# Patient Record
Sex: Female | Born: 1935
Health system: Southern US, Community
[De-identification: ages and names within clinical notes are randomized; demographics above are authoritative.]

## PROBLEM LIST (undated history)

## (undated) DIAGNOSIS — I255 Ischemic cardiomyopathy: Secondary | ICD-10-CM

## (undated) DIAGNOSIS — Z955 Presence of coronary angioplasty implant and graft: Secondary | ICD-10-CM

## (undated) DIAGNOSIS — I2510 Atherosclerotic heart disease of native coronary artery without angina pectoris: Secondary | ICD-10-CM

## (undated) DIAGNOSIS — I10 Essential (primary) hypertension: Secondary | ICD-10-CM

## (undated) DIAGNOSIS — K219 Gastro-esophageal reflux disease without esophagitis: Secondary | ICD-10-CM

## (undated) DIAGNOSIS — E785 Hyperlipidemia, unspecified: Secondary | ICD-10-CM

## (undated) DIAGNOSIS — K819 Cholecystitis, unspecified: Secondary | ICD-10-CM

## (undated) DIAGNOSIS — M1990 Unspecified osteoarthritis, unspecified site: Secondary | ICD-10-CM

## (undated) DIAGNOSIS — I219 Acute myocardial infarction, unspecified: Secondary | ICD-10-CM

## (undated) DIAGNOSIS — M810 Age-related osteoporosis without current pathological fracture: Secondary | ICD-10-CM

## (undated) HISTORY — DX: Hyperlipidemia, unspecified: E78.5

## (undated) HISTORY — PX: TONSILLECTOMY: SUR1361

## (undated) HISTORY — DX: Atherosclerotic heart disease of native coronary artery without angina pectoris: I25.10

## (undated) HISTORY — PX: APPENDECTOMY: SHX54

## (undated) HISTORY — PX: OTHER SURGICAL HISTORY: SHX169

## (undated) HISTORY — DX: Cholecystitis, unspecified: K81.9

## (undated) HISTORY — PX: LUMBAR LAMINECTOMY/ DECOMPRESSION WITH MET-RX: SHX5959

## (undated) HISTORY — DX: Unspecified osteoarthritis, unspecified site: M19.90

## (undated) HISTORY — PX: VAGINAL HYSTERECTOMY: SUR661

## (undated) HISTORY — DX: Age-related osteoporosis without current pathological fracture: M81.0

---

## 1999-06-20 ENCOUNTER — Encounter: Admission: RE | Admit: 1999-06-20 | Discharge: 1999-06-20 | Payer: Self-pay | Admitting: Internal Medicine

## 1999-06-20 ENCOUNTER — Encounter: Payer: Self-pay | Admitting: Internal Medicine

## 2000-07-21 ENCOUNTER — Ambulatory Visit (HOSPITAL_COMMUNITY): Admission: RE | Admit: 2000-07-21 | Discharge: 2000-07-21 | Payer: Self-pay | Admitting: Internal Medicine

## 2000-07-21 ENCOUNTER — Encounter: Payer: Self-pay | Admitting: Internal Medicine

## 2001-07-05 ENCOUNTER — Encounter: Payer: Self-pay | Admitting: Internal Medicine

## 2001-07-05 ENCOUNTER — Ambulatory Visit (HOSPITAL_COMMUNITY): Admission: RE | Admit: 2001-07-05 | Discharge: 2001-07-05 | Payer: Self-pay | Admitting: Internal Medicine

## 2002-08-30 ENCOUNTER — Ambulatory Visit (HOSPITAL_COMMUNITY): Admission: RE | Admit: 2002-08-30 | Discharge: 2002-08-30 | Payer: Self-pay | Admitting: Internal Medicine

## 2002-08-30 ENCOUNTER — Encounter: Payer: Self-pay | Admitting: Internal Medicine

## 2003-09-12 ENCOUNTER — Ambulatory Visit (HOSPITAL_COMMUNITY): Admission: RE | Admit: 2003-09-12 | Discharge: 2003-09-12 | Payer: Self-pay | Admitting: Internal Medicine

## 2004-09-18 ENCOUNTER — Ambulatory Visit (HOSPITAL_COMMUNITY): Admission: RE | Admit: 2004-09-18 | Discharge: 2004-09-18 | Payer: Self-pay | Admitting: Internal Medicine

## 2005-09-22 ENCOUNTER — Ambulatory Visit (HOSPITAL_COMMUNITY): Admission: RE | Admit: 2005-09-22 | Discharge: 2005-09-22 | Payer: Self-pay | Admitting: Internal Medicine

## 2006-10-21 ENCOUNTER — Ambulatory Visit (HOSPITAL_COMMUNITY): Admission: RE | Admit: 2006-10-21 | Discharge: 2006-10-21 | Payer: Self-pay | Admitting: Internal Medicine

## 2007-10-26 ENCOUNTER — Ambulatory Visit (HOSPITAL_COMMUNITY): Admission: RE | Admit: 2007-10-26 | Discharge: 2007-10-26 | Payer: Self-pay | Admitting: Internal Medicine

## 2008-11-21 ENCOUNTER — Ambulatory Visit (HOSPITAL_COMMUNITY): Admission: RE | Admit: 2008-11-21 | Discharge: 2008-11-21 | Payer: Self-pay | Admitting: Internal Medicine

## 2009-11-22 ENCOUNTER — Ambulatory Visit (HOSPITAL_COMMUNITY): Admission: RE | Admit: 2009-11-22 | Discharge: 2009-11-22 | Payer: Self-pay | Admitting: Internal Medicine

## 2010-11-14 ENCOUNTER — Other Ambulatory Visit (HOSPITAL_COMMUNITY): Payer: Self-pay | Admitting: Internal Medicine

## 2010-11-14 DIAGNOSIS — Z1231 Encounter for screening mammogram for malignant neoplasm of breast: Secondary | ICD-10-CM

## 2010-11-27 ENCOUNTER — Ambulatory Visit (HOSPITAL_COMMUNITY)
Admission: RE | Admit: 2010-11-27 | Discharge: 2010-11-27 | Disposition: A | Discharge status: Home / Self Care | Payer: MEDICARE | Source: Ambulatory Visit | Attending: Internal Medicine | Admitting: Internal Medicine

## 2010-11-27 DIAGNOSIS — Z1231 Encounter for screening mammogram for malignant neoplasm of breast: Secondary | ICD-10-CM | POA: Insufficient documentation

## 2011-11-19 ENCOUNTER — Other Ambulatory Visit (HOSPITAL_COMMUNITY): Payer: Self-pay | Admitting: Internal Medicine

## 2011-11-19 DIAGNOSIS — Z1231 Encounter for screening mammogram for malignant neoplasm of breast: Secondary | ICD-10-CM

## 2011-12-12 ENCOUNTER — Ambulatory Visit (HOSPITAL_COMMUNITY)
Admission: RE | Admit: 2011-12-12 | Discharge: 2011-12-12 | Disposition: A | Discharge status: Home / Self Care | Payer: Medicare Other | Source: Ambulatory Visit | Attending: Internal Medicine | Admitting: Internal Medicine

## 2011-12-12 DIAGNOSIS — Z1231 Encounter for screening mammogram for malignant neoplasm of breast: Secondary | ICD-10-CM | POA: Insufficient documentation

## 2012-12-01 ENCOUNTER — Other Ambulatory Visit (HOSPITAL_COMMUNITY): Payer: Self-pay | Admitting: Internal Medicine

## 2012-12-01 DIAGNOSIS — Z1231 Encounter for screening mammogram for malignant neoplasm of breast: Secondary | ICD-10-CM

## 2012-12-15 ENCOUNTER — Ambulatory Visit (HOSPITAL_COMMUNITY)
Admission: RE | Admit: 2012-12-15 | Discharge: 2012-12-15 | Disposition: A | Discharge status: Home / Self Care | Payer: Medicare HMO | Source: Ambulatory Visit | Attending: Internal Medicine | Admitting: Internal Medicine

## 2012-12-15 DIAGNOSIS — Z1231 Encounter for screening mammogram for malignant neoplasm of breast: Secondary | ICD-10-CM | POA: Insufficient documentation

## 2013-06-28 ENCOUNTER — Ambulatory Visit
Admission: RE | Admit: 2013-06-28 | Discharge: 2013-06-28 | Disposition: A | Discharge status: Home / Self Care | Payer: Medicare HMO | Source: Ambulatory Visit | Attending: Internal Medicine | Admitting: Internal Medicine

## 2013-06-28 ENCOUNTER — Other Ambulatory Visit: Payer: Self-pay | Admitting: Internal Medicine

## 2013-06-28 DIAGNOSIS — M545 Low back pain: Secondary | ICD-10-CM

## 2013-11-18 ENCOUNTER — Other Ambulatory Visit (HOSPITAL_COMMUNITY): Payer: Self-pay | Admitting: Internal Medicine

## 2013-11-18 DIAGNOSIS — Z1231 Encounter for screening mammogram for malignant neoplasm of breast: Secondary | ICD-10-CM

## 2013-12-22 ENCOUNTER — Ambulatory Visit (HOSPITAL_COMMUNITY)
Admission: RE | Admit: 2013-12-22 | Discharge: 2013-12-22 | Disposition: A | Discharge status: Home / Self Care | Payer: Medicare HMO | Source: Ambulatory Visit | Attending: Internal Medicine | Admitting: Internal Medicine

## 2013-12-22 DIAGNOSIS — Z1231 Encounter for screening mammogram for malignant neoplasm of breast: Secondary | ICD-10-CM | POA: Insufficient documentation

## 2015-01-12 ENCOUNTER — Other Ambulatory Visit: Payer: Self-pay | Admitting: Internal Medicine

## 2015-01-12 ENCOUNTER — Other Ambulatory Visit (HOSPITAL_COMMUNITY): Payer: Self-pay | Admitting: Internal Medicine

## 2015-01-12 DIAGNOSIS — E041 Nontoxic single thyroid nodule: Secondary | ICD-10-CM

## 2015-01-12 DIAGNOSIS — Z1231 Encounter for screening mammogram for malignant neoplasm of breast: Secondary | ICD-10-CM

## 2015-01-15 ENCOUNTER — Ambulatory Visit
Admission: RE | Admit: 2015-01-15 | Discharge: 2015-01-15 | Disposition: A | Discharge status: Home / Self Care | Payer: Medicare Other | Source: Ambulatory Visit | Attending: Internal Medicine | Admitting: Internal Medicine

## 2015-01-15 DIAGNOSIS — E041 Nontoxic single thyroid nodule: Secondary | ICD-10-CM

## 2015-01-19 ENCOUNTER — Ambulatory Visit (HOSPITAL_COMMUNITY)
Admission: RE | Admit: 2015-01-19 | Discharge: 2015-01-19 | Disposition: A | Discharge status: Home / Self Care | Payer: Medicare Other | Source: Ambulatory Visit | Attending: Internal Medicine | Admitting: Internal Medicine

## 2015-01-19 DIAGNOSIS — Z1231 Encounter for screening mammogram for malignant neoplasm of breast: Secondary | ICD-10-CM | POA: Diagnosis not present

## 2016-01-02 ENCOUNTER — Other Ambulatory Visit: Payer: Self-pay

## 2016-01-02 DIAGNOSIS — Z1231 Encounter for screening mammogram for malignant neoplasm of breast: Secondary | ICD-10-CM

## 2016-01-24 ENCOUNTER — Other Ambulatory Visit: Payer: Self-pay | Admitting: Internal Medicine

## 2016-01-24 DIAGNOSIS — E049 Nontoxic goiter, unspecified: Secondary | ICD-10-CM

## 2016-01-28 ENCOUNTER — Ambulatory Visit
Admission: RE | Admit: 2016-01-28 | Discharge: 2016-01-28 | Disposition: A | Discharge status: Home / Self Care | Payer: Medicare Other | Source: Ambulatory Visit | Attending: Internal Medicine | Admitting: Internal Medicine

## 2016-01-28 DIAGNOSIS — E049 Nontoxic goiter, unspecified: Secondary | ICD-10-CM

## 2016-02-11 ENCOUNTER — Ambulatory Visit: Payer: Medicare Other

## 2016-03-06 ENCOUNTER — Ambulatory Visit
Admission: RE | Admit: 2016-03-06 | Discharge: 2016-03-06 | Disposition: A | Discharge status: Home / Self Care | Payer: Medicare Other | Source: Ambulatory Visit

## 2016-03-06 DIAGNOSIS — Z1231 Encounter for screening mammogram for malignant neoplasm of breast: Secondary | ICD-10-CM

## 2017-02-24 ENCOUNTER — Other Ambulatory Visit: Payer: Self-pay | Admitting: Internal Medicine

## 2017-02-24 DIAGNOSIS — R634 Abnormal weight loss: Secondary | ICD-10-CM

## 2017-02-26 ENCOUNTER — Other Ambulatory Visit: Payer: Self-pay | Admitting: Internal Medicine

## 2017-02-26 DIAGNOSIS — Z1231 Encounter for screening mammogram for malignant neoplasm of breast: Secondary | ICD-10-CM

## 2017-03-02 ENCOUNTER — Ambulatory Visit
Admission: RE | Admit: 2017-03-02 | Discharge: 2017-03-02 | Disposition: A | Discharge status: Home / Self Care | Payer: Medicare Other | Source: Ambulatory Visit | Attending: Internal Medicine | Admitting: Internal Medicine

## 2017-03-02 DIAGNOSIS — R634 Abnormal weight loss: Secondary | ICD-10-CM

## 2017-03-02 MED ORDER — IOPAMIDOL (ISOVUE-300) INJECTION 61%
100.0000 mL | Freq: Once | INTRAVENOUS | Status: AC | PRN
  Administered 2017-03-02: 100 mL via INTRAVENOUS

## 2017-03-04 ENCOUNTER — Other Ambulatory Visit: Payer: Self-pay | Admitting: Internal Medicine

## 2017-03-04 DIAGNOSIS — R935 Abnormal findings on diagnostic imaging of other abdominal regions, including retroperitoneum: Secondary | ICD-10-CM

## 2017-03-10 ENCOUNTER — Ambulatory Visit
Admission: RE | Admit: 2017-03-10 | Discharge: 2017-03-10 | Disposition: A | Discharge status: Home / Self Care | Payer: Medicare Other | Source: Ambulatory Visit | Attending: Internal Medicine | Admitting: Internal Medicine

## 2017-03-10 DIAGNOSIS — R935 Abnormal findings on diagnostic imaging of other abdominal regions, including retroperitoneum: Secondary | ICD-10-CM

## 2017-03-11 DIAGNOSIS — Z955 Presence of coronary angioplasty implant and graft: Secondary | ICD-10-CM

## 2017-03-11 HISTORY — DX: Presence of coronary angioplasty implant and graft: Z95.5

## 2017-03-11 HISTORY — PX: OTHER SURGICAL HISTORY: SHX169

## 2017-03-17 DIAGNOSIS — I219 Acute myocardial infarction, unspecified: Secondary | ICD-10-CM

## 2017-03-17 HISTORY — DX: Acute myocardial infarction, unspecified: I21.9

## 2017-03-24 ENCOUNTER — Ambulatory Visit: Payer: Medicare Other

## 2017-03-31 ENCOUNTER — Ambulatory Visit (INDEPENDENT_AMBULATORY_CARE_PROVIDER_SITE_OTHER): Payer: Medicare Other | Admitting: Physician Assistant

## 2017-03-31 ENCOUNTER — Encounter: Payer: Self-pay | Admitting: Physician Assistant

## 2017-03-31 ENCOUNTER — Encounter (INDEPENDENT_AMBULATORY_CARE_PROVIDER_SITE_OTHER): Payer: Self-pay

## 2017-03-31 DIAGNOSIS — I255 Ischemic cardiomyopathy: Secondary | ICD-10-CM | POA: Insufficient documentation

## 2017-03-31 DIAGNOSIS — M1990 Unspecified osteoarthritis, unspecified site: Secondary | ICD-10-CM | POA: Insufficient documentation

## 2017-03-31 DIAGNOSIS — I2510 Atherosclerotic heart disease of native coronary artery without angina pectoris: Secondary | ICD-10-CM | POA: Diagnosis not present

## 2017-03-31 DIAGNOSIS — E785 Hyperlipidemia, unspecified: Secondary | ICD-10-CM | POA: Insufficient documentation

## 2017-03-31 DIAGNOSIS — M810 Age-related osteoporosis without current pathological fracture: Secondary | ICD-10-CM | POA: Insufficient documentation

## 2017-03-31 MED ORDER — LOSARTAN POTASSIUM 25 MG PO TABS: 25.0000 mg | ORAL_TABLET | Freq: Every day | ORAL | 3 refills | Status: DC

## 2017-03-31 MED ORDER — TICAGRELOR 90 MG PO TABS: 90.0000 mg | ORAL_TABLET | Freq: Two times a day (BID) | ORAL | 3 refills | Status: DC

## 2017-03-31 MED ORDER — METOPROLOL SUCCINATE ER 50 MG PO TB24
50.0000 mg | ORAL_TABLET | Freq: Every day | ORAL | 3 refills | Status: DC
Start: 2017-03-31 — End: 2018-01-23

## 2017-03-31 MED ORDER — ATORVASTATIN CALCIUM 80 MG PO TABS: 80.0000 mg | ORAL_TABLET | Freq: Every day | ORAL | 3 refills | Status: DC

## 2017-03-31 MED ORDER — NITROGLYCERIN 0.4 MG SL SUBL: 0.4000 mg | SUBLINGUAL_TABLET | SUBLINGUAL | 3 refills | Status: DC | PRN

## 2017-03-31 NOTE — Patient Instructions (Signed)
Medication Instructions:  Your physician recommends that you continue on your current medications as directed. Please refer to the Current Medication list given to you today.   Labwork: TODAY: BMET  Fasting labs in 6 weeks: lipids, liver  Testing/Procedures: It is requested that you have an echocardiogram MID SEPTEMBER. Echocardiography is a painless test that uses sound waves to create images of your heart. It provides your doctor with information about the size and shape of your heart and how well your heart's chambers and valves are working. This procedure takes approximately one hour. There are no restrictions for this procedure.   Follow-Up: You have been referred to CARDIAC REHAB.  Elon Jester recommends that you schedule a follow-up appointment in 2 months with Dr. Clifton James.  Any Other Special Instructions Will Be Listed Below (If Applicable).     If you need a refill on your cardiac medications before your next appointment, please call your pharmacy.

## 2017-03-31 NOTE — Progress Notes (Signed)
Cardiology Office Note    Date:  03/31/2017   ID:  Carrie Barnes, DOB 1936/07/05, MRN [id number]  PCP:  Renford Dills, MD  Cardiologist: New  Chief Complaint  Patient presents with  . New Patient (Initial Visit)    History of Present Illness:    Carrie Barnes is a 81 y.o. female who is being seen today for the evaluation of CAD at the request of Dr. Octavio Manns.  Patient had a NSTEMI with PCI/DES of an 80% LAD 03/17/17 [facility]. Review of cath report showed no residual disease in the circumflex or RCA with moderate LV dysfunction EF 35-40%. She was discharged home on aspirin, related to, most certain, metoprolol and statin. Discharge creatinine was 0.81. She has a past medical history of hyperlipidemia, osteoporosis and DJD.  Patient comes in today accompanied by her son who is a psychiatric nurse here in town. Her daughter is also a Engineer, civil (consulting) at [employer]. Patient has done quite well since her MI without any chest pain, dyspnea, dyspnea on exertion, dizziness or presyncope. She is asking much exercise she can do wonder if she should do cardiac rehabilitation. She actually feels like she is breathing easier and didn't realize she was having a problem before. She has had no symptoms of heart failure. She has no trouble with bleeding or melena. She quit smoking 35 years ago and her father had an MI at age 70. She lives independently and cooks for her family every Sunday.   Past Medical History:  Diagnosis Date  . Coronary artery disease   . DJD (degenerative joint disease)   . Hyperlipidemia   . Osteoporosis     History reviewed. No pertinent surgical history.  Current Medications: Current Meds  Medication Sig  . alendronate (FOSAMAX) 5 MG tablet Take 5 mg by mouth once a week. Take in the morning with a full glass of water, on an empty stomach, and do not take anything else by mouth or lie down for the next 30 min.  Marland Kitchen aspirin EC 81 MG tablet Take 1 tablet  (81 mg total) by mouth daily.  Marland Kitchen atorvastatin (LIPITOR) 80 MG tablet Take 80 mg by mouth daily.   Marland Kitchen losartan (COZAAR) 25 MG tablet Take 1 tablet (25 mg total) by mouth daily.  . metoprolol succinate (TOPROL-XL) 50 MG 24 hr tablet Take 50 mg by mouth daily.   . Multiple Vitamin (MULTI-VITAMINS) TABS Take 1 tablet by mouth daily.  . nitroGLYCERIN (NITROSTAT) 0.4 MG SL tablet   . Omega-3 Fatty Acids (FISH OIL) 1200 MG CAPS Take 1,200 mg by mouth daily.  Marland Kitchen OMEPRAZOLE PO Take 20.6 mg by mouth daily.  . ticagrelor (BRILINTA) 90 MG TABS tablet Take 1 tablet (90 mg total) by mouth every 12 (twelve) hours.     Allergies:   Patient has no known allergies.   Social History   Social History  . Marital status: Widowed    Spouse name: N/A  . Number of children: N/A  . Years of education: N/A   Social History Main Topics  . Smoking status: Former Games developer  . Smokeless tobacco: Never Used  . Alcohol use None  . Drug use: Unknown  . Sexual activity: Not Asked   Other Topics Concern  . None   Social History Narrative  . None     Family History:  The patient's   family history includes Breast cancer in her sister; Heart disease in her father; Pancreatic cancer  in her sister.   ROS:   Please see the history of present illness.    Review of Systems  Constitution: Negative.  HENT: Negative.   Eyes: Negative.   Cardiovascular: Negative.   Respiratory: Negative.   Hematologic/Lymphatic: Negative.   Musculoskeletal: Positive for arthritis. Negative for joint pain.  Gastrointestinal: Negative.   Genitourinary: Negative.   Neurological: Negative.    All other systems reviewed and are negative.   PHYSICAL EXAM:   VS:  BP 110/60   Pulse 64   Ht 5' 1.5" (1.562 m)   Wt 147 lb 3.2 oz (66.8 kg)   BMI 27.36 kg/m   Physical Exam  GEN: Well nourished, well developed, in no acute distress  Neck: no JVD, carotid bruits, or masses Cardiac:RRR; 1/6 systolic murmur at the left sternal  border Respiratory:  clear to auscultation bilaterally, normal work of breathing GI: soft, nontender, nondistended, + BS Ext: Right arm at cath site without hematoma or hemorrhage lower extremities without cyanosis, clubbing, or edema, Good distal pulses bilaterally Neuro:  Alert and Oriented x 3 Psych: euthymic mood, full affect  Wt Readings from Last 3 Encounters:  03/31/17 147 lb 3.2 oz (66.8 kg)      Studies/Labs Reviewed:   EKG:  EKG is  ordered today.  The ekg ordered today demonstrates Normal sinus rhythm with T wave inversion inferior and anterior lateral  Recent Labs: No results found for requested labs within last 8760 hours.   Lipid Panel No results found for: CHOL, TRIG, HDL, CHOLHDL, VLDL, LDLCALC, LDLDIRECT  Additional studies/ records that were reviewed today include:   Cardiac catheterization 03/17/17 [facility] SELECTIVE CORONARY ARTERIOGRAPHY: A) The right coronary is dominant, with a PDA and a large trifurcating PLV branch. The RCA is free of significant disease.    B) The left main coronary arises above the left coronary cusp the aortic valve and trifurcates left anterior descending, ramus intermedius, and circumflex. The left main is normal in caliber and free of disease.    C) The left anterior descending coronary is a modest vessel which reaches the cardiac apex. There is a 30% proximal LAD stenosis. There is a single moderate bifurcating proximal diagonal, and distal to this diagonal, there is a hazy 80 to 90% LAD stenosis. The remainder of the LAD has minor plaquing but no obstructive disease.    C) The circumflex consists of a small ramus intermedius, a large bifurcating OM1, and a moderate PLV branch. The circumflex is free of disease.   LEFT VENTRICULOGRAPHY: The left ventricle is normal in size, with a large area of anteroapical hypokinesis. The estimated LVEF is 35-40%. There is no MR noted on hand injection.   PERCUTANEOUS CORONARY  INTERVENTION AND DRUG-ELUTING STENT IMPLANTATION OF THE MID LEFT ANTERIOR DESCENDING: Following diagnostic catheterization, a 6-French sheath was used to replace the 5-French sheath, and this was aspirated and flushed. Heparin was given to maintain an ACT of greater than 300 seconds. A 0.014 Intuition wire was placed into the diagonal, and a 0.014 Whisper MS wire placed into the distal LAD. The vessel was pre-dilated with a 2.5 mm balloon, and then a 2.75 x 16 mm Promus Premier stent was positioned cineangiographically and deployed at 14 atmospheres for 30 seconds. Afterwards, there was no residual stenosis in the LAD with TIMI 3 antegrade blood flow. Final arteriograms were performed, the guiding catheter removed over a J-wire. The patient was transported to the recovery area in stable condition.   CONCLUSIONS:  1. Moderately reduced left ventricular systolic function as noted above. 2. Elevated left ventricular end-diastolic pressure (LVEDP). 3. Significant single-vessel coronary artery disease involving the mid left anterior descending, with no significant disease in the left circumflex or right coronary artery. 4. Successful percutaneous coronary intervention and drug-eluting stent implantation of the mid left anterior descending, with no residual stenosis following treatment and TIMI 3 blood flow pre and postprocedurally.     Dictated By: Octavio Manns, M.D.   ASSESSMENT:    1. CAD in native artery   2. Ischemic cardiomyopathy   3. Hyperlipidemia, unspecified hyperlipidemia type      PLAN:  In order of problems listed above:  CAD status post NSTEMI 03/17/17 [facility] treated with DES to the LAD. Normal RCA and circumflex. EF 35-40%. On Brilinta,aspirin, Beta blocker And statin. Overall doing very well. We'll refer to cardiac rehabilitation. Schedule follow-up with Dr.McAlhany in 2 months to be established long term.  Ischemic cardiomyopathy ejection fraction 35-40%.  Recommend follow-up echo in September. I suspect her EF is better than this. Continue losartan. Check renal function today.   Hyperlipidemia on statin will follow-up lipids and LFTs in 6 weeks.    Medication Adjustments/Labs and Tests Ordered: Current medicines are reviewed at length with the patient today.  Concerns regarding medicines are outlined above.  Medication changes, Labs and Tests ordered today are listed in the Patient Instructions below. There are no Patient Instructions on file for this visit.   Elson Clan, PA-C  03/31/2017 11:42 AM    [facility] [address] [place], [place], [place]  [address] Phone: [phone]; Fax: [phone]

## 2017-04-01 LAB — BASIC METABOLIC PANEL
BUN/Creatinine Ratio: 15 (ref 12–28)
BUN: 11 mg/dL (ref 8–27)
CO2: 27 mmol/L (ref 20–29)
Calcium: 9.9 mg/dL (ref 8.7–10.3)
Chloride: 95 mmol/L — ABNORMAL LOW (ref 96–106)
Creatinine, Ser: 0.73 mg/dL (ref 0.57–1.00)
GFR calc Af Amer: 91 mL/min/{1.73_m2} (ref >59)
GFR calc non Af Amer: 79 mL/min/{1.73_m2} (ref >59)
Glucose: 89 mg/dL (ref 65–99)
Potassium: 5.1 mmol/L (ref 3.5–5.2)
Sodium: 140 mmol/L (ref 134–144)

## 2017-04-08 ENCOUNTER — Telehealth: Payer: Self-pay | Admitting: Physician Assistant

## 2017-04-08 NOTE — Telephone Encounter (Signed)
New message      Pt was seen last week.  She still has not heard from cardiac rehab.  Calling to make sure we sent the referral to them.  Please call

## 2017-04-09 NOTE — Telephone Encounter (Signed)
Returned pts call and assured her that the referral for Cardiac Rehab has been placed.  I did provide pt with the phone # to call if she would like. She thanked me for the return call and said that she would give them call.

## 2017-04-15 ENCOUNTER — Telehealth (HOSPITAL_COMMUNITY): Payer: Self-pay | Admitting: Pharmacy Technician

## 2017-04-15 ENCOUNTER — Telehealth (HOSPITAL_COMMUNITY): Payer: Self-pay

## 2017-04-15 NOTE — Telephone Encounter (Signed)
Cardiac Rehab Medication Review by a Pharmacist  Does the patient  feel that his/her medications are working for him/her?  yes  Has the patient been experiencing any side effects to the medications prescribed?  no  Does the patient measure his/her own blood pressure or blood glucose at home?  no   Does the patient have any problems obtaining medications due to transportation or finances?   no  Understanding of regimen: good Understanding of indications: fair Potential of compliance: good    Pharmacist comments: Patient understands her medication regimen and was able to update many over the counter medications that she currently takes.  She does not currently monitor her BP at home but was favorable to starting.  She has no issues with affording her medications at this time, but did state the cost of Brilinta did impact her financially; She states being able to afford Brilinta for as long as it is indicated.   Daylene Posey, PharmD Pharmacy Resident Pager #: [phone] 04/15/2017 4:58 PM

## 2017-04-15 NOTE — Telephone Encounter (Signed)
Patient insurance is active and benefits verified. Patient insurance is [facility]- $20.00 co-payment, no deductible, out of pocket $6700/$520.59 has been met, no co-insurance and no pre-authorization. Passport/reference [phone].

## 2017-04-20 ENCOUNTER — Ambulatory Visit: Payer: Medicare Other | Admitting: Cardiovascular Disease

## 2017-04-21 ENCOUNTER — Ambulatory Visit
Admission: RE | Admit: 2017-04-21 | Discharge: 2017-04-21 | Disposition: A | Discharge status: Home / Self Care | Payer: Medicare Other | Source: Ambulatory Visit | Attending: Internal Medicine | Admitting: Internal Medicine

## 2017-04-21 DIAGNOSIS — Z1231 Encounter for screening mammogram for malignant neoplasm of breast: Secondary | ICD-10-CM

## 2017-04-23 ENCOUNTER — Encounter (HOSPITAL_COMMUNITY)
Admission: RE | Admit: 2017-04-23 | Discharge: 2017-04-23 | Disposition: A | Discharge status: Home / Self Care | Payer: Medicare Other | Source: Ambulatory Visit | Attending: Cardiovascular Disease | Admitting: Cardiovascular Disease

## 2017-04-23 ENCOUNTER — Encounter (HOSPITAL_COMMUNITY): Payer: Self-pay

## 2017-04-23 VITALS — BP 116/60 | HR 65 | Ht 63.0 in | Wt 139.6 lb

## 2017-04-23 DIAGNOSIS — Z955 Presence of coronary angioplasty implant and graft: Secondary | ICD-10-CM | POA: Diagnosis present

## 2017-04-23 DIAGNOSIS — Z79899 Other long term (current) drug therapy: Secondary | ICD-10-CM | POA: Insufficient documentation

## 2017-04-23 DIAGNOSIS — M810 Age-related osteoporosis without current pathological fracture: Secondary | ICD-10-CM | POA: Insufficient documentation

## 2017-04-23 DIAGNOSIS — I2510 Atherosclerotic heart disease of native coronary artery without angina pectoris: Secondary | ICD-10-CM | POA: Diagnosis not present

## 2017-04-23 DIAGNOSIS — I214 Non-ST elevation (NSTEMI) myocardial infarction: Secondary | ICD-10-CM | POA: Diagnosis present

## 2017-04-23 DIAGNOSIS — E785 Hyperlipidemia, unspecified: Secondary | ICD-10-CM | POA: Diagnosis not present

## 2017-04-23 DIAGNOSIS — Z87891 Personal history of nicotine dependence: Secondary | ICD-10-CM | POA: Diagnosis not present

## 2017-04-23 DIAGNOSIS — Z7982 Long term (current) use of aspirin: Secondary | ICD-10-CM | POA: Diagnosis not present

## 2017-04-23 NOTE — Progress Notes (Signed)
Carrie Barnes 81 y.o. female DOB: [birth year] MRN: [id number]      Nutrition Note  1. NSTEMI (non-ST elevated myocardial infarction) (HCC)   2. Status post coronary artery stent placement    Past Medical History:  Diagnosis Date  . Coronary artery disease   . DJD (degenerative joint disease)   . Hyperlipidemia   . Osteoporosis    Meds reviewed.  HT: Ht Readings from Last 1 Encounters:  03/31/17 5' 1.5" (1.562 m)    WT: Wt Readings from Last 3 Encounters:  03/31/17 147 lb 3.2 oz (66.8 kg)     BMI 27.4   Current tobacco use? No  Labs:  Lipid Panel  03/17/17 Labs Total chol  169 Trig   308 HDL   44 LDL   63 Hemoglobin A1c  5.5  CBG (last 3)  No results for input(s): GLUCAP in the last 72 hours.  Nutrition Note Spoke with pt. Nutrition plan and goals reviewed with pt. Pt is following Step 2 of the Therapeutic Lifestyle Changes diet. Pt wants to lose wt. Pt has been trying to lose wt by using the app LoseIt! app. Pt reports she has achieved her goal wt of 140 lb. Pt expressed understanding of the information reviewed. Pt aware of nutrition education classes offered.  Nutrition Diagnosis ? Food-and nutrition-related knowledge deficit related to lack of exposure to information as related to diagnosis of: ? CVD ? Overweight related to excessive energy intake as evidenced by a BMI of 27.4  Nutrition Intervention ? Pt's individual nutrition plan and goals reviewed with pt. ? Pt given handouts for: ? Nutrition I class ? Nutrition II class   Nutrition Goal(s):  ? Pt to describe the benefit of including fruits, vegetables, whole grains, and low-fat dairy products in a heart healthy meal plan.  Plan:  Will provide client-centered nutrition education as part of interdisciplinary care.   Monitor and evaluate progress toward nutrition goal with team.  Mickle Plumb, M.Ed, RD, LDN, CDE 04/23/2017 10:21 AM

## 2017-04-23 NOTE — Progress Notes (Signed)
Cardiac Individual Treatment Plan  Patient Details  Name: Carrie Barnes MRN: [id number] Date of Birth: 12-Dec-1935 Referring Provider:     CARDIAC REHAB PHASE II ORIENTATION from 04/23/2017 in [facility] CARDIAC REHAB  Referring Provider  Earney Hamburg MD      Initial Encounter Date:    CARDIAC REHAB PHASE II ORIENTATION from 04/23/2017 in [facility] CARDIAC REHAB  Date  04/23/17  Referring Provider  Earney Hamburg MD      Visit Diagnosis: NSTEMI (non-ST elevated myocardial infarction) [facility])  Status post coronary artery stent placement  Patient's Home Medications on Admission:  Current Outpatient Prescriptions:  .  alendronate (FOSAMAX) 70 MG tablet, Take 70 mg by mouth once a week. Take in the morning with a full glass of water, on an empty stomach, and do not take anything else by mouth or lie down for the next 30 min., Disp: , Rfl:  .  aspirin EC 81 MG tablet, Take 1 tablet (81 mg total) by mouth daily., Disp: , Rfl:  .  atorvastatin (LIPITOR) 80 MG tablet, Take 1 tablet (80 mg total) by mouth daily., Disp: 90 tablet, Rfl: 3 .  calcium carbonate (CALCIUM 600) 600 MG TABS tablet, Take 1,200 mg by mouth 2 (two) times daily with a meal., Disp: , Rfl:  .  cetirizine (ZYRTEC) 10 MG tablet, Take 10 mg by mouth daily as needed for allergies. , Disp: , Rfl:  .  Cholecalciferol (VITAMIN D3) 2000 units capsule, Take 2,000 Units by mouth daily., Disp: , Rfl:  .  CRANBERRY EXTRACT PO, Take 1 capsule by mouth daily., Disp: , Rfl:  .  losartan (COZAAR) 25 MG tablet, Take 1 tablet (25 mg total) by mouth daily., Disp: 90 tablet, Rfl: 3 .  metoprolol succinate (TOPROL-XL) 50 MG 24 hr tablet, Take 1 tablet (50 mg total) by mouth daily., Disp: 90 tablet, Rfl: 3 .  Multiple Vitamin (MULTI-VITAMINS) TABS, Take 1 tablet by mouth daily., Disp: , Rfl:  .  nitroGLYCERIN (NITROSTAT) 0.4 MG SL tablet, Place 1 tablet (0.4 mg total) under the tongue every 5 (five)  minutes as needed for chest pain., Disp: 25 tablet, Rfl: 3 .  Omega-3 Fatty Acids (FISH OIL) 1200 MG CAPS, Take 1,200 mg by mouth daily., Disp: , Rfl:  .  omeprazole (PRILOSEC) 20 MG capsule, Take 20 mg by mouth daily., Disp: , Rfl:  .  ticagrelor (BRILINTA) 90 MG TABS tablet, Take 1 tablet (90 mg total) by mouth 2 (two) times daily., Disp: 180 tablet, Rfl: 3 .  vitamin C (ASCORBIC ACID) 500 MG tablet, Take 500 mg by mouth daily., Disp: , Rfl:   Past Medical History: Past Medical History:  Diagnosis Date  . Coronary artery disease   . DJD (degenerative joint disease)   . Hyperlipidemia   . Osteoporosis     Tobacco Use: History  Smoking Status  . Former Smoker  Smokeless Tobacco  . Never Used    Labs: Recent Review Flowsheet Data    There is no flowsheet data to display.      Capillary Blood Glucose: No results found for: GLUCAP   Exercise Target Goals: Date: 04/23/17  Exercise Program Goal: Individual exercise prescription set with THRR, safety & activity barriers. Participant demonstrates ability to understand and report RPE using BORG scale, to self-measure pulse accurately, and to acknowledge the importance of the exercise prescription.  Exercise Prescription Goal: Starting with aerobic activity 30 plus minutes a day, 3 days per  week for initial exercise prescription. Provide home exercise prescription and guidelines that participant acknowledges understanding prior to discharge.  Activity Barriers & Risk Stratification:     Activity Barriers & Cardiac Risk Stratification - 04/23/17 0926      Activity Barriers & Cardiac Risk Stratification   Activity Barriers Deconditioning;Muscular Weakness;Back Problems   Cardiac Risk Stratification High      6 Minute Walk:     6 Minute Walk    Row Name 04/23/17 1217         6 Minute Walk   Phase Initial     Distance 1400 feet     Walk Time 6 minutes     # of Rest Breaks 0     METS 2.6     RPE 10     VO2 Peak  9.16     Symptoms No     Resting HR 65 bpm     Resting BP 116/60     Resting Oxygen Saturation  98 %     Exercise Oxygen Saturation  during 6 min walk 99 %     Max Ex. HR 94 bpm     Max Ex. BP 135/70     2 Minute Post BP 114/70        Oxygen Initial Assessment:   Oxygen Re-Evaluation:   Oxygen Discharge (Final Oxygen Re-Evaluation):   Initial Exercise Prescription:     Initial Exercise Prescription - 04/23/17 1200      Date of Initial Exercise RX and Referring Provider   Date 04/23/17   Referring Provider Earney Hamburg MD     Treadmill   MPH 2   Grade 1   Minutes 10   METs 2.81     Recumbant Bike   Level 1.5   Minutes 10   METs 2.3     NuStep   Level 2   SPM 70   Minutes 10   METs 2     Prescription Details   Frequency (times per week) 3   Duration Progress to 30 minutes of continuous aerobic without signs/symptoms of physical distress     Intensity   THRR 40-80% of Max Heartrate 56-113   Ratings of Perceived Exertion 11-13   Perceived Dyspnea 0-4     Progression   Progression Continue to progress workloads to maintain intensity without signs/symptoms of physical distress.     Resistance Training   Training Prescription Yes   Weight 2lbs   Reps 10-15      Perform Capillary Blood Glucose checks as needed.  Exercise Prescription Changes:   Exercise Comments:   Exercise Goals and Review:     Exercise Goals    Row Name 04/23/17 0933 04/23/17 1239           Exercise Goals   Increase Physical Activity Yes  -      Intervention Provide advice, education, support and counseling about physical activity/exercise needs.;Develop an individualized exercise prescription for aerobic and resistive training based on initial evaluation findings, risk stratification, comorbidities and participant's personal goals.  -      Expected Outcomes Achievement of increased cardiorespiratory fitness and enhanced flexibility, muscular endurance and strength  shown through measurements of functional capacity and personal statement of participant.  -      Increase Strength and Stamina Yes -  increase muscle tone, balance and flexibility      Intervention Provide advice, education, support and counseling about physical activity/exercise needs.;Develop an individualized exercise prescription for aerobic and resistive  training based on initial evaluation findings, risk stratification, comorbidities and participant's personal goals.  -      Expected Outcomes Achievement of increased cardiorespiratory fitness and enhanced flexibility, muscular endurance and strength shown through measurements of functional capacity and personal statement of participant.  -      Able to understand and use rate of perceived exertion (RPE) scale Yes  -      Intervention Provide education and explanation on how to use RPE scale  -      Expected Outcomes Short Term: Able to use RPE daily in rehab to express subjective intensity level;Long Term:  Able to use RPE to guide intensity level when exercising independently  -      Knowledge and understanding of Target Heart Rate Range (THRR) Yes  -      Intervention Provide education and explanation of THRR including how the numbers were predicted and where they are located for reference  -      Expected Outcomes Short Term: Able to state/look up THRR;Long Term: Able to use THRR to govern intensity when exercising independently;Short Term: Able to use daily as guideline for intensity in rehab  -      Able to check pulse independently Yes  -      Intervention Provide education and demonstration on how to check pulse in carotid and radial arteries.;Review the importance of being able to check your own pulse for safety during independent exercise  -      Expected Outcomes Short Term: Able to explain why pulse checking is important during independent exercise;Long Term: Able to check pulse independently and accurately  -      Understanding of  Exercise Prescription Yes  -      Intervention Provide education, explanation, and written materials on patient's individual exercise prescription  -      Expected Outcomes Short Term: Able to explain program exercise prescription;Long Term: Able to explain home exercise prescription to exercise independently  -         Exercise Goals Re-Evaluation :    Discharge Exercise Prescription (Final Exercise Prescription Changes):   Nutrition:  Target Goals: Understanding of nutrition guidelines, daily intake of sodium 1500mg , cholesterol 200mg , calories 30% from fat and 7% or less from saturated fats, daily to have 5 or more servings of fruits and vegetables.  Biometrics:     Pre Biometrics - 04/23/17 1223      Pre Biometrics   Waist Circumference 35.5 inches   Hip Circumference 39 inches   Waist to Hip Ratio 0.91 %   Triceps Skinfold 30 mm   % Body Fat 39 %   Grip Strength 22 kg   Flexibility 7.5 in   Single Leg Stand 8 seconds       Nutrition Therapy Plan and Nutrition Goals:     Nutrition Therapy & Goals - 04/23/17 1025      Nutrition Therapy   Diet Therapeutic Lifestyle Changes     Personal Nutrition Goals   Nutrition Goal Pt to describe the benefit of including fruits, vegetables, whole grains, and low-fat dairy products in a heart healthy meal plan.     Intervention Plan   Intervention Prescribe, educate and counsel regarding individualized specific dietary modifications aiming towards targeted core components such as weight, hypertension, lipid management, diabetes, heart failure and other comorbidities.   Expected Outcomes Short Term Goal: Understand basic principles of dietary content, such as calories, fat, sodium, cholesterol and nutrients.;Long Term Goal: Adherence to prescribed  nutrition plan.      Nutrition Discharge: Nutrition Scores:   Nutrition Goals Re-Evaluation:   Nutrition Goals Re-Evaluation:   Nutrition Goals Discharge (Final Nutrition  Goals Re-Evaluation):   Psychosocial: Target Goals: Acknowledge presence or absence of significant depression and/or stress, maximize coping skills, provide positive support system. Participant is able to verbalize types and ability to use techniques and skills needed for reducing stress and depression.  Initial Review & Psychosocial Screening:   Quality of Life Scores:     Quality of Life - 04/23/17 1225      Quality of Life Scores   Health/Function Pre 27.89 %   Socioeconomic Pre 27.71 %   Psych/Spiritual Pre 28.93 %   Family Pre 30 %   GLOBAL Pre 28.34 %      PHQ-9: Recent Review Flowsheet Data    There is no flowsheet data to display.     Interpretation of Total Score  Total Score Depression Severity:  1-4 = Minimal depression, 5-9 = Mild depression, 10-14 = Moderate depression, 15-19 = Moderately severe depression, 20-27 = Severe depression   Psychosocial Evaluation and Intervention:   Psychosocial Re-Evaluation:   Psychosocial Discharge (Final Psychosocial Re-Evaluation):   Vocational Rehabilitation: Provide vocational rehab assistance to qualifying candidates.   Vocational Rehab Evaluation & Intervention:   Education: Education Goals: Education classes will be provided on a weekly basis, covering required topics. Participant will state understanding/return demonstration of topics presented.  Learning Barriers/Preferences:     Learning Barriers/Preferences - 04/23/17 0919      Learning Barriers/Preferences   Learning Barriers Sight   Learning Preferences Computer/Internet;Written Material      Education Topics: Count Your Pulse:  -Group instruction provided by verbal instruction, demonstration, patient participation and written materials to support subject.  Instructors address importance of being able to find your pulse and how to count your pulse when at home without a heart monitor.  Patients get hands on experience counting their pulse with  staff help and individually.   Heart Attack, Angina, and Risk Factor Modification:  -Group instruction provided by verbal instruction, video, and written materials to support subject.  Instructors address signs and symptoms of angina and heart attacks.    Also discuss risk factors for heart disease and how to make changes to improve heart health risk factors.   Functional Fitness:  -Group instruction provided by verbal instruction, demonstration, patient participation, and written materials to support subject.  Instructors address safety measures for doing things around the house.  Discuss how to get up and down off the floor, how to pick things up properly, how to safely get out of a chair without assistance, and balance training.   Meditation and Mindfulness:  -Group instruction provided by verbal instruction, patient participation, and written materials to support subject.  Instructor addresses importance of mindfulness and meditation practice to help reduce stress and improve awareness.  Instructor also leads participants through a meditation exercise.    Stretching for Flexibility and Mobility:  -Group instruction provided by verbal instruction, patient participation, and written materials to support subject.  Instructors lead participants through series of stretches that are designed to increase flexibility thus improving mobility.  These stretches are additional exercise for major muscle groups that are typically performed during regular warm up and cool down.   Hands Only CPR:  -Group verbal, video, and participation provides a basic overview of AHA guidelines for community CPR. Role-play of emergencies allow participants the opportunity to practice calling for help and chest compression  technique with discussion of AED use.   Hypertension: -Group verbal and written instruction that provides a basic overview of hypertension including the most recent diagnostic guidelines, risk factor  reduction with self-care instructions and medication management.    Nutrition I class: Heart Healthy Eating:  -Group instruction provided by PowerPoint slides, verbal discussion, and written materials to support subject matter. The instructor gives an explanation and review of the Therapeutic Lifestyle Changes diet recommendations, which includes a discussion on lipid goals, dietary fat, sodium, fiber, plant stanol/sterol esters, sugar, and the components of a well-balanced, healthy diet.   CARDIAC REHAB PHASE II ORIENTATION from 04/23/2017 in [facility] CARDIAC REHAB  Date  04/23/17  Educator  RD  Instruction Review Code  Not applicable      Nutrition II class: Lifestyle Skills:  -Group instruction provided by PowerPoint slides, verbal discussion, and written materials to support subject matter. The instructor gives an explanation and review of label reading, grocery shopping for heart health, heart healthy recipe modifications, and ways to make healthier choices when eating out.   CARDIAC REHAB PHASE II ORIENTATION from 04/23/2017 in [facility] CARDIAC REHAB  Date  04/23/17  Educator  RD  Instruction Review Code  Not applicable      Diabetes Question & Answer:  -Group instruction provided by PowerPoint slides, verbal discussion, and written materials to support subject matter. The instructor gives an explanation and review of diabetes co-morbidities, pre- and post-prandial blood glucose goals, pre-exercise blood glucose goals, signs, symptoms, and treatment of hypoglycemia and hyperglycemia, and foot care basics.   Diabetes Blitz:  -Group instruction provided by PowerPoint slides, verbal discussion, and written materials to support subject matter. The instructor gives an explanation and review of the physiology behind type 1 and type 2 diabetes, diabetes medications and rational behind using different medications, pre- and post-prandial blood glucose  recommendations and Hemoglobin A1c goals, diabetes diet, and exercise including blood glucose guidelines for exercising safely.    Portion Distortion:  -Group instruction provided by PowerPoint slides, verbal discussion, written materials, and food models to support subject matter. The instructor gives an explanation of serving size versus portion size, changes in portions sizes over the last 20 years, and what consists of a serving from each food group.   Stress Management:  -Group instruction provided by verbal instruction, video, and written materials to support subject matter.  Instructors review role of stress in heart disease and how to cope with stress positively.     Exercising on Your Own:  -Group instruction provided by verbal instruction, power point, and written materials to support subject.  Instructors discuss benefits of exercise, components of exercise, frequency and intensity of exercise, and end points for exercise.  Also discuss use of nitroglycerin and activating EMS.  Review options of places to exercise outside of rehab.  Review guidelines for sex with heart disease.   Cardiac Drugs I:  -Group instruction provided by verbal instruction and written materials to support subject.  Instructor reviews cardiac drug classes: antiplatelets, anticoagulants, beta blockers, and statins.  Instructor discusses reasons, side effects, and lifestyle considerations for each drug class.   Cardiac Drugs II:  -Group instruction provided by verbal instruction and written materials to support subject.  Instructor reviews cardiac drug classes: angiotensin converting enzyme inhibitors (ACE-I), angiotensin II receptor blockers (ARBs), nitrates, and calcium channel blockers.  Instructor discusses reasons, side effects, and lifestyle considerations for each drug class.   Anatomy and Physiology of the Circulatory System:  Group verbal and written instruction and models provide basic cardiac anatomy  and physiology, with the coronary electrical and arterial systems. Review of: AMI, Angina, Valve disease, Heart Failure, Peripheral Artery Disease, Cardiac Arrhythmia, Pacemakers, and the ICD.   Other Education:  -Group or individual verbal, written, or video instructions that support the educational goals of the cardiac rehab program.   Knowledge Questionnaire Score:     Knowledge Questionnaire Score - 04/23/17 1215      Knowledge Questionnaire Score   Pre Score 22/24      Core Components/Risk Factors/Patient Goals at Admission:     Personal Goals and Risk Factors at Admission - 04/23/17 1225      Core Components/Risk Factors/Patient Goals on Admission   Heart Failure Yes   Intervention Provide a combined exercise and nutrition program that is supplemented with education, support and counseling about heart failure. Directed toward relieving symptoms such as shortness of breath, decreased exercise tolerance, and extremity edema.   Expected Outcomes Improve functional capacity of life;Short term: Attendance in program 2-3 days a week with increased exercise capacity. Reported lower sodium intake. Reported increased fruit and vegetable intake. Reports medication compliance.;Short term: Daily weights obtained and reported for increase. Utilizing diuretic protocols set by physician.;Long term: Adoption of self-care skills and reduction of barriers for early signs and symptoms recognition and intervention leading to self-care maintenance.   Lipids Yes   Intervention Provide education and support for participant on nutrition & aerobic/resistive exercise along with prescribed medications to achieve LDL 70mg , HDL >40mg .   Expected Outcomes Short Term: Participant states understanding of desired cholesterol values and is compliant with medications prescribed. Participant is following exercise prescription and nutrition guidelines.;Long Term: Cholesterol controlled with medications as prescribed,  with individualized exercise RX and with personalized nutrition plan. Value goals: LDL < , HDL > 40 mg.      Core Components/Risk Factors/Patient Goals Review:    Core Components/Risk Factors/Patient Goals at Discharge (Final Review):    ITP Comments:     ITP Comments    Row Name 04/23/17 0917           ITP Comments Medical Director, Dr. Armanda Magic          Comments: Patient attended orientation from 0800 to 1030 to review rules and guidelines for program. Completed 6 minute walk test, Intitial ITP, and exercise prescription.  VSS. Telemetry-Sr with T wave inversion which is present on most recent 12 lead ekg. Pt is asymptomatic with no complaints.  Brief Psychosocial Assessment reveals no immediate barriers to participating in cardiac rehab. Pt is looking forward to participating in cardiac rehab.  Alanson Aly, BSN Cardiac and Emergency planning/management officer

## 2017-04-27 ENCOUNTER — Encounter (HOSPITAL_COMMUNITY)
Admission: RE | Admit: 2017-04-27 | Discharge: 2017-04-27 | Disposition: A | Discharge status: Home / Self Care | Payer: Medicare Other | Source: Ambulatory Visit | Attending: Cardiovascular Disease | Admitting: Cardiovascular Disease

## 2017-04-27 ENCOUNTER — Encounter (HOSPITAL_COMMUNITY): Payer: Self-pay

## 2017-04-27 ENCOUNTER — Encounter (HOSPITAL_COMMUNITY): Payer: Medicare Other

## 2017-04-27 DIAGNOSIS — I214 Non-ST elevation (NSTEMI) myocardial infarction: Secondary | ICD-10-CM

## 2017-04-27 DIAGNOSIS — Z955 Presence of coronary angioplasty implant and graft: Secondary | ICD-10-CM

## 2017-04-27 NOTE — Progress Notes (Signed)
Daily Session Note  Patient Details  Name: Carrie Barnes MRN: [id number] Date of Birth: 06/23/1936 Referring Provider:     CARDIAC REHAB PHASE II ORIENTATION from 04/23/2017 in [place]  Referring Provider  Darlina Guys MD      Encounter Date: 04/27/2017  Check In:     Session Check In - 04/27/17 1620      Check-In   Location [facility]   Staff Present Maurice Small, RN, BSN;Joann Rion, RN, Marga Melnick, RN, BSN   Supervising physician immediately available to respond to emergencies [facility] immediately available   Physician(s) Dr. Carolin Sicks   Medication changes reported     No   Fall or balance concerns reported    No   Tobacco Cessation No Change   Warm-up and Cool-down Performed as group-led instruction   Resistance Training Performed No   VAD Patient? No     Pain Assessment   Currently in Pain? No/denies      Capillary Blood Glucose: No results found for this or any previous visit (from the past 24 hour(s)).    History  Smoking Status  . Former Smoker  Smokeless Tobacco  . Never Used    Goals Met:  Exercise tolerated well  Goals Unmet:  Not Applicable  Comments: [name] started cardiac rehab today.  Pt tolerated light exercise without difficulty. VSS, telemetry-Sinus Rhythm with an inverted t wave, this has been previously documented, asymptomatic.  Medication list reconciled. Pt denies barriers to medicaiton compliance.  PSYCHOSOCIAL ASSESSMENT:  PHQ-0. Pt exhibits positive coping skills, hopeful outlook with supportive family. No psychosocial needs identified at this time, no psychosocial interventions necessary.    Pt enjoys reading.   Pt oriented to exercise equipment and routine.    Understanding verbalized.Barnet Pall, RN,BSN 04/27/2017 4:21 PM  Dr. Fransico Him is Medical Director for Cardiac Rehab at [facility].

## 2017-04-28 ENCOUNTER — Other Ambulatory Visit: Payer: Self-pay

## 2017-04-28 ENCOUNTER — Ambulatory Visit (HOSPITAL_COMMUNITY): Payer: Medicare Other | Attending: Cardiology

## 2017-04-28 DIAGNOSIS — I252 Old myocardial infarction: Secondary | ICD-10-CM | POA: Insufficient documentation

## 2017-04-28 DIAGNOSIS — Z87891 Personal history of nicotine dependence: Secondary | ICD-10-CM | POA: Insufficient documentation

## 2017-04-28 DIAGNOSIS — E785 Hyperlipidemia, unspecified: Secondary | ICD-10-CM | POA: Insufficient documentation

## 2017-04-28 DIAGNOSIS — I255 Ischemic cardiomyopathy: Secondary | ICD-10-CM | POA: Insufficient documentation

## 2017-04-29 ENCOUNTER — Encounter (HOSPITAL_COMMUNITY)
Admission: RE | Admit: 2017-04-29 | Discharge: 2017-04-29 | Disposition: A | Discharge status: Home / Self Care | Payer: Medicare Other | Source: Ambulatory Visit | Attending: Cardiovascular Disease | Admitting: Cardiovascular Disease

## 2017-04-29 DIAGNOSIS — I214 Non-ST elevation (NSTEMI) myocardial infarction: Secondary | ICD-10-CM | POA: Diagnosis not present

## 2017-04-29 DIAGNOSIS — Z955 Presence of coronary angioplasty implant and graft: Secondary | ICD-10-CM

## 2017-05-01 ENCOUNTER — Encounter (HOSPITAL_COMMUNITY)
Admission: RE | Admit: 2017-05-01 | Discharge: 2017-05-01 | Disposition: A | Discharge status: Home / Self Care | Payer: Medicare Other | Source: Ambulatory Visit | Attending: Cardiovascular Disease | Admitting: Cardiovascular Disease

## 2017-05-01 DIAGNOSIS — Z955 Presence of coronary angioplasty implant and graft: Secondary | ICD-10-CM

## 2017-05-01 DIAGNOSIS — I214 Non-ST elevation (NSTEMI) myocardial infarction: Secondary | ICD-10-CM

## 2017-05-01 NOTE — Progress Notes (Signed)
Carrie Barnes 81 y.o. female DOB: [birth year] MRN: [id number]      Nutrition Note  1. Status post coronary artery stent placement   2. NSTEMI (non-ST elevated myocardial infarction) (HCC)   Labs:  Lipid Panel  03/17/17 Labs Total chol  169 Trig   308 HDL   44 LDL   63 Hemoglobin A1c  5.5  Nutrition Note Spoke with pt. Nutrition plan and survey reviewed with pt. Pt is following Step 2 of the Therapeutic Lifestyle Changes diet. Pt expressed understanding of the information reviewed. Pt aware of nutrition education classes offered.  Nutrition Diagnosis ? Food-and nutrition-related knowledge deficit related to lack of exposure to information as related to diagnosis of: ? CVD ? Overweight related to excessive energy intake as evidenced by a BMI of 27.4  Nutrition Intervention ? Pt's individual nutrition plan and goals reviewed with pt. ? Benefits of adopting Therapeutic Lifestyle Changes discussed when Medficts reviewed.    Nutrition Goal(s):  ? Pt to describe the benefit of including fruits, vegetables, whole grains, and low-fat dairy products in a heart healthy meal plan.  Plan:  Will provide client-centered nutrition education as part of interdisciplinary care.   Monitor and evaluate progress toward nutrition goal with team.  Carrie Barnes, M.Ed, RD, LDN, CDE 05/01/2017 10:55 AM

## 2017-05-04 ENCOUNTER — Encounter (HOSPITAL_COMMUNITY)
Admission: RE | Admit: 2017-05-04 | Discharge: 2017-05-04 | Disposition: A | Discharge status: Home / Self Care | Payer: Medicare Other | Source: Ambulatory Visit | Attending: Cardiovascular Disease | Admitting: Cardiovascular Disease

## 2017-05-04 DIAGNOSIS — Z955 Presence of coronary angioplasty implant and graft: Secondary | ICD-10-CM

## 2017-05-04 DIAGNOSIS — I214 Non-ST elevation (NSTEMI) myocardial infarction: Secondary | ICD-10-CM | POA: Diagnosis not present

## 2017-05-04 NOTE — Progress Notes (Signed)
Reviewed home exercise guidelines with patient including endpoints, temperature precautions, target heart rate and rate of perceived exertion. Pt plans to walk as her mode of home exercise. Pt voices understanding of instructions given. Rula [place] M [place], MS, ACSM CEP  

## 2017-05-06 ENCOUNTER — Encounter (HOSPITAL_COMMUNITY)
Admission: RE | Admit: 2017-05-06 | Discharge: 2017-05-06 | Disposition: A | Discharge status: Home / Self Care | Payer: Medicare Other | Source: Ambulatory Visit | Attending: Cardiovascular Disease | Admitting: Cardiovascular Disease

## 2017-05-06 DIAGNOSIS — I214 Non-ST elevation (NSTEMI) myocardial infarction: Secondary | ICD-10-CM

## 2017-05-06 DIAGNOSIS — Z955 Presence of coronary angioplasty implant and graft: Secondary | ICD-10-CM

## 2017-05-08 ENCOUNTER — Encounter (HOSPITAL_COMMUNITY)
Admission: RE | Admit: 2017-05-08 | Discharge: 2017-05-08 | Disposition: A | Discharge status: Home / Self Care | Payer: Medicare Other | Source: Ambulatory Visit | Attending: Cardiovascular Disease | Admitting: Cardiovascular Disease

## 2017-05-08 VITALS — BP 110/72 | HR 77 | Wt 147.9 lb

## 2017-05-08 DIAGNOSIS — I214 Non-ST elevation (NSTEMI) myocardial infarction: Secondary | ICD-10-CM

## 2017-05-08 DIAGNOSIS — Z955 Presence of coronary angioplasty implant and graft: Secondary | ICD-10-CM

## 2017-05-11 ENCOUNTER — Encounter (HOSPITAL_COMMUNITY): Payer: Medicare Other

## 2017-05-11 ENCOUNTER — Telehealth (HOSPITAL_COMMUNITY): Payer: Self-pay | Admitting: Internal Medicine

## 2017-05-12 ENCOUNTER — Other Ambulatory Visit: Payer: Medicare Other

## 2017-05-13 ENCOUNTER — Encounter (HOSPITAL_COMMUNITY): Payer: Medicare Other

## 2017-05-13 ENCOUNTER — Telehealth: Payer: Self-pay | Admitting: Physician Assistant

## 2017-05-13 NOTE — Telephone Encounter (Signed)
New Message  Pt call requesting to speak with RN. Pt states at her last hospital visit she was taken off of all of her medications for 3 days. Pt is concerned and would like to further discuss with the RN. Please call back to discuss

## 2017-05-13 NOTE — Telephone Encounter (Signed)
Pat, Do we need to do anything with this? It sounds like she is back on her cardiac meds. Thayer Ohm

## 2017-05-13 NOTE — Telephone Encounter (Signed)
Patient called wanting to make our office aware that she was discharged yesterday (Tuesday) from [facility] in [place]. She was admitted for an inflamed/infected gall bladder and was taken off all of her cardiac meds for 3 days ( brilinta, atorvastatin, losartan, metoprolol) d/t potential surgical treatment. Patient is now back on all medications and wanted to know if she needed to be seen by our office for this problem. Patient advised that she did need to be taking her medications and that our office don't treat gall bladder problems. Patient advised to keep her already scheduled follow up visit with Dr. Clifton James on 07/09/17 and to follow up with her surgeon about her gall bladder. Patient verbalized understanding.

## 2017-05-14 NOTE — Telephone Encounter (Signed)
I spoke with pt. She is back on her heart medications and states her heart situation is fine.  She has no questions.

## 2017-05-15 ENCOUNTER — Telehealth (HOSPITAL_COMMUNITY): Payer: Self-pay | Admitting: *Deleted

## 2017-05-15 ENCOUNTER — Encounter (HOSPITAL_COMMUNITY): Payer: Medicare Other

## 2017-05-17 NOTE — Progress Notes (Signed)
Cardiology Office Note    Date:  05/18/2017   ID:  Carrie Barnes, DOB 03-Dec-1935, MRN [id number]  PCP:  Renford Dills, MD  Cardiologist: Dr. Clifton James   Chief Complaint  Patient presents with  . Hospitalization Follow-up    History of Present Illness:    Carrie Barnes is a 81 y.o. female with past medical history of CAD (s/p DES placement to LAD in 03/2017), ischemic cardiomyopathy (EF 35-40%), HTN, HLD, and DJD who presents to the office today for hospital follow-up.   She was last examined by Jacolyn Reedy, PA-C as a new patient referral on 03/31/2017 following her recent MI at [facility]. She was doing well at that time, denying any chest pain or dyspnea on exertion. She was continued on ASA, Brilinta, statin, and BB therapy. A repeat echocardiogram was obtained on 04/28/2017 and showed an EF of 55-60% with Grade 1 DD and a trivial pericardial effusion.   In talking with the patient today, she reports being recently hospitalized at [facility] near [facility] for a "gallbladder attack". She reports they gave her IV fluids and antibiotics for several days and restricted her PO intake to ice chips. They did not perform surgical intervention due to her recent MI. She reports that Brilinta and Aspirin were held and that she received injections into her stomach (presumably full-dose Lovenox) until it could be confirmed she would not require surgery. She was discharged and informed to follow-up with her PCP. Labs were checked as an outpatient and showed an improved ALP of 133, AST 41, and ALT 85.  She reports significant improvement in her pain since the recent hospitalization. Remains on Amoxicillin with her last dose being tomorrow morning. Has a mild RUQ soreness but this is not exacerbated with food intake and is intermittent. She denies any recent chest discomfort leading up to her admission or since. No dyspnea on exertion, orthopnea, PND, lower extremity edema,  or palpitations. She resumed all of her cardiac medications at the time of hospital discharge and has been tolerating these well.   Her PCP has referred her to [facility] for further discussion of a cholecystectomy but there are no plans for intervention at this time.    Past Medical History:  Diagnosis Date  . Coronary artery disease    a. 03/2017: 80-90% LAD stenosis (PCI/DES placement with a 2.75x16 mm Promus Premier stent). No significant stenosis along RCA or LCx.   Marland Kitchen DJD (degenerative joint disease)   . Hyperlipidemia   . Osteoporosis     No past surgical history on file.  Current Medications: Outpatient Medications Prior to Visit  Medication Sig Dispense Refill  . alendronate (FOSAMAX) 70 MG tablet Take 70 mg by mouth once a week. Take in the morning with a full glass of water, on an empty stomach, and do not take anything else by mouth or lie down for the next 30 min.    Marland Kitchen aspirin EC 81 MG tablet Take 1 tablet (81 mg total) by mouth daily.    Marland Kitchen atorvastatin (LIPITOR) 80 MG tablet Take 1 tablet (80 mg total) by mouth daily. 90 tablet 3  . calcium carbonate (CALCIUM 600) 600 MG TABS tablet Take 1,200 mg by mouth 2 (two) times daily with a meal.    . cetirizine (ZYRTEC) 10 MG tablet Take 10 mg by mouth daily as needed for allergies.     . Cholecalciferol (VITAMIN D3) 2000 units capsule Take 2,000 Units by  mouth daily.    Marland Kitchen CRANBERRY EXTRACT PO Take 1 capsule by mouth daily.    Marland Kitchen losartan (COZAAR) 25 MG tablet Take 1 tablet (25 mg total) by mouth daily. 90 tablet 3  . metoprolol succinate (TOPROL-XL) 50 MG 24 hr tablet Take 1 tablet (50 mg total) by mouth daily. 90 tablet 3  . Multiple Vitamin (MULTI-VITAMINS) TABS Take 1 tablet by mouth daily.    . nitroGLYCERIN (NITROSTAT) 0.4 MG SL tablet Place 1 tablet (0.4 mg total) under the tongue every 5 (five) minutes as needed for chest pain. 25 tablet 3  . Omega-3 Fatty Acids (FISH OIL) 1200 MG CAPS Take 1,200 mg by mouth  daily.    Marland Kitchen omeprazole (PRILOSEC) 20 MG capsule Take 20 mg by mouth daily.    . ticagrelor (BRILINTA) 90 MG TABS tablet Take 1 tablet (90 mg total) by mouth 2 (two) times daily. 180 tablet 3  . vitamin C (ASCORBIC ACID) 500 MG tablet Take 500 mg by mouth daily.     No facility-administered medications prior to visit.      Allergies:   Patient has no known allergies.   Social History   Social History  . Marital status: Widowed    Spouse name: N/A  . Number of children: N/A  . Years of education: N/A   Social History Main Topics  . Smoking status: Former Games developer  . Smokeless tobacco: Never Used  . Alcohol use None  . Drug use: Unknown  . Sexual activity: Not Asked   Other Topics Concern  . None   Social History Narrative  . None     Family History:  The patient's family history includes Breast cancer in her sister; Heart disease in her father; Pancreatic cancer in her sister.   Review of Systems:   Please see the history of present illness.     General:  No chills, fever, night sweats or weight changes.  Cardiovascular:  No chest pain, dyspnea on exertion, edema, orthopnea, palpitations, paroxysmal nocturnal dyspnea. Dermatological: No rash, lesions/masses Respiratory: No cough, dyspnea Urologic: No hematuria, dysuria Abdominal:   No nausea, vomiting, diarrhea, bright red blood per rectum, melena, or hematemesis. Positive for abdominal pain (now improved).  Neurologic:  No visual changes, wkns, changes in mental status. All other systems reviewed and are otherwise negative except as noted above.   Physical Exam:    VS:  BP 115/72   Pulse 74   Ht 5' 1.5" (1.562 m)   Wt 143 lb 6.4 oz (65 kg)   SpO2 98%   BMI 26.66 kg/m    General: Well developed, well nourished Caucasian female appearing in no acute distress. Head: Normocephalic, atraumatic, sclera non-icteric, no xanthomas, nares are without discharge.  Neck: No carotid bruits. JVD not elevated.  Lungs:  Respirations regular and unlabored, without wheezes or rales.  Heart: Regular rate and rhythm. No S3 or S4.  No murmur, no rubs, or gallops appreciated. Abdomen: Soft, non-tender, non-distended with normoactive bowel sounds. No hepatomegaly. No rebound/guarding. No obvious abdominal masses. Msk:  Strength and tone appear normal for age. No joint deformities or effusions. Extremities: No clubbing or cyanosis. No lower extremity edema.  Distal pedal pulses are 2+ bilaterally. Neuro: Alert and oriented X 3. Moves all extremities spontaneously. No focal deficits noted. Psych:  Responds to questions appropriately with a normal affect. Skin: No rashes or lesions noted  Wt Readings from Last 3 Encounters:  05/18/17 143 lb 6.4 oz (65 kg)  04/23/17 139  lb 8.8 oz (63.3 kg)  03/31/17 147 lb 3.2 oz (66.8 kg)     Studies/Labs Reviewed:   EKG:  EKG is not ordered today.   Recent Labs: 03/31/2017: BUN 11; Creatinine, Ser 0.73; Potassium 5.1; Sodium 140   Lipid Panel No results found for: CHOL, TRIG, HDL, CHOLHDL, VLDL, LDLCALC, LDLDIRECT  Additional studies/ records that were reviewed today include:   Echocardiogram: 04/28/2017 Study Conclusions  - Left ventricle: The cavity size was normal. Wall thickness was   increased in a pattern of mild LVH. Systolic function was normal.   The estimated ejection fraction was in the range of 55% to 60%.   Wall motion was normal; there were no regional wall motion   abnormalities. Doppler parameters are consistent with abnormal   left ventricular relaxation (grade 1 diastolic dysfunction).   Doppler parameters are consistent with high ventricular filling   pressure. - Mitral valve: Calcified annulus. There was mild regurgitation. - Pericardium, extracardiac: A trivial pericardial effusion was   identified.  Impressions:  - Normal LV systolic function; mild diastolic dysfunction; elevated   LV filling pressure; mild LVH; mild MR; mild  TR.  Cardiac Catheterization: 03/2017 HEMODYNAMICS: The aortic pressure is 110/70 with a mean of 87 mmHg. The LV pressure is 110 over an end-diastolic pressure of 28 mmHg. There is no transaortic gradient.  SELECTIVE CORONARY ARTERIOGRAPHY: A) The right coronary is dominant, with a PDA and a large trifurcating PLV branch. The RCA is free of significant disease.   B) The left main coronary arises above the left coronary cusp the aortic valve and trifurcates left anterior descending, ramus intermedius, and circumflex. The left main is normal in caliber and free of disease.   C) The left anterior descending coronary is a modest vessel which reaches the cardiac apex. There is a 30% proximal LAD stenosis. There is a single moderate bifurcating proximal diagonal, and distal to this diagonal, there is a hazy 80 to 90% LAD stenosis. The remainder of the LAD has minor plaquing but no obstructive disease.   C) The circumflex consists of a small ramus intermedius, a large bifurcating OM1, and a moderate PLV branch. The circumflex is free of disease.  LEFT VENTRICULOGRAPHY: The left ventricle is normal in size, with a large area of anteroapical hypokinesis. The estimated LVEF is 35-40%. There is no MR noted on hand injection.  PERCUTANEOUS CORONARY INTERVENTION AND DRUG-ELUTING STENT IMPLANTATION OF THE MID LEFT ANTERIOR DESCENDING: Following diagnostic catheterization, a 6-French sheath was used to replace the 5-French sheath, and this was aspirated and flushed. Heparin was given to maintain an ACT of greater than 300 seconds. A 0.014 Intuition wire was placed into the diagonal, and a 0.014 Whisper MS wire placed into the distal LAD. The vessel was pre-dilated with a 2.5 mm balloon, and then a 2.75 x 16 mm Promus Premier stent was positioned cineangiographically and deployed at 14 atmospheres for 30 seconds. Afterwards, there was no residual stenosis in the LAD with TIMI 3 antegrade blood flow. Final  arteriograms were performed, the guiding catheter removed over a J-wire. The patient was transported to the recovery area in stable condition.  CONCLUSIONS: 1. Moderately reduced left ventricular systolic function as noted above. 2. Elevated left ventricular end-diastolic pressure (LVEDP). 3. Significant single-vessel coronary artery disease involving the mid left anterior descending, with no significant disease in the left circumflex or right coronary artery. 4. Successful percutaneous coronary intervention and drug-eluting stent implantation of the mid left anterior descending, with no  residual stenosis following treatment and TIMI 3 blood flow pre and postprocedurally.    Assessment:    1. Coronary artery disease involving native coronary artery of native heart without angina pectoris   2. Ischemic cardiomyopathy   3. Hyperlipidemia LDL goal <70   4. Calculus of gallbladder with cholecystitis without biliary obstruction, unspecified cholecystitis acuity      Plan:   In order of problems listed above:  1. CAD - she recently underwent DES placement to the LAD in 03/2017 in the setting of an NSTEMI. - she denies any recent chest pain or dyspnea on exertion.  - continue on DAPT with ASA and Brilinta along with BB and statin therapy.   2. Ischemic Cardiomyopathy - EF 35-40% at the time of her recent MI in 03/2017. Repeat echocardiogram showed an improved EF of EF of 55-60% with Grade 1 DD. - she does not appear volume overloaded by physical examination. Continue Toprol-XL  daily and Losartan  daily.   3. HLD - remains on Lipitor  daily. Followed by PCP. LDL goal is < 70 with known CAD.   4. Cholelithiasis - recently admitted for acute cholecystitis which was treated conservatively with IVF and antibiotics due to her recent NSTEMI. She reports mild RUQ pain but significant improvement in her symptoms since hospital discharge. ALP down to 133 on most recent check.  -  she has been referred to general surgery here in [facility] for further evaluation regarding a cholecystectomy. I informed the patient and her daughter that if the procedure is to be elective and is not necessary at this current time, then we would prefer to wait until she is at least 6 months out from recent stent placement to reduce the risk of in-stent thrombosis.    Medication Adjustments/Labs and Tests Ordered: Current medicines are reviewed at length with the patient today.  Concerns regarding medicines are outlined above.  Medication changes, Labs and Tests ordered today are listed in the Patient Instructions below. Patient Instructions  Medication Instructions: Your physician recommends that you continue on your current medications as directed. Please refer to the Current Medication list given to you today.  Follow-Up: Please keep you follow-up appointment with Dr. Clifton James as listed below.  Please have your surgeon send [place] updates on your plan of care. Our fax number is [phone].  If you need a refill on your cardiac medications before your next appointment, please call your pharmacy.   Signed, Ellsworth Lennox, PA-C  05/18/2017 6:46 PM    [facility] [address] [place], [address] [place], [place]  [address] Phone: [phone]; Fax: [phone]  [address], [address] [place], [place] [address] Phone: [phone]

## 2017-05-18 ENCOUNTER — Ambulatory Visit (INDEPENDENT_AMBULATORY_CARE_PROVIDER_SITE_OTHER): Payer: Medicare Other | Admitting: Student

## 2017-05-18 ENCOUNTER — Encounter (HOSPITAL_COMMUNITY): Payer: Medicare Other

## 2017-05-18 ENCOUNTER — Encounter: Payer: Self-pay | Admitting: Student

## 2017-05-18 VITALS — BP 115/72 | HR 74 | Ht 61.5 in | Wt 143.4 lb

## 2017-05-18 DIAGNOSIS — K8010 Calculus of gallbladder with chronic cholecystitis without obstruction: Secondary | ICD-10-CM | POA: Diagnosis not present

## 2017-05-18 DIAGNOSIS — I255 Ischemic cardiomyopathy: Secondary | ICD-10-CM

## 2017-05-18 DIAGNOSIS — E785 Hyperlipidemia, unspecified: Secondary | ICD-10-CM

## 2017-05-18 DIAGNOSIS — I2510 Atherosclerotic heart disease of native coronary artery without angina pectoris: Secondary | ICD-10-CM

## 2017-05-18 DIAGNOSIS — K8020 Calculus of gallbladder without cholecystitis without obstruction: Secondary | ICD-10-CM | POA: Insufficient documentation

## 2017-05-18 NOTE — Patient Instructions (Signed)
Medication Instructions: Your physician recommends that you continue on your current medications as directed. Please refer to the Current Medication list given to you today.   Follow-Up: Please keep you follow-up appointment with Dr. Clifton James as listed below.  Please have your surgeon send [place] updates on your plan of care. Our fax number is [phone].  If you need a refill on your cardiac medications before your next appointment, please call your pharmacy.

## 2017-05-19 ENCOUNTER — Encounter (HOSPITAL_COMMUNITY): Payer: Self-pay | Admitting: *Deleted

## 2017-05-19 DIAGNOSIS — I214 Non-ST elevation (NSTEMI) myocardial infarction: Secondary | ICD-10-CM

## 2017-05-19 DIAGNOSIS — Z955 Presence of coronary angioplasty implant and graft: Secondary | ICD-10-CM

## 2017-05-19 NOTE — Progress Notes (Signed)
Cardiac Individual Treatment Plan  Patient Details  Name: MYRICAL ANDUJO MRN: [id number] Date of Birth: 02-Sep-1935 Referring Provider:     CARDIAC REHAB PHASE II ORIENTATION from 04/23/2017 in [place]  Referring Provider  Darlina Guys MD      Initial Encounter Date:    CARDIAC REHAB PHASE II ORIENTATION from 04/23/2017 in [place]  Date  04/23/17  Referring Provider  Darlina Guys MD      Visit Diagnosis: Status post coronary artery stent placement  NSTEMI (non-ST elevated myocardial infarction) ([place])  Patient's Home Medications on Admission:  Current Outpatient Prescriptions:  .  alendronate (FOSAMAX) 70 MG tablet, Take 70 mg by mouth once a week. Take in the morning with a full glass of water, on an empty stomach, and do not take anything else by mouth or lie down for the next 30 min., Disp: , Rfl:  .  amoxicillin-clavulanate (AUGMENTIN) 875-125 MG tablet, Take 1 tablet by mouth 2 (two) times daily., Disp: , Rfl:  .  aspirin EC 81 MG tablet, Take 1 tablet (81 mg total) by mouth daily., Disp: , Rfl:  .  atorvastatin (LIPITOR) 80 MG tablet, Take 1 tablet (80 mg total) by mouth daily., Disp: 90 tablet, Rfl: 3 .  calcium carbonate (CALCIUM 600) 600 MG TABS tablet, Take 1,200 mg by mouth 2 (two) times daily with a meal., Disp: , Rfl:  .  cetirizine (ZYRTEC) 10 MG tablet, Take 10 mg by mouth daily as needed for allergies. , Disp: , Rfl:  .  Cholecalciferol (VITAMIN D3) 2000 units capsule, Take 2,000 Units by mouth daily., Disp: , Rfl:  .  CRANBERRY EXTRACT PO, Take 1 capsule by mouth daily., Disp: , Rfl:  .  losartan (COZAAR) 25 MG tablet, Take 1 tablet (25 mg total) by mouth daily., Disp: 90 tablet, Rfl: 3 .  metoprolol succinate (TOPROL-XL) 50 MG 24 hr tablet, Take 1 tablet (50 mg total) by mouth daily., Disp: 90 tablet, Rfl: 3 .  Multiple Vitamin (MULTI-VITAMINS) TABS, Take 1 tablet by mouth daily., Disp: , Rfl:   .  nitroGLYCERIN (NITROSTAT) 0.4 MG SL tablet, Place 1 tablet (0.4 mg total) under the tongue every 5 (five) minutes as needed for chest pain., Disp: 25 tablet, Rfl: 3 .  Omega-3 Fatty Acids (FISH OIL) 1200 MG CAPS, Take 1,200 mg by mouth daily., Disp: , Rfl:  .  omeprazole (PRILOSEC) 20 MG capsule, Take 20 mg by mouth daily., Disp: , Rfl:  .  ticagrelor (BRILINTA) 90 MG TABS tablet, Take 1 tablet (90 mg total) by mouth 2 (two) times daily., Disp: 180 tablet, Rfl: 3 .  vitamin C (ASCORBIC ACID) 500 MG tablet, Take 500 mg by mouth daily., Disp: , Rfl:   Past Medical History: Past Medical History:  Diagnosis Date  . Coronary artery disease    a. 03/2017: 80-90% LAD stenosis (PCI/DES placement with a 2.75x16 mm Promus Premier stent). No significant stenosis along RCA or LCx.   Marland Kitchen DJD (degenerative joint disease)   . Hyperlipidemia   . Osteoporosis     Tobacco Use: History  Smoking Status  . Former Smoker  Smokeless Tobacco  . Never Used    Labs: Recent Review Flowsheet Data    There is no flowsheet data to display.      Capillary Blood Glucose: No results found for: GLUCAP   Exercise Target Goals:    Exercise Program Goal: Individual exercise prescription set with THRR,  safety & activity barriers. Participant demonstrates ability to understand and report RPE using BORG scale, to self-measure pulse accurately, and to acknowledge the importance of the exercise prescription.  Exercise Prescription Goal: Starting with aerobic activity 30 plus minutes a day, 3 days per week for initial exercise prescription. Provide home exercise prescription and guidelines that participant acknowledges understanding prior to discharge.  Activity Barriers & Risk Stratification:     Activity Barriers & Cardiac Risk Stratification - 04/23/17 0926      Activity Barriers & Cardiac Risk Stratification   Activity Barriers Deconditioning;Muscular Weakness;Back Problems   Cardiac Risk  Stratification High      6 Minute Walk:     6 Minute Walk    Row Name 04/23/17 1217         6 Minute Walk   Phase Initial     Distance 1400 feet     Walk Time 6 minutes     # of Rest Breaks 0     METS 2.6     RPE 10     VO2 Peak 9.16     Symptoms No     Resting HR 65 bpm     Resting BP 116/60     Resting Oxygen Saturation  98 %     Exercise Oxygen Saturation  during 6 min walk 99 %     Max Ex. HR 94 bpm     Max Ex. BP 135/70     2 Minute Post BP 114/70        Oxygen Initial Assessment:   Oxygen Re-Evaluation:   Oxygen Discharge (Final Oxygen Re-Evaluation):   Initial Exercise Prescription:     Initial Exercise Prescription - 04/23/17 1200      Date of Initial Exercise RX and Referring Provider   Date 04/23/17   Referring Provider Darlina Guys MD     Treadmill   MPH 2   Grade 1   Minutes 10   METs 2.81     Recumbant Bike   Level 1.5   Minutes 10   METs 2.3     NuStep   Level 2   SPM 70   Minutes 10   METs 2     Prescription Details   Frequency (times per week) 3   Duration Progress to 30 minutes of continuous aerobic without signs/symptoms of physical distress     Intensity   THRR 40-80% of Max Heartrate 56-113   Ratings of Perceived Exertion 11-13   Perceived Dyspnea 0-4     Progression   Progression Continue to progress workloads to maintain intensity without signs/symptoms of physical distress.     Resistance Training   Training Prescription Yes   Weight 2lbs   Reps 10-15      Perform Capillary Blood Glucose checks as needed.  Exercise Prescription Changes:     Exercise Prescription Changes    Row Name 05/04/17 1700 05/08/17 0951           Response to Exercise   Blood Pressure (Admit) 122/82 110/72      Blood Pressure (Exercise) 140/64 128/80      Blood Pressure (Exit) 118/66 108/78      Heart Rate (Admit) 72 bpm 77 bpm      Heart Rate (Exercise) 101 bpm 111 bpm      Heart Rate (Exit) 65 bpm 75 bpm      Rating  of Perceived Exertion (Exercise) 11 11      Symptoms none none  Comments Off to a good start with exercise.  -      Duration Progress to 30 minutes of  aerobic without signs/symptoms of physical distress Progress to 30 minutes of  aerobic without signs/symptoms of physical distress      Intensity THRR unchanged THRR unchanged        Progression   Progression Continue to progress workloads to maintain intensity without signs/symptoms of physical distress. Continue to progress workloads to maintain intensity without signs/symptoms of physical distress.      Average METs 2.6 3.1        Resistance Training   Training Prescription Yes Yes      Weight 3lbs 3lbs      Reps 10-15 10-15      Time 10 Minutes 10 Minutes        Interval Training   Interval Training No No        Treadmill   MPH 2 2      Grade 1 1      Minutes 10 10      METs 2.81 2.81        Bike   Level 0.5 1      Minutes 10 10      METs 2.4 3.79        Recumbant Bike   Level -  -      Minutes -  -      METs -  -        NuStep   Level 3 3      SPM 85 85      Minutes 10 10      METs 2.5 2.6        Home Exercise Plan   Plans to continue exercise at Home (comment) Home (comment)      Frequency Add 2 additional days to program exercise sessions. Add 2 additional days to program exercise sessions.      Initial Home Exercises Provided 05/04/17 05/04/17         Exercise Comments:     Exercise Comments    Row Name 05/04/17 1000 05/06/17 1210 05/19/17 1612       Exercise Comments Reviewed home exercise goals with patient. Reviewed METs with patient. Patient has been out since 05/08/17 due to health issues.        Exercise Goals and Review:     Exercise Goals    Row Name 04/23/17 0933 04/23/17 1239           Exercise Goals   Increase Physical Activity Yes  -      Intervention Provide advice, education, support and counseling about physical activity/exercise needs.;Develop an individualized exercise  prescription for aerobic and resistive training based on initial evaluation findings, risk stratification, comorbidities and participant's personal goals.  -      Expected Outcomes Achievement of increased cardiorespiratory fitness and enhanced flexibility, muscular endurance and strength shown through measurements of functional capacity and personal statement of participant.  -      Increase Strength and Stamina Yes -  increase muscle tone, balance and flexibility      Intervention Provide advice, education, support and counseling about physical activity/exercise needs.;Develop an individualized exercise prescription for aerobic and resistive training based on initial evaluation findings, risk stratification, comorbidities and participant's personal goals.  -      Expected Outcomes Achievement of increased cardiorespiratory fitness and enhanced flexibility, muscular endurance and strength shown through measurements of functional capacity and personal statement of participant.  -  Able to understand and use rate of perceived exertion (RPE) scale Yes  -      Intervention Provide education and explanation on how to use RPE scale  -      Expected Outcomes Short Term: Able to use RPE daily in rehab to express subjective intensity level;Long Term:  Able to use RPE to guide intensity level when exercising independently  -      Knowledge and understanding of Target Heart Rate Range (THRR) Yes  -      Intervention Provide education and explanation of THRR including how the numbers were predicted and where they are located for reference  -      Expected Outcomes Short Term: Able to state/look up THRR;Long Term: Able to use THRR to govern intensity when exercising independently;Short Term: Able to use daily as guideline for intensity in rehab  -      Able to check pulse independently Yes  -      Intervention Provide education and demonstration on how to check pulse in carotid and radial arteries.;Review the  importance of being able to check your own pulse for safety during independent exercise  -      Expected Outcomes Short Term: Able to explain why pulse checking is important during independent exercise;Long Term: Able to check pulse independently and accurately  -      Understanding of Exercise Prescription Yes  -      Intervention Provide education, explanation, and written materials on patient's individual exercise prescription  -      Expected Outcomes Short Term: Able to explain program exercise prescription;Long Term: Able to explain home exercise prescription to exercise independently  -         Exercise Goals Re-Evaluation :     Exercise Goals Re-Evaluation    Row Name 05/06/17 1210 05/19/17 1612           Exercise Goal Re-Evaluation   Exercise Goals Review Understanding of Exercise Prescription;Knowledge and understanding of Target Heart Rate Range (THRR);Able to understand and use rate of perceived exertion (RPE) scale Increase Physical Activity      Comments Reviewed home exercise guidelines on 05/04/17, including THRR, RPE scale, MET level, and stop signs for exercise. Patient has been out since 05/08/17 due to health issues, therefore unable to reviewed METs and goals.      Expected Outcomes Patient plans to walk on the days that she doesn't attend cardiac rehab. Continue exercise program once health issues are resolved and patient returns to [facility].          Discharge Exercise Prescription (Final Exercise Prescription Changes):     Exercise Prescription Changes - 05/08/17 0951      Response to Exercise   Blood Pressure (Admit) 110/72   Blood Pressure (Exercise) 128/80   Blood Pressure (Exit) 108/78   Heart Rate (Admit) 77 bpm   Heart Rate (Exercise) 111 bpm   Heart Rate (Exit) 75 bpm   Rating of Perceived Exertion (Exercise) 11   Symptoms none   Duration Progress to 30 minutes of  aerobic without signs/symptoms of physical distress   Intensity THRR unchanged      Progression   Progression Continue to progress workloads to maintain intensity without signs/symptoms of physical distress.   Average METs 3.1     Resistance Training   Training Prescription Yes   Weight 3lbs   Reps 10-15   Time 10 Minutes     Interval Training   Interval Training No  Treadmill   MPH 2   Grade 1   Minutes 10   METs 2.81     Bike   Level 1   Minutes 10   METs 3.79     NuStep   Level 3   SPM 85   Minutes 10   METs 2.6     Home Exercise Plan   Plans to continue exercise at Home (comment)   Frequency Add 2 additional days to program exercise sessions.   Initial Home Exercises Provided 05/04/17      Nutrition:  Target Goals: Understanding of nutrition guidelines, daily intake of sodium <1559m, cholesterol <2033m calories 30% from fat and 7% or less from saturated fats, daily to have 5 or more servings of fruits and vegetables.  Biometrics:     Pre Biometrics - 04/23/17 1223      Pre Biometrics   Waist Circumference 35.5 inches   Hip Circumference 39 inches   Waist to Hip Ratio 0.91 %   Triceps Skinfold 30 mm   % Body Fat 39 %   Grip Strength 22 kg   Flexibility 7.5 in   Single Leg Stand 8 seconds       Nutrition Therapy Plan and Nutrition Goals:     Nutrition Therapy & Goals - 04/23/17 1025      Nutrition Therapy   Diet Therapeutic Lifestyle Changes     Personal Nutrition Goals   Nutrition Goal Pt to describe the benefit of including fruits, vegetables, whole grains, and low-fat dairy products in a heart healthy meal plan.     Intervention Plan   Intervention Prescribe, educate and counsel regarding individualized specific dietary modifications aiming towards targeted core components such as weight, hypertension, lipid management, diabetes, heart failure and other comorbidities.   Expected Outcomes Short Term Goal: Understand basic principles of dietary content, such as calories, fat, sodium, cholesterol and nutrients.;Long Term  Goal: Adherence to prescribed nutrition plan.      Nutrition Discharge: Nutrition Scores:   Nutrition Goals Re-Evaluation:   Nutrition Goals Re-Evaluation:   Nutrition Goals Discharge (Final Nutrition Goals Re-Evaluation):   Psychosocial: Target Goals: Acknowledge presence or absence of significant depression and/or stress, maximize coping skills, provide positive support system. Participant is able to verbalize types and ability to use techniques and skills needed for reducing stress and depression.  Initial Review & Psychosocial Screening:   Quality of Life Scores:     Quality of Life - 04/23/17 1225      Quality of Life Scores   Health/Function Pre 27.89 %   Socioeconomic Pre 27.71 %   Psych/Spiritual Pre 28.93 %   Family Pre 30 %   GLOBAL Pre 28.34 %      PHQ-9: Recent Review Flowsheet Data    [facility] 04/27/2017   Decreased Interest 0   Down, Depressed, Hopeless 0   PHQ - 2 Score 0     Interpretation of Total Score  Total Score Depression Severity:  1-4 = Minimal depression, 5-9 = Mild depression, 10-14 = Moderate depression, 15-19 = Moderately severe depression, 20-27 = Severe depression   Psychosocial Evaluation and Intervention:   Psychosocial Re-Evaluation:   Psychosocial Discharge (Final Psychosocial Re-Evaluation):   Vocational Rehabilitation: Provide vocational rehab assistance to qualifying candidates.   Vocational Rehab Evaluation & Intervention:     Vocational Rehab - 04/23/17 1401      Initial Vocational Rehab Evaluation & Intervention   Assessment shows need for Vocational Rehabilitation No  Education: Education Goals: Education classes will be provided on a weekly basis, covering required topics. Participant will state understanding/return demonstration of topics presented.  Learning Barriers/Preferences:     Learning Barriers/Preferences - 04/23/17 0919      Learning Barriers/Preferences   Learning  Barriers Sight   Learning Preferences Computer/Internet;Written Material      Education Topics: Count Your Pulse:  -Group instruction provided by verbal instruction, demonstration, patient participation and written materials to support subject.  Instructors address importance of being able to find your pulse and how to count your pulse when at home without a heart monitor.  Patients get hands on experience counting their pulse with staff help and individually.   Heart Attack, Angina, and Risk Factor Modification:  -Group instruction provided by verbal instruction, video, and written materials to support subject.  Instructors address signs and symptoms of angina and heart attacks.    Also discuss risk factors for heart disease and how to make changes to improve heart health risk factors.   CARDIAC REHAB PHASE II EXERCISE from 05/08/2017 in [place]  Date  04/29/17  Instruction Review Code  2- meets goals/outcomes      Functional Fitness:  -Group instruction provided by verbal instruction, demonstration, patient participation, and written materials to support subject.  Instructors address safety measures for doing things around the house.  Discuss how to get up and down off the floor, how to pick things up properly, how to safely get out of a chair without assistance, and balance training.   CARDIAC REHAB PHASE II EXERCISE from 05/08/2017 in [place]  Date  05/01/17  Instruction Review Code  2- meets goals/outcomes      Meditation and Mindfulness:  -Group instruction provided by verbal instruction, patient participation, and written materials to support subject.  Instructor addresses importance of mindfulness and meditation practice to help reduce stress and improve awareness.  Instructor also leads participants through a meditation exercise.    Stretching for Flexibility and Mobility:  -Group instruction provided by  verbal instruction, patient participation, and written materials to support subject.  Instructors lead participants through series of stretches that are designed to increase flexibility thus improving mobility.  These stretches are additional exercise for major muscle groups that are typically performed during regular warm up and cool down.   Hands Only CPR:  -Group verbal, video, and participation provides a basic overview of AHA guidelines for community CPR. Role-play of emergencies allow participants the opportunity to practice calling for help and chest compression technique with discussion of AED use.   Hypertension: -Group verbal and written instruction that provides a basic overview of hypertension including the most recent diagnostic guidelines, risk factor reduction with self-care instructions and medication management.   CARDIAC REHAB PHASE II EXERCISE from 05/08/2017 in [place]  Date  05/08/17  Instruction Review Code  2- meets goals/outcomes       Nutrition I class: Heart Healthy Eating:  -Group instruction provided by PowerPoint slides, verbal discussion, and written materials to support subject matter. The instructor gives an explanation and review of the Therapeutic Lifestyle Changes diet recommendations, which includes a discussion on lipid goals, dietary fat, sodium, fiber, plant stanol/sterol esters, sugar, and the components of a well-balanced, healthy diet.   CARDIAC REHAB PHASE II EXERCISE from 05/08/2017 in [place]  Date  04/23/17  Educator  RD  Instruction Review Code  Not applicable  Nutrition II class: Lifestyle Skills:  -Group instruction provided by PowerPoint slides, verbal discussion, and written materials to support subject matter. The instructor gives an explanation and review of label reading, grocery shopping for heart health, heart healthy recipe modifications, and ways to make  healthier choices when eating out.   CARDIAC REHAB PHASE II EXERCISE from 05/08/2017 in [place]  Date  04/23/17  Educator  RD  Instruction Review Code  Not applicable      Diabetes Question & Answer:  -Group instruction provided by PowerPoint slides, verbal discussion, and written materials to support subject matter. The instructor gives an explanation and review of diabetes co-morbidities, pre- and post-prandial blood glucose goals, pre-exercise blood glucose goals, signs, symptoms, and treatment of hypoglycemia and hyperglycemia, and foot care basics.   Diabetes Blitz:  -Group instruction provided by PowerPoint slides, verbal discussion, and written materials to support subject matter. The instructor gives an explanation and review of the physiology behind type 1 and type 2 diabetes, diabetes medications and rational behind using different medications, pre- and post-prandial blood glucose recommendations and Hemoglobin A1c goals, diabetes diet, and exercise including blood glucose guidelines for exercising safely.    Portion Distortion:  -Group instruction provided by PowerPoint slides, verbal discussion, written materials, and food models to support subject matter. The instructor gives an explanation of serving size versus portion size, changes in portions sizes over the last 20 years, and what consists of a serving from each food group.   Stress Management:  -Group instruction provided by verbal instruction, video, and written materials to support subject matter.  Instructors review role of stress in heart disease and how to cope with stress positively.     Exercising on Your Own:  -Group instruction provided by verbal instruction, power point, and written materials to support subject.  Instructors discuss benefits of exercise, components of exercise, frequency and intensity of exercise, and end points for exercise.  Also discuss use of nitroglycerin and  activating EMS.  Review options of places to exercise outside of rehab.  Review guidelines for sex with heart disease.   Cardiac Drugs I:  -Group instruction provided by verbal instruction and written materials to support subject.  Instructor reviews cardiac drug classes: antiplatelets, anticoagulants, beta blockers, and statins.  Instructor discusses reasons, side effects, and lifestyle considerations for each drug class.   Cardiac Drugs II:  -Group instruction provided by verbal instruction and written materials to support subject.  Instructor reviews cardiac drug classes: angiotensin converting enzyme inhibitors (ACE-I), angiotensin II receptor blockers (ARBs), nitrates, and calcium channel blockers.  Instructor discusses reasons, side effects, and lifestyle considerations for each drug class.   Anatomy and Physiology of the Circulatory System:  Group verbal and written instruction and models provide basic cardiac anatomy and physiology, with the coronary electrical and arterial systems. Review of: AMI, Angina, Valve disease, Heart Failure, Peripheral Artery Disease, Cardiac Arrhythmia, Pacemakers, and the ICD.   CARDIAC REHAB PHASE II EXERCISE from 05/08/2017 in [place]  Date  05/06/17  Instruction Review Code  2- meets goals/outcomes      Other Education:  -Group or individual verbal, written, or video instructions that support the educational goals of the cardiac rehab program.   Knowledge Questionnaire Score:     Knowledge Questionnaire Score - 04/23/17 1215      Knowledge Questionnaire Score   Pre Score 22/24      Core Components/Risk Factors/Patient Goals at Admission:  Personal Goals and Risk Factors at Admission - 04/23/17 1225      Core Components/Risk Factors/Patient Goals on Admission   Heart Failure Yes   Intervention Provide a combined exercise and nutrition program that is supplemented with education, support and counseling  about heart failure. Directed toward relieving symptoms such as shortness of breath, decreased exercise tolerance, and extremity edema.   Expected Outcomes Improve functional capacity of life;Short term: Attendance in program 2-3 days a week with increased exercise capacity. Reported lower sodium intake. Reported increased fruit and vegetable intake. Reports medication compliance.;Short term: Daily weights obtained and reported for increase. Utilizing diuretic protocols set by physician.;Long term: Adoption of self-care skills and reduction of barriers for early signs and symptoms recognition and intervention leading to self-care maintenance.   Lipids Yes   Intervention Provide education and support for participant on nutrition & aerobic/resistive exercise along with prescribed medications to achieve LDL <26m, HDL >469m   Expected Outcomes Short Term: Participant states understanding of desired cholesterol values and is compliant with medications prescribed. Participant is following exercise prescription and nutrition guidelines.;Long Term: Cholesterol controlled with medications as prescribed, with individualized exercise RX and with personalized nutrition plan. Value goals: LDL < 7041mHDL > 40 mg.      Core Components/Risk Factors/Patient Goals Review:    Core Components/Risk Factors/Patient Goals at Discharge (Final Review):    ITP Comments:     ITP Comments    Row Name 04/23/17 0917           ITP Comments Medical Director, Dr. TraFransico Him       Comments: BarKeanus attended 7 sessions and done well with exercise. BarKetrina on hold due to medical issues.MarBarnet PallN,BSN 05/19/2017 6:42 PM

## 2017-05-20 ENCOUNTER — Encounter (HOSPITAL_COMMUNITY): Payer: Medicare Other

## 2017-05-22 ENCOUNTER — Encounter (HOSPITAL_COMMUNITY): Payer: Medicare Other

## 2017-05-25 ENCOUNTER — Encounter (HOSPITAL_COMMUNITY): Admission: RE | Admit: 2017-05-25 | Payer: Medicare Other | Source: Ambulatory Visit

## 2017-05-27 ENCOUNTER — Encounter (HOSPITAL_COMMUNITY): Payer: Medicare Other

## 2017-05-29 ENCOUNTER — Encounter (HOSPITAL_COMMUNITY): Payer: Medicare Other

## 2017-06-01 ENCOUNTER — Encounter (HOSPITAL_COMMUNITY): Payer: Medicare Other

## 2017-06-03 ENCOUNTER — Encounter (HOSPITAL_COMMUNITY): Admission: RE | Admit: 2017-06-03 | Payer: Medicare Other | Source: Ambulatory Visit

## 2017-06-04 ENCOUNTER — Telehealth (HOSPITAL_COMMUNITY): Payer: Self-pay | Admitting: *Deleted

## 2017-06-05 ENCOUNTER — Encounter (HOSPITAL_COMMUNITY): Payer: Medicare Other

## 2017-06-08 ENCOUNTER — Encounter (HOSPITAL_COMMUNITY): Payer: Medicare Other

## 2017-06-09 ENCOUNTER — Ambulatory Visit: Payer: Self-pay | Admitting: Surgery

## 2017-06-09 ENCOUNTER — Telehealth: Payer: Self-pay

## 2017-06-09 NOTE — Telephone Encounter (Signed)
   [facility] Pre-operative Risk Assessment    Request for surgical clearance:  1. What type of surgery is being performed? cholecystectomy  2. When is this surgery scheduled? TBD  3. Are there any medications that need to be held prior to surgery and how long? She is currently taking brilinta so we need instructions from you as to how the patient should HOLD medication preoperatively. Please advise.  4. Practice name and name of physician performing surgery? [facility]. Dr. Earnstine Regal MD   5. What is your office phone and fax number? Phone: [phone] Fax: [phone]. ATTNMammie Lorenzo   6. Anesthesia type (None, local, MAC, general) ? general   Stephannie Peters 06/09/2017, 3:40 PM  _________________________________________________________________   (provider comments below)

## 2017-06-09 NOTE — Telephone Encounter (Signed)
Cardiologist/MD to clear anti-platelets

## 2017-06-10 ENCOUNTER — Encounter (HOSPITAL_COMMUNITY): Admission: RE | Admit: 2017-06-10 | Payer: Medicare Other | Source: Ambulatory Visit

## 2017-06-10 NOTE — Telephone Encounter (Signed)
Pt with new mLAD stent DES 03/30/17 in [place], [place].  Now with gallstones and recent "gallbladder attack"  She has seen Dr Gerrit Friends and he would like to do cholecystectomy before further problems.  She has had no cardiac chest pain and no SOB    Will need to check with Dr. Clifton James only 3 months out from new DES.

## 2017-06-11 ENCOUNTER — Encounter (HOSPITAL_COMMUNITY): Payer: Self-pay | Admitting: *Deleted

## 2017-06-11 ENCOUNTER — Telehealth (HOSPITAL_COMMUNITY): Payer: Self-pay | Admitting: *Deleted

## 2017-06-11 DIAGNOSIS — Z955 Presence of coronary angioplasty implant and graft: Secondary | ICD-10-CM

## 2017-06-11 DIAGNOSIS — I214 Non-ST elevation (NSTEMI) myocardial infarction: Secondary | ICD-10-CM

## 2017-06-11 NOTE — Progress Notes (Signed)
Discharge Progress Report  Patient Details  Name: Carrie Barnes MRN: [id number] Date of Birth: 04/27/1936 Referring Provider:     CARDIAC REHAB PHASE II [facility]/2017 [facility]  Referring Provider  Earney HamburgMcAlhany, Chris MD       Number of Visits: 7  Reason for Discharge:  Early Exit:  medical reason  Smoking History:  Social History   Tobacco Use  Smoking Status Former Smoker  Smokeless Tobacco Never Used    Diagnosis:  Status post coronary artery stent placement  NSTEMI (non-ST elevated myocardial infarction) (HCC)  ADL UCSD:   Initial Exercise Prescription: Initial Exercise Prescription - 04/23/17 1200      Date of Initial Exercise RX and Referring Provider   Date  04/23/17    Referring Provider  Earney HamburgMcAlhany, Chris MD      Treadmill   MPH  2    Grade  1    Minutes  10    METs  2.81      Recumbant Bike   Level  1.5    Minutes  10    METs  2.3      NuStep   Level  2    SPM  70    Minutes  10    METs  2      Prescription Details   Frequency (times per week)  3    Duration  Progress to 30 minutes of continuous aerobic without signs/symptoms of physical distress      Intensity   THRR 40-80% of Max Heartrate  56-113    Ratings of Perceived Exertion  11-13    Perceived Dyspnea  0-4      Progression   Progression  Continue to progress workloads to maintain intensity without signs/symptoms of physical distress.      Resistance Training   Training Prescription  Yes    Weight  2lbs    Reps  10-15       Discharge Exercise Prescription (Final Exercise Prescription Changes): Exercise Prescription Changes - 05/08/17 0951      Response to Exercise   Blood Pressure (Admit)  110/72    Blood Pressure (Exercise)  128/80    Blood Pressure (Exit)  108/78    Heart Rate (Admit)  77 bpm    Heart Rate (Exercise)  111 bpm    Heart Rate (Exit)  75 bpm    Rating of Perceived Exertion (Exercise)  11    Symptoms  none     Duration  Progress to 30 minutes of  aerobic without signs/symptoms of physical distress    Intensity  THRR unchanged      Progression   Progression  Continue to progress workloads to maintain intensity without signs/symptoms of physical distress.    Average METs  3.1      Resistance Training   Training Prescription  Yes    Weight  3lbs    Reps  10-15    Time  10 Minutes      Interval Training   Interval Training  No      Treadmill   MPH  2    Grade  1    Minutes  10    METs  2.81      Bike   Level  1    Minutes  10    METs  3.79      NuStep   Level  3    SPM  85    Minutes  10    METs  2.6      Home Exercise Plan   Plans to continue exercise at  Home (comment)    Frequency  Add 2 additional days to program exercise sessions.    Initial Home Exercises Provided  05/04/17       Functional Capacity: 6 Minute Walk    Row Name 04/23/17 1217         6 Minute Walk   Phase  Initial     Distance  1400 feet     Walk Time  6 minutes     # of Rest Breaks  0     METS  2.6     RPE  10     VO2 Peak  9.16     Symptoms  No     Resting HR  65 bpm     Resting BP  116/60     Resting Oxygen Saturation   98 %     Exercise Oxygen Saturation  during 6 min walk  99 %     Max Ex. HR  94 bpm     Max Ex. BP  135/70     2 Minute Post BP  114/70        Psychological, QOL, Others - Outcomes: PHQ 2/9: Depression screen PHQ 2/9 04/27/2017  Decreased Interest 0  Down, Depressed, Hopeless 0  PHQ - 2 Score 0    Quality of Life: Quality of Life - 04/23/17 1225      Quality of Life Scores   Health/Function Pre  27.89 %    Socioeconomic Pre  27.71 %    Psych/Spiritual Pre  28.93 %    Family Pre  30 %    GLOBAL Pre  28.34 %       Personal Goals: Goals established at orientation with interventions provided to work toward goal. Personal Goals and Risk Factors at Admission - 04/23/17 1225      Core Components/Risk Factors/Patient Goals on Admission   Heart Failure   Yes    Intervention  Provide a combined exercise and nutrition program that is supplemented with education, support and counseling about heart failure. Directed toward relieving symptoms such as shortness of breath, decreased exercise tolerance, and extremity edema.    Expected Outcomes  Improve functional capacity of life;Short term: Attendance in program 2-3 days a week with increased exercise capacity. Reported lower sodium intake. Reported increased fruit and vegetable intake. Reports medication compliance.;Short term: Daily weights obtained and reported for increase. Utilizing diuretic protocols set by physician.;Long term: Adoption of self-care skills and reduction of barriers for early signs and symptoms recognition and intervention leading to self-care maintenance.    Lipids  Yes    Intervention  Provide education and support for participant on nutrition & aerobic/resistive exercise along with prescribed medications to achieve LDL 70mg , HDL >40mg .    Expected Outcomes  Short Term: Participant states understanding of desired cholesterol values and is compliant with medications prescribed. Participant is following exercise prescription and nutrition guidelines.;Long Term: Cholesterol controlled with medications as prescribed, with individualized exercise RX and with personalized nutrition plan. Value goals: LDL < 70mg , HDL > 40 mg.        Personal Goals Discharge:   Exercise Goals and Review: Exercise Goals    Row Name 04/23/17 0933 04/23/17 1239           Exercise Goals   Increase Physical Activity  Yes  -      Intervention  Provide advice, education, support and counseling about physical activity/exercise needs.;Develop an individualized exercise prescription for aerobic and resistive training based on initial evaluation findings, risk stratification, comorbidities and participant's personal goals.  -      Expected Outcomes  Achievement of increased cardiorespiratory fitness and  enhanced flexibility, muscular endurance and strength shown through measurements of functional capacity and personal statement of participant.  -      Increase Strength and Stamina  Yes  - increase muscle tone, balance and flexibility      Intervention  Provide advice, education, support and counseling about physical activity/exercise needs.;Develop an individualized exercise prescription for aerobic and resistive training based on initial evaluation findings, risk stratification, comorbidities and participant's personal goals.  -      Expected Outcomes  Achievement of increased cardiorespiratory fitness and enhanced flexibility, muscular endurance and strength shown through measurements of functional capacity and personal statement of participant.  -      Able to understand and use rate of perceived exertion (RPE) scale  Yes  -      Intervention  Provide education and explanation on how to use RPE scale  -      Expected Outcomes  Short Term: Able to use RPE daily in rehab to express subjective intensity level;Long Term:  Able to use RPE to guide intensity level when exercising independently  -      Knowledge and understanding of Target Heart Rate Range (THRR)  Yes  -      Intervention  Provide education and explanation of THRR including how the numbers were predicted and where they are located for reference  -      Expected Outcomes  Short Term: Able to state/look up THRR;Long Term: Able to use THRR to govern intensity when exercising independently;Short Term: Able to use daily as guideline for intensity in rehab  -      Able to check pulse independently  Yes  -      Intervention  Provide education and demonstration on how to check pulse in carotid and radial arteries.;Review the importance of being able to check your own pulse for safety during independent exercise  -      Expected Outcomes  Short Term: Able to explain why pulse checking is important during independent exercise;Long Term: Able to check  pulse independently and accurately  -      Understanding of Exercise Prescription  Yes  -      Intervention  Provide education, explanation, and written materials on patient's individual exercise prescription  -      Expected Outcomes  Short Term: Able to explain program exercise prescription;Long Term: Able to explain home exercise prescription to exercise independently  -         Nutrition & Weight - Outcomes: Pre Biometrics - 04/23/17 1223      Pre Biometrics   Waist Circumference  35.5 inches    Hip Circumference  39 inches    Waist to Hip Ratio  0.91 %    Triceps Skinfold  30 mm    % Body Fat  39 %    Grip Strength  22 kg    Flexibility  7.5 in    Single Leg Stand  8 seconds      Post Biometrics - 05/08/17 0951       Post  Biometrics   Weight  147 lb 14.9 oz (67.1 kg)    BMI (Calculated)  26.21       Nutrition: Nutrition  Therapy & Goals - 04/23/17 1025      Nutrition Therapy   Diet  Therapeutic Lifestyle Changes      Personal Nutrition Goals   Nutrition Goal  Pt to describe the benefit of including fruits, vegetables, whole grains, and low-fat dairy products in a heart healthy meal plan.      Intervention Plan   Intervention  Prescribe, educate and counsel regarding individualized specific dietary modifications aiming towards targeted core components such as weight, hypertension, lipid management, diabetes, heart failure and other comorbidities.    Expected Outcomes  Short Term Goal: Understand basic principles of dietary content, such as calories, fat, sodium, cholesterol and nutrients.;Long Term Goal: Adherence to prescribed nutrition plan.       Nutrition Discharge: Nutrition Assessments - 06/24/17 1015      MEDFICTS Scores   Pre Score  21       Education Questionnaire Score:  Kandyse attended 7 exercise sessions. Cornie has been out due to issues with her gall bladder. We Will discharge Marlaya from cardiac surgery. Amellia would like to return to  exercise after her gallbladder surgery. Cheri plans to walk on her own in the meantime.Gladstone Lighter, RN,BSN 06/25/2017 2:23 PM

## 2017-06-11 NOTE — Telephone Encounter (Signed)
Can we let her know that we would prefer to wait until she is 6 months out from her coronary stent placement before stopping antiplatelet therapy (ASA and Brilinta) for elective surgery? Her stent was late August. I would prefer to wait until the end of February, as long as her gallbladder disease is stable. If she has more acute gallbladder events, would have to consider admission for IV antiplatelet therapy while holding oral antiplatelet therapy for surgery.   Thanks, chris

## 2017-06-11 NOTE — Telephone Encounter (Signed)
Called pt to let her know that Dr.McAlhany would prefer her to wait until the end of February 2019 for her Galbladder surgery, since she just had a stent placement in August.  Pt is agreeable to this and will also let [facility], Dr. Ardine Eng office know.  Pt thanked me for the call.

## 2017-06-12 ENCOUNTER — Encounter (HOSPITAL_COMMUNITY): Payer: Medicare Other

## 2017-06-12 NOTE — Addendum Note (Signed)
Encounter addended by: Artist Pais on: 06/12/2017  2:39 PM<BR>    Actions taken: Visit Navigator Flowsheet section accepted, Vitals modified

## 2017-06-15 ENCOUNTER — Encounter (HOSPITAL_COMMUNITY): Payer: Medicare Other

## 2017-06-17 ENCOUNTER — Encounter (HOSPITAL_COMMUNITY): Payer: Medicare Other

## 2017-06-19 ENCOUNTER — Encounter (HOSPITAL_COMMUNITY): Payer: Medicare Other

## 2017-06-22 ENCOUNTER — Encounter (HOSPITAL_COMMUNITY): Payer: Medicare Other

## 2017-06-24 ENCOUNTER — Encounter (HOSPITAL_COMMUNITY): Payer: Medicare Other

## 2017-06-24 NOTE — Addendum Note (Signed)
Encounter addended by: Jacques Earthly, RD on: 06/24/2017 10:17 AM  Actions taken: Visit Navigator Flowsheet section accepted

## 2017-06-26 ENCOUNTER — Encounter (HOSPITAL_COMMUNITY): Payer: Medicare Other

## 2017-06-29 ENCOUNTER — Encounter (HOSPITAL_COMMUNITY): Payer: Medicare Other

## 2017-07-01 ENCOUNTER — Encounter (HOSPITAL_COMMUNITY): Payer: Medicare Other

## 2017-07-06 ENCOUNTER — Encounter (HOSPITAL_COMMUNITY): Payer: Medicare Other

## 2017-07-07 ENCOUNTER — Other Ambulatory Visit: Payer: Self-pay | Admitting: Internal Medicine

## 2017-07-07 ENCOUNTER — Ambulatory Visit
Admission: RE | Admit: 2017-07-07 | Discharge: 2017-07-07 | Disposition: A | Discharge status: Home / Self Care | Payer: Medicare Other | Source: Ambulatory Visit | Attending: Internal Medicine | Admitting: Internal Medicine

## 2017-07-07 DIAGNOSIS — M545 Low back pain, unspecified: Secondary | ICD-10-CM

## 2017-07-08 ENCOUNTER — Encounter (HOSPITAL_COMMUNITY): Payer: Medicare Other

## 2017-07-08 NOTE — Progress Notes (Signed)
Chief Complaint  Patient presents with  . Coronary Artery Disease    History of Present Illness: 81 yo female with history of CAD, ischemic cardiomyopathy, HTN and HLD here today for cardiac follow up. She was admitted to [facility] in August 2018 with an MI and had a drug eluting stent placed in the LAD. LVEF at the time of her MI was 35-40% but improved to 55-60% on echo here in our office September 2018. She has been seen in our office by Estella Husk, PA and Darlen Round, [place]. I am meeting her for the first time today. She had issues with her gallbladder in October 2018 and her DAPT was held. It sounds like she was placed on Lovenox. At f/u in our office 05/18/17, she reported feeling well and was back on her ASA and Brilinta. She has been seen in [facility] (Dr. Harlow Asa) and planning underway for cholecystectomy in February 2019.   She is here today for follow up. The patient denies any chest pain, dyspnea, palpitations, lower extremity edema, orthopnea, PND, dizziness, near syncope or syncope. She has not been exercising due to her gallbladder issue. She stopped cardiac rehab when she had her gallbladder issues. She is now having back pain.    Primary Care Physician: Seward Carol, MD  Past Medical History:  Diagnosis Date  . Cholecystitis   . Coronary artery disease    a. 03/2017: 80-90% LAD stenosis (PCI/DES placement with a 2.75x16 mm Promus Premier stent). No significant stenosis along RCA or LCx.   Marland Kitchen DJD (degenerative joint disease)   . Hyperlipidemia   . Osteoporosis     Past Surgical History:  Procedure Laterality Date  . ABDOMINAL HYSTERECTOMY    . APPENDECTOMY    . CESAREAN SECTION    . TONSILLECTOMY    . vaginal sling      Current Outpatient Medications  Medication Sig Dispense Refill  . alendronate (FOSAMAX) 70 MG tablet Take 70 mg by mouth once a week. Take in the morning with a full glass of water, on an empty stomach, and do  not take anything else by mouth or lie down for the next 30 min.    Marland Kitchen amoxicillin-clavulanate (AUGMENTIN) 875-125 MG tablet Take 1 tablet by mouth 2 (two) times daily.    Marland Kitchen aspirin EC 81 MG tablet Take 1 tablet (81 mg total) by mouth daily.    Marland Kitchen atorvastatin (LIPITOR) 80 MG tablet Take 1 tablet (80 mg total) by mouth daily. 90 tablet 3  . calcium carbonate (CALCIUM 600) 600 MG TABS tablet Take 1,200 mg by mouth 2 (two) times daily with a meal.    . cetirizine (ZYRTEC) 10 MG tablet Take 10 mg by mouth daily as needed for allergies.     . Cholecalciferol (VITAMIN D3) 2000 units capsule Take 2,000 Units by mouth daily.    Marland Kitchen CRANBERRY EXTRACT PO Take 1 capsule by mouth daily.    Marland Kitchen losartan (COZAAR) 25 MG tablet Take 1 tablet (25 mg total) by mouth daily. 90 tablet 3  . metoprolol succinate (TOPROL-XL) 50 MG 24 hr tablet Take 1 tablet (50 mg total) by mouth daily. 90 tablet 3  . Multiple Vitamin (MULTI-VITAMINS) TABS Take 1 tablet by mouth daily.    . nitroGLYCERIN (NITROSTAT) 0.4 MG SL tablet Place 1 tablet (0.4 mg total) under the tongue every 5 (five) minutes as needed for chest pain. 25 tablet 3  . Omega-3 Fatty Acids (FISH OIL) 1200 MG  CAPS Take 1,200 mg by mouth daily.    Marland Kitchen omeprazole (PRILOSEC) 20 MG capsule Take 20 mg by mouth daily.    . ticagrelor (BRILINTA) 90 MG TABS tablet Take 1 tablet (90 mg total) by mouth 2 (two) times daily. 180 tablet 3  . traMADol (ULTRAM) 50 MG tablet Take 50 mg by mouth every 6 (six) hours as needed for moderate pain.    . vitamin C (ASCORBIC ACID) 500 MG tablet Take 500 mg by mouth daily.     No current facility-administered medications for this visit.     No Known Allergies  Social History   Socioeconomic History  . Marital status: Widowed    Spouse name: Not on file  . Number of children: 4  . Years of education: Not on file  . Highest education level: Not on file  Social Needs  . Financial resource strain: Not on file  . Food insecurity - worry:  Not on file  . Food insecurity - inability: Not on file  . Transportation needs - medical: Not on file  . Transportation needs - non-medical: Not on file  Occupational History  . Occupation: Agricultural engineer  . Occupation: Brewing technologist   Tobacco Use  . Smoking status: Former Smoker    Packs/day: 1.00    Years: 30.00    Pack years: 30.00    Types: Cigarettes  . Smokeless tobacco: Former Systems developer    Quit date: 08/11/1986  Substance and Sexual Activity  . Alcohol use: Yes    Comment: Social  . Drug use: Not on file  . Sexual activity: Not on file  Other Topics Concern  . Not on file  Social History Narrative  . Not on file    Family History  Problem Relation Age of Onset  . Heart disease Father   . Breast cancer Sister   . Pancreatic cancer Sister     Review of Systems:  As stated in the HPI and otherwise negative.   BP 118/60   Pulse 65   Ht 5' 1.5" (1.562 m)   Wt 146 lb 12.8 oz (66.6 kg)   SpO2 97%   BMI 27.29 kg/m   Physical Examination: General: Well developed, well nourished, NAD  HEENT: OP clear, mucus membranes moist  SKIN: warm, dry. No rashes. Neuro: No focal deficits  Musculoskeletal: Muscle strength 5/5 all ext  Psychiatric: Mood and affect normal  Neck: No JVD, no carotid bruits, no thyromegaly, no lymphadenopathy.  Lungs:Clear bilaterally, no wheezes, rhonci, crackles Cardiovascular: Regular rate and rhythm. No murmurs, gallops or rubs. Abdomen:Soft. Bowel sounds present. Non-tender.  Extremities: No lower extremity edema. Pulses are 2 + in the bilateral DP/PT.  EKG:  EKG is not ordered today. The ekg ordered today demonstrates   Recent Labs: 03/31/2017: BUN 11; Creatinine, Ser 0.73; Potassium 5.1; Sodium 140   Lipid Panel No results found for: CHOL, TRIG, HDL, CHOLHDL, VLDL, LDLCALC, LDLDIRECT   Wt Readings from Last 3 Encounters:  07/09/17 146 lb 12.8 oz (66.6 kg)  05/18/17 143 lb 6.4 oz (65 kg)  05/08/17 147 lb 14.9 oz (67.1 kg)      Other studies Reviewed: Additional studies/ records that were reviewed today include: . Review of the above records demonstrates:    Assessment and Plan:   1. CAD without angina: She is having no chest pain. Will continue ASA, Brilinta, beta blocker and statin. Will check BMET this am. Will try to hold off on cholecystectomy until February 2019 given recent  placement of drug eluting coronary stent.   2. Ischemic cardiomyopathy: LV function normal by most recent echo September 2018.   3. Hyperlipidemia: Will check lipids and LFTS today. Continue statin.   Current medicines are reviewed at length with the patient today.  The patient does not have concerns regarding medicines.  The following changes have been made:  no change  Labs/ tests ordered today include:   Orders Placed This Encounter  Procedures  . Comp Met (CMET)  . Lipid Profile     Disposition:   FU with me in 3 months   Signed, Lauree Chandler, MD 07/09/2017 10:16 AM    [facility] [address], [place], [place]  [address] Phone: [phone]; Fax: [phone]

## 2017-07-09 ENCOUNTER — Ambulatory Visit: Payer: Medicare Other | Admitting: Cardiovascular Disease

## 2017-07-09 ENCOUNTER — Encounter: Payer: Self-pay | Admitting: Cardiovascular Disease

## 2017-07-09 VITALS — BP 118/60 | HR 65 | Ht 61.5 in | Wt 146.8 lb

## 2017-07-09 DIAGNOSIS — I2510 Atherosclerotic heart disease of native coronary artery without angina pectoris: Secondary | ICD-10-CM

## 2017-07-09 DIAGNOSIS — I255 Ischemic cardiomyopathy: Secondary | ICD-10-CM | POA: Diagnosis not present

## 2017-07-09 DIAGNOSIS — E785 Hyperlipidemia, unspecified: Secondary | ICD-10-CM | POA: Diagnosis not present

## 2017-07-09 LAB — COMPREHENSIVE METABOLIC PANEL
ALT: 21 IU/L (ref 0–32)
AST: 30 IU/L (ref 0–40)
Albumin/Globulin Ratio: 1.6 (ref 1.2–2.2)
Albumin: 4.4 g/dL (ref 3.5–4.8)
Alkaline Phosphatase: 54 IU/L (ref 39–117)
BUN/Creatinine Ratio: 11 — ABNORMAL LOW (ref 12–28)
BUN: 8 mg/dL (ref 8–27)
Bilirubin Total: 0.6 mg/dL (ref 0.0–1.2)
CO2: 28 mmol/L (ref 20–29)
Calcium: 9.5 mg/dL (ref 8.7–10.3)
Chloride: 99 mmol/L (ref 96–106)
Creatinine, Ser: 0.76 mg/dL (ref 0.57–1.00)
GFR calc Af Amer: 86 mL/min/{1.73_m2} (ref >59)
GFR calc non Af Amer: 75 mL/min/{1.73_m2} (ref >59)
Globulin, Total: 2.7 g/dL (ref 1.5–4.5)
Glucose: 93 mg/dL (ref 65–99)
Potassium: 4.3 mmol/L (ref 3.5–5.2)
Sodium: 144 mmol/L (ref 134–144)
Total Protein: 7.1 g/dL (ref 6.0–8.5)

## 2017-07-09 LAB — LIPID PANEL
Chol/HDL Ratio: 2.9 ratio (ref 0.0–4.4)
Cholesterol, Total: 120 mg/dL (ref 100–199)
HDL: 42 mg/dL (ref >39)
LDL Calculated: 37 mg/dL (ref 0–99)
Triglycerides: 207 mg/dL — ABNORMAL HIGH (ref 0–149)
VLDL Cholesterol Cal: 41 mg/dL — ABNORMAL HIGH (ref 5–40)

## 2017-07-09 NOTE — Patient Instructions (Signed)
Medication Instructions:  Your physician recommends that you continue on your current medications as directed. Please refer to the Current Medication list given to you today.   Labwork: Lab work to be done today--CMET and Lipid profile  Testing/Procedures: none  Follow-Up: You have a follow up appointment with Dr. Clifton James on March 4,2019 at 9:00  Any Other Special Instructions Will Be Listed Below (If Applicable).     If you need a refill on your cardiac medications before your next appointment, please call your pharmacy.

## 2017-07-10 ENCOUNTER — Encounter (HOSPITAL_COMMUNITY): Payer: Medicare Other

## 2017-07-13 ENCOUNTER — Encounter (HOSPITAL_COMMUNITY): Payer: Medicare Other

## 2017-07-15 ENCOUNTER — Encounter (HOSPITAL_COMMUNITY): Payer: Medicare Other

## 2017-07-17 ENCOUNTER — Encounter (HOSPITAL_COMMUNITY): Payer: Medicare Other

## 2017-07-20 ENCOUNTER — Encounter (HOSPITAL_COMMUNITY): Payer: Medicare Other

## 2017-07-22 ENCOUNTER — Encounter (HOSPITAL_COMMUNITY): Payer: Medicare Other

## 2017-07-24 ENCOUNTER — Encounter (HOSPITAL_COMMUNITY): Payer: Medicare Other

## 2017-07-27 ENCOUNTER — Encounter (HOSPITAL_COMMUNITY): Payer: Medicare Other

## 2017-07-29 ENCOUNTER — Encounter (HOSPITAL_COMMUNITY): Payer: Medicare Other

## 2017-10-12 ENCOUNTER — Ambulatory Visit: Payer: Medicare Other | Admitting: Cardiovascular Disease

## 2017-10-12 ENCOUNTER — Encounter: Payer: Self-pay | Admitting: Cardiovascular Disease

## 2017-10-12 VITALS — BP 136/78 | HR 68 | Ht 61.5 in | Wt 151.0 lb

## 2017-10-12 DIAGNOSIS — E785 Hyperlipidemia, unspecified: Secondary | ICD-10-CM

## 2017-10-12 DIAGNOSIS — I255 Ischemic cardiomyopathy: Secondary | ICD-10-CM

## 2017-10-12 DIAGNOSIS — Z01810 Encounter for preprocedural cardiovascular examination: Secondary | ICD-10-CM | POA: Diagnosis not present

## 2017-10-12 DIAGNOSIS — I2510 Atherosclerotic heart disease of native coronary artery without angina pectoris: Secondary | ICD-10-CM

## 2017-10-12 NOTE — Progress Notes (Signed)
Chief Complaint  Patient presents with  . Follow-up    CAD    History of Present Illness: 82 yo female with history of CAD, ischemic cardiomyopathy, HTN and HLD here today for cardiac follow up. She was admitted to [facility] in August 2018 with an MI and had a drug eluting stent placed in the LAD. LVEF at the time of her MI was 35-40% but improved to 55-60% on echo here in our office September 2018. She had issues with her gallbladder in October 2018 and her DAPT was held. It sounds like she was placed on Lovenox. At f/u in our office 05/18/17, she reported feeling well and was back on her ASA and Brilinta. She has been seen in [facility] (Dr. Gerrit Friends) and planning underway for cholecystectomy.   She is here today for follow up. The patient denies any chest pain, palpitations, lower extremity edema, orthopnea, PND, dizziness, near syncope or syncope. She reports dyspnea at times at rest. Mild chest pain at times at rest relieved with belching.   Primary Care Physician: Renford Dills, MD  Past Medical History:  Diagnosis Date  . Cholecystitis   . Coronary artery disease    a. 03/2017: 80-90% LAD stenosis (PCI/DES placement with a 2.75x16 mm Promus Premier stent). No significant stenosis along RCA or LCx.   Marland Kitchen DJD (degenerative joint disease)   . Hyperlipidemia   . Osteoporosis     Past Surgical History:  Procedure Laterality Date  . ABDOMINAL HYSTERECTOMY    . APPENDECTOMY    . CESAREAN SECTION    . TONSILLECTOMY    . vaginal sling      Current Outpatient Medications  Medication Sig Dispense Refill  . alendronate (FOSAMAX) 70 MG tablet Take 70 mg by mouth once a week. Take in the morning with a full glass of water, on an empty stomach, and do not take anything else by mouth or lie down for the next 30 min.    Marland Kitchen aspirin EC 81 MG tablet Take 1 tablet (81 mg total) by mouth daily.    Marland Kitchen atorvastatin (LIPITOR) 80 MG tablet Take 1 tablet (80 mg total) by  mouth daily. 90 tablet 3  . calcium carbonate (CALCIUM 600) 600 MG TABS tablet Take 1,200 mg by mouth 2 (two) times daily with a meal.    . cetirizine (ZYRTEC) 10 MG tablet Take 10 mg by mouth daily as needed for allergies.     . Cholecalciferol (VITAMIN D3) 2000 units capsule Take 2,000 Units by mouth daily.    Marland Kitchen CRANBERRY EXTRACT PO Take 1 capsule by mouth daily.    Marland Kitchen losartan (COZAAR) 25 MG tablet Take 1 tablet (25 mg total) by mouth daily. 90 tablet 3  . metoprolol succinate (TOPROL-XL) 50 MG 24 hr tablet Take 1 tablet (50 mg total) by mouth daily. 90 tablet 3  . Multiple Vitamin (MULTI-VITAMINS) TABS Take 1 tablet by mouth daily.    . nitroGLYCERIN (NITROSTAT) 0.4 MG SL tablet Place 1 tablet (0.4 mg total) under the tongue every 5 (five) minutes as needed for chest pain. 25 tablet 3  . Omega-3 Fatty Acids (FISH OIL) 1200 MG CAPS Take 1,200 mg by mouth daily.    Marland Kitchen omeprazole (PRILOSEC) 20 MG capsule Take 20 mg by mouth daily.    . ticagrelor (BRILINTA) 90 MG TABS tablet Take 1 tablet (90 mg total) by mouth 2 (two) times daily. 180 tablet 3  . traMADol (ULTRAM) 50 MG tablet  Take 50 mg by mouth every 6 (six) hours as needed for moderate pain.    . vitamin C (ASCORBIC ACID) 500 MG tablet Take 500 mg by mouth daily.     No current facility-administered medications for this visit.     No Known Allergies  Social History   Socioeconomic History  . Marital status: Widowed    Spouse name: Not on file  . Number of children: 4  . Years of education: Not on file  . Highest education level: Not on file  Social Needs  . Financial resource strain: Not on file  . Food insecurity - worry: Not on file  . Food insecurity - inability: Not on file  . Transportation needs - medical: Not on file  . Transportation needs - non-medical: Not on file  Occupational History  . Occupation: Futures trader  . Occupation: Engineer, building services   Tobacco Use  . Smoking status: Former Smoker     Packs/day: 1.00    Years: 30.00    Pack years: 30.00    Types: Cigarettes  . Smokeless tobacco: Former Neurosurgeon    Quit date: 08/11/1986  Substance and Sexual Activity  . Alcohol use: Yes    Comment: Social  . Drug use: Not on file  . Sexual activity: Not on file  Other Topics Concern  . Not on file  Social History Narrative  . Not on file    Family History  Problem Relation Age of Onset  . Heart disease Father   . Breast cancer Sister   . Pancreatic cancer Sister     Review of Systems:  As stated in the HPI and otherwise negative.   BP 136/78   Pulse 68   Ht 5' 1.5" (1.562 m)   Wt 151 lb (68.5 kg)   SpO2 98%   BMI 28.07 kg/m   Physical Examination:  General: Well developed, well nourished, NAD  HEENT: OP clear, mucus membranes moist  SKIN: warm, dry. No rashes. Neuro: No focal deficits  Musculoskeletal: Muscle strength 5/5 all ext  Psychiatric: Mood and affect normal  Neck: No JVD, no carotid bruits, no thyromegaly, no lymphadenopathy.  Lungs:Clear bilaterally, no wheezes, rhonci, crackles Cardiovascular: Regular rate and rhythm. No murmurs, gallops or rubs. Abdomen:Soft. Bowel sounds present. Non-tender.  Extremities: No lower extremity edema. Pulses are 2 + in the bilateral DP/PT.  EKG:  EKG is not ordered today. The ekg ordered today demonstrates   Recent Labs: 07/09/2017: ALT 21; BUN 8; Creatinine, Ser 0.76; Potassium 4.3; Sodium 144   Lipid Panel    Component Value Date/Time   CHOL 120 07/09/2017 0850   TRIG 207 (H) 07/09/2017 0850   HDL 42 07/09/2017 0850   CHOLHDL 2.9 07/09/2017 0850   LDLCALC 37 07/09/2017 0850     Wt Readings from Last 3 Encounters:  10/12/17 151 lb (68.5 kg)  07/09/17 146 lb 12.8 oz (66.6 kg)  05/18/17 143 lb 6.4 oz (65 kg)     Other studies Reviewed: Additional studies/ records that were reviewed today include: . Review of the above records demonstrates:    Assessment and Plan:   1. CAD without angina: No chest pain.  Drug eluting stent placed in the LAD in August 2018. Will continue ASA, Brilinta, beta blocker and statin. While it is optimal to continue dual anti-platelet therapy for one year post drug eluting stent, I think we can stop her Brilinta for several weeks to let her undergo cholecystectomy.   2. Ischemic cardiomyopathy: LV  function is normal by echo in September 2018.   3. Hyperlipidemia: Lipids well controlled Continue statin.   4. Pre-operative cardiac evaluation: She is doing well from a cardiac standpoint. I think she can proceed with her gallbladder surgery. As above, it would be optimal for 12 months of dual anti-platelet therapy but the new drug eluting stents should be healed at this point. She can hold Brilinta one week before her surgery. I will route this note to DR. Gerkin.   Current medicines are reviewed at length with the patient today.  The patient does not have concerns regarding medicines.  The following changes have been made:  no change  Labs/ tests ordered today include:   No orders of the defined types were placed in this encounter.    Disposition:   FU with me in 6 months   Signed, Verne Carrow, MD 10/12/2017 9:33 AM    [facility] [address] [place], [place], [place]  [address] Phone: [phone]; Fax: [phone]

## 2017-10-12 NOTE — Patient Instructions (Signed)

## 2017-10-21 ENCOUNTER — Ambulatory Visit: Payer: Self-pay | Admitting: Surgery

## 2017-10-28 ENCOUNTER — Encounter (HOSPITAL_COMMUNITY): Payer: Self-pay

## 2017-10-28 NOTE — Patient Instructions (Signed)
Carrie Barnes  10/28/2017   Your procedure is scheduled on: 11-05-17   Report to [facility] Main  Entrance    Report to admitting at 9:00AM   Call this number if you have problems the morning of surgery [phone]   Remember: Do not eat food or drink liquids :After Midnight.     Take these medicines the morning of surgery with A SIP OF WATER: metoprolol, omeprazole, cetirizine, atorvastatin, tylenol if needed                                You may not have any metal on your body including hair pins and              piercings  Do not wear jewelry, make-up, lotions, powders or perfumes, deodorant             Do not wear nail polish.  Do not shave  48 hours prior to surgery.                Do not bring valuables to the hospital.  IS NOT             RESPONSIBLE   FOR VALUABLES.  Contacts, dentures or bridgework may not be worn into surgery.  Leave suitcase in the car. After surgery it may be brought to your room.                 Please read over the following fact sheets you were given: _____________________________________________________________________             [facility] - Preparing for Surgery Before surgery, you can play an important role.  Because skin is not sterile, your skin needs to be as free of germs as possible.  You can reduce the number of germs on your skin by washing with CHG (chlorahexidine gluconate) soap before surgery.  CHG is an antiseptic cleaner which kills germs and bonds with the skin to continue killing germs even after washing. Please DO NOT use if you have an allergy to CHG or antibacterial soaps.  If your skin becomes reddened/irritated stop using the CHG and inform your nurse when you arrive at Short Stay. Do not shave (including legs and underarms) for at least 48 hours prior to the first CHG shower.  You may shave your face/neck. Please follow these instructions carefully:  1.  Shower with CHG Soap the  night before surgery and the  morning of Surgery.  2.  If you choose to wash your hair, wash your hair first as usual with your  normal  shampoo.  3.  After you shampoo, rinse your hair and body thoroughly to remove the  shampoo.                           4.  Use CHG as you would any other liquid soap.  You can apply chg directly  to the skin and wash                       Gently with a scrungie or clean washcloth.  5.  Apply the CHG Soap to your body ONLY FROM THE NECK DOWN.   Do not use on face/ open  Wound or open sores. Avoid contact with eyes, ears mouth and genitals (private parts).                       Wash face,  Genitals (private parts) with your normal soap.             6.  Wash thoroughly, paying special attention to the area where your surgery  will be performed.  7.  Thoroughly rinse your body with warm water from the neck down.  8.  DO NOT shower/wash with your normal soap after using and rinsing off  the CHG Soap.                9.  Pat yourself dry with a clean towel.            10.  Wear clean pajamas.            11.  Place clean sheets on your bed the night of your first shower and do not  sleep with pets. Day of Surgery : Do not apply any lotions/deodorants the morning of surgery.  Please wear clean clothes to the hospital/surgery center.  FAILURE TO FOLLOW THESE INSTRUCTIONS MAY RESULT IN THE CANCELLATION OF YOUR SURGERY PATIENT SIGNATURE_________________________________  NURSE SIGNATURE__________________________________  ________________________________________________________________________

## 2017-10-28 NOTE — Progress Notes (Addendum)
LOV/cardiac clearance Dr Clifton James 10-12-17 epic   ECHO 04-28-17 epic   EKG 03-31-17 epic   CXR 03-17-17 epic care everywhere

## 2017-10-29 ENCOUNTER — Encounter (HOSPITAL_COMMUNITY)
Admission: RE | Admit: 2017-10-29 | Discharge: 2017-10-29 | Disposition: A | Discharge status: Home / Self Care | Payer: Medicare Other | Source: Ambulatory Visit | Attending: Surgery | Admitting: Surgery

## 2017-10-29 ENCOUNTER — Other Ambulatory Visit: Payer: Self-pay

## 2017-10-29 ENCOUNTER — Encounter (HOSPITAL_COMMUNITY): Payer: Self-pay

## 2017-10-29 DIAGNOSIS — K8010 Calculus of gallbladder with chronic cholecystitis without obstruction: Secondary | ICD-10-CM | POA: Diagnosis not present

## 2017-10-29 DIAGNOSIS — I255 Ischemic cardiomyopathy: Secondary | ICD-10-CM | POA: Diagnosis not present

## 2017-10-29 DIAGNOSIS — Z01812 Encounter for preprocedural laboratory examination: Secondary | ICD-10-CM | POA: Diagnosis present

## 2017-10-29 DIAGNOSIS — Z01810 Encounter for preprocedural cardiovascular examination: Secondary | ICD-10-CM | POA: Diagnosis not present

## 2017-10-29 DIAGNOSIS — I252 Old myocardial infarction: Secondary | ICD-10-CM | POA: Insufficient documentation

## 2017-10-29 DIAGNOSIS — I10 Essential (primary) hypertension: Secondary | ICD-10-CM | POA: Insufficient documentation

## 2017-10-29 HISTORY — DX: Essential (primary) hypertension: I10

## 2017-10-29 HISTORY — DX: Presence of coronary angioplasty implant and graft: Z95.5

## 2017-10-29 HISTORY — DX: Acute myocardial infarction, unspecified: I21.9

## 2017-10-29 HISTORY — DX: Ischemic cardiomyopathy: I25.5

## 2017-10-29 LAB — CBC
HCT: 41.2 % (ref 36.0–46.0)
Hemoglobin: 11.7 g/dL — ABNORMAL LOW (ref 12.0–15.0)
MCH: 29.8 pg (ref 26.0–34.0)
MCHC: 28.4 g/dL — ABNORMAL LOW (ref 30.0–36.0)
MCV: 105.1 fL — ABNORMAL HIGH (ref 78.0–100.0)
Platelets: 191 10*3/uL (ref 150–400)
RBC: 3.92 MIL/uL (ref 3.87–5.11)
RDW: 12.8 % (ref 11.5–15.5)
WBC: 7.6 10*3/uL (ref 4.0–10.5)

## 2017-10-29 LAB — BASIC METABOLIC PANEL
Anion gap: 7 (ref 5–15)
BUN: 12 mg/dL (ref 6–20)
CO2: 32 mmol/L (ref 22–32)
Calcium: 9.7 mg/dL (ref 8.9–10.3)
Chloride: 102 mmol/L (ref 101–111)
Creatinine, Ser: 0.72 mg/dL (ref 0.44–1.00)
GFR calc Af Amer: >60 mL/min (ref >60)
GFR calc non Af Amer: >60 mL/min (ref >60)
Glucose, Bld: 103 mg/dL — ABNORMAL HIGH (ref 65–99)
Potassium: 4.7 mmol/L (ref 3.5–5.1)
Sodium: 141 mmol/L (ref 135–145)

## 2017-11-01 ENCOUNTER — Encounter (HOSPITAL_COMMUNITY): Payer: Self-pay | Admitting: Surgery

## 2017-11-01 DIAGNOSIS — K8010 Calculus of gallbladder with chronic cholecystitis without obstruction: Secondary | ICD-10-CM | POA: Diagnosis present

## 2017-11-01 NOTE — H&P (Signed)
General Surgery [facility]  Carrie Barnes DOB: July 29, 1936 Widowed / Language: [place] / Race: White Female   History of Present Illness  The patient is a 82 year old female who presents for evaluation of gall stones.  CC: symptomatic gallstones, abnormal LFT's, cholecystitis  Patient is referred by Dr. Renford Dills for surgical management of symptomatic cholelithiasis with a history of cholecystitis. Patient has imaging studies from July 2018 which showed multiple small gallstones. Patient had an episode at the end of September which caused her to be hospitalized in [place], [place]. She had acute cholecystitis. Percutaneous drainage was considered due to recent placement of a coronary stent. Patient is on Brilinta. Patient was treated with intravenous antibiotics and fortunately resolved without intervention. Liver function test however from May 15, 2017 continued to show mild elevation of her transaminases and total bilirubin and alkaline phosphatase. Patient has been eating a bland diet. She is scheduled to see her cardiologist in consultation in late November. She presents today to discuss possible cholecystectomy in the near future. Patient has had previous appendectomy, cesarean section, abdominal hysterectomy, and bladder suspension. She denies any previous hepatobiliary or pancreatic disease. Family history is notable for pancreatic cancer in her sister. She is accompanied today by her daughter. She denies any current abdominal pain.   Past Surgical History Appendectomy  Cataract Surgery  Bilateral. Cesarean Section - 1  Colon Polyp Removal - Colonoscopy  Hysterectomy (not due to cancer) - Partial  Tonsillectomy   Diagnostic Studies History Colonoscopy  1-5 years ago Mammogram  within last year Pap Smear  >5 years ago  Allergies  No Known Drug Allergies 06/09/2017 Allergies Reconciled   Medication History  Alendronate  Sodium (70MG  Tablet, Oral) Active. Atorvastatin Calcium (80MG  Tablet, Oral) Active. Brilinta (90MG  Tablet, Oral) Active. Losartan Potassium (25MG  Tablet, Oral) Active. Metoprolol Succinate ER (50MG  Tablet ER 24HR, Oral) Active. Nitroglycerin (0.4MG  Tab Sublingual, Sublingual) Active. Pravastatin Sodium (40MG  Tablet, Oral) Active. Medications Reconciled  Social History  Alcohol use  Occasional alcohol use. Caffeine use  Carbonated beverages, Coffee, Tea. No drug use  Tobacco use  Former smoker.  Family History Arthritis  Father, Mother. Hypertension  Father, Mother. Malignant Neoplasm Of Pancreas  Sister.  Pregnancy / Birth History Age at menarche  14 years. Contraceptive History  Intrauterine device, Oral contraceptives. Gravida  4 Maternal age  82-20 Para  4  Other Problems  Arthritis  Bladder Problems  Gastroesophageal Reflux Disease  Hemorrhoids  High blood pressure  Hypercholesterolemia  Myocardial infarction   Review of Systems General Present- Night Sweats and Weight Loss. Not Present- Appetite Loss, Chills, Fatigue, Fever and Weight Gain. Skin Not Present- Change in Wart/Mole, Dryness, Hives, Jaundice, New Lesions, Non-Healing Wounds, Rash and Ulcer. HEENT Present- Seasonal Allergies and Wears glasses/contact lenses. Not Present- Earache, Hearing Loss, Hoarseness, Nose Bleed, Oral Ulcers, Ringing in the Ears, Sinus Pain, Sore Throat, Visual Disturbances and Yellow Eyes. Respiratory Not Present- Bloody sputum, Chronic Cough, Difficulty Breathing, Snoring and Wheezing. Breast Not Present- Breast Mass, Breast Pain, Nipple Discharge and Skin Changes. Cardiovascular Not Present- Chest Pain, Difficulty Breathing Lying Down, Leg Cramps, Palpitations, Rapid Heart Rate, Shortness of Breath and Swelling of Extremities. Gastrointestinal Present- Bloating and Hemorrhoids. Not Present- Abdominal Pain, Bloody Stool, Change in Bowel Habits, Chronic  diarrhea, Constipation, Difficulty Swallowing, Excessive gas, Gets full quickly at meals, Indigestion, Nausea, Rectal Pain and Vomiting. Female Genitourinary Not Present- Frequency, Nocturia, Painful Urination, Pelvic Pain and Urgency. Musculoskeletal Not Present- Back Pain, Joint  Pain, Joint Stiffness, Muscle Pain, Muscle Weakness and Swelling of Extremities. Neurological Not Present- Decreased Memory, Fainting, Headaches, Numbness, Seizures, Tingling, Tremor, Trouble walking and Weakness. Psychiatric Not Present- Anxiety, Bipolar, Change in Sleep Pattern, Depression, Fearful and Frequent crying. Endocrine Not Present- Cold Intolerance, Excessive Hunger, Hair Changes, Heat Intolerance, Hot flashes and New Diabetes. Hematology Present- Blood Thinners. Not Present- Easy Bruising, Excessive bleeding, Gland problems, HIV and Persistent Infections.  Vitals Weight: 145.6 lb Height: 62in Body Surface Area: 1.67 m Body Mass Index: 26.63 kg/m  Temp.: 97.67F  Pulse: 70 (Regular)  BP: 116/78 (Sitting, Left Arm, Standard)  Physical Exam  See vital signs recorded above  GENERAL APPEARANCE Development: normal Nutritional status: normal Gross deformities: none  SKIN Rash, lesions, ulcers: none Induration, erythema: none Nodules: none palpable  EYES Conjunctiva and lids: normal Pupils: equal and reactive Iris: normal bilaterally  EARS, NOSE, MOUTH, THROAT External ears: no lesion or deformity External nose: no lesion or deformity Hearing: grossly normal Lips: no lesion or deformity Dentition: normal for age Oral mucosa: moist  NECK Symmetric: yes Trachea: midline Thyroid: no palpable nodules in the thyroid bed  CHEST Respiratory effort: normal Retraction or accessory muscle use: no Breath sounds: normal bilaterally Rales, rhonchi, wheeze: none  CARDIOVASCULAR Auscultation: regular rhythm, normal rate Murmurs: none Pulses: carotid and radial pulse 2+  palpable Lower extremity edema: none Lower extremity varicosities: none  ABDOMEN Distension: none Masses: none palpable Tenderness: Mild tenderness to deep palpation right upper quadrant without palpable mass Hepatosplenomegaly: not present Hernia: not present  MUSCULOSKELETAL Station and gait: normal Digits and nails: no clubbing or cyanosis Muscle strength: grossly normal all extremities Range of motion: grossly normal all extremities Deformity: none  LYMPHATIC Cervical: none palpable Supraclavicular: none palpable  PSYCHIATRIC Oriented to person, place, and time: yes Mood and affect: normal for situation Judgment and insight: appropriate for situation   Assessment & Plan  CHOLELITHIASIS WITH CHRONIC CHOLECYSTITIS (K80.10) ABNORMAL LIVER FUNCTION TEST (R94.5)  Follow-up after evaluation by consultant (cardiology)  Patient presents with history of symptomatic cholelithiasis, chronic cholecystitis, and possible choledocholithiasis. She is accompanied by her daughter. They are provided with written literature on gallbladder surgery to review at home.  Patient was hospitalized in [place], [place], in late September 2018 with what sounds like acute cholecystitis. Surgery was not performed due to her being anticoagulated for her coronary stent. Patient luckily resolved with medical management.  Patient does require cholecystectomy at some point. She has cholelithiasis. She has chronic cholecystitis. She has a history of acute cholecystitis. She has abnormal liver function test. We will need to coordinate this with cardiology in order to be able to perform her surgery safely in the near future. We will request cardiac clearance from her cardiologist.  I have recommended laparoscopic cholecystectomy with intraoperative cholangiography. We discussed the risk and benefits of the procedure. We discussed the possible need for conversion to open surgery. We discussed the  possibility of common bile duct stones and need for ERCP during her hospitalization. Patient and her daughter understand and wish to proceed with surgery after clearance by cardiology.  The risks and benefits of the procedure have been discussed at length with the patient. The patient understands the proposed procedure, potential alternative treatments, and the course of recovery to be expected. All of the patient's questions have been answered at this time. The patient wishes to proceed with surgery.  Darnell Level, MD [facility] Office: [phone]

## 2017-11-05 ENCOUNTER — Encounter (HOSPITAL_COMMUNITY)
Admission: RE | Disposition: A | Discharge status: Home / Self Care | Payer: Self-pay | Source: Ambulatory Visit | Attending: Surgery

## 2017-11-05 ENCOUNTER — Other Ambulatory Visit: Payer: Self-pay

## 2017-11-05 ENCOUNTER — Ambulatory Visit (HOSPITAL_COMMUNITY): Payer: Medicare Other | Admitting: Anesthesiology

## 2017-11-05 ENCOUNTER — Ambulatory Visit (HOSPITAL_COMMUNITY): Payer: Medicare Other

## 2017-11-05 ENCOUNTER — Observation Stay (HOSPITAL_COMMUNITY)
Admission: RE | Admit: 2017-11-05 | Discharge: 2017-11-06 | Disposition: A | Discharge status: Home / Self Care | Payer: Medicare Other | Source: Ambulatory Visit | Attending: Surgery | Admitting: Surgery

## 2017-11-05 ENCOUNTER — Encounter (HOSPITAL_COMMUNITY): Payer: Self-pay | Admitting: Anesthesiology

## 2017-11-05 DIAGNOSIS — K8010 Calculus of gallbladder with chronic cholecystitis without obstruction: Secondary | ICD-10-CM | POA: Diagnosis present

## 2017-11-05 DIAGNOSIS — Z955 Presence of coronary angioplasty implant and graft: Secondary | ICD-10-CM | POA: Diagnosis not present

## 2017-11-05 DIAGNOSIS — Z800 Family history of malignant neoplasm of digestive organs: Secondary | ICD-10-CM | POA: Insufficient documentation

## 2017-11-05 DIAGNOSIS — Z7982 Long term (current) use of aspirin: Secondary | ICD-10-CM | POA: Insufficient documentation

## 2017-11-05 DIAGNOSIS — I2510 Atherosclerotic heart disease of native coronary artery without angina pectoris: Secondary | ICD-10-CM | POA: Insufficient documentation

## 2017-11-05 DIAGNOSIS — E7800 Pure hypercholesterolemia, unspecified: Secondary | ICD-10-CM | POA: Diagnosis not present

## 2017-11-05 DIAGNOSIS — K8064 Calculus of gallbladder and bile duct with chronic cholecystitis without obstruction: Secondary | ICD-10-CM | POA: Diagnosis present

## 2017-11-05 DIAGNOSIS — Z87891 Personal history of nicotine dependence: Secondary | ICD-10-CM | POA: Insufficient documentation

## 2017-11-05 DIAGNOSIS — I10 Essential (primary) hypertension: Secondary | ICD-10-CM | POA: Diagnosis not present

## 2017-11-05 DIAGNOSIS — K219 Gastro-esophageal reflux disease without esophagitis: Secondary | ICD-10-CM | POA: Diagnosis not present

## 2017-11-05 DIAGNOSIS — I252 Old myocardial infarction: Secondary | ICD-10-CM | POA: Insufficient documentation

## 2017-11-05 DIAGNOSIS — Z419 Encounter for procedure for purposes other than remedying health state, unspecified: Secondary | ICD-10-CM

## 2017-11-05 DIAGNOSIS — Z79899 Other long term (current) drug therapy: Secondary | ICD-10-CM | POA: Diagnosis not present

## 2017-11-05 HISTORY — PX: CHOLECYSTECTOMY: SHX55

## 2017-11-05 SURGERY — LAPAROSCOPIC CHOLECYSTECTOMY WITH INTRAOPERATIVE CHOLANGIOGRAM
Anesthesia: General | Site: Abdomen

## 2017-11-05 MED ORDER — LIDOCAINE 2% (20 MG/ML) 5 ML SYRINGE
INTRAMUSCULAR | Status: DC | PRN
  Administered 2017-11-05: 80 mg via INTRAVENOUS

## 2017-11-05 MED ORDER — CEFAZOLIN SODIUM-DEXTROSE 2-3 GM-%(50ML) IV SOLR
INTRAVENOUS | Status: DC | PRN
Start: 2017-11-05 — End: 2017-11-05
  Administered 2017-11-05: 2 g via INTRAVENOUS

## 2017-11-05 MED ORDER — ROCURONIUM BROMIDE 10 MG/ML (PF) SYRINGE
PREFILLED_SYRINGE | INTRAVENOUS | Status: DC | PRN
  Administered 2017-11-05: 50 mg via INTRAVENOUS

## 2017-11-05 MED ORDER — HYDROMORPHONE HCL 1 MG/ML IJ SOLN
INTRAMUSCULAR | Status: AC
  Filled 2017-11-05: qty 1

## 2017-11-05 MED ORDER — GLYCOPYRROLATE 0.2 MG/ML IV SOSY
PREFILLED_SYRINGE | INTRAVENOUS | Status: AC
  Filled 2017-11-05: qty 5

## 2017-11-05 MED ORDER — HYDROMORPHONE HCL 1 MG/ML IJ SOLN
1.0000 mg | INTRAMUSCULAR | Status: DC | PRN
  Administered 2017-11-05: 1 mg via INTRAVENOUS
  Filled 2017-11-05: qty 1

## 2017-11-05 MED ORDER — NITROGLYCERIN 0.4 MG SL SUBL: 0.4000 mg | SUBLINGUAL_TABLET | SUBLINGUAL | Status: DC | PRN

## 2017-11-05 MED ORDER — PROPOFOL 10 MG/ML IV BOLUS
INTRAVENOUS | Status: DC | PRN
  Administered 2017-11-05: 150 mg via INTRAVENOUS

## 2017-11-05 MED ORDER — ROCURONIUM BROMIDE 10 MG/ML (PF) SYRINGE
PREFILLED_SYRINGE | INTRAVENOUS | Status: AC
  Filled 2017-11-05: qty 5

## 2017-11-05 MED ORDER — FENTANYL CITRATE (PF) 100 MCG/2ML IJ SOLN
INTRAMUSCULAR | Status: AC
  Filled 2017-11-05: qty 2

## 2017-11-05 MED ORDER — PROPOFOL 10 MG/ML IV BOLUS
INTRAVENOUS | Status: AC
  Filled 2017-11-05: qty 20

## 2017-11-05 MED ORDER — FENTANYL CITRATE (PF) 100 MCG/2ML IJ SOLN
INTRAMUSCULAR | Status: DC | PRN
  Administered 2017-11-05: 25 ug via INTRAVENOUS
  Administered 2017-11-05: 75 ug via INTRAVENOUS

## 2017-11-05 MED ORDER — IOPAMIDOL (ISOVUE-300) INJECTION 61%
INTRAVENOUS | Status: DC | PRN
  Administered 2017-11-05: 10 mL

## 2017-11-05 MED ORDER — ONDANSETRON HCL 4 MG/2ML IJ SOLN
INTRAMUSCULAR | Status: AC
  Filled 2017-11-05: qty 6

## 2017-11-05 MED ORDER — IOPAMIDOL (ISOVUE-300) INJECTION 61%
INTRAVENOUS | Status: AC
  Filled 2017-11-05: qty 50

## 2017-11-05 MED ORDER — KCL IN DEXTROSE-NACL 20-5-0.45 MEQ/L-%-% IV SOLN
INTRAVENOUS | Status: DC
  Administered 2017-11-05: 15:00:00 via INTRAVENOUS
  Filled 2017-11-05: qty 1000

## 2017-11-05 MED ORDER — SUGAMMADEX SODIUM 200 MG/2ML IV SOLN
INTRAVENOUS | Status: DC | PRN
  Administered 2017-11-05: 200 mg via INTRAVENOUS

## 2017-11-05 MED ORDER — LACTATED RINGERS IR SOLN
Status: DC | PRN
  Administered 2017-11-05: 1000 mL

## 2017-11-05 MED ORDER — PROMETHAZINE HCL 25 MG/ML IJ SOLN: 6.2500 mg | INTRAMUSCULAR | Status: DC | PRN

## 2017-11-05 MED ORDER — HYDROCODONE-ACETAMINOPHEN 5-325 MG PO TABS
1.0000 | ORAL_TABLET | ORAL | Status: DC | PRN
  Administered 2017-11-05: 2 via ORAL
  Filled 2017-11-05: qty 2

## 2017-11-05 MED ORDER — BUPIVACAINE-EPINEPHRINE (PF) 0.5% -1:200000 IJ SOLN
INTRAMUSCULAR | Status: AC
  Filled 2017-11-05: qty 30

## 2017-11-05 MED ORDER — ROCURONIUM BROMIDE 10 MG/ML (PF) SYRINGE
PREFILLED_SYRINGE | INTRAVENOUS | Status: AC
  Filled 2017-11-05: qty 10

## 2017-11-05 MED ORDER — BUPIVACAINE-EPINEPHRINE 0.5% -1:200000 IJ SOLN
INTRAMUSCULAR | Status: DC | PRN
  Administered 2017-11-05: 20 mL

## 2017-11-05 MED ORDER — HYDROMORPHONE HCL 1 MG/ML IJ SOLN
0.2500 mg | INTRAMUSCULAR | Status: DC | PRN
  Administered 2017-11-05 (×2): 0.5 mg via INTRAVENOUS

## 2017-11-05 MED ORDER — LIDOCAINE 2% (20 MG/ML) 5 ML SYRINGE
INTRAMUSCULAR | Status: AC
  Filled 2017-11-05: qty 5

## 2017-11-05 MED ORDER — 0.9 % SODIUM CHLORIDE (POUR BTL) OPTIME
TOPICAL | Status: DC | PRN
  Administered 2017-11-05: 1000 mL

## 2017-11-05 MED ORDER — DEXAMETHASONE SODIUM PHOSPHATE 10 MG/ML IJ SOLN
INTRAMUSCULAR | Status: AC
  Filled 2017-11-05: qty 3

## 2017-11-05 MED ORDER — LOSARTAN POTASSIUM 25 MG PO TABS
25.0000 mg | ORAL_TABLET | Freq: Every day | ORAL | Status: DC
  Administered 2017-11-06: 25 mg via ORAL
  Filled 2017-11-05: qty 1

## 2017-11-05 MED ORDER — ACETAMINOPHEN 325 MG PO TABS: 650.0000 mg | ORAL_TABLET | Freq: Four times a day (QID) | ORAL | Status: DC | PRN

## 2017-11-05 MED ORDER — DEXAMETHASONE SODIUM PHOSPHATE 10 MG/ML IJ SOLN
INTRAMUSCULAR | Status: DC | PRN
  Administered 2017-11-05: 10 mg via INTRAVENOUS

## 2017-11-05 MED ORDER — LIDOCAINE 2% (20 MG/ML) 5 ML SYRINGE
INTRAMUSCULAR | Status: AC
  Filled 2017-11-05: qty 10

## 2017-11-05 MED ORDER — ONDANSETRON HCL 4 MG/2ML IJ SOLN: 4.0000 mg | Freq: Four times a day (QID) | INTRAMUSCULAR | Status: DC | PRN

## 2017-11-05 MED ORDER — ACETAMINOPHEN 650 MG RE SUPP: 650.0000 mg | Freq: Four times a day (QID) | RECTAL | Status: DC | PRN

## 2017-11-05 MED ORDER — PHENYLEPHRINE 40 MCG/ML (10ML) SYRINGE FOR IV PUSH (FOR BLOOD PRESSURE SUPPORT)
PREFILLED_SYRINGE | INTRAVENOUS | Status: DC | PRN
  Administered 2017-11-05 (×2): 80 ug via INTRAVENOUS

## 2017-11-05 MED ORDER — CHLORHEXIDINE GLUCONATE CLOTH 2 % EX PADS: 6.0000 | MEDICATED_PAD | Freq: Once | CUTANEOUS | Status: DC

## 2017-11-05 MED ORDER — TRAMADOL HCL 50 MG PO TABS: 50.0000 mg | ORAL_TABLET | Freq: Four times a day (QID) | ORAL | Status: DC | PRN

## 2017-11-05 MED ORDER — METOPROLOL SUCCINATE ER 50 MG PO TB24
50.0000 mg | ORAL_TABLET | Freq: Every day | ORAL | Status: DC
  Administered 2017-11-06: 50 mg via ORAL
  Filled 2017-11-05: qty 1

## 2017-11-05 MED ORDER — ONDANSETRON HCL 4 MG/2ML IJ SOLN
INTRAMUSCULAR | Status: DC | PRN
  Administered 2017-11-05: 4 mg via INTRAVENOUS

## 2017-11-05 MED ORDER — CEFAZOLIN SODIUM-DEXTROSE 2-4 GM/100ML-% IV SOLN
2.0000 g | INTRAVENOUS | Status: DC
  Filled 2017-11-05: qty 100

## 2017-11-05 MED ORDER — GLYCOPYRROLATE 0.2 MG/ML IV SOSY
PREFILLED_SYRINGE | INTRAVENOUS | Status: DC | PRN
  Administered 2017-11-05: .2 mg via INTRAVENOUS

## 2017-11-05 MED ORDER — ONDANSETRON 4 MG PO TBDP: 4.0000 mg | ORAL_TABLET | Freq: Four times a day (QID) | ORAL | Status: DC | PRN

## 2017-11-05 MED ORDER — SUGAMMADEX SODIUM 200 MG/2ML IV SOLN
INTRAVENOUS | Status: AC
  Filled 2017-11-05: qty 2

## 2017-11-05 MED ORDER — EPHEDRINE SULFATE-NACL 50-0.9 MG/10ML-% IV SOSY
PREFILLED_SYRINGE | INTRAVENOUS | Status: DC | PRN
  Administered 2017-11-05: 10 mg via INTRAVENOUS

## 2017-11-05 MED ORDER — LACTATED RINGERS IV SOLN
INTRAVENOUS | Status: DC
  Administered 2017-11-05: 10:00:00 via INTRAVENOUS

## 2017-11-05 SURGICAL SUPPLY — 32 items
APPLIER CLIP ROT 10 11.4 M/L (STAPLE) ×3
APR CLP MED LRG 11.4X10 (STAPLE) ×1
BAG SPEC RTRVL LRG 6X4 10 (ENDOMECHANICALS) ×1
CABLE HIGH FREQUENCY MONO STRZ (ELECTRODE) ×3 IMPLANT
CHLORAPREP W/TINT 26ML (MISCELLANEOUS) ×6 IMPLANT
CLIP APPLIE ROT 10 11.4 M/L (STAPLE) ×1 IMPLANT
CLOSURE WOUND 1/2 X4 (GAUZE/BANDAGES/DRESSINGS) ×1
COVER MAYO STAND STRL (DRAPES) ×3 IMPLANT
COVER SURGICAL LIGHT HANDLE (MISCELLANEOUS) ×3 IMPLANT
DECANTER SPIKE VIAL GLASS SM (MISCELLANEOUS) ×3 IMPLANT
DRAPE C-ARM 42X120 X-RAY (DRAPES) ×3 IMPLANT
ELECT REM PT RETURN 15FT ADLT (MISCELLANEOUS) ×3 IMPLANT
GAUZE SPONGE 2X2 8PLY STRL LF (GAUZE/BANDAGES/DRESSINGS) ×1 IMPLANT
GLOVE SURG ORTHO 8.0 STRL STRW (GLOVE) ×3 IMPLANT
GOWN STRL REUS W/TWL XL LVL3 (GOWN DISPOSABLE) ×6 IMPLANT
HEMOSTAT SURGICEL 4X8 (HEMOSTASIS) IMPLANT
KIT BASIN OR (CUSTOM PROCEDURE TRAY) ×3 IMPLANT
POUCH SPECIMEN RETRIEVAL 10MM (ENDOMECHANICALS) ×3 IMPLANT
SCISSORS LAP 5X35 DISP (ENDOMECHANICALS) ×3 IMPLANT
SET CHOLANGIOGRAPH MIX (MISCELLANEOUS) ×3 IMPLANT
SET IRRIG TUBING LAPAROSCOPIC (IRRIGATION / IRRIGATOR) ×3 IMPLANT
SLEEVE XCEL OPT CAN 5 100 (ENDOMECHANICALS) ×3 IMPLANT
SPONGE GAUZE 2X2 STER 10/PKG (GAUZE/BANDAGES/DRESSINGS) ×2
STRIP CLOSURE SKIN 1/2X4 (GAUZE/BANDAGES/DRESSINGS) ×2 IMPLANT
SUT MNCRL AB 4-0 PS2 18 (SUTURE) ×3 IMPLANT
TOWEL OR 17X26 10 PK STRL BLUE (TOWEL DISPOSABLE) ×3 IMPLANT
TOWEL OR NON WOVEN STRL DISP B (DISPOSABLE) ×3 IMPLANT
TRAY LAPAROSCOPIC (CUSTOM PROCEDURE TRAY) ×3 IMPLANT
TROCAR BLADELESS OPT 5 100 (ENDOMECHANICALS) ×3 IMPLANT
TROCAR XCEL BLUNT TIP 100MML (ENDOMECHANICALS) ×3 IMPLANT
TROCAR XCEL NON-BLD 11X100MML (ENDOMECHANICALS) ×3 IMPLANT
TUBING INSUF HEATED (TUBING) IMPLANT

## 2017-11-05 NOTE — Anesthesia Procedure Notes (Signed)
Procedure Name: Intubation Date/Time: 11/05/2017 11:27 AM Performed by: Lavina Hamman, CRNA Pre-anesthesia Checklist: Patient identified, Emergency Drugs available, Suction available, Patient being monitored and Timeout performed Patient Re-evaluated:Patient Re-evaluated prior to induction Oxygen Delivery Method: Circle system utilized Preoxygenation: Pre-oxygenation with 100% oxygen Induction Type: IV induction Ventilation: Mask ventilation without difficulty Laryngoscope Size: Mac and 4 Grade View: Grade I Tube type: Oral Tube size: 7.5 mm Number of attempts: 1 Airway Equipment and Method: Stylet Placement Confirmation: ETT inserted through vocal cords under direct vision,  positive ETCO2,  CO2 detector and breath sounds checked- equal and bilateral Secured at: 21 cm Tube secured with: Tape Dental Injury: Teeth and Oropharynx as per pre-operative assessment

## 2017-11-05 NOTE — Anesthesia Preprocedure Evaluation (Signed)
Anesthesia Evaluation  Patient identified by MRN, date of birth, ID band Patient awake    Reviewed: Allergy & Precautions, NPO status , Patient's Chart, lab work & pertinent test results  Airway Mallampati: II  TM Distance: >3 FB Neck ROM: Full    Dental no notable dental hx.    Pulmonary neg pulmonary ROS, former smoker,    Pulmonary exam normal breath sounds clear to auscultation       Cardiovascular hypertension, + CAD, + Past MI and + Cardiac Stents   Rhythm:Regular Rate:Normal + Systolic murmurs Left ventricle: The cavity size was normal. Wall thickness was   increased in a pattern of mild LVH. Systolic function was normal.   The estimated ejection fraction was in the range of 55% to 60%.   Wall motion was normal; there were no regional wall motion   abnormalities. Doppler parameters are consistent with abnormal   left ventricular relaxation (grade 1 diastolic dysfunction).   Doppler parameters are consistent with high ventricular filling   pressure. - Mitral valve: Calcified annulus. There was mild regurgitation. - Pericardium, extracardiac: A trivial pericardial effusion was   identified.  Impressions:  - Normal LV systolic function; mild diastolic dysfunction; elevated   LV filling pressure; mild LVH; mild MR; mild TR.    Neuro/Psych negative neurological ROS  negative psych ROS   GI/Hepatic negative GI ROS, Neg liver ROS,   Endo/Other  negative endocrine ROS  Renal/GU negative Renal ROS  negative genitourinary   Musculoskeletal negative musculoskeletal ROS (+)   Abdominal   Peds negative pediatric ROS (+)  Hematology negative hematology ROS (+)   Anesthesia Other Findings   Reproductive/Obstetrics negative OB ROS                             Anesthesia Physical Anesthesia Plan  ASA: III  Anesthesia Plan: General   Post-op Pain Management:    Induction:  Intravenous  PONV Risk Score and Plan: 3 and Ondansetron and Dexamethasone  Airway Management Planned: Oral ETT  Additional Equipment:   Intra-op Plan:   Post-operative Plan: Extubation in OR  Informed Consent: I have reviewed the patients History and Physical, chart, labs and discussed the procedure including the risks, benefits and alternatives for the proposed anesthesia with the patient or authorized representative who has indicated his/her understanding and acceptance.   Dental advisory given  Plan Discussed with: CRNA and Surgeon  Anesthesia Plan Comments:         Anesthesia Quick Evaluation

## 2017-11-05 NOTE — Transfer of Care (Signed)
Immediate Anesthesia Transfer of Care Note  Patient: Carrie Barnes  Procedure(s) Performed: LAPAROSCOPIC CHOLECYSTECTOMY WITH INTRAOPERATIVE CHOLANGIOGRAM (N/A Abdomen)  Patient Location: PACU  Anesthesia Type:General  Level of Consciousness: awake, alert  and oriented  Airway & Oxygen Therapy: Patient Spontanous Breathing and Patient connected to face mask oxygen  Post-op Assessment: Report given to RN  Post vital signs: Reviewed and stable  Last Vitals:  Vitals Value Taken Time  BP 124/61 11/05/2017 12:15 PM  Temp    Pulse 92 11/05/2017 12:19 PM  Resp 8 11/05/2017 12:19 PM  SpO2 100 % 11/05/2017 12:19 PM  Vitals shown include unvalidated device data.  Last Pain:  Vitals:   11/05/17 0933  TempSrc:   PainSc: 2       Patients Stated Pain Goal: 4 (11/05/17 0933)  Complications: No apparent anesthesia complications

## 2017-11-05 NOTE — Interval H&P Note (Signed)
History and Physical Interval Note:  11/05/2017 10:50 AM  Delene Loll  has presented today for surgery, with the diagnosis of Chronic cholecystitis, cholelithiasis  The various methods of treatment have been discussed with the patient and family. After consideration of risks, benefits and other options for treatment, the patient has consented to    Procedure(s): LAPAROSCOPIC CHOLECYSTECTOMY WITH INTRAOPERATIVE CHOLANGIOGRAM (N/A) as a surgical intervention .    The patient's history has been reviewed, patient examined, no change in status, stable for surgery.  I have reviewed the patient's chart and labs.  Questions were answered to the patient's satisfaction.    Darnell Level, MD [facility] Office: [phone]    Madison Direnzo Judie Petit

## 2017-11-05 NOTE — Op Note (Signed)
Procedure Note  Pre-operative Diagnosis:  Chronic cholecystitis, cholelithiasis  Post-operative Diagnosis:  Same, probable distal CBD stones  Surgeon:  Darnell Level, MD  Assistant:  none   Procedure:  Laparoscopic cholecystectomy with intra-operative cholangiography  Anesthesia:  General  Estimated Blood Loss:  minimal  Drains: none         Specimen: Gallbladder to pathology  Indications:  Patient is referred by Dr. Renford Dills for surgical management of symptomatic cholelithiasis with a history of cholecystitis. Patient has imaging studies from July 2018 which showed multiple small gallstones. Patient had an episode at the end of September which caused her to be hospitalized in [place], [place]. She had acute cholecystitis. Percutaneous drainage was considered due to recent placement of a coronary stent. Patient is on Brilinta. Patient was treated with intravenous antibiotics and fortunately resolved without intervention. Liver function test however from May 15, 2017 continued to show mild elevation of her transaminases and total bilirubin and alkaline phosphatase. Patient has been eating a bland diet. She is scheduled to see her cardiologist in consultation in late November. She presents today to discuss possible cholecystectomy in the near future.  Procedure Details:  The patient was seen in the pre-op holding area. The risks, benefits, complications, treatment options, and expected outcomes were previously discussed with the patient. The patient agreed with the proposed plan and has signed the informed consent form.  The patient was brought to the Operating Room, identified as Carrie Barnes and the procedure verified as laparoscopic cholecystectomy with intraoperative cholangiography. A "time out" was completed and the above information confirmed.  The patient was placed in the supine position. Following induction of general anesthesia, the abdomen was prepped and  draped in the usual aseptic fashion.  An incision was made in the skin near the umbilicus. The midline fascia was incised and the peritoneal cavity was entered and a Hasson canula was introduced under direct vision.  The Hasson canula was secured with a 0-Vicryl pursestring suture. Pneumoperitoneum was established with carbon dioxide. Additional trocars were introduced under direct vision along the right costal margin in the midline, mid-clavicular line, and anterior axillary line.   The gallbladder was identified and the fundus grasped and retracted cephalad. Adhesions were taken down bluntly and the electrocautery was utilized as needed, taking care not to injure any adjacent structures. The infundibulum was grasped and retracted laterally, exposing the peritoneum overlying the triangle of Calot. The peritoneum was incised and structures exposed with blunt dissection. The cystic duct was clearly identified, bluntly dissected circumferentially, and clipped at the neck of the gallbladder.  An incision was made in the cystic duct and the cholangiogram catheter introduced. The catheter was secured using an ligaclip.  Real-time cholangiography was performed using C-arm fluoroscopy.  There was rapid filling of a normal caliber common bile duct.  There was reflux of contrast into the left and right hepatic ductal systems.  There was free flow distally into the duodenum.  However, there appeared to be a distal CBD filling defect.  This was reviewed by the radiologist who felt like there may be two stones in the distal common bile duct.  The catheter was removed from the peritoneal cavity.  The cystic duct was then ligated with surgical clips and divided. The cystic artery was identified, dissected circumferentially, ligated with ligaclips, and divided.  The gallbladder was dissected away from the liver bed using the electrocautery for hemostasis. The gallbladder was completely removed from the liver and placed  into an endocatch  bag. The gallbladder was removed in the endocatch bag through the umbilical port site and submitted to pathology for review.  The right upper quadrant was irrigated and the gallbladder bed was inspected. Hemostasis was achieved with the electrocautery.  Pneumoperitoneum was released after viewing removal of the trocars with good hemostasis noted. The umbilical wound was irrigated and the fascia was then closed with the pursestring suture.  Local anesthetic was infiltrated at all port sites. The skin incisions were closed with 4-0 Monocril subcuticular sutures and steri-strips and dressings were applied.  Instrument, sponge, and needle counts were correct at the conclusion of the case.  The patient was awakened from anesthesia and brought to the recovery room in stable condition.  The patient tolerated the procedure well.   Darnell Level, MD [facility] Office: [phone]

## 2017-11-06 ENCOUNTER — Other Ambulatory Visit: Payer: Self-pay | Admitting: Gastroenterology

## 2017-11-06 DIAGNOSIS — K8010 Calculus of gallbladder with chronic cholecystitis without obstruction: Secondary | ICD-10-CM | POA: Diagnosis not present

## 2017-11-06 LAB — COMPREHENSIVE METABOLIC PANEL
ALT: 64 U/L — ABNORMAL HIGH (ref 14–54)
AST: 80 U/L — ABNORMAL HIGH (ref 15–41)
Albumin: 3.6 g/dL (ref 3.5–5.0)
Alkaline Phosphatase: 37 U/L — ABNORMAL LOW (ref 38–126)
Anion gap: 7 (ref 5–15)
BUN: 15 mg/dL (ref 6–20)
CO2: 29 mmol/L (ref 22–32)
Calcium: 8.9 mg/dL (ref 8.9–10.3)
Chloride: 102 mmol/L (ref 101–111)
Creatinine, Ser: 0.77 mg/dL (ref 0.44–1.00)
GFR calc Af Amer: >60 mL/min (ref >60)
GFR calc non Af Amer: >60 mL/min (ref >60)
Glucose, Bld: 119 mg/dL — ABNORMAL HIGH (ref 65–99)
Potassium: 4.5 mmol/L (ref 3.5–5.1)
Sodium: 138 mmol/L (ref 135–145)
Total Bilirubin: 0.7 mg/dL (ref 0.3–1.2)
Total Protein: 6.5 g/dL (ref 6.5–8.1)

## 2017-11-06 LAB — CBC WITH DIFFERENTIAL/PLATELET
Basophils Absolute: 0 10*3/uL (ref 0.0–0.1)
Basophils Relative: 0 %
Eosinophils Absolute: 0 10*3/uL (ref 0.0–0.7)
Eosinophils Relative: 0 %
HCT: 30.2 % — ABNORMAL LOW (ref 36.0–46.0)
Hemoglobin: 10.1 g/dL — ABNORMAL LOW (ref 12.0–15.0)
Lymphocytes Relative: 14 %
Lymphs Abs: 1.4 10*3/uL (ref 0.7–4.0)
MCH: 30.3 pg (ref 26.0–34.0)
MCHC: 33.4 g/dL (ref 30.0–36.0)
MCV: 90.7 fL (ref 78.0–100.0)
Monocytes Absolute: 0.9 10*3/uL (ref 0.1–1.0)
Monocytes Relative: 9 %
Neutro Abs: 7.7 10*3/uL (ref 1.7–7.7)
Neutrophils Relative %: 77 %
Platelets: 155 10*3/uL (ref 150–400)
RBC: 3.33 MIL/uL — ABNORMAL LOW (ref 3.87–5.11)
RDW: 12.5 % (ref 11.5–15.5)
WBC: 9.9 10*3/uL (ref 4.0–10.5)

## 2017-11-06 MED ORDER — TRAMADOL HCL 50 MG PO TABS: 50.0000 mg | ORAL_TABLET | Freq: Four times a day (QID) | ORAL | 0 refills | Status: DC | PRN

## 2017-11-06 NOTE — Progress Notes (Signed)
Explained discharge instructions to Pt. Pt didn't have any questions. Pt discharged.

## 2017-11-06 NOTE — H&P (View-Only) (Signed)
[facility] Consultation Note  Referring Provider: Dr. Darnell Level ([facility]) Primary Care Physician:  Renford Dills, MD  Reason for Consultation:  Abnormal IOC  HPI: Carrie Barnes is a 82 y.o. female with history gallbladder symptoms for about 6 months.  Was in hospital in [facility] for this, cholecystectomy wasn't done due to patient on ticagrelor with recent cardiac stent.  Months passed, and once she was able to get off ticagrelor she had cholecystectomy yesterday.  IOC showed possible distal CBD filling defect(s).  Patient has no appreciable abdominal pain and today's LFTs mild transaminase elevation only.   Past Medical History:  Diagnosis Date  . Cholecystitis   . Coronary artery disease    a. 03/2017: 80-90% LAD stenosis (PCI/DES placement with a 2.75x16 mm Promus Premier stent). No significant stenosis along RCA or LCx.   Marland Kitchen DJD (degenerative joint disease)   . Hyperlipidemia   . Hypertension    dx s/p MI   . Ischemic cardiomyopathy   . Myocardial infarction [facility]) 03/17/2017   03-2017 treated at [facility]   . Osteoporosis   . Status post insertion of drug-eluting stent into left anterior descending (LAD) artery 03/2017    Past Surgical History:  Procedure Laterality Date  . APPENDECTOMY    . CESAREAN SECTION    . stent  Left 03/2017   anterior descending ; drug eluting ; tx of MI at [facility]   . TONSILLECTOMY    . VAGINAL HYSTERECTOMY    . vaginal sling      Prior to Admission medications   Medication Sig Start Date End Date Taking? Authorizing Provider  acetaminophen (TYLENOL) 500 MG tablet Take 500 mg by mouth daily as needed for moderate pain or headache.   Yes [provider]  alendronate (FOSAMAX) 70 MG tablet Take 70 mg by mouth once a week. Take in the morning with a full glass of water, on an empty stomach, and do not take anything else by mouth or lie down for the next 30 min.   Yes [provider]   aspirin EC 81 MG tablet Take 1 tablet (81 mg total) by mouth daily. 03/19/17 03/14/18 Yes [provider]  atorvastatin (LIPITOR) 80 MG tablet Take 1 tablet (80 mg total) by mouth daily. 03/31/17 03/26/18 Yes Dyann Kief, PA-C  calcium carbonate (OSCAL) 1500 (600 Ca) MG TABS tablet Take 600 mg of elemental calcium by mouth 2 (two) times daily with a meal.   Yes [provider]  cetirizine (ZYRTEC) 10 MG tablet Take 10 mg by mouth daily as needed for allergies.    Yes [provider]  Cholecalciferol (VITAMIN D3) 2000 units capsule Take 2,000 Units by mouth daily.   Yes [provider]  CRANBERRY EXTRACT PO Take 1 capsule by mouth daily.   Yes [provider]  losartan (COZAAR) 25 MG tablet Take 1 tablet (25 mg total) by mouth daily. 03/31/17 03/26/18 Yes Dyann Kief, PA-C  metoprolol succinate (TOPROL-XL) 50 MG 24 hr tablet Take 1 tablet (50 mg total) by mouth daily. 03/31/17 03/26/18 Yes Dyann Kief, PA-C  Multiple Vitamin (MULTI-VITAMINS) TABS Take 1 tablet by mouth daily.   Yes [provider]  nitroGLYCERIN (NITROSTAT) 0.4 MG SL tablet Place 1 tablet (0.4 mg total) under the tongue every 5 (five) minutes as needed for chest pain. 03/31/17  Yes Dyann Kief, PA-C  Omega-3 Fatty Acids (FISH OIL) 1200 MG CAPS Take 1,200 mg by  mouth 2 (two) times daily.    Yes [provider]  omeprazole (PRILOSEC) 20 MG capsule Take 20 mg by mouth daily.   Yes [provider]  Polyethyl Glycol-Propyl Glycol (SYSTANE OP) Place 1 drop into both eyes daily as needed (dry eyes).   Yes [provider]  traMADol (ULTRAM) 50 MG tablet Take 50 mg by mouth at bedtime as needed for moderate pain.    Yes [provider]  vitamin C (ASCORBIC ACID) 500 MG tablet Take 500 mg by mouth daily.   Yes [provider]  ticagrelor (BRILINTA) 90 MG TABS tablet Take 1 tablet (90 mg total) by mouth 2 (two) times daily. 03/31/17  03/26/18  Dyann Kief, PA-C    Current Facility-Administered Medications  Medication Dose Route Frequency Provider Last Rate Last Dose  . acetaminophen (TYLENOL) tablet 650 mg  650 mg Oral Q6H PRN Darnell Level, MD       Or  . acetaminophen (TYLENOL) suppository 650 mg  650 mg Rectal Q6H PRN Darnell Level, MD      . dextrose 5 % and 0.45 % NaCl with KCl 20 mEq/L infusion   Intravenous Continuous Darnell Level, MD 50 mL/hr at 11/05/17 1504    . HYDROcodone-acetaminophen (NORCO/VICODIN) 5-325 MG per tablet 1-2 tablet  1-2 tablet Oral Q4H PRN Darnell Level, MD   2 tablet at 11/05/17 2300  . HYDROmorphone (DILAUDID) injection 1 mg  1 mg Intravenous Q2H PRN Darnell Level, MD   1 mg at 11/05/17 1529  . losartan (COZAAR) tablet 25 mg  25 mg Oral Daily Darnell Level, MD      . metoprolol succinate (TOPROL-XL) 24 hr tablet 50 mg  50 mg Oral Daily Gerkin, Tawanna Cooler, MD      . nitroGLYCERIN (NITROSTAT) SL tablet 0.4 mg  0.4 mg Sublingual Q5 min PRN Darnell Level, MD      . ondansetron (ZOFRAN-ODT) disintegrating tablet 4 mg  4 mg Oral Q6H PRN Darnell Level, MD       Or  . ondansetron (ZOFRAN) injection 4 mg  4 mg Intravenous Q6H PRN Darnell Level, MD      . traMADol Janean Sark) tablet 50 mg  50 mg Oral Q6H PRN Darnell Level, MD        Allergies as of 10/19/2017  . (No Known Allergies)    Family History  Problem Relation Age of Onset  . Heart disease Father   . Breast cancer Sister   . Pancreatic cancer Sister     Social History   Socioeconomic History  . Marital status: Widowed    Spouse name: Not on file  . Number of children: 4  . Years of education: Not on file  . Highest education level: Not on file  Occupational History  . Occupation: Futures trader  . Occupation: Administrator  . Financial resource strain: Not on file  . Food insecurity:    Worry: Not on file    Inability: Not on file  . Transportation needs:    Medical: Not on file    Non-medical: Not on  file  Tobacco Use  . Smoking status: Former Smoker    Packs/day: 1.00    Years: 30.00    Pack years: 30.00    Types: Cigarettes  . Smokeless tobacco: Former Neurosurgeon    Quit date: 08/11/1986  . Tobacco comment: quit 30 years   Substance and Sexual Activity  . Alcohol use: Yes    Comment: Social  .  Drug use: Not on file  . Sexual activity: Not on file  Lifestyle  . Physical activity:    Days per week: Not on file    Minutes per session: Not on file  . Stress: Not on file  Relationships  . Social connections:    Talks on phone: Not on file    Gets together: Not on file    Attends religious service: Not on file    Active member of club or organization: Not on file    Attends meetings of clubs or organizations: Not on file    Relationship status: Not on file  . Intimate partner violence:    Fear of current or ex partner: Not on file    Emotionally abused: Not on file    Physically abused: Not on file    Forced sexual activity: Not on file  Other Topics Concern  . Not on file  Social History Narrative  . Not on file    Review of Systems: As per HPI, all others negative  Physical Exam: Vital signs in last 24 hours: Temp:  [97.8 F (36.6 C)-98.8 F (37.1 C)] 98.1 F (36.7 C) (03/29 0405) Pulse Rate:  [61-103] 70 (03/29 0405) Resp:  [10-18] 18 (03/29 0405) BP: (102-144)/(43-73) 110/50 (03/29 0413) SpO2:  [94 %-100 %] 94 % (03/29 0405) Weight:  [151 lb (68.5 kg)] 151 lb (68.5 kg) (03/28 0933) Last BM Date: 11/04/17 General:   Alert, overweight, Well-developed, well-nourished, pleasant and cooperative in NAD Head:  Normocephalic and atraumatic. Eyes:  Sclera clear, no icterus.   Conjunctiva pink. Ears:  Normal auditory acuity. Nose:  No deformity, discharge,  or lesions. Mouth:  No deformity or lesions.  Oropharynx pink & moist. Neck:  Supple; no masses or thyromegaly. Lungs:  Clear throughout to auscultation.   No wheezes, crackles, or rhonchi. No acute distress. Heart:   Regular rate and rhythm; no murmurs, clicks, rubs,  or gallops. Abdomen:  Soft, mild post-operative tenderness, no peritonitis, nondistended. No masses, hepatosplenomegaly or hernias noted. Normal bowel sounds, without guarding, and without rebound.     Msk:  Symmetrical without gross deformities. Normal posture. Pulses:  Normal pulses noted. Extremities:  Without clubbing or edema. Neurologic:  Alert and  oriented x4;  grossly normal neurologically. Skin:  Intact without significant lesions or rashes. Psych:  Alert and cooperative. Normal mood and affect.   Lab Results: Recent Labs    11/06/17 0722  WBC 9.9  HGB 10.1*  HCT 30.2*  PLT 155   BMET Recent Labs    11/06/17 0722  NA 138  K 4.5  CL 102  CO2 29  GLUCOSE 119*  BUN 15  CREATININE 0.77  CALCIUM 8.9   LFT Recent Labs    11/06/17 0722  PROT 6.5  ALBUMIN 3.6  AST 80*  ALT 64*  ALKPHOS 37*  BILITOT 0.7   PT/INR No results for input(s): LABPROT, INR in the last 72 hours.  Studies/Results: Dg Cholangiogram Operative  Result Date: 11/05/2017 CLINICAL DATA:  Cholecystectomy. EXAM: INTRAOPERATIVE CHOLANGIOGRAM TECHNIQUE: Cholangiographic images from the C-arm fluoroscopic device were submitted for interpretation post-operatively. Please see the procedural report for the amount of contrast and the fluoroscopy time utilized. COMPARISON:  03/10/2017 ultrasound. FINDINGS: Single image and a single run from an intraoperative cholangiogram submitted. Cholecystectomy. No contrast extravasation. Mild intrahepatic and borderline to mild extrahepatic biliary duct dilatation. Persistent filling defects within the distal common duct, suspicious for choledocholithiasis. IMPRESSION: Suspect distal choledocholithiasis. Artifact from air bubbles felt  less likely. Case discussed with Dr. Blima Singer at approximately 12:05 p.m. Electronically Signed   By: Jeronimo Greaves M.D.   On: 11/05/2017 12:22    Impression:  1.  Cholecystitis and  cholelithiasis, POD 1 cholecystectomy. 2.  Abnormal IOC, possible distal CBD stone(s). 3.  Mildly elevated LFTs. 4.  Cardiac disease with stent about 6 months ago, recently on ticagrelor.  Plan:  1.  There is no anesthesia availability for EUS +/- ERCP today, which would be my recommendation. 2.  OK for patient to be discharged home today, stay off ticagrelor, with plans for outpatient EUS +/- ERCP with me at [facility] next Wednesday April 3rd. 3.  Patient advised to seek care for escalating pain, fevers, jaundice. 4.  [facility] will sign-off; please call with questions; thank you for the consultation.   LOS: 0 days   Delta Pichon M  11/06/2017, 8:52 AM  Cell [phone] If no answer or after 5 PM call [phone]

## 2017-11-06 NOTE — Consult Note (Signed)
[facility] Consultation Note  Referring Provider: Dr. Darnell Level (CCS) Primary Care Physician:  Renford Dills, MD  Reason for Consultation:  Abnormal IOC  HPI: Carrie Barnes is a 82 y.o. female with history gallbladder symptoms for about 6 months.  Was in hospital in [facility] for this, cholecystectomy wasn't done due to patient on ticagrelor with recent cardiac stent.  Months passed, and once she was able to get off ticagrelor she had cholecystectomy yesterday.  IOC showed possible distal CBD filling defect(s).  Patient has no appreciable abdominal pain and today's LFTs mild transaminase elevation only.   Past Medical History:  Diagnosis Date  . Cholecystitis   . Coronary artery disease    a. 03/2017: 80-90% LAD stenosis (PCI/DES placement with a 2.75x16 mm Promus Premier stent). No significant stenosis along RCA or LCx.   Marland Kitchen DJD (degenerative joint disease)   . Hyperlipidemia   . Hypertension    dx s/p MI   . Ischemic cardiomyopathy   . Myocardial infarction [facility]) 03/17/2017   03-2017 treated at [facility]   . Osteoporosis   . Status post insertion of drug-eluting stent into left anterior descending (LAD) artery 03/2017    Past Surgical History:  Procedure Laterality Date  . APPENDECTOMY    . CESAREAN SECTION    . stent  Left 03/2017   anterior descending ; drug eluting ; tx of MI at [facility]   . TONSILLECTOMY    . VAGINAL HYSTERECTOMY    . vaginal sling      Prior to Admission medications   Medication Sig Start Date End Date Taking? Authorizing Provider  acetaminophen (TYLENOL) 500 MG tablet Take 500 mg by mouth daily as needed for moderate pain or headache.   Yes [provider]  alendronate (FOSAMAX) 70 MG tablet Take 70 mg by mouth once a week. Take in the morning with a full glass of water, on an empty stomach, and do not take anything else by mouth or lie down for the next 30 min.   Yes [provider]   aspirin EC 81 MG tablet Take 1 tablet (81 mg total) by mouth daily. 03/19/17 03/14/18 Yes [provider]  atorvastatin (LIPITOR) 80 MG tablet Take 1 tablet (80 mg total) by mouth daily. 03/31/17 03/26/18 Yes Dyann Kief, PA-C  calcium carbonate (OSCAL) 1500 (600 Ca) MG TABS tablet Take 600 mg of elemental calcium by mouth 2 (two) times daily with a meal.   Yes [provider]  cetirizine (ZYRTEC) 10 MG tablet Take 10 mg by mouth daily as needed for allergies.    Yes [provider]  Cholecalciferol (VITAMIN D3) 2000 units capsule Take 2,000 Units by mouth daily.   Yes [provider]  CRANBERRY EXTRACT PO Take 1 capsule by mouth daily.   Yes [provider]  losartan (COZAAR) 25 MG tablet Take 1 tablet (25 mg total) by mouth daily. 03/31/17 03/26/18 Yes Dyann Kief, PA-C  metoprolol succinate (TOPROL-XL) 50 MG 24 hr tablet Take 1 tablet (50 mg total) by mouth daily. 03/31/17 03/26/18 Yes Dyann Kief, PA-C  Multiple Vitamin (MULTI-VITAMINS) TABS Take 1 tablet by mouth daily.   Yes [provider]  nitroGLYCERIN (NITROSTAT) 0.4 MG SL tablet Place 1 tablet (0.4 mg total) under the tongue every 5 (five) minutes as needed for chest pain. 03/31/17  Yes Dyann Kief, PA-C  Omega-3 Fatty Acids (FISH OIL) 1200 MG CAPS Take 1,200 mg by  mouth 2 (two) times daily.    Yes [provider]  omeprazole (PRILOSEC) 20 MG capsule Take 20 mg by mouth daily.   Yes [provider]  Polyethyl Glycol-Propyl Glycol (SYSTANE OP) Place 1 drop into both eyes daily as needed (dry eyes).   Yes [provider]  traMADol (ULTRAM) 50 MG tablet Take 50 mg by mouth at bedtime as needed for moderate pain.    Yes [provider]  vitamin C (ASCORBIC ACID) 500 MG tablet Take 500 mg by mouth daily.   Yes [provider]  ticagrelor (BRILINTA) 90 MG TABS tablet Take 1 tablet (90 mg total) by mouth 2 (two) times daily. 03/31/17  03/26/18  Dyann Kief, PA-C    Current Facility-Administered Medications  Medication Dose Route Frequency Provider Last Rate Last Dose  . acetaminophen (TYLENOL) tablet 650 mg  650 mg Oral Q6H PRN Darnell Level, MD       Or  . acetaminophen (TYLENOL) suppository 650 mg  650 mg Rectal Q6H PRN Darnell Level, MD      . dextrose 5 % and 0.45 % NaCl with KCl 20 mEq/L infusion   Intravenous Continuous Darnell Level, MD 50 mL/hr at 11/05/17 1504    . HYDROcodone-acetaminophen (NORCO/VICODIN) 5-325 MG per tablet 1-2 tablet  1-2 tablet Oral Q4H PRN Darnell Level, MD   2 tablet at 11/05/17 2300  . HYDROmorphone (DILAUDID) injection 1 mg  1 mg Intravenous Q2H PRN Darnell Level, MD   1 mg at 11/05/17 1529  . losartan (COZAAR) tablet 25 mg  25 mg Oral Daily Darnell Level, MD      . metoprolol succinate (TOPROL-XL) 24 hr tablet 50 mg  50 mg Oral Daily Gerkin, Tawanna Cooler, MD      . nitroGLYCERIN (NITROSTAT) SL tablet 0.4 mg  0.4 mg Sublingual Q5 min PRN Darnell Level, MD      . ondansetron (ZOFRAN-ODT) disintegrating tablet 4 mg  4 mg Oral Q6H PRN Darnell Level, MD       Or  . ondansetron (ZOFRAN) injection 4 mg  4 mg Intravenous Q6H PRN Darnell Level, MD      . traMADol Janean Sark) tablet 50 mg  50 mg Oral Q6H PRN Darnell Level, MD        Allergies as of 10/19/2017  . (No Known Allergies)    Family History  Problem Relation Age of Onset  . Heart disease Father   . Breast cancer Sister   . Pancreatic cancer Sister     Social History   Socioeconomic History  . Marital status: Widowed    Spouse name: Not on file  . Number of children: 4  . Years of education: Not on file  . Highest education level: Not on file  Occupational History  . Occupation: Futures trader  . Occupation: Administrator  . Financial resource strain: Not on file  . Food insecurity:    Worry: Not on file    Inability: Not on file  . Transportation needs:    Medical: Not on file    Non-medical: Not on  file  Tobacco Use  . Smoking status: Former Smoker    Packs/day: 1.00    Years: 30.00    Pack years: 30.00    Types: Cigarettes  . Smokeless tobacco: Former Neurosurgeon    Quit date: 08/11/1986  . Tobacco comment: quit 30 years   Substance and Sexual Activity  . Alcohol use: Yes    Comment: Social  .  Drug use: Not on file  . Sexual activity: Not on file  Lifestyle  . Physical activity:    Days per week: Not on file    Minutes per session: Not on file  . Stress: Not on file  Relationships  . Social connections:    Talks on phone: Not on file    Gets together: Not on file    Attends religious service: Not on file    Active member of club or organization: Not on file    Attends meetings of clubs or organizations: Not on file    Relationship status: Not on file  . Intimate partner violence:    Fear of current or ex partner: Not on file    Emotionally abused: Not on file    Physically abused: Not on file    Forced sexual activity: Not on file  Other Topics Concern  . Not on file  Social History Narrative  . Not on file    Review of Systems: As per HPI, all others negative  Physical Exam: Vital signs in last 24 hours: Temp:  [97.8 F (36.6 C)-98.8 F (37.1 C)] 98.1 F (36.7 C) (03/29 0405) Pulse Rate:  [61-103] 70 (03/29 0405) Resp:  [10-18] 18 (03/29 0405) BP: (102-144)/(43-73) 110/50 (03/29 0413) SpO2:  [94 %-100 %] 94 % (03/29 0405) Weight:  [151 lb (68.5 kg)] 151 lb (68.5 kg) (03/28 0933) Last BM Date: 11/04/17 General:   Alert, overweight, Well-developed, well-nourished, pleasant and cooperative in NAD Head:  Normocephalic and atraumatic. Eyes:  Sclera clear, no icterus.   Conjunctiva pink. Ears:  Normal auditory acuity. Nose:  No deformity, discharge,  or lesions. Mouth:  No deformity or lesions.  Oropharynx pink & moist. Neck:  Supple; no masses or thyromegaly. Lungs:  Clear throughout to auscultation.   No wheezes, crackles, or rhonchi. No acute distress. Heart:   Regular rate and rhythm; no murmurs, clicks, rubs,  or gallops. Abdomen:  Soft, mild post-operative tenderness, no peritonitis, nondistended. No masses, hepatosplenomegaly or hernias noted. Normal bowel sounds, without guarding, and without rebound.     Msk:  Symmetrical without gross deformities. Normal posture. Pulses:  Normal pulses noted. Extremities:  Without clubbing or edema. Neurologic:  Alert and  oriented x4;  grossly normal neurologically. Skin:  Intact without significant lesions or rashes. Psych:  Alert and cooperative. Normal mood and affect.   Lab Results: Recent Labs    11/06/17 0722  WBC 9.9  HGB 10.1*  HCT 30.2*  PLT 155   BMET Recent Labs    11/06/17 0722  NA 138  K 4.5  CL 102  CO2 29  GLUCOSE 119*  BUN 15  CREATININE 0.77  CALCIUM 8.9   LFT Recent Labs    11/06/17 0722  PROT 6.5  ALBUMIN 3.6  AST 80*  ALT 64*  ALKPHOS 37*  BILITOT 0.7   PT/INR No results for input(s): LABPROT, INR in the last 72 hours.  Studies/Results: Dg Cholangiogram Operative  Result Date: 11/05/2017 CLINICAL DATA:  Cholecystectomy. EXAM: INTRAOPERATIVE CHOLANGIOGRAM TECHNIQUE: Cholangiographic images from the C-arm fluoroscopic device were submitted for interpretation post-operatively. Please see the procedural report for the amount of contrast and the fluoroscopy time utilized. COMPARISON:  03/10/2017 ultrasound. FINDINGS: Single image and a single run from an intraoperative cholangiogram submitted. Cholecystectomy. No contrast extravasation. Mild intrahepatic and borderline to mild extrahepatic biliary duct dilatation. Persistent filling defects within the distal common duct, suspicious for choledocholithiasis. IMPRESSION: Suspect distal choledocholithiasis. Artifact from air bubbles felt  less likely. Case discussed with Dr. Blima Singer at approximately 12:05 p.m. Electronically Signed   By: Jeronimo Greaves M.D.   On: 11/05/2017 12:22    Impression:  1.  Cholecystitis and  cholelithiasis, POD 1 cholecystectomy. 2.  Abnormal IOC, possible distal CBD stone(s). 3.  Mildly elevated LFTs. 4.  Cardiac disease with stent about 6 months ago, recently on ticagrelor.  Plan:  1.  There is no anesthesia availability for EUS +/- ERCP today, which would be my recommendation. 2.  OK for patient to be discharged home today, stay off ticagrelor, with plans for outpatient EUS +/- ERCP with me at [facility] next Wednesday April 3rd. 3.  Patient advised to seek care for escalating pain, fevers, jaundice. 4.  [facility] will sign-off; please call with questions; thank you for the consultation.   LOS: 0 days   Barbarann Kelly M  11/06/2017, 8:52 AM  Cell [phone] If no answer or after 5 PM call [phone]

## 2017-11-06 NOTE — Progress Notes (Signed)
Pt BP: 107/49 and pulse 60. Pt had metoprolol and Losartan ordered MD notified of BP and heart rate and said to give medication.  Arminda Resides RN

## 2017-11-06 NOTE — Discharge Summary (Signed)
Physician Discharge Summary [facility]  Patient ID: Carrie Barnes MRN: [id number] DOB/AGE: [birth year] 82 y.o.  Admit date: 11/05/2017 Discharge date: 11/06/2017  Admission Diagnoses:  Chronic cholecystitis, cholelithiasis  Discharge Diagnoses:  Principal Problem:   Cholelithiasis with chronic cholecystitis Active Problems:   Chronic cholecystitis due to cholelithiasis with choledocholithiasis   Discharged Condition: good  Hospital Course: Patient was admitted for observation following gallbladder surgery.  Post op course was uncomplicated.  Pain was well controlled.  Tolerated diet.  Patient was seen by Dr. Dulce Sellar from GI in consultation.  Further studies for possible CBD stones were arranged for next week.  Patient was prepared for discharge home on POD#1.  Consults: GI  Treatments: surgery: lap chole with IOC  Discharge Exam: Blood pressure (!) 107/49, pulse 60, temperature 98.1 F (36.7 C), temperature source Oral, resp. rate 18, height 5\' 2"  (1.575 m), weight 68.5 kg (151 lb), SpO2 97 %. HEENT - clear Neck - soft Chest - clear bilaterally Cor - RRR Abd - soft without distension; dressings dry and intact  Disposition: Home  Discharge Instructions    Diet - low sodium heart healthy   Complete by:  As directed    Discharge instructions   Complete by:  As directed    [facility]  LAPAROSCOPIC SURGERY:  POST-OP INSTRUCTIONS  Always review your discharge instruction sheet given to you by the facility where your surgery was performed.  A prescription for pain medication may be given to you upon discharge.  Take your pain medication as prescribed.  If narcotic pain medicine is not needed, then you may take acetaminophen (Tylenol) or ibuprofen (Advil) as needed.  Take your usually prescribed medications unless otherwise directed.  If you need a refill on your pain medication, please contact your pharmacy.  They will contact our  office to request authorization. Prescriptions will not be filled after 5 P.M. or on weekends.  You should follow a light diet the first few days after arrival home, such as soup and crackers or toast.  Be sure to include plenty of fluids daily.  Most patients will experience some swelling and bruising in the area of the incisions.  Ice packs will help.  Swelling and bruising can take several days to resolve.   It is common to experience some constipation after surgery.  Increasing fluid intake and taking a stool softener (such as Colace) will usually help or prevent this problem from occurring.  A mild laxative (Milk of Magnesia or Miralax) should be taken according to package instructions if there has been no bowel movement after 48 hours.  You will have steri-strips and a gauze dressing over your incisions.  You may remove the gauze bandage on the second day after surgery, and you may shower at that time.  Leave your steri-strips (small skin tapes) in place directly over the incision.  These strips should remain on the skin for 5-7 days and then be removed.  You may get them wet in the shower and pat them dry.  Any sutures or staples will be removed at the office during your follow-up visit.  ACTIVITIES:  You may resume regular (light) daily activities beginning the next day - such as daily self-care, walking, climbing stairs - gradually increasing activities as tolerated.  You may have sexual intercourse when it is comfortable.  Refrain from any heavy lifting or straining until approved by your doctor.  You may drive when you are no longer taking prescription  pain medication, you can comfortably wear a seatbelt, and you can safely maneuver your car and apply brakes.  You should see your doctor in the office for a follow-up appointment approximately 2-3 weeks after your surgery.  Make sure that you call for this appointment within a day or two after you arrive home to insure a convenient  appointment time.  WHEN TO CALL YOUR DOCTOR: Fever over 101.0 Inability to urinate Continued bleeding from incision Increased pain, redness, or drainage from the incision Increasing abdominal pain  The clinic staff is available to answer your questions during regular business hours.  Please don't hesitate to call and ask to speak to one of the nurses for clinical concerns.  If you have a medical emergency, go to the nearest emergency room or call 911.  A surgeon from [facility] is always on call for the hospital.  Velora Heckler, MD, [facility] Office: [phone] Toll Free:  [phone] FAX [phone]  Website: [email or web address]   Increase activity slowly   Complete by:  As directed    Remove dressing in 24 hours   Complete by:  As directed      Allergies as of 11/06/2017   No Known Allergies     Medication List    TAKE these medications   acetaminophen 500 MG tablet Commonly known as:  TYLENOL Take 500 mg by mouth daily as needed for moderate pain or headache.   alendronate 70 MG tablet Commonly known as:  FOSAMAX Take 70 mg by mouth once a week. Take in the morning with a full glass of water, on an empty stomach, and do not take anything else by mouth or lie down for the next 30 min.   aspirin EC 81 MG tablet Take 1 tablet (81 mg total) by mouth daily.   atorvastatin 80 MG tablet Commonly known as:  LIPITOR Take 1 tablet (80 mg total) by mouth daily.   calcium carbonate 1500 (600 Ca) MG Tabs tablet Commonly known as:  OSCAL Take 600 mg of elemental calcium by mouth 2 (two) times daily with a meal.   cetirizine 10 MG tablet Commonly known as:  ZYRTEC Take 10 mg by mouth daily as needed for allergies.   CRANBERRY EXTRACT PO Take 1 capsule by mouth daily.   Fish Oil 1200 MG Caps Take 1,200 mg by mouth 2 (two) times daily.   losartan 25 MG tablet Commonly known as:  COZAAR Take 1 tablet (25 mg  total) by mouth daily.   metoprolol succinate 50 MG 24 hr tablet Commonly known as:  TOPROL-XL Take 1 tablet (50 mg total) by mouth daily.   MULTI-VITAMINS Tabs Take 1 tablet by mouth daily.   nitroGLYCERIN 0.4 MG SL tablet Commonly known as:  NITROSTAT Place 1 tablet (0.4 mg total) under the tongue every 5 (five) minutes as needed for chest pain.   omeprazole 20 MG capsule Commonly known as:  PRILOSEC Take 20 mg by mouth daily.   SYSTANE OP Place 1 drop into both eyes daily as needed (dry eyes).   ticagrelor 90 MG Tabs tablet Commonly known as:  BRILINTA Take 1 tablet (90 mg total) by mouth 2 (two) times daily.   traMADol 50 MG tablet Commonly known as:  ULTRAM Take 50 mg by mouth at bedtime as needed for moderate pain. What changed:  Another medication with the same name was added. Make sure you understand how and when to take each.   traMADol  50 MG tablet Commonly known as:  ULTRAM Take 1-2 tablets (50-100 mg total) by mouth every 6 (six) hours as needed for moderate pain (mild pain, refractory to tylenol). What changed:  You were already taking a medication with the same name, and this prescription was added. Make sure you understand how and when to take each.   vitamin C 500 MG tablet Commonly known as:  ASCORBIC ACID Take 500 mg by mouth daily.   Vitamin D3 2000 units capsule Take 2,000 Units by mouth daily.        Velora Heckler, MD, [facility] Office: [phone]   Signed: Velora Heckler 11/06/2017, 11:30 AM

## 2017-11-06 NOTE — Anesthesia Postprocedure Evaluation (Signed)
Anesthesia Post Note  Patient: Carrie Barnes  Procedure(s) Performed: LAPAROSCOPIC CHOLECYSTECTOMY WITH INTRAOPERATIVE CHOLANGIOGRAM (N/A Abdomen)     Patient location during evaluation: PACU Anesthesia Type: General Level of consciousness: awake and alert Pain management: pain level controlled Vital Signs Assessment: post-procedure vital signs reviewed and stable Respiratory status: spontaneous breathing, nonlabored ventilation, respiratory function stable and patient connected to nasal cannula oxygen Cardiovascular status: blood pressure returned to baseline and stable Postop Assessment: no apparent nausea or vomiting Anesthetic complications: no    Last Vitals:  Vitals:   11/06/17 0413 11/06/17 0905  BP: (!) 110/50 (!) 107/49  Pulse:  60  Resp:    Temp:    SpO2:  97%    Last Pain:  Vitals:   11/06/17 0714  TempSrc:   PainSc: 0-No pain                 [name] S

## 2017-11-07 ENCOUNTER — Encounter (HOSPITAL_COMMUNITY): Payer: Self-pay | Admitting: Surgery

## 2017-11-09 ENCOUNTER — Other Ambulatory Visit: Payer: Self-pay

## 2017-11-09 ENCOUNTER — Encounter (HOSPITAL_COMMUNITY): Payer: Self-pay | Admitting: *Deleted

## 2017-11-11 ENCOUNTER — Ambulatory Visit (HOSPITAL_COMMUNITY): Payer: Medicare Other | Admitting: Certified Registered Nurse Anesthetist

## 2017-11-11 ENCOUNTER — Encounter (HOSPITAL_COMMUNITY): Payer: Self-pay | Admitting: *Deleted

## 2017-11-11 ENCOUNTER — Other Ambulatory Visit: Payer: Self-pay | Admitting: Gastroenterology

## 2017-11-11 ENCOUNTER — Ambulatory Visit (HOSPITAL_COMMUNITY)
Admission: RE | Admit: 2017-11-11 | Discharge: 2017-11-11 | Disposition: A | Discharge status: Home / Self Care | Payer: Medicare Other | Source: Ambulatory Visit | Attending: Gastroenterology | Admitting: Gastroenterology

## 2017-11-11 ENCOUNTER — Ambulatory Visit (HOSPITAL_COMMUNITY): Payer: Medicare Other

## 2017-11-11 ENCOUNTER — Other Ambulatory Visit: Payer: Self-pay

## 2017-11-11 ENCOUNTER — Encounter (HOSPITAL_COMMUNITY)
Admission: RE | Disposition: A | Discharge status: Home / Self Care | Payer: Self-pay | Source: Ambulatory Visit | Attending: Gastroenterology

## 2017-11-11 DIAGNOSIS — I119 Hypertensive heart disease without heart failure: Secondary | ICD-10-CM | POA: Insufficient documentation

## 2017-11-11 DIAGNOSIS — K219 Gastro-esophageal reflux disease without esophagitis: Secondary | ICD-10-CM | POA: Diagnosis not present

## 2017-11-11 DIAGNOSIS — Z955 Presence of coronary angioplasty implant and graft: Secondary | ICD-10-CM | POA: Insufficient documentation

## 2017-11-11 DIAGNOSIS — M810 Age-related osteoporosis without current pathological fracture: Secondary | ICD-10-CM | POA: Insufficient documentation

## 2017-11-11 DIAGNOSIS — I10 Essential (primary) hypertension: Secondary | ICD-10-CM | POA: Diagnosis not present

## 2017-11-11 DIAGNOSIS — I252 Old myocardial infarction: Secondary | ICD-10-CM | POA: Diagnosis not present

## 2017-11-11 DIAGNOSIS — I2510 Atherosclerotic heart disease of native coronary artery without angina pectoris: Secondary | ICD-10-CM | POA: Insufficient documentation

## 2017-11-11 DIAGNOSIS — E785 Hyperlipidemia, unspecified: Secondary | ICD-10-CM | POA: Diagnosis not present

## 2017-11-11 DIAGNOSIS — R748 Abnormal levels of other serum enzymes: Secondary | ICD-10-CM | POA: Insufficient documentation

## 2017-11-11 DIAGNOSIS — M1990 Unspecified osteoarthritis, unspecified site: Secondary | ICD-10-CM | POA: Diagnosis not present

## 2017-11-11 DIAGNOSIS — Z79899 Other long term (current) drug therapy: Secondary | ICD-10-CM | POA: Diagnosis not present

## 2017-11-11 DIAGNOSIS — K8050 Calculus of bile duct without cholangitis or cholecystitis without obstruction: Secondary | ICD-10-CM | POA: Insufficient documentation

## 2017-11-11 DIAGNOSIS — Z7983 Long term (current) use of bisphosphonates: Secondary | ICD-10-CM | POA: Diagnosis not present

## 2017-11-11 DIAGNOSIS — Z87891 Personal history of nicotine dependence: Secondary | ICD-10-CM | POA: Insufficient documentation

## 2017-11-11 HISTORY — PX: ERCP: SHX5425

## 2017-11-11 HISTORY — PX: EUS: SHX5427

## 2017-11-11 SURGERY — UPPER ENDOSCOPIC ULTRASOUND (EUS) RADIAL
Anesthesia: Monitor Anesthesia Care

## 2017-11-11 MED ORDER — INDOMETHACIN 50 MG RE SUPP: 50.0000 mg | Freq: Once | RECTAL | Status: AC

## 2017-11-11 MED ORDER — ASPIRIN EC 81 MG PO TBEC: 81.0000 mg | DELAYED_RELEASE_TABLET | Freq: Every day | ORAL | 11 refills | Status: AC

## 2017-11-11 MED ORDER — EPINEPHRINE PF 1 MG/10ML IJ SOSY
PREFILLED_SYRINGE | INTRAMUSCULAR | Status: AC
  Filled 2017-11-11: qty 10

## 2017-11-11 MED ORDER — FENTANYL CITRATE (PF) 100 MCG/2ML IJ SOLN
INTRAMUSCULAR | Status: DC | PRN
  Administered 2017-11-11: 25 ug via INTRAVENOUS

## 2017-11-11 MED ORDER — GLUCAGON HCL RDNA (DIAGNOSTIC) 1 MG IJ SOLR
INTRAMUSCULAR | Status: AC
  Filled 2017-11-11: qty 1

## 2017-11-11 MED ORDER — INDOMETHACIN 50 MG RE SUPP
RECTAL | Status: DC | PRN
  Administered 2017-11-11: 100 mg via RECTAL

## 2017-11-11 MED ORDER — SUGAMMADEX SODIUM 200 MG/2ML IV SOLN
INTRAVENOUS | Status: DC | PRN
  Administered 2017-11-11: 150 mg via INTRAVENOUS

## 2017-11-11 MED ORDER — ROCURONIUM BROMIDE 100 MG/10ML IV SOLN
INTRAVENOUS | Status: DC | PRN
  Administered 2017-11-11: 20 mg via INTRAVENOUS

## 2017-11-11 MED ORDER — PHENYLEPHRINE HCL 10 MG/ML IJ SOLN
INTRAMUSCULAR | Status: DC | PRN
  Administered 2017-11-11 (×4): 80 ug via INTRAVENOUS

## 2017-11-11 MED ORDER — PROPOFOL 10 MG/ML IV BOLUS
INTRAVENOUS | Status: AC
  Filled 2017-11-11: qty 20

## 2017-11-11 MED ORDER — SODIUM CHLORIDE 0.9 % IV SOLN: INTRAVENOUS | Status: DC

## 2017-11-11 MED ORDER — CIPROFLOXACIN IN D5W 400 MG/200ML IV SOLN
INTRAVENOUS | Status: AC
  Filled 2017-11-11: qty 200

## 2017-11-11 MED ORDER — SODIUM CHLORIDE 0.9 % IV SOLN
INTRAVENOUS | Status: DC | PRN
  Administered 2017-11-11: 40 mL

## 2017-11-11 MED ORDER — PROPOFOL 500 MG/50ML IV EMUL
INTRAVENOUS | Status: DC | PRN
  Administered 2017-11-11: 100 ug/kg/min via INTRAVENOUS

## 2017-11-11 MED ORDER — INDOMETHACIN 50 MG RE SUPP
RECTAL | Status: AC
  Filled 2017-11-11: qty 2

## 2017-11-11 MED ORDER — LACTATED RINGERS IV SOLN
INTRAVENOUS | Status: DC
  Administered 2017-11-11: 1000 mL via INTRAVENOUS

## 2017-11-11 MED ORDER — ONDANSETRON HCL 4 MG/2ML IJ SOLN
INTRAMUSCULAR | Status: DC | PRN
  Administered 2017-11-11: 4 mg via INTRAVENOUS

## 2017-11-11 MED ORDER — ESMOLOL HCL 100 MG/10ML IV SOLN
INTRAVENOUS | Status: DC | PRN
  Administered 2017-11-11: 20 mg via INTRAVENOUS

## 2017-11-11 MED ORDER — TICAGRELOR 90 MG PO TABS: 90.0000 mg | ORAL_TABLET | Freq: Two times a day (BID) | ORAL | 3 refills | Status: DC

## 2017-11-11 MED ORDER — LIDOCAINE 2% (20 MG/ML) 5 ML SYRINGE
INTRAMUSCULAR | Status: DC | PRN
  Administered 2017-11-11: 80 mg via INTRAVENOUS

## 2017-11-11 MED ORDER — CIPROFLOXACIN IN D5W 400 MG/200ML IV SOLN
400.0000 mg | Freq: Once | INTRAVENOUS | Status: AC
  Administered 2017-11-11: 400 mg via INTRAVENOUS

## 2017-11-11 MED ORDER — EPHEDRINE SULFATE 50 MG/ML IJ SOLN
INTRAMUSCULAR | Status: DC | PRN
  Administered 2017-11-11: 5 mg via INTRAVENOUS

## 2017-11-11 MED ORDER — SUCCINYLCHOLINE CHLORIDE 20 MG/ML IJ SOLN
INTRAMUSCULAR | Status: DC | PRN
  Administered 2017-11-11: 120 mg via INTRAVENOUS

## 2017-11-11 MED ORDER — SODIUM CHLORIDE 0.9 % IJ SOLN
PREFILLED_SYRINGE | INTRAMUSCULAR | Status: DC | PRN
  Administered 2017-11-11: 10 mL

## 2017-11-11 MED ORDER — FENTANYL CITRATE (PF) 100 MCG/2ML IJ SOLN
INTRAMUSCULAR | Status: AC
  Filled 2017-11-11: qty 2

## 2017-11-11 MED ORDER — PROPOFOL 10 MG/ML IV BOLUS
INTRAVENOUS | Status: DC | PRN
  Administered 2017-11-11: 100 mg via INTRAVENOUS

## 2017-11-11 NOTE — Anesthesia Procedure Notes (Signed)
Procedure Name: Intubation Date/Time: 11/11/2017 9:51 AM Performed by: West Pugh, CRNA Pre-anesthesia Checklist: Patient identified, Emergency Drugs available, Suction available, Patient being monitored and Timeout performed Patient Re-evaluated:Patient Re-evaluated prior to induction Oxygen Delivery Method: Circle system utilized Preoxygenation: Pre-oxygenation with 100% oxygen Induction Type: IV induction, Rapid sequence and Cricoid Pressure applied Ventilation: Mask ventilation without difficulty Laryngoscope Size: Mac and 3 Grade View: Grade I Tube type: Oral Tube size: 7.5 mm Number of attempts: 1 Airway Equipment and Method: Stylet Placement Confirmation: ETT inserted through vocal cords under direct vision,  positive ETCO2,  CO2 detector and breath sounds checked- equal and bilateral Secured at: 21 cm Tube secured with: Tape Dental Injury: Teeth and Oropharynx as per pre-operative assessment

## 2017-11-11 NOTE — Discharge Instructions (Signed)
YOU HAD AN ENDOSCOPIC PROCEDURE TODAY: Refer to the procedure report and other information in the discharge instructions given to you for any specific questions about what was found during the examination. If this information does not answer your questions, please call [facility] office at [phone] to clarify.   YOU SHOULD EXPECT: Some feelings of bloating in the abdomen. Passage of more gas than usual. Walking can help get rid of the air that was put into your GI tract during the procedure and reduce the bloating. If you had a lower endoscopy (such as a colonoscopy or flexible sigmoidoscopy) you may notice spotting of blood in your stool or on the toilet paper. Some abdominal soreness may be present for a day or two, also.  DIET: Your first meal following the procedure should be a light meal and then it is ok to progress to your normal diet. A half-sandwich or bowl of soup is an example of a good first meal. Heavy or fried foods are harder to digest and may make you feel nauseous or bloated. Drink plenty of fluids but you should avoid alcoholic beverages for 24 hours. If you had a esophageal dilation, please see attached instructions for diet.   ACTIVITY: Your care partner should take you home directly after the procedure. You should plan to take it easy, moving slowly for the rest of the day. You can resume normal activity the day after the procedure however YOU SHOULD NOT DRIVE, use power tools, machinery or perform tasks that involve climbing or major physical exertion for 24 hours (because of the sedation medicines used during the test).   SYMPTOMS TO REPORT IMMEDIATELY: A gastroenterologist can be reached at any hour. Please call [phone]  for any of the following symptoms:   Following upper endoscopy (EGD, EUS, ERCP, esophageal dilation) Vomiting of blood or coffee ground material  New, significant abdominal pain  New, significant chest pain or pain under the shoulder blades  Painful or  persistently difficult swallowing  New shortness of breath  Black, tarry-looking or red, bloody stools  FOLLOW UP:  If any biopsies were taken you will be contacted by phone or by letter within the next 1-3 weeks. Call [phone]  if you have not heard about the biopsies in 3 weeks.  Please also call with any specific questions about appointments or follow up tests.   General Anesthesia, Adult, Care After These instructions provide you with information about caring for yourself after your procedure. Your health care provider may also give you more specific instructions. Your treatment has been planned according to current medical practices, but problems sometimes occur. Call your health care provider if you have any problems or questions after your procedure. What can I expect after the procedure? After the procedure, it is common to have:  Vomiting.  A sore throat.  Mental slowness.  It is common to feel:  Nauseous.  Cold or shivery.  Sleepy.  Tired.  Sore or achy, even in parts of your body where you did not have surgery.  Follow these instructions at home: For at least 24 hours after the procedure:  Do not: ? Participate in activities where you could fall or become injured. ? Drive. ? Use heavy machinery. ? Drink alcohol. ? Take sleeping pills or medicines that cause drowsiness. ? Make important decisions or sign legal documents. ? Take care of children on your own.  Rest. Eating and drinking  If you vomit, drink water, juice, or soup when you can drink  without vomiting.  Drink enough fluid to keep your urine clear or pale yellow.  Make sure you have little or no nausea before eating solid foods.  Follow the diet recommended by your health care provider. General instructions  Have a responsible adult stay with you until you are awake and alert.  Return to your normal activities as told by your health care provider. Ask your health care provider what  activities are safe for you.  Take over-the-counter and prescription medicines only as told by your health care provider.  If you smoke, do not smoke without supervision.  Keep all follow-up visits as told by your health care provider. This is important. Contact a health care provider if:  You continue to have nausea or vomiting at home, and medicines are not helpful.  You cannot drink fluids or start eating again.  You cannot urinate after 8-12 hours.  You develop a skin rash.  You have fever.  You have increasing redness at the site of your procedure. Get help right away if:  You have difficulty breathing.  You have chest pain.  You have unexpected bleeding.  You feel that you are having a life-threatening or urgent problem. This information is not intended to replace advice given to you by your health care provider. Make sure you discuss any questions you have with your health care provider. Document Released: 11/03/2000 Document Revised: 12/31/2015 Document Reviewed: 07/12/2015 Elsevier Interactive Patient Education  [employer].

## 2017-11-11 NOTE — Op Note (Signed)
[facility] Patient Name: Carrie Barnes Procedure Date: 11/11/2017 MRN: [id number] Attending MD: Willis Modena , MD Date of Birth: Mar 26, 1936 CSN: [id number] Age: 82 Admit Type: Outpatient Procedure:                Upper EUS Indications:              Elevated liver enzymes, abnormal cholangiogram                            (possible distal bile duct stones) Providers:                Willis Modena, MD, Dwain Sarna, RN, Arlee Muslim Tech., Technician, Kym Groom, CRNA Referring MD:             Darnell Level, MD (CCS) Medicines:                Monitored Anesthesia Care Complications:            No immediate complications. Estimated Blood Loss:     Estimated blood loss: none. Procedure:                Pre-Anesthesia Assessment:                           - Prior to the procedure, a History and Physical                            was performed, and patient medications and                            allergies were reviewed. The patient's tolerance of                            previous anesthesia was also reviewed. The risks                            and benefits of the procedure and the sedation                            options and risks were discussed with the patient.                            All questions were answered, and informed consent                            was obtained. Prior Anticoagulants: The patient has                            taken Brilinta, last dose was 14 days prior to                            procedure. ASA Grade Assessment: III - A patient  with severe systemic disease. After reviewing the                            risks and benefits, the patient was deemed in                            satisfactory condition to undergo the procedure.                           After obtaining informed consent, the endoscope was                            passed under direct vision. Throughout the                           procedure, the patient's blood pressure, pulse, and                            oxygen saturations were monitored continuously. The                            [id number] ([id number]) scope was introduced through                            the mouth, and advanced to the second part of                            duodenum. The upper EUS was accomplished without                            difficulty. The patient tolerated the procedure                            well. Scope In: Scope Out: Findings:      ENDOSONOGRAPHIC FINDING: :      Two stones were visualized endosonographically in the common bile duct.       The stones were triangular. They were hyperechoic and characterized by       shadowing. Impression:               - Two stones were visualized endosonographically in                            the common bile duct.                           - No specimens collected. Moderate Sedation:      N/A- Per Anesthesia Care Recommendation:           - Perform an ERCP today. Procedure Code(s):        --- Professional ---                           [phone], Esophagogastroduodenoscopy, flexible,  transoral; with endoscopic ultrasound examination                            limited to the esophagus, stomach or duodenum, and                            adjacent structures Diagnosis Code(s):        --- Professional ---                           K80.50, Calculus of bile duct without cholangitis                            or cholecystitis without obstruction                           R74.8, Abnormal levels of other serum enzymes CPT copyright 2017 American Medical Association. All rights reserved. The codes documented in this report are preliminary and upon coder review may  be revised to meet current compliance requirements. Willis Modena, MD 11/11/2017 9:53:28 AM This report has been signed electronically. Number of Addenda: 0

## 2017-11-11 NOTE — Interval H&P Note (Signed)
History and Physical Interval Note:  11/11/2017 8:42 AM  Carrie Barnes  has presented today for surgery, with the diagnosis of common bile duct stone  The various methods of treatment have been discussed with the patient and family. After consideration of risks, benefits and other options for treatment, the patient has consented to  Procedure(s) with comments: UPPER ENDOSCOPIC ULTRASOUND (EUS) RADIAL (N/A) ENDOSCOPIC RETROGRADE CHOLANGIOPANCREATOGRAPHY (ERCP) (N/A) - possible as a surgical intervention .  The patient's history has been reviewed, patient examined, no change in status, stable for surgery.  I have reviewed the patient's chart and labs.  Questions were answered to the patient's satisfaction.     Freddy Jaksch

## 2017-11-11 NOTE — Transfer of Care (Signed)
Immediate Anesthesia Transfer of Care Note  Patient: Carrie Barnes  Procedure(s) Performed: UPPER ENDOSCOPIC ULTRASOUND (EUS) RADIAL (N/A ) ENDOSCOPIC RETROGRADE CHOLANGIOPANCREATOGRAPHY (ERCP) (N/A )  Patient Location: PACU  Anesthesia Type:General  Level of Consciousness: awake, alert , oriented and patient cooperative  Airway & Oxygen Therapy: Patient Spontanous Breathing on face mask  Post-op Assessment: Report given to RN and Post -op Vital signs reviewed and stable  Post vital signs: Reviewed and stable  Last Vitals:  Vitals Value Taken Time  BP 134/62 11/11/2017 11:03 AM  Temp    Pulse 79 11/11/2017 11:07 AM  Resp 13 11/11/2017 11:07 AM  SpO2 99 % 11/11/2017 11:07 AM  Vitals shown include unvalidated device data.  Last Pain:  Vitals:   11/11/17 0829  TempSrc: Oral  PainSc: 0-No pain         Complications: No apparent anesthesia complications

## 2017-11-11 NOTE — Anesthesia Postprocedure Evaluation (Signed)
Anesthesia Post Note  Patient: Carrie Barnes  Procedure(s) Performed: UPPER ENDOSCOPIC ULTRASOUND (EUS) RADIAL (N/A ) ENDOSCOPIC RETROGRADE CHOLANGIOPANCREATOGRAPHY (ERCP) (N/A )     Patient location during evaluation: PACU Anesthesia Type: MAC Level of consciousness: awake and alert Pain management: pain level controlled Vital Signs Assessment: post-procedure vital signs reviewed and stable Respiratory status: spontaneous breathing, nonlabored ventilation and respiratory function stable Cardiovascular status: stable and blood pressure returned to baseline Anesthetic complications: no    Last Vitals:  Vitals:   11/11/17 1130 11/11/17 1140  BP: (!) 144/55 (!) 140/58  Pulse: 74   Resp: 12   Temp:    SpO2: 97%     Last Pain:  Vitals:   11/11/17 1140  TempSrc:   PainSc: 0-No pain                 Audry Pili

## 2017-11-11 NOTE — Anesthesia Preprocedure Evaluation (Addendum)
Anesthesia Evaluation  Patient identified by MRN, date of birth, ID band Patient awake    Reviewed: Allergy & Precautions, NPO status , Patient's Chart, lab work & pertinent test results, reviewed documented beta blocker date and time   Airway Mallampati: I  TM Distance: >3 FB Neck ROM: Full    Dental no notable dental hx. (+) Edentulous Upper, Edentulous Lower   Pulmonary former smoker,    Pulmonary exam normal breath sounds clear to auscultation       Cardiovascular hypertension, Pt. on medications and Pt. on home beta blockers + CAD, + Past MI and + Cardiac Stents  + Valvular Problems/Murmurs MR  Rhythm:Regular Rate:Normal + Systolic murmurs '18 TTE - mild LVH. EF 55% to 60%. Grade 1 diastolic dysfunction. Mild MR. A trivial pericardial effusion was   identified.   Neuro/Psych negative neurological ROS  negative psych ROS   GI/Hepatic Neg liver ROS, GERD  Controlled and Medicated,  Endo/Other  negative endocrine ROS  Renal/GU negative Renal ROS  negative genitourinary   Musculoskeletal  (+) Arthritis ,   Abdominal   Peds negative pediatric ROS (+)  Hematology  (+) anemia ,   Anesthesia Other Findings   Reproductive/Obstetrics negative OB ROS                            Anesthesia Physical  Anesthesia Plan  ASA: III  Anesthesia Plan: MAC   Post-op Pain Management:    Induction: Intravenous  PONV Risk Score and Plan: 3 and Treatment may vary due to age or medical condition and Propofol infusion  Airway Management Planned: Nasal Cannula  Additional Equipment: None  Intra-op Plan:   Post-operative Plan:   Informed Consent: I have reviewed the patients History and Physical, chart, labs and discussed the procedure including the risks, benefits and alternatives for the proposed anesthesia with the patient or authorized representative who has indicated his/her understanding and  acceptance.   Dental advisory given  Plan Discussed with: CRNA, Anesthesiologist and Surgeon  Anesthesia Plan Comments: (Will begin procedure as MAC. If plan to do ERCP, will pause procedure to induce GA and intubate. )      Anesthesia Quick Evaluation

## 2017-11-11 NOTE — Op Note (Signed)
[facility] Patient Name: Carrie Barnes Procedure Date: 11/11/2017 MRN: [id number] Attending MD: Willis Modena , MD Date of Birth: 23-Sep-1935 CSN: [id number] Age: 82 Admit Type: Outpatient Procedure:                ERCP Indications:              Bile duct stone(s) Providers:                Willis Modena, MD, Dwain Sarna, RN, Arlee Muslim Tech., Technician, Kym Groom, CRNA Referring MD:              Medicines:                General Anesthesia, Cipro 400 mg IV, Indomethacin                            100 mg PR Complications:            No immediate complications. Estimated Blood Loss:     Estimated blood loss was minimal. Procedure:                Pre-Anesthesia Assessment:                           - Prior to the procedure, a History and Physical                            was performed, and patient medications and                            allergies were reviewed. The patient's tolerance of                            previous anesthesia was also reviewed. The risks                            and benefits of the procedure and the sedation                            options and risks were discussed with the patient.                            All questions were answered, and informed consent                            was obtained. Prior Anticoagulants: The patient has                            taken Brilinta, last dose was 14 days prior to                            procedure. ASA Grade Assessment: III - A patient  with severe systemic disease. After reviewing the                            risks and benefits, the patient was deemed in                            satisfactory condition to undergo the procedure.                           - Prior to the procedure, a History and Physical                            was performed, and patient medications and                            allergies were reviewed.  The patient's tolerance of                            previous anesthesia was also reviewed. The risks                            and benefits of the procedure and the sedation                            options and risks were discussed with the patient.                            All questions were answered, and informed consent                            was obtained. Prior Anticoagulants: The patient has                            taken Brilinta, last dose was 14 days prior to                            procedure. ASA Grade Assessment: III - A patient                            with severe systemic disease. After reviewing the                            risks and benefits, the patient was deemed in                            satisfactory condition to undergo the procedure.                           After obtaining informed consent, the scope was                            passed under direct vision. Throughout the  procedure, the patient's blood pressure, pulse, and                            oxygen saturations were monitored continuously. The                            [id number] scope was introduced through the                            mouth, and used to inject contrast into and used to                            inject contrast into the bile duct. The ERCP was                            accomplished without difficulty. The patient                            tolerated the procedure well. Scope In: Scope Out: Findings:      A scout film of the abdomen was obtained. Surgical clips, consistent       with a previous cholecystectomy, were seen in the area of the right       upper quadrant of the abdomen. There was bilobed diverticulum seen with       indwelling ampullary "bridge"; there was also some food debris in one of       the lobes of the diverticulum, which was probably initially taken for       one of the stones on initial cholangiogram. The  major papilla was       otherwise normal, with normal bile flow. A wire was passed into the       biliary tree. The bile duct was then deeply cannulated over the       guidewire. Contrast was injected. I personally interpreted the bile duct       images. Ductal flow of contrast was adequate. The lower third of the       main bile duct contained one stone about 6mm. An 8 mm biliary       sphincterotomy was made with a traction (standard) sphincterotome using       blended current. The sphincterotomy oozed blood but resolved with time       and after washing site with topical diluted epinephrine. The biliary       tree was swept with a 12 mm balloon starting at the upper third of the       main bile duct. One stone was removed as well as some debris. Multiple       subsequent sweeps, and post-occlusion cholangiography of the duct       revealed no further stones. Pancreatogram was not obtained,       intentionally. Bile flow per ampulla was brisk post-procedure. Impression:               - The major papilla appeared normal.                           - Choledocholithiasis was found. Complete removal  was accomplished by biliary sphincterotomy and                            balloon extraction.                           - A biliary sphincterotomy was performed.                           - The biliary tree was swept. Moderate Sedation:      None Recommendation:           - Avoid aspirin and nonsteroidal anti-inflammatory                            medicines for 3 days.                           - Resume Brilinta at prior dose in 3 days.                           - Watch for pancreatitis, bleeding, perforation,                            and cholangitis.                           - Continue present medications.                           - Return to GI clinic PRN.                           - Return to referring physician as previously                             scheduled. Procedure Code(s):        --- Professional ---                           [phone], Endoscopic retrograde                            cholangiopancreatography (ERCP); with removal of                            calculi/debris from biliary/pancreatic duct(s)                           [id number], Endoscopic retrograde                            cholangiopancreatography (ERCP); with                            sphincterotomy/papillotomy Diagnosis Code(s):        --- Professional ---  K80.50, Calculus of bile duct without cholangitis                            or cholecystitis without obstruction CPT copyright 2017 American Medical Association. All rights reserved. The codes documented in this report are preliminary and upon coder review may  be revised to meet current compliance requirements. Willis Modena, MD 11/11/2017 11:06:44 AM This report has been signed electronically. Number of Addenda: 0

## 2017-11-12 ENCOUNTER — Encounter (HOSPITAL_COMMUNITY): Payer: Self-pay | Admitting: Gastroenterology

## 2017-12-19 ENCOUNTER — Encounter (HOSPITAL_COMMUNITY): Payer: Self-pay

## 2017-12-19 ENCOUNTER — Emergency Department (HOSPITAL_COMMUNITY)
Admission: EM | Admit: 2017-12-19 | Discharge: 2017-12-19 | Disposition: A | Discharge status: Home / Self Care | Payer: Medicare Other | Attending: Emergency Medicine | Admitting: Emergency Medicine

## 2017-12-19 ENCOUNTER — Other Ambulatory Visit: Payer: Self-pay

## 2017-12-19 DIAGNOSIS — Z87891 Personal history of nicotine dependence: Secondary | ICD-10-CM | POA: Insufficient documentation

## 2017-12-19 DIAGNOSIS — Z7982 Long term (current) use of aspirin: Secondary | ICD-10-CM | POA: Diagnosis not present

## 2017-12-19 DIAGNOSIS — I255 Ischemic cardiomyopathy: Secondary | ICD-10-CM | POA: Insufficient documentation

## 2017-12-19 DIAGNOSIS — M545 Low back pain, unspecified: Secondary | ICD-10-CM

## 2017-12-19 DIAGNOSIS — Z79899 Other long term (current) drug therapy: Secondary | ICD-10-CM | POA: Diagnosis not present

## 2017-12-19 DIAGNOSIS — I2510 Atherosclerotic heart disease of native coronary artery without angina pectoris: Secondary | ICD-10-CM | POA: Diagnosis not present

## 2017-12-19 DIAGNOSIS — I252 Old myocardial infarction: Secondary | ICD-10-CM | POA: Insufficient documentation

## 2017-12-19 DIAGNOSIS — I10 Essential (primary) hypertension: Secondary | ICD-10-CM | POA: Insufficient documentation

## 2017-12-19 DIAGNOSIS — E785 Hyperlipidemia, unspecified: Secondary | ICD-10-CM | POA: Diagnosis not present

## 2017-12-19 MED ORDER — METHOCARBAMOL 500 MG PO TABS: 500.0000 mg | ORAL_TABLET | Freq: Three times a day (TID) | ORAL | 0 refills | Status: DC | PRN

## 2017-12-19 MED ORDER — KETOROLAC TROMETHAMINE 30 MG/ML IJ SOLN
30.0000 mg | Freq: Once | INTRAMUSCULAR | Status: AC
  Administered 2017-12-19: 30 mg via INTRAMUSCULAR
  Filled 2017-12-19: qty 1

## 2017-12-19 MED ORDER — HYDROCODONE-ACETAMINOPHEN 5-325 MG PO TABS
1.0000 | ORAL_TABLET | Freq: Once | ORAL | Status: AC
  Administered 2017-12-19: 1 via ORAL
  Filled 2017-12-19: qty 1

## 2017-12-19 MED ORDER — HYDROCODONE-ACETAMINOPHEN 5-325 MG PO TABS: 1.0000 | ORAL_TABLET | Freq: Four times a day (QID) | ORAL | 0 refills | Status: DC | PRN

## 2017-12-19 NOTE — ED Notes (Signed)
UA sent down for holding to lab

## 2017-12-19 NOTE — ED Provider Notes (Signed)
[facility]-EMERGENCY DEPT Provider Note   CSN: [id number] Arrival date & time: 12/19/17  4128     History   Chief Complaint Chief Complaint  Patient presents with  . Back Pain    HPI Carrie Barnes is a 82 y.o. female.  HPI Patient presents with back pain.  Has had for the last few weeks.  Has been seen by Dr. Shon Baton.  Had MRI last week that reportedly showed only spinal stenosis.  Plan was to make physical therapy for primary treatment.  Has been on Tylenol 3 without relief.  Since physical therapy on Thursday with today being Saturday pain has increased.  No numbness or weakness.  She had been on a prednisone pack without relief also finished up a few days ago.  No fevers or chills.  Has had some dysuria but is being treated for urinary tract infection.  She has had 3 pills for that.  No loss of bladder or bowel control.  Pain is dull and in her lower back.  Worse with certain movements.  Worse with straight leg raise. Past Medical History:  Diagnosis Date  . Cholecystitis   . Coronary artery disease    a. 03/2017: 80-90% LAD stenosis (PCI/DES placement with a 2.75x16 mm Promus Premier stent). No significant stenosis along RCA or LCx.   Marland Kitchen DJD (degenerative joint disease)   . Hyperlipidemia   . Hypertension    dx s/p MI   . Ischemic cardiomyopathy   . Myocardial infarction [facility]) 03/17/2017   03-2017 treated at [facility]   . Osteoporosis   . Status post insertion of drug-eluting stent into left anterior descending (LAD) artery 03/2017    Patient Active Problem List   Diagnosis Date Noted  . Chronic cholecystitis due to cholelithiasis with choledocholithiasis 11/05/2017  . Cholelithiasis with chronic cholecystitis 11/01/2017  . Cholelithiasis 05/18/2017  . CAD in native artery 03/31/2017  . Ischemic cardiomyopathy 03/31/2017  . Hyperlipemia 03/31/2017  . DJD (degenerative joint disease) 03/31/2017  . Osteoporosis 03/31/2017     Past Surgical History:  Procedure Laterality Date  . APPENDECTOMY    . CESAREAN SECTION    . CHOLECYSTECTOMY N/A 11/05/2017   Procedure: LAPAROSCOPIC CHOLECYSTECTOMY WITH INTRAOPERATIVE CHOLANGIOGRAM;  Surgeon: Darnell Level, MD;  Location: [facility];  Service: General;  Laterality: N/A;  . ERCP N/A 11/11/2017   Procedure: ENDOSCOPIC RETROGRADE CHOLANGIOPANCREATOGRAPHY (ERCP);  Surgeon: Willis Modena, MD;  Location: [facility];  Service: Endoscopy;  Laterality: N/A;  possible  . EUS N/A 11/11/2017   Procedure: UPPER ENDOSCOPIC ULTRASOUND (EUS) RADIAL;  Surgeon: Willis Modena, MD;  Location: [facility];  Service: Endoscopy;  Laterality: N/A;  . stent  Left 03/2017   anterior descending ; drug eluting ; tx of MI at [facility]   . TONSILLECTOMY    . VAGINAL HYSTERECTOMY    . vaginal sling       OB History   None      Home Medications    Prior to Admission medications   Medication Sig Start Date End Date Taking? Authorizing Provider  acetaminophen (TYLENOL) 500 MG tablet Take 500 mg by mouth daily as needed for moderate pain or headache.    [provider]  alendronate (FOSAMAX) 70 MG tablet Take 70 mg by mouth once a week. Take in the morning with a full glass of water, on an empty stomach, and do not take anything else by mouth or lie down for the next 30 min.  [provider]  aspirin EC 81 MG tablet Take 1 tablet (81 mg total) by mouth daily. 11/14/17 11/09/18  Willis Modena, MD  atorvastatin (LIPITOR) 80 MG tablet Take 1 tablet (80 mg total) by mouth daily. 03/31/17 03/26/18  Dyann Kief, PA-C  calcium carbonate (OSCAL) 1500 (600 Ca) MG TABS tablet Take 600 mg of elemental calcium by mouth 2 (two) times daily with a meal.    [provider]  cetirizine (ZYRTEC) 10 MG tablet Take 10 mg by mouth daily as needed for allergies.     [provider]  Cholecalciferol (VITAMIN D3) 2000 units capsule Take 2,000 Units by mouth  daily.    [provider]  CRANBERRY EXTRACT PO Take 1 capsule by mouth daily.    [provider]  HYDROcodone-acetaminophen (NORCO/VICODIN) 5-325 MG tablet Take 1-2 tablets by mouth every 6 (six) hours as needed. 12/19/17   Benjiman Core, MD  losartan (COZAAR) 25 MG tablet Take 1 tablet (25 mg total) by mouth daily. 03/31/17 03/26/18  Dyann Kief, PA-C  methocarbamol (ROBAXIN) 500 MG tablet Take 1 tablet (500 mg total) by mouth every 8 (eight) hours as needed for muscle spasms. 12/19/17   Benjiman Core, MD  metoprolol succinate (TOPROL-XL) 50 MG 24 hr tablet Take 1 tablet (50 mg total) by mouth daily. 03/31/17 03/26/18  Dyann Kief, PA-C  Multiple Vitamin (MULTI-VITAMINS) TABS Take 1 tablet by mouth daily.    [provider]  nitroGLYCERIN (NITROSTAT) 0.4 MG SL tablet Place 1 tablet (0.4 mg total) under the tongue every 5 (five) minutes as needed for chest pain. 03/31/17   Dyann Kief, PA-C  Omega-3 Fatty Acids (FISH OIL) 1200 MG CAPS Take 1,200 mg by mouth 2 (two) times daily.     [provider]  omeprazole (PRILOSEC) 20 MG capsule Take 20 mg by mouth daily.    [provider]  Polyethyl Glycol-Propyl Glycol (SYSTANE OP) Place 1 drop into both eyes daily as needed (dry eyes).    [provider]  ticagrelor (BRILINTA) 90 MG TABS tablet Take 1 tablet (90 mg total) by mouth 2 (two) times daily. 11/14/17 11/09/18  Willis Modena, MD  traMADol (ULTRAM) 50 MG tablet Take 50 mg by mouth at bedtime as needed for moderate pain.     [provider]  vitamin C (ASCORBIC ACID) 500 MG tablet Take 500 mg by mouth daily.    [provider]    Family History Family History  Problem Relation Age of Onset  . Heart disease Father   . Breast cancer Sister   . Pancreatic cancer Sister     Social History Social History   Tobacco Use  . Smoking status: Former Smoker    Packs/day: 1.00    Years: 30.00    Pack years: 30.00     Types: Cigarettes  . Smokeless tobacco: Never Used  . Tobacco comment: quit 30 years   Substance Use Topics  . Alcohol use: Yes    Comment: Social  . Drug use: Never     Allergies   Patient has no known allergies.   Review of Systems Review of Systems  Constitutional: Negative for appetite change.  HENT: Negative for congestion.   Respiratory: Negative for shortness of breath.   Cardiovascular: Negative for chest pain.  Gastrointestinal: Negative for abdominal pain.  Genitourinary: Positive for dysuria.  Musculoskeletal: Positive for back pain.  Skin: Negative for rash.  Neurological: Negative for speech difficulty and light-headedness.  Psychiatric/Behavioral: Negative for confusion.     Physical Exam Updated Vital Signs BP 102/70 (BP Location: Left Arm)   Pulse 62   Temp 98.7 F (37.1 C) (Oral)   Resp 18   Ht 5\' 2"  (1.575 m)   Wt 68 kg (150 lb)   SpO2 100%   BMI 27.44 kg/m   Physical Exam  Constitutional: She appears well-developed.  HENT:  Head: Atraumatic.  Neck: Neck supple.  Cardiovascular: Normal rate.  Pulmonary/Chest: Effort normal.  Abdominal: There is no tenderness.  Musculoskeletal: She exhibits tenderness.  Patient with paraspinal tenderness in her lower back.  Pain with straight leg lifts bilaterally.  Good flexion extension at the ankle.  Perineal sensation intact.  Neurological: She is alert.  Skin: Skin is warm. Capillary refill takes less than 2 seconds.     ED Treatments / Results  Labs (all labs ordered are listed, but only abnormal results are displayed) Labs Reviewed - No data to display  EKG None  Radiology No results found.  Procedures Procedures (including critical care time)  Medications Ordered in ED Medications  ketorolac (TORADOL) 30 MG/ML injection 30 mg (30 mg Intramuscular Given 12/19/17 1021)  HYDROcodone-acetaminophen (NORCO/VICODIN) 5-325 MG per tablet 1 tablet (1 tablet Oral Given 12/19/17 1021)      Initial Impression / Assessment and Plan / ED Course  I have reviewed the triage vital signs and the nursing notes.  Pertinent labs & imaging results that were available during my care of the patient were reviewed by me and considered in my medical decision making (see chart for details).     Patient with back pain.  Results of MRI reviewed with Dr. Shelle Iron.  Who pulled up MRI results.  No severe disease.  Patient felt better after Toradol and Vicodin.  Will discharge home with muscle relaxers and few Vicodin.  Final Clinical Impressions(s) / ED Diagnoses   Final diagnoses:  Midline low back pain without sciatica, unspecified chronicity    ED Discharge Orders        Ordered    HYDROcodone-acetaminophen (NORCO/VICODIN) 5-325 MG tablet  Every 6 hours PRN     12/19/17 1235    methocarbamol (ROBAXIN) 500 MG tablet  Every 8 hours PRN     12/19/17 1235       Benjiman Core, MD 12/19/17 1552

## 2017-12-19 NOTE — ED Triage Notes (Addendum)
Pt had MRI had 1 week ago, indicating spinal stenosis.  Pt went to PT on Thursday, and then pain has been increasing since then. Pt states pain started with walking, and now she has pain at rest. Tylenol #3 has not helped much. Pt also states that she has a dx of a UTI

## 2018-01-23 ENCOUNTER — Other Ambulatory Visit: Payer: Self-pay | Admitting: Physician Assistant

## 2018-01-25 NOTE — Telephone Encounter (Signed)
Supervision Information   Supervising Provider Type of Supervision  Kathleene Hazel, MD Incident To  Outpatient Medication Detail    Disp Refills Start End   losartan (COZAAR) 25 MG tablet 90 tablet 3 03/31/2017 03/26/2018   Sig - Route: Take 1 tablet (25 mg total) by mouth daily. - Oral   Sent to pharmacy as: losartan (COZAAR) 25 MG tablet   E-Prescribing Status: Receipt confirmed by pharmacy (03/31/2017 11:45 AM EDT)   Pharmacy   [address] SERVICE - [place], [place] - [address]

## 2018-01-25 NOTE — Telephone Encounter (Signed)
Supervision Information   Supervising Provider Type of Supervision  Kathleene Hazel, MD Incident To  Outpatient Medication Detail    Disp Refills Start End   atorvastatin (LIPITOR) 80 MG tablet 90 tablet 3 03/31/2017 03/26/2018   Sig - Route: Take 1 tablet (80 mg total) by mouth daily. - Oral   Sent to pharmacy as: atorvastatin (LIPITOR) 80 MG tablet   E-Prescribing Status: Receipt confirmed by pharmacy (03/31/2017 11:45 AM EDT)   Pharmacy   [address] SERVICE - [place], [place] - [address]

## 2018-03-24 ENCOUNTER — Other Ambulatory Visit: Payer: Self-pay | Admitting: Internal Medicine

## 2018-03-24 DIAGNOSIS — Z1231 Encounter for screening mammogram for malignant neoplasm of breast: Secondary | ICD-10-CM

## 2018-04-22 ENCOUNTER — Ambulatory Visit
Admission: RE | Admit: 2018-04-22 | Discharge: 2018-04-22 | Disposition: A | Discharge status: Home / Self Care | Payer: Medicare Other | Source: Ambulatory Visit | Attending: Internal Medicine | Admitting: Internal Medicine

## 2018-04-22 DIAGNOSIS — Z1231 Encounter for screening mammogram for malignant neoplasm of breast: Secondary | ICD-10-CM

## 2018-05-06 ENCOUNTER — Encounter: Payer: Self-pay | Admitting: Cardiovascular Disease

## 2018-05-10 ENCOUNTER — Ambulatory Visit: Payer: Medicare Other | Admitting: Cardiovascular Disease

## 2018-05-10 ENCOUNTER — Encounter: Payer: Self-pay | Admitting: Cardiovascular Disease

## 2018-05-10 VITALS — BP 126/70 | HR 72 | Ht 62.0 in | Wt 159.8 lb

## 2018-05-10 DIAGNOSIS — I2510 Atherosclerotic heart disease of native coronary artery without angina pectoris: Secondary | ICD-10-CM | POA: Diagnosis not present

## 2018-05-10 DIAGNOSIS — I255 Ischemic cardiomyopathy: Secondary | ICD-10-CM | POA: Diagnosis not present

## 2018-05-10 DIAGNOSIS — E785 Hyperlipidemia, unspecified: Secondary | ICD-10-CM

## 2018-05-10 NOTE — Patient Instructions (Signed)
Medication Instructions:  Your physician has recommended you make the following change in your medication: Stop Brilinta.   Labwork: none  Testing/Procedures: none  Follow-Up: Your physician recommends that you schedule a follow-up appointment in: 12 months. Please call our office in about 8 months to schedule this appointment   Any Other Special Instructions Will Be Listed Below (If Applicable).     If you need a refill on your cardiac medications before your next appointment, please call your pharmacy.

## 2018-05-10 NOTE — Progress Notes (Signed)
Chief Complaint  Patient presents with  . Follow-up    CAD    History of Present Illness: 82 yo female with history of CAD, ischemic cardiomyopathy, HTN, HLD and spinal stenosis here today for cardiac follow up. She was admitted to [facility] in August 2018 with an MI and had a drug eluting stent placed in the LAD. LVEF at the time of her MI was 35-40% but improved to 55-60% on echo here in our office September 2018. She had issues with her gallbladder in October 2018 and her DAPT was held. She was placed on Lovenox. At f/u in our office 05/18/17, she reported feeling well and was back on her ASA and Brilinta. She has since had a cholecystectomy in March 2019.   She is here today for follow up. The patient denies any chest pain, dyspnea, palpitations, lower extremity edema, orthopnea, PND, dizziness, near syncope or syncope.    Primary Care Physician: Renford Dills, MD  Past Medical History:  Diagnosis Date  . Cholecystitis   . Coronary artery disease    a. 03/2017: 80-90% LAD stenosis (PCI/DES placement with a 2.75x16 mm Promus Premier stent). No significant stenosis along RCA or LCx.   Marland Kitchen DJD (degenerative joint disease)   . Hyperlipidemia   . Hypertension    dx s/p MI   . Ischemic cardiomyopathy   . Myocardial infarction [facility]) 03/17/2017   03-2017 treated at [facility]   . Osteoporosis   . Status post insertion of drug-eluting stent into left anterior descending (LAD) artery 03/2017    Past Surgical History:  Procedure Laterality Date  . APPENDECTOMY    . CESAREAN SECTION    . CHOLECYSTECTOMY N/A 11/05/2017   Procedure: LAPAROSCOPIC CHOLECYSTECTOMY WITH INTRAOPERATIVE CHOLANGIOGRAM;  Surgeon: Darnell Level, MD;  Location: [facility];  Service: General;  Laterality: N/A;  . ERCP N/A 11/11/2017   Procedure: ENDOSCOPIC RETROGRADE CHOLANGIOPANCREATOGRAPHY (ERCP);  Surgeon: Willis Modena, MD;  Location: [facility];  Service: Endoscopy;  Laterality: N/A;   possible  . EUS N/A 11/11/2017   Procedure: UPPER ENDOSCOPIC ULTRASOUND (EUS) RADIAL;  Surgeon: Willis Modena, MD;  Location: [facility];  Service: Endoscopy;  Laterality: N/A;  . stent  Left 03/2017   anterior descending ; drug eluting ; tx of MI at [facility]   . TONSILLECTOMY    . VAGINAL HYSTERECTOMY    . vaginal sling      Current Outpatient Medications  Medication Sig Dispense Refill  . acetaminophen (TYLENOL) 500 MG tablet Take 500 mg by mouth daily as needed for moderate pain or headache.    . alendronate (FOSAMAX) 70 MG tablet Take 70 mg by mouth once a week. Take in the morning with a full glass of water, on an empty stomach, and do not take anything else by mouth or lie down for the next 30 min.    Marland Kitchen aspirin EC 81 MG tablet Take 1 tablet (81 mg total) by mouth daily. 30 tablet 11  . atorvastatin (LIPITOR) 80 MG tablet TAKE 1 TABLET BY MOUTH  DAILY 90 tablet 3  . calcium carbonate (OSCAL) 1500 (600 Ca) MG TABS tablet Take 600 mg of elemental calcium by mouth 2 (two) times daily with a meal.    . cetirizine (ZYRTEC) 10 MG tablet Take 10 mg by mouth daily as needed for allergies.     . Cholecalciferol (VITAMIN D3) 2000 units capsule Take 2,000 Units by mouth daily.    Marland Kitchen CRANBERRY  EXTRACT PO Take 1 capsule by mouth daily.    Marland Kitchen HYDROcodone-acetaminophen (NORCO/VICODIN) 5-325 MG tablet Take 1-2 tablets by mouth every 6 (six) hours as needed. 8 tablet 0  . losartan (COZAAR) 25 MG tablet TAKE 1 TABLET BY MOUTH  DAILY 90 tablet 3  . methocarbamol (ROBAXIN) 500 MG tablet Take 1 tablet (500 mg total) by mouth every 8 (eight) hours as needed for muscle spasms. 8 tablet 0  . metoprolol succinate (TOPROL-XL) 50 MG 24 hr tablet TAKE 1 TABLET BY MOUTH  DAILY 90 tablet 3  . Multiple Vitamin (MULTI-VITAMINS) TABS Take 1 tablet by mouth daily.    . nitroGLYCERIN (NITROSTAT) 0.4 MG SL tablet Place 1 tablet (0.4 mg total) under the tongue every 5 (five) minutes as needed for chest  pain. 25 tablet 3  . Omega-3 Fatty Acids (FISH OIL) 1200 MG CAPS Take 1,200 mg by mouth 2 (two) times daily.     Marland Kitchen omeprazole (PRILOSEC) 20 MG capsule Take 20 mg by mouth daily.    Bertram Gala Glycol-Propyl Glycol (SYSTANE OP) Place 1 drop into both eyes daily as needed (dry eyes).    . traMADol (ULTRAM) 50 MG tablet Take 50 mg by mouth at bedtime as needed for moderate pain.     . vitamin C (ASCORBIC ACID) 500 MG tablet Take 500 mg by mouth daily.     No current facility-administered medications for this visit.    Facility-Administered Medications Ordered in Other Visits  Medication Dose Route Frequency Provider Last Rate Last Dose  . indomethacin (INDOCIN) 50 MG suppository 50 mg  50 mg Rectal Once Willis Modena, MD        No Known Allergies  Social History   Socioeconomic History  . Marital status: Widowed    Spouse name: Not on file  . Number of children: 4  . Years of education: Not on file  . Highest education level: Not on file  Occupational History  . Occupation: Futures trader  . Occupation: Administrator  . Financial resource strain: Not on file  . Food insecurity:    Worry: Not on file    Inability: Not on file  . Transportation needs:    Medical: Not on file    Non-medical: Not on file  Tobacco Use  . Smoking status: Former Smoker    Packs/day: 1.00    Years: 30.00    Pack years: 30.00    Types: Cigarettes  . Smokeless tobacco: Never Used  . Tobacco comment: quit 30 years   Substance and Sexual Activity  . Alcohol use: Yes    Comment: Social  . Drug use: Never  . Sexual activity: Not on file  Lifestyle  . Physical activity:    Days per week: Not on file    Minutes per session: Not on file  . Stress: Not on file  Relationships  . Social connections:    Talks on phone: Not on file    Gets together: Not on file    Attends religious service: Not on file    Active member of club or organization: Not on file    Attends  meetings of clubs or organizations: Not on file    Relationship status: Not on file  . Intimate partner violence:    Fear of current or ex partner: Not on file    Emotionally abused: Not on file    Physically abused: Not on file    Forced sexual activity: Not on  file  Other Topics Concern  . Not on file  Social History Narrative  . Not on file    Family History  Problem Relation Age of Onset  . Heart disease Father   . Breast cancer Sister   . Pancreatic cancer Sister     Review of Systems:  As stated in the HPI and otherwise negative.   BP 126/70   Pulse 72   Ht 5\' 2"  (1.575 m)   Wt 159 lb 12.8 oz (72.5 kg)   SpO2 95%   BMI 29.23 kg/m   Physical Examination:  General: Well developed, well nourished, NAD  HEENT: OP clear, mucus membranes moist  SKIN: warm, dry. No rashes. Neuro: No focal deficits  Musculoskeletal: Muscle strength 5/5 all ext  Psychiatric: Mood and affect normal  Neck: No JVD, no carotid bruits, no thyromegaly, no lymphadenopathy.  Lungs:Clear bilaterally, no wheezes, rhonci, crackles Cardiovascular: Regular rate and rhythm. No murmurs, gallops or rubs. Abdomen:Soft. Bowel sounds present. Non-tender.  Extremities: No lower extremity edema. Pulses are 2 + in the bilateral DP/PT.  EKG:  EKG is not  ordered today. The ekg ordered today demonstrates   Recent Labs: 11/06/2017: ALT 64; BUN 15; Creatinine, Ser 0.77; Hemoglobin 10.1; Platelets 155; Potassium 4.5; Sodium 138   Lipid Panel    Component Value Date/Time   CHOL 120 07/09/2017 0850   TRIG 207 (H) 07/09/2017 0850   HDL 42 07/09/2017 0850   CHOLHDL 2.9 07/09/2017 0850   LDLCALC 37 07/09/2017 0850     Wt Readings from Last 3 Encounters:  05/10/18 159 lb 12.8 oz (72.5 kg)  12/19/17 150 lb (68 kg)  11/11/17 150 lb (68 kg)     Other studies Reviewed: Additional studies/ records that were reviewed today include: . Review of the above records demonstrates:    Assessment and Plan:   1.  CAD without angina: She had a drug eluting stent placed in the LAD in August 2018. She is having no chest pain. Will continue ASA, statin and beta blocker. She can stop her Brilinta today.   2. Ischemic cardiomyopathy: Her LV systolic function normalized on echo in September 2018.    3. Hyperlipidemia: Her lipids have been well controlled. LDL 50 in July 2019. Continue statin.    Current medicines are reviewed at length with the patient today.  The patient does not have concerns regarding medicines.  The following changes have been made:  no change  Labs/ tests ordered today include:   No orders of the defined types were placed in this encounter.    Disposition:   FU with me in 12 months   Signed, Verne Carrow, MD 05/10/2018 9:49 AM    [facility] [address] [place], [place], [place]  [address] Phone: [phone]; Fax: [phone]

## 2018-05-26 ENCOUNTER — Ambulatory Visit: Payer: Medicare Other | Admitting: Cardiovascular Disease

## 2018-06-11 ENCOUNTER — Encounter

## 2018-07-20 ENCOUNTER — Telehealth: Payer: Self-pay | Admitting: Cardiovascular Disease

## 2018-07-20 MED ORDER — LOSARTAN POTASSIUM 50 MG PO TABS: 50.0000 mg | ORAL_TABLET | Freq: Every day | ORAL | 2 refills | Status: DC

## 2018-07-20 NOTE — Telephone Encounter (Signed)
She can increase her Losartan to 50 mg daily and follow BP over the next week. Her BP is probably elevated due to pain if she has been having more pain lately. Thanks, chris

## 2018-07-20 NOTE — Telephone Encounter (Signed)
Spoke with the pt and informed her that per Dr Clifton James, we will increase her losartan to 50 mg po daily, and she should follow her BP over the next week.  Informed her that per Dr Clifton James, her elevated BP could be due to her pain.  Confirmed the pharmacy of choice with the pt.  Pt verbalized understanding and agrees with this plan.

## 2018-07-20 NOTE — Telephone Encounter (Signed)
Pt calling to report to Dr Clifton James and Dennie Bible RN that she has been tracking her pressures regularly, and the last 5 readings have been high.  Pt states she takes her pressures in the morning, 1 hour after administration of her losartan.  Then she logs this information. Pt states she takes her Toprol XL at bedtime every night. Pt is concerned for the increase in her systolic numbers over the last 5 days.  Pt states she is completely ASYMPTOMATIC, has no cardiac or neurological issues.  Pt is concerned mostly for she will be going out of town to [place], starting tomorrow, and will be gone for the remainder of the week.  Pt would like for Dr Clifton James to advise on what she should do, or should her meds be adjusted. Pt did want to add that she is dealing with a great deal of back and sciatic pain, so that may be causing her pressure to be elevated as well. Pt states that other than that, she has not changed her diet, and she is compliant with taking all her meds,  Informed the pt that Dr Clifton James is out of the office at this time, but I will still route this message for him to review and advise on.  Informed the pt that a triage nurse will follow-up with her thereafter, once recommendations received.  Pt verbalized understanding and agrees with this plan.

## 2018-07-20 NOTE — Telephone Encounter (Signed)
  Pt c/o BP issue: STAT if pt c/o blurred vision, one-sided weakness or slurred speech  1. What are your last 5 BP readings? 173/88, 165/86, 174/84, 161/83  2. Are you having any other symptoms (ex. Dizziness, headache, blurred vision, passed out)? no  3. What is your BP issue? Patient is concerned that her BP is running too high and she will be going out of town tomorrow and wanted to make sure it was ok before she left

## 2018-07-21 MED ORDER — LOSARTAN POTASSIUM 25 MG PO TABS: 50.0000 mg | ORAL_TABLET | Freq: Every day | ORAL | 3 refills | Status: DC

## 2018-08-06 ENCOUNTER — Telehealth: Payer: Self-pay | Admitting: Cardiovascular Disease

## 2018-08-06 NOTE — Telephone Encounter (Signed)
New Message   Pt c/o BP issue:  1. What are your last 5 BP readings? 189/105, 189/97, 181/102, 160/82 (after taking an extra metoprolol) 2. Are you having any other symptoms (ex. Dizziness, headache, blurred vision, passed out)? nausa 3. What is your medication issue?    Patient states that last night she spoke with her daughter who is a nurse who advised her take an extra metoprolol. When she did it brought her BP down to 160/82. Please call to discuss.

## 2018-08-06 NOTE — Telephone Encounter (Signed)
Spoke with pt. She states that her BP this morning is 130/73 which is normal for her. Pt thinks that her BP may has been high because she has been eating salty food yesterday and during  christmas day. Pt also states that her ankles are a little swollen. Pt is aware that it could be the increased salt intake Pt will watch her salt intake from now on. Pt is aware to call back if needed. Pt verbalized understanding.

## 2018-08-09 ENCOUNTER — Telehealth: Payer: Self-pay | Admitting: Cardiovascular Disease

## 2018-08-09 NOTE — Telephone Encounter (Signed)
New Message    Patient c/o Palpitations:  High priority if patient c/o lightheadedness, shortness of breath, or chest pain  1) How long have you had palpitations/irregular HR/ Afib? Are you having the symptoms now? Patient had an irregular heartbeat this morning for the first time   2) Are you currently experiencing lightheadedness, SOB or CP? No  3) Do you have a history of afib (atrial fibrillation) or irregular heart rhythm? No   4) Have you checked your BP or HR? (document readings if available): BP-177/103, 184/115 both from last night and this morning BP was 146/69.... HR-69 this morning  5) Are you experiencing any other symptoms? NO

## 2018-08-09 NOTE — Telephone Encounter (Signed)
I spoke with pt. BP was elevated last night as noted below.  This AM BP was 146/69 and heart rate was 69.  Her monitor showed this AM that she had an irregular heart rate.  Pt is not having any palpitations and does not feel irregular rhythm. States BP has been elevated in the evening for the last 3-4 nights.  She has been taking Toprol in the evenings recently and checking BP before taking Toprol.  I asked her to check BP about 2 hours after taking Toprol and call us back in about a week with readings.  I told pt she could call us back before then or call on call provider if she was concerned about readings. I asked her to let us know if machine continues to show irregular heartbeat reading.

## 2018-08-23 NOTE — Telephone Encounter (Signed)
I spoke with pt. She reports BP is doing OK.  She will call us if it becomes elevated again.

## 2018-11-01 ENCOUNTER — Other Ambulatory Visit: Payer: Self-pay | Admitting: Physician Assistant

## 2018-11-01 MED ORDER — LOSARTAN POTASSIUM 25 MG PO TABS: 50.0000 mg | ORAL_TABLET | Freq: Every day | ORAL | 3 refills | Status: DC

## 2018-11-01 NOTE — Telephone Encounter (Signed)
Spoke to patient to clarify Rx need before sending. Sent updated Rx to [facility] for Losartan 25 mg tablets, take 2 tablets daily, 180 tablets sent in for refills. Patient appreciates the help with this.

## 2019-01-14 DIAGNOSIS — R002 Palpitations: Secondary | ICD-10-CM

## 2019-01-14 DIAGNOSIS — I2510 Atherosclerotic heart disease of native coronary artery without angina pectoris: Secondary | ICD-10-CM

## 2019-01-14 DIAGNOSIS — I255 Ischemic cardiomyopathy: Secondary | ICD-10-CM

## 2019-01-18 ENCOUNTER — Telehealth: Payer: Self-pay | Admitting: *Deleted

## 2019-01-18 NOTE — Telephone Encounter (Signed)
[employer] to ship a 14 day cardiac event monitor to her home.  Instructions reviewed briefly as they are included in the monitor kit. 

## 2019-01-24 ENCOUNTER — Ambulatory Visit (INDEPENDENT_AMBULATORY_CARE_PROVIDER_SITE_OTHER): Payer: Medicare Other

## 2019-01-24 DIAGNOSIS — R002 Palpitations: Secondary | ICD-10-CM

## 2019-01-24 DIAGNOSIS — I255 Ischemic cardiomyopathy: Secondary | ICD-10-CM

## 2019-03-01 ENCOUNTER — Other Ambulatory Visit: Payer: Self-pay | Admitting: Cardiovascular Disease

## 2019-03-07 ENCOUNTER — Other Ambulatory Visit: Payer: Self-pay | Admitting: Cardiovascular Disease

## 2019-03-07 DIAGNOSIS — R002 Palpitations: Secondary | ICD-10-CM

## 2019-03-07 DIAGNOSIS — I255 Ischemic cardiomyopathy: Secondary | ICD-10-CM

## 2019-04-05 ENCOUNTER — Other Ambulatory Visit: Payer: Self-pay | Admitting: Internal Medicine

## 2019-04-05 DIAGNOSIS — Z1231 Encounter for screening mammogram for malignant neoplasm of breast: Secondary | ICD-10-CM

## 2019-05-19 ENCOUNTER — Ambulatory Visit
Admission: RE | Admit: 2019-05-19 | Discharge: 2019-05-19 | Disposition: A | Discharge status: Home / Self Care | Payer: Medicare Other | Source: Ambulatory Visit | Attending: Internal Medicine | Admitting: Internal Medicine

## 2019-05-19 ENCOUNTER — Other Ambulatory Visit: Payer: Self-pay

## 2019-05-19 DIAGNOSIS — Z1231 Encounter for screening mammogram for malignant neoplasm of breast: Secondary | ICD-10-CM

## 2019-05-26 ENCOUNTER — Other Ambulatory Visit: Payer: Self-pay | Admitting: Cardiovascular Disease

## 2019-06-19 NOTE — Progress Notes (Signed)
Chief Complaint  Patient presents with  . Follow-up    CAD    History of Present Illness: 83 yo female with history of CAD, ischemic cardiomyopathy, HTN, HLD and spinal stenosis here today for cardiac follow up. She was admitted to [facility] in August 2018 with an MI and had a drug eluting stent placed in the LAD. LVEF at the time of her MI was 35-40% but improved to 55-60% on echo here in our office September 2018. Cardiac monitor July 2020 with sinus and PACs.   She is here today for follow up. The patient denies any chest pain, dyspnea, palpitations, lower extremity edema, orthopnea, PND, dizziness, near syncope or syncope.    Primary Care Physician: Renford Dills, MD  Past Medical History:  Diagnosis Date  . Cholecystitis   . Coronary artery disease    a. 03/2017: 80-90% LAD stenosis (PCI/DES placement with a 2.75x16 mm Promus Premier stent). No significant stenosis along RCA or LCx.   Marland Kitchen DJD (degenerative joint disease)   . Hyperlipidemia   . Hypertension    dx s/p MI   . Ischemic cardiomyopathy   . Myocardial infarction [facility]) 03/17/2017   03-2017 treated at [facility]   . Osteoporosis   . Status post insertion of drug-eluting stent into left anterior descending (LAD) artery 03/2017    Past Surgical History:  Procedure Laterality Date  . APPENDECTOMY    . CESAREAN SECTION    . CHOLECYSTECTOMY N/A 11/05/2017   Procedure: LAPAROSCOPIC CHOLECYSTECTOMY WITH INTRAOPERATIVE CHOLANGIOGRAM;  Surgeon: Darnell Level, MD;  Location: [facility];  Service: General;  Laterality: N/A;  . ERCP N/A 11/11/2017   Procedure: ENDOSCOPIC RETROGRADE CHOLANGIOPANCREATOGRAPHY (ERCP);  Surgeon: Willis Modena, MD;  Location: [facility];  Service: Endoscopy;  Laterality: N/A;  possible  . EUS N/A 11/11/2017   Procedure: UPPER ENDOSCOPIC ULTRASOUND (EUS) RADIAL;  Surgeon: Willis Modena, MD;  Location: [facility];  Service: Endoscopy;  Laterality: N/A;  . stent  Left  03/2017   anterior descending ; drug eluting ; tx of MI at [facility]   . TONSILLECTOMY    . VAGINAL HYSTERECTOMY    . vaginal sling      Current Outpatient Medications  Medication Sig Dispense Refill  . acetaminophen (TYLENOL) 500 MG tablet Take 500 mg by mouth daily as needed for moderate pain or headache.    . alendronate (FOSAMAX) 70 MG tablet Take 70 mg by mouth once a week. Take in the morning with a full glass of water, on an empty stomach, and do not take anything else by mouth or lie down for the next 30 min.    Marland Kitchen aspirin EC 81 MG tablet Take 81 mg by mouth daily.    Marland Kitchen atorvastatin (LIPITOR) 80 MG tablet TAKE 1 TABLET BY MOUTH  DAILY 90 tablet 0  . calcium carbonate (OSCAL) 1500 (600 Ca) MG TABS tablet Take 600 mg of elemental calcium by mouth 2 (two) times daily with a meal.    . cetirizine (ZYRTEC) 10 MG tablet Take 10 mg by mouth daily as needed for allergies.     . Cholecalciferol (VITAMIN D3) 2000 units capsule Take 2,000 Units by mouth daily.    Marland Kitchen CRANBERRY EXTRACT PO Take 1 capsule by mouth daily.    Marland Kitchen losartan (COZAAR) 25 MG tablet Take 2 tablets (50 mg total) by mouth daily. 180 tablet 3  . metoprolol succinate (TOPROL-XL) 50 MG 24 hr tablet TAKE 1 TABLET  BY MOUTH  DAILY 90 tablet 0  . Multiple Vitamin (MULTI-VITAMINS) TABS Take 1 tablet by mouth daily.    . nitroGLYCERIN (NITROSTAT) 0.4 MG SL tablet Place 1 tablet (0.4 mg total) under the tongue every 5 (five) minutes as needed for chest pain. 25 tablet 3  . Omega-3 Fatty Acids (FISH OIL) 1200 MG CAPS Take 1,200 mg by mouth 2 (two) times daily.     Marland Kitchen omeprazole (PRILOSEC) 20 MG capsule Take 20 mg by mouth daily.    Bertram Gala Glycol-Propyl Glycol (SYSTANE OP) Place 1 drop into both eyes daily as needed (dry eyes).    . vitamin C (ASCORBIC ACID) 500 MG tablet Take 500 mg by mouth daily.     No current facility-administered medications for this visit.    Facility-Administered Medications Ordered in Other  Visits  Medication Dose Route Frequency Provider Last Rate Last Dose  . indomethacin (INDOCIN) 50 MG suppository 50 mg  50 mg Rectal Once Willis Modena, MD        No Known Allergies  Social History   Socioeconomic History  . Marital status: Widowed    Spouse name: Not on file  . Number of children: 4  . Years of education: Not on file  . Highest education level: Not on file  Occupational History  . Occupation: Futures trader  . Occupation: Administrator  . Financial resource strain: Not on file  . Food insecurity    Worry: Not on file    Inability: Not on file  . Transportation needs    Medical: Not on file    Non-medical: Not on file  Tobacco Use  . Smoking status: Former Smoker    Packs/day: 1.00    Years: 30.00    Pack years: 30.00    Types: Cigarettes  . Smokeless tobacco: Never Used  . Tobacco comment: quit 30 years   Substance and Sexual Activity  . Alcohol use: Yes    Comment: Social  . Drug use: Never  . Sexual activity: Not on file  Lifestyle  . Physical activity    Days per week: Not on file    Minutes per session: Not on file  . Stress: Not on file  Relationships  . Social Musician on phone: Not on file    Gets together: Not on file    Attends religious service: Not on file    Active member of club or organization: Not on file    Attends meetings of clubs or organizations: Not on file    Relationship status: Not on file  . Intimate partner violence    Fear of current or ex partner: Not on file    Emotionally abused: Not on file    Physically abused: Not on file    Forced sexual activity: Not on file  Other Topics Concern  . Not on file  Social History Narrative  . Not on file    Family History  Problem Relation Age of Onset  . Heart disease Father   . Breast cancer Sister   . Pancreatic cancer Sister     Review of Systems:  As stated in the HPI and otherwise negative.   BP 124/70   Pulse 82    Ht 5\' 2"  (1.575 m)   Wt 168 lb (76.2 kg)   SpO2 96%   BMI 30.73 kg/m   Physical Examination:  General: Well developed, well nourished, NAD  HEENT: OP clear, mucus  membranes moist  SKIN: warm, dry. No rashes. Neuro: No focal deficits  Musculoskeletal: Muscle strength 5/5 all ext  Psychiatric: Mood and affect normal  Neck: No JVD, no carotid bruits, no thyromegaly, no lymphadenopathy.  Lungs:Clear bilaterally, no wheezes, rhonci, crackles Cardiovascular: Regular rate and rhythm. No murmurs, gallops or rubs. Abdomen:Soft. Bowel sounds present. Non-tender.  Extremities: No lower extremity edema. Pulses are 2 + in the bilateral DP/PT.  EKG:  EKG is ordered today. The ekg ordered today demonstrates NSR, rate 82 bpm  Recent Labs: No results found for requested labs within last 8760 hours.   Lipid Panel    Component Value Date/Time   CHOL 120 07/09/2017 0850   TRIG 207 (H) 07/09/2017 0850   HDL 42 07/09/2017 0850   CHOLHDL 2.9 07/09/2017 0850   LDLCALC 37 07/09/2017 0850     Wt Readings from Last 3 Encounters:  06/20/19 168 lb (76.2 kg)  05/10/18 159 lb 12.8 oz (72.5 kg)  12/19/17 150 lb (68 kg)     Other studies Reviewed: Additional studies/ records that were reviewed today include: . Review of the above records demonstrates:    Assessment and Plan:   1. CAD without angina: She had a drug eluting stent placed in the LAD in August 2018. No chest pain. Continue ASA, beta blocker and statin.    2. Ischemic cardiomyopathy: Her LV systolic function normalized on echo in September 2018.    3. Hyperlipidemia: Lipids followed in primary care. LDL 08 April 2019.  Continue statin.     Current medicines are reviewed at length with the patient today.  The patient does not have concerns regarding medicines.  The following changes have been made:  no change  Labs/ tests ordered today include:   Orders Placed This Encounter  Procedures  . EKG 12-Lead     Disposition:    FU with me in 12 months   Signed, Lauree Chandler, MD 06/20/2019 10:43 AM    [facility] [place], [place], [place]  [address] Phone: [phone]; Fax: [phone]

## 2019-06-20 ENCOUNTER — Ambulatory Visit: Payer: Medicare Other | Admitting: Cardiovascular Disease

## 2019-06-20 ENCOUNTER — Encounter: Payer: Self-pay | Admitting: Cardiovascular Disease

## 2019-06-20 ENCOUNTER — Other Ambulatory Visit: Payer: Self-pay

## 2019-06-20 VITALS — BP 124/70 | HR 82 | Ht 62.0 in | Wt 168.0 lb

## 2019-06-20 DIAGNOSIS — E785 Hyperlipidemia, unspecified: Secondary | ICD-10-CM

## 2019-06-20 DIAGNOSIS — I2510 Atherosclerotic heart disease of native coronary artery without angina pectoris: Secondary | ICD-10-CM | POA: Diagnosis not present

## 2019-06-20 DIAGNOSIS — I255 Ischemic cardiomyopathy: Secondary | ICD-10-CM | POA: Diagnosis not present

## 2019-06-20 NOTE — Patient Instructions (Signed)

## 2019-07-11 ENCOUNTER — Other Ambulatory Visit: Payer: Self-pay

## 2019-07-11 ENCOUNTER — Emergency Department (HOSPITAL_COMMUNITY): Payer: Medicare Other

## 2019-07-11 ENCOUNTER — Inpatient Hospital Stay (HOSPITAL_COMMUNITY)
Admission: EM | Admit: 2019-07-11 | Discharge: 2019-07-18 | DRG: 480 | Disposition: A | Discharge status: Home Health | Payer: Medicare Other | Attending: Internal Medicine | Admitting: Internal Medicine

## 2019-07-11 ENCOUNTER — Encounter (HOSPITAL_COMMUNITY): Payer: Self-pay | Admitting: Emergency Medicine

## 2019-07-11 DIAGNOSIS — R300 Dysuria: Secondary | ICD-10-CM | POA: Diagnosis not present

## 2019-07-11 DIAGNOSIS — S72141A Displaced intertrochanteric fracture of right femur, initial encounter for closed fracture: Secondary | ICD-10-CM | POA: Diagnosis present

## 2019-07-11 DIAGNOSIS — I110 Hypertensive heart disease with heart failure: Secondary | ICD-10-CM | POA: Diagnosis present

## 2019-07-11 DIAGNOSIS — M79601 Pain in right arm: Secondary | ICD-10-CM | POA: Diagnosis present

## 2019-07-11 DIAGNOSIS — Z87440 Personal history of urinary (tract) infections: Secondary | ICD-10-CM

## 2019-07-11 DIAGNOSIS — Z20828 Contact with and (suspected) exposure to other viral communicable diseases: Secondary | ICD-10-CM | POA: Diagnosis present

## 2019-07-11 DIAGNOSIS — K219 Gastro-esophageal reflux disease without esophagitis: Secondary | ICD-10-CM | POA: Diagnosis present

## 2019-07-11 DIAGNOSIS — Z419 Encounter for procedure for purposes other than remedying health state, unspecified: Secondary | ICD-10-CM

## 2019-07-11 DIAGNOSIS — S72144A Nondisplaced intertrochanteric fracture of right femur, initial encounter for closed fracture: Secondary | ICD-10-CM | POA: Diagnosis not present

## 2019-07-11 DIAGNOSIS — Z7982 Long term (current) use of aspirin: Secondary | ICD-10-CM

## 2019-07-11 DIAGNOSIS — S2242XA Multiple fractures of ribs, left side, initial encounter for closed fracture: Secondary | ICD-10-CM | POA: Diagnosis present

## 2019-07-11 DIAGNOSIS — W19XXXA Unspecified fall, initial encounter: Secondary | ICD-10-CM

## 2019-07-11 DIAGNOSIS — E785 Hyperlipidemia, unspecified: Secondary | ICD-10-CM | POA: Diagnosis present

## 2019-07-11 DIAGNOSIS — J9601 Acute respiratory failure with hypoxia: Secondary | ICD-10-CM | POA: Diagnosis not present

## 2019-07-11 DIAGNOSIS — I5042 Chronic combined systolic (congestive) and diastolic (congestive) heart failure: Secondary | ICD-10-CM | POA: Diagnosis present

## 2019-07-11 DIAGNOSIS — S0081XA Abrasion of other part of head, initial encounter: Secondary | ICD-10-CM | POA: Diagnosis present

## 2019-07-11 DIAGNOSIS — Z79899 Other long term (current) drug therapy: Secondary | ICD-10-CM | POA: Diagnosis not present

## 2019-07-11 DIAGNOSIS — Y92031 Bathroom in apartment as the place of occurrence of the external cause: Secondary | ICD-10-CM

## 2019-07-11 DIAGNOSIS — Z8249 Family history of ischemic heart disease and other diseases of the circulatory system: Secondary | ICD-10-CM

## 2019-07-11 DIAGNOSIS — I255 Ischemic cardiomyopathy: Secondary | ICD-10-CM | POA: Diagnosis present

## 2019-07-11 DIAGNOSIS — S72001A Fracture of unspecified part of neck of right femur, initial encounter for closed fracture: Secondary | ICD-10-CM | POA: Diagnosis present

## 2019-07-11 DIAGNOSIS — I361 Nonrheumatic tricuspid (valve) insufficiency: Secondary | ICD-10-CM | POA: Diagnosis not present

## 2019-07-11 DIAGNOSIS — W0110XA Fall on same level from slipping, tripping and stumbling with subsequent striking against unspecified object, initial encounter: Secondary | ICD-10-CM | POA: Diagnosis present

## 2019-07-11 DIAGNOSIS — Y9389 Activity, other specified: Secondary | ICD-10-CM | POA: Diagnosis not present

## 2019-07-11 DIAGNOSIS — J9811 Atelectasis: Secondary | ICD-10-CM | POA: Diagnosis not present

## 2019-07-11 DIAGNOSIS — Z955 Presence of coronary angioplasty implant and graft: Secondary | ICD-10-CM

## 2019-07-11 DIAGNOSIS — M810 Age-related osteoporosis without current pathological fracture: Secondary | ICD-10-CM | POA: Diagnosis present

## 2019-07-11 DIAGNOSIS — Z6830 Body mass index (BMI) 30.0-30.9, adult: Secondary | ICD-10-CM

## 2019-07-11 DIAGNOSIS — E669 Obesity, unspecified: Secondary | ICD-10-CM | POA: Diagnosis present

## 2019-07-11 DIAGNOSIS — S72123A Displaced fracture of lesser trochanter of unspecified femur, initial encounter for closed fracture: Secondary | ICD-10-CM | POA: Diagnosis present

## 2019-07-11 DIAGNOSIS — Y839 Surgical procedure, unspecified as the cause of abnormal reaction of the patient, or of later complication, without mention of misadventure at the time of the procedure: Secondary | ICD-10-CM | POA: Diagnosis not present

## 2019-07-11 DIAGNOSIS — Z87891 Personal history of nicotine dependence: Secondary | ICD-10-CM

## 2019-07-11 DIAGNOSIS — Z803 Family history of malignant neoplasm of breast: Secondary | ICD-10-CM

## 2019-07-11 DIAGNOSIS — I2510 Atherosclerotic heart disease of native coronary artery without angina pectoris: Secondary | ICD-10-CM | POA: Diagnosis present

## 2019-07-11 DIAGNOSIS — I252 Old myocardial infarction: Secondary | ICD-10-CM | POA: Diagnosis not present

## 2019-07-11 DIAGNOSIS — D62 Acute posthemorrhagic anemia: Secondary | ICD-10-CM | POA: Diagnosis not present

## 2019-07-11 DIAGNOSIS — Z7983 Long term (current) use of bisphosphonates: Secondary | ICD-10-CM | POA: Diagnosis not present

## 2019-07-11 DIAGNOSIS — Z01810 Encounter for preprocedural cardiovascular examination: Secondary | ICD-10-CM | POA: Diagnosis not present

## 2019-07-11 LAB — CBC WITH DIFFERENTIAL/PLATELET
Abs Immature Granulocytes: 0.05 10*3/uL (ref 0.00–0.07)
Basophils Absolute: 0 10*3/uL (ref 0.0–0.1)
Basophils Relative: 0 %
Eosinophils Absolute: 0 10*3/uL (ref 0.0–0.5)
Eosinophils Relative: 0 %
HCT: 37.7 % (ref 36.0–46.0)
Hemoglobin: 12.2 g/dL (ref 12.0–15.0)
Immature Granulocytes: 0 %
Lymphocytes Relative: 11 %
Lymphs Abs: 1.3 10*3/uL (ref 0.7–4.0)
MCH: 30.7 pg (ref 26.0–34.0)
MCHC: 32.4 g/dL (ref 30.0–36.0)
MCV: 95 fL (ref 80.0–100.0)
Monocytes Absolute: 0.8 10*3/uL (ref 0.1–1.0)
Monocytes Relative: 7 %
Neutro Abs: 10.3 10*3/uL — ABNORMAL HIGH (ref 1.7–7.7)
Neutrophils Relative %: 82 %
Platelets: 102 10*3/uL — ABNORMAL LOW (ref 150–400)
RBC: 3.97 MIL/uL (ref 3.87–5.11)
RDW: 11.7 % (ref 11.5–15.5)
WBC: 12.6 10*3/uL — ABNORMAL HIGH (ref 4.0–10.5)
nRBC: 0 % (ref 0.0–0.2)

## 2019-07-11 LAB — BASIC METABOLIC PANEL
Anion gap: 11 (ref 5–15)
BUN: 15 mg/dL (ref 8–23)
CO2: 24 mmol/L (ref 22–32)
Calcium: 9.2 mg/dL (ref 8.9–10.3)
Chloride: 104 mmol/L (ref 98–111)
Creatinine, Ser: 0.89 mg/dL (ref 0.44–1.00)
GFR calc Af Amer: >60 mL/min (ref >60)
GFR calc non Af Amer: >60 mL/min (ref >60)
Glucose, Bld: 140 mg/dL — ABNORMAL HIGH (ref 70–99)
Potassium: 4.3 mmol/L (ref 3.5–5.1)
Sodium: 139 mmol/L (ref 135–145)

## 2019-07-11 LAB — PROTIME-INR
INR: 1 (ref 0.8–1.2)
Prothrombin Time: 13.2 seconds (ref 11.4–15.2)

## 2019-07-11 LAB — ABO/RH: ABO/RH(D): A POS

## 2019-07-11 LAB — TYPE AND SCREEN
ABO/RH(D): A POS
Antibody Screen: NEGATIVE

## 2019-07-11 MED ORDER — ONDANSETRON HCL 4 MG/2ML IJ SOLN
4.0000 mg | Freq: Once | INTRAMUSCULAR | Status: AC
  Administered 2019-07-11: 4 mg via INTRAVENOUS
  Filled 2019-07-11: qty 2

## 2019-07-11 MED ORDER — METHOCARBAMOL 1000 MG/10ML IJ SOLN
500.0000 mg | Freq: Four times a day (QID) | INTRAVENOUS | Status: DC | PRN
  Filled 2019-07-11: qty 5

## 2019-07-11 MED ORDER — HYDROCODONE-ACETAMINOPHEN 5-325 MG PO TABS
1.0000 | ORAL_TABLET | Freq: Four times a day (QID) | ORAL | Status: DC | PRN
  Administered 2019-07-11 – 2019-07-13 (×5): 2 via ORAL
  Administered 2019-07-14 (×2): 1 via ORAL
  Administered 2019-07-14 – 2019-07-15 (×2): 2 via ORAL
  Administered 2019-07-15 – 2019-07-16 (×4): 1 via ORAL
  Administered 2019-07-17 (×2): 2 via ORAL
  Administered 2019-07-17: 1 via ORAL
  Administered 2019-07-18 (×2): 2 via ORAL
  Filled 2019-07-11 (×3): qty 2
  Filled 2019-07-11: qty 1
  Filled 2019-07-11: qty 2
  Filled 2019-07-11: qty 1
  Filled 2019-07-11 (×2): qty 2
  Filled 2019-07-11: qty 1
  Filled 2019-07-11 (×3): qty 2
  Filled 2019-07-11 (×2): qty 1
  Filled 2019-07-11 (×3): qty 2
  Filled 2019-07-11: qty 1

## 2019-07-11 MED ORDER — LORAZEPAM 2 MG/ML IJ SOLN
1.0000 mg | Freq: Once | INTRAMUSCULAR | Status: AC
  Administered 2019-07-11: 1 mg via INTRAVENOUS
  Filled 2019-07-11: qty 1

## 2019-07-11 MED ORDER — SODIUM CHLORIDE 0.9 % IV SOLN
INTRAVENOUS | Status: DC
  Administered 2019-07-11 – 2019-07-13 (×5): via INTRAVENOUS

## 2019-07-11 MED ORDER — METHOCARBAMOL 500 MG PO TABS
500.0000 mg | ORAL_TABLET | Freq: Four times a day (QID) | ORAL | Status: DC | PRN
  Administered 2019-07-12 – 2019-07-17 (×7): 500 mg via ORAL
  Filled 2019-07-11 (×8): qty 1

## 2019-07-11 MED ORDER — LORATADINE 10 MG PO TABS
10.0000 mg | ORAL_TABLET | Freq: Every day | ORAL | Status: DC
  Administered 2019-07-12 – 2019-07-18 (×6): 10 mg via ORAL
  Filled 2019-07-11 (×6): qty 1

## 2019-07-11 MED ORDER — MORPHINE SULFATE (PF) 2 MG/ML IV SOLN
0.5000 mg | INTRAVENOUS | Status: DC | PRN
  Administered 2019-07-13 – 2019-07-15 (×5): 0.5 mg via INTRAVENOUS
  Filled 2019-07-11 (×5): qty 1

## 2019-07-11 MED ORDER — ATORVASTATIN CALCIUM 40 MG PO TABS
80.0000 mg | ORAL_TABLET | Freq: Every day | ORAL | Status: DC
  Administered 2019-07-12 – 2019-07-18 (×7): 80 mg via ORAL
  Filled 2019-07-11 (×6): qty 2
  Filled 2019-07-11: qty 4

## 2019-07-11 MED ORDER — SENNOSIDES-DOCUSATE SODIUM 8.6-50 MG PO TABS
1.0000 | ORAL_TABLET | Freq: Every evening | ORAL | Status: DC | PRN
  Administered 2019-07-14: 1 via ORAL
  Filled 2019-07-11: qty 1

## 2019-07-11 MED ORDER — METOPROLOL SUCCINATE ER 50 MG PO TB24
50.0000 mg | ORAL_TABLET | Freq: Every day | ORAL | Status: DC
  Administered 2019-07-12 – 2019-07-18 (×7): 50 mg via ORAL
  Filled 2019-07-11 (×7): qty 1

## 2019-07-11 MED ORDER — PANTOPRAZOLE SODIUM 40 MG PO TBEC
40.0000 mg | DELAYED_RELEASE_TABLET | Freq: Every day | ORAL | Status: DC
  Administered 2019-07-12 – 2019-07-18 (×7): 40 mg via ORAL
  Filled 2019-07-11 (×7): qty 1

## 2019-07-11 MED ORDER — FENTANYL CITRATE (PF) 100 MCG/2ML IJ SOLN
50.0000 ug | INTRAMUSCULAR | Status: AC | PRN
  Administered 2019-07-11 (×2): 50 ug via INTRAVENOUS
  Filled 2019-07-11 (×3): qty 2

## 2019-07-11 NOTE — ED Notes (Signed)
Pt requesting additional pain medications , Pt states " I am very uncomfortable and in a lot of pain"

## 2019-07-11 NOTE — ED Triage Notes (Signed)
Patient brought in by [facility] from home. Patient was hanging a shower curtain and hit her head on the left side. Patient is having shortening to right leg per EMS.

## 2019-07-11 NOTE — ED Notes (Signed)
Pt daughter attempted to be notified but call went straight to voicemail. HIPAA compliant voicemail left.

## 2019-07-11 NOTE — Consult Note (Signed)
Reason for Consult:Right intertrochanteric femur fracture Referring Physician: Dr. Achilles Dunk Carrie Barnes is an 83 y.o. female.  HPI: Ms. Croll is an 83 yo female who was hanging curtains this afternoon and slipped while getting on a stool, falling and landing on her right side. She had immediate right hip pain and inability to get up. She did not have a loss of consciousness. She complains of right hip pain and buttock pain. She is not have any lower extremity paresthesia or radiating pain. 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   Past Medical History:  Diagnosis Date  . Cholecystitis   . Coronary artery disease    a. 03/2017: 80-90% LAD stenosis (PCI/DES placement with a 2.75x16 mm Promus Premier stent). No significant stenosis along RCA or LCx.   Marland Kitchen DJD (degenerative joint disease)   . Hyperlipidemia   . Hypertension    dx s/p MI   . Ischemic cardiomyopathy   . Myocardial infarction [facility]) 03/17/2017   03-2017 [facility]   . Osteoporosis   . Status post insertion of drug-eluting stent into left anterior descending (LAD) artery 03/2017    Past Surgical History:  Procedure Laterality Date  . APPENDECTOMY    . CESAREAN SECTION    . CHOLECYSTECTOMY N/A 11/05/2017   Procedure: LAPAROSCOPIC CHOLECYSTECTOMY WITH INTRAOPERATIVE CHOLANGIOGRAM;  Surgeon: Armandina Gemma, MD;  Location: [facility];  Service: General;  Laterality: N/A;  . ERCP N/A 11/11/2017   Procedure: ENDOSCOPIC RETROGRADE CHOLANGIOPANCREATOGRAPHY (ERCP);  Surgeon: Arta Silence, MD;  Location: [facility];  Service: Endoscopy;  Laterality: N/A;  possible  . EUS N/A 11/11/2017   Procedure: UPPER ENDOSCOPIC ULTRASOUND (EUS) RADIAL;  Surgeon: Arta Silence, MD;  Location: [facility];  Service: Endoscopy;  Laterality: N/A;  . stent  Left 03/2017   anterior descending ; drug eluting ; [facility] of MI at [facility]   . TONSILLECTOMY    . VAGINAL HYSTERECTOMY    . vaginal sling       Family History  Problem Relation Age of Onset  . Heart disease Father   . Breast cancer Sister   . Pancreatic cancer Sister     Social History:  reports that she has quit smoking. Her smoking use included cigarettes. She has a 30.00 pack-year smoking history. She has never used smokeless tobacco. She reports current alcohol use. She reports that she does not use drugs.  Allergies: No Known Allergies  Medications: I have reviewed the patient's current medications.  Results for orders placed or performed during the hospital encounter of 07/11/19 (from the past 48 hour(s))  ABO/Rh     Status: None   Collection Time: 07/11/19  4:03 PM  Result Value Ref Range   ABO/RH(D)      A POS Performed at [facility], [place] [address] [place], [place] [address]   Basic metabolic panel     Status: Abnormal   Collection Time: 07/11/19  4:04 PM  Result Value Ref Range   Sodium 139 135 - 145 mmol/L   Potassium 4.3 3.5 - 5.1 mmol/L   Chloride 104 98 - 111 mmol/L   CO2 24 22 - 32 mmol/L   Glucose, Bld 140 (H) 70 - 99 mg/dL   BUN 15 8 - 23 mg/dL   Creatinine, Ser 0.89 0.44 - 1.00 mg/dL   Calcium 9.2 8.9 - 10.3 mg/dL   GFR calc non Af Amer >60 >60 mL/min   GFR calc Af Amer >  60 >60 mL/min   Anion gap 11 5 - 15    Comment: Performed at [facility], [address] [place], [place] [address]  Protime-INR     Status: None   Collection Time: 07/11/19  4:04 PM  Result Value Ref Range   Prothrombin Time 13.2 11.4 - 15.2 seconds   INR 1.0 0.8 - 1.2    Comment: (NOTE) INR goal varies based on device and disease states. Performed at [facility], [address] [place], [place] [address]   Type and screen [facility]     Status: None   Collection Time: 07/11/19  4:04 PM  Result Value Ref Range   ABO/RH(D) A POS    Antibody Screen NEG    Sample Expiration      07/14/2019,2359 Performed at [facility], [address] [place], [place] [address]     Dg Chest 1 View  Result Date: 07/11/2019 CLINICAL DATA:  Preop. Pain status post fall. EXAM: CHEST  1 VIEW COMPARISON:  None. FINDINGS: There is no pneumothorax. No large pleural effusion. The heart size is mildly enlarged. There is scarring versus atelectasis at the lung bases. There appear to be multiple calcified granulomas in the right lower lobe. There is no acute osseous abnormality. Aortic calcifications are noted. IMPRESSION: 1. No pneumothorax or large pleural effusion. 2. Bibasilar scarring versus atelectasis. 3. Multiple calcified granulomas in the right lower lobe. Electronically Signed   By: Katherine Mantle M.D.   On: 07/11/2019 18:32   Ct Head Wo Contrast  Result Date: 07/11/2019 CLINICAL DATA:  Fall.  Head injury EXAM: CT HEAD WITHOUT CONTRAST CT CERVICAL SPINE WITHOUT CONTRAST TECHNIQUE: Multidetector CT imaging of the head and cervical spine was performed following the standard protocol without intravenous contrast. Multiplanar CT image reconstructions of the cervical spine were also generated. COMPARISON:  None. FINDINGS: CT HEAD FINDINGS Brain: Mild atrophy and mild chronic microvascular ischemic changes in the white matter. Negative for acute hemorrhage, infarct, or mass. No midline shift. Vascular: Negative for hyperdense vessel Skull: Negative for fracture.  Right frontal scalp hematoma. Sinuses/Orbits: Paranasal sinuses clear. Bilateral cataract surgery. Other: None CT CERVICAL SPINE FINDINGS Alignment: Mild anterolisthesis C6-7 otherwise normal alignment Skull base and vertebrae: Negative for fracture Soft tissues and spinal canal: Negative for soft tissue mass or swelling. Atherosclerotic calcification in the carotid bifurcation bilaterally. Disc levels: Diffuse facet degeneration right greater than left. Disc degeneration and spurring most prominent at C6-7. Mild spinal stenosis at C6-7 due to spurring. Upper chest:  Negative Other: 8 mm right thyroid nodule. No further imaging is required for this lesion. IMPRESSION: 1. Atrophy and chronic microvascular ischemic change in the white matter. No acute intracranial abnormality. Right frontal scalp hematoma 2. Negative for cervical spine fracture. Electronically Signed   By: Marlan Palau M.D.   On: 07/11/2019 17:00   Ct Cervical Spine Wo Contrast  Result Date: 07/11/2019 CLINICAL DATA:  Fall.  Head injury EXAM: CT HEAD WITHOUT CONTRAST CT CERVICAL SPINE WITHOUT CONTRAST TECHNIQUE: Multidetector CT imaging of the head and cervical spine was performed following the standard protocol without intravenous contrast. Multiplanar CT image reconstructions of the cervical spine were also generated. COMPARISON:  None. FINDINGS: CT HEAD FINDINGS Brain: Mild atrophy and mild chronic microvascular ischemic changes in the white matter. Negative for acute hemorrhage, infarct, or mass. No midline shift. Vascular: Negative for hyperdense vessel Skull: Negative for fracture.  Right frontal scalp hematoma. Sinuses/Orbits: Paranasal  sinuses clear. Bilateral cataract surgery. Other: None CT CERVICAL SPINE FINDINGS Alignment: Mild anterolisthesis C6-7 otherwise normal alignment Skull base and vertebrae: Negative for fracture Soft tissues and spinal canal: Negative for soft tissue mass or swelling. Atherosclerotic calcification in the carotid bifurcation bilaterally. Disc levels: Diffuse facet degeneration right greater than left. Disc degeneration and spurring most prominent at C6-7. Mild spinal stenosis at C6-7 due to spurring. Upper chest: Negative Other: 8 mm right thyroid nodule. No further imaging is required for this lesion. IMPRESSION: 1. Atrophy and chronic microvascular ischemic change in the white matter. No acute intracranial abnormality. Right frontal scalp hematoma 2. Negative for cervical spine fracture. Electronically Signed   By: Marlan Palau M.D.   On: 07/11/2019 17:00   Dg Hip  Unilat With Pelvis 2-3 Views Right  Result Date: 07/11/2019 CLINICAL DATA:  Painful right hip after fall EXAM: DG HIP (WITH OR WITHOUT PELVIS) 2-3V RIGHT COMPARISON:  CT 03/02/2017 FINDINGS: SI joints are non widened. Acute comminuted right intertrochanteric fracture with displaced lesser trochanteric fracture fragment. Pubic symphysis appears intact. The femoral head is normally position. IMPRESSION: Acute comminuted right intertrochanteric fracture Electronically Signed   By: Jasmine Pang M.D.   On: 07/11/2019 17:36    ROS Blood pressure (!) 147/70, pulse 100, temperature 98.3 F (36.8 C), temperature source Oral, resp. rate 16, height 5\' 2"  (1.575 m), weight 76.2 kg, SpO2 95 %. Physical Exam Physical Examination: General appearance - alert, well appearing, and in no distress Mental status - alert, oriented to person, place, and time Neurological - alert, oriented, normal speech, no focal findings or movement disorder noted Right lower extremity is shortened and rotated externally; EHL/FHL/TA and gastroc are intact. Sensation and pulses are intact  X-ray- Displaced right intertrochanteric femur fracture  Assessment/Plan: Right femur fracture- Plan operative fixation with IM nail. Will be getting cardiac clearance first. I have given the patient the option of having this done by one of my partners tomorrow or by me on Wednesday and she would like me to do it on Wednesday. We will plan on surgery then  Wednesday 07/11/2019, 9:01 PM

## 2019-07-11 NOTE — ED Notes (Signed)
Patient transported to X-ray 

## 2019-07-11 NOTE — ED Notes (Signed)
Awaiting room availability to get pt covid swab and urine sample

## 2019-07-11 NOTE — ED Notes (Signed)
Pt asked me to contact family for update. I called daughter Deeann Cree listed as patients emergency contact in her chart and updated on patient status.

## 2019-07-11 NOTE — ED Notes (Signed)
Pt's right leg elevated with a pillow. Ice applied to right hip. Pt states she is now comfortable.

## 2019-07-11 NOTE — H&P (View-Only) (Signed)
Reason for Consult:Right intertrochanteric femur fracture Referring Physician: Dr. Achilles Dunk Carrie Barnes is an 83 y.o. female.  HPI: Carrie Barnes is an 83 yo female who was hanging curtains this afternoon and slipped while getting on a stool, falling and landing on her right side. She had immediate right hip pain and inability to get up. She did not have a loss of consciousness. She complains of right hip pain and buttock pain. She is not have any lower extremity paresthesia or radiating pain. 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   Past Medical History:  Diagnosis Date  . Cholecystitis   . Coronary artery disease    a. 03/2017: 80-90% LAD stenosis (PCI/DES placement with a 2.75x16 mm Promus Premier stent). No significant stenosis along RCA or LCx.   Marland Kitchen DJD (degenerative joint disease)   . Hyperlipidemia   . Hypertension    dx s/p MI   . Ischemic cardiomyopathy   . Myocardial infarction [facility]) 03/17/2017   03-2017 treated at [facility]   . Osteoporosis   . Status post insertion of drug-eluting stent into left anterior descending (LAD) artery 03/2017    Past Surgical History:  Procedure Laterality Date  . APPENDECTOMY    . CESAREAN SECTION    . CHOLECYSTECTOMY N/A 11/05/2017   Procedure: LAPAROSCOPIC CHOLECYSTECTOMY WITH INTRAOPERATIVE CHOLANGIOGRAM;  Surgeon: Armandina Gemma, MD;  Location: [facility];  Service: General;  Laterality: N/A;  . ERCP N/A 11/11/2017   Procedure: ENDOSCOPIC RETROGRADE CHOLANGIOPANCREATOGRAPHY (ERCP);  Surgeon: Arta Silence, MD;  Location: [facility];  Service: Endoscopy;  Laterality: N/A;  possible  . EUS N/A 11/11/2017   Procedure: UPPER ENDOSCOPIC ULTRASOUND (EUS) RADIAL;  Surgeon: Arta Silence, MD;  Location: [facility];  Service: Endoscopy;  Laterality: N/A;  . stent  Left 03/2017   anterior descending ; drug eluting ; tx of MI at [facility]   . TONSILLECTOMY    . VAGINAL HYSTERECTOMY    . vaginal sling       Family History  Problem Relation Age of Onset  . Heart disease Father   . Breast cancer Sister   . Pancreatic cancer Sister     Social History:  reports that she has quit smoking. Her smoking use included cigarettes. She has a 30.00 pack-year smoking history. She has never used smokeless tobacco. She reports current alcohol use. She reports that she does not use drugs.  Allergies: No Known Allergies  Medications: I have reviewed the patient's current medications.  Results for orders placed or performed during the hospital encounter of 07/11/19 (from the past 48 hour(s))  ABO/Rh     Status: None   Collection Time: 07/11/19  4:03 PM  Result Value Ref Range   ABO/RH(D)      A POS Performed at [facility], [place] [address] [place], [place] [address]   Basic metabolic panel     Status: Abnormal   Collection Time: 07/11/19  4:04 PM  Result Value Ref Range   Sodium 139 135 - 145 mmol/L   Potassium 4.3 3.5 - 5.1 mmol/L   Chloride 104 98 - 111 mmol/L   CO2 24 22 - 32 mmol/L   Glucose, Bld 140 (H) 70 - 99 mg/dL   BUN 15 8 - 23 mg/dL   Creatinine, Ser 0.89 0.44 - 1.00 mg/dL   Calcium 9.2 8.9 - 10.3 mg/dL   GFR calc non Af Amer >60 >60 mL/min   GFR calc Af Amer >  60 >60 mL/min   Anion gap 11 5 - 15    Comment: Performed at [facility], [address] [place], [place] [address]  Protime-INR     Status: None   Collection Time: 07/11/19  4:04 PM  Result Value Ref Range   Prothrombin Time 13.2 11.4 - 15.2 seconds   INR 1.0 0.8 - 1.2    Comment: (NOTE) INR goal varies based on device and disease states. Performed at [facility], [address] [place], [place] [address]   Type and screen [facility]     Status: None   Collection Time: 07/11/19  4:04 PM  Result Value Ref Range   ABO/RH(D) A POS    Antibody Screen NEG    Sample Expiration      07/14/2019,2359 Performed at [facility], [address] , [place] [address]     Dg Chest 1 View  Result Date: 07/11/2019 CLINICAL DATA:  Preop. Pain status post fall. EXAM: CHEST  1 VIEW COMPARISON:  None. FINDINGS: There is no pneumothorax. No large pleural effusion. The heart size is mildly enlarged. There is scarring versus atelectasis at the lung bases. There appear to be multiple calcified granulomas in the right lower lobe. There is no acute osseous abnormality. Aortic calcifications are noted. IMPRESSION: 1. No pneumothorax or large pleural effusion. 2. Bibasilar scarring versus atelectasis. 3. Multiple calcified granulomas in the right lower lobe. Electronically Signed   By: Christopher  Green M.D.   On: 07/11/2019 18:32   Ct Head Wo Contrast  Result Date: 07/11/2019 CLINICAL DATA:  Fall.  Head injury EXAM: CT HEAD WITHOUT CONTRAST CT CERVICAL SPINE WITHOUT CONTRAST TECHNIQUE: Multidetector CT imaging of the head and cervical spine was performed following the standard protocol without intravenous contrast. Multiplanar CT image reconstructions of the cervical spine were also generated. COMPARISON:  None. FINDINGS: CT HEAD FINDINGS Brain: Mild atrophy and mild chronic microvascular ischemic changes in the white matter. Negative for acute hemorrhage, infarct, or mass. No midline shift. Vascular: Negative for hyperdense vessel Skull: Negative for fracture.  Right frontal scalp hematoma. Sinuses/Orbits: Paranasal sinuses clear. Bilateral cataract surgery. Other: None CT CERVICAL SPINE FINDINGS Alignment: Mild anterolisthesis C6-7 otherwise normal alignment Skull base and vertebrae: Negative for fracture Soft tissues and spinal canal: Negative for soft tissue mass or swelling. Atherosclerotic calcification in the carotid bifurcation bilaterally. Disc levels: Diffuse facet degeneration right greater than left. Disc degeneration and spurring most prominent at C6-7. Mild spinal stenosis at C6-7 due to spurring. Upper chest:  Negative Other: 8 mm right thyroid nodule. No further imaging is required for this lesion. IMPRESSION: 1. Atrophy and chronic microvascular ischemic change in the white matter. No acute intracranial abnormality. Right frontal scalp hematoma 2. Negative for cervical spine fracture. Electronically Signed   By: Charles  Clark M.D.   On: 07/11/2019 17:00   Ct Cervical Spine Wo Contrast  Result Date: 07/11/2019 CLINICAL DATA:  Fall.  Head injury EXAM: CT HEAD WITHOUT CONTRAST CT CERVICAL SPINE WITHOUT CONTRAST TECHNIQUE: Multidetector CT imaging of the head and cervical spine was performed following the standard protocol without intravenous contrast. Multiplanar CT image reconstructions of the cervical spine were also generated. COMPARISON:  None. FINDINGS: CT HEAD FINDINGS Brain: Mild atrophy and mild chronic microvascular ischemic changes in the white matter. Negative for acute hemorrhage, infarct, or mass. No midline shift. Vascular: Negative for hyperdense vessel Skull: Negative for fracture.  Right frontal scalp hematoma. Sinuses/Orbits: Paranasal   sinuses clear. Bilateral cataract surgery. Other: None CT CERVICAL SPINE FINDINGS Alignment: Mild anterolisthesis C6-7 otherwise normal alignment Skull base and vertebrae: Negative for fracture Soft tissues and spinal canal: Negative for soft tissue mass or swelling. Atherosclerotic calcification in the carotid bifurcation bilaterally. Disc levels: Diffuse facet degeneration right greater than left. Disc degeneration and spurring most prominent at C6-7. Mild spinal stenosis at C6-7 due to spurring. Upper chest: Negative Other: 8 mm right thyroid nodule. No further imaging is required for this lesion. IMPRESSION: 1. Atrophy and chronic microvascular ischemic change in the white matter. No acute intracranial abnormality. Right frontal scalp hematoma 2. Negative for cervical spine fracture. Electronically Signed   By: Marlan Palau M.D.   On: 07/11/2019 17:00   Dg Hip  Unilat With Pelvis 2-3 Views Right  Result Date: 07/11/2019 CLINICAL DATA:  Painful right hip after fall EXAM: DG HIP (WITH OR WITHOUT PELVIS) 2-3V RIGHT COMPARISON:  CT 03/02/2017 FINDINGS: SI joints are non widened. Acute comminuted right intertrochanteric fracture with displaced lesser trochanteric fracture fragment. Pubic symphysis appears intact. The femoral head is normally position. IMPRESSION: Acute comminuted right intertrochanteric fracture Electronically Signed   By: Jasmine Pang M.D.   On: 07/11/2019 17:36    ROS Blood pressure (!) 147/70, pulse 100, temperature 98.3 F (36.8 C), temperature source Oral, resp. rate 16, height 5\' 2"  (1.575 m), weight 76.2 kg, SpO2 95 %. Physical Exam Physical Examination: General appearance - alert, well appearing, and in no distress Mental status - alert, oriented to person, place, and time Neurological - alert, oriented, normal speech, no focal findings or movement disorder noted Right lower extremity is shortened and rotated externally; EHL/FHL/TA and gastroc are intact. Sensation and pulses are intact  X-ray- Displaced right intertrochanteric femur fracture  Assessment/Plan: Right femur fracture- Plan operative fixation with IM nail. Will be getting cardiac clearance first. I have given the patient the option of having this done by one of my partners tomorrow or by me on Wednesday and she would like me to do it on Wednesday. We will plan on surgery then  Wednesday 07/11/2019, 9:01 PM

## 2019-07-11 NOTE — H&P (Signed)
Carrie Barnes NMM:[id number] DOB: [birth year] DOA: 07/11/2019     PCP: Renford Dills, MD   Outpatient Specialists:   CARDS:  Dr. Clifton James    Patient arrived to ER on 07/11/19 at 1520  Patient coming from: home Lives alone,        Chief Complaint:   Chief Complaint  Patient presents with   Fall    HPI: Carrie Barnes is a 83 y.o. female with medical history significant of  CAD, ischemic cardiomyopathy, HTN, HLD and spinal stenosis , osteoporosis on Fosomax  Presented with   fall while hanging a shower curtain fell up to her left side noted to have right leg shortening EMS was called and patient was transferred to emergency department Patient apparently recently moved to her apartment and was trying to hang up her shoulder curtains when she had a mechanical fall. -Hitting her head feels that she landed on her right hip.  No LOC.  She is on aspirin but not on any other blood thinners.  Given 100 mcg of fentanyl by EMS in route.  Infectious risk factors:  Reports none   In  ER RAPID COVID TEST   in house  PCR testing  Pending  No results found for: SARSCOV2NAA   Regarding pertinent Chronic problems:    Hyperlipidemia -  on statins lpitor   HTN on cozaar, toprolol   chronic CHF  Systolic   -    LVEF at the time of her MI was 35-40% but improved to 55-60% on echo September 2018    CAD  - On Aspirin, statin, betablocker,                 -  followed by cardiology               was admitted to [facility] in August 2018 with an MI and had a drug eluting stent placed in the LAD.   obesity-   BMI Readings from Last 1 Encounters:  07/11/19 30.73 kg/m     While in ER:    ER Provider Called: [facility]    Dr. Berton Lan They Recommend admit to medicine   Npo post md and Or time in AM  Will see in AM   The following Work up has been ordered so far:  Orders Placed This Encounter  Procedures   CT HEAD WO CONTRAST   CT  CERVICAL SPINE WO CONTRAST   DG Hip Unilat With Pelvis 2-3 Views Right   DG Chest 1 View   Basic metabolic panel   CBC WITH DIFFERENTIAL   Protime-INR   Urinalysis, Routine w reflex microscopic   CBC with Differential/Platelet   Diet NPO time specified   Check CMS   Bed rest   Initiate Carrier Fluid Protocol   NPO Except for Meds   Saline Lock IV, Maintain IV access   Consult to orthopedic surgery  ALL PATIENTS BEING ADMITTED/HAVING PROCEDURES NEED COVID-19 SCREENING   Consult to hospitalist  ALL PATIENTS BEING ADMITTED/HAVING PROCEDURES NEED COVID-19 SCREENING   ED EKG   Type and screen [facility]   ABO/Rh   Insert peripheral IV    Following Medications were ordered in ER: Medications  0.9 %  sodium chloride infusion (has no administration in time range)  fentaNYL (SUBLIMAZE) injection 50 mcg (50 mcg Intravenous Given 07/11/19 1605)  ondansetron (ZOFRAN) injection 4 mg (4 mg Intravenous Given 07/11/19 1605)  LORazepam (ATIVAN) injection 1 mg (  1 mg Intravenous Given 07/11/19 1740)        Consult Orders  (From admission, onward)         Start     Ordered   07/11/19 1824  Consult to hospitalist  ALL PATIENTS BEING ADMITTED/HAVING PROCEDURES NEED COVID-19 SCREENING  Once    Comments: ALL PATIENTS BEING ADMITTED/HAVING PROCEDURES NEED COVID-19 SCREENING  Provider:  (Not yet assigned)  Question Answer Comment  Place call to: [facility]   Reason for Consult Admit      07/11/19 1823          Significant initial  Findings: Abnormal Labs Reviewed  BASIC METABOLIC PANEL - Abnormal; Notable for the following components:      Result Value   Glucose, Bld 140 (*)    All other components within normal limits    Otherwise labs showing:    Recent Labs  Lab 07/11/19 1604  NA 139  K 4.3  CO2 24  GLUCOSE 140*  BUN 15  CREATININE 0.89  CALCIUM 9.2    Cr   stable,   Lab Results  Component Value Date   CREATININE 0.89  07/11/2019   CREATININE 0.77 11/06/2017   CREATININE 0.72 10/29/2017    No results for input(s): AST, ALT, ALKPHOS, BILITOT, PROT, ALBUMIN in the last 168 hours. Lab Results  Component Value Date   CALCIUM 9.2 07/11/2019     WBC     Component Value Date/Time   WBC 9.9 11/06/2017 0722   ANC    Component Value Date/Time   NEUTROABS 7.7 11/06/2017 0722   ALC No components found for: LYMPHAB    Plt: Lab Results  Component Value Date   PLT 155 11/06/2017     COVID-19 Labs  No results for input(s): DDIMER, FERRITIN, LDH, CRP in the last 72 hours.  No results found for: SARSCOV2NAA    HG/HCT  Down from baseline see below of 11.7 in 2019    Component Value Date/Time   HGB 10.1 (L) 11/06/2017 0722   HCT 30.2 (L) 11/06/2017 0722       ECG: Ordered Personally reviewed by me showing: HR : 87 Rhythm:  NSR,    no evidence of ischemic changes QTC 459      UA not ordered       Ordered  CT HEAD cervical spine   NON acute  Hip film comminuted right intertrochanteric fracture CXR -  NON acute       ED Triage Vitals  Enc Vitals Group     BP 07/11/19 1535 136/66     Pulse Rate 07/11/19 1535 74     Resp 07/11/19 1535 18     Temp 07/11/19 1535 98.3 F (36.8 C)     Temp Source 07/11/19 1535 Oral     SpO2 07/11/19 1535 96 %     Weight 07/11/19 1537 168 lb (76.2 kg)     Height 07/11/19 1537  (1.575 m)     Head Circumference --      Peak Flow --      Pain Score 07/11/19 1537 5     Pain Loc --      Pain Edu? --      Excl. in GC? --   TMAX(24)@       Latest  Blood pressure (!) 136/54, pulse 88, temperature 98.3 F (36.8 C), temperature source Oral, resp. rate 18, height  (1.575 m), weight 76.2 kg, SpO2 92 %.  Hospitalist was called for admission for right hip intertrochanteric fracture   Review of Systems:    Pertinent positives include: fall with right hip pain  Constitutional:  No weight loss, night sweats, Fevers, chills, fatigue,  weight loss  HEENT:  No headaches, Difficulty swallowing,Tooth/dental problems,Sore throat,  No sneezing, itching, ear ache, nasal congestion, post nasal drip,  Cardio-vascular:  No chest pain, Orthopnea, PND, anasarca, dizziness, palpitations.no Bilateral lower extremity swelling  GI:  No heartburn, indigestion, abdominal pain, nausea, vomiting, diarrhea, change in bowel habits, loss of appetite, melena, blood in stool, hematemesis Resp:  no shortness of breath at rest. No dyspnea on exertion, No excess mucus, no productive cough, No non-productive cough, No coughing up of blood.No change in color of mucus.No wheezing. Skin:  no rash or lesions. No jaundice GU:  no dysuria, change in color of urine, no urgency or frequency. No straining to urinate.  No flank pain.  Musculoskeletal:  No joint pain or no joint swelling. No decreased range of motion. No back pain.  Psych:  No change in mood or affect. No depression or anxiety. No memory loss.  Neuro: no localizing neurological complaints, no tingling, no weakness, no double vision, no gait abnormality, no slurred speech, no confusion  All systems reviewed and apart from HOPI all are negative  Past Medical History:   Past Medical History:  Diagnosis Date   Cholecystitis    Coronary artery disease    a. 03/2017: 80-90% LAD stenosis (PCI/DES placement with a 2.75x16 mm Promus Premier stent). No significant stenosis along RCA or LCx.    DJD (degenerative joint disease)    Hyperlipidemia    Hypertension    dx s/p MI    Ischemic cardiomyopathy    Myocardial infarction [facility](HCC) 03/17/2017   03-2017 treated at [facility]    Osteoporosis    Status post insertion of drug-eluting stent into left anterior descending (LAD) artery 03/2017      Past Surgical History:  Procedure Laterality Date   APPENDECTOMY     CESAREAN SECTION     CHOLECYSTECTOMY N/A 11/05/2017   Procedure: LAPAROSCOPIC CHOLECYSTECTOMY WITH  INTRAOPERATIVE CHOLANGIOGRAM;  Surgeon: Darnell LevelGerkin, Todd, MD;  Location: [facility]S;  Service: General;  Laterality: N/A;   ERCP N/A 11/11/2017   Procedure: ENDOSCOPIC RETROGRADE CHOLANGIOPANCREATOGRAPHY (ERCP);  Surgeon: Willis Modenautlaw, William, MD;  [facility];  Service: Endoscopy;  Laterality: N/A;  possible   EUS N/A 11/11/2017   Procedure: UPPER ENDOSCOPIC ULTRASOUND (EUS) RADIAL;  Surgeon: Willis Modenautlaw, William, MD;  Location: [facility];  Service: Endoscopy;  Laterality: N/A;   stent  Left 03/2017   anterior descending ; drug eluting ; tx of MI at [facility]    TONSILLECTOMY     VAGINAL HYSTERECTOMY     vaginal sling      Social History:  Ambulatory   independently       reports that she has quit smoking. Her smoking use included cigarettes. She has a 30.00 pack-year smoking history. She has never used smokeless tobacco. She reports current alcohol use. She reports that she does not use drugs.   Family History: Counseling   Family History  Problem Relation Age of Onset   Heart disease Father    Breast cancer Sister    Pancreatic cancer Sister     Allergies: No Known Allergies   Prior to Admission medications   Medication Sig Start Date End Date Taking? Authorizing Provider  acetaminophen (TYLENOL) 500 MG tablet Take 500 mg  by mouth daily as needed for moderate pain or headache.   Yes [provider]  alendronate (FOSAMAX) 70 MG tablet Take 70 mg by mouth once a week. Mondays   Yes [provider]  aspirin EC 81 MG tablet Take 81 mg by mouth daily.   Yes [provider]  atorvastatin (LIPITOR) 80 MG tablet TAKE 1 TABLET BY MOUTH  DAILY Patient taking differently: Take 80 mg by mouth daily.  05/27/19  Yes Burnell Blanks, MD  calcium carbonate (OSCAL) 1500 (600 Ca) MG TABS tablet Take 600 mg of elemental calcium by mouth 2 (two) times daily with a meal.   Yes [provider]  cetirizine (ZYRTEC) 10 MG tablet Take 10  mg by mouth daily as needed for allergies.    Yes [provider]  Cholecalciferol (VITAMIN D3) 2000 units capsule Take 2,000 Units by mouth daily.   Yes [provider]  CRANBERRY EXTRACT PO Take 1 capsule by mouth daily.   Yes [provider]  losartan (COZAAR) 25 MG tablet Take 2 tablets (50 mg total) by mouth daily. 11/01/18  Yes Burnell Blanks, MD  metoprolol succinate (TOPROL-XL) 50 MG 24 hr tablet TAKE 1 TABLET BY MOUTH  DAILY Patient taking differently: Take 50 mg by mouth daily.  05/27/19  Yes Burnell Blanks, MD  Multiple Vitamin (MULTI-VITAMINS) TABS Take 1 tablet by mouth daily.   Yes [provider]  nitroGLYCERIN (NITROSTAT) 0.4 MG SL tablet Place 1 tablet (0.4 mg total) under the tongue every 5 (five) minutes as needed for chest pain. 03/31/17  Yes Imogene Burn, PA-C  Omega-3 Fatty Acids (FISH OIL) 1200 MG CAPS Take 1,200 mg by mouth 2 (two) times daily.    Yes [provider]  omeprazole (PRILOSEC) 20 MG capsule Take 20 mg by mouth daily.   Yes [provider]  Polyethyl Glycol-Propyl Glycol (SYSTANE OP) Place 1 drop into both eyes daily as needed (dry eyes).   Yes [provider]  vitamin C (ASCORBIC ACID) 500 MG tablet Take 500 mg by mouth daily.   Yes [provider]   Physical Exam: Blood pressure (!) 136/54, pulse 88, temperature 98.3 F (36.8 C), temperature source Oral, resp. rate 18, height 5\' 2"  (1.575 m), weight 76.2 kg, SpO2 92 %. 1. General:  in No  Acute distress   well -appearing 2. Psychological: Alert and Oriented 3. Head/ENT:    Dry Mucous Membranes                          Head over right brow abrasion traumatic, neck supple                            Poor Dentition 4. SKIN:  decreased Skin turgor,  Skin clean Dry and intact no rash 5. Heart: Regular rate and rhythm no  Murmur, no Rub or gallop 6. Lungs:  no wheezes or crackles   7. Abdomen: Soft non-tender, Non  distended  obese  bowel sounds present 8. Lower extremities: no clubbing, cyanosis, no left edema 9. Neurologically Grossly intact, moving all 4 extremities equally   10. MSK: Normal range of motion except for right hip limited by pain   All other LABS:    No results for input(s): WBC, NEUTROABS, HGB, HCT, MCV, PLT in the last 168 hours.   Recent Labs  Lab 07/11/19 1604  NA 139  K 4.3  CL 104  CO2 24  GLUCOSE 140*  BUN 15  CREATININE 0.89  CALCIUM 9.2     No results for input(s): AST, ALT, ALKPHOS, BILITOT, PROT, ALBUMIN in the last 168 hours.     Cultures: No results found for: SDES, SPECREQUEST, CULT, REPTSTATUS   Radiological Exams on Admission: Ct Head Wo Contrast  Result Date: 07/11/2019 CLINICAL DATA:  Fall.  Head injury EXAM: CT HEAD WITHOUT CONTRAST CT CERVICAL SPINE WITHOUT CONTRAST TECHNIQUE: Multidetector CT imaging of the head and cervical spine was performed following the standard protocol without intravenous contrast. Multiplanar CT image reconstructions of the cervical spine were also generated. COMPARISON:  None. FINDINGS: CT HEAD FINDINGS Brain: Mild atrophy and mild chronic microvascular ischemic changes in the white matter. Negative for acute hemorrhage, infarct, or mass. No midline shift. Vascular: Negative for hyperdense vessel Skull: Negative for fracture.  Right frontal scalp hematoma. Sinuses/Orbits: Paranasal sinuses clear. Bilateral cataract surgery. Other: None CT CERVICAL SPINE FINDINGS Alignment: Mild anterolisthesis C6-7 otherwise normal alignment Skull base and vertebrae: Negative for fracture Soft tissues and spinal canal: Negative for soft tissue mass or swelling. Atherosclerotic calcification in the carotid bifurcation bilaterally. Disc levels: Diffuse facet degeneration right greater than left. Disc degeneration and spurring most prominent at C6-7. Mild spinal stenosis at C6-7 due to spurring. Upper chest: Negative Other: 8 mm right thyroid nodule.  No further imaging is required for this lesion. IMPRESSION: 1. Atrophy and chronic microvascular ischemic change in the white matter. No acute intracranial abnormality. Right frontal scalp hematoma 2. Negative for cervical spine fracture. Electronically Signed   By: Marlan Palau M.D.   On: 07/11/2019 17:00   Ct Cervical Spine Wo Contrast  Result Date: 07/11/2019 CLINICAL DATA:  Fall.  Head injury EXAM: CT HEAD WITHOUT CONTRAST CT CERVICAL SPINE WITHOUT CONTRAST TECHNIQUE: Multidetector CT imaging of the head and cervical spine was performed following the standard protocol without intravenous contrast. Multiplanar CT image reconstructions of the cervical spine were also generated. COMPARISON:  None. FINDINGS: CT HEAD FINDINGS Brain: Mild atrophy and mild chronic microvascular ischemic changes in the white matter. Negative for acute hemorrhage, infarct, or mass. No midline shift. Vascular: Negative for hyperdense vessel Skull: Negative for fracture.  Right frontal scalp hematoma. Sinuses/Orbits: Paranasal sinuses clear. Bilateral cataract surgery. Other: None CT CERVICAL SPINE FINDINGS Alignment: Mild anterolisthesis C6-7 otherwise normal alignment Skull base and vertebrae: Negative for fracture Soft tissues and spinal canal: Negative for soft tissue mass or swelling. Atherosclerotic calcification in the carotid bifurcation bilaterally. Disc levels: Diffuse facet degeneration right greater than left. Disc degeneration and spurring most prominent at C6-7. Mild spinal stenosis at C6-7 due to spurring. Upper chest: Negative Other: 8 mm right thyroid nodule. No further imaging is required for this lesion. IMPRESSION: 1. Atrophy and chronic microvascular ischemic change in the white matter. No acute intracranial abnormality. Right frontal scalp hematoma 2. Negative for cervical spine fracture. Electronically Signed   By: Marlan Palau M.D.   On: 07/11/2019 17:00    Chart has been reviewed    Assessment/Plan  83 y.o. female with medical history significant of  CAD, ischemic cardiomyopathy, HTN, HLD and spinal stenosis , osteoporosis on Fosomax  Admitted for right hip intertrochanteric fracture  Present on Admission:  Closed intertrochanteric fracture of right hip (HCC) -  - management as per orthopedics,  plan to operate  On Wednesday 07/13/19   Patient  not on anticoagulation    Hold aspirin Ordered type and screen,  order a vitamin D level  Patient at baseline able to walk a flight of stairs or 100 feet       Patient denies any chest pain or shortness of breath currently and/or with exertion,   ECG showing no evidence of acute ischemia   known history of coronary artery disease,  CHF  Given advanced age patient is at least moderate  risk   which has been discussed with family but at this point no furthther cardiac workup is indicated.   will order echo     Cardiology consult for further pre-op clearance given extensive cardiac disease    CAD in native artery -  - chronic, hld  Aspirin  Continue statin and beta blocker , preop clearance by cardiology    Ischemic cardiomyopathy - repeat ECHO, by exam appears compensated Chronic systolic CHF last echogram was in 2018 showing improvement in EF.  Will repeat prior to OR   Hyperlipemia -chornic stable cont home meds   Osteoporosis - on fosomax already but with new fracture now    Other plan as per orders.  DVT prophylaxis:  SCD     Code Status:  FULL CODE   as per patient   I had personally discussed CODE STATUS with patient    Family Communication:   Family not at  Bedside   Disposition Plan:     likely will need placement for rehabilitation                                           Would benefit from PT/OT eval prior to DC  Ordered                   Swallow eval - SLP ordered                   Social Work  consulted                                      Consults called: Dr. Berton LanAllusio With orthopedics     Email cardiology notify patient has been admitted for cardiac clearance prior to OR  Admission status:  ED Disposition    ED Disposition Condition Comment   Admit  The patient appears reasonably stabilized for admission considering the current resources, flow, and capabilities available in the ED at this time, and I doubt any other [facility] further screening and/or treatment in the ED prior to admission is  present.        inpatient     Expect 2 midnight stay secondary to severity of patient's current illness including    Severe lab/radiological/exam abnormalities including:   Right hip fracture and extensive comorbidities including:  CHF   CAD That are currently affecting medical management.   I expect  patient to be hospitalized for 2 midnights requiring inpatient medical care.  Patient is at high risk for adverse outcome (such as loss of life or disability) if not treated.  Indication for inpatient stay as follows:    severe pain requiring acute inpatient management,  inability to maintain oral hydration due to need to a surgical procedure   Need for operative/procedural  intervention   Need for   IV fluids      Level of care   tele  For 12H     Precautions: admitted as asymptomatic screening protocol  No active isolations    PPE: Used by the provider:   P100  eye Goggles,  Gloves    Javonne Louissaint 07/11/2019, 9:43 PM    [facility]     after 2 AM please page floor coverage PA If 7AM-7PM, please contact the day team taking care of the patient using [email or web address]

## 2019-07-11 NOTE — ED Provider Notes (Signed)
[facility]  HOSPITAL-EMERGENCY DEPT Provider Note   CSN: [id number] Arrival date & time: 07/11/19  1520     History   Chief Complaint Chief Complaint  Patient presents with   Fall    HPI Carrie Barnes is a 83 y.o. female.     Pt presents to the ED today with a fall.  She had just recently moved to a new apartment and was hanging up a shower curtain and slipped.  She did hit her head and landed on her right hip.  Pt denies loc.  She is not on blood thinners.  Pt given 100 mcg fentanyl by EMS.     Past Medical History:  Diagnosis Date   Cholecystitis    Coronary artery disease    a. 03/2017: 80-90% LAD stenosis (PCI/DES placement with a 2.75x16 mm Promus Premier stent). No significant stenosis along RCA or LCx.    DJD (degenerative joint disease)    Hyperlipidemia    Hypertension    dx s/p MI    Ischemic cardiomyopathy    Myocardial infarction (HCC) 03/17/2017   03-2017 treated at [facility]    Osteoporosis    Status post insertion of drug-eluting stent into left anterior descending (LAD) artery 03/2017    Patient Active Problem List   Diagnosis Date Noted   Closed intertrochanteric fracture of right hip (HCC) 07/11/2019   Chronic cholecystitis due to cholelithiasis with choledocholithiasis 11/05/2017   Cholelithiasis with chronic cholecystitis 11/01/2017   Cholelithiasis 05/18/2017   CAD in native artery 03/31/2017   Ischemic cardiomyopathy 03/31/2017   Hyperlipemia 03/31/2017   DJD (degenerative joint disease) 03/31/2017   Osteoporosis 03/31/2017    Past Surgical History:  Procedure Laterality Date   APPENDECTOMY     CESAREAN SECTION     CHOLECYSTECTOMY N/A 11/05/2017   Procedure: LAPAROSCOPIC CHOLECYSTECTOMY WITH INTRAOPERATIVE CHOLANGIOGRAM;  Surgeon: Darnell Level, MD;  Location: [facility] ORS;  Service: General;  Laterality: N/A;   ERCP N/A 11/11/2017   Procedure: ENDOSCOPIC RETROGRADE  CHOLANGIOPANCREATOGRAPHY (ERCP);  Surgeon: Willis Modena, MD;  Location: [facility];  Service: Endoscopy;  Laterality: N/A;  possible   EUS N/A 11/11/2017   Procedure: UPPER ENDOSCOPIC ULTRASOUND (EUS) RADIAL;  Surgeon: Willis Modena, MD;  Location: [facility] ENDOSCOPY;  Service: Endoscopy;  Laterality: N/A;   stent  Left 03/2017   anterior descending ; drug eluting ; tx of MI at [facility]    TONSILLECTOMY     VAGINAL HYSTERECTOMY     vaginal sling       OB History   No obstetric history on file.      Home Medications    Prior to Admission medications   Medication Sig Start Date End Date Taking? Authorizing Provider  acetaminophen (TYLENOL) 500 MG tablet Take 500 mg by mouth daily as needed for moderate pain or headache.   Yes [provider]  alendronate (FOSAMAX) 70 MG tablet Take 70 mg by mouth once a week. Mondays   Yes [provider]  aspirin EC 81 MG tablet Take 81 mg by mouth daily.   Yes [provider]  atorvastatin (LIPITOR) 80 MG tablet TAKE 1 TABLET BY MOUTH  DAILY Patient taking differently: Take 80 mg by mouth daily.  05/27/19  Yes Kathleene Hazel, MD  calcium carbonate (OSCAL) 1500 (600 Ca) MG TABS tablet Take 600 mg of elemental calcium by mouth 2 (two) times daily with a meal.   Yes [provider]  cetirizine (ZYRTEC) 10  MG tablet Take 10 mg by mouth daily as needed for allergies.    Yes [provider]  Cholecalciferol (VITAMIN D3) 2000 units capsule Take 2,000 Units by mouth daily.   Yes [provider]  CRANBERRY EXTRACT PO Take 1 capsule by mouth daily.   Yes [provider]  losartan (COZAAR) 25 MG tablet Take 2 tablets (50 mg total) by mouth daily. 11/01/18  Yes Kathleene Hazel, MD  metoprolol succinate (TOPROL-XL) 50 MG 24 hr tablet TAKE 1 TABLET BY MOUTH  DAILY Patient taking differently: Take 50 mg by mouth daily.  05/27/19  Yes Kathleene Hazel, MD    Multiple Vitamin (MULTI-VITAMINS) TABS Take 1 tablet by mouth daily.   Yes [provider]  nitroGLYCERIN (NITROSTAT) 0.4 MG SL tablet Place 1 tablet (0.4 mg total) under the tongue every 5 (five) minutes as needed for chest pain. 03/31/17  Yes Dyann Kief, PA-C  Omega-3 Fatty Acids (FISH OIL) 1200 MG CAPS Take 1,200 mg by mouth 2 (two) times daily.    Yes [provider]  omeprazole (PRILOSEC) 20 MG capsule Take 20 mg by mouth daily.   Yes [provider]  Polyethyl Glycol-Propyl Glycol (SYSTANE OP) Place 1 drop into both eyes daily as needed (dry eyes).   Yes [provider]  vitamin C (ASCORBIC ACID) 500 MG tablet Take 500 mg by mouth daily.   Yes [provider]    Family History Family History  Problem Relation Age of Onset   Heart disease Father    Breast cancer Sister    Pancreatic cancer Sister     Social History Social History   Tobacco Use   Smoking status: Former Smoker    Packs/day: 1.00    Years: 30.00    Pack years: 30.00    Types: Cigarettes   Smokeless tobacco: Never Used   Tobacco comment: quit 30 years   Substance Use Topics   Alcohol use: Yes    Comment: Social   Drug use: Never     Allergies   Patient has no known allergies.   Review of Systems Review of Systems  Musculoskeletal: Positive for back pain.       Right hip pain  Skin: Positive for wound.  All other systems reviewed and are negative.    Physical Exam Updated Vital Signs BP (!) 136/54    Pulse 88    Temp 98.3 F (36.8 C) (Oral)    Resp 18    Ht 5\' 2"  (1.575 m)    Wt 76.2 kg    SpO2 92%    BMI 30.73 kg/m   Physical Exam Vitals signs and nursing note reviewed.  Constitutional:      Appearance: Normal appearance.  HENT:     Head: Normocephalic.      Right Ear: External ear normal.     Left Ear: External ear normal.     Nose: Nose normal.     Mouth/Throat:     Mouth: Mucous membranes are moist.     Pharynx: Oropharynx  is clear.  Eyes:     Extraocular Movements: Extraocular movements intact.     Conjunctiva/sclera: Conjunctivae normal.     Pupils: Pupils are equal, round, and reactive to light.  Neck:     Comments: In c-collar Cardiovascular:     Rate and Rhythm: Normal rate and regular rhythm.     Pulses: Normal pulses.     Heart sounds: Normal heart sounds.  Pulmonary:  Effort: Pulmonary effort is normal.     Breath sounds: Normal breath sounds.  Abdominal:     General: Abdomen is flat. Bowel sounds are normal.     Palpations: Abdomen is soft.  Musculoskeletal:     Right hip: She exhibits decreased range of motion, bony tenderness and deformity.  Skin:    General: Skin is warm.     Capillary Refill: Capillary refill takes less than 2 seconds.  Neurological:     General: No focal deficit present.     Mental Status: She is alert and oriented to person, place, and time.  Psychiatric:        Mood and Affect: Mood normal.        Behavior: Behavior normal.        Thought Content: Thought content normal.        Judgment: Judgment normal.      ED Treatments / Results  Labs (all labs ordered are listed, but only abnormal results are displayed) Labs Reviewed  BASIC METABOLIC PANEL - Abnormal; Notable for the following components:      Result Value   Glucose, Bld 140 (*)    All other components within normal limits  SARS CORONAVIRUS 2 (TAT 6-24 HRS)  PROTIME-INR  CBC WITH DIFFERENTIAL/PLATELET  URINALYSIS, ROUTINE W REFLEX MICROSCOPIC  CBC WITH DIFFERENTIAL/PLATELET  CBC WITH DIFFERENTIAL/PLATELET  TYPE AND SCREEN  ABO/RH    EKG EKG Interpretation  Date/Time:  Monday July 11 2019 15:54:02 EST Ventricular Rate:  87 PR Interval:  150 QRS Duration: 86 QT Interval:  382 QTC Calculation: 459 R Axis:   41 Text Interpretation: Sinus rhythm with marked sinus arrhythmia Otherwise normal ECG PACs are new Confirmed by Jacalyn LefevreHaviland, Cyndel Griffey [phone]([id number]) on 07/11/2019 4:08:46  PM   Radiology Dg Chest 1 View  Result Date: 07/11/2019 CLINICAL DATA:  Preop. Pain status post fall. EXAM: CHEST  1 VIEW COMPARISON:  None. FINDINGS: There is no pneumothorax. No large pleural effusion. The heart size is mildly enlarged. There is scarring versus atelectasis at the lung bases. There appear to be multiple calcified granulomas in the right lower lobe. There is no acute osseous abnormality. Aortic calcifications are noted. IMPRESSION: 1. No pneumothorax or large pleural effusion. 2. Bibasilar scarring versus atelectasis. 3. Multiple calcified granulomas in the right lower lobe. Electronically Signed   By: Katherine Mantlehristopher  Green M.D.   On: 07/11/2019 18:32   Ct Head Wo Contrast  Result Date: 07/11/2019 CLINICAL DATA:  Fall.  Head injury EXAM: CT HEAD WITHOUT CONTRAST CT CERVICAL SPINE WITHOUT CONTRAST TECHNIQUE: Multidetector CT imaging of the head and cervical spine was performed following the standard protocol without intravenous contrast. Multiplanar CT image reconstructions of the cervical spine were also generated. COMPARISON:  None. FINDINGS: CT HEAD FINDINGS Brain: Mild atrophy and mild chronic microvascular ischemic changes in the white matter. Negative for acute hemorrhage, infarct, or mass. No midline shift. Vascular: Negative for hyperdense vessel Skull: Negative for fracture.  Right frontal scalp hematoma. Sinuses/Orbits: Paranasal sinuses clear. Bilateral cataract surgery. Other: None CT CERVICAL SPINE FINDINGS Alignment: Mild anterolisthesis C6-7 otherwise normal alignment Skull base and vertebrae: Negative for fracture Soft tissues and spinal canal: Negative for soft tissue mass or swelling. Atherosclerotic calcification in the carotid bifurcation bilaterally. Disc levels: Diffuse facet degeneration right greater than left. Disc degeneration and spurring most prominent at C6-7. Mild spinal stenosis at C6-7 due to spurring. Upper chest: Negative Other: 8 mm right thyroid nodule. No  further imaging is required for this lesion.  IMPRESSION: 1. Atrophy and chronic microvascular ischemic change in the white matter. No acute intracranial abnormality. Right frontal scalp hematoma 2. Negative for cervical spine fracture. Electronically Signed   By: Franchot Gallo M.D.   On: 07/11/2019 17:00   Ct Cervical Spine Wo Contrast  Result Date: 07/11/2019 CLINICAL DATA:  Fall.  Head injury EXAM: CT HEAD WITHOUT CONTRAST CT CERVICAL SPINE WITHOUT CONTRAST TECHNIQUE: Multidetector CT imaging of the head and cervical spine was performed following the standard protocol without intravenous contrast. Multiplanar CT image reconstructions of the cervical spine were also generated. COMPARISON:  None. FINDINGS: CT HEAD FINDINGS Brain: Mild atrophy and mild chronic microvascular ischemic changes in the white matter. Negative for acute hemorrhage, infarct, or mass. No midline shift. Vascular: Negative for hyperdense vessel Skull: Negative for fracture.  Right frontal scalp hematoma. Sinuses/Orbits: Paranasal sinuses clear. Bilateral cataract surgery. Other: None CT CERVICAL SPINE FINDINGS Alignment: Mild anterolisthesis C6-7 otherwise normal alignment Skull base and vertebrae: Negative for fracture Soft tissues and spinal canal: Negative for soft tissue mass or swelling. Atherosclerotic calcification in the carotid bifurcation bilaterally. Disc levels: Diffuse facet degeneration right greater than left. Disc degeneration and spurring most prominent at C6-7. Mild spinal stenosis at C6-7 due to spurring. Upper chest: Negative Other: 8 mm right thyroid nodule. No further imaging is required for this lesion. IMPRESSION: 1. Atrophy and chronic microvascular ischemic change in the white matter. No acute intracranial abnormality. Right frontal scalp hematoma 2. Negative for cervical spine fracture. Electronically Signed   By: Franchot Gallo M.D.   On: 07/11/2019 17:00   Dg Hip Unilat With Pelvis 2-3 Views Right  Result  Date: 07/11/2019 CLINICAL DATA:  Painful right hip after fall EXAM: DG HIP (WITH OR WITHOUT PELVIS) 2-3V RIGHT COMPARISON:  CT 03/02/2017 FINDINGS: SI joints are non widened. Acute comminuted right intertrochanteric fracture with displaced lesser trochanteric fracture fragment. Pubic symphysis appears intact. The femoral head is normally position. IMPRESSION: Acute comminuted right intertrochanteric fracture Electronically Signed   By: Donavan Foil M.D.   On: 07/11/2019 17:36    Procedures Procedures (including critical care time)  Medications Ordered in ED Medications  0.9 %  sodium chloride infusion (has no administration in time range)  fentaNYL (SUBLIMAZE) injection 50 mcg (50 mcg Intravenous Given 07/11/19 1605)  ondansetron (ZOFRAN) injection 4 mg (4 mg Intravenous Given 07/11/19 1605)  LORazepam (ATIVAN) injection 1 mg (1 mg Intravenous Given 07/11/19 1740)     Initial Impression / Assessment and Plan / ED Course  I have reviewed the triage vital signs and the nursing notes.  Pertinent labs & imaging results that were available during my care of the patient were reviewed by me and considered in my medical decision making (see chart for details).   Labs and xrays ordered.  Covid test ordered.    Pt d/w Dr. Maureen Ralphs (ortho) who will likely operate tomorrow.  He wants her here at [facility].  Pt d/w Dr. Roel Cluck (triad) who will admit.  Final Clinical Impressions(s) / ED Diagnoses   Final diagnoses:  Closed displaced intertrochanteric fracture of right femur, initial encounter [facility])  Fall, initial encounter    ED Discharge Orders    None       Isla Pence, MD 07/11/19 2030

## 2019-07-12 ENCOUNTER — Inpatient Hospital Stay (HOSPITAL_COMMUNITY): Payer: Medicare Other

## 2019-07-12 DIAGNOSIS — I5042 Chronic combined systolic (congestive) and diastolic (congestive) heart failure: Secondary | ICD-10-CM | POA: Diagnosis not present

## 2019-07-12 DIAGNOSIS — S72144A Nondisplaced intertrochanteric fracture of right femur, initial encounter for closed fracture: Secondary | ICD-10-CM

## 2019-07-12 DIAGNOSIS — Z01810 Encounter for preprocedural cardiovascular examination: Secondary | ICD-10-CM

## 2019-07-12 DIAGNOSIS — I361 Nonrheumatic tricuspid (valve) insufficiency: Secondary | ICD-10-CM | POA: Diagnosis not present

## 2019-07-12 DIAGNOSIS — I2510 Atherosclerotic heart disease of native coronary artery without angina pectoris: Secondary | ICD-10-CM

## 2019-07-12 LAB — URINALYSIS, ROUTINE W REFLEX MICROSCOPIC
Bilirubin Urine: NEGATIVE
Glucose, UA: NEGATIVE mg/dL
Hgb urine dipstick: NEGATIVE
Ketones, ur: NEGATIVE mg/dL
Nitrite: NEGATIVE
Protein, ur: 30 mg/dL — AB
Specific Gravity, Urine: 1.019 (ref 1.005–1.030)
pH: 5 (ref 5.0–8.0)

## 2019-07-12 LAB — CBC
HCT: 30.7 % — ABNORMAL LOW (ref 36.0–46.0)
Hemoglobin: 10.1 g/dL — ABNORMAL LOW (ref 12.0–15.0)
MCH: 30.2 pg (ref 26.0–34.0)
MCHC: 32.9 g/dL (ref 30.0–36.0)
MCV: 91.9 fL (ref 80.0–100.0)
Platelets: 145 10*3/uL — ABNORMAL LOW (ref 150–400)
RBC: 3.34 MIL/uL — ABNORMAL LOW (ref 3.87–5.11)
RDW: 11.9 % (ref 11.5–15.5)
WBC: 8 10*3/uL (ref 4.0–10.5)
nRBC: 0 % (ref 0.0–0.2)

## 2019-07-12 LAB — VITAMIN D 25 HYDROXY (VIT D DEFICIENCY, FRACTURES): Vit D, 25-Hydroxy: 30.73 ng/mL (ref 30–100)

## 2019-07-12 LAB — MRSA PCR SCREENING: MRSA by PCR: NEGATIVE

## 2019-07-12 LAB — BASIC METABOLIC PANEL
Anion gap: 9 (ref 5–15)
BUN: 15 mg/dL (ref 8–23)
CO2: 27 mmol/L (ref 22–32)
Calcium: 8.7 mg/dL — ABNORMAL LOW (ref 8.9–10.3)
Chloride: 101 mmol/L (ref 98–111)
Creatinine, Ser: 0.8 mg/dL (ref 0.44–1.00)
GFR calc Af Amer: >60 mL/min (ref >60)
GFR calc non Af Amer: >60 mL/min (ref >60)
Glucose, Bld: 133 mg/dL — ABNORMAL HIGH (ref 70–99)
Potassium: 4.3 mmol/L (ref 3.5–5.1)
Sodium: 137 mmol/L (ref 135–145)

## 2019-07-12 LAB — ECHOCARDIOGRAM COMPLETE
Height: 62 in
Weight: 2687.99 oz

## 2019-07-12 LAB — SARS CORONAVIRUS 2 (TAT 6-24 HRS): SARS Coronavirus 2: NEGATIVE

## 2019-07-12 MED ORDER — MUSCLE RUB 10-15 % EX CREA
TOPICAL_CREAM | CUTANEOUS | Status: AC | PRN
  Administered 2019-07-12: via TOPICAL
  Filled 2019-07-12 (×2): qty 85

## 2019-07-12 MED ORDER — PANTOPRAZOLE SODIUM 40 MG PO TBEC
40.0000 mg | DELAYED_RELEASE_TABLET | Freq: Once | ORAL | Status: AC
  Administered 2019-07-12: 40 mg via ORAL
  Filled 2019-07-12: qty 1

## 2019-07-12 MED ORDER — CHLORHEXIDINE GLUCONATE 4 % EX LIQD
60.0000 mL | Freq: Once | CUTANEOUS | Status: AC
  Administered 2019-07-12: 4 via TOPICAL
  Filled 2019-07-12: qty 60

## 2019-07-12 NOTE — Consult Note (Signed)
Cardiology Consultation:   Patient ID: Carrie Barnes; [id number]; 04-Jul-1936   Admit date: 07/11/2019 Date of Consult: 07/12/2019  Primary Care Provider: Renford Dills, MD Primary Cardiologist: Verne Carrow, MD Primary Electrophysiologist:  None   Patient Profile:   Carrie Barnes is a 83 y.o. female with a PMH of CAD s/p PCI/DES to LAD in 03/2017, chronic combined CHF 2/2 ischemic cardiomyopathy with improvement in EF to 55-60% 04/2017, HTN, HLD, spinal stenosis, and osteoporosis who presented to [facility] ED after a fall, found to have a right hip fracture, and is being seen today for the evaluation of preop assessment at the request of Dr. Lequita Halt.  History of Present Illness:   Carrie Barnes was in her usual state of health until yesterday evening when she had a mechanical fall onto her right hip while hanging a shower curtain at her apartment. She recently moved to an senior living apartment and was using a step stool to hang a shower curtain when the stool slipped from underneath her and she fell on her right hip. She also hit her head but did not lose consciousness. No complaints of chest pain or palpitations surrounding the fall.  EMS was activated and she was noted to have right leg shortening. She was found to have a right hip fracture on imaging on arrival to [facility] ED.   She was last evaluated by cardiology at an outpatient visit with Dr. Clifton James 06/20/2019, at which time she was doing well from a cardiac standpoint. Her last ischemic evaluation was a LHC 03/2017 after presenting to [facility] with an MI. She had PIC/DES to LAD at that time. EF 03/2017 was 35-40%, which improved to 55-60% on echo 04/2017.   At the time of this evaluation she reports some hip pain but is otherwise doing fine. She was quite active prior to her fall and had been moving boxes and getting her apartment organized. She had no complaints of chest pain or SOB with these activities.  She could go up a flight of stairs, walk any distance on flat ground, perform household chores (cooking, light cleaning, vacuuming, and changing sheets), and ADLs without anginal complaints. No recent orthopnea, PND, LE edema, dizziness, lightheadedness, or syncope.   Hospital course: intermittently mildly hypertensive, otherwise VSS. Labs notable for electrolytes wnl, Cr 0.8, Hgb 12.2>10.1, PLT 102>145. COVID-19 negative. R hip XR with acute comminuted right intertrochanteric fracture. CXR with bibasilar scarring vs atelectasis with multiple calcified granulomas in the RLL. EKG with NSR, rate 82, QTc 446, no STE/D, no TWI. Ortho consulted with plans for surgical repair of hip fracture pending cardiology evaluation for preop assessment.     Past Medical History:  Diagnosis Date  . Cholecystitis   . Coronary artery disease    a. 03/2017: 80-90% LAD stenosis (PCI/DES placement with a 2.75x16 mm Promus Premier stent). No significant stenosis along RCA or LCx.   Marland Kitchen DJD (degenerative joint disease)   . Hyperlipidemia   . Hypertension    dx s/p MI   . Ischemic cardiomyopathy   . Myocardial infarction [facility]) 03/17/2017   03-2017 treated at [facility]   . Osteoporosis   . Status post insertion of drug-eluting stent into left anterior descending (LAD) artery 03/2017    Past Surgical History:  Procedure Laterality Date  . APPENDECTOMY    . CESAREAN SECTION    . CHOLECYSTECTOMY N/A 11/05/2017   Procedure: LAPAROSCOPIC CHOLECYSTECTOMY WITH INTRAOPERATIVE CHOLANGIOGRAM;  Surgeon: Darnell Level, MD;  Location: [facility] ORS;  Service: General;  Laterality: N/A;  . ERCP N/A 11/11/2017   Procedure: ENDOSCOPIC RETROGRADE CHOLANGIOPANCREATOGRAPHY (ERCP);  Surgeon: Willis Modena, MD;  Location: [facility];  Service: Endoscopy;  Laterality: N/A;  possible  . EUS N/A 11/11/2017   Procedure: UPPER ENDOSCOPIC ULTRASOUND (EUS) RADIAL;  Surgeon: Willis Modena, MD;  Location: [facility] ENDOSCOPY;  Service:  Endoscopy;  Laterality: N/A;  . stent  Left 03/2017   anterior descending ; drug eluting ; tx of MI at [facility]   . TONSILLECTOMY    . VAGINAL HYSTERECTOMY    . vaginal sling       Home Medications:  Prior to Admission medications   Medication Sig Start Date End Date Taking? Authorizing Provider  acetaminophen (TYLENOL) 500 MG tablet Take 500 mg by mouth daily as needed for moderate pain or headache.   Yes [provider]  alendronate (FOSAMAX) 70 MG tablet Take 70 mg by mouth once a week. Mondays   Yes [provider]  aspirin EC 81 MG tablet Take 81 mg by mouth daily.   Yes [provider]  atorvastatin (LIPITOR) 80 MG tablet TAKE 1 TABLET BY MOUTH  DAILY Patient taking differently: Take 80 mg by mouth daily.  05/27/19  Yes Kathleene Hazel, MD  calcium carbonate (OSCAL) 1500 (600 Ca) MG TABS tablet Take 600 mg of elemental calcium by mouth 2 (two) times daily with a meal.   Yes [provider]  cetirizine (ZYRTEC) 10 MG tablet Take 10 mg by mouth daily as needed for allergies.    Yes [provider]  Cholecalciferol (VITAMIN D3) 2000 units capsule Take 2,000 Units by mouth daily.   Yes [provider]  CRANBERRY EXTRACT PO Take 1 capsule by mouth daily.   Yes [provider]  losartan (COZAAR) 25 MG tablet Take 2 tablets (50 mg total) by mouth daily. 11/01/18  Yes Kathleene Hazel, MD  metoprolol succinate (TOPROL-XL) 50 MG 24 hr tablet TAKE 1 TABLET BY MOUTH  DAILY Patient taking differently: Take 50 mg by mouth daily.  05/27/19  Yes Kathleene Hazel, MD  Multiple Vitamin (MULTI-VITAMINS) TABS Take 1 tablet by mouth daily.   Yes [provider]  nitroGLYCERIN (NITROSTAT) 0.4 MG SL tablet Place 1 tablet (0.4 mg total) under the tongue every 5 (five) minutes as needed for chest pain. 03/31/17  Yes Dyann Kief, PA-C  Omega-3 Fatty Acids (FISH OIL) 1200 MG CAPS Take 1,200 mg by  mouth 2 (two) times daily.    Yes [provider]  omeprazole (PRILOSEC) 20 MG capsule Take 20 mg by mouth daily.   Yes [provider]  Polyethyl Glycol-Propyl Glycol (SYSTANE OP) Place 1 drop into both eyes daily as needed (dry eyes).   Yes [provider]  vitamin C (ASCORBIC ACID) 500 MG tablet Take 500 mg by mouth daily.   Yes [provider]    Inpatient Medications: Scheduled Meds: . atorvastatin  80 mg Oral Daily  . loratadine  10 mg Oral Daily  . metoprolol succinate  50 mg Oral Daily  . pantoprazole  40 mg Oral Daily   Continuous Infusions: . sodium chloride 125 mL/hr at 07/12/19 0701  . methocarbamol (ROBAXIN) IV     PRN Meds: HYDROcodone-acetaminophen, methocarbamol **OR** methocarbamol (ROBAXIN) IV, morphine injection, senna-docusate  Allergies:   No Known Allergies  Social History:   Social History   Socioeconomic History  . Marital status: Widowed  Spouse name: Not on file  . Number of children: 4  . Years of education: Not on file  . Highest education level: Not on file  Occupational History  . Occupation: Futures traderHomemaker  . 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 Financial resource strain: Not on file  . Food insecurity    Worry: Not on file    Inability: Not on file  . Transportation needs    Medical: Not on file    Non-medical: Not on file  Tobacco Use  . Smoking status: Former Smoker    Packs/day: 1.00    Years: 30.00    Pack years: 30.00    Types: Cigarettes  . Smokeless tobacco: Never Used  . Tobacco comment: quit 30 years   Substance and Sexual Activity  . Alcohol use: Yes    Comment: Social  . Drug use: Never  . Sexual activity: Not on file  Lifestyle  . Physical activity    Days per week: Not on file    Minutes per session: Not on file  . Stress: Not on file  Relationships  . Social Musicianconnections    Talks on phone: Not on file    Gets together: Not on file    Attends religious  service: Not on file    Active member of club or organization: Not on file    Attends meetings of clubs or organizations: Not on file    Relationship status: Not on file  . Intimate partner violence    Fear of current or ex partner: Not on file    Emotionally abused: Not on file    Physically abused: Not on file    Forced sexual activity: Not on file  Other Topics Concern  . Not on file  Social History Narrative  . Not on file    Family History:    Family History  Problem Relation Age of Onset  . Heart disease Father   . Breast cancer Sister   . Pancreatic cancer Sister      ROS:  Please see the history of present illness.  ROS  All other ROS reviewed and negative.     Physical Exam/Data:   Vitals:   07/11/19 2151 07/11/19 2230 07/12/19 0507 07/12/19 0851  BP: (!) 119/96 (!) 133/47 (!) 145/55 117/63  Pulse: 85 100 83 79  Resp: 15 16 16    Temp:  98.9 F (37.2 C) 98 F (36.7 C)   TempSrc:      SpO2: 100% 99% 99%   Weight:      Height:        Intake/Output Summary (Last 24 hours) at 07/12/2019 0922 Last data filed at 07/12/2019 0647 Gross per 24 hour  Intake 1104.21 ml  Output 150 ml  Net 954.21 ml   Filed Weights   07/11/19 1537  Weight: 76.2 kg   Body mass index is 30.73 kg/m.  General:  Well nourished, well developed, laying in bed in no acute distress HEENT: sclera anicteric  Neck: no JVD Vascular: No carotid bruits; distal pulses 2+ bilaterally Cardiac:  normal S1, S2; RRR; no murmurs, rubs, or gallops Lungs:  clear to auscultation bilaterally, no wheezing, rhonchi or rales  Abd: NABS, soft, nontender, no hepatomegaly Ext: no edema Musculoskeletal:  Right leg is externally rotated, BUE strength normal and equal Skin: warm and dry  Neuro:  CNs 2-12 intact, no focal abnormalities noted Psych:  Normal affect   EKG:  The EKG was personally reviewed and  demonstrates:  NSR, rate 82, QTc 446, no STE/D, no TWI Telemetry:  Telemetry was personally  reviewed and demonstrates:  NSR with occasional PVC  Relevant CV Studies: Echocardiogram 04/2017: Study Conclusions  - Left ventricle: The cavity size was normal. Wall thickness was   increased in a pattern of mild LVH. Systolic function was normal.   The estimated ejection fraction was in the range of 55% to 60%.   Wall motion was normal; there were no regional wall motion   abnormalities. Doppler parameters are consistent with abnormal   left ventricular relaxation (grade 1 diastolic dysfunction).   Doppler parameters are consistent with high ventricular filling   pressure. - Mitral valve: Calcified annulus. There was mild regurgitation. - Pericardium, extracardiac: A trivial pericardial effusion was   identified.  Impressions:  - Normal LV systolic function; mild diastolic dysfunction; elevated   LV filling pressure; mild LVH; mild MR; mild TR.  Left heart catheterization 03/2017: SELECTIVE CORONARY ARTERIOGRAPHY: A) The right coronary is dominant, with a PDA and a large trifurcating PLV branch. The RCA is free of significant disease.   B) The left main coronary arises above the left coronary cusp the aortic valve and trifurcates left anterior descending, ramus intermedius, and circumflex. The left main is normal in caliber and free of disease.   C) The left anterior descending coronary is a modest vessel which reaches the cardiac apex. There is a 30% proximal LAD stenosis. There is a single moderate bifurcating proximal diagonal, and distal to this diagonal, there is a hazy 80 to 90% LAD stenosis. The remainder of the LAD has minor plaquing but no obstructive disease.   C) The circumflex consists of a small ramus intermedius, a large bifurcating OM1, and a moderate PLV branch. The circumflex is free of disease.  LEFT VENTRICULOGRAPHY: The left ventricle is normal in size, with a large area of anteroapical hypokinesis. The estimated LVEF is 35-40%. There is no MR noted on  hand injection.  PERCUTANEOUS CORONARY INTERVENTION AND DRUG-ELUTING STENT IMPLANTATION OF THE MID LEFT ANTERIOR DESCENDING: Following diagnostic catheterization, a 6-French sheath was used to replace the 5-French sheath, and this was aspirated and flushed. Heparin was given to maintain an ACT of greater than 300 seconds. A 0.014 Intuition wire was placed into the diagonal, and a 0.014 Whisper MS wire placed into the distal LAD. The vessel was pre-dilated with a 2.5 mm balloon, and then a 2.75 x 16 mm Promus Premier stent was positioned cineangiographically and deployed at 14 atmospheres for 30 seconds. Afterwards, there was no residual stenosis in the LAD with TIMI 3 antegrade blood flow. Final arteriograms were performed, the guiding catheter removed over a J-wire. The patient was transported to the recovery area in stable condition.  CONCLUSIONS: 1. Moderately reduced left ventricular systolic function as noted above. 2. Elevated left ventricular end-diastolic pressure (LVEDP). 3. Significant single-vessel coronary artery disease involving the mid left anterior descending, with no significant disease in the left circumflex or right coronary artery. 4. Successful percutaneous coronary intervention and drug-eluting stent implantation of the mid left anterior descending, with no residual stenosis following treatment and TIMI 3 blood flow pre and postprocedurally.     Laboratory Data:  Chemistry Recent Labs  Lab 07/11/19 1604 07/12/19 0447  NA 139 137  K 4.3 4.3  CL 104 101  CO2 24 27  GLUCOSE 140* 133*  BUN 15 15  CREATININE 0.89 0.80  CALCIUM 9.2 8.7*  GFRNONAA >60 >60  GFRAA >60 >60  ANIONGAP 11 9    No results for input(s): PROT, ALBUMIN, AST, ALT, ALKPHOS, BILITOT in the last 168 hours. Hematology Recent Labs  Lab 07/11/19 2035 07/12/19 0447  WBC 12.6* 8.0  RBC 3.97 3.34*  HGB 12.2 10.1*  HCT 37.7 30.7*  MCV 95.0 91.9  MCH 30.7 30.2  MCHC 32.4 32.9  RDW 11.7 11.9   PLT 102* 145*   Cardiac EnzymesNo results for input(s): TROPONINI in the last 168 hours. No results for input(s): TROPIPOC in the last 168 hours.  BNPNo results for input(s): BNP, PROBNP in the last 168 hours.  DDimer No results for input(s): DDIMER in the last 168 hours.  Radiology/Studies:  Dg Chest 1 View  Result Date: 07/11/2019 CLINICAL DATA:  Preop. Pain status post fall. EXAM: CHEST  1 VIEW COMPARISON:  None. FINDINGS: There is no pneumothorax. No large pleural effusion. The heart size is mildly enlarged. There is scarring versus atelectasis at the lung bases. There appear to be multiple calcified granulomas in the right lower lobe. There is no acute osseous abnormality. Aortic calcifications are noted. IMPRESSION: 1. No pneumothorax or large pleural effusion. 2. Bibasilar scarring versus atelectasis. 3. Multiple calcified granulomas in the right lower lobe. Electronically Signed   By: Katherine Mantle M.D.   On: 07/11/2019 18:32   Ct Head Wo Contrast  Result Date: 07/11/2019 CLINICAL DATA:  Fall.  Head injury EXAM: CT HEAD WITHOUT CONTRAST CT CERVICAL SPINE WITHOUT CONTRAST TECHNIQUE: Multidetector CT imaging of the head and cervical spine was performed following the standard protocol without intravenous contrast. Multiplanar CT image reconstructions of the cervical spine were also generated. COMPARISON:  None. FINDINGS: CT HEAD FINDINGS Brain: Mild atrophy and mild chronic microvascular ischemic changes in the white matter. Negative for acute hemorrhage, infarct, or mass. No midline shift. Vascular: Negative for hyperdense vessel Skull: Negative for fracture.  Right frontal scalp hematoma. Sinuses/Orbits: Paranasal sinuses clear. Bilateral cataract surgery. Other: None CT CERVICAL SPINE FINDINGS Alignment: Mild anterolisthesis C6-7 otherwise normal alignment Skull base and vertebrae: Negative for fracture Soft tissues and spinal canal: Negative for soft tissue mass or swelling.  Atherosclerotic calcification in the carotid bifurcation bilaterally. Disc levels: Diffuse facet degeneration right greater than left. Disc degeneration and spurring most prominent at C6-7. Mild spinal stenosis at C6-7 due to spurring. Upper chest: Negative Other: 8 mm right thyroid nodule. No further imaging is required for this lesion. IMPRESSION: 1. Atrophy and chronic microvascular ischemic change in the white matter. No acute intracranial abnormality. Right frontal scalp hematoma 2. Negative for cervical spine fracture. Electronically Signed   By: Marlan Palau M.D.   On: 07/11/2019 17:00   Ct Cervical Spine Wo Contrast  Result Date: 07/11/2019 CLINICAL DATA:  Fall.  Head injury EXAM: CT HEAD WITHOUT CONTRAST CT CERVICAL SPINE WITHOUT CONTRAST TECHNIQUE: Multidetector CT imaging of the head and cervical spine was performed following the standard protocol without intravenous contrast. Multiplanar CT image reconstructions of the cervical spine were also generated. COMPARISON:  None. FINDINGS: CT HEAD FINDINGS Brain: Mild atrophy and mild chronic microvascular ischemic changes in the white matter. Negative for acute hemorrhage, infarct, or mass. No midline shift. Vascular: Negative for hyperdense vessel Skull: Negative for fracture.  Right frontal scalp hematoma. Sinuses/Orbits: Paranasal sinuses clear. Bilateral cataract surgery. Other: None CT CERVICAL SPINE FINDINGS Alignment: Mild anterolisthesis C6-7 otherwise normal alignment Skull base and vertebrae: Negative for fracture Soft tissues and spinal canal: Negative for soft tissue mass or swelling. Atherosclerotic calcification in the carotid bifurcation  bilaterally. Disc levels: Diffuse facet degeneration right greater than left. Disc degeneration and spurring most prominent at C6-7. Mild spinal stenosis at C6-7 due to spurring. Upper chest: Negative Other: 8 mm right thyroid nodule. No further imaging is required for this lesion. IMPRESSION: 1. Atrophy  and chronic microvascular ischemic change in the white matter. No acute intracranial abnormality. Right frontal scalp hematoma 2. Negative for cervical spine fracture. Electronically Signed   By: Franchot Gallo M.D.   On: 07/11/2019 17:00   Dg Hip Unilat With Pelvis 2-3 Views Right  Result Date: 07/11/2019 CLINICAL DATA:  Painful right hip after fall EXAM: DG HIP (WITH OR WITHOUT PELVIS) 2-3V RIGHT COMPARISON:  CT 03/02/2017 FINDINGS: SI joints are non widened. Acute comminuted right intertrochanteric fracture with displaced lesser trochanteric fracture fragment. Pubic symphysis appears intact. The femoral head is normally position. IMPRESSION: Acute comminuted right intertrochanteric fracture Electronically Signed   By: Donavan Foil M.D.   On: 07/11/2019 17:36    Assessment and Plan:   1. Preoperative assessment: patient has been doing well from a cardiac standpoint. She suffered a mechanical fall 07/11/2019 resulting in a right hip fracture. Prior to this she could easily complete 4 METs without anginal complaints.  - Based on ACC/AHA guidelines, the patient would be at acceptable risk for the planned procedure without further cardiovascular testing.   2. CAD s/p PCI/DES to LAD in 2018: no anginal complaints. Aspirin on hold for upcoming surgery  - Resume aspirin when cleared to do so by ortho - Continue atorvastatin and metoprolol   3. Chronic combined CHF/ischemic cardiomyopathy: EF 35-40% at time of MI 03/2017, with improvement in EF to 55-60% on echo 04/2017. No volume overload complaints and she appears euvolemic on exam.  - Continue metoprolol  3. HTN: BP overall stable - Continue metoprolol  4. HLD: LDL 37 in 2018 - Continue atorvastatin  5. Hip fracture: acute right hip fracture following a mechanical fall. Ortho planning to take to OR tomorrow for surgical repair - Continue management per ortho  - Continue pain control per primary team     For questions or updates, please  contact [place] Please consult [email or web address] for contact info under Cardiology/STEMI.   Signed, Abigail Butts, PA-C  07/12/2019 9:22 AM [phone]

## 2019-07-12 NOTE — Progress Notes (Signed)
PROGRESS NOTE    Carrie Barnes Pam  NOM:[id number] DOB: [birth year] DOA: 07/11/2019 PCP: Renford Dills, MD   Brief Narrative:  Carrie Barnes is a 83 y.o. female with medical history significant of CAD, ischemic cardiomyopathy, HTN, HLD and spinal stenosis , osteoporosis on Fosomax presented after a fall when she landed on her right side while hanging shower curtain.  Started having pain.  No loss of consciousness or trauma to the head.  She was brought into the ED.  Diagnosed with closed intertrochanteric fracture of the right hip.  Admitted to hospitalist service and orthopedics consulted.  She is scheduled to have surgery tomorrow.  Assessment & Plan:   Active Problems:   CAD in native artery   Ischemic cardiomyopathy   Hyperlipemia   Osteoporosis   Closed intertrochanteric fracture of right hip (HCC)   Closed right hip fracture (HCC)  Closed intertrochanteric fracture of the right hip: Pain fairly controlled.  Seen by orthopedics.  Scheduled for surgery tomorrow.  Pain management and DVT prophylaxis per orthopedics.  History of CAD/ischemic cardiomyopathy: No symptoms.  On aspirin, statin and beta-blocker.  Orthopedics has requested cardiology clearance.  Echo pending.  Osteoporosis: On Fosamax at home.  Hyperlipidemia: Resume statin.  Dysuria: UA has been ordered yesterday but not collected yet.  I have ordered another UA stat.  We will follow and treat accordingly.  DVT prophylaxis: SCD/DVT prophylaxis per orthopedics Code Status: Full code Family Communication: Daughter present at bedside. Plan of care discussed with patient and her daughter in length and they verbalized understanding and agreed with it. Disposition Plan: We will potentially require SNF after surgery.  Estimated body mass index is 30.73 kg/m as calculated from the following:   Height as of this encounter: 5\' 2"  (1.575 m).   Weight as of this encounter: 76.2 kg.      Nutritional  status:               Consultants:   Orthopedics  Procedures:   None  Antimicrobials:   None   Subjective: Seen and examined.  Daughter at the bedside.  She complains of right hip pain as well as some suprapubic pain and burning urination.  She tells me that she finished her course of ciprofloxacin for UTI on Sunday and felt well but started having burning urination last night again.  No fever.  Objective: Vitals:   07/11/19 2151 07/11/19 2230 07/12/19 0507 07/12/19 0851  BP: (!) 119/96 (!) 133/47 (!) 145/55 117/63  Pulse: 85 100 83 79  Resp: 15 16 16    Temp:  98.9 F (37.2 C) 98 F (36.7 C)   TempSrc:      SpO2: 100% 99% 99%   Weight:      Height:        Intake/Output Summary (Last 24 hours) at 07/12/2019 1024 Last data filed at 07/12/2019 0647 Gross per 24 hour  Intake 1104.21 ml  Output 150 ml  Net 954.21 ml   Filed Weights   07/11/19 1537  Weight: 76.2 kg    Examination:  General exam: Appears calm and comfortable  Respiratory system: Clear to auscultation. Respiratory effort normal. Cardiovascular system: S1 & S2 heard, RRR. No JVD, murmurs, rubs, gallops or clicks. No pedal edema. Gastrointestinal system: Abdomen is nondistended, soft and suprapubic tenderness. No organomegaly or masses felt. Normal bowel sounds heard. Central nervous system: Alert and oriented. No focal neurological deficits. Extremities: Right lower extremity shortened and externally rotated. Skin: No rashes, lesions or ulcers Psychiatry:  Judgement and insight appear normal. Mood & affect appropriate.    Data Reviewed: I have personally reviewed following labs and imaging studies  CBC: Recent Labs  Lab 07/11/19 2035 07/12/19 0447  WBC 12.6* 8.0  NEUTROABS 10.3*  --   HGB 12.2 10.1*  HCT 37.7 30.7*  MCV 95.0 91.9  PLT 102* 761*   Basic Metabolic Panel: Recent Labs  Lab 07/11/19 1604 07/12/19 0447  NA 139 137  K 4.3 4.3  CL 104 101  CO2 24 27  GLUCOSE 140*  133*  BUN 15 15  CREATININE 0.89 0.80  CALCIUM 9.2 8.7*   GFR: Estimated Creatinine Clearance: 52.7 mL/min (by C-G formula based on SCr of 0.8 mg/dL). Liver Function Tests: No results for input(s): AST, ALT, ALKPHOS, BILITOT, PROT, ALBUMIN in the last 168 hours. No results for input(s): LIPASE, AMYLASE in the last 168 hours. No results for input(s): AMMONIA in the last 168 hours. Coagulation Profile: Recent Labs  Lab 07/11/19 1604  INR 1.0   Cardiac Enzymes: No results for input(s): CKTOTAL, CKMB, CKMBINDEX, TROPONINI in the last 168 hours. BNP (last 3 results) No results for input(s): PROBNP in the last 8760 hours. HbA1C: No results for input(s): HGBA1C in the last 72 hours. CBG: No results for input(s): GLUCAP in the last 168 hours. Lipid Profile: No results for input(s): CHOL, HDL, LDLCALC, TRIG, CHOLHDL, LDLDIRECT in the last 72 hours. Thyroid Function Tests: No results for input(s): TSH, T4TOTAL, FREET4, T3FREE, THYROIDAB in the last 72 hours. Anemia Panel: No results for input(s): VITAMINB12, FOLATE, FERRITIN, TIBC, IRON, RETICCTPCT in the last 72 hours. Sepsis Labs: No results for input(s): PROCALCITON, LATICACIDVEN in the last 168 hours.  Recent Results (from the past 240 hour(s))  SARS CORONAVIRUS 2 (TAT 6-24 HRS) Nasopharyngeal Nasopharyngeal Swab     Status: None   Collection Time: 07/11/19  7:03 PM   Specimen: Nasopharyngeal Swab  Result Value Ref Range Status   SARS Coronavirus 2 NEGATIVE NEGATIVE Final    Comment: (NOTE) SARS-CoV-2 target nucleic acids are NOT DETECTED. The SARS-CoV-2 RNA is generally detectable in upper and lower respiratory specimens during the acute phase of infection. Negative results do not preclude SARS-CoV-2 infection, do not rule out co-infections with other pathogens, and should not be used as the sole basis for treatment or other patient management decisions. Negative results must be combined with clinical observations, patient  history, and epidemiological information. The expected result is Negative. Fact Sheet for Patients: SugarRoll.be Fact Sheet for Healthcare Providers: https://www.woods-mathews.com/ This test is not yet approved or cleared by the [place] FDA and  has been authorized for detection and/or diagnosis of SARS-CoV-2 by FDA under an Emergency Use Authorization (EUA). This EUA will remain  in effect (meaning this test can be used) for the duration of the COVID-19 declaration under Section 56 4(b)(1) of the Act, 21 U.S.C. section 360bbb-3(b)(1), unless the authorization is terminated or revoked sooner. Performed at [facility], [place] [address], [place], [place] [address]       Radiology Studies: Dg Chest 1 View  Result Date: 07/11/2019 CLINICAL DATA:  Preop. Pain status post fall. EXAM: CHEST  1 VIEW COMPARISON:  None. FINDINGS: There is no pneumothorax. No large pleural effusion. The heart size is mildly enlarged. There is scarring versus atelectasis at the lung bases. There appear to be multiple calcified granulomas in the right lower lobe. There is no acute osseous abnormality. Aortic calcifications are noted. IMPRESSION: 1. No pneumothorax or large pleural  effusion. 2. Bibasilar scarring versus atelectasis. 3. Multiple calcified granulomas in the right lower lobe. Electronically Signed   By: Katherine Mantle M.D.   On: 07/11/2019 18:32   Ct Head Wo Contrast  Result Date: 07/11/2019 CLINICAL DATA:  Fall.  Head injury EXAM: CT HEAD WITHOUT CONTRAST CT CERVICAL SPINE WITHOUT CONTRAST TECHNIQUE: Multidetector CT imaging of the head and cervical spine was performed following the standard protocol without intravenous contrast. Multiplanar CT image reconstructions of the cervical spine were also generated. COMPARISON:  None. FINDINGS: CT HEAD FINDINGS Brain: Mild atrophy and mild chronic microvascular ischemic changes in the white matter. Negative  for acute hemorrhage, infarct, or mass. No midline shift. Vascular: Negative for hyperdense vessel Skull: Negative for fracture.  Right frontal scalp hematoma. Sinuses/Orbits: Paranasal sinuses clear. Bilateral cataract surgery. Other: None CT CERVICAL SPINE FINDINGS Alignment: Mild anterolisthesis C6-7 otherwise normal alignment Skull base and vertebrae: Negative for fracture Soft tissues and spinal canal: Negative for soft tissue mass or swelling. Atherosclerotic calcification in the carotid bifurcation bilaterally. Disc levels: Diffuse facet degeneration right greater than left. Disc degeneration and spurring most prominent at C6-7. Mild spinal stenosis at C6-7 due to spurring. Upper chest: Negative Other: 8 mm right thyroid nodule. No further imaging is required for this lesion. IMPRESSION: 1. Atrophy and chronic microvascular ischemic change in the white matter. No acute intracranial abnormality. Right frontal scalp hematoma 2. Negative for cervical spine fracture. Electronically Signed   By: Marlan Palau M.D.   On: 07/11/2019 17:00   Ct Cervical Spine Wo Contrast  Result Date: 07/11/2019 CLINICAL DATA:  Fall.  Head injury EXAM: CT HEAD WITHOUT CONTRAST CT CERVICAL SPINE WITHOUT CONTRAST TECHNIQUE: Multidetector CT imaging of the head and cervical spine was performed following the standard protocol without intravenous contrast. Multiplanar CT image reconstructions of the cervical spine were also generated. COMPARISON:  None. FINDINGS: CT HEAD FINDINGS Brain: Mild atrophy and mild chronic microvascular ischemic changes in the white matter. Negative for acute hemorrhage, infarct, or mass. No midline shift. Vascular: Negative for hyperdense vessel Skull: Negative for fracture.  Right frontal scalp hematoma. Sinuses/Orbits: Paranasal sinuses clear. Bilateral cataract surgery. Other: None CT CERVICAL SPINE FINDINGS Alignment: Mild anterolisthesis C6-7 otherwise normal alignment Skull base and vertebrae:  Negative for fracture Soft tissues and spinal canal: Negative for soft tissue mass or swelling. Atherosclerotic calcification in the carotid bifurcation bilaterally. Disc levels: Diffuse facet degeneration right greater than left. Disc degeneration and spurring most prominent at C6-7. Mild spinal stenosis at C6-7 due to spurring. Upper chest: Negative Other: 8 mm right thyroid nodule. No further imaging is required for this lesion. IMPRESSION: 1. Atrophy and chronic microvascular ischemic change in the white matter. No acute intracranial abnormality. Right frontal scalp hematoma 2. Negative for cervical spine fracture. Electronically Signed   By: Marlan Palau M.D.   On: 07/11/2019 17:00   Dg Hip Unilat With Pelvis 2-3 Views Right  Result Date: 07/11/2019 CLINICAL DATA:  Painful right hip after fall EXAM: DG HIP (WITH OR WITHOUT PELVIS) 2-3V RIGHT COMPARISON:  CT 03/02/2017 FINDINGS: SI joints are non widened. Acute comminuted right intertrochanteric fracture with displaced lesser trochanteric fracture fragment. Pubic symphysis appears intact. The femoral head is normally position. IMPRESSION: Acute comminuted right intertrochanteric fracture Electronically Signed   By: Jasmine Pang M.D.   On: 07/11/2019 17:36    Scheduled Meds:  atorvastatin  80 mg Oral Daily   loratadine  10 mg Oral Daily   metoprolol succinate  50 mg Oral  Daily   pantoprazole  40 mg Oral Daily   Continuous Infusions:  sodium chloride 125 mL/hr at 07/12/19 0701   methocarbamol (ROBAXIN) IV       LOS: 1 day   Time spent: 31 minutes   Hughie Clossavi Maura Braaten, MD [facility]  07/12/2019, 10:24 AM   To contact the attending provider between 7A-7P or the covering provider during after hours 7P-7A, please log into the web site [email or web address] and use password [email or web address].

## 2019-07-12 NOTE — Progress Notes (Signed)
   Subjective:   Patient doing well this AM, resting comfortably in bed. Reports pain as mild. Daughter is present with pt. States she is very happy Dr. Wynelle Link will be doing her surgery. Denies chest pain or SOB.   Objective: Vital signs in last 24 hours: Temp:  [98 F (36.7 C)-98.9 F (37.2 C)] 98 F (36.7 C) (12/01 0507) Pulse Rate:  [74-100] 83 (12/01 0507) Resp:  [15-18] 16 (12/01 0507) BP: (113-147)/(47-96) 145/55 (12/01 0507) SpO2:  [92 %-100 %] 99 % (12/01 0507) Weight:  [76.2 kg] 76.2 kg (11/30 1537)  Intake/Output from previous day:  Intake/Output Summary (Last 24 hours) at 07/12/2019 0824 Last data filed at 07/12/2019 0647 Gross per 24 hour  Intake 1104.21 ml  Output 150 ml  Net 954.21 ml    Labs: Recent Labs    07/11/19 2035 07/12/19 0447  HGB 12.2 10.1*   Recent Labs    07/11/19 2035 07/12/19 0447  WBC 12.6* 8.0  RBC 3.97 3.34*  HCT 37.7 30.7*  PLT 102* 145*   Recent Labs    07/11/19 1604 07/12/19 0447  NA 139 137  K 4.3 4.3  CL 104 101  CO2 24 27  BUN 15 15  CREATININE 0.89 0.80  GLUCOSE 140* 133*  CALCIUM 9.2 8.7*   Recent Labs    07/11/19 1604  INR 1.0    Exam: General - Patient is Alert and Oriented Extremity - Neurovascular intact Sensation intact distally Intact pulses distally Dorsiflexion/Plantar flexion intact  Right lower extremity shortened and externally rotated. Motor Function - intact, moving foot and toes well on exam.   Past Medical History:  Diagnosis Date  . Cholecystitis   . Coronary artery disease    a. 03/2017: 80-90% LAD stenosis (PCI/DES placement with a 2.75x16 mm Promus Premier stent). No significant stenosis along RCA or LCx.   Marland Kitchen DJD (degenerative joint disease)   . Hyperlipidemia   . Hypertension    dx s/p MI   . Ischemic cardiomyopathy   . Myocardial infarction [facility]) 03/17/2017   03-2017 treated at [facility]   . Osteoporosis   . Status post insertion of drug-eluting stent into left  anterior descending (LAD) artery 03/2017    Assessment/Plan:    Active Problems:   CAD in native artery   Ischemic cardiomyopathy   Hyperlipemia   Osteoporosis   Closed intertrochanteric fracture of right hip (HCC)   Closed right hip fracture (HCC)  Estimated body mass index is 30.73 kg/m as calculated from the following:   Height as of this encounter: 5\' 2"  (1.575 m).   Weight as of this encounter: 76.2 kg.  Non-weight bearing to the RLE.  Planning for right femoral IM nail placement tomorrow afternoon, consent placed in chart. NPO at midnight, placed on cardiac diet for today. Awaiting cardiac clearance prior to OR. Discussed surgery and post-operative plan in detail with the patient and daughter, all questions were welcomed and addressed.   Theresa Duty, PA-C Orthopedic Surgery 07/12/2019, 8:24 AM

## 2019-07-12 NOTE — Progress Notes (Signed)
  Echocardiogram 2D Echocardiogram has been performed.  Carrie Barnes 07/12/2019, 1:40 PM

## 2019-07-12 NOTE — TOC Initial Note (Signed)
Transition of Care [facility]) - Initial/Assessment Note    Patient Details  Name: Carrie Barnes MRN: [id number] Date of Birth: [birth year]  [facility] CM/SW Contact:    Lanier Clam, RN Phone Number: 07/12/2019, 3:31 PM  Clinical Narrative:  CAD, IM nail R hip in am. Await post surgery & PT eval to assess for SNF.                 Expected Discharge Plan: Skilled Nursing Facility Barriers to Discharge: Continued Medical Work up   Patient Goals and CMS Choice        Expected Discharge Plan and Services Expected Discharge Plan: Skilled Nursing Facility       Living arrangements for the past 2 months: Single Family Home                                      Prior Living Arrangements/Services Living arrangements for the past 2 months: Single Family Home Lives with:: Adult Children Patient language and need for interpreter reviewed:: Yes Do you feel safe going back to the place where you live?: Yes      Need for Family Participation in Patient Care: No (Comment) Care giver support system in place?: Yes (comment)   Criminal Activity/Legal Involvement Pertinent to Current Situation/Hospitalization: No - Comment as needed  Activities of Daily Living Home Assistive Devices/Equipment: None ADL Screening (condition at time of admission) Patient's cognitive ability adequate to safely complete daily activities?: Yes Is the patient deaf or have difficulty hearing?: No Does the patient have difficulty seeing, even when wearing glasses/contacts?: No Does the patient have difficulty concentrating, remembering, or making decisions?: No Patient able to express need for assistance with ADLs?: Yes Does the patient have difficulty dressing or bathing?: Yes Independently performs ADLs?: No Communication: Independent Dressing (OT): Needs assistance Is this a change from baseline?: Change from baseline, expected to last <3days Grooming: Needs assistance Is  this a change from baseline?: Change from baseline, expected to last <3 days Feeding: Needs assistance Is this a change from baseline?: Change from baseline, expected to last <3 days Bathing: Needs assistance Is this a change from baseline?: Change from baseline, expected to last <3 days Toileting: Needs assistance Is this a change from baseline?: Pre-admission baseline In/Out Bed: Needs assistance Is this a change from baseline?: Pre-admission baseline Walks in Home: Needs assistance Is this a change from baseline?: Pre-admission baseline Does the patient have difficulty walking or climbing stairs?: Yes Weakness of Legs: Right Weakness of Arms/Hands: Left  Permission Sought/Granted Permission sought to share information with : Case Manager Permission granted to share information with : Yes, Verbal Permission Granted  Share Information with NAME: dtr renate tate [phone]           Emotional Assessment Appearance:: Appears stated age Attitude/Demeanor/Rapport: Gracious Affect (typically observed): Accepting Orientation: : Oriented to Self, Oriented to Place, Oriented to  Time Alcohol / Substance Use: Not Applicable Psych Involvement: No (comment)  Admission diagnosis:  Fall [W19.XXXA] Fall, initial encounter [id number]] Closed displaced intertrochanteric fracture of right femur, initial encounter [facility]) [S72.141A] Patient Active Problem List   Diagnosis Date Noted  . Closed intertrochanteric fracture of right hip (HCC) 07/11/2019  . Closed right hip fracture (HCC) 07/11/2019  . Chronic cholecystitis due to cholelithiasis with choledocholithiasis 11/05/2017  . Cholelithiasis with chronic cholecystitis 11/01/2017  . Cholelithiasis 05/18/2017  . CAD in native  artery 03/31/2017  . Ischemic cardiomyopathy 03/31/2017  . Hyperlipemia 03/31/2017  . DJD (degenerative joint disease) 03/31/2017  . Osteoporosis 03/31/2017   PCP:  Seward Carol, MD Pharmacy:   [facility] [place], [place] [address] [place] [address] Phone: [phone] Fax: [phone]  [place], [facility] [address] [place] [address] [place] [address] Phone: [phone] Fax: [phone]     Social Determinants of Health (SDOH) Interventions    Readmission Risk Interventions No flowsheet data found.

## 2019-07-12 NOTE — Plan of Care (Signed)
Lab results improving.

## 2019-07-13 ENCOUNTER — Encounter (HOSPITAL_COMMUNITY)
Admission: EM | Disposition: A | Discharge status: Home Health | Payer: Self-pay | Source: Home / Self Care | Attending: Internal Medicine

## 2019-07-13 ENCOUNTER — Inpatient Hospital Stay (HOSPITAL_COMMUNITY): Payer: Medicare Other | Admitting: Certified Registered Nurse Anesthetist

## 2019-07-13 ENCOUNTER — Inpatient Hospital Stay (HOSPITAL_COMMUNITY): Payer: Medicare Other

## 2019-07-13 HISTORY — PX: FEMUR IM NAIL: SHX1597

## 2019-07-13 SURGERY — INSERTION, INTRAMEDULLARY ROD, FEMUR, RETROGRADE
Anesthesia: General | Laterality: Right

## 2019-07-13 MED ORDER — OXYCODONE HCL 5 MG PO TABS: 5.0000 mg | ORAL_TABLET | Freq: Once | ORAL | Status: DC | PRN

## 2019-07-13 MED ORDER — ONDANSETRON HCL 4 MG/2ML IJ SOLN
INTRAMUSCULAR | Status: DC | PRN
  Administered 2019-07-13: 4 mg via INTRAVENOUS

## 2019-07-13 MED ORDER — FENTANYL CITRATE (PF) 100 MCG/2ML IJ SOLN
INTRAMUSCULAR | Status: DC | PRN
  Administered 2019-07-13: 50 ug via INTRAVENOUS
  Administered 2019-07-13: 25 ug via INTRAVENOUS
  Administered 2019-07-13: 50 ug via INTRAVENOUS
  Administered 2019-07-13: 25 ug via INTRAVENOUS

## 2019-07-13 MED ORDER — PROMETHAZINE HCL 25 MG/ML IJ SOLN: 6.2500 mg | INTRAMUSCULAR | Status: DC | PRN

## 2019-07-13 MED ORDER — POVIDONE-IODINE 10 % EX SWAB: 2.0000 "application " | Freq: Once | CUTANEOUS | Status: DC

## 2019-07-13 MED ORDER — HYDROMORPHONE HCL 1 MG/ML IJ SOLN: 0.2500 mg | INTRAMUSCULAR | Status: DC | PRN

## 2019-07-13 MED ORDER — OXYCODONE HCL 5 MG/5ML PO SOLN: 5.0000 mg | Freq: Once | ORAL | Status: DC | PRN

## 2019-07-13 MED ORDER — DEXAMETHASONE SODIUM PHOSPHATE 10 MG/ML IJ SOLN
8.0000 mg | Freq: Once | INTRAMUSCULAR | Status: AC
  Administered 2019-07-13: 8 mg via INTRAVENOUS

## 2019-07-13 MED ORDER — PHENOL 1.4 % MT LIQD
1.0000 | OROMUCOSAL | Status: DC | PRN
  Filled 2019-07-13: qty 177

## 2019-07-13 MED ORDER — ONDANSETRON HCL 4 MG/2ML IJ SOLN
INTRAMUSCULAR | Status: AC
  Filled 2019-07-13: qty 2

## 2019-07-13 MED ORDER — ALBUTEROL SULFATE HFA 108 (90 BASE) MCG/ACT IN AERS
INHALATION_SPRAY | RESPIRATORY_TRACT | Status: AC
  Filled 2019-07-13: qty 6.7

## 2019-07-13 MED ORDER — PROPOFOL 10 MG/ML IV BOLUS
INTRAVENOUS | Status: DC | PRN
  Administered 2019-07-13: 130 mg via INTRAVENOUS

## 2019-07-13 MED ORDER — FENTANYL CITRATE (PF) 100 MCG/2ML IJ SOLN
INTRAMUSCULAR | Status: AC
  Filled 2019-07-13: qty 2

## 2019-07-13 MED ORDER — METHOCARBAMOL 500 MG IVPB - SIMPLE MED
500.0000 mg | Freq: Four times a day (QID) | INTRAVENOUS | Status: DC | PRN
  Filled 2019-07-13: qty 50

## 2019-07-13 MED ORDER — EPHEDRINE 5 MG/ML INJ
INTRAVENOUS | Status: AC
  Filled 2019-07-13: qty 10

## 2019-07-13 MED ORDER — METOCLOPRAMIDE HCL 5 MG PO TABS: 5.0000 mg | ORAL_TABLET | Freq: Three times a day (TID) | ORAL | Status: DC | PRN

## 2019-07-13 MED ORDER — ROCURONIUM BROMIDE 50 MG/5ML IV SOSY
PREFILLED_SYRINGE | INTRAVENOUS | Status: DC | PRN
  Administered 2019-07-13: 80 mg via INTRAVENOUS

## 2019-07-13 MED ORDER — LIDOCAINE 2% (20 MG/ML) 5 ML SYRINGE
INTRAMUSCULAR | Status: DC | PRN
  Administered 2019-07-13: 40 mg via INTRAVENOUS

## 2019-07-13 MED ORDER — SUGAMMADEX SODIUM 500 MG/5ML IV SOLN
INTRAVENOUS | Status: AC
  Filled 2019-07-13: qty 5

## 2019-07-13 MED ORDER — SUGAMMADEX SODIUM 500 MG/5ML IV SOLN
INTRAVENOUS | Status: DC | PRN
  Administered 2019-07-13: 300 mg via INTRAVENOUS

## 2019-07-13 MED ORDER — DOCUSATE SODIUM 100 MG PO CAPS
100.0000 mg | ORAL_CAPSULE | Freq: Two times a day (BID) | ORAL | Status: DC
  Administered 2019-07-13 – 2019-07-18 (×8): 100 mg via ORAL
  Filled 2019-07-13 (×9): qty 1

## 2019-07-13 MED ORDER — ALBUMIN HUMAN 5 % IV SOLN
INTRAVENOUS | Status: AC
  Filled 2019-07-13: qty 250

## 2019-07-13 MED ORDER — LACTATED RINGERS IV SOLN: INTRAVENOUS | Status: DC

## 2019-07-13 MED ORDER — SODIUM CHLORIDE 0.9 % IR SOLN
Status: DC | PRN
  Administered 2019-07-13: 1000 mL

## 2019-07-13 MED ORDER — LACTATED RINGERS IV SOLN
INTRAVENOUS | Status: DC
  Administered 2019-07-13: 15:00:00 via INTRAVENOUS

## 2019-07-13 MED ORDER — ROCURONIUM BROMIDE 10 MG/ML (PF) SYRINGE
PREFILLED_SYRINGE | INTRAVENOUS | Status: AC
  Filled 2019-07-13: qty 10

## 2019-07-13 MED ORDER — DEXAMETHASONE SODIUM PHOSPHATE 10 MG/ML IJ SOLN
INTRAMUSCULAR | Status: AC
  Filled 2019-07-13: qty 1

## 2019-07-13 MED ORDER — PROPOFOL 10 MG/ML IV BOLUS
INTRAVENOUS | Status: AC
  Filled 2019-07-13: qty 20

## 2019-07-13 MED ORDER — BUPIVACAINE HCL (PF) 0.25 % IJ SOLN
INTRAMUSCULAR | Status: AC
  Filled 2019-07-13: qty 30

## 2019-07-13 MED ORDER — LIDOCAINE 2% (20 MG/ML) 5 ML SYRINGE
INTRAMUSCULAR | Status: AC
  Filled 2019-07-13: qty 5

## 2019-07-13 MED ORDER — ESMOLOL HCL 100 MG/10ML IV SOLN
INTRAVENOUS | Status: AC
  Filled 2019-07-13: qty 10

## 2019-07-13 MED ORDER — CEFAZOLIN SODIUM-DEXTROSE 2-4 GM/100ML-% IV SOLN
2.0000 g | INTRAVENOUS | Status: AC
  Administered 2019-07-13: 2 g via INTRAVENOUS

## 2019-07-13 MED ORDER — CEFAZOLIN SODIUM-DEXTROSE 1-4 GM/50ML-% IV SOLN
1.0000 g | Freq: Four times a day (QID) | INTRAVENOUS | Status: AC
  Administered 2019-07-13 – 2019-07-14 (×2): 1 g via INTRAVENOUS
  Filled 2019-07-13 (×2): qty 50

## 2019-07-13 MED ORDER — ONDANSETRON HCL 4 MG PO TABS: 4.0000 mg | ORAL_TABLET | Freq: Four times a day (QID) | ORAL | Status: DC | PRN

## 2019-07-13 MED ORDER — ACETAMINOPHEN 10 MG/ML IV SOLN
1000.0000 mg | Freq: Four times a day (QID) | INTRAVENOUS | Status: DC
  Administered 2019-07-13: 1000 mg via INTRAVENOUS
  Filled 2019-07-13: qty 100

## 2019-07-13 MED ORDER — CEFAZOLIN SODIUM-DEXTROSE 2-4 GM/100ML-% IV SOLN: 2.0000 g | Freq: Once | INTRAVENOUS | Status: DC

## 2019-07-13 MED ORDER — ENOXAPARIN SODIUM 40 MG/0.4ML ~~LOC~~ SOLN
40.0000 mg | SUBCUTANEOUS | Status: DC
  Administered 2019-07-14 – 2019-07-18 (×5): 40 mg via SUBCUTANEOUS
  Filled 2019-07-13 (×5): qty 0.4

## 2019-07-13 MED ORDER — METHOCARBAMOL 500 MG PO TABS
500.0000 mg | ORAL_TABLET | Freq: Four times a day (QID) | ORAL | Status: DC | PRN
  Administered 2019-07-14 – 2019-07-18 (×4): 500 mg via ORAL
  Filled 2019-07-13 (×3): qty 1

## 2019-07-13 MED ORDER — CEFAZOLIN SODIUM-DEXTROSE 2-4 GM/100ML-% IV SOLN
INTRAVENOUS | Status: AC
  Filled 2019-07-13: qty 100

## 2019-07-13 MED ORDER — PHENYLEPHRINE 40 MCG/ML (10ML) SYRINGE FOR IV PUSH (FOR BLOOD PRESSURE SUPPORT)
PREFILLED_SYRINGE | INTRAVENOUS | Status: DC | PRN
  Administered 2019-07-13: 120 ug via INTRAVENOUS
  Administered 2019-07-13 (×2): 80 ug via INTRAVENOUS
  Administered 2019-07-13: 120 ug via INTRAVENOUS

## 2019-07-13 MED ORDER — ONDANSETRON HCL 4 MG/2ML IJ SOLN
4.0000 mg | Freq: Four times a day (QID) | INTRAMUSCULAR | Status: DC | PRN
  Administered 2019-07-15: 4 mg via INTRAVENOUS
  Filled 2019-07-13: qty 2

## 2019-07-13 MED ORDER — MENTHOL 3 MG MT LOZG: 1.0000 | LOZENGE | OROMUCOSAL | Status: DC | PRN

## 2019-07-13 MED ORDER — ALBUMIN HUMAN 5 % IV SOLN
INTRAVENOUS | Status: DC | PRN
  Administered 2019-07-13: 16:00:00 via INTRAVENOUS

## 2019-07-13 MED ORDER — ESMOLOL HCL 100 MG/10ML IV SOLN
INTRAVENOUS | Status: DC | PRN
  Administered 2019-07-13 (×3): 10 mg via INTRAVENOUS

## 2019-07-13 MED ORDER — METOCLOPRAMIDE HCL 5 MG/ML IJ SOLN: 5.0000 mg | Freq: Three times a day (TID) | INTRAMUSCULAR | Status: DC | PRN

## 2019-07-13 MED ORDER — PHENYLEPHRINE HCL (PRESSORS) 10 MG/ML IV SOLN
INTRAVENOUS | Status: AC
  Filled 2019-07-13: qty 2

## 2019-07-13 MED ORDER — PHENYLEPHRINE HCL-NACL 10-0.9 MG/250ML-% IV SOLN
INTRAVENOUS | Status: DC | PRN
  Administered 2019-07-13: 40 ug/min via INTRAVENOUS

## 2019-07-13 SURGICAL SUPPLY — 60 items
BAG SPEC THK2 15X12 ZIP CLS (MISCELLANEOUS) ×1
BAG ZIPLOCK 12X15 (MISCELLANEOUS) ×3 IMPLANT
BANDAGE ESMARK 6X9 LF (GAUZE/BANDAGES/DRESSINGS) ×1 IMPLANT
BIT DRILL CANN LG 4.3MM (BIT) IMPLANT
BNDG CMPR 9X6 STRL LF SNTH (GAUZE/BANDAGES/DRESSINGS)
BNDG ELASTIC 6X5.8 VLCR STR LF (GAUZE/BANDAGES/DRESSINGS) ×1 IMPLANT
BNDG ESMARK 6X9 LF (GAUZE/BANDAGES/DRESSINGS)
BNDG GAUZE ELAST 4 BULKY (GAUZE/BANDAGES/DRESSINGS) ×2 IMPLANT
CLOSURE WOUND 1/2 X4 (GAUZE/BANDAGES/DRESSINGS) ×1
COVER SURGICAL LIGHT HANDLE (MISCELLANEOUS) ×3 IMPLANT
COVER WAND RF STERILE (DRAPES) IMPLANT
DRAPE C-ARM 42X120 X-RAY (DRAPES) ×3 IMPLANT
DRAPE ORTHO SPLIT 77X108 STRL (DRAPES) ×6
DRAPE POUCH INSTRU U-SHP 10X18 (DRAPES) ×1 IMPLANT
DRAPE SHEET LG 3/4 BI-LAMINATE (DRAPES) IMPLANT
DRAPE SURG ORHT 6 SPLT 77X108 (DRAPES) ×2 IMPLANT
DRAPE U-SHAPE 47X51 STRL (DRAPES) ×5 IMPLANT
DRILL BIT CANN LG 4.3MM (BIT) ×3
DRSG ADAPTIC 3X8 NADH LF (GAUZE/BANDAGES/DRESSINGS) ×3 IMPLANT
DRSG MEPILEX BORDER 4X4 (GAUZE/BANDAGES/DRESSINGS) ×7 IMPLANT
DRSG MEPILEX BORDER 4X8 (GAUZE/BANDAGES/DRESSINGS) ×3 IMPLANT
DRSG PAD ABDOMINAL 8X10 ST (GAUZE/BANDAGES/DRESSINGS) ×3 IMPLANT
DURAPREP 26ML APPLICATOR (WOUND CARE) ×5 IMPLANT
ELECT REM PT RETURN 15FT ADLT (MISCELLANEOUS) ×2 IMPLANT
EVACUATOR 1/8 PVC DRAIN (DRAIN) ×1 IMPLANT
GAUZE SPONGE 4X4 12PLY STRL (GAUZE/BANDAGES/DRESSINGS) ×1 IMPLANT
GLOVE BIO SURGEON STRL SZ7.5 (GLOVE) ×3 IMPLANT
GLOVE BIO SURGEON STRL SZ8 (GLOVE) ×3 IMPLANT
GLOVE BIOGEL PI IND STRL 7.0 (GLOVE) ×1 IMPLANT
GLOVE BIOGEL PI IND STRL 8 (GLOVE) ×3 IMPLANT
GLOVE BIOGEL PI INDICATOR 7.0 (GLOVE) ×2
GLOVE BIOGEL PI INDICATOR 8 (GLOVE) ×6
GLOVE SURG SS PI 7.0 STRL IVOR (GLOVE) ×3 IMPLANT
GOWN STRL REUS W/TWL LRG LVL3 (GOWN DISPOSABLE) ×9 IMPLANT
GUIDEPIN 3.2X17.5 THRD DISP (PIN) ×2 IMPLANT
HIP FRA NAIL LAG SCREW 10.5X90 (Orthopedic Implant) ×3 IMPLANT
IMMOBILIZER KNEE 20 (SOFTGOODS) ×3 IMPLANT
IMMOBILIZER KNEE 20 THIGH 36 (SOFTGOODS) ×1 IMPLANT
KIT BASIN OR (CUSTOM PROCEDURE TRAY) ×3 IMPLANT
KIT TURNOVER KIT A (KITS) IMPLANT
MANIFOLD NEPTUNE II (INSTRUMENTS) ×3 IMPLANT
NAIL HIP FRACT 130D 11X180 (Screw) ×2 IMPLANT
NS IRRIG 1000ML POUR BTL (IV SOLUTION) ×3 IMPLANT
PACK TOTAL JOINT (CUSTOM PROCEDURE TRAY) ×3 IMPLANT
PADDING CAST COTTON 6X4 STRL (CAST SUPPLIES) ×3 IMPLANT
PENCIL SMOKE EVACUATOR (MISCELLANEOUS) IMPLANT
PROTECTOR NERVE ULNAR (MISCELLANEOUS) ×5 IMPLANT
SCREW BONE CORTICAL 5.0X3 (Screw) ×2 IMPLANT
SCREW LAG HIP FRA NAIL 10.5X90 (Orthopedic Implant) IMPLANT
STAPLER VISISTAT 35W (STAPLE) ×3 IMPLANT
STRIP CLOSURE SKIN 1/2X4 (GAUZE/BANDAGES/DRESSINGS) ×2 IMPLANT
SUT MNCRL AB 4-0 PS2 18 (SUTURE) ×3 IMPLANT
SUT VIC AB 0 CT1 27 (SUTURE) ×3
SUT VIC AB 0 CT1 27XBRD ANTBC (SUTURE) ×1 IMPLANT
SUT VIC AB 2-0 CT1 27 (SUTURE) ×9
SUT VIC AB 2-0 CT1 TAPERPNT 27 (SUTURE) ×2 IMPLANT
SUT VLOC 180 0 24IN GS25 (SUTURE) ×1 IMPLANT
TOWEL OR 17X26 10 PK STRL BLUE (TOWEL DISPOSABLE) ×6 IMPLANT
TRAY FOLEY MTR SLVR 16FR STAT (SET/KITS/TRAYS/PACK) IMPLANT
WATER STERILE IRR 1000ML POUR (IV SOLUTION) ×1 IMPLANT

## 2019-07-13 NOTE — Interval H&P Note (Signed)
History and Physical Interval Note:  07/13/2019 3:16 PM  Carrie Barnes  has presented today for surgery, with the diagnosis of Right intertrochanteric femur fracture.  The various methods of treatment have been discussed with the patient and family. After consideration of risks, benefits and other options for treatment, the patient has consented to  Procedure(s) with comments: INTRAMEDULLARY (IM) RETROGRADE FEMORAL NAILING (Right) - 57min as a surgical intervention.  The patient's history has been reviewed, patient examined, no change in status, stable for surgery.  I have reviewed the patient's chart and labs.  Questions were answered to the patient's satisfaction.     Pilar Plate Tonika Eden

## 2019-07-13 NOTE — Progress Notes (Signed)
PROGRESS NOTE    Carrie Barnes  NTZ:[id number] DOB: 12-04-1935 DOA: 07/11/2019 PCP: Renford Dills, MD   Brief Narrative:  Patient is 83 year old female with history of coronary disease, ischemic cardiomyopathy, hypertension, hyperlipidemia, spinal stenosis, osteoporosis on Fosamax who presented after a mechanical fall when she landed on the right side while hanging shower curtain.  No loss of consciousness or trauma to the head.  Imaging done in the emerge department showed closed intertrochanteric fracture of right hip.  Orthopedics consulted and planning for ORIF today.  Assessment & Plan:   Active Problems:   CAD in native artery   Ischemic cardiomyopathy   Hyperlipemia   Osteoporosis   Closed intertrochanteric fracture of right hip (HCC)   Closed right hip fracture (HCC)   Closed intertrochanteric fracture of the right hip: Pain is well controlled at present.  Orthopedics already following, plan for ORIF today.  Continue pain management.  DVT prophylaxis after surgery.  PT/OT will be consulted after surgery.  History of coronary artery disease/ischemic cardiomyopathy: No symptoms at present.  Denies any chest pain.  On aspirin, statin and beta-blockers which we will continue.  Echocardiogram showed normal left ventricle ejection fraction, moderately elevated pulmonary artery pressure. She has history of PCI to the LAD in 2018.  Osteoporosis: on Fosamax at home.  Vitamin D level normal.  Hyperlipidemia: Continue statin  Dysuria: UA did not show any signs of UTI.  Continue to monitor.         DVT prophylaxis:SCD Code Status: Full Family Communication: Daughter at the bedside Disposition Plan: Pending PT/OT evaluation.  Waiting for surgery   Consultants: Orthopedics  Procedures: None  Antimicrobials:  Anti-infectives (From admission, onward)   None      Subjective: Patient seen and examined the bedside this morning.  Medically stable.  Comfortable.   Pain well controlled.  Denies any shortness of breath or chest pain.  Awaiting surgery.  Objective: Vitals:   07/12/19 0851 07/12/19 1416 07/12/19 2215 07/13/19 0623  BP: 117/63 137/65 140/65 136/60  Pulse: 79 85 88 91  Resp:  16 18 16   Temp:  99.3 F (37.4 C) 98.4 F (36.9 C) 97.9 F (36.6 C)  TempSrc:   Oral Oral  SpO2:  99% 95% 96%  Weight:      Height:        Intake/Output Summary (Last 24 hours) at 07/13/2019 1226 Last data filed at 07/13/2019 0701 Gross per 24 hour  Intake 2339.26 ml  Output 1250 ml  Net 1089.26 ml   Filed Weights   07/11/19 1537  Weight: 76.2 kg    Examination:  General exam: Appears calm and comfortable ,Not in distress, obese HEENT:PERRL, Ear/Nose normal on gross exam Respiratory system: Bilateral equal air entry, normal vesicular breath sounds, no wheezes or crackles  Cardiovascular system: S1 & S2 heard, RRR. No JVD, murmurs, rubs, gallops or clicks. No pedal edema. Gastrointestinal system: Abdomen is nondistended, soft and nontender. No organomegaly or masses felt. Normal bowel sounds heard. Central nervous system: Alert and oriented. No focal neurological deficits. Extremities: No edema, no clubbing ,no cyanosis, decreased range of motion on the right hip, tenderness in the right hip skin: No rashes, lesions or ulcers,no icterus ,no pallor    Data Reviewed: I have personally reviewed following labs and imaging studies  CBC: Recent Labs  Lab 07/11/19 2035 07/12/19 0447  WBC 12.6* 8.0  NEUTROABS 10.3*  --   HGB 12.2 10.1*  HCT 37.7 30.7*  MCV 95.0 91.9  PLT  102* 145*   Basic Metabolic Panel: Recent Labs  Lab 07/11/19 1604 07/12/19 0447  NA 139 137  K 4.3 4.3  CL 104 101  CO2 24 27  GLUCOSE 140* 133*  BUN 15 15  CREATININE 0.89 0.80  CALCIUM 9.2 8.7*   GFR: Estimated Creatinine Clearance: 52.7 mL/min (by C-G formula based on SCr of 0.8 mg/dL). Liver Function Tests: No results for input(s): AST, ALT, ALKPHOS, BILITOT,  PROT, ALBUMIN in the last 168 hours. No results for input(s): LIPASE, AMYLASE in the last 168 hours. No results for input(s): AMMONIA in the last 168 hours. Coagulation Profile: Recent Labs  Lab 07/11/19 1604  INR 1.0   Cardiac Enzymes: No results for input(s): CKTOTAL, CKMB, CKMBINDEX, TROPONINI in the last 168 hours. BNP (last 3 results) No results for input(s): PROBNP in the last 8760 hours. HbA1C: No results for input(s): HGBA1C in the last 72 hours. CBG: No results for input(s): GLUCAP in the last 168 hours. Lipid Profile: No results for input(s): CHOL, HDL, LDLCALC, TRIG, CHOLHDL, LDLDIRECT in the last 72 hours. Thyroid Function Tests: No results for input(s): TSH, T4TOTAL, FREET4, T3FREE, THYROIDAB in the last 72 hours. Anemia Panel: No results for input(s): VITAMINB12, FOLATE, FERRITIN, TIBC, IRON, RETICCTPCT in the last 72 hours. Sepsis Labs: No results for input(s): PROCALCITON, LATICACIDVEN in the last 168 hours.  Recent Results (from the past 240 hour(s))  SARS CORONAVIRUS 2 (TAT 6-24 HRS) Nasopharyngeal Nasopharyngeal Swab     Status: None   Collection Time: 07/11/19  7:03 PM   Specimen: Nasopharyngeal Swab  Result Value Ref Range Status   SARS Coronavirus 2 NEGATIVE NEGATIVE Final    Comment: (NOTE) SARS-CoV-2 target nucleic acids are NOT DETECTED. The SARS-CoV-2 RNA is generally detectable in upper and lower respiratory specimens during the acute phase of infection. Negative results do not preclude SARS-CoV-2 infection, do not rule out co-infections with other pathogens, and should not be used as the sole basis for treatment or other patient management decisions. Negative results must be combined with clinical observations, patient history, and epidemiological information. The expected result is Negative. Fact Sheet for Patients: HairSlick.nohttps://www.fda.gov/media/138098/download Fact Sheet for Healthcare Providers: quierodirigir.comhttps://www.fda.gov/media/138095/download This  test is not yet approved or cleared by the [place] FDA and  has been authorized for detection and/or diagnosis of SARS-CoV-2 by FDA under an Emergency Use Authorization (EUA). This EUA will remain  in effect (meaning this test can be used) for the duration of the COVID-19 declaration under Section 56 4(b)(1) of the Act, 21 U.S.C. section 360bbb-3(b)(1), unless the authorization is terminated or revoked sooner. Performed at [facility], [address] [place], [place] [address]   MRSA PCR Screening     Status: None   Collection Time: 07/12/19 10:48 AM   Specimen: Nasal Mucosa; Nasopharyngeal  Result Value Ref Range Status   MRSA by PCR NEGATIVE NEGATIVE Final    Comment:        The GeneXpert MRSA Assay (FDA approved for NASAL specimens only), is one component of a comprehensive MRSA colonization surveillance program. It is not intended to diagnose MRSA infection nor to guide or monitor treatment for MRSA infections. Performed at [facility], [address] [place], [place] [address]          Radiology Studies: Dg Chest 1 View  Result Date: 07/11/2019 CLINICAL DATA:  Preop. Pain status post fall. EXAM: CHEST  1 VIEW COMPARISON:  None. FINDINGS: There is no pneumothorax. No large pleural  effusion. The heart size is mildly enlarged. There is scarring versus atelectasis at the lung bases. There appear to be multiple calcified granulomas in the right lower lobe. There is no acute osseous abnormality. Aortic calcifications are noted. IMPRESSION: 1. No pneumothorax or large pleural effusion. 2. Bibasilar scarring versus atelectasis. 3. Multiple calcified granulomas in the right lower lobe. Electronically Signed   By: Katherine Mantle M.D.   On: 07/11/2019 18:32   Ct Head Wo Contrast  Result Date: 07/11/2019 CLINICAL DATA:  Fall.  Head injury EXAM: CT HEAD WITHOUT CONTRAST CT CERVICAL SPINE WITHOUT CONTRAST TECHNIQUE: Multidetector CT imaging of  the head and cervical spine was performed following the standard protocol without intravenous contrast. Multiplanar CT image reconstructions of the cervical spine were also generated. COMPARISON:  None. FINDINGS: CT HEAD FINDINGS Brain: Mild atrophy and mild chronic microvascular ischemic changes in the white matter. Negative for acute hemorrhage, infarct, or mass. No midline shift. Vascular: Negative for hyperdense vessel Skull: Negative for fracture.  Right frontal scalp hematoma. Sinuses/Orbits: Paranasal sinuses clear. Bilateral cataract surgery. Other: None CT CERVICAL SPINE FINDINGS Alignment: Mild anterolisthesis C6-7 otherwise normal alignment Skull base and vertebrae: Negative for fracture Soft tissues and spinal canal: Negative for soft tissue mass or swelling. Atherosclerotic calcification in the carotid bifurcation bilaterally. Disc levels: Diffuse facet degeneration right greater than left. Disc degeneration and spurring most prominent at C6-7. Mild spinal stenosis at C6-7 due to spurring. Upper chest: Negative Other: 8 mm right thyroid nodule. No further imaging is required for this lesion. IMPRESSION: 1. Atrophy and chronic microvascular ischemic change in the white matter. No acute intracranial abnormality. Right frontal scalp hematoma 2. Negative for cervical spine fracture. Electronically Signed   By: Marlan Palau M.D.   On: 07/11/2019 17:00   Ct Cervical Spine Wo Contrast  Result Date: 07/11/2019 CLINICAL DATA:  Fall.  Head injury EXAM: CT HEAD WITHOUT CONTRAST CT CERVICAL SPINE WITHOUT CONTRAST TECHNIQUE: Multidetector CT imaging of the head and cervical spine was performed following the standard protocol without intravenous contrast. Multiplanar CT image reconstructions of the cervical spine were also generated. COMPARISON:  None. FINDINGS: CT HEAD FINDINGS Brain: Mild atrophy and mild chronic microvascular ischemic changes in the white matter. Negative for acute hemorrhage, infarct, or  mass. No midline shift. Vascular: Negative for hyperdense vessel Skull: Negative for fracture.  Right frontal scalp hematoma. Sinuses/Orbits: Paranasal sinuses clear. Bilateral cataract surgery. Other: None CT CERVICAL SPINE FINDINGS Alignment: Mild anterolisthesis C6-7 otherwise normal alignment Skull base and vertebrae: Negative for fracture Soft tissues and spinal canal: Negative for soft tissue mass or swelling. Atherosclerotic calcification in the carotid bifurcation bilaterally. Disc levels: Diffuse facet degeneration right greater than left. Disc degeneration and spurring most prominent at C6-7. Mild spinal stenosis at C6-7 due to spurring. Upper chest: Negative Other: 8 mm right thyroid nodule. No further imaging is required for this lesion. IMPRESSION: 1. Atrophy and chronic microvascular ischemic change in the white matter. No acute intracranial abnormality. Right frontal scalp hematoma 2. Negative for cervical spine fracture. Electronically Signed   By: Marlan Palau M.D.   On: 07/11/2019 17:00   Dg Hip Unilat With Pelvis 2-3 Views Right  Result Date: 07/11/2019 CLINICAL DATA:  Painful right hip after fall EXAM: DG HIP (WITH OR WITHOUT PELVIS) 2-3V RIGHT COMPARISON:  CT 03/02/2017 FINDINGS: SI joints are non widened. Acute comminuted right intertrochanteric fracture with displaced lesser trochanteric fracture fragment. Pubic symphysis appears intact. The femoral head is normally position. IMPRESSION: Acute comminuted  right intertrochanteric fracture Electronically Signed   By: Donavan Foil M.D.   On: 07/11/2019 17:36        Scheduled Meds: . atorvastatin  80 mg Oral Daily  . loratadine  10 mg Oral Daily  . metoprolol succinate  50 mg Oral Daily  . pantoprazole  40 mg Oral Daily   Continuous Infusions: . sodium chloride 125 mL/hr at 07/13/19 0623  . methocarbamol (ROBAXIN) IV       LOS: 2 days    Time spent: 35 mins.More than 50% of that time was spent in counseling and/or  coordination of care.      Shelly Coss, MD [facility] Pager [phone]  If 7PM-7AM, please contact night-coverage [email or web address] Password [facility] 07/13/2019, 12:26 PM

## 2019-07-13 NOTE — Anesthesia Procedure Notes (Signed)
Procedure Name: Intubation Date/Time: 07/13/2019 4:20 PM Performed by: West Pugh, CRNA Pre-anesthesia Checklist: Patient identified, Emergency Drugs available, Suction available, Patient being monitored and Timeout performed Patient Re-evaluated:Patient Re-evaluated prior to induction Oxygen Delivery Method: Circle system utilized Preoxygenation: Pre-oxygenation with 100% oxygen Induction Type: IV induction Ventilation: Oral airway inserted - appropriate to patient size and Two handed mask ventilation required Laryngoscope Size: Mac and 3 Grade View: Grade I Tube type: Oral Tube size: 7.0 mm Number of attempts: 1 Airway Equipment and Method: Stylet Placement Confirmation: ETT inserted through vocal cords under direct vision,  positive ETCO2,  CO2 detector and breath sounds checked- equal and bilateral Secured at: 20 cm Tube secured with: Tape Dental Injury: Teeth and Oropharynx as per pre-operative assessment

## 2019-07-13 NOTE — Anesthesia Postprocedure Evaluation (Signed)
Anesthesia Post Note  Patient: Carrie Barnes  Procedure(s) Performed: INTRAMEDULLARY (IM) RETROGRADE FEMORAL NAILING (Right )     Patient location during evaluation: PACU Anesthesia Type: General Level of consciousness: awake and alert Pain management: pain level controlled Vital Signs Assessment: post-procedure vital signs reviewed and stable Respiratory status: spontaneous breathing, nonlabored ventilation and respiratory function stable Cardiovascular status: blood pressure returned to baseline and stable Postop Assessment: no apparent nausea or vomiting Anesthetic complications: no    Last Vitals:  Vitals:   07/13/19 1815 07/13/19 1830  BP: (!) 158/87 127/77  Pulse: 99 100  Resp: 15 15  Temp:    SpO2: 97% 96%    Last Pain:  Vitals:   07/13/19 1830  TempSrc:   PainSc: 0-No pain                 Lynda Rainwater

## 2019-07-13 NOTE — Op Note (Signed)
  OPERATIVE REPORT   PREOPERATIVE DIAGNOSIS: Right intertrochanteric femur fracture.   POSTOP DIAGNOSIS: Right intertrochanteric femur fracture.   PROCEDURE: Intramedullary nailing, Right intertrochanteric femur  fracture.   SURGEON: Gaynelle Arabian, M.D.   ASSISTANT: Griffith Citron, PA-C  ANESTHESIA:General  Estimated BLOOD LOSS: 100 ml  DRAINS: None.   COMPLICATIONS:   None  CONDITION: -PACU - hemodynamically stable.    CLINICAL NOTE: Carrie Barnes is an 83 y.o. female, who had a fall 2  days ago sustaining a displaced  Right intertrochanteric femur fracture. They have been cleared medically and present for operative fixation   PROCEDURE IN DETAIL: After successful administration of  General,  the patient was placed on the fracture table with Right lower extremity in a well-padded traction boot,  Left lower extremity in a well-padded leg holder. Under fluoroscopic guidance, the fracture wasreduced. The traction was locked in this position. Thigh was prepped  and draped in the usual sterile fashion. The guide pin for the Biomet  Affixus was then passed percutaneously to the tip of the greater  trochanter, and then entered into the femoral canal. It was passed into the  canal. The small incision was made and the starter reamer passed over  the guide pin. This was then removed. The nail which was an 11  mm  diameter short trochanteric nail with 130 degrees angle was attached to  the external guide and then passed into the femoral canal, impacted to  the appropriate depth in the canal, then we used the external guide to  place the lag screw. Through the external guide, a guide pin was  passed. Small incision made, and the guide pin was in the center/inferior aspect of the  femoral head on the AP and center on the lateral.  Length was 90 mm. Triple reamer was passed over the guide pin. 90 mm  lag screw was placed. It was then locked down with a locking screw.  Through  the external guide, the distal interlock was placed through the  static hole and this was 32 mm in length with excellent bicortical  purchase. The external guide was then removed. Hardware was in good  position and fracture was well reduced. Wound was copiously irrigated with saline  solution, and  closed deep with interrupted 1 Vicryl, subcu  interrupted 2-0 Vicryl, subcuticular running 4-0 Monocryl. Incision was  cleaned and dried and sterile dressings applied. The patient was awakened and  transported to recovery in stable condition.   [name], MD    07/13/2019, 5:23 PM

## 2019-07-13 NOTE — Progress Notes (Signed)
Pt return from pacu, a/o x 4, daughter with patient, dressing to right upper thigh d/i, purewick placed, pt in S/r, denies pain, no numbness tingling to right leg, positive pulse, fluids infusing, scds on bi-lat. Monitor for t/o shift, q 2 turn

## 2019-07-13 NOTE — Transfer of Care (Signed)
Immediate Anesthesia Transfer of Care Note  Patient: Carrie Barnes  Procedure(s) Performed: INTRAMEDULLARY (IM) RETROGRADE FEMORAL NAILING (Right )  Patient Location: PACU  Anesthesia Type:General  Level of Consciousness: awake, alert , oriented and patient cooperative  Airway & Oxygen Therapy: Patient Spontanous Breathing and Patient connected to face mask oxygen  Post-op Assessment: Report given to RN and Post -op Vital signs reviewed and stable  Post vital signs: Reviewed and stable  Last Vitals:  Vitals Value Taken Time  BP 153/73 07/13/19 1747  Temp 36.8 C 07/13/19 1741  Pulse 102 07/13/19 1753  Resp 13 07/13/19 1753  SpO2 100 % 07/13/19 1753  Vitals shown include unvalidated device data.  Last Pain:  Vitals:   07/13/19 1427  TempSrc: Oral  PainSc:       Patients Stated Pain Goal: 5 (78/29/56 2130)  Complications: No apparent anesthesia complications

## 2019-07-13 NOTE — Anesthesia Preprocedure Evaluation (Signed)
Anesthesia Evaluation  Patient identified by MRN, date of birth, ID band Patient awake    Reviewed: Allergy & Precautions, NPO status , Patient's Chart, lab work & pertinent test results, reviewed documented beta blocker date and time   Airway Mallampati: I  TM Distance: >3 FB Neck ROM: Full    Dental no notable dental hx. (+) Edentulous Upper, Edentulous Lower   Pulmonary former smoker,    Pulmonary exam normal breath sounds clear to auscultation       Cardiovascular hypertension, Pt. on medications and Pt. on home beta blockers + CAD, + Past MI and + Cardiac Stents  + Valvular Problems/Murmurs MR  Rhythm:Regular Rate:Normal + Systolic murmurs '18 TTE - mild LVH. EF 55% to 60%. Grade 1 diastolic dysfunction. Mild MR. A trivial pericardial effusion was   identified.   Neuro/Psych negative neurological ROS  negative psych ROS   GI/Hepatic Neg liver ROS, GERD  Controlled and Medicated,  Endo/Other  negative endocrine ROS  Renal/GU negative Renal ROS  negative genitourinary   Musculoskeletal  (+) Arthritis ,   Abdominal   Peds negative pediatric ROS (+)  Hematology  (+) anemia ,   Anesthesia Other Findings   Reproductive/Obstetrics negative OB ROS                             Anesthesia Physical  Anesthesia Plan  ASA: III  Anesthesia Plan: General   Post-op Pain Management:    Induction: Intravenous  PONV Risk Score and Plan: 3 and Treatment may vary due to age or medical condition, Ondansetron, Dexamethasone and Midazolam  Airway Management Planned: Oral ETT  Additional Equipment: None  Intra-op Plan:   Post-operative Plan:   Informed Consent: I have reviewed the patients History and Physical, chart, labs and discussed the procedure including the risks, benefits and alternatives for the proposed anesthesia with the patient or authorized representative who has indicated his/her  understanding and acceptance.     Dental advisory given  Plan Discussed with: CRNA, Anesthesiologist and Surgeon  Anesthesia Plan Comments:         Anesthesia Quick Evaluation

## 2019-07-13 NOTE — Progress Notes (Signed)
Spoke with Dr earlier in shift, Ephraim Hamburger ordered for back pain as well as Protonix for reflux.  Pt had 14 beats of svtach at 0046, pt remained asymptomatic Daughter remains with patient.

## 2019-07-13 NOTE — Progress Notes (Signed)
PT Cancellation Note  Patient Details Name: Carrie Barnes MRN: [id number] DOB: July 10, 1936   Cancelled Treatment:    Reason Eval/Treat Not Completed: Medical issues which prohibited therapy - Pt awaiting ORIF procedure today, will check back post-surgery.  [place] Pager [phone]  Office [phone]    Roxine Caddy D Elonda Husky 07/13/2019, 12:44 PM

## 2019-07-14 ENCOUNTER — Inpatient Hospital Stay (HOSPITAL_COMMUNITY): Payer: Medicare Other

## 2019-07-14 ENCOUNTER — Encounter (HOSPITAL_COMMUNITY): Payer: Self-pay | Admitting: Orthopedic Surgery

## 2019-07-14 LAB — CBC WITH DIFFERENTIAL/PLATELET
Abs Immature Granulocytes: 0.17 10*3/uL — ABNORMAL HIGH (ref 0.00–0.07)
Basophils Absolute: 0 10*3/uL (ref 0.0–0.1)
Basophils Relative: 0 %
Eosinophils Absolute: 0 10*3/uL (ref 0.0–0.5)
Eosinophils Relative: 0 %
HCT: 27.4 % — ABNORMAL LOW (ref 36.0–46.0)
Hemoglobin: 9.3 g/dL — ABNORMAL LOW (ref 12.0–15.0)
Immature Granulocytes: 2 %
Lymphocytes Relative: 13 %
Lymphs Abs: 1.5 10*3/uL (ref 0.7–4.0)
MCH: 29.9 pg (ref 26.0–34.0)
MCHC: 33.9 g/dL (ref 30.0–36.0)
MCV: 88.1 fL (ref 80.0–100.0)
Monocytes Absolute: 0.9 10*3/uL (ref 0.1–1.0)
Monocytes Relative: 8 %
Neutro Abs: 8.5 10*3/uL — ABNORMAL HIGH (ref 1.7–7.7)
Neutrophils Relative %: 77 %
Platelets: 148 10*3/uL — ABNORMAL LOW (ref 150–400)
RBC: 3.11 MIL/uL — ABNORMAL LOW (ref 3.87–5.11)
RDW: 11.8 % (ref 11.5–15.5)
WBC: 11 10*3/uL — ABNORMAL HIGH (ref 4.0–10.5)
nRBC: 0 % (ref 0.0–0.2)

## 2019-07-14 LAB — BASIC METABOLIC PANEL
Anion gap: 10 (ref 5–15)
BUN: 14 mg/dL (ref 8–23)
CO2: 24 mmol/L (ref 22–32)
Calcium: 8.4 mg/dL — ABNORMAL LOW (ref 8.9–10.3)
Chloride: 101 mmol/L (ref 98–111)
Creatinine, Ser: 0.66 mg/dL (ref 0.44–1.00)
GFR calc Af Amer: >60 mL/min (ref >60)
GFR calc non Af Amer: >60 mL/min (ref >60)
Glucose, Bld: 132 mg/dL — ABNORMAL HIGH (ref 70–99)
Potassium: 5.1 mmol/L (ref 3.5–5.1)
Sodium: 135 mmol/L (ref 135–145)

## 2019-07-14 NOTE — TOC Initial Note (Signed)
Transition of Care [facility]) - Initial/Assessment Note    Patient Details  Name: Carrie Barnes MRN: [id number] Date of Birth: 12/14/1935  Transition of Care [facility]) CM/SW Contact:    Dessa Phi, RN Phone Number: 07/14/2019, 12:06 PM  Clinical Narrative: Carrie Barnes to dtr Carrie Barnes-d/c plans are to go home w/HHC-await PT/OT cons-will check on [facility] agency to accept. Family to assist.May need dme. Patient will stay @ new apt-dtr will provide address once available in [place].                   Expected Discharge Plan: [place] Barriers to Discharge: Continued Medical Work up   Patient Goals and CMS Choice Patient states their goals for this hospitalization and ongoing recovery are:: go home CMS Medicare.gov Compare Post Acute Care list provided to:: Patient Represenative (must comment) Choice offered to / list presented to : Patient, Adult Children  Expected Discharge Plan and Services Expected Discharge Plan: [place]   Discharge Planning Services: CM Consult Post Acute Care Choice: [place] arrangements for the past 2 months: Apartment                                      Prior Living Arrangements/Services Living arrangements for the past 2 months: Apartment Lives with:: Self Patient language and need for interpreter reviewed:: Yes Do you feel safe going back to the place where you live?: Yes      Need for Family Participation in Patient Care: No (Comment) Care giver support system in place?: Yes (comment)   Criminal Activity/Legal Involvement Pertinent to Current Situation/Hospitalization: No - Comment as needed  Activities of Daily Living Home Assistive Devices/Equipment: None ADL Screening (condition at time of admission) Patient's cognitive ability adequate to safely complete daily activities?: Yes Is the patient deaf or have difficulty hearing?: No Does the patient have difficulty seeing, even when wearing  glasses/contacts?: No Does the patient have difficulty concentrating, remembering, or making decisions?: No Patient able to express need for assistance with ADLs?: Yes Does the patient have difficulty dressing or bathing?: Yes Independently performs ADLs?: No Communication: Independent Dressing (OT): Needs assistance Is this a change from baseline?: Change from baseline, expected to last <3days Grooming: Needs assistance Is this a change from baseline?: Change from baseline, expected to last <3 days Feeding: Needs assistance Is this a change from baseline?: Change from baseline, expected to last <3 days Bathing: Needs assistance Is this a change from baseline?: Change from baseline, expected to last <3 days Toileting: Needs assistance Is this a change from baseline?: Pre-admission baseline In/Out Bed: Needs assistance Is this a change from baseline?: Pre-admission baseline Walks in Home: Needs assistance Is this a change from baseline?: Pre-admission baseline Does the patient have difficulty walking or climbing stairs?: Yes Weakness of Legs: Right Weakness of Arms/Hands: Left  Permission Sought/Granted Permission sought to share information with : Case Manager Permission granted to share information with : Yes, Verbal Permission Granted  Share Information with NAME: Carrie Barnes dtr [phone]  Permission granted to share info w AGENCY: HHC        Emotional Assessment Appearance:: Appears stated age Attitude/Demeanor/Rapport: Gracious Affect (typically observed): Accepting Orientation: : Oriented to Self, Oriented to Place Alcohol / Substance Use: Not Applicable Psych Involvement: No (comment)  Admission diagnosis:  Fall [W19.XXXA] Fall, initial encounter [id number]] Closed displaced intertrochanteric fracture  of right femur, initial encounter [facility]) [S72.141A] Patient Active Problem List   Diagnosis Date Noted  . Closed intertrochanteric fracture of right hip (HCC)  07/11/2019  . Closed right hip fracture (HCC) 07/11/2019  . Chronic cholecystitis due to cholelithiasis with choledocholithiasis 11/05/2017  . Cholelithiasis with chronic cholecystitis 11/01/2017  . Cholelithiasis 05/18/2017  . CAD in native artery 03/31/2017  . Ischemic cardiomyopathy 03/31/2017  . Hyperlipemia 03/31/2017  . DJD (degenerative joint disease) 03/31/2017  . Osteoporosis 03/31/2017   PCP:  Renford Dills, MD Pharmacy:   [id number] - [place], [place] - [address] [place] [address] Phone: [phone] Fax: [phone]  [facility] SERVICE - [place], [place] - [address] [facility] [address] [place] [address] [place] [address] Phone: [phone] Fax: [phone]     Social Determinants of Health (SDOH) Interventions    Readmission Risk Interventions No flowsheet data found.

## 2019-07-14 NOTE — Plan of Care (Signed)
  Problem: Education: Goal: Required Educational Video(s) Outcome: Progressing   Problem: Clinical Measurements: Goal: Ability to maintain clinical measurements within normal limits will improve Outcome: Progressing Goal: Postoperative complications will be avoided or minimized Outcome: Progressing   Problem: Skin Integrity: Goal: Demonstration of wound healing without infection will improve Outcome: Progressing   Problem: Education: Goal: Knowledge of General Education information will improve Description: Including pain rating scale, medication(s)/side effects and non-pharmacologic comfort measures Outcome: Progressing   Problem: Health Behavior/Discharge Planning: Goal: Ability to manage health-related needs will improve Outcome: Progressing   Problem: Clinical Measurements: Goal: Ability to maintain clinical measurements within normal limits will improve Outcome: Progressing Goal: Will remain free from infection Outcome: Progressing Goal: Diagnostic test results will improve Outcome: Progressing Goal: Respiratory complications will improve Outcome: Progressing Goal: Cardiovascular complication will be avoided Outcome: Progressing   Problem: Activity: Goal: Risk for activity intolerance will decrease Outcome: Progressing   Problem: Nutrition: Goal: Adequate nutrition will be maintained Outcome: Progressing   Problem: Coping: Goal: Level of anxiety will decrease Outcome: Progressing   Problem: Elimination: Goal: Will not experience complications related to bowel motility Outcome: Progressing Goal: Will not experience complications related to urinary retention Outcome: Progressing   Problem: Pain Managment: Goal: General experience of comfort will improve Outcome: Progressing   Problem: Safety: Goal: Ability to remain free from injury will improve Outcome: Progressing   Problem: Skin Integrity: Goal: Risk for impaired skin integrity will  decrease Outcome: Progressing   Problem: Education: Goal: Verbalization of understanding the information provided (i.e., activity precautions, restrictions, etc) will improve Outcome: Progressing Goal: Individualized Educational Video(s) Outcome: Progressing   Problem: Activity: Goal: Ability to ambulate and perform ADLs will improve Outcome: Progressing   Problem: Clinical Measurements: Goal: Postoperative complications will be avoided or minimized Outcome: Progressing   Problem: Self-Concept: Goal: Ability to maintain and perform role responsibilities to the fullest extent possible will improve Outcome: Progressing   Problem: Pain Management: Goal: Pain level will decrease Outcome: Progressing

## 2019-07-14 NOTE — Progress Notes (Signed)
Subjective: 1 Day Post-Op Procedure(s) (LRB): INTRAMEDULLARY (IM) RETROGRADE FEMORAL NAILING (Right) Patient reports pain as mild.  Feels a lot better today  Objective: Vital signs in last 24 hours: Temp:  [97.8 F (36.6 C)-99.4 F (37.4 C)] 98.3 F (36.8 C) (12/03 0516) Pulse Rate:  [77-100] 85 (12/03 0516) Resp:  [12-18] 16 (12/03 0516) BP: (111-158)/(48-116) 135/66 (12/03 0516) SpO2:  [87 %-100 %] 91 % (12/03 0102)  Intake/Output from previous day: 12/02 0701 - 12/03 0700 In: 3208.8 [P.O.:120; I.V.:2638.8; IV Piggyback:450] Out: 900 [Urine:900] Intake/Output this shift: No intake/output data recorded.  Recent Labs    07/11/19 2035 07/12/19 0447 07/14/19 0503  HGB 12.2 10.1* 9.3*   Recent Labs    07/12/19 0447 07/14/19 0503  WBC 8.0 11.0*  RBC 3.34* 3.11*  HCT 30.7* 27.4*  PLT 145* 148*   Recent Labs    07/12/19 0447 07/14/19 0503  NA 137 135  K 4.3 5.1  CL 101 101  CO2 27 24  BUN 15 14  CREATININE 0.80 0.66  GLUCOSE 133* 132*  CALCIUM 8.7* 8.4*   Recent Labs    07/11/19 1604  INR 1.0    Neurologically intact Neurovascular intact No cellulitis present Compartment soft   Assessment/Plan: 1 Day Post-Op Procedure(s) (LRB): INTRAMEDULLARY (IM) RETROGRADE FEMORAL NAILING (Right) Advance diet Up with therapy D/C IV fluids  Plan will be for discharge home with HHPT once safe and stable    Dshawn Mcnay 07/14/2019, 7:26 AM

## 2019-07-14 NOTE — Progress Notes (Addendum)
PROGRESS NOTE    Carrie Barnes  ZOX:[id number] DOB: 06-25-1936 DOA: 07/11/2019 PCP: Renford Dills, MD   Brief Narrative:  Patient is 83 year old female with history of coronary disease, ischemic cardiomyopathy, hypertension, hyperlipidemia, spinal stenosis, osteoporosis on Fosamax who presented after a mechanical fall when she landed on the right side while hanging shower curtain.  No loss of consciousness or trauma to the head.  Imaging done in the emergency department showed closed intertrochanteric fracture of right hip.  Orthopedics consulted and she underwent  ORIF on 07/13/19.  PT/OT recommended home health PT.  Plan for discharge tomorrow.  Assessment & Plan:   Principal Problem:   Closed intertrochanteric fracture of right hip (HCC) Active Problems:   CAD in native artery   Ischemic cardiomyopathy   Hyperlipemia   Osteoporosis   Closed right hip fracture (HCC)   Closed intertrochanteric fracture of the right hip: Pain is well controlled at present.  Orthopedics  Following.S/p ORIF .  Continue pain management.  DVT prophylaxis with lovenox.  PT/OT recommended home health.  Right arm pain: X-ray did not show any fracture or dislocation.  History of coronary artery disease/ischemic cardiomyopathy: No symptoms at present.  Denies any chest pain.  On aspirin, statin and beta-blockers which we will continue.  Echocardiogram showed normal left ventricle ejection fraction, moderately elevated pulmonary artery pressure. She has history of PCI to the LAD in 2018.  Osteoporosis: on Fosamax at home.  Vitamin D level normal.  Hyperlipidemia: Continue statin  Normocytic anemia: Hemoglobin dropped from 12.2 to 9.3 today.  Most likely associated with surgery.  Will check CBC tomorrow.No obvious  hematoma on the surgical site.  Dysuria: UA did not show any signs of UTI.  Continue to monitor.         DVT prophylaxis: Lovenox Code Status: Full Family Communication: Daughter  at the bedside Disposition Plan: Home with home health tomorrow   Consultants: Orthopedics  Procedures: None  Antimicrobials:  Anti-infectives (From admission, onward)   Start     Dose/Rate Route Frequency Ordered Stop   07/13/19 2200  ceFAZolin (ANCEF) IVPB 1 g/50 mL premix     1 g 100 mL/hr over 30 Minutes Intravenous Every 6 hours 07/13/19 2045 07/14/19 0421   07/13/19 1515  ceFAZolin (ANCEF) IVPB 2g/100 mL premix     2 g 200 mL/hr over 30 Minutes Intravenous On call to O.R. 07/13/19 1456 07/13/19 1651   07/13/19 1500  ceFAZolin (ANCEF) IVPB 2g/100 mL premix  Status:  Discontinued     2 g 200 mL/hr over 30 Minutes Intravenous  Once 07/13/19 1455 07/13/19 1504   07/13/19 1423  ceFAZolin (ANCEF) 2-4 GM/100ML-% IVPB    Note to Pharmacy: Vevelyn Royals  : cabinet override      07/13/19 1423 07/13/19 1621      Subjective: Patient seen and examined the bedside this morning.  Hemodynamically stable.  Pain well controlled.  Complains of pain today on her right arm.  X-ray did not show any acute abnormalities.  Eager to go home.  Objective: Vitals:   07/13/19 2254 07/13/19 2350 07/14/19 0102 07/14/19 0516  BP: (!) 125/54 (!) 113/48 (!) 111/51 135/66  Pulse: 92 79 81 85  Resp: 16 16 16 16   Temp: 98.7 F (37.1 C) 97.8 F (36.6 C) 98.6 F (37 C) 98.3 F (36.8 C)  TempSrc: Oral  Oral Oral  SpO2: 93% 94% 91%   Weight:      Height:  Intake/Output Summary (Last 24 hours) at 07/14/2019 1258 Last data filed at 07/14/2019 0600 Gross per 24 hour  Intake 3208.77 ml  Output 550 ml  Net 2658.77 ml   Filed Weights   07/11/19 1537  Weight: 76.2 kg    Examination:  General exam: Appears calm and comfortable ,Not in distress, obese HEENT:PERRL, Ear/Nose normal on gross exam Respiratory system: Bilateral equal air entry, normal vesicular breath sounds, no wheezes or crackles  Cardiovascular system: S1 & S2 heard, RRR. No JVD, murmurs, rubs, gallops or clicks. No pedal  edema. Gastrointestinal system: Abdomen is nondistended, soft and nontender. No organomegaly or masses felt. Normal bowel sounds heard. Central nervous system: Alert and oriented. No focal neurological deficits. Extremities: Trace edema in bilateral hands, no clubbing ,no cyanosis, decreased range of motion on the right hip, tenderness in the right hip, clean surgical wound on the right hip skin: No rashes, lesions or ulcers,no icterus ,no pallor    Data Reviewed: I have personally reviewed following labs and imaging studies  CBC: Recent Labs  Lab 07/11/19 2035 07/12/19 0447 07/14/19 0503  WBC 12.6* 8.0 11.0*  NEUTROABS 10.3*  --  8.5*  HGB 12.2 10.1* 9.3*  HCT 37.7 30.7* 27.4*  MCV 95.0 91.9 88.1  PLT 102* 145* 148*   Basic Metabolic Panel: Recent Labs  Lab 07/11/19 1604 07/12/19 0447 07/14/19 0503  NA 139 137 135  K 4.3 4.3 5.1  CL 104 101 101  CO2 24 27 24   GLUCOSE 140* 133* 132*  BUN 15 15 14   CREATININE 0.89 0.80 0.66  CALCIUM 9.2 8.7* 8.4*   GFR: Estimated Creatinine Clearance: 52.7 mL/min (by C-G formula based on SCr of 0.66 mg/dL). Liver Function Tests: No results for input(s): AST, ALT, ALKPHOS, BILITOT, PROT, ALBUMIN in the last 168 hours. No results for input(s): LIPASE, AMYLASE in the last 168 hours. No results for input(s): AMMONIA in the last 168 hours. Coagulation Profile: Recent Labs  Lab 07/11/19 1604  INR 1.0   Cardiac Enzymes: No results for input(s): CKTOTAL, CKMB, CKMBINDEX, TROPONINI in the last 168 hours. BNP (last 3 results) No results for input(s): PROBNP in the last 8760 hours. HbA1C: No results for input(s): HGBA1C in the last 72 hours. CBG: No results for input(s): GLUCAP in the last 168 hours. Lipid Profile: No results for input(s): CHOL, HDL, LDLCALC, TRIG, CHOLHDL, LDLDIRECT in the last 72 hours. Thyroid Function Tests: No results for input(s): TSH, T4TOTAL, FREET4, T3FREE, THYROIDAB in the last 72 hours. Anemia Panel: No  results for input(s): VITAMINB12, FOLATE, FERRITIN, TIBC, IRON, RETICCTPCT in the last 72 hours. Sepsis Labs: No results for input(s): PROCALCITON, LATICACIDVEN in the last 168 hours.  Recent Results (from the past 240 hour(s))  SARS CORONAVIRUS 2 (TAT 6-24 HRS) Nasopharyngeal Nasopharyngeal Swab     Status: None   Collection Time: 07/11/19  7:03 PM   Specimen: Nasopharyngeal Swab  Result Value Ref Range Status   SARS Coronavirus 2 NEGATIVE NEGATIVE Final    Comment: (NOTE) SARS-CoV-2 target nucleic acids are NOT DETECTED. The SARS-CoV-2 RNA is generally detectable in upper and lower respiratory specimens during the acute phase of infection. Negative results do not preclude SARS-CoV-2 infection, do not rule out co-infections with other pathogens, and should not be used as the sole basis for treatment or other patient management decisions. Negative results must be combined with clinical observations, patient history, and epidemiological information. The expected result is Negative. Fact Sheet for Patients: HairSlick.no Fact Sheet for Healthcare Providers:  https://www.woods-mathews.com/ This test is not yet approved or cleared by the [place] and  has been authorized for detection and/or diagnosis of SARS-CoV-2 by FDA under an Emergency Use Authorization (EUA). This EUA will remain  in effect (meaning this test can be used) for the duration of the COVID-19 declaration under Section 56 4(b)(1) of the Act, 21 U.S.C. section 360bbb-3(b)(1), unless the authorization is terminated or revoked sooner. Performed at [facility], [place] [address], [place], [place] [address]   MRSA PCR Screening     Status: None   Collection Time: 07/12/19 10:48 AM   Specimen: Nasal Mucosa; Nasopharyngeal  Result Value Ref Range Status   MRSA by PCR NEGATIVE NEGATIVE Final    Comment:        The GeneXpert MRSA Assay (FDA approved for NASAL specimens  only), is one component of a comprehensive MRSA colonization surveillance program. It is not intended to diagnose MRSA infection nor to guide or monitor treatment for MRSA infections. Performed at [facility], [place] [address], [place], [place] [address]          Radiology Studies: Dg Shoulder Right  Result Date: 07/14/2019 CLINICAL DATA:  Fall, right arm swelling EXAM: RIGHT SHOULDER - 2+ VIEW COMPARISON:  It has FINDINGS: There is no dislocation. No acute fracture. Joint spaces are preserved. High-riding position of the humeral head likely reflecting rotator cuff disease. IMPRESSION: No acute fracture or dislocation. Electronically Signed   By: Macy Mis M.D.   On: 07/14/2019 10:16   Dg C-arm 1-60 Min-no Report  Result Date: 07/13/2019 Fluoroscopy was utilized by the requesting physician.  No radiographic interpretation.   Dg Hip Operative Unilat W Or W/o Pelvis Right  Result Date: 07/13/2019 CLINICAL DATA:  IM nail right femoral neck fracture EXAM: OPERATIVE right HIP (WITH PELVIS IF PERFORMED) 3 VIEWS TECHNIQUE: Fluoroscopic spot image(s) were submitted for interpretation post-operatively. COMPARISON:  07/11/2019 FINDINGS: Three low resolution intraoperative spot views of the right hip. Total fluoroscopy time was 42 seconds. The images demonstrate internal fixation of comminuted intertrochanteric fracture. Displaced lesser trochanteric fracture fragment is noted. IMPRESSION: Intraoperative fluoroscopic assistance provided during surgical fixation of right femur fracture Electronically Signed   By: Donavan Foil M.D.   On: 07/13/2019 18:41        Scheduled Meds: . atorvastatin  80 mg Oral Daily  . docusate sodium  100 mg Oral BID  . enoxaparin (LOVENOX) injection  40 mg Subcutaneous Q24H  . loratadine  10 mg Oral Daily  . metoprolol succinate  50 mg Oral Daily  . pantoprazole  40 mg Oral Daily   Continuous Infusions: . methocarbamol (ROBAXIN) IV     . methocarbamol (ROBAXIN) IV       LOS: 3 days    Time spent: 35 mins.More than 50% of that time was spent in counseling and/or coordination of care.      Shelly Coss, MD [facility] Pager [phone]  If 7PM-7AM, please contact night-coverage [email or web address] Password [email or web address] 07/14/2019, 12:58 PM

## 2019-07-14 NOTE — TOC Progression Note (Signed)
Transition of Care [facility]) - Progression Note    Patient Details  Name: Carrie Barnes MRN: [id number] Date of Birth: 12/25/1935  [facility]) CM/SW Contact  Sharmarke Cicio, Juliann Pulse, RN Phone Number: 07/14/2019, 12:12 PM  Clinical Narrative: [facility] accepted rep Ronalee Belts following.      Expected Discharge Plan: [place] Barriers to Discharge: Continued Medical Work up  Expected Discharge Plan and Services Expected Discharge Plan: [place]   Discharge Planning Services: CM Consult Post Acute Care Choice: [place] arrangements for the past 2 months: Apartment                           HH Arranged: PT, OT [place] [facility]: [facility] [employer]     Representative spoke with at [place]: [place]) Interventions    Readmission Risk Interventions No flowsheet data found.

## 2019-07-14 NOTE — Evaluation (Signed)
Physical Therapy Evaluation Patient Details Name: Carrie Barnes MRN: [id number] DOB: Nov 29, 1935 Today's Date: 07/14/2019   History of Present Illness  83 yo female admitted to ED on 11/30 for fall while standing on a step ladder hanging a shower curtain. Pt with R intertrochanteric femur fracture, s/p IM nail on 12/2. PMH includes CAD with history of MI and stenting, DJD, HLD, HTN, ischemic cardiomyopathy, osteoporosis.  Clinical Impression   Pt presents with R hip pain, RUE and shoulder pain, bruising along R forehead due to fall, post-surgical weakness of RLE, difficulty performing bed mobility and transfer, inability to ambulate today due to near syncope, and decreased activity tolerance. Pt to benefit from acute PT to address deficits. Pt required mod assist for bed mobility and transfer to standing today, limited by near syncope and R hip pain. BP 115/51 and HR 113 bpm pre-stand, BP 109/53 and HR 103 bpm post-stand, so relatively stable and not considered orthostatic. PT recommending HHPT given PT expects pt to progress well with mobility and will have 24/7 assist from her children that live locally, if pt does not progress mobility-wise may downgrade recommendation to SNF level of care. PT to progress mobility as tolerated, and will continue to follow acutely.      Follow Up Recommendations Home health PT;Supervision/Assistance - 24 hour    Equipment Recommendations  Rolling walker with 5" wheels;3in1 (PT)    Recommendations for Other Services       Precautions / Restrictions Precautions Precautions: Fall Restrictions Weight Bearing Restrictions: No RLE Weight Bearing: Weight bearing as tolerated      Mobility  Bed Mobility Overal bed mobility: Needs Assistance Bed Mobility: Supine to Sit     Supine to sit: Mod assist;HOB elevated     General bed mobility comments: Mod assist for trunk elevation, bilateral LE lifting and translation to EOB R>L, and scooting to  EOB with use of bed pad. Increased time to perform, attempted to use trapeze bar to pull herself up but pt limited by weakness and RUE and shoulder soreness.  Transfers Overall transfer level: Needs assistance Equipment used: Rolling walker (2 wheeled) Transfers: Sit to/from Stand Sit to Stand: Mod assist;From elevated surface         General transfer comment: Mod assist for power up, steadying, increased time to perform hip extension to neutral to come to upright. Pt reported wooziness and blacking out feeling upon standing, pt returned to supine for safety (BP in asessment, WFL).  Ambulation/Gait Ambulation/Gait assistance: (NT)              Stairs            Wheelchair Mobility    Modified Rankin (Stroke Patients Only)       Balance Overall balance assessment: Needs assistance;History of Falls Sitting-balance support: Feet supported;Single extremity supported Sitting balance-Leahy Scale: Fair     Standing balance support: Bilateral upper extremity supported;During functional activity Standing balance-Leahy Scale: Poor Standing balance comment: reliant on external support in standing                             Pertinent Vitals/Pain Pain Assessment: 0-10 Pain Score: 5  Pain Location: R hip Pain Descriptors / Indicators: Sore;Discomfort Pain Intervention(s): Limited activity within patient's tolerance;Monitored during session;Premedicated before session;Repositioned    Home Living Family/patient expects to be discharged to:: Private residence Living Arrangements: Alone Available Help at Discharge: Family;Available 24 hours/day(24/7 from 3 children who live locally,  as long as needed) Type of Home: Apartment Home Access: Level entry     Home Layout: One level Home Equipment: Grab bars - tub/shower      Prior Function Level of Independence: Independent         Comments: Pt reports being completely independent, and does not like to ask  for assist if she can help it. Pt reports this is her first fall in a few years.     Hand Dominance   Dominant Hand: Right    Extremity/Trunk Assessment   Upper Extremity Assessment Upper Extremity Assessment: Defer to OT evaluation    Lower Extremity Assessment Lower Extremity Assessment: Generalized weakness;RLE deficits/detail RLE Deficits / Details: post-surgical weakness; able to perform ankle pumps, quad set, assisted heel slide, assisted SLR RLE Sensation: WNL    Cervical / Trunk Assessment Cervical / Trunk Assessment: Normal  Communication   Communication: No difficulties  Cognition Arousal/Alertness: Awake/alert Behavior During Therapy: WFL for tasks assessed/performed Overall Cognitive Status: Within Functional Limits for tasks assessed                                        General Comments      Exercises General Exercises - Lower Extremity Ankle Circles/Pumps: AROM;Both;10 reps;Supine Quad Sets: AROM;Right;10 reps;Supine   Assessment/Plan    PT Assessment Patient needs continued PT services  PT Problem List Decreased strength;Decreased mobility;Decreased range of motion;Decreased activity tolerance;Decreased balance;Decreased knowledge of use of DME;Pain       PT Treatment Interventions DME instruction;Therapeutic activities;Gait training;Patient/family education;Therapeutic exercise;Balance training;Functional mobility training    PT Goals (Current goals can be found in the Care Plan section)  Acute Rehab PT Goals Patient Stated Goal: get stronger, decrease R sided pain PT Goal Formulation: With patient Time For Goal Achievement: 07/28/19 Potential to Achieve Goals: Good    Frequency Min 5X/week   Barriers to discharge        Co-evaluation               AM-PAC PT "6 Clicks" Mobility  Outcome Measure Help needed turning from your back to your side while in a flat bed without using bedrails?: A Little Help needed moving  from lying on your back to sitting on the side of a flat bed without using bedrails?: A Lot Help needed moving to and from a bed to a chair (including a wheelchair)?: A Lot Help needed standing up from a chair using your arms (e.g., wheelchair or bedside chair)?: A Lot Help needed to walk in hospital room?: Total Help needed climbing 3-5 steps with a railing? : Total 6 Click Score: 11    End of Session Equipment Utilized During Treatment: Gait belt Activity Tolerance: Patient limited by pain;Patient limited by fatigue;Treatment limited secondary to medical complications (Comment)(near syncope) Patient left: in bed;with call bell/phone within reach;with bed alarm set Nurse Communication: Mobility status PT Visit Diagnosis: History of falling (Z91.81);Muscle weakness (generalized) (M62.81);Difficulty in walking, not elsewhere classified (R26.2)    Time: 1240-1314 PT Time Calculation (min) (ACUTE ONLY): 34 min   Charges:   PT Evaluation $PT Eval Low Complexity: 1 Low PT Treatments $Therapeutic Activity: 8-22 mins        Davie Claud E, PT Acute Rehabilitation Services Pager [phone]  Office [phone]  Rainn Bullinger D Donato Studley 07/14/2019, 2:31 PM

## 2019-07-14 NOTE — Care Management Important Message (Signed)
Important Message  Patient Details IM Letter given to Dessa Phi RN to present to the Patient Name: Carrie Barnes MRN: [id number] Date of Birth: Dec 29, 1935   Medicare Important Message Given:  Yes     Kerin Salen 07/14/2019, 12:47 PM

## 2019-07-15 LAB — CBC
HCT: 25.2 % — ABNORMAL LOW (ref 36.0–46.0)
Hemoglobin: 8.7 g/dL — ABNORMAL LOW (ref 12.0–15.0)
MCH: 30.4 pg (ref 26.0–34.0)
MCHC: 34.5 g/dL (ref 30.0–36.0)
MCV: 88.1 fL (ref 80.0–100.0)
Platelets: 162 10*3/uL (ref 150–400)
RBC: 2.86 MIL/uL — ABNORMAL LOW (ref 3.87–5.11)
RDW: 12.1 % (ref 11.5–15.5)
WBC: 11.1 10*3/uL — ABNORMAL HIGH (ref 4.0–10.5)
nRBC: 0 % (ref 0.0–0.2)

## 2019-07-15 MED ORDER — HYDROCODONE-ACETAMINOPHEN 5-325 MG PO TABS: 1.0000 | ORAL_TABLET | Freq: Four times a day (QID) | ORAL | 0 refills | Status: DC | PRN

## 2019-07-15 MED ORDER — ASPIRIN EC 325 MG PO TBEC: 325.0000 mg | DELAYED_RELEASE_TABLET | Freq: Two times a day (BID) | ORAL | 0 refills | Status: DC

## 2019-07-15 NOTE — Progress Notes (Signed)
Subjective: 2 Days Post-Op Procedure(s) (LRB): INTRAMEDULLARY (IM) RETROGRADE FEMORAL NAILING (Right) Patient reports pain as mild.   Unable to do much with PT yesterday  Objective: Vital signs in last 24 hours: Temp:  [97.7 F (36.5 C)-98.9 F (37.2 C)] 98.9 F (37.2 C) (12/04 0555) Pulse Rate:  [92-108] 96 (12/04 0555) Resp:  [18] 18 (12/04 0555) BP: (104-140)/(44-71) 140/71 (12/04 0555) SpO2:  [67 %-97 %] 97 % (12/04 0555)  Intake/Output from previous day: 12/03 0701 - 12/04 0700 In: 360 [P.O.:360] Out: 100 [Urine:100] Intake/Output this shift: No intake/output data recorded.  Recent Labs    07/14/19 0503 07/15/19 0607  HGB 9.3* 8.7*   Recent Labs    07/14/19 0503 07/15/19 0607  WBC 11.0* 11.1*  RBC 3.11* 2.86*  HCT 27.4* 25.2*  PLT 148* 162   Recent Labs    07/14/19 0503  NA 135  K 5.1  CL 101  CO2 24  BUN 14  CREATININE 0.66  GLUCOSE 132*  CALCIUM 8.4*   No results for input(s): LABPT, INR in the last 72 hours.  Neurologically intact Neurovascular intact No cellulitis present Compartment soft   Assessment/Plan: 2 Days Post-Op Procedure(s) (LRB): INTRAMEDULLARY (IM) RETROGRADE FEMORAL NAILING (Right) Up with therapy  Will not be ready for discharge today as she has not progressed enough with PT to be safe at home. Should be ready over weekend  Follow up in office in 2 weeks. Will change DVT prophylaxis to Aspirin upon discharge   Gaynelle Arabian 07/15/2019, 7:21 AM

## 2019-07-15 NOTE — Progress Notes (Signed)
OT Cancellation Note  Patient Details Name: Latish Toutant MRN: [id number] DOB: [birth year]   Cancelled Treatment:    Reason Eval/Treat Not Completed: Other (comment)  Pt just got back to bed. Will check back this pm.  Deddrick Saindon 07/15/2019, 11:39 AM  Lesle Chris, OTR/L Acute Rehabilitation Services [phone] [facility] pager [phone] office 07/15/2019

## 2019-07-15 NOTE — Progress Notes (Signed)
Physical Therapy Treatment Patient Details Name: Carrie Barnes MRN: [id number] DOB: 1935-09-23 Today's Date: 07/15/2019    History of Present Illness 83 yo female admitted to ED on 11/30 for fall while standing on a step ladder hanging a shower curtain. Pt with R intertrochanteric femur fracture, s/p IM nail on 12/2. PMH includes CAD with history of MI and stenting, DJD, HLD, HTN, ischemic cardiomyopathy, osteoporosis.    PT Comments    Pt assisted with ambulating short distance.  Pt fatigues quickly and require min-mod assist for mobilizing.  Pt also performed a few exercises in recliner. Pt to d/c home possibly this weekend.  Requested pt coordinate PT with family member being present for safety and mobility training.    Follow Up Recommendations  Home health PT;Supervision/Assistance - 24 hour     Equipment Recommendations  Rolling walker with 5" wheels;3in1 (PT)    Recommendations for Other Services       Precautions / Restrictions Precautions Precautions: Fall Restrictions RLE Weight Bearing: Weight bearing as tolerated    Mobility  Bed Mobility Overal bed mobility: Needs Assistance Bed Mobility: Supine to Sit     Supine to sit: Mod assist     General bed mobility comments: verbal cues for technique, assist for R LE, increased assist required for trunk upright  Transfers Overall transfer level: Needs assistance Equipment used: Rolling walker (2 wheeled) Transfers: Sit to/from Stand Sit to Stand: Min assist         General transfer comment: verbal cues for UE and LE positioning, assist to rise, steady, and control descent  Ambulation/Gait Ambulation/Gait assistance: Min assist;+2 safety/equipment Gait Distance (Feet): 11 Feet Assistive device: Rolling walker (2 wheeled) Gait Pattern/deviations: Decreased stride length;Step-to pattern;Decreased stance time - right;Antalgic     General Gait Details: verbal cues for sequence, RW positioning, step  length, assist for steadying and occasional buckling of L LE, cues for use of UEs to self assist on RW; recliner following for safety and pt fatigues quickly; 11'x1 and then 5'x1   Stairs             Wheelchair Mobility    Modified Rankin (Stroke Patients Only)       Balance                                            Cognition Arousal/Alertness: Awake/alert Behavior During Therapy: WFL for tasks assessed/performed Overall Cognitive Status: Within Functional Limits for tasks assessed                                        Exercises General Exercises - Lower Extremity Ankle Circles/Pumps: AROM;Both;10 reps;Supine Quad Sets: AROM;Right;10 reps;Supine Heel Slides: AAROM;Right;10 reps Hip ABduction/ADduction: AAROM;Right;10 reps    General Comments        Pertinent Vitals/Pain Pain Assessment: 0-10 Pain Score: 4  Pain Location: R hip Pain Descriptors / Indicators: Sore;Discomfort Pain Intervention(s): Repositioned;Ice applied;Monitored during session    Home Living                      Prior Function            PT Goals (current goals can now be found in the care plan section) Progress towards PT goals: Progressing toward goals  Frequency    Min 5X/week      PT Plan Current plan remains appropriate    Co-evaluation              AM-PAC PT "6 Clicks" Mobility   Outcome Measure  Help needed turning from your back to your side while in a flat bed without using bedrails?: A Little Help needed moving from lying on your back to sitting on the side of a flat bed without using bedrails?: A Lot Help needed moving to and from a bed to a chair (including a wheelchair)?: A Little Help needed standing up from a chair using your arms (e.g., wheelchair or bedside chair)?: A Little Help needed to walk in hospital room?: A Lot Help needed climbing 3-5 steps with a railing? : A Lot 6 Click Score: 15    End of  Session Equipment Utilized During Treatment: Gait belt;Oxygen Activity Tolerance: Patient limited by fatigue Patient left: in chair;with chair alarm set;with call bell/phone within reach   PT Visit Diagnosis: History of falling (Z91.81);Muscle weakness (generalized) (M62.81);Difficulty in walking, not elsewhere classified (R26.2)     Time: 7412-8786 PT Time Calculation (min) (ACUTE ONLY): 16 min  Charges:  $Gait Training: 8-22 mins                    Carmelia Bake, PT, DPT Acute Rehabilitation Services Office: [phone] Pager: [phone]  Trena Platt 07/15/2019, 12:42 PM

## 2019-07-15 NOTE — Evaluation (Signed)
Occupational Therapy Evaluation Patient Details Name: Carrie Barnes MRN: [id number] DOB: Jun 25, 1936 Today's Date: 07/15/2019    History of Present Illness 83 yo female admitted to ED on 11/30 for fall while standing on a step ladder hanging a shower curtain. Pt with R intertrochanteric femur fracture, s/p IM nail on 12/2. PMH includes CAD with history of MI and stenting, DJD, HLD, HTN, ischemic cardiomyopathy, osteoporosis.   Clinical Impression   Pt was admitted for the above. She was limited by fatique but got up and used 3:1 commode.  Pt will have daughter's assistance at home.  While she only needed min A for transfer, she did sit prematurely.  She needs up to total A for LB dressing and hygiene.  Will follow in acute setting with the goals below (mostly min guard assist level)    Follow Up Recommendations  Supervision/Assistance - 24 hour;Home health OT    Equipment Recommendations  3 in 1 bedside commode    Recommendations for Other Services       Precautions / Restrictions Precautions Precautions: Fall Restrictions RLE Weight Bearing: Weight bearing as tolerated      Mobility Bed Mobility Overal bed mobility: Needs Assistance Bed Mobility: Supine to Sit     Supine to sit: Mod assist Sit to supine: Mod assist   General bed mobility comments: HOB raised; assist for RLE and trunk for OOB and legs for back to bed  Transfers Overall transfer level: Needs assistance Equipment used: Rolling walker (2 wheeled) Transfers: Sit to/from Stand Sit to Stand: Min assist         General transfer comment: verbal cues for UE and LE positioning, assist to rise, steady, and control descent    Balance                                           ADL either performed or assessed with clinical judgement   ADL Overall ADL's : Needs assistance/impaired Eating/Feeding: Independent   Grooming: Set up   Upper Body Bathing: Set up   Lower Body  Bathing: Maximal assistance;Sit to/from stand   Upper Body Dressing : Minimal assistance   Lower Body Dressing: Total assistance   Toilet Transfer: Minimal assistance;BSC;RW;Stand-pivot   Toileting- Clothing Manipulation and Hygiene: Total assistance;Sitting/lateral lean         General ADL Comments: performed toileting.  Pt fatiqued after this     Vision         Perception     Praxis      Pertinent Vitals/Pain Pain Assessment: 0-10 Pain Score: 7  Pain Location: R hip Pain Descriptors / Indicators: Sore;Discomfort Pain Intervention(s): Limited activity within patient's tolerance;Monitored during session;Premedicated before session;Repositioned     Hand Dominance Right   Extremity/Trunk Assessment Upper Extremity Assessment Upper Extremity Assessment: Generalized weakness           Communication Communication Communication: No difficulties   Cognition Arousal/Alertness: Awake/alert Behavior During Therapy: WFL for tasks assessed/performed Overall Cognitive Status: Within Functional Limits for tasks assessed                                     General Comments       Exercises General Exercises - Lower Extremity Ankle Circles/Pumps: AROM;Both;10 reps;Supine Quad Sets: AROM;Right;10 reps;Supine Heel Slides: AAROM;Right;10 reps Hip ABduction/ADduction: AAROM;Right;10  reps   Shoulder Instructions      Home Living Family/patient expects to be discharged to:: Private residence Living Arrangements: Alone   Type of Home: Apartment Home Access: Level entry     Home Layout: One level     Bathroom Shower/Tub: Producer, television/film/video: Standard     Home Equipment: (grab bar shower)          Prior Functioning/Environment Level of Independence: Independent        Comments: Pt reports being completely independent, and does not like to ask for assist if she can help it. Pt reports this is her first fall in a few years.         OT Problem List: Decreased strength;Decreased activity tolerance;Impaired balance (sitting and/or standing);Decreased knowledge of use of DME or AE;Decreased knowledge of precautions;Pain      OT Treatment/Interventions: Self-care/ADL training;DME and/or AE instruction;Therapeutic activities;Patient/family education;Balance training;Energy conservation    OT Goals(Current goals can be found in the care plan section) Acute Rehab OT Goals Patient Stated Goal: get stronger, decrease R sided pain OT Goal Formulation: With patient Time For Goal Achievement: 07/29/19 Potential to Achieve Goals: Good ADL Goals Pt Will Transfer to Toilet: with min guard assist;bedside commode;stand pivot transfer Pt Will Perform Toileting - Clothing Manipulation and hygiene: with min guard assist;sit to/from stand Additional ADL Goal #1: pt will get in/out of flat bed wtih min A in preparation for adls Additional ADL Goal #2: Pt will perform LB adls with min A using AE or stand with min guard assist x 3 minutes for assist with this  OT Frequency: Min 2X/week   Barriers to D/C:            Co-evaluation              AM-PAC OT "6 Clicks" Daily Activity     Outcome Measure Help from another person eating meals?: None Help from another person taking care of personal grooming?: A Little Help from another person toileting, which includes using toliet, bedpan, or urinal?: Total Help from another person bathing (including washing, rinsing, drying)?: A Lot Help from another person to put on and taking off regular upper body clothing?: A Little Help from another person to put on and taking off regular lower body clothing?: Total 6 Click Score: 14   End of Session    Activity Tolerance: Patient tolerated treatment well Patient left: in bed;with call bell/phone within reach;with bed alarm set  OT Visit Diagnosis: Unsteadiness on feet (R26.81);Muscle weakness (generalized) (M62.81);History of  falling (Z91.81)                Time: 4128-7867 OT Time Calculation (min): 31 min Charges:  OT General Charges $OT Visit: 1 Visit OT Evaluation $OT Eval Low Complexity: 1 Low OT Treatments $Self Care/Home Management : 8-22 mins  Marica Otter, OTR/L Acute Rehabilitation Services [phone] [facility] pager [phone] office 07/15/2019  [name] 07/15/2019, 2:04 PM

## 2019-07-15 NOTE — Progress Notes (Signed)
PROGRESS NOTE    Carrie Barnes  IWP:[id number] DOB: 07-30-1936 DOA: 07/11/2019 PCP: Renford Dills, MD   Brief Narrative:  Patient is 83 year old female with history of coronary disease, ischemic cardiomyopathy, hypertension, hyperlipidemia, spinal stenosis, osteoporosis on Fosamax who presented after a mechanical fall when she landed on the right side while hanging shower curtain.  No loss of consciousness or trauma to the head.  Imaging done in the emergency department showed closed intertrochanteric fracture of right hip.  Orthopedics consulted and she underwent  ORIF on 07/13/19.  PT/OT recommended home health PT. She needed to participate with physical therapy today. Not progressed enough with PT to be safe at home.  Plan for discharge tomorrow.  Assessment & Plan:   Principal Problem:   Closed intertrochanteric fracture of right hip (HCC) Active Problems:   CAD in native artery   Ischemic cardiomyopathy   Hyperlipemia   Osteoporosis   Closed right hip fracture (HCC)   Closed intertrochanteric fracture of the right hip: Pain is well controlled at present.  Orthopedics  Following.S/p ORIF .  Continue pain management.  DVT prophylaxis with lovenox.  PT/OT recommended home health.Dvt ppx will be changed to aspirin on DC.  Right arm pain: Much better today.X-ray did not show any fracture or dislocation.  History of coronary artery disease/ischemic cardiomyopathy: No symptoms at present.  Denies any chest pain.  On aspirin, statin and beta-blockers which we will continue.  Echocardiogram showed normal left ventricle ejection fraction, moderately elevated pulmonary artery pressure. She has history of PCI to the LAD in 2018.  Osteoporosis: on Fosamax at home.  Vitamin D level normal.  Hyperlipidemia: Continue statin  Normocytic anemia: Hb is 8.7. Presented with Hb of 12.6 Most likely associated with surgery.  Will check CBC tomorrow.No obvious  hematoma on the surgical site.  Dysuria: UA did not show any signs of UTI.  Continue to monitor.         DVT prophylaxis: Lovenox Code Status: Full Family Communication: None  at the bedside Disposition Plan: Home with home health tomorrow   Consultants: Orthopedics  Procedures: None  Antimicrobials:  Anti-infectives (From admission, onward)   Start     Dose/Rate Route Frequency Ordered Stop   07/13/19 2200  ceFAZolin (ANCEF) IVPB 1 g/50 mL premix     1 g 100 mL/hr over 30 Minutes Intravenous Every 6 hours 07/13/19 2045 07/14/19 0421   07/13/19 1515  ceFAZolin (ANCEF) IVPB 2g/100 mL premix     2 g 200 mL/hr over 30 Minutes Intravenous On call to O.R. 07/13/19 1456 07/13/19 1651   07/13/19 1500  ceFAZolin (ANCEF) IVPB 2g/100 mL premix  Status:  Discontinued     2 g 200 mL/hr over 30 Minutes Intravenous  Once 07/13/19 1455 07/13/19 1504   07/13/19 1423  ceFAZolin (ANCEF) 2-4 GM/100ML-% IVPB    Note to Pharmacy: Vevelyn Royals  : cabinet override      07/13/19 1423 07/13/19 1621      Subjective: Patient seen and examined the bedside this morning.  Hemodynamically stable.  Pain is well controlled.  She has not progressed well with PT to be safe at home so we held the discharge.  Plan for discharge tomorrow.  She participated with physical therapy.  Objective: Vitals:   07/14/19 1357 07/14/19 2035 07/15/19 0555 07/15/19 1319  BP:  (!) 118/55 140/71 129/66  Pulse: 92 97 96   Resp:  18 18 18   Temp:  97.7 F (36.5 C) 98.9 F (37.2 C)  98.4 F (36.9 C)  TempSrc:  Oral Oral Oral  SpO2: 96% 94% 97% 92%  Weight:      Height:        Intake/Output Summary (Last 24 hours) at 07/15/2019 1348 Last data filed at 07/15/2019 0930 Gross per 24 hour  Intake 360 ml  Output -  Net 360 ml   Filed Weights   07/11/19 1537  Weight: 76.2 kg    Examination:  General exam: Appears calm and comfortable ,Not in distress, obese HEENT:PERRL, Ear/Nose normal on gross exam Respiratory system: Bilateral equal air  entry, normal vesicular breath sounds, no wheezes or crackles  Cardiovascular system: S1 & S2 heard, RRR. No JVD, murmurs, rubs, gallops or clicks. No pedal edema. Gastrointestinal system: Abdomen is nondistended, soft and nontender. No organomegaly or masses felt. Normal bowel sounds heard. Central nervous system: Alert and oriented. No focal neurological deficits. Extremities: Trace edema in bilateral hands, no clubbing ,no cyanosis, decreased range of motion on the right hip, tenderness in the right hip, clean surgical wound on the right hip skin: No rashes, lesions or ulcers,no icterus ,no pallor    Data Reviewed: I have personally reviewed following labs and imaging studies  CBC: Recent Labs  Lab 07/11/19 2035 07/12/19 0447 07/14/19 0503 07/15/19 0607  WBC 12.6* 8.0 11.0* 11.1*  NEUTROABS 10.3*  --  8.5*  --   HGB 12.2 10.1* 9.3* 8.7*  HCT 37.7 30.7* 27.4* 25.2*  MCV 95.0 91.9 88.1 88.1  PLT 102* 145* 148* 546   Basic Metabolic Panel: Recent Labs  Lab 07/11/19 1604 07/12/19 0447 07/14/19 0503  NA 139 137 135  K 4.3 4.3 5.1  CL 104 101 101  CO2 24 27 24   GLUCOSE 140* 133* 132*  BUN 15 15 14   CREATININE 0.89 0.80 0.66  CALCIUM 9.2 8.7* 8.4*   GFR: Estimated Creatinine Clearance: 52.7 mL/min (by C-G formula based on SCr of 0.66 mg/dL). Liver Function Tests: No results for input(s): AST, ALT, ALKPHOS, BILITOT, PROT, ALBUMIN in the last 168 hours. No results for input(s): LIPASE, AMYLASE in the last 168 hours. No results for input(s): AMMONIA in the last 168 hours. Coagulation Profile: Recent Labs  Lab 07/11/19 1604  INR 1.0   Cardiac Enzymes: No results for input(s): CKTOTAL, CKMB, CKMBINDEX, TROPONINI in the last 168 hours. BNP (last 3 results) No results for input(s): PROBNP in the last 8760 hours. HbA1C: No results for input(s): HGBA1C in the last 72 hours. CBG: No results for input(s): GLUCAP in the last 168 hours. Lipid Profile: No results for  input(s): CHOL, HDL, LDLCALC, TRIG, CHOLHDL, LDLDIRECT in the last 72 hours. Thyroid Function Tests: No results for input(s): TSH, T4TOTAL, FREET4, T3FREE, THYROIDAB in the last 72 hours. Anemia Panel: No results for input(s): VITAMINB12, FOLATE, FERRITIN, TIBC, IRON, RETICCTPCT in the last 72 hours. Sepsis Labs: No results for input(s): PROCALCITON, LATICACIDVEN in the last 168 hours.  Recent Results (from the past 240 hour(s))  SARS CORONAVIRUS 2 (TAT 6-24 HRS) Nasopharyngeal Nasopharyngeal Swab     Status: None   Collection Time: 07/11/19  7:03 PM   Specimen: Nasopharyngeal Swab  Result Value Ref Range Status   SARS Coronavirus 2 NEGATIVE NEGATIVE Final    Comment: (NOTE) SARS-CoV-2 target nucleic acids are NOT DETECTED. The SARS-CoV-2 RNA is generally detectable in upper and lower respiratory specimens during the acute phase of infection. Negative results do not preclude SARS-CoV-2 infection, do not rule out co-infections with other pathogens, and should not be  used as the sole basis for treatment or other patient management decisions. Negative results must be combined with clinical observations, patient history, and epidemiological information. The expected result is Negative. Fact Sheet for Patients: HairSlick.no Fact Sheet for Healthcare Providers: quierodirigir.com This test is not yet approved or cleared by the [place] FDA and  has been authorized for detection and/or diagnosis of SARS-CoV-2 by FDA under an Emergency Use Authorization (EUA). This EUA will remain  in effect (meaning this test can be used) for the duration of the COVID-19 declaration under Section 56 4(b)(1) of the Act, 21 U.S.C. section 360bbb-3(b)(1), unless the authorization is terminated or revoked sooner. Performed at [facility], [address], [place], [place] [address]   MRSA PCR Screening     Status: None   Collection Time:  07/12/19 10:48 AM   Specimen: Nasal Mucosa; Nasopharyngeal  Result Value Ref Range Status   MRSA by PCR NEGATIVE NEGATIVE Final    Comment:        The GeneXpert MRSA Assay (FDA approved for NASAL specimens only), is one component of a comprehensive MRSA colonization surveillance program. It is not intended to diagnose MRSA infection nor to guide or monitor treatment for MRSA infections. Performed at [facility], [address] [place], [place] [address]          Radiology Studies: Dg Shoulder Right  Result Date: 07/14/2019 CLINICAL DATA:  Fall, right arm swelling EXAM: RIGHT SHOULDER - 2+ VIEW COMPARISON:  It has FINDINGS: There is no dislocation. No acute fracture. Joint spaces are preserved. High-riding position of the humeral head likely reflecting rotator cuff disease. IMPRESSION: No acute fracture or dislocation. Electronically Signed   By: Guadlupe Spanish M.D.   On: 07/14/2019 10:16   Dg C-arm 1-60 Min-no Report  Result Date: 07/13/2019 Fluoroscopy was utilized by the requesting physician.  No radiographic interpretation.   Dg Hip Operative Unilat W Or W/o Pelvis Right  Result Date: 07/13/2019 CLINICAL DATA:  IM nail right femoral neck fracture EXAM: OPERATIVE right HIP (WITH PELVIS IF PERFORMED) 3 VIEWS TECHNIQUE: Fluoroscopic spot image(s) were submitted for interpretation post-operatively. COMPARISON:  07/11/2019 FINDINGS: Three low resolution intraoperative spot views of the right hip. Total fluoroscopy time was 42 seconds. The images demonstrate internal fixation of comminuted intertrochanteric fracture. Displaced lesser trochanteric fracture fragment is noted. IMPRESSION: Intraoperative fluoroscopic assistance provided during surgical fixation of right femur fracture Electronically Signed   By: Jasmine Pang M.D.   On: 07/13/2019 18:41        Scheduled Meds: . atorvastatin  80 mg Oral Daily  . docusate sodium  100 mg Oral BID  . enoxaparin  (LOVENOX) injection  40 mg Subcutaneous Q24H  . loratadine  10 mg Oral Daily  . metoprolol succinate  50 mg Oral Daily  . pantoprazole  40 mg Oral Daily   Continuous Infusions: . methocarbamol (ROBAXIN) IV    . methocarbamol (ROBAXIN) IV       LOS: 4 days    Time spent: 35 mins.More than 50% of that time was spent in counseling and/or coordination of care.      Burnadette Pop, MD [facility] Pager [phone]  If 7PM-7AM, please contact night-coverage [email or web address] Password [email or web address] 07/15/2019, 1:48 PM

## 2019-07-16 ENCOUNTER — Inpatient Hospital Stay (HOSPITAL_COMMUNITY): Payer: Medicare Other

## 2019-07-16 ENCOUNTER — Encounter (HOSPITAL_COMMUNITY): Payer: Self-pay | Admitting: Radiology

## 2019-07-16 LAB — CBC
HCT: 23.6 % — ABNORMAL LOW (ref 36.0–46.0)
Hemoglobin: 7.8 g/dL — ABNORMAL LOW (ref 12.0–15.0)
MCH: 30.6 pg (ref 26.0–34.0)
MCHC: 33.1 g/dL (ref 30.0–36.0)
MCV: 92.5 fL (ref 80.0–100.0)
Platelets: 142 10*3/uL — ABNORMAL LOW (ref 150–400)
RBC: 2.55 MIL/uL — ABNORMAL LOW (ref 3.87–5.11)
RDW: 12.1 % (ref 11.5–15.5)
WBC: 7.7 10*3/uL (ref 4.0–10.5)
nRBC: 0 % (ref 0.0–0.2)

## 2019-07-16 LAB — PREPARE RBC (CROSSMATCH)

## 2019-07-16 MED ORDER — IOHEXOL 350 MG/ML SOLN
100.0000 mL | Freq: Once | INTRAVENOUS | Status: AC | PRN
  Administered 2019-07-16: 100 mL via INTRAVENOUS

## 2019-07-16 MED ORDER — SODIUM CHLORIDE (PF) 0.9 % IJ SOLN
INTRAMUSCULAR | Status: AC
  Filled 2019-07-16: qty 50

## 2019-07-16 MED ORDER — SODIUM CHLORIDE 0.9% IV SOLUTION
Freq: Once | INTRAVENOUS | Status: AC
  Administered 2019-07-16: 16:00:00 via INTRAVENOUS

## 2019-07-16 NOTE — Progress Notes (Signed)
PROGRESS NOTE    Carrie Barnes  FIE:[id number] DOB: 25-Feb-1936 DOA: 07/11/2019 PCP: Renford Dills, MD   Brief Narrative:  Patient is 83 year old female with history of coronary disease, ischemic cardiomyopathy, hypertension, hyperlipidemia, spinal stenosis, osteoporosis on Fosamax who presented after a mechanical fall when she landed on the right side while hanging shower curtain.  No loss of consciousness or trauma to the head.  Imaging done in the emergency department showed closed intertrochanteric fracture of right hip.  Orthopedics consulted and she underwent  ORIF on 07/13/19.  PT/OT recommended home health PT. Not progressed enough with PT to be safe at home.  She is still hypoxic due to fractures of the ribs on her left side and possible underlying atelectasis.  Also being transfused today with a unit of PRBC.  Assessment & Plan:   Principal Problem:   Closed intertrochanteric fracture of right hip (HCC) Active Problems:   CAD in native artery   Ischemic cardiomyopathy   Hyperlipemia   Osteoporosis   Closed right hip fracture (HCC)   Closed intertrochanteric fracture of the right hip: Pain is well controlled at present.  Orthopedics  Following.S/p ORIF .  Continue pain management.  DVT prophylaxis with lovenox.  PT/OT recommended home health.Dvt ppx will be changed to aspirin on DC.  Acute hypoxic respiratory failure: Most likely secondary to atelectasis.  She has fractures of 3 ribs on her left side.  CT chest did not show any pulmonary embolism but showed fractures, atelectasis.  Continue incentive spirometer.We will try to wean the oxygen.  Right arm pain: Much better today.X-ray did not show any fracture or dislocation.  History of coronary artery disease/ischemic cardiomyopathy: No symptoms at present.  Denies any chest pain.  On aspirin, statin and beta-blockers which we will continue.  Echocardiogram showed normal left ventricle ejection fraction, moderately  elevated pulmonary artery pressure. She has history of PCI to the LAD in 2018.  Osteoporosis: on Fosamax at home.  Vitamin D level normal.  Hyperlipidemia: Continue statin  Normocytic anemia: Hb is 7.8. Presented with Hb of 12.6 Most likely associated with surgery.No obvious  hematoma on the surgical site.  We will transfuse her with a unit of PRBC  Dysuria: UA did not show any signs of UTI.  Continue to monitor.         DVT prophylaxis: Lovenox Code Status: Full Family Communication: Discussed with daughter on phone Disposition Plan: Home with home health in 1-2 days   Consultants: Orthopedics  Procedures: None  Antimicrobials:  Anti-infectives (From admission, onward)   Start     Dose/Rate Route Frequency Ordered Stop   07/13/19 2200  ceFAZolin (ANCEF) IVPB 1 g/50 mL premix     1 g 100 mL/hr over 30 Minutes Intravenous Every 6 hours 07/13/19 2045 07/14/19 0421   07/13/19 1515  ceFAZolin (ANCEF) IVPB 2g/100 mL premix     2 g 200 mL/hr over 30 Minutes Intravenous On call to O.R. 07/13/19 1456 07/13/19 1651   07/13/19 1500  ceFAZolin (ANCEF) IVPB 2g/100 mL premix  Status:  Discontinued     2 g 200 mL/hr over 30 Minutes Intravenous  Once 07/13/19 1455 07/13/19 1504   07/13/19 1423  ceFAZolin (ANCEF) 2-4 GM/100ML-% IVPB    Note to Pharmacy: Vevelyn Royals  : cabinet override      07/13/19 1423 07/13/19 1621      Subjective: Patient seen and examined the bedside this morning.  Hemodynamically stable.  She still appears very weak, mostly bedbound.  Pain is well controlled.  Desaturates easily on room air.  Objective: Vitals:   07/15/19 1319 07/15/19 2042 07/16/19 0514 07/16/19 1055  BP: 129/66 130/72 (!) 120/51 (!) 116/93  Pulse:  86 72 90  Resp: 18 17 18 20   Temp: 98.4 F (36.9 C) 99.8 F (37.7 C) 98 F (36.7 C) 98.9 F (37.2 C)  TempSrc: Oral Oral Oral Oral  SpO2: 92%  96% 100%  Weight:   86.2 kg   Height:        Intake/Output Summary (Last 24 hours) at  07/16/2019 1241 Last data filed at 07/16/2019 0900 Gross per 24 hour  Intake 480 ml  Output 4 ml  Net 476 ml   Filed Weights   07/11/19 1537 07/16/19 0514  Weight: 76.2 kg 86.2 kg    Examination:  General exam: Not in distress, obese HEENT:PERRL, Ear/Nose normal on gross exam Respiratory system: Bilateral diminished breath sounds on the bases,  no wheezes or crackles  Cardiovascular system: S1 & S2 heard, RRR. No JVD, murmurs, rubs, gallops or clicks. No pedal edema. Gastrointestinal system: Abdomen is nondistended, soft and nontender. No organomegaly or masses felt. Normal bowel sounds heard. Central nervous system: Alert and oriented. No focal neurological deficits. Extremities: Trace edema in bilateral hands, no clubbing ,no cyanosis, decreased range of motion on the right hip, tenderness in the right hip, clean surgical wound on the right hip skin: No rashes, lesions or ulcers,no icterus ,no pallor    Data Reviewed: I have personally reviewed following labs and imaging studies  CBC: Recent Labs  Lab 07/11/19 2035 07/12/19 0447 07/14/19 0503 07/15/19 0607 07/16/19 0433  WBC 12.6* 8.0 11.0* 11.1* 7.7  NEUTROABS 10.3*  --  8.5*  --   --   HGB 12.2 10.1* 9.3* 8.7* 7.8*  HCT 37.7 30.7* 27.4* 25.2* 23.6*  MCV 95.0 91.9 88.1 88.1 92.5  PLT 102* 145* 148* 162 423*   Basic Metabolic Panel: Recent Labs  Lab 07/11/19 1604 07/12/19 0447 07/14/19 0503  NA 139 137 135  K 4.3 4.3 5.1  CL 104 101 101  CO2 24 27 24   GLUCOSE 140* 133* 132*  BUN 15 15 14   CREATININE 0.89 0.80 0.66  CALCIUM 9.2 8.7* 8.4*   GFR: Estimated Creatinine Clearance: 56.2 mL/min (by C-G formula based on SCr of 0.66 mg/dL). Liver Function Tests: No results for input(s): AST, ALT, ALKPHOS, BILITOT, PROT, ALBUMIN in the last 168 hours. No results for input(s): LIPASE, AMYLASE in the last 168 hours. No results for input(s): AMMONIA in the last 168 hours. Coagulation Profile: Recent Labs  Lab  07/11/19 1604  INR 1.0   Cardiac Enzymes: No results for input(s): CKTOTAL, CKMB, CKMBINDEX, TROPONINI in the last 168 hours. BNP (last 3 results) No results for input(s): PROBNP in the last 8760 hours. HbA1C: No results for input(s): HGBA1C in the last 72 hours. CBG: No results for input(s): GLUCAP in the last 168 hours. Lipid Profile: No results for input(s): CHOL, HDL, LDLCALC, TRIG, CHOLHDL, LDLDIRECT in the last 72 hours. Thyroid Function Tests: No results for input(s): TSH, T4TOTAL, FREET4, T3FREE, THYROIDAB in the last 72 hours. Anemia Panel: No results for input(s): VITAMINB12, FOLATE, FERRITIN, TIBC, IRON, RETICCTPCT in the last 72 hours. Sepsis Labs: No results for input(s): PROCALCITON, LATICACIDVEN in the last 168 hours.  Recent Results (from the past 240 hour(s))  SARS CORONAVIRUS 2 (TAT 6-24 HRS) Nasopharyngeal Nasopharyngeal Swab     Status: None   Collection Time: 07/11/19  7:03  PM   Specimen: Nasopharyngeal Swab  Result Value Ref Range Status   SARS Coronavirus 2 NEGATIVE NEGATIVE Final    Comment: (NOTE) SARS-CoV-2 target nucleic acids are NOT DETECTED. The SARS-CoV-2 RNA is generally detectable in upper and lower respiratory specimens during the acute phase of infection. Negative results do not preclude SARS-CoV-2 infection, do not rule out co-infections with other pathogens, and should not be used as the sole basis for treatment or other patient management decisions. Negative results must be combined with clinical observations, patient history, and epidemiological information. The expected result is Negative. Fact Sheet for Patients: HairSlick.nohttps://www.fda.gov/media/138098/download Fact Sheet for Healthcare Providers: quierodirigir.comhttps://www.fda.gov/media/138095/download This test is not yet approved or cleared by the [place] FDA and  has been authorized for detection and/or diagnosis of SARS-CoV-2 by FDA under an Emergency Use Authorization (EUA). This EUA will  remain  in effect (meaning this test can be used) for the duration of the COVID-19 declaration under Section 56 4(b)(1) of the Act, 21 U.S.C. section 360bbb-3(b)(1), unless the authorization is terminated or revoked sooner. Performed at [facility], [address] [place], [place] [address]   MRSA PCR Screening     Status: None   Collection Time: 07/12/19 10:48 AM   Specimen: Nasal Mucosa; Nasopharyngeal  Result Value Ref Range Status   MRSA by PCR NEGATIVE NEGATIVE Final    Comment:        The GeneXpert MRSA Assay (FDA approved for NASAL specimens only), is one component of a comprehensive MRSA colonization surveillance program. It is not intended to diagnose MRSA infection nor to guide or monitor treatment for MRSA infections. Performed at [facility], [address] [place], [place] [address]          Radiology Studies: Ct Angio Chest Pe W Or Wo Contrast  Result Date: 07/16/2019 CLINICAL DATA:  Hypoxemia EXAM: CT ANGIOGRAPHY CHEST WITH CONTRAST TECHNIQUE: Multidetector CT imaging of the chest was performed using the standard protocol during bolus administration of intravenous contrast. Multiplanar CT image reconstructions and MIPs were obtained to evaluate the vascular anatomy. CONTRAST:  100mL OMNIPAQUE IOHEXOL 350 MG/ML SOLN COMPARISON:  Chest radiograph dated 07/11/2019 FINDINGS: Cardiovascular: Satisfactory opacification of the pulmonary arteries to the segmental level. No evidence of pulmonary embolism. Vascular calcifications are seen in the coronary arteries and aortic arch. The heart is enlarged. No pericardial effusion. Mediastinum/Nodes: No enlarged mediastinal, hilar, or axillary lymph nodes. Thyroid gland, trachea, and esophagus demonstrate no significant findings. Lungs/Pleura: There is a small to moderate left and trace right pleural effusion with associated atelectasis. There is also mild right middle lobe atelectasis. There is no  pneumothorax. Upper Abdomen: No acute abnormality. Musculoskeletal: There are acute fractures of the posterolateral ninth, tenth, and eleventh ribs. Review of the MIP images confirms the above findings. IMPRESSION: 1. No acute pulmonary embolism. 2. Acute fractures of the left posterolateral ninth, tenth, and eleventh ribs. 3. Small to moderate left and trace right pleural effusions with associated atelectasis. 4. Cardiomegaly. Aortic Atherosclerosis (ICD10-I70.0). Electronically Signed   By: Romona Curlsyler  Litton M.D.   On: 07/16/2019 11:59        Scheduled Meds:  sodium chloride   Intravenous Once   atorvastatin  80 mg Oral Daily   docusate sodium  100 mg Oral BID   enoxaparin (LOVENOX) injection  40 mg Subcutaneous Q24H   loratadine  10 mg Oral Daily   metoprolol succinate  50 mg Oral Daily   pantoprazole  40 mg Oral Daily  sodium chloride (PF)       Continuous Infusions:  methocarbamol (ROBAXIN) IV     methocarbamol (ROBAXIN) IV       LOS: 5 days    Time spent: 35 mins.More than 50% of that time was spent in counseling and/or coordination of care.      Burnadette Pop, MD [facility] Pager [phone]  If 7PM-7AM, please contact night-coverage [email or web address] Password [email or web address] 07/16/2019, 12:41 PM

## 2019-07-16 NOTE — Progress Notes (Signed)
    Subjective:  Patient reports pain as mild to moderate.  Denies N/V/CP/SOB. Still requiring O2  Objective:   VITALS:   Vitals:   07/15/19 0555 07/15/19 1319 07/15/19 2042 07/16/19 0514  BP: 140/71 129/66 130/72 (!) 120/51  Pulse: 96  86 72  Resp: 18 18 17 18   Temp: 98.9 F (37.2 C) 98.4 F (36.9 C) 99.8 F (37.7 C) 98 F (36.7 C)  TempSrc: Oral Oral Oral Oral  SpO2: 97% 92%  96%  Weight:    86.2 kg  Height:        NAD ABD soft Sensation intact distally Intact pulses distally Dorsiflexion/Plantar flexion intact Incision: dressing C/D/I Compartment soft   Lab Results  Component Value Date   WBC 7.7 07/16/2019   HGB 7.8 (L) 07/16/2019   HCT 23.6 (L) 07/16/2019   MCV 92.5 07/16/2019   PLT 142 (L) 07/16/2019   BMET    Component Value Date/Time   NA 135 07/14/2019 0503   NA 144 07/09/2017 0850   K 5.1 07/14/2019 0503   CL 101 07/14/2019 0503   CO2 24 07/14/2019 0503   GLUCOSE 132 (H) 07/14/2019 0503   BUN 14 07/14/2019 0503   BUN 8 07/09/2017 0850   CREATININE 0.66 07/14/2019 0503   CALCIUM 8.4 (L) 07/14/2019 0503   GFRNONAA >60 07/14/2019 0503   GFRAA >60 07/14/2019 0503     Assessment/Plan: 3 Days Post-Op   Principal Problem:   Closed intertrochanteric fracture of right hip (HCC) Active Problems:   CAD in native artery   Ischemic cardiomyopathy   Hyperlipemia   Osteoporosis   Closed right hip fracture (HCC)   WBAT with walker DVT ppx: Lovenox, SCDs, TEDS PO pain control PT/OT CT chest pending Dispo: Follow up in office in 2 weeks. Will change DVT prophylaxis to Aspirin upon discharge    [place] 07/16/2019, 9:37 AM   Rod Can, MD [phone] [facility] is now [facility]  [facility] [address] [place], [place], [place] [address] Phone: [phone] [email or web address] Facebook  [employer]

## 2019-07-16 NOTE — Progress Notes (Signed)
Attempted to wean patient to room air oxygen level dropped to 78-80% patient denies shortness of breath. Reapplied oxygen @ 2 liters oxygen level up to 88 had to increase oxygen level to 4 liters via nasal cannula now O2 sat 96%. Dr. Tawanna Solo on unit and updated.

## 2019-07-16 NOTE — Progress Notes (Signed)
Physical Therapy Treatment Patient Details Name: Carrie Barnes MRN: [id number] DOB: July 26, 1936 Today's Date: 07/16/2019    History of Present Illness 83 yo female admitted to ED on 11/30 for fall while standing on a step ladder hanging a shower curtain. Pt with R intertrochanteric femur fracture, s/p IM nail on 12/2. PMH includes CAD with history of MI and stenting, DJD, HLD, HTN, ischemic cardiomyopathy, osteoporosis.    PT Comments    POD # 3 Pt was OOB via nursing on 4 lts nasal due to low RA sats.  Chest CT neg for PE but shows 3 LEFT rib Fx (9,10,11) "That's why my side hurts".  Instructed pt on deep breathing exercises, coughing and use of Spirometer to she can wean O2.  Assisted out of recliner to amb to bathroom. General transfer comment: 25% VC's on proper hand placement and safety with turns.  Also assisted with toilet transfer.  General Gait Details: 25% VC's on proper walker to self distance and safety withn turns.  Limited distance to and from bathroom due to Max c/o fatigue.  RA avg 84%.  Hgb 7.8   Positioned in recliner and performed AP, knee presses and Gluteal squeezes followed by ICE.  Session ceased due to fatigue.  Pt awaiting one unit of blood.    Follow Up Recommendations  Home health PT;Supervision/Assistance - 24 hour     Equipment Recommendations  Rolling walker with 5" wheels;3in1 (PT)    Recommendations for Other Services       Precautions / Restrictions Precautions Precautions: Fall Precaution Comments: 3 L Rib Fx Restrictions Weight Bearing Restrictions: No RLE Weight Bearing: Weight bearing as tolerated    Mobility  Bed Mobility               General bed mobility comments: OOB in recliner  Transfers Overall transfer level: Needs assistance Equipment used: Rolling walker (2 wheeled) Transfers: Sit to/from [employer] Sit to Stand: Min guard;Min assist Stand pivot transfers: Min assist       General transfer  comment: 25% VC's on proper hand placement and safety with turns.  Also assisted with toilet transfer.  Ambulation/Gait Ambulation/Gait assistance: Min guard;Min assist Gait Distance (Feet): 16 Feet(8 feet x 2 to and from bathroom) Assistive device: Rolling walker (2 wheeled) Gait Pattern/deviations: Decreased stride length;Step-to pattern;Decreased stance time - right;Antalgic Gait velocity: decreased   General Gait Details: 25% VC's on proper walker to self distance and safety withn turns.  Limited distance to and from bathroom due to Max c/o fatigue.  RA avg 84%.  Hgb 7.8   Stairs             Wheelchair Mobility    Modified Rankin (Stroke Patients Only)       Balance                                            Cognition Arousal/Alertness: Awake/alert Behavior During Therapy: WFL for tasks assessed/performed Overall Cognitive Status: Within Functional Limits for tasks assessed                                 General Comments: very motivated, strong, indep lady      Exercises  10 reps AP, knee presses and Gluteal squeezes.    General Comments  Pertinent Vitals/Pain Pain Assessment: 0-10 Pain Score: 5  Pain Location: R hip and Lside Pain Descriptors / Indicators: Sore;Discomfort;Grimacing Pain Intervention(s): Monitored during session;Premedicated before session;Repositioned;Ice applied    Home Living                      Prior Function            PT Goals (current goals can now be found in the care plan section)      Frequency    Min 5X/week      PT Plan Current plan remains appropriate    Co-evaluation              AM-PAC PT "6 Clicks" Mobility   Outcome Measure  Help needed turning from your back to your side while in a flat bed without using bedrails?: A Little Help needed moving from lying on your back to sitting on the side of a flat bed without using bedrails?: A Little Help  needed moving to and from a bed to a chair (including a wheelchair)?: A Little Help needed standing up from a chair using your arms (e.g., wheelchair or bedside chair)?: A Little Help needed to walk in hospital room?: A Little Help needed climbing 3-5 steps with a railing? : A Lot 6 Click Score: 17    End of Session Equipment Utilized During Treatment: Gait belt;Oxygen Activity Tolerance: Patient limited by fatigue Patient left: in chair;with chair alarm set;with call bell/phone within reach Nurse Communication: Mobility status PT Visit Diagnosis: History of falling (Z91.81);Muscle weakness (generalized) (M62.81);Difficulty in walking, not elsewhere classified (R26.2)     Time: 1340-1420 PT Time Calculation (min) (ACUTE ONLY): 40 min  Charges:  $Gait Training: 23-37 mins $Therapeutic Activity: 8-22 mins                     {Brei Pociask  PTA Acute  Rehabilitation Services Pager      [phone] Office      [phone]

## 2019-07-17 LAB — CBC WITH DIFFERENTIAL/PLATELET
Abs Immature Granulocytes: 0.05 10*3/uL (ref 0.00–0.07)
Basophils Absolute: 0 10*3/uL (ref 0.0–0.1)
Basophils Relative: 1 %
Eosinophils Absolute: 0.1 10*3/uL (ref 0.0–0.5)
Eosinophils Relative: 1 %
HCT: 25.1 % — ABNORMAL LOW (ref 36.0–46.0)
Hemoglobin: 8.2 g/dL — ABNORMAL LOW (ref 12.0–15.0)
Immature Granulocytes: 1 %
Lymphocytes Relative: 24 %
Lymphs Abs: 1.5 10*3/uL (ref 0.7–4.0)
MCH: 30.3 pg (ref 26.0–34.0)
MCHC: 32.7 g/dL (ref 30.0–36.0)
MCV: 92.6 fL (ref 80.0–100.0)
Monocytes Absolute: 0.7 10*3/uL (ref 0.1–1.0)
Monocytes Relative: 10 %
Neutro Abs: 4.1 10*3/uL (ref 1.7–7.7)
Neutrophils Relative %: 63 %
Platelets: 141 10*3/uL — ABNORMAL LOW (ref 150–400)
RBC: 2.71 MIL/uL — ABNORMAL LOW (ref 3.87–5.11)
RDW: 12.6 % (ref 11.5–15.5)
WBC: 6.4 10*3/uL (ref 4.0–10.5)
nRBC: 0.3 % — ABNORMAL HIGH (ref 0.0–0.2)

## 2019-07-17 NOTE — Progress Notes (Signed)
SATURATION QUALIFICATIONS: (This note is used to comply with regulatory documentation for home oxygen)  Patient Saturations on Room Air at Rest = 89%  Patient Saturations on Room Air while Ambulating = 85%  Patient Saturations on 2 Liters of oxygen while Ambulating = 94%  Please briefly explain why patient needs home oxygen: Patient unable to maintain adequate oxygenation on room air and while ambulating

## 2019-07-17 NOTE — Progress Notes (Signed)
PROGRESS NOTE    Carrie Barnes  XTK:[id number] DOB: Jan 02, 1936 DOA: 07/11/2019 PCP: Renford Dills, MD   Brief Narrative:  Patient is 83 year old female with history of coronary disease, ischemic cardiomyopathy, hypertension, hyperlipidemia, spinal stenosis, osteoporosis on Fosamax who presented after a mechanical fall when she landed on the right side while hanging shower curtain.  No loss of consciousness or trauma to the head.  Imaging done in the emergency department showed closed intertrochanteric fracture of right hip.  Orthopedics consulted and she underwent  ORIF on 07/13/19.  PT/OT recommended home health PT. Not progressed enough with PT to be safe at home.  She is still hypoxic due to fractures of the ribs on her left side and possible underlying atelectasis.  She was also  transfused  with a unit of PRBC.  Respiratory status slowly improving.  We will monitor her on room air today.  Plan for discharge to home tomorrow  Assessment & Plan:   Principal Problem:   Closed intertrochanteric fracture of right hip (HCC) Active Problems:   CAD in native artery   Ischemic cardiomyopathy   Hyperlipemia   Osteoporosis   Closed right hip fracture (HCC)   Closed intertrochanteric fracture of the right hip: Pain is well controlled at present.  Orthopedics  Following.S/p ORIF .  Continue pain management.  DVT prophylaxis with lovenox.  PT/OT recommended home health.Dvt ppx will be changed to aspirin on DC.  Acute hypoxic respiratory failure: Most likely secondary to atelectasis.  She has fractures of 3 ribs on her left side.  CT chest did not show any pulmonary embolism but showed fractures, atelectasis.  Continue incentive spirometer.We will try to wean the oxygen.  Right arm pain: Much better today.X-ray did not show any fracture or dislocation.  History of coronary artery disease/ischemic cardiomyopathy: No symptoms at present.  Denies any chest pain.  On aspirin, statin and  beta-blockers which we will continue.  Echocardiogram showed normal left ventricle ejection fraction, moderately elevated pulmonary artery pressure. She has history of PCI to the LAD in 2018.  Osteoporosis: on Fosamax at home.  Vitamin D level normal.  Hyperlipidemia: Continue statin  Normocytic anemia: Hb dropped after  surgery.No obvious  hematoma on the surgical site.  S/P transfusion with a unit of PRBC. Hb today is 8.2.         DVT prophylaxis: Lovenox Code Status: Full Family Communication: Discussed with daughter on phone on 07/16/19 Disposition Plan: Home with home health tomorrow   Consultants: Orthopedics  Procedures: None  Antimicrobials:  Anti-infectives (From admission, onward)   Start     Dose/Rate Route Frequency Ordered Stop   07/13/19 2200  ceFAZolin (ANCEF) IVPB 1 g/50 mL premix     1 g 100 mL/hr over 30 Minutes Intravenous Every 6 hours 07/13/19 2045 07/14/19 0421   07/13/19 1515  ceFAZolin (ANCEF) IVPB 2g/100 mL premix     2 g 200 mL/hr over 30 Minutes Intravenous On call to O.R. 07/13/19 1456 07/13/19 1651   07/13/19 1500  ceFAZolin (ANCEF) IVPB 2g/100 mL premix  Status:  Discontinued     2 g 200 mL/hr over 30 Minutes Intravenous  Once 07/13/19 1455 07/13/19 1504   07/13/19 1423  ceFAZolin (ANCEF) 2-4 GM/100ML-% IVPB    Note to Pharmacy: Vevelyn Royals  : cabinet override      07/13/19 1423 07/13/19 1621      Subjective: Patient seen and examined the bedside this morning.  Appears much better today.  More comfortable.  She desaturated  on ambulation while working with physical therapy but maintains her saturation on room air.  We decided to monitor 1 more night.  We will keep her on room air.  Plan for discharge tomorrow.  Objective: Vitals:   07/16/19 2036 07/17/19 0443 07/17/19 1010 07/17/19 1138  BP: (!) 133/57 125/62 (!) 138/54   Pulse: 84 79 88   Resp: 18 18    Temp: 99.2 F (37.3 C) 98.2 F (36.8 C)    TempSrc: Oral Oral    SpO2: 99%  99% 96% 92%  Weight:      Height:        Intake/Output Summary (Last 24 hours) at 07/17/2019 1302 Last data filed at 07/17/2019 0900 Gross per 24 hour  Intake 736.17 ml  Output 250 ml  Net 486.17 ml   Filed Weights   07/11/19 1537 07/16/19 0514  Weight: 76.2 kg 86.2 kg    Examination:  General exam: Not in distress, obese  HEENT:PERRL, Ear/Nose normal on gross exam Respiratory system: Bilateral diminished breath sounds on the bases,  no wheezes or crackles  Cardiovascular system: S1 & S2 heard, RRR. No JVD, murmurs, rubs, gallops or clicks. No pedal edema. Gastrointestinal system: Abdomen is nondistended, soft and nontender. No organomegaly or masses felt. Normal bowel sounds heard. Central nervous system: Alert and oriented. No focal neurological deficits. Extremities: Trace edema in bilateral hands, no clubbing ,no cyanosis, decreased range of motion on the right hip, tenderness in the right hip, clean surgical wound on the right hip skin: No rashes, lesions or ulcers,no icterus ,no pallor    Data Reviewed: I have personally reviewed following labs and imaging studies  CBC: Recent Labs  Lab 07/11/19 2035 07/12/19 0447 07/14/19 0503 07/15/19 0607 07/16/19 0433 07/17/19 0950  WBC 12.6* 8.0 11.0* 11.1* 7.7 6.4  NEUTROABS 10.3*  --  8.5*  --   --  4.1  HGB 12.2 10.1* 9.3* 8.7* 7.8* 8.2*  HCT 37.7 30.7* 27.4* 25.2* 23.6* 25.1*  MCV 95.0 91.9 88.1 88.1 92.5 92.6  PLT 102* 145* 148* 162 142* 381*   Basic Metabolic Panel: Recent Labs  Lab 07/11/19 1604 07/12/19 0447 07/14/19 0503  NA 139 137 135  K 4.3 4.3 5.1  CL 104 101 101  CO2 24 27 24   GLUCOSE 140* 133* 132*  BUN 15 15 14   CREATININE 0.89 0.80 0.66  CALCIUM 9.2 8.7* 8.4*   GFR: Estimated Creatinine Clearance: 56.2 mL/min (by C-G formula based on SCr of 0.66 mg/dL). Liver Function Tests: No results for input(s): AST, ALT, ALKPHOS, BILITOT, PROT, ALBUMIN in the last 168 hours. No results for input(s):  LIPASE, AMYLASE in the last 168 hours. No results for input(s): AMMONIA in the last 168 hours. Coagulation Profile: Recent Labs  Lab 07/11/19 1604  INR 1.0   Cardiac Enzymes: No results for input(s): CKTOTAL, CKMB, CKMBINDEX, TROPONINI in the last 168 hours. BNP (last 3 results) No results for input(s): PROBNP in the last 8760 hours. HbA1C: No results for input(s): HGBA1C in the last 72 hours. CBG: No results for input(s): GLUCAP in the last 168 hours. Lipid Profile: No results for input(s): CHOL, HDL, LDLCALC, TRIG, CHOLHDL, LDLDIRECT in the last 72 hours. Thyroid Function Tests: No results for input(s): TSH, T4TOTAL, FREET4, T3FREE, THYROIDAB in the last 72 hours. Anemia Panel: No results for input(s): VITAMINB12, FOLATE, FERRITIN, TIBC, IRON, RETICCTPCT in the last 72 hours. Sepsis Labs: No results for input(s): PROCALCITON, LATICACIDVEN in the last 168 hours.  Recent Results (from the past 240 hour(s))  SARS CORONAVIRUS 2 (TAT 6-24 HRS) Nasopharyngeal Nasopharyngeal Swab     Status: None   Collection Time: 07/11/19  7:03 PM   Specimen: Nasopharyngeal Swab  Result Value Ref Range Status   SARS Coronavirus 2 NEGATIVE NEGATIVE Final    Comment: (NOTE) SARS-CoV-2 target nucleic acids are NOT DETECTED. The SARS-CoV-2 RNA is generally detectable in upper and lower respiratory specimens during the acute phase of infection. Negative results do not preclude SARS-CoV-2 infection, do not rule out co-infections with other pathogens, and should not be used as the sole basis for treatment or other patient management decisions. Negative results must be combined with clinical observations, patient history, and epidemiological information. The expected result is Negative. Fact Sheet for Patients: HairSlick.no Fact Sheet for Healthcare Providers: quierodirigir.com This test is not yet approved or cleared by the [place] FDA and    has been authorized for detection and/or diagnosis of SARS-CoV-2 by FDA under an Emergency Use Authorization (EUA). This EUA will remain  in effect (meaning this test can be used) for the duration of the COVID-19 declaration under Section 56 4(b)(1) of the Act, 21 U.S.C. section 360bbb-3(b)(1), unless the authorization is terminated or revoked sooner. Performed at [facility], [address], [place], [place] [address]   MRSA PCR Screening     Status: None   Collection Time: 07/12/19 10:48 AM   Specimen: Nasal Mucosa; Nasopharyngeal  Result Value Ref Range Status   MRSA by PCR NEGATIVE NEGATIVE Final    Comment:        The GeneXpert MRSA Assay (FDA approved for NASAL specimens only), is one component of a comprehensive MRSA colonization surveillance program. It is not intended to diagnose MRSA infection nor to guide or monitor treatment for MRSA infections. Performed at [facility], [address], [place], [place] [address]          Radiology Studies: Ct Angio Chest Pe W Or Wo Contrast  Addendum Date: 07/16/2019   ADDENDUM REPORT: 07/16/2019 12:07 ADDENDUM: These results were called by telephone at the time of interpretation on 07/16/2019 at 12:06 pm to provider Jamesetta Greenhalgh [facility] , who verbally acknowledged these results. Electronically Signed   By: Romona Curls M.D.   On: 07/16/2019 12:07   Result Date: 07/16/2019 CLINICAL DATA:  Hypoxemia EXAM: CT ANGIOGRAPHY CHEST WITH CONTRAST TECHNIQUE: Multidetector CT imaging of the chest was performed using the standard protocol during bolus administration of intravenous contrast. Multiplanar CT image reconstructions and MIPs were obtained to evaluate the vascular anatomy. CONTRAST:  OMNIPAQUE IOHEXOL 350 MG/ML SOLN COMPARISON:  Chest radiograph dated 07/11/2019 FINDINGS: Cardiovascular: Satisfactory opacification of the pulmonary arteries to the segmental level. No evidence of pulmonary embolism.  Vascular calcifications are seen in the coronary arteries and aortic arch. The heart is enlarged. No pericardial effusion. Mediastinum/Nodes: No enlarged mediastinal, hilar, or axillary lymph nodes. Thyroid gland, trachea, and esophagus demonstrate no significant findings. Lungs/Pleura: There is a small to moderate left and trace right pleural effusion with associated atelectasis. There is also mild right middle lobe atelectasis. There is no pneumothorax. Upper Abdomen: No acute abnormality. Musculoskeletal: There are acute fractures of the posterolateral ninth, tenth, and eleventh ribs. Review of the MIP images confirms the above findings. IMPRESSION: 1. No acute pulmonary embolism. 2. Acute fractures of the left posterolateral ninth, tenth, and eleventh ribs. 3. Small to moderate left and trace right pleural effusions with associated atelectasis. 4. Cardiomegaly. Aortic Atherosclerosis (  ICD10-I70.0). Electronically Signed: By: Romona Curlsyler  Litton M.D. On: 07/16/2019 11:59        Scheduled Meds:  atorvastatin  80 mg Oral Daily   docusate sodium  100 mg Oral BID   enoxaparin (LOVENOX) injection  40 mg Subcutaneous Q24H   loratadine  10 mg Oral Daily   metoprolol succinate  50 mg Oral Daily   pantoprazole  40 mg Oral Daily   Continuous Infusions:  methocarbamol (ROBAXIN) IV     methocarbamol (ROBAXIN) IV       LOS: 6 days    Time spent: 35 mins.More than 50% of that time was spent in counseling and/or coordination of care.      Burnadette PopAmrit Kelina Beauchamp, MD [facility] Pager [phone]  If 7PM-7AM, please contact night-coverage [email or web address] Password [email or web address] 07/17/2019, 1:02 PM

## 2019-07-17 NOTE — Progress Notes (Addendum)
Patient resting in chair oxygen level 80% on room air. Encouraged patient to take deep breaths.  Oxygen increased to 85%. Assisted patient back to bed on air room oxygen level remained 86%-87%. Patient complaining of left rib pain and feeling tired. Patient oxygen level back to 90% after approximately 7 minutes.  Oxygen level continues to drop intermittently placed patient back on nasal canula at 2 liters oxygen level 94 %

## 2019-07-17 NOTE — Progress Notes (Signed)
Physical Therapy Treatment Patient Details Name: Carrie Barnes MRN: [id number] DOB: [birth year] Today's Date: 07/17/2019    History of Present Illness 83 yo female admitted to ED on 11/30 for fall while standing on a step ladder hanging a shower curtain. Pt with R intertrochanteric femur fracture, s/p IM nail on 12/2. Pt additionally with R rib fractures. PMH includes CAD with history of MI and stenting, DJD, HLD, HTN, ischemic cardiomyopathy, osteoporosis.    PT Comments    Pt with improved tolerance for ambulation today, ambulating 2x15 ft with RW. Pt with sats 85-96% on RA during session, and when sats dropped <88%, pt able to recover sats >90% with breathing technique and rest. PT updated equipment recommendations to reflect rollator, as pt with difficulty with activity tolerance at this time. PT to continue to follow acutely, will progress mobility as able.     Follow Up Recommendations  Home health PT;Supervision/Assistance - 24 hour     Equipment Recommendations  3in1 (PT);Other (comment)(rollator)    Recommendations for Other Services       Precautions / Restrictions Precautions Precautions: Fall Precaution Comments: 3 L Rib Fx Restrictions Weight Bearing Restrictions: No RLE Weight Bearing: Weight bearing as tolerated    Mobility  Bed Mobility Overal bed mobility: Needs Assistance Bed Mobility: Supine to Sit     Supine to sit: HOB elevated;Min assist     General bed mobility comments: Min assist for RLE lifting and translation to EOB, pt with use of bedrails to pull to sit.  Transfers Overall transfer level: Needs assistance Equipment used: Rolling walker (2 wheeled) Transfers: Sit to/from [employer] Sit to Stand: Min assist;Min guard Stand pivot transfers: Min guard;From elevated surface       General transfer comment: Min guard to min assist for sit to stand for power up, pt with increased time to self steady. Sit to stand x3,  from bed, BSC, and recliner in hallway. Verbal cuing for hand placement when rising. Min guard for stand pivot to [facility], with verbal cuing to guide pt to [facility] and slow lowering/hand placement.  Ambulation/Gait Ambulation/Gait assistance: Min guard;+2 safety/equipment(chair follow) Gait Distance (Feet): 30 Feet(2x15 ft) Assistive device: Rolling walker (2 wheeled) Gait Pattern/deviations: Decreased stride length;Step-to pattern;Decreased stance time - right;Antalgic;Step-through pattern Gait velocity: decr   General Gait Details: Min guard for safety, verbal cuing for upright posture x2 and sequencing with RLE leading. Pt required seated rest break at 15 ft ambulation due to fatigue and DOE 2/4, SpO2 on RA 86% at lowest with recovery >90% with rest and breathing technique.   Stairs             Wheelchair Mobility    Modified Rankin (Stroke Patients Only)       Balance Overall balance assessment: Needs assistance;History of Falls Sitting-balance support: Feet supported;Single extremity supported Sitting balance-Leahy Scale: Fair     Standing balance support: Bilateral upper extremity supported;During functional activity Standing balance-Leahy Scale: Poor Standing balance comment: reliant on external support in standing                            Cognition Arousal/Alertness: Awake/alert Behavior During Therapy: WFL for tasks assessed/performed Overall Cognitive Status: Within Functional Limits for tasks assessed  Exercises General Exercises - Lower Extremity Ankle Circles/Pumps: AROM;Both;5 reps;Seated Quad Sets: AROM;Right;10 reps;Seated Gluteal Sets: AROM;10 reps;Both;Seated    General Comments        Pertinent Vitals/Pain Pain Assessment: 0-10 Pain Score: 5  Pain Location: R hip and R side Pain Descriptors / Indicators: Sore;Discomfort;Grimacing Pain Intervention(s): Limited activity within  patient's tolerance;Monitored during session;Premedicated before session;Repositioned    Home Living                      Prior Function            PT Goals (current goals can now be found in the care plan section) Acute Rehab PT Goals Patient Stated Goal: get stronger, decrease R sided pain Time For Goal Achievement: 07/28/19 Potential to Achieve Goals: Good Progress towards PT goals: Progressing toward goals    Frequency    Min 5X/week      PT Plan Current plan remains appropriate;Equipment recommendations need to be updated    Co-evaluation PT/OT/SLP Co-Evaluation/Treatment: Yes Reason for Co-Treatment: For patient/therapist safety PT goals addressed during session: Mobility/safety with mobility        AM-PAC PT "6 Clicks" Mobility   Outcome Measure  Help needed turning from your back to your side while in a flat bed without using bedrails?: A Little Help needed moving from lying on your back to sitting on the side of a flat bed without using bedrails?: A Little Help needed moving to and from a bed to a chair (including a wheelchair)?: A Little Help needed standing up from a chair using your arms (e.g., wheelchair or bedside chair)?: A Little Help needed to walk in hospital room?: A Little Help needed climbing 3-5 steps with a railing? : A Lot 6 Click Score: 17    End of Session Equipment Utilized During Treatment: Gait belt Activity Tolerance: Patient limited by fatigue;Patient limited by pain Patient left: in chair;with call bell/phone within reach(pt verbalizes she will press call button and wait for assist prior to mobilizing back to bed) Nurse Communication: Mobility status PT Visit Diagnosis: History of falling (Z91.81);Muscle weakness (generalized) (M62.81);Difficulty in walking, not elsewhere classified (R26.2)     Time: 2694-8546 PT Time Calculation (min) (ACUTE ONLY): 42 min  Charges:  $Gait Training: 8-22 mins                     Carrie Barnes  E, PT [place] Pager [phone]  Office [phone]   Carrie Barnes 07/17/2019, 12:50 PM

## 2019-07-17 NOTE — Plan of Care (Signed)
  Problem: Education: Goal: Required Educational Video(s) Outcome: Progressing   Problem: Clinical Measurements: Goal: Ability to maintain clinical measurements within normal limits will improve Outcome: Progressing Goal: Postoperative complications will be avoided or minimized Outcome: Progressing   Problem: Skin Integrity: Goal: Demonstration of wound healing without infection will improve Outcome: Progressing   Problem: Education: Goal: Knowledge of General Education information will improve Description: Including pain rating scale, medication(s)/side effects and non-pharmacologic comfort measures Outcome: Progressing   

## 2019-07-17 NOTE — Progress Notes (Signed)
Occupational Therapy Treatment Patient Details Name: Carrie Barnes MRN: [id number] DOB: 11-09-1935 Today's Date: 07/17/2019    History of present illness 83 yo female admitted to ED on 11/30 for fall while standing on a step ladder hanging a shower curtain. Pt with R intertrochanteric femur fracture, s/p IM nail on 12/2. Pt additionally with R rib fractures. PMH includes CAD with history of MI and stenting, DJD, HLD, HTN, ischemic cardiomyopathy, osteoporosis.   OT comments  Pt progressing towards OT goals this session. Able to complete toilet transfer at min guard assist with RW. She does continue to require max A for peri care, but able to manage underwear (mesh with pad) in standing after they are brought up to her knees. PT fatigues quickly, with O2 dropping to 85% on RA - Pt is able to bring back up to >90 with seated rest breaks and pursed lip breathing. Pt will need additional education for LB ADL (introduce AE) next session. Current POC remains appropriate. Pt states that her family is planning on providing 24 hour care (which is still essential).   Follow Up Recommendations  Supervision/Assistance - 24 hour;Home health OT    Equipment Recommendations  3 in 1 bedside commode    Recommendations for Other Services      Precautions / Restrictions Precautions Precautions: Fall Precaution Comments: 3 L Rib Fx, IM nail Restrictions Weight Bearing Restrictions: Yes RLE Weight Bearing: Weight bearing as tolerated       Mobility Bed Mobility Overal bed mobility: Needs Assistance Bed Mobility: Supine to Sit     Supine to sit: HOB elevated;Min assist     General bed mobility comments: Min assist for RLE lifting and translation to EOB, pt with use of bedrails to pull to sit.  Transfers Overall transfer level: Needs assistance Equipment used: Rolling walker (2 wheeled) Transfers: Sit to/from [facility] Sit to Stand: Min assist;Min guard Stand pivot  transfers: Min guard;From elevated surface       General transfer comment: Min guard to min assist for sit to stand for power up, pt with increased time to self steady. Sit to stand x3, from bed, BSC, and recliner in hallway. Verbal cuing for hand placement when rising. Min guard for stand pivot to [facility], with verbal cuing to guide pt to [facility] and slow lowering/hand placement.    Balance Overall balance assessment: Needs assistance;History of Falls Sitting-balance support: Feet supported;Single extremity supported Sitting balance-Leahy Scale: Fair     Standing balance support: Bilateral upper extremity supported;During functional activity;No upper extremity supported Standing balance-Leahy Scale: Poor Standing balance comment: reliant on external support; able to maintain breif moments of static standing with no UE support                           ADL either performed or assessed with clinical judgement   ADL Overall ADL's : Needs assistance/impaired     Grooming: Set up;Sitting;Wash/dry hands;Wash/dry face;Brushing hair Grooming Details (indicate cue type and reason): EOB/on BSC     Lower Body Bathing: Moderate assistance Lower Body Bathing Details (indicate cue type and reason): able to get to knees - needs assist below the knees     Lower Body Dressing: Maximal assistance Lower Body Dressing Details (indicate cue type and reason): to assist with socks and pulling up mesh undies Toilet Transfer: Minimal assistance;BSC;RW;Stand-pivot   Toileting- Clothing Manipulation and Hygiene: Maximal assistance;Sit to/from stand Toileting - Clothing Manipulation Details (indicate cue type  and reason): attempted, unable to perform       General ADL Comments: fatigues quickly, multiple reminders for PLB to keep O2 up     Vision       Perception     Praxis      Cognition Arousal/Alertness: Awake/alert Behavior During Therapy: WFL for tasks assessed/performed Overall  Cognitive Status: Within Functional Limits for tasks assessed                                 General Comments: very motivated, strong, indep lady        Exercises Exercises: Other exercises General Exercises - Lower Extremity Ankle Circles/Pumps: AROM;Both;5 reps;Seated Quad Sets: AROM;Right;10 reps;Seated Gluteal Sets: AROM;10 reps;Both;Seated Other Exercises Other Exercises: Incentive Spirometer x10   Shoulder Instructions       General Comments Pt with noted decrease in SpO2 with activity and Pt requires prompting for seated rest breaks, on RA with PLB she is able to bring O2 back up to >90    Pertinent Vitals/ Pain       Pain Assessment: 0-10 Pain Score: 5  Pain Location: R hip and R side Pain Descriptors / Indicators: Sore;Discomfort;Grimacing Pain Intervention(s): Monitored during session;Repositioned  Home Living                                          Prior Functioning/Environment              Frequency  Min 2X/week        Progress Toward Goals  OT Goals(current goals can now be found in the care plan section)  Progress towards OT goals: Progressing toward goals  Acute Rehab OT Goals Patient Stated Goal: get stronger, decrease R sided pain OT Goal Formulation: With patient Time For Goal Achievement: 07/29/19 Potential to Achieve Goals: Good  Plan Discharge plan remains appropriate;Frequency remains appropriate    Co-evaluation    PT/OT/SLP Co-Evaluation/Treatment: Yes Reason for Co-Treatment: For patient/therapist safety;To address functional/ADL transfers PT goals addressed during session: Mobility/safety with mobility;Balance;Proper use of DME OT goals addressed during session: Proper use of [employer] equipment and DME;ADL's and self-care      AM-PAC OT "6 Clicks" Daily Activity     Outcome Measure   Help from another person eating meals?: None Help from another person taking care of personal grooming?: A  Little Help from another person toileting, which includes using toliet, bedpan, or urinal?: A Lot Help from another person bathing (including washing, rinsing, drying)?: A Lot Help from another person to put on and taking off regular upper body clothing?: A Little Help from another person to put on and taking off regular lower body clothing?: A Lot 6 Click Score: 16    End of Session Equipment Utilized During Treatment: Rolling walker;Gait belt  OT Visit Diagnosis: Unsteadiness on feet (R26.81);Muscle weakness (generalized) (M62.81);History of falling (Z91.81)   Activity Tolerance Patient tolerated treatment well   Patient Left in chair;with call bell/phone within reach   Nurse Communication Mobility status        Time: 0093-8182 OT Time Calculation (min): 41 min  Charges: OT General Charges $OT Visit: 1 Visit OT Treatments $Self Care/Home Management : 23-37 mins  Sherryl Manges OTR/L Acute Rehabilitation Services Pager: [phone] Office: [phone]   Evern Bio Amairany Schumpert 07/17/2019, 2:23 PM

## 2019-07-17 NOTE — Progress Notes (Signed)
SATURATION QUALIFICATIONS: (This note is used to comply with regulatory documentation for home oxygen)  Patient Saturations on Room Air at Rest =82%  Patient Saturations on Room Air while Ambulating = 84%  Patient Saturations on 3 Liters of oxygen while Ambulating = 91%  Please briefly explain why patient needs home oxygen:  Pt requires supplemental Oxygen to maintain SpO2 at 90 or higher. She continues to require cues for pursed lip breathing and use of incentive spirometer as well. She is very motivated, but continues to require supplemental support to maintain adequate saturations.   Hulda Humphrey OTR/L Acute Rehabilitation Services Pager: [phone] Office: [phone]

## 2019-07-18 LAB — TYPE AND SCREEN
ABO/RH(D): A POS
Antibody Screen: NEGATIVE
Unit division: 0

## 2019-07-18 LAB — BPAM RBC
Blood Product Expiration Date: 202012222359
ISSUE DATE / TIME: 202012051633
Unit Type and Rh: 6200

## 2019-07-18 NOTE — Progress Notes (Signed)
Physical Therapy Treatment Patient Details Name: Carrie Barnes MRN: [id number] DOB: [birth year] Today's Date: 07/18/2019    History of Present Illness 83 yo female admitted to ED on 11/30 for fall while standing on a step ladder hanging a shower curtain. Pt with R intertrochanteric femur fracture, s/p IM nail on 12/2. Pt additionally with R rib fractures. PMH includes CAD with history of MI and stenting, DJD, HLD, HTN, ischemic cardiomyopathy, osteoporosis.    PT Comments    Pt assisted with ambulating in hallway.  Pt continues to fatigue quickly so recommended placing chairs with armrests (if possible) around home for pt to take breaks.  RW provides more stability for pt so recommend RW (not rollator) and pt reports understanding and agreeable. Daughter ambulated with pt today holding gait belt.  Pt requiring supplemental oxygen for physical activities.  Pt plans to f/u with HHPT.    Follow Up Recommendations  Home health PT;Supervision/Assistance - 24 hour     Equipment Recommendations  Rolling walker with 5" wheels;3in1 (PT)    Recommendations for Other Services       Precautions / Restrictions Precautions Precautions: Fall Precaution Comments: Rib Fxs Restrictions Weight Bearing Restrictions: Yes RLE Weight Bearing: Weight bearing as tolerated    Mobility  Bed Mobility Overal bed mobility: Needs Assistance Bed Mobility: Sit to Supine       Sit to supine: Min assist   General bed mobility comments: educated and daughter performed assisting R LE  Transfers Overall transfer level: Needs assistance Equipment used: Rolling walker (2 wheeled) Transfers: Sit to/from Stand Sit to Stand: Min guard         General transfer comment: pt able to recall safe technique, had daughter holding gait belt  Ambulation/Gait Ambulation/Gait assistance: Min guard Gait Distance (Feet): 20 Feet(x2) Assistive device: Rolling walker (2 wheeled) Gait Pattern/deviations:  Decreased stride length;Step-to pattern;Antalgic Gait velocity: decr   General Gait Details: verbal cues for sequence, and posture; daughter stayed with pt holding gait belt; pt required one seated rest break due to fatigue, SPO2 dropped to 83% on room air   Stairs             Wheelchair Mobility    Modified Rankin (Stroke Patients Only)       Balance                                            Cognition Arousal/Alertness: Awake/alert Behavior During Therapy: WFL for tasks assessed/performed Overall Cognitive Status: Within Functional Limits for tasks assessed                                        Exercises      General Comments        Pertinent Vitals/Pain Pain Assessment: 0-10 Pain Score: 4  Pain Location: R hip and R side Pain Descriptors / Indicators: Sore;Discomfort;Grimacing Pain Intervention(s): Monitored during session;Repositioned    Home Living                      Prior Function            PT Goals (current goals can now be found in the care plan section) Progress towards PT goals: Progressing toward goals    Frequency    Min 5X/week  PT Plan Current plan remains appropriate    Co-evaluation              AM-PAC PT "6 Clicks" Mobility   Outcome Measure  Help needed turning from your back to your side while in a flat bed without using bedrails?: A Little Help needed moving from lying on your back to sitting on the side of a flat bed without using bedrails?: A Little Help needed moving to and from a bed to a chair (including a wheelchair)?: A Little Help needed standing up from a chair using your arms (e.g., wheelchair or bedside chair)?: A Little Help needed to walk in hospital room?: A Little Help needed climbing 3-5 steps with a railing? : A Lot 6 Click Score: 17    End of Session Equipment Utilized During Treatment: Gait belt Activity Tolerance: Patient limited by  fatigue;Patient limited by pain Patient left: with call bell/phone within reach;in bed;with family/visitor present Nurse Communication: Mobility status PT Visit Diagnosis: History of falling (Z91.81);Muscle weakness (generalized) (M62.81);Difficulty in walking, not elsewhere classified (R26.2)     Time: 2876-8115 PT Time Calculation (min) (ACUTE ONLY): 23 min  Charges:  $Gait Training: 23-37 mins                     Carmelia Bake, PT, DPT Acute Rehabilitation Services Office: [phone] Pager: [phone]  Trena Platt 07/18/2019, 12:15 PM

## 2019-07-18 NOTE — TOC Transition Note (Signed)
Transition of Care [facility]) - CM/SW Discharge Note   Patient Details  Name: Vernona Peake MRN: [id number] Date of Birth: Dec 26, 1935  [facility] of [facility]) CM/SW Contact:  Dessa Phi, RN Phone Number: 07/18/2019, 12:48 PM   Clinical Narrative:  Elisabeth Most aware of d/c. Ptatient already has rw,3n1. Noted 02 sats-await dx, & home 02 0rder-[employer] rep Thedore Mins will deliver home 02 to rm prior d/c. No further CM needs.     Final next level of care: [place] Barriers to Discharge: No Barriers Identified   Patient Goals and CMS Choice Patient states their goals for this hospitalization and ongoing recovery are:: go home CMS Medicare.gov Compare Post Acute Care list provided to:: Patient Represenative (must comment) Choice offered to / list presented to : Patient, Adult Children  Discharge Placement                       Discharge Plan and Services   Discharge Planning Services: CM Consult Post Acute Care Choice: Home Health          DME Arranged: Oxygen DME Agency: [employer] Date DME Agency Contacted: 07/18/19 Time DME Agency Contacted: 1224 Representative spoke with at DME Agency: [place]: PT [place]: [facility] [employer]     Representative spoke with at [place]: [place] ([place]) Interventions     Readmission Risk Interventions No flowsheet data found.

## 2019-07-18 NOTE — TOC Progression Note (Signed)
Transition of Care [facility]) - Progression Note    Patient Details  Name: Carrie Barnes MRN: [id number] Date of Birth: 10-Feb-1936  [facility]) CM/SW Contact  Wrigley Winborne, Juliann Pulse, RN Phone Number: 07/18/2019, 11:48 AM  Clinical Narrative:  Awaiting 02 sats to determine if qualifies for home 02-Nsg following.     Expected Discharge Plan: [place] Barriers to Discharge: Continued Medical Work up  Expected Discharge Plan and Services Expected Discharge Plan: [place]   Discharge Planning Services: CM Consult Post Acute Care Choice: [place] arrangements for the past 2 months: Apartment                           HH Arranged: PT, OT [place] Agency: [facility] [employer] [facility] [employer]     Representative spoke with at [place]: [place] ([place]) Interventions    Readmission Risk Interventions No flowsheet data found.

## 2019-07-18 NOTE — Progress Notes (Signed)
Patient assisted to [facility], got up from bed with assist and walker, did very well. Then she transferred to the chair to sit up for a while, legs elevated. O2 level never decreased. O2 decreased to 1.5L via [place] from 2L. She also had a BM.

## 2019-07-18 NOTE — Care Management Important Message (Signed)
Important Message  Patient Details IM Letter given to Dessa Phi RN to present to the Patient Name: Carrie Barnes MRN: [id number] Date of Birth: Jun 03, 1936   Medicare Important Message Given:  Yes     Kerin Salen 07/18/2019, 12:30 PM

## 2019-07-18 NOTE — Progress Notes (Signed)
Occupational Therapy Treatment Patient Details Name: Carrie Barnes MRN: [id number] DOB: 1936/07/28 Today's Date: 07/18/2019    History of present illness 83 yo female admitted to ED on 11/30 for fall while standing on a step ladder hanging a shower curtain. Pt with R intertrochanteric femur fracture, s/p IM nail on 12/2. Pt additionally with R rib fractures. PMH includes CAD with history of MI and stenting, DJD, HLD, HTN, ischemic cardiomyopathy, osteoporosis.   OT comments  Educated pt on daughter on need for bed rail to increase I with bed mobility. Daugther will obtain bed rail as well as a reacher   Follow Up Recommendations  Supervision/Assistance - 24 hour;Home health OT    Equipment Recommendations  3 in 1 bedside commode    Recommendations for Other Services      Precautions / Restrictions Precautions Precautions: Fall Precaution Comments: Rib Fxs Restrictions RLE Weight Bearing: Weight bearing as tolerated       Mobility Bed Mobility Overal bed mobility: Needs Assistance Bed Mobility: Supine to Sit     Supine to sit: Min assist Sit to supine: Min assist   General bed mobility comments: educated and daughter performed assisting R LE  Transfers Overall transfer level: Needs assistance Equipment used: Rolling walker (2 wheeled) Transfers: Sit to/from [employer] to Stand: Min guard Stand pivot transfers: Min guard       General transfer comment: pt able to recall safe technique, had daughter holding gait belt    Balance Overall balance assessment: Needs assistance;History of Falls Sitting-balance support: Feet supported;Single extremity supported Sitting balance-Leahy Scale: Fair     Standing balance support: Bilateral upper extremity supported;During functional activity;No upper extremity supported Standing balance-Leahy Scale: Poor Standing balance comment: reliant on external support; able to maintain breif moments of  static standing with no UE support                           ADL either performed or assessed with clinical judgement   ADL Overall ADL's : Needs assistance/impaired       Grooming Details (indicate cue type and reason): EOB/on BSC Upper Body Bathing: Set up;Sitting   Lower Body Bathing: Minimal assistance;Sit to/from stand;Cueing for safety;Cueing for sequencing       Lower Body Dressing: Moderate assistance;Cueing for compensatory techniques;Sitting/lateral leans;Sit to/from stand;With caregiver independent assisting;Cueing for safety;Cueing for sequencing Lower Body Dressing Details (indicate cue type and reason): pt will obtain reacher Toilet Transfer: Minimal assistance;BSC;RW;Stand-pivot   Toileting- Clothing Manipulation and Hygiene: Maximal assistance;Sit to/from stand Toileting - Clothing Manipulation Details (indicate cue type and reason): attempted, unable to perform     Functional mobility during ADLs: Minimal assistance General ADL Comments: reccomend walker bag     Vision Patient Visual Report: No change from baseline            Cognition Arousal/Alertness: Awake/alert Behavior During Therapy: WFL for tasks assessed/performed Overall Cognitive Status: Within Functional Limits for tasks assessed                                                     Pertinent Vitals/ Pain       Pain Assessment: 0-10 Pain Score: 2  Pain Location: R hip and R side Pain Descriptors / Indicators: Sore;Discomfort;Grimacing Pain Intervention(s): Limited activity within patient's tolerance;Monitored  during session      Frequency  Min 2X/week        Progress Toward Goals  OT Goals(current goals can now be found in the care plan section)  Progress towards OT goals: Progressing toward goals  Acute Rehab OT Goals Patient Stated Goal: get stronger, decrease R sided pain OT Goal Formulation: With patient Time For Goal Achievement:  07/29/19 Potential to Achieve Goals: Good  Plan Discharge plan remains appropriate;Frequency remains appropriate       AM-PAC OT "6 Clicks" Daily Activity     Outcome Measure   Help from another person eating meals?: None Help from another person taking care of personal grooming?: A Little Help from another person toileting, which includes using toliet, bedpan, or urinal?: A Little Help from another person bathing (including washing, rinsing, drying)?: A Little Help from another person to put on and taking off regular upper body clothing?: A Little Help from another person to put on and taking off regular lower body clothing?: A Little 6 Click Score: 19    End of Session Equipment Utilized During Treatment: Rolling walker;Gait belt  OT Visit Diagnosis: Unsteadiness on feet (R26.81);Muscle weakness (generalized) (M62.81);History of falling (Z91.81)   Activity Tolerance Patient tolerated treatment well   Patient Left in chair;with call bell/phone within reach   Nurse Communication Mobility status        Time: 0204-0236 OT Time Calculation (min): 32 min  Charges: OT General Charges $OT Visit: 1 Visit OT Treatments $Self Care/Home Management : 23-37 mins  Kari Baars, [place] [phone] Office- [phone]      Ardon Franklin, Edwena Felty D 07/18/2019, 3:58 PM

## 2019-07-18 NOTE — Progress Notes (Signed)
Discharge instructions explained to patient and her daughter. Patient had O2 tanks and bedside commode delivered to her room. She was discharged wearing 2L O2 via nasal canula and was instructed to wear it continuously until she follows up with her PCP and he will instruct her if she needs to continue. Prescriptions were called in to patient's pharmacy.

## 2019-07-18 NOTE — Discharge Summary (Signed)
Physician Discharge Summary  Elizabeth SauerBarbara Friesland Rathbun ZOX:[id number] DOB: 06/22/1936 DOA: 07/11/2019  PCP: Renford DillsPolite, Ronald, MD  Admit date: 07/11/2019 Discharge date: 07/18/2019  Admitted From: Home Disposition:  Home  Discharge Condition:Stable CODE STATUS:FULL Diet recommendation: Heart Healthy   Brief/Interim Summary: Patient is 83 year old female with history of coronary disease, ischemic cardiomyopathy, hypertension, hyperlipidemia, spinal stenosis, osteoporosis on Fosamax who presented after a mechanical fall when she landed on the right side while hanging shower curtain.  No loss of consciousness or trauma to the head.  Imaging done in the emergency department showed closed intertrochanteric fracture of right hip.  Orthopedics consulted and she underwent  ORIF on 07/13/19.  PT/OT recommended home health PT. Not progressed enough with PT to be safe at home.  She is still hypoxic due to fractures of the ribs on her left side and possible underlying atelectasis.  She was also  transfused  with a unit of PRBC.  Respiratory status slowly improving.  She qualified for home oxygen.  Plan for discharge to home today with home health.  Following problems were addressed during her hospitalization:  Closed intertrochanteric fracture of the right hip: Pain is well controlled at present.  Orthopedics  Following.S/p ORIF .  Continue pain management.  DVT prophylaxis with lovenox.  PT/OT recommended home health.Dvt ppx will be changed to aspirin on DC.  Acute hypoxic respiratory failure/chronic respiratory insufficiency:: Secondary to atelectasis.  She has fractures of 3 ribs on her left side.  CT chest did not show any pulmonary embolism but showed fractures, atelectasis.  Continue incentive spirometer.She desaturates on ambulation.  She qualified for home oxygen.  Right arm pain: Much better today.X-ray did not show any fracture or dislocation.  History of coronary artery disease/ischemic  cardiomyopathy: No symptoms at present.  Denies any chest pain.  On aspirin, statin and beta-blockers which we will continue.  Echocardiogram showed normal left ventricle ejection fraction, moderately elevated pulmonary artery pressure. She has history of PCI to the LAD in 2018.  Osteoporosis: on Fosamax at home.  Vitamin D level normal.  Hyperlipidemia: Continue statin  Normocytic anemia: Hb dropped after  surgery.No obvious  hematoma on the surgical site.  S/P transfusion with a unit of PRBC. Last Hb of  8.2.   Discharge Diagnoses:  Principal Problem:   Closed intertrochanteric fracture of right hip (HCC) Active Problems:   CAD in native artery   Ischemic cardiomyopathy   Hyperlipemia   Osteoporosis   Closed right hip fracture [facility](HCC)    Discharge Instructions  Discharge Instructions    Diet - low sodium heart healthy   Complete by: As directed    Discharge instructions   Complete by: As directed    1) Please follow up with your PCP in a week. Do a CBC test during the follow up. 2) Follow with orthopedics as an outpatient.  Name and  number the provider has been attached.  You have an appointment on 06/28/2019 3)Take prescribed medications as instructed.   Increase activity slowly   Complete by: As directed    Weight bearing as tolerated   Complete by: As directed      Allergies as of 07/18/2019   No Known Allergies     Medication List    TAKE these medications   acetaminophen 500 MG tablet Commonly known as: TYLENOL Take 500 mg by mouth daily as needed for moderate pain or headache.   alendronate 70 MG tablet Commonly known as: FOSAMAX Take 70 mg by mouth once a  week. Mondays   aspirin EC 325 MG tablet Take 1 tablet (325 mg total) by mouth 2 (two) times daily. What changed:   medication strength  how much to take  when to take this   atorvastatin 80 MG tablet Commonly known as: LIPITOR TAKE 1 TABLET BY MOUTH  DAILY   calcium carbonate 1500 (600  Ca) MG Tabs tablet Commonly known as: OSCAL Take 600 mg of elemental calcium by mouth 2 (two) times daily with a meal.   cetirizine 10 MG tablet Commonly known as: ZYRTEC Take 10 mg by mouth daily as needed for allergies.   CRANBERRY EXTRACT PO Take 1 capsule by mouth daily.   Fish Oil 1200 MG Caps Take 1,200 mg by mouth 2 (two) times daily.   HYDROcodone-acetaminophen 5-325 MG tablet Commonly known as: NORCO/VICODIN Take 1-2 tablets by mouth every 6 (six) hours as needed for moderate pain.   losartan 25 MG tablet Commonly known as: COZAAR Take 2 tablets (50 mg total) by mouth daily.   metoprolol succinate 50 MG 24 hr tablet Commonly known as: TOPROL-XL TAKE 1 TABLET BY MOUTH  DAILY   Multi-Vitamins Tabs Take 1 tablet by mouth daily.   nitroGLYCERIN 0.4 MG SL tablet Commonly known as: NITROSTAT Place 1 tablet (0.4 mg total) under the tongue every 5 (five) minutes as needed for chest pain.   omeprazole 20 MG capsule Commonly known as: PRILOSEC Take 20 mg by mouth daily.   SYSTANE OP Place 1 drop into both eyes daily as needed (dry eyes).   vitamin C 500 MG tablet Commonly known as: ASCORBIC ACID Take 500 mg by mouth daily.   Vitamin D3 50 MCG (2000 UT) capsule Take 2,000 Units by mouth daily.            Durable Medical Equipment  (From admission, onward)         Start     Ordered   07/18/19 1259  For home use only DME 3 n 1  Once     07/18/19 1259   07/18/19 1255  For home use only DME oxygen  Once    Question Answer Comment  Length of Need 6 Months   Mode or (Route) Nasal cannula   Liters per Minute 2   Frequency Continuous (stationary and portable oxygen unit needed)   Oxygen delivery system Gas      07/18/19 1254           Discharge Care Instructions  (From admission, onward)         Start     Ordered   07/15/19 0000  Weight bearing as tolerated     07/15/19 0730         Follow-up Information    Ollen Gross, MD. Schedule an  appointment as soon as possible for a visit on 07/28/2019.   Specialty: Orthopedic Surgery Contact information: [address] [place] [place] [address] [phone]        [facility] Follow up.   Specialty: Home Health Services Why: [facility] physical therapy/occupational therapy Contact information: [address] [place] [address] [phone]        [facility] Follow up.   Why: home oxygen, bedside commode Contact information: [address] [place] [address] [phone]        Renford Dills, MD. Schedule an appointment as soon as possible for a visit in 1 week(s).   Specialty: Internal Medicine Contact information: [address] [employer]  [address] [place] [address] [phone]          No Known Allergies  Consultations:  Ortho   Procedures/Studies: Dg Chest 1 View  Result Date: 07/11/2019 CLINICAL DATA:  Preop. Pain status post fall. EXAM: CHEST  1 VIEW COMPARISON:  None. FINDINGS: There is no pneumothorax. No large pleural effusion. The heart size is mildly enlarged. There is scarring versus atelectasis at the lung bases. There appear to be multiple calcified granulomas in the right lower lobe. There is no acute osseous abnormality. Aortic calcifications are noted. IMPRESSION: 1. No pneumothorax or large pleural effusion. 2. Bibasilar scarring versus atelectasis. 3. Multiple calcified granulomas in the right lower lobe. Electronically Signed   By: Katherine Mantle M.D.   On: 07/11/2019 18:32   Dg Shoulder Right  Result Date: 07/14/2019 CLINICAL DATA:  Fall, right arm swelling EXAM: RIGHT SHOULDER - 2+ VIEW COMPARISON:  It has FINDINGS: There is no dislocation. No acute fracture. Joint spaces are preserved. High-riding position of the humeral head likely reflecting rotator cuff disease. IMPRESSION: No acute fracture or dislocation. Electronically Signed   By: Guadlupe Spanish M.D.   On: 07/14/2019 10:16   Ct Head  Wo Contrast  Result Date: 07/11/2019 CLINICAL DATA:  Fall.  Head injury EXAM: CT HEAD WITHOUT CONTRAST CT CERVICAL SPINE WITHOUT CONTRAST TECHNIQUE: Multidetector CT imaging of the head and cervical spine was performed following the standard protocol without intravenous contrast. Multiplanar CT image reconstructions of the cervical spine were also generated. COMPARISON:  None. FINDINGS: CT HEAD FINDINGS Brain: Mild atrophy and mild chronic microvascular ischemic changes in the white matter. Negative for acute hemorrhage, infarct, or mass. No midline shift. Vascular: Negative for hyperdense vessel Skull: Negative for fracture.  Right frontal scalp hematoma. Sinuses/Orbits: Paranasal sinuses clear. Bilateral cataract surgery. Other: None CT CERVICAL SPINE FINDINGS Alignment: Mild anterolisthesis C6-7 otherwise normal alignment Skull base and vertebrae: Negative for fracture Soft tissues and spinal canal: Negative for soft tissue mass or swelling. Atherosclerotic calcification in the carotid bifurcation bilaterally. Disc levels: Diffuse facet degeneration right greater than left. Disc degeneration and spurring most prominent at C6-7. Mild spinal stenosis at C6-7 due to spurring. Upper chest: Negative Other: 8 mm right thyroid nodule. No further imaging is required for this lesion. IMPRESSION: 1. Atrophy and chronic microvascular ischemic change in the white matter. No acute intracranial abnormality. Right frontal scalp hematoma 2. Negative for cervical spine fracture. Electronically Signed   By: Marlan Palau M.D.   On: 07/11/2019 17:00   Ct Angio Chest Pe W Or Wo Contrast  Addendum Date: 07/16/2019   ADDENDUM REPORT: 07/16/2019 12:07 ADDENDUM: These results were called by telephone at the time of interpretation on 07/16/2019 at 12:06 pm to provider Keandre Linden [facility] , who verbally acknowledged these results. Electronically Signed   By: Romona Curls M.D.   On: 07/16/2019 12:07   Result Date: 07/16/2019 CLINICAL  DATA:  Hypoxemia EXAM: CT ANGIOGRAPHY CHEST WITH CONTRAST TECHNIQUE: Multidetector CT imaging of the chest was performed using the standard protocol during bolus administration of intravenous contrast. Multiplanar CT image reconstructions and MIPs were obtained to evaluate the vascular anatomy. CONTRAST:  OMNIPAQUE IOHEXOL 350 MG/ML SOLN COMPARISON:  Chest radiograph dated 07/11/2019 FINDINGS: Cardiovascular: Satisfactory opacification of the pulmonary arteries to the segmental level. No evidence of pulmonary embolism. Vascular calcifications are seen in the coronary arteries and aortic arch. The heart is enlarged. No pericardial effusion. Mediastinum/Nodes: No enlarged mediastinal, hilar, or axillary lymph nodes. Thyroid gland, trachea,  and esophagus demonstrate no significant findings. Lungs/Pleura: There is a small to moderate left and trace right pleural effusion with associated atelectasis. There is also mild right middle lobe atelectasis. There is no pneumothorax. Upper Abdomen: No acute abnormality. Musculoskeletal: There are acute fractures of the posterolateral ninth, tenth, and eleventh ribs. Review of the MIP images confirms the above findings. IMPRESSION: 1. No acute pulmonary embolism. 2. Acute fractures of the left posterolateral ninth, tenth, and eleventh ribs. 3. Small to moderate left and trace right pleural effusions with associated atelectasis. 4. Cardiomegaly. Aortic Atherosclerosis (ICD10-I70.0). Electronically Signed: By: Romona Curls M.D. On: 07/16/2019 11:59   Ct Cervical Spine Wo Contrast  Result Date: 07/11/2019 CLINICAL DATA:  Fall.  Head injury EXAM: CT HEAD WITHOUT CONTRAST CT CERVICAL SPINE WITHOUT CONTRAST TECHNIQUE: Multidetector CT imaging of the head and cervical spine was performed following the standard protocol without intravenous contrast. Multiplanar CT image reconstructions of the cervical spine were also generated. COMPARISON:  None. FINDINGS: CT HEAD FINDINGS  Brain: Mild atrophy and mild chronic microvascular ischemic changes in the white matter. Negative for acute hemorrhage, infarct, or mass. No midline shift. Vascular: Negative for hyperdense vessel Skull: Negative for fracture.  Right frontal scalp hematoma. Sinuses/Orbits: Paranasal sinuses clear. Bilateral cataract surgery. Other: None CT CERVICAL SPINE FINDINGS Alignment: Mild anterolisthesis C6-7 otherwise normal alignment Skull base and vertebrae: Negative for fracture Soft tissues and spinal canal: Negative for soft tissue mass or swelling. Atherosclerotic calcification in the carotid bifurcation bilaterally. Disc levels: Diffuse facet degeneration right greater than left. Disc degeneration and spurring most prominent at C6-7. Mild spinal stenosis at C6-7 due to spurring. Upper chest: Negative Other: 8 mm right thyroid nodule. No further imaging is required for this lesion. IMPRESSION: 1. Atrophy and chronic microvascular ischemic change in the white matter. No acute intracranial abnormality. Right frontal scalp hematoma 2. Negative for cervical spine fracture. Electronically Signed   By: Marlan Palau M.D.   On: 07/11/2019 17:00   Dg C-arm 1-60 Min-no Report  Result Date: 07/13/2019 Fluoroscopy was utilized by the requesting physician.  No radiographic interpretation.   Dg Hip Operative Unilat W Or W/o Pelvis Right  Result Date: 07/13/2019 CLINICAL DATA:  IM nail right femoral neck fracture EXAM: OPERATIVE right HIP (WITH PELVIS IF PERFORMED) 3 VIEWS TECHNIQUE: Fluoroscopic spot image(s) were submitted for interpretation post-operatively. COMPARISON:  07/11/2019 FINDINGS: Three low resolution intraoperative spot views of the right hip. Total fluoroscopy time was 42 seconds. The images demonstrate internal fixation of comminuted intertrochanteric fracture. Displaced lesser trochanteric fracture fragment is noted. IMPRESSION: Intraoperative fluoroscopic assistance provided during surgical fixation of  right femur fracture Electronically Signed   By: Jasmine Pang M.D.   On: 07/13/2019 18:41   Dg Hip Unilat With Pelvis 2-3 Views Right  Result Date: 07/11/2019 CLINICAL DATA:  Painful right hip after fall EXAM: DG HIP (WITH OR WITHOUT PELVIS) 2-3V RIGHT COMPARISON:  CT 03/02/2017 FINDINGS: SI joints are non widened. Acute comminuted right intertrochanteric fracture with displaced lesser trochanteric fracture fragment. Pubic symphysis appears intact. The femoral head is normally position. IMPRESSION: Acute comminuted right intertrochanteric fracture Electronically Signed   By: Jasmine Pang M.D.   On: 07/11/2019 17:36       Subjective: Patient seen and examined the bedside this morning.  Hemodynamically stable.  She feels much better today.  She is saturating fine on room air but easily desaturates on ambulation.  Qualified for home oxygen.  Discharge Exam: Vitals:   07/18/19 1200 07/18/19 1301  BP: (!) 122/50 (!) 116/45  Pulse: 82 85  Resp:  15  Temp:  99 F (37.2 C)  SpO2:  (!) 85%   Vitals:   07/18/19 0416 07/18/19 1100 07/18/19 1200 07/18/19 1301  BP: (!) 146/63 (!) 122/50 (!) 122/50 (!) 116/45  Pulse: 98  82 85  Resp: 18   15  Temp: 98.2 F (36.8 C)   99 F (37.2 C)  TempSrc:    Oral  SpO2: 97%   (!) 85%  Weight:      Height:        General: Pt is alert, awake, not in acute distress Cardiovascular: RRR, S1/S2 +, no rubs, no gallops Respiratory: CTA bilaterally, no wheezing, no rhonchi Abdominal: Soft, NT, ND, bowel sounds + Extremities: no edema, no cyanosis    The results of significant diagnostics from this hospitalization (including imaging, microbiology, ancillary and laboratory) are listed below for reference.     Microbiology: Recent Results (from the past 240 hour(s))  SARS CORONAVIRUS 2 (TAT 6-24 HRS) Nasopharyngeal Nasopharyngeal Swab     Status: None   Collection Time: 07/11/19  7:03 PM   Specimen: Nasopharyngeal Swab  Result Value Ref Range Status    SARS Coronavirus 2 NEGATIVE NEGATIVE Final    Comment: (NOTE) SARS-CoV-2 target nucleic acids are NOT DETECTED. The SARS-CoV-2 RNA is generally detectable in upper and lower respiratory specimens during the acute phase of infection. Negative results do not preclude SARS-CoV-2 infection, do not rule out co-infections with other pathogens, and should not be used as the sole basis for treatment or other patient management decisions. Negative results must be combined with clinical observations, patient history, and epidemiological information. The expected result is Negative. Fact Sheet for Patients: HairSlick.no Fact Sheet for Healthcare Providers: quierodirigir.com This test is not yet approved or cleared by the [place] FDA and  has been authorized for detection and/or diagnosis of SARS-CoV-2 by FDA under an Emergency Use Authorization (EUA). This EUA will remain  in effect (meaning this test can be used) for the duration of the COVID-19 declaration under Section 56 4(b)(1) of the Act, 21 U.S.C. section 360bbb-3(b)(1), unless the authorization is terminated or revoked sooner. Performed at [facility], [address], [place], [place] [address]   MRSA PCR Screening     Status: None   Collection Time: 07/12/19 10:48 AM   Specimen: Nasal Mucosa; Nasopharyngeal  Result Value Ref Range Status   MRSA by PCR NEGATIVE NEGATIVE Final    Comment:        The GeneXpert MRSA Assay (FDA approved for NASAL specimens only), is one component of a comprehensive MRSA colonization surveillance program. It is not intended to diagnose MRSA infection nor to guide or monitor treatment for MRSA infections. Performed at [facility], [address] [place], [place] [address]      Labs: BNP (last 3 results) No results for input(s): BNP in the last 8760 hours. Basic Metabolic Panel: Recent Labs  Lab  07/11/19 1604 07/12/19 0447 07/14/19 0503  NA 139 137 135  K 4.3 4.3 5.1  CL 104 101 101  CO2 GLUCOSE 140* 133* 132*  BUN CREATININE 0.89 0.80 0.66  CALCIUM 9.2 8.7* 8.4*   Liver Function Tests: No results for input(s): AST, ALT, ALKPHOS, BILITOT, PROT, ALBUMIN in the last 168 hours. No results for input(s): LIPASE, AMYLASE in the last 168 hours. No results for input(s): AMMONIA in the last  168 hours. CBC: Recent Labs  Lab 07/11/19 2035 07/12/19 0447 07/14/19 0503 07/15/19 0607 07/16/19 0433 07/17/19 0950  WBC 12.6* 8.0 11.0* 11.1* 7.7 6.4  NEUTROABS 10.3*  --  8.5*  --   --  4.1  HGB 12.2 10.1* 9.3* 8.7* 7.8* 8.2*  HCT 37.7 30.7* 27.4* 25.2* 23.6* 25.1*  MCV 95.0 91.9 88.1 88.1 92.5 92.6  PLT 102* 145* 148* 162 142* 141*   Cardiac Enzymes: No results for input(s): CKTOTAL, CKMB, CKMBINDEX, TROPONINI in the last 168 hours. BNP: Invalid input(s): POCBNP CBG: No results for input(s): GLUCAP in the last 168 hours. D-Dimer No results for input(s): DDIMER in the last 72 hours. Hgb A1c No results for input(s): HGBA1C in the last 72 hours. Lipid Profile No results for input(s): CHOL, HDL, LDLCALC, TRIG, CHOLHDL, LDLDIRECT in the last 72 hours. Thyroid function studies No results for input(s): TSH, T4TOTAL, T3FREE, THYROIDAB in the last 72 hours.  Invalid input(s): FREET3 Anemia work up No results for input(s): VITAMINB12, FOLATE, FERRITIN, TIBC, IRON, RETICCTPCT in the last 72 hours. Urinalysis    Component Value Date/Time   COLORURINE YELLOW 07/12/2019 1039   APPEARANCEUR HAZY (A) 07/12/2019 1039   LABSPEC 1.019 07/12/2019 1039   PHURINE 5.0 07/12/2019 1039   GLUCOSEU NEGATIVE 07/12/2019 1039   HGBUR NEGATIVE 07/12/2019 1039   [place] 07/12/2019 1039   KETONESUR NEGATIVE 07/12/2019 1039   PROTEINUR 30 (A) 07/12/2019 1039   NITRITE NEGATIVE 07/12/2019 1039   LEUKOCYTESUR SMALL (A) 07/12/2019 1039   Sepsis Labs Invalid  input(s): PROCALCITONIN,  WBC,  LACTICIDVEN Microbiology Recent Results (from the past 240 hour(s))  SARS CORONAVIRUS 2 (TAT 6-24 HRS) Nasopharyngeal Nasopharyngeal Swab     Status: None   Collection Time: 07/11/19  7:03 PM   Specimen: Nasopharyngeal Swab  Result Value Ref Range Status   SARS Coronavirus 2 NEGATIVE NEGATIVE Final    Comment: (NOTE) SARS-CoV-2 target nucleic acids are NOT DETECTED. The SARS-CoV-2 RNA is generally detectable in upper and lower respiratory specimens during the acute phase of infection. Negative results do not preclude SARS-CoV-2 infection, do not rule out co-infections with other pathogens, and should not be used as the sole basis for treatment or other patient management decisions. Negative results must be combined with clinical observations, patient history, and epidemiological information. The expected result is Negative. Fact Sheet for Patients: SugarRoll.be Fact Sheet for Healthcare Providers: https://www.woods-mathews.com/ This test is not yet approved or cleared by the [place] FDA and  has been authorized for detection and/or diagnosis of SARS-CoV-2 by FDA under an Emergency Use Authorization (EUA). This EUA will remain  in effect (meaning this test can be used) for the duration of the COVID-19 declaration under Section 56 4(b)(1) of the Act, 21 U.S.C. section 360bbb-3(b)(1), unless the authorization is terminated or revoked sooner. Performed at [facility], [place] [address], [place], [place] [address]   MRSA PCR Screening     Status: None   Collection Time: 07/12/19 10:48 AM   Specimen: Nasal Mucosa; Nasopharyngeal  Result Value Ref Range Status   MRSA by PCR NEGATIVE NEGATIVE Final    Comment:        The GeneXpert MRSA Assay (FDA approved for NASAL specimens only), is one component of a comprehensive MRSA colonization surveillance program. It is not intended to diagnose  MRSA infection nor to guide or monitor treatment for MRSA infections. Performed at [facility], [place] [address] [place], [place] [address]  Please note: You were cared for by a hospitalist during your hospital stay. Once you are discharged, your primary care physician will handle any further medical issues. Please note that NO REFILLS for any discharge medications will be authorized once you are discharged, as it is imperative that you return to your primary care physician (or establish a relationship with a primary care physician if you do not have one) for your post hospital discharge needs so that they can reassess your need for medications and monitor your lab values.    Time coordinating discharge: 40 minutes  SIGNED:   Burnadette PopAmrit Malaijah Houchen, MD  [facility] 07/18/2019, 1:02 PM Pager [phone]  If 7PM-7AM, please contact night-coverage [email or web address] Password [email or web address]

## 2019-07-18 NOTE — Progress Notes (Signed)
SATURATION QUALIFICATIONS: (This note is used to comply with regulatory documentation for home oxygen)  Patient Saturations on Room Air at Rest =83%  Patient Saturations on Room Air while Ambulating =79%  Patient Saturations on 1.5 Liters of oxygen while Ambulating =95%  Please briefly explain why patient needs home oxygen:Patient's O2 Sats are dropping when not on O2.

## 2019-07-18 NOTE — TOC Transition Note (Signed)
Transition of Care [facility]) - CM/SW Discharge Note   Patient Details  Name: Carrie Barnes MRN: [id number] Date of Birth: 1936/04/30  [facility] CM/SW Contact:  Carrie Phi, RN Phone Number: 07/18/2019, 12:59 PM   Clinical Narrative: Dtr also wants 3n1-[employer] rep Carrie Barnes aware.      Final next level of care: [place] Barriers to Discharge: No Barriers Identified   Patient Goals and CMS Choice Patient states their goals for this hospitalization and ongoing recovery are:: go home CMS Medicare.gov Compare Post Acute Care list provided to:: Patient Represenative (must comment) Choice offered to / list presented to : Patient, Adult Children  Discharge Placement                       Discharge Plan and Services   Discharge Planning Services: CM Consult Post Acute Care Choice: Home Health          DME Arranged: 3-N-1 DME Agency: [employer] Date DME Agency Contacted: 07/18/19 Time DME Agency Contacted: 1638 Representative spoke with at DME Agency: Carrie Barnes: PT Carrie Barnes: [facility] [employer]     Representative spoke with at [place]: Carrie Barnes ([place]) Interventions     Readmission Risk Interventions No flowsheet data found.

## 2019-08-15 ENCOUNTER — Other Ambulatory Visit: Payer: Self-pay | Admitting: Cardiovascular Disease

## 2019-08-20 ENCOUNTER — Ambulatory Visit: Payer: Medicare Other | Attending: Internal Medicine

## 2019-08-20 DIAGNOSIS — Z23 Encounter for immunization: Secondary | ICD-10-CM | POA: Insufficient documentation

## 2019-08-20 NOTE — Progress Notes (Signed)
   Covid-19 Vaccination Clinic  Name:  Carrie Barnes    MRN: [id number] DOB: 10-19-1935  08/20/2019  Carrie Barnes was observed post Covid-19 immunization for 15 minutes without incidence. She was provided with Vaccine Information Sheet and instruction to access the V-Safe system.   Carrie Barnes was instructed to call 911 with any severe reactions post vaccine: Marland Kitchen Difficulty breathing  . Swelling of your face and throat  . A fast heartbeat  . A bad rash all over your body  . Dizziness and weakness    Immunizations Administered    Name Date Dose VIS Date Route   Pfizer COVID-19 Vaccine 08/20/2019 11:23 AM 0.3 mL 07/22/2019 Intramuscular   Manufacturer: [employer], [employer]   Lot: [id number]   NDC: [address]-9

## 2019-08-31 ENCOUNTER — Other Ambulatory Visit: Payer: Self-pay | Admitting: Internal Medicine

## 2019-08-31 ENCOUNTER — Ambulatory Visit
Admission: RE | Admit: 2019-08-31 | Discharge: 2019-08-31 | Disposition: A | Discharge status: Home / Self Care | Payer: Medicare Other | Source: Ambulatory Visit | Attending: Internal Medicine | Admitting: Internal Medicine

## 2019-08-31 DIAGNOSIS — M545 Low back pain, unspecified: Secondary | ICD-10-CM

## 2019-09-01 ENCOUNTER — Other Ambulatory Visit: Payer: Self-pay | Admitting: Cardiovascular Disease

## 2019-09-10 ENCOUNTER — Ambulatory Visit: Payer: Medicare Other

## 2019-09-10 ENCOUNTER — Ambulatory Visit: Payer: Medicare Other | Attending: Internal Medicine

## 2019-09-10 DIAGNOSIS — Z23 Encounter for immunization: Secondary | ICD-10-CM | POA: Insufficient documentation

## 2019-09-10 NOTE — Progress Notes (Signed)
   Covid-19 Vaccination Clinic  Name:  Carrie Barnes    MRN: [id number] DOB: 1936/03/23  09/10/2019  [name] was observed post Covid-19 immunization for 15 minutes without incidence. She was provided with Vaccine Information Sheet and instruction to access the V-Safe system.   [name] was instructed to call 911 with any severe reactions post vaccine: Marland Kitchen Difficulty breathing  . Swelling of your face and throat  . A fast heartbeat  . A bad rash all over your body  . Dizziness and weakness    Immunizations Administered    Name Date Dose VIS Date Route   Pfizer COVID-19 Vaccine 09/10/2019  1:49 PM 0.3 mL 07/22/2019 Intramuscular   Manufacturer: [employer], [employer]   Lot: [id number]   NDC: [address]-4

## 2020-01-27 ENCOUNTER — Telehealth: Payer: Self-pay | Admitting: Cardiovascular Disease

## 2020-01-27 NOTE — Telephone Encounter (Signed)
Flonnie Hailstone from [employer] is calling requesting the pt's most recent office notes and last Echo results. The fax number is [phone].

## 2020-01-30 ENCOUNTER — Telehealth: Payer: Self-pay | Admitting: Cardiovascular Disease

## 2020-01-30 DIAGNOSIS — I10 Essential (primary) hypertension: Secondary | ICD-10-CM

## 2020-01-30 MED ORDER — LOSARTAN POTASSIUM 50 MG PO TABS: 100.0000 mg | ORAL_TABLET | Freq: Every day | ORAL | 3 refills | Status: DC

## 2020-01-30 NOTE — Telephone Encounter (Signed)
I would have her double the Losartan to 100 mg daily and check a BMET early next week. Agree with appt in HTN clinic next week. Thanks, chris

## 2020-01-30 NOTE — Telephone Encounter (Signed)
Normal BPs are 138/87 ish Much higher over the last week. She checks daily Taking meds as listed.  Reviewed her diet, does not seem to eat overly salty foods. Slight headache but no other symptoms, feels well otherwise.  Adv will forward to Dr. Clifton James for advisement and will call her back with recommendations.  Adv we could have BP clinic see her in a couple weeks if MD wants to adjust medication. Pt appreciative for call back and information provided.

## 2020-01-30 NOTE — Telephone Encounter (Signed)
Pt c/o BP issue: STAT if pt c/o blurred vision, one-sided weakness or slurred speech  1. What are your last 5 BP readings? 177/110, 176/111, 166/104  2. Are you having any other symptoms (ex. Dizziness, headache, blurred vision, passed out)? Little headache  3. What is your BP issue? Blood pressure running home\

## 2020-01-30 NOTE — Telephone Encounter (Signed)
Patient has been informed and will increase losartan to 100 mg starting today.  Will come Tue 6/29 for labs and then HTN clinic on July 1.  Grateful for assistance provided today.

## 2020-02-01 IMAGING — RF DG ERCP WO/W SPHINCTEROTOMY
1 series · 4 of 4 positions shown · non-contrast
Comparison: 11/05/2017

CLINICAL DATA: 81-year-old female with a history of
choledocholithiasis

EXAM:
ERCP
TECHNIQUE: Multiple spot images obtained with the fluoroscopic device and
submitted for interpretation post-procedure.
FLUOROSCOPY TIME:  Fluoroscopy Time:  3 minutes 6 seconds

[Series 1: run · 4 of 69 frames shown]
[frame 11/69]
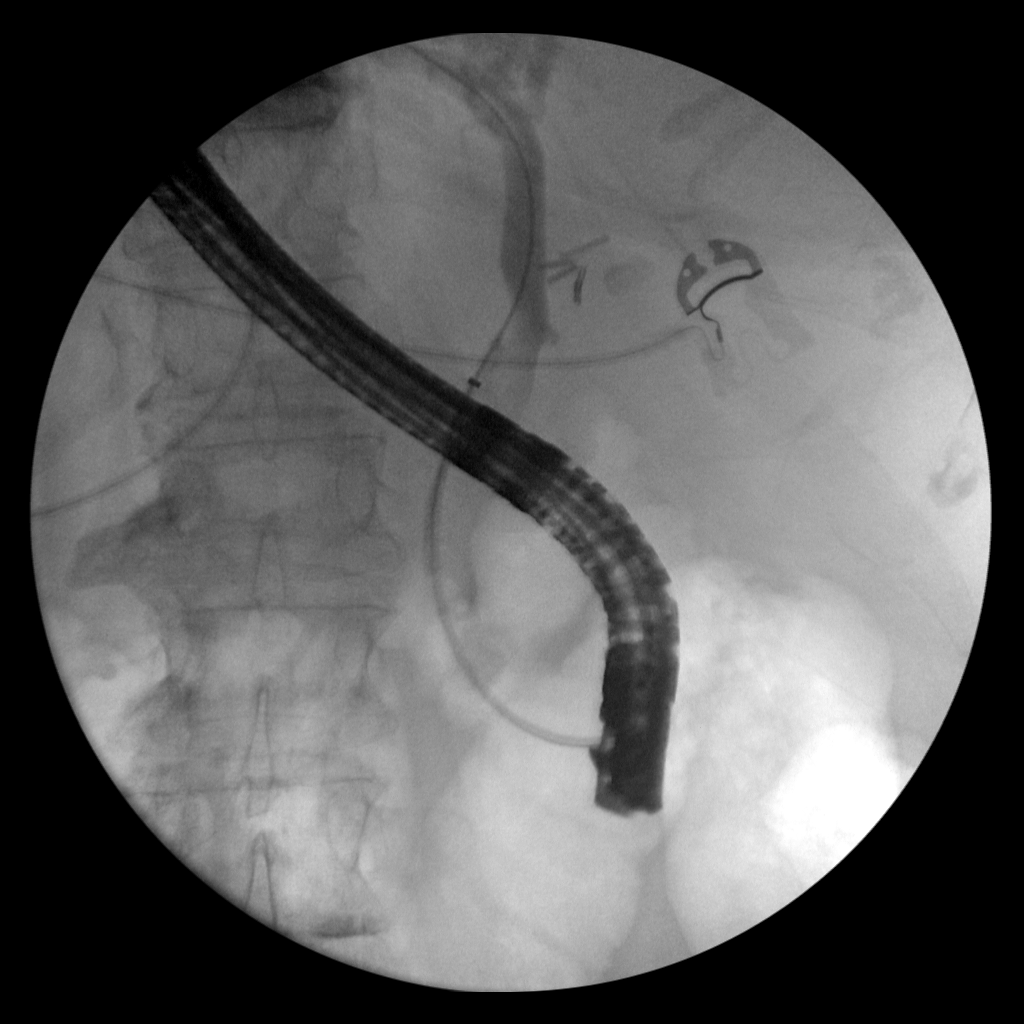
[frame 16/69]
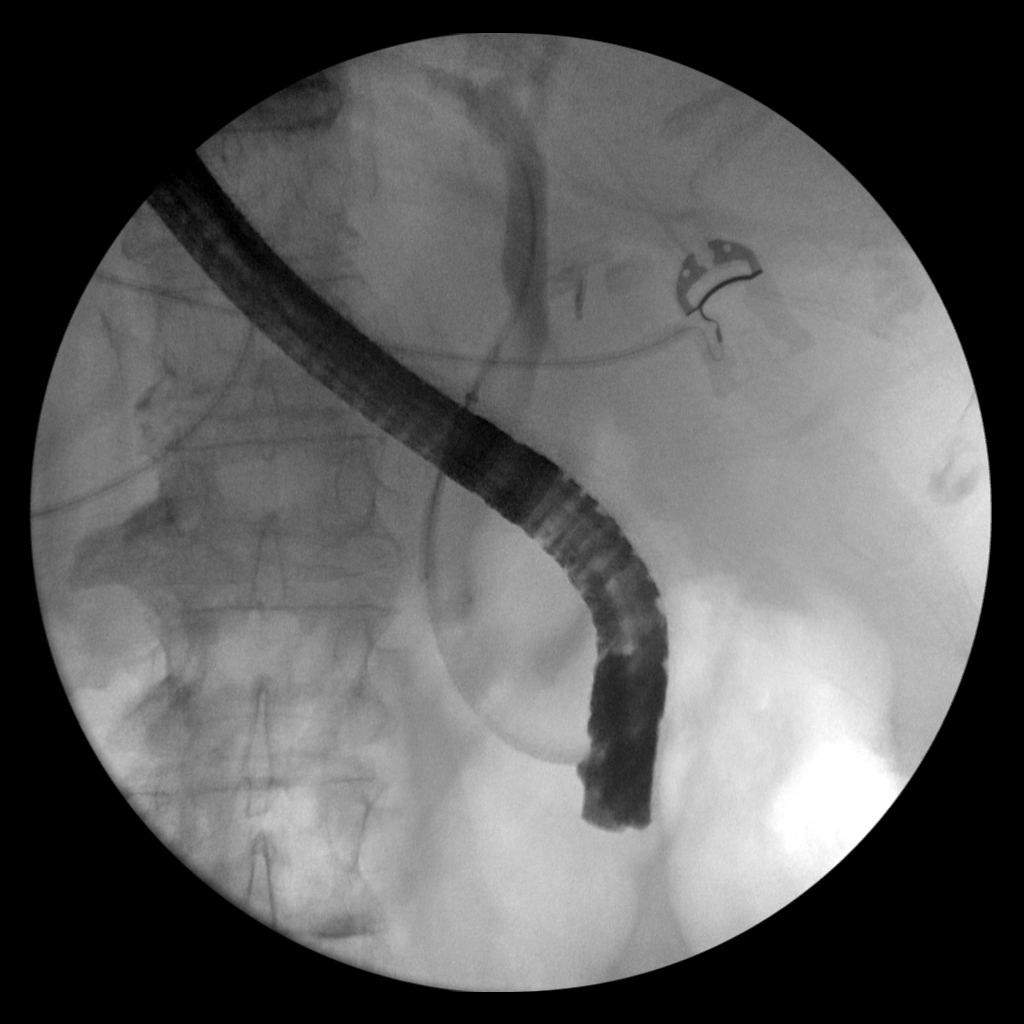
[frame 35/69]
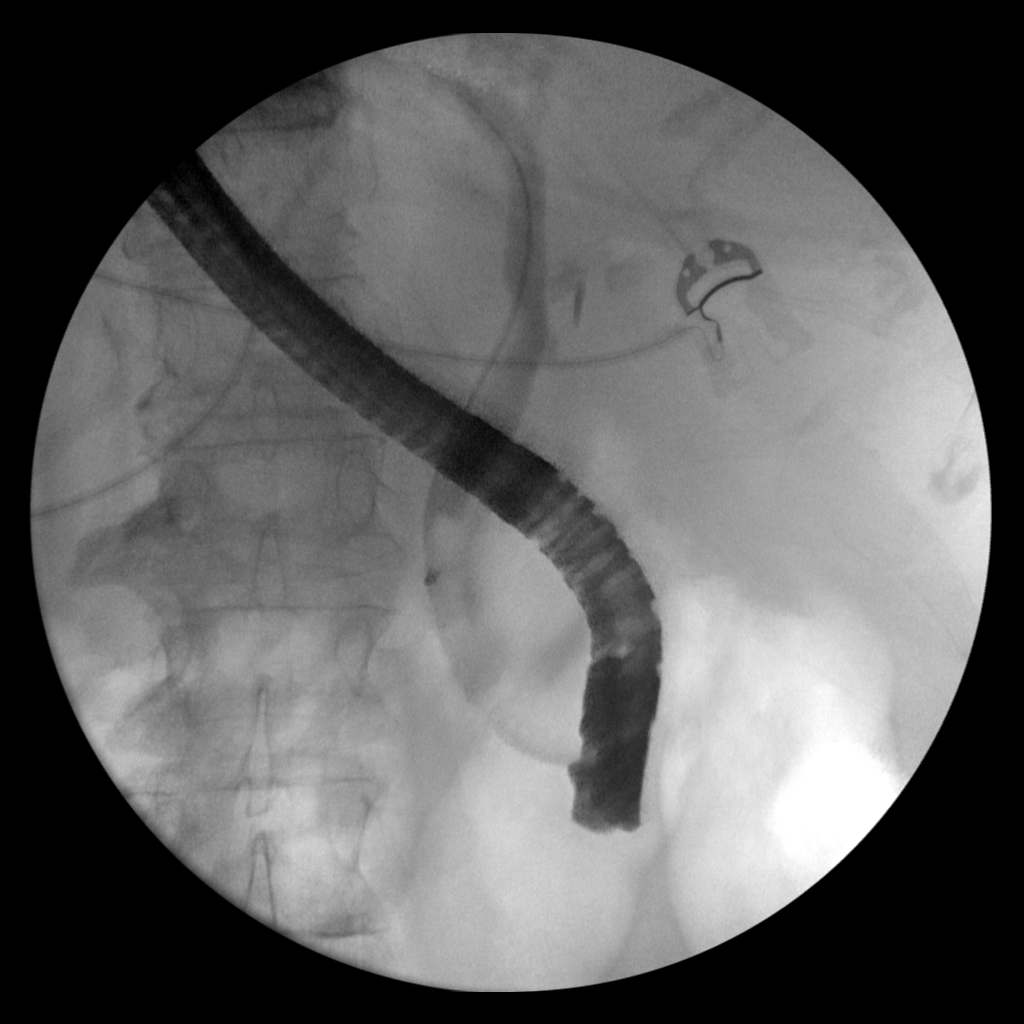
[frame 59/69]
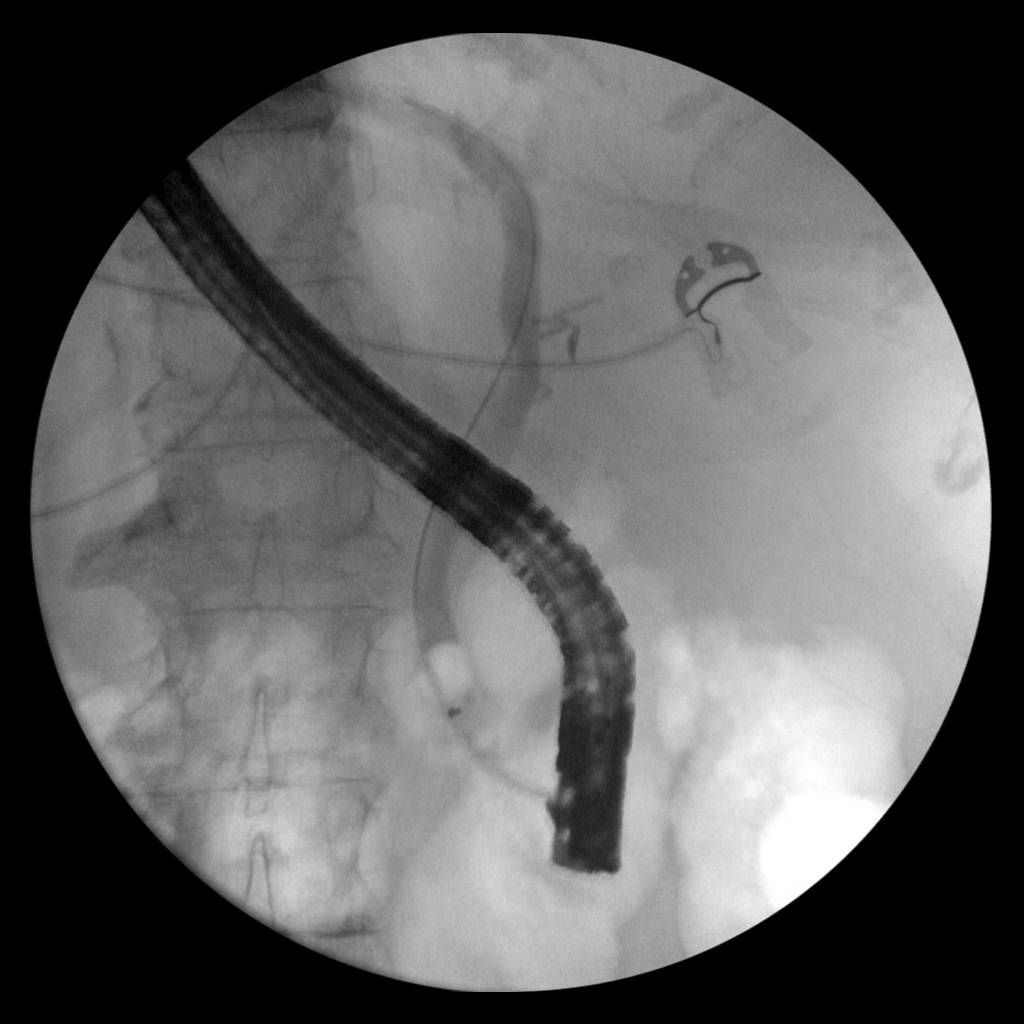

[4 of 4 positions shown; findings below may reference images not displayed]

FINDINGS: Multiple intraoperative fluoroscopic spot images during ERCP.

Initial image demonstrates endoscope projecting over the upper
abdomen with surgical changes of cholecystectomy.

There is then cannulation of the ampulla and retrograde infusion of
contrast.

Deployment of a retrieval balloon. Incomplete opacification of the
ductal system.
IMPRESSION: Limited fluoroscopic images demonstrate ERCP with deployment of a
retrieval balloon in the extrahepatic biliary ducts. Please refer to
the dictated operative report for full details of intraoperative
findings and procedure.

## 2020-02-07 ENCOUNTER — Other Ambulatory Visit: Payer: Medicare Other

## 2020-02-07 ENCOUNTER — Other Ambulatory Visit: Payer: Self-pay

## 2020-02-07 DIAGNOSIS — I10 Essential (primary) hypertension: Secondary | ICD-10-CM

## 2020-02-07 LAB — BASIC METABOLIC PANEL
BUN/Creatinine Ratio: 15 (ref 12–28)
BUN: 13 mg/dL (ref 8–27)
CO2: 26 mmol/L (ref 20–29)
Calcium: 9.8 mg/dL (ref 8.7–10.3)
Chloride: 97 mmol/L (ref 96–106)
Creatinine, Ser: 0.84 mg/dL (ref 0.57–1.00)
GFR calc Af Amer: 75 mL/min/{1.73_m2} (ref >59)
GFR calc non Af Amer: 65 mL/min/{1.73_m2} (ref >59)
Glucose: 100 mg/dL — ABNORMAL HIGH (ref 65–99)
Potassium: 5 mmol/L (ref 3.5–5.2)
Sodium: 137 mmol/L (ref 134–144)

## 2020-02-09 ENCOUNTER — Ambulatory Visit: Payer: Medicare Other

## 2020-02-10 ENCOUNTER — Other Ambulatory Visit: Payer: Self-pay

## 2020-02-10 ENCOUNTER — Ambulatory Visit (INDEPENDENT_AMBULATORY_CARE_PROVIDER_SITE_OTHER): Payer: Medicare Other | Admitting: Pharmacist

## 2020-02-10 ENCOUNTER — Encounter: Payer: Self-pay | Admitting: Pharmacist

## 2020-02-10 VITALS — BP 126/76 | HR 72

## 2020-02-10 DIAGNOSIS — I10 Essential (primary) hypertension: Secondary | ICD-10-CM | POA: Diagnosis not present

## 2020-02-10 NOTE — Progress Notes (Signed)
Patient ID: Carrie Barnes                 DOB: 1936/08/10                      MRN: [id number]     HPI: Carrie Barnes is a 84 y.o. female referred by Dr. Dicie Beam to HTN clinic. PMH is significant for MI (2018), CAD, cardiomyopathy, hip fracture, and cholelithiasis.    Contacted heart care on 6/18 and reported elevated blood pressures.  Dr. Clifton James recommended patient increase losartan to 100 mg daily and check lab work in 1 week.  Patient increased losartan without any adverse effects.  Has been testing BP at home but can not remember any results.  Has been taking losartan 50 mg in morning and again at night.  Takes metoprolol at night.  Sometimes thinks her feet feel swollen.  Presents today in good spirits to HTN clinic for blood pressure management.  Current HTN meds: losartan 100 mg daily , metoprolol succinate 50 mg daily Previously tried: losartan 25 mg, losartan 50 mg BP goal: <130/80  Social History: Former smoker (quit 40 years ago)  Diet: does not add salt to food  Exercise: unable to do strenuous exercise due to back pain   Wt Readings from Last 3 Encounters:  07/16/19 190 lb 0.6 oz (86.2 kg)  06/20/19 168 lb (76.2 kg)  05/10/18 159 lb 12.8 oz (72.5 kg)   BP Readings from Last 3 Encounters:  07/18/19 (!) 116/45  06/20/19 124/70  05/10/18 126/70   Pulse Readings from Last 3 Encounters:  07/18/19 85  06/20/19 82  05/10/18 72    Renal function: CrCl cannot be calculated (Unknown ideal weight.).  Past Medical History:  Diagnosis Date  . Cholecystitis   . Coronary artery disease    a. 03/2017: 80-90% LAD stenosis (PCI/DES placement with a 2.75x16 mm Promus Premier stent). No significant stenosis along RCA or LCx.   Marland Kitchen DJD (degenerative joint disease)   . Hyperlipidemia   . Hypertension    dx s/p MI   . Ischemic cardiomyopathy   . Myocardial infarction [facility]) 03/17/2017   03-2017 treated at [facility]   . Osteoporosis   .  Status post insertion of drug-eluting stent into left anterior descending (LAD) artery 03/2017    Current Outpatient Medications on File Prior to Visit  Medication Sig Dispense Refill  . acetaminophen (TYLENOL) 500 MG tablet Take 500 mg by mouth daily as needed for moderate pain or headache.    . alendronate (FOSAMAX) 70 MG tablet Take 70 mg by mouth once a week. Mondays    . aspirin EC 325 MG tablet Take 1 tablet (325 mg total) by mouth 2 (two) times daily. 40 tablet 0  . atorvastatin (LIPITOR) 80 MG tablet TAKE 1 TABLET BY MOUTH  DAILY 90 tablet 3  . calcium carbonate (OSCAL) 1500 (600 Ca) MG TABS tablet Take 600 mg of elemental calcium by mouth 2 (two) times daily with a meal.    . cetirizine (ZYRTEC) 10 MG tablet Take 10 mg by mouth daily as needed for allergies.     . Cholecalciferol (VITAMIN D3) 2000 units capsule Take 2,000 Units by mouth daily.    Marland Kitchen CRANBERRY EXTRACT PO Take 1 capsule by mouth daily.    Marland Kitchen HYDROcodone-acetaminophen (NORCO/VICODIN) 5-325 MG tablet Take 1-2 tablets by mouth every 6 (six) hours as needed for moderate pain. 40 tablet 0  . losartan (  COZAAR) 50 MG tablet Take 2 tablets (100 mg total) by mouth daily. 180 tablet 3  . metoprolol succinate (TOPROL-XL) 50 MG 24 hr tablet TAKE 1 TABLET BY MOUTH  DAILY 90 tablet 3  . Multiple Vitamin (MULTI-VITAMINS) TABS Take 1 tablet by mouth daily.    . nitroGLYCERIN (NITROSTAT) 0.4 MG SL tablet Place 1 tablet (0.4 mg total) under the tongue every 5 (five) minutes as needed for chest pain. 25 tablet 3  . Omega-3 Fatty Acids (FISH OIL) 1200 MG CAPS Take 1,200 mg by mouth 2 (two) times daily.     Marland Kitchen omeprazole (PRILOSEC) 20 MG capsule Take 20 mg by mouth daily.    Bertram Gala Glycol-Propyl Glycol (SYSTANE OP) Place 1 drop into both eyes daily as needed (dry eyes).    . vitamin C (ASCORBIC ACID) 500 MG tablet Take 500 mg by mouth daily.     Current Facility-Administered Medications on File Prior to Visit  Medication Dose Route  Frequency Provider Last Rate Last Admin  . indomethacin (INDOCIN) 50 MG suppository 50 mg  50 mg Rectal Once Willis Modena, MD        No Known Allergies   Assessment/Plan:  1. Hypertension - Patient blood pressure today 130/80 (126/76 on repeat) which is at and below goal of <130/80 and bloodwork WNL.   Advised patient to continue losartan 100 mg (recommended she take both 50 mg tablets together) and metoprolol 50 mg and begin to monitor BP 2 hours after taking medications.  Continue to monitor salt content.  Encouraged patient to attempt to get more exercise as tolerated.  Feet did not appear swollen today, however, advised patient if she feels like they are edematous to call office.  No medication changes needed at this time.  Recheck as needed.  Laural Golden, PharmD, BCACP, CDCES [facility] [address] [place], [place] [address] Phone: [phone]; Fax: [phone] 02/10/2020 3:02 PM

## 2020-02-10 NOTE — Patient Instructions (Signed)
It was nice meeting you today!  Your blood pressure goal is less than 130/80  Your repeat blood pressure today was 126/76 which is excellent.  Continue your losartan 100 mg and metoprolol 50 mg once daily  Continue to check your blood pressure twice daily and call with any concerns  Laural Golden, PharmD, Patsy [place], CDCES [facility] [address], [place], [place] [address] Phone: [phone]; Fax: [phone] 02/10/2020 2:11 PM

## 2020-03-05 NOTE — Telephone Encounter (Signed)
Called patient.  Just was out having eyes checked.  Feeling pretty good overall but still getting average blood pressure readings 160s/high 80s daily.   Usually taking losartan 100 mg in AM and Toprol XL 50 mg in PM.  Last night took extra dose of Toprol XL.  I adv her to go to ER for neuro changes -headache or vision changes or slurred speech or weakness. I have scheduled her w/ PharmD in HTN clinic tomorrow.  Pt grateful for call/assistance.

## 2020-03-06 ENCOUNTER — Encounter: Payer: Self-pay | Admitting: Pharmacist

## 2020-03-06 ENCOUNTER — Ambulatory Visit (INDEPENDENT_AMBULATORY_CARE_PROVIDER_SITE_OTHER): Payer: Medicare Other | Admitting: Pharmacist

## 2020-03-06 ENCOUNTER — Other Ambulatory Visit: Payer: Self-pay

## 2020-03-06 VITALS — BP 116/80 | HR 73 | Wt 158.2 lb

## 2020-03-06 DIAGNOSIS — I10 Essential (primary) hypertension: Secondary | ICD-10-CM

## 2020-03-06 NOTE — Progress Notes (Signed)
Patient ID: Carrie Barnes                 DOB: 1935-12-18                      MRN: [id number]     HPI: Carrie Barnes is a 84 y.o. female referred by Dr. Malena Peer to HTN clinic. PMH is significant for MI (2018), HLD, CAD, cardiomyopathy, hip fracture, and cholelithiasis.  Presented to HTN clinic on 02/10/20 after starting losartan 100 mg and metoprolol 50 mg daily.  BP was at goal in room (130/80 and 126/76).  Recommended patient increase exercise as tolerated and decrease salt content.    Patient returns today because home BP readings have slowly increased since last visit.  Reached very elevated over the weekend with readings of 200/107, 206/105, and 199/101.  Yesterday, readings were 131/83 and 139/83.  Patient had received news that her son was diagnosed with cancer on Saturday 7/24 .  Otherwise, no other changes.  Continues to try to eat a low sodium diet.  Is not physically active due to hip and back pain.   Current HTN meds: losartan 100 mg daily, metoprolol succinate 50 mg daily Previously tried: losartan 25 mg, losartan 50 mg BP goal: <130/80  Social History: Quit smoking 30 years ago  Wt Readings from Last 3 Encounters:  07/16/19 190 lb 0.6 oz (86.2 kg)  06/20/19 168 lb (76.2 kg)  05/10/18 159 lb 12.8 oz (72.5 kg)   BP Readings from Last 3 Encounters:  02/10/20 126/76  07/18/19 (!) 116/45  06/20/19 124/70   Pulse Readings from Last 3 Encounters:  02/10/20 72  07/18/19 85  06/20/19 82    Renal function: CrCl cannot be calculated (Patient's most recent lab result is older than the maximum 21 days allowed.).  Past Medical History:  Diagnosis Date  . Cholecystitis   . Coronary artery disease    a. 03/2017: 80-90% LAD stenosis (PCI/DES placement with a 2.75x16 mm Promus Premier stent). No significant stenosis along RCA or LCx.   Marland Kitchen DJD (degenerative joint disease)   . Hyperlipidemia   . Hypertension    dx s/p MI   . Ischemic cardiomyopathy   .  Myocardial infarction [facility]) 03/17/2017   03-2017 treated at [facility]   . Osteoporosis   . Status post insertion of drug-eluting stent into left anterior descending (LAD) artery 03/2017    Current Outpatient Medications on File Prior to Visit  Medication Sig Dispense Refill  . acetaminophen (TYLENOL) 500 MG tablet Take 1,000 mg by mouth every 8 (eight) hours as needed for moderate pain or headache.     . alendronate (FOSAMAX) 70 MG tablet Take 70 mg by mouth once a week. Mondays    . aspirin EC 325 MG tablet Take 1 tablet (325 mg total) by mouth 2 (two) times daily. (Patient not taking: Reported on 02/10/2020) 40 tablet 0  . aspirin EC 81 MG tablet Take 81 mg by mouth daily. Swallow whole.    Marland Kitchen atorvastatin (LIPITOR) 80 MG tablet TAKE 1 TABLET BY MOUTH  DAILY 90 tablet 3  . calcium carbonate (OSCAL) 1500 (600 Ca) MG TABS tablet Take 600 mg of elemental calcium by mouth 2 (two) times daily with a meal.    . cetirizine (ZYRTEC) 10 MG tablet Take 10 mg by mouth daily as needed for allergies.     . Cholecalciferol (VITAMIN D3) 2000 units capsule Take 2,000 Units by  mouth daily.    Marland Kitchen CRANBERRY EXTRACT PO Take 1 capsule by mouth daily.    Marland Kitchen HYDROcodone-acetaminophen (NORCO/VICODIN) 5-325 MG tablet Take 1-2 tablets by mouth every 6 (six) hours as needed for moderate pain. (Patient not taking: Reported on 02/10/2020) 40 tablet 0  . losartan (COZAAR) 100 MG tablet Take 100 mg by mouth daily.    . metoprolol succinate (TOPROL-XL) 50 MG 24 hr tablet TAKE 1 TABLET BY MOUTH  DAILY 90 tablet 3  . Multiple Vitamin (MULTI-VITAMINS) TABS Take 1 tablet by mouth daily.    . nitroGLYCERIN (NITROSTAT) 0.4 MG SL tablet Place 1 tablet (0.4 mg total) under the tongue every 5 (five) minutes as needed for chest pain. 25 tablet 3  . Omega-3 Fatty Acids (FISH OIL) 1200 MG CAPS Take 1,200 mg by mouth 2 (two) times daily.     Marland Kitchen omeprazole (PRILOSEC) 20 MG capsule Take 20 mg by mouth daily.    Bertram Gala  Glycol-Propyl Glycol (SYSTANE OP) Place 1 drop into both eyes daily as needed (dry eyes).    . Probiotic Product (TRUNATURE DIGESTIVE PROBIOTIC) CAPS Take 1 capsule by mouth daily.    . vitamin C (ASCORBIC ACID) 500 MG tablet Take 500 mg by mouth daily.     Current Facility-Administered Medications on File Prior to Visit  Medication Dose Route Frequency Provider Last Rate Last Admin  . indomethacin (INDOCIN) 50 MG suppository 50 mg  50 mg Rectal Once Willis Modena, MD        No Known Allergies   Assessment/Plan:  1. Hypertension - Patient BP in room using home cuff 121/60.  Manually checked BP: 116/80.  Both are at goal of <130/80.  Increase in blood pressure likely caused by increased stress and worry over son's diagnosis.  Educated patient on correct cuff placement including placing mark over artery.  Patient had previously not known to do this.  Recommended patient continue to follow low sodium diet and exercise as tolerated.  Instructed patient tocontinue losartan 100 mg, metoprolol succinate 50 mg once daily, and to continue to check BP twice daily at home.  Patient will submit blood pressure readings either over phone or through mychart and we can make changes as needed.  Laural Golden, PharmD, BCACP, CDCES [facility] [address] [place], [place] [address] Phone: [phone]; Fax: [phone] 03/06/2020 9:10 AM

## 2020-03-06 NOTE — Patient Instructions (Signed)
It was good seeing you again today!  Your blood pressure readings in the room today were great (121/68 and 116/80).  Your goal is still less than 130/80  Continue to monitor your blood pressure at home twice daily  Continue your losartan 100 mg daily in the morning and your metoprolol 50 mg at night.  Send over your blood pressure readings either over the phone or through my chart and we can make a decision from there if we need to make any changes.  Laural Golden, PharmD, BCACP, CDCES [facility] [address], [place], [place] [address] Phone: [phone]; Fax: [phone] 03/06/2020 8:53 AM

## 2020-03-14 NOTE — Telephone Encounter (Signed)
Spoke with patient over the phone.  Blood pressure has improved with occasional readings above goal.  Patient will continue to monitor BP at home for 1 week and report results.  Will schedule recheck if needed if readings still above goal

## 2020-04-02 ENCOUNTER — Telehealth: Payer: Self-pay | Admitting: Pharmacist

## 2020-04-02 MED ORDER — IRBESARTAN 300 MG PO TABS: 300.0000 mg | ORAL_TABLET | Freq: Every day | ORAL | 11 refills | Status: DC

## 2020-04-02 NOTE — Telephone Encounter (Signed)
Called pt to discuss BP readings sent in MyChart message. She has been having continued back pain and can't exercise or walk well because of this. She is also stressed due to son's cancer diagnosis.   Will stop losartan 100mg  and change to irbesartan 300mg  daily which is more effective in BP lowering. Pt will continue on other meds including Toprol 50mg  daily.    Encouraged pt to monitor BP at home and she can send updated readings in another week or two via MyChart.

## 2020-04-04 ENCOUNTER — Other Ambulatory Visit: Payer: Self-pay | Admitting: Internal Medicine

## 2020-04-04 DIAGNOSIS — Z1231 Encounter for screening mammogram for malignant neoplasm of breast: Secondary | ICD-10-CM

## 2020-04-13 ENCOUNTER — Telehealth: Payer: Self-pay | Admitting: Pharmacist

## 2020-04-13 NOTE — Telephone Encounter (Signed)
Spoke with patient regarding BP results received via mychart.  Patient is on irbesartan 300mg  once daily in the morning and toprol xl 50 mg once daily in the evening which she takes between 6 and 8pm.  Takes blood pressure between 8 and 10 pm.  Morning BP readings are at goal.  PM readings slightly above goal.  Patient will take metoprolol earlier in the day for the next week to see if PM readings decrease.

## 2020-05-21 ENCOUNTER — Ambulatory Visit: Payer: Medicare Other

## 2020-06-20 ENCOUNTER — Ambulatory Visit
Admission: RE | Admit: 2020-06-20 | Discharge: 2020-06-20 | Disposition: A | Discharge status: Home / Self Care | Payer: Medicare Other | Source: Ambulatory Visit | Attending: Internal Medicine | Admitting: Internal Medicine

## 2020-06-20 ENCOUNTER — Other Ambulatory Visit: Payer: Self-pay

## 2020-06-20 DIAGNOSIS — Z1231 Encounter for screening mammogram for malignant neoplasm of breast: Secondary | ICD-10-CM

## 2020-07-12 ENCOUNTER — Other Ambulatory Visit: Payer: Self-pay | Admitting: Cardiovascular Disease

## 2020-08-13 DIAGNOSIS — M48062 Spinal stenosis, lumbar region with neurogenic claudication: Secondary | ICD-10-CM | POA: Diagnosis not present

## 2020-08-13 DIAGNOSIS — I10 Essential (primary) hypertension: Secondary | ICD-10-CM | POA: Diagnosis not present

## 2020-08-13 DIAGNOSIS — Z6829 Body mass index (BMI) 29.0-29.9, adult: Secondary | ICD-10-CM | POA: Diagnosis not present

## 2020-08-21 DIAGNOSIS — M5416 Radiculopathy, lumbar region: Secondary | ICD-10-CM | POA: Diagnosis not present

## 2020-08-21 DIAGNOSIS — M48062 Spinal stenosis, lumbar region with neurogenic claudication: Secondary | ICD-10-CM | POA: Diagnosis not present

## 2020-08-27 DIAGNOSIS — I2510 Atherosclerotic heart disease of native coronary artery without angina pectoris: Secondary | ICD-10-CM | POA: Diagnosis not present

## 2020-08-27 DIAGNOSIS — E7800 Pure hypercholesterolemia, unspecified: Secondary | ICD-10-CM | POA: Diagnosis not present

## 2020-08-27 DIAGNOSIS — G4700 Insomnia, unspecified: Secondary | ICD-10-CM | POA: Diagnosis not present

## 2020-08-27 DIAGNOSIS — I10 Essential (primary) hypertension: Secondary | ICD-10-CM | POA: Diagnosis not present

## 2020-09-13 NOTE — Progress Notes (Signed)
Chief Complaint  Patient presents with   Follow-up    CAD    History of Present Illness: 85 yo female with history of CAD, ischemic cardiomyopathy, HTN, HLD and spinal stenosis here today for cardiac follow up. She was admitted to [facility] in August 2018 with an MI and had a drug eluting stent placed in the LAD. LVEF at the time of her MI was 35-40% but improved to 55-60% on echo here in our office September 2018. Cardiac monitor July 2020 with sinus and PACs. Echo December 2020 with LVEF=60-65%, normal RV function. No valve disease. She has been seen in our HTN clinic.   She is here today for follow up. The patient denies any chest pain, dyspnea, palpitations, lower extremity edema, orthopnea, PND, dizziness, near syncope or syncope.   Primary Care Physician: Renford Dills, MD  Past Medical History:  Diagnosis Date   Cholecystitis    Coronary artery disease    a. 03/2017: 80-90% LAD stenosis (PCI/DES placement with a 2.75x16 mm Promus Premier stent). No significant stenosis along RCA or LCx.    DJD (degenerative joint disease)    Hyperlipidemia    Hypertension    dx s/p MI    Ischemic cardiomyopathy    Myocardial infarction [facility]) 03/17/2017   03-2017 treated at [facility]    Osteoporosis    Status post insertion of drug-eluting stent into left anterior descending (LAD) artery 03/2017    Past Surgical History:  Procedure Laterality Date   APPENDECTOMY     CESAREAN SECTION     CHOLECYSTECTOMY N/A 11/05/2017   Procedure: LAPAROSCOPIC CHOLECYSTECTOMY WITH INTRAOPERATIVE CHOLANGIOGRAM;  Surgeon: Darnell Level, MD;  Location: [facility];  Service: General;  Laterality: N/A;   ERCP N/A 11/11/2017   Procedure: ENDOSCOPIC RETROGRADE CHOLANGIOPANCREATOGRAPHY (ERCP);  Surgeon: Willis Modena, MD;  Location: [facility];  Service: Endoscopy;  Laterality: N/A;  possible   EUS N/A 11/11/2017   Procedure: UPPER ENDOSCOPIC ULTRASOUND (EUS) RADIAL;   Surgeon: Willis Modena, MD;  Location: [facility];  Service: Endoscopy;  Laterality: N/A;   FEMUR IM NAIL Right 07/13/2019   Procedure: INTRAMEDULLARY (IM) RETROGRADE FEMORAL NAILING;  Surgeon: Ollen Gross, MD;  Location: [facility];  Service: Orthopedics;  Laterality: Right;    stent  Left 03/2017   anterior descending ; drug eluting ; tx of MI at [facility]    TONSILLECTOMY     VAGINAL HYSTERECTOMY     vaginal sling      Current Outpatient Medications  Medication Sig Dispense Refill   acetaminophen (TYLENOL) 500 MG tablet Take 1,000 mg by mouth every 8 (eight) hours as needed for moderate pain or headache.      alendronate (FOSAMAX) 70 MG tablet Take 70 mg by mouth once a week. Mondays     aspirin EC 81 MG tablet Take 81 mg by mouth daily. Swallow whole.     atorvastatin (LIPITOR) 80 MG tablet Take 1 tablet (80 mg total) by mouth daily. Needs follow up appointment for further refills. 90 tablet 0   calcium carbonate (OSCAL) 1500 (600 Ca) MG TABS tablet Take 600 mg of elemental calcium by mouth 2 (two) times daily with a meal.     cetirizine (ZYRTEC) 10 MG tablet Take 10 mg by mouth daily as needed for allergies.      Cholecalciferol (VITAMIN D3) 2000 units capsule Take 2,000 Units by mouth daily.     CRANBERRY EXTRACT PO Take 1 capsule by  mouth daily.     irbesartan (AVAPRO) 300 MG tablet Take 1 tablet (300 mg total) by mouth daily. 30 tablet 11   metoprolol succinate (TOPROL-XL) 50 MG 24 hr tablet Take 1 tablet (50 mg total) by mouth daily. Needs follow up appointment for further refills. 90 tablet 0   Multiple Vitamin (MULTI-VITAMINS) TABS Take 1 tablet by mouth daily.     nitroGLYCERIN (NITROSTAT) 0.4 MG SL tablet Place 1 tablet (0.4 mg total) under the tongue every 5 (five) minutes as needed for chest pain. 25 tablet 3   Omega-3 Fatty Acids (FISH OIL) 1200 MG CAPS Take 1,200 mg by mouth 2 (two) times daily.      omeprazole (PRILOSEC) 20 MG  capsule Take 20 mg by mouth daily.     Polyethyl Glycol-Propyl Glycol (SYSTANE OP) Place 1 drop into both eyes daily as needed (dry eyes).     Probiotic Product (TRUNATURE DIGESTIVE PROBIOTIC) CAPS Take 1 capsule by mouth daily.     vitamin C (ASCORBIC ACID) 500 MG tablet Take 500 mg by mouth daily.     No current facility-administered medications for this visit.   Facility-Administered Medications Ordered in Other Visits  Medication Dose Route Frequency Provider Last Rate Last Admin   indomethacin (INDOCIN) 50 MG suppository 50 mg  50 mg Rectal Once Willis Modena, MD        No Known Allergies  Social History   Socioeconomic History   Marital status: Widowed    Spouse name: Not on file   Number of children: 4   Years of education: Not on file   Highest education level: Not on file  Occupational History   Occupation: Homemaker   Occupation: [employer]   Tobacco Use   Smoking status: Former Smoker    Packs/day: 1.00    Years: 30.00    Pack years: 30.00    Types: Cigarettes   Smokeless tobacco: Never Used   Tobacco comment: quit 30 years   Vaping Use   Vaping Use: Never used  Substance and Sexual Activity   Alcohol use: Yes    Comment: Social   Drug use: Never   Sexual activity: Not on file  Other Topics Concern   Not on file  Social History Narrative   Not on file   Social Determinants of Health   Financial Resource Strain: Not on file  Food Insecurity: Not on file  Transportation Needs: Not on file  Physical Activity: Not on file  Stress: Not on file  Social Connections: Not on file  Intimate Partner Violence: Not on file    Family History  Problem Relation Age of Onset   Heart disease Father    Breast cancer Sister    Pancreatic cancer Sister     Review of Systems:  As stated in the HPI and otherwise negative.   BP 114/80    Pulse 76    Ht 5\' 2"  (1.575 m)    Wt 165 lb 3.2 oz (74.9 kg)    SpO2 98%    BMI  30.22 kg/m   Physical Examination:  General: Well developed, well nourished, NAD  HEENT: OP clear, mucus membranes moist  SKIN: warm, dry. No rashes. Neuro: No focal deficits  Musculoskeletal: Muscle strength 5/5 all ext  Psychiatric: Mood and affect normal  Neck: No JVD, no carotid bruits, no thyromegaly, no lymphadenopathy.  Lungs:Clear bilaterally, no wheezes, rhonci, crackles Cardiovascular: Regular rate and rhythm. No murmurs, gallops or rubs. Abdomen:Soft. Bowel sounds present.  Non-tender.  Extremities: No lower extremity edema. Pulses are 2 + in the bilateral DP/PT.  EKG:  EKG is ordered today. The ekg ordered today demonstrates Sinus  Echo December 2020: 1. Left ventricular ejection fraction, by visual estimation, is 60 to  65%. The left ventricle has normal function. There is no left ventricular  hypertrophy.  2. Global right ventricle has normal systolic function.The right  ventricular size is mildly enlarged. No increase in right ventricular wall  thickness.  3. Left atrial size was normal.  4. Right atrial size was normal.  5. Mild mitral annular calcification.  6. The mitral valve is normal in structure. Trace mitral valve  regurgitation. No evidence of mitral stenosis.  7. The tricuspid valve is normal in structure. Tricuspid valve  regurgitation is mild.  8. The aortic valve is tricuspid. Aortic valve regurgitation is not  visualized. No evidence of aortic valve sclerosis or stenosis.  9. The pulmonic valve was grossly normal. Pulmonic valve regurgitation is  trivial.  10. Moderately elevated pulmonary artery systolic pressure.  11. The inferior vena cava is normal in size with greater than 50%  respiratory variability, suggesting right atrial pressure of 3 mmHg.   Recent Labs: 02/07/2020: BUN 13; Creatinine, Ser 0.84; Potassium 5.0; Sodium 137   Lipid Panel    Component Value Date/Time   CHOL 120 07/09/2017 0850   TRIG 207 (H) 07/09/2017 0850    HDL 42 07/09/2017 0850   CHOLHDL 2.9 07/09/2017 0850   LDLCALC 37 07/09/2017 0850     Wt Readings from Last 3 Encounters:  09/14/20 165 lb 3.2 oz (74.9 kg)  03/06/20 158 lb 3.2 oz (71.8 kg)  07/16/19 190 lb 0.6 oz (86.2 kg)     Other studies Reviewed: Additional studies/ records that were reviewed today include: . Review of the above records demonstrates:    Assessment and Plan:   1. CAD without angina: She had a drug eluting stent placed in the LAD in August 2018. She has no chest pain. Will continue ASA, statin and beta blocker.     2. Ischemic cardiomyopathy: Normal LV systolic function by echo December 2020  3. Hyperlipidemia: Lipids followed in primary care. LDL 69 September 2021. Will continue statin.      4. HTN: BP is controlled. Continue current therapy  Current medicines are reviewed at length with the patient today.  The patient does not have concerns regarding medicines.  The following changes have been made:  no change  Labs/ tests ordered today include:   Orders Placed This Encounter  Procedures   EKG 12-Lead     Disposition:   FU with me in 12 months   Signed, Verne Carrow, MD 09/14/2020 10:32 AM    [facility] [address] [place], [place], [place]  [address] Phone: [phone]; Fax: [phone]

## 2020-09-14 ENCOUNTER — Other Ambulatory Visit: Payer: Self-pay

## 2020-09-14 ENCOUNTER — Encounter: Payer: Self-pay | Admitting: Cardiovascular Disease

## 2020-09-14 ENCOUNTER — Ambulatory Visit: Payer: Medicare Other | Admitting: Cardiovascular Disease

## 2020-09-14 VITALS — BP 114/80 | HR 76 | Ht 62.0 in | Wt 165.2 lb

## 2020-09-14 DIAGNOSIS — I10 Essential (primary) hypertension: Secondary | ICD-10-CM | POA: Diagnosis not present

## 2020-09-14 DIAGNOSIS — I255 Ischemic cardiomyopathy: Secondary | ICD-10-CM | POA: Diagnosis not present

## 2020-09-14 DIAGNOSIS — I2510 Atherosclerotic heart disease of native coronary artery without angina pectoris: Secondary | ICD-10-CM | POA: Diagnosis not present

## 2020-09-14 DIAGNOSIS — E785 Hyperlipidemia, unspecified: Secondary | ICD-10-CM

## 2020-09-14 NOTE — Patient Instructions (Signed)

## 2020-09-18 DIAGNOSIS — M545 Low back pain, unspecified: Secondary | ICD-10-CM | POA: Diagnosis not present

## 2020-09-18 DIAGNOSIS — M48062 Spinal stenosis, lumbar region with neurogenic claudication: Secondary | ICD-10-CM | POA: Diagnosis not present

## 2020-09-20 DIAGNOSIS — M545 Low back pain, unspecified: Secondary | ICD-10-CM | POA: Diagnosis not present

## 2020-09-20 DIAGNOSIS — M48062 Spinal stenosis, lumbar region with neurogenic claudication: Secondary | ICD-10-CM | POA: Diagnosis not present

## 2020-09-25 DIAGNOSIS — M545 Low back pain, unspecified: Secondary | ICD-10-CM | POA: Diagnosis not present

## 2020-09-25 DIAGNOSIS — M48062 Spinal stenosis, lumbar region with neurogenic claudication: Secondary | ICD-10-CM | POA: Diagnosis not present

## 2020-10-02 DIAGNOSIS — M545 Low back pain, unspecified: Secondary | ICD-10-CM | POA: Diagnosis not present

## 2020-10-02 DIAGNOSIS — M48062 Spinal stenosis, lumbar region with neurogenic claudication: Secondary | ICD-10-CM | POA: Diagnosis not present

## 2020-10-05 DIAGNOSIS — R8279 Other abnormal findings on microbiological examination of urine: Secondary | ICD-10-CM | POA: Diagnosis not present

## 2020-10-05 DIAGNOSIS — N3020 Other chronic cystitis without hematuria: Secondary | ICD-10-CM | POA: Diagnosis not present

## 2020-10-09 DIAGNOSIS — M545 Low back pain, unspecified: Secondary | ICD-10-CM | POA: Diagnosis not present

## 2020-10-09 DIAGNOSIS — M48062 Spinal stenosis, lumbar region with neurogenic claudication: Secondary | ICD-10-CM | POA: Diagnosis not present

## 2020-10-11 DIAGNOSIS — N390 Urinary tract infection, site not specified: Secondary | ICD-10-CM | POA: Diagnosis not present

## 2020-10-11 DIAGNOSIS — E7800 Pure hypercholesterolemia, unspecified: Secondary | ICD-10-CM | POA: Diagnosis not present

## 2020-10-11 DIAGNOSIS — I2510 Atherosclerotic heart disease of native coronary artery without angina pectoris: Secondary | ICD-10-CM | POA: Diagnosis not present

## 2020-10-11 DIAGNOSIS — I10 Essential (primary) hypertension: Secondary | ICD-10-CM | POA: Diagnosis not present

## 2020-10-11 DIAGNOSIS — Z1389 Encounter for screening for other disorder: Secondary | ICD-10-CM | POA: Diagnosis not present

## 2020-10-11 DIAGNOSIS — Z0000 Encounter for general adult medical examination without abnormal findings: Secondary | ICD-10-CM | POA: Diagnosis not present

## 2020-10-15 ENCOUNTER — Other Ambulatory Visit: Payer: Self-pay | Admitting: Internal Medicine

## 2020-10-15 DIAGNOSIS — M545 Low back pain, unspecified: Secondary | ICD-10-CM | POA: Diagnosis not present

## 2020-10-15 DIAGNOSIS — M48062 Spinal stenosis, lumbar region with neurogenic claudication: Secondary | ICD-10-CM | POA: Diagnosis not present

## 2020-10-15 DIAGNOSIS — M8580 Other specified disorders of bone density and structure, unspecified site: Secondary | ICD-10-CM

## 2020-10-19 DIAGNOSIS — M48062 Spinal stenosis, lumbar region with neurogenic claudication: Secondary | ICD-10-CM | POA: Diagnosis not present

## 2020-10-19 DIAGNOSIS — M545 Low back pain, unspecified: Secondary | ICD-10-CM | POA: Diagnosis not present

## 2020-10-26 DIAGNOSIS — N3020 Other chronic cystitis without hematuria: Secondary | ICD-10-CM | POA: Diagnosis not present

## 2020-10-26 DIAGNOSIS — M545 Low back pain, unspecified: Secondary | ICD-10-CM | POA: Diagnosis not present

## 2020-10-26 DIAGNOSIS — M48062 Spinal stenosis, lumbar region with neurogenic claudication: Secondary | ICD-10-CM | POA: Diagnosis not present

## 2020-10-31 DIAGNOSIS — M48062 Spinal stenosis, lumbar region with neurogenic claudication: Secondary | ICD-10-CM | POA: Diagnosis not present

## 2020-10-31 DIAGNOSIS — M545 Low back pain, unspecified: Secondary | ICD-10-CM | POA: Diagnosis not present

## 2020-11-07 DIAGNOSIS — E7800 Pure hypercholesterolemia, unspecified: Secondary | ICD-10-CM | POA: Diagnosis not present

## 2020-11-07 DIAGNOSIS — I10 Essential (primary) hypertension: Secondary | ICD-10-CM | POA: Diagnosis not present

## 2020-11-07 DIAGNOSIS — I2510 Atherosclerotic heart disease of native coronary artery without angina pectoris: Secondary | ICD-10-CM | POA: Diagnosis not present

## 2020-11-07 DIAGNOSIS — G4700 Insomnia, unspecified: Secondary | ICD-10-CM | POA: Diagnosis not present

## 2020-11-08 DIAGNOSIS — M545 Low back pain, unspecified: Secondary | ICD-10-CM | POA: Diagnosis not present

## 2020-11-08 DIAGNOSIS — M48062 Spinal stenosis, lumbar region with neurogenic claudication: Secondary | ICD-10-CM | POA: Diagnosis not present

## 2020-11-20 DIAGNOSIS — M545 Low back pain, unspecified: Secondary | ICD-10-CM | POA: Diagnosis not present

## 2020-11-20 DIAGNOSIS — M48062 Spinal stenosis, lumbar region with neurogenic claudication: Secondary | ICD-10-CM | POA: Diagnosis not present

## 2020-11-29 DIAGNOSIS — D225 Melanocytic nevi of trunk: Secondary | ICD-10-CM | POA: Diagnosis not present

## 2020-11-29 DIAGNOSIS — L819 Disorder of pigmentation, unspecified: Secondary | ICD-10-CM | POA: Diagnosis not present

## 2020-11-29 DIAGNOSIS — L723 Sebaceous cyst: Secondary | ICD-10-CM | POA: Diagnosis not present

## 2020-11-29 DIAGNOSIS — D2261 Melanocytic nevi of right upper limb, including shoulder: Secondary | ICD-10-CM | POA: Diagnosis not present

## 2020-12-10 DIAGNOSIS — G4700 Insomnia, unspecified: Secondary | ICD-10-CM | POA: Diagnosis not present

## 2020-12-10 DIAGNOSIS — I10 Essential (primary) hypertension: Secondary | ICD-10-CM | POA: Diagnosis not present

## 2020-12-10 DIAGNOSIS — I2510 Atherosclerotic heart disease of native coronary artery without angina pectoris: Secondary | ICD-10-CM | POA: Diagnosis not present

## 2020-12-10 DIAGNOSIS — E7800 Pure hypercholesterolemia, unspecified: Secondary | ICD-10-CM | POA: Diagnosis not present

## 2020-12-11 DIAGNOSIS — M48062 Spinal stenosis, lumbar region with neurogenic claudication: Secondary | ICD-10-CM | POA: Diagnosis not present

## 2020-12-11 DIAGNOSIS — M545 Low back pain, unspecified: Secondary | ICD-10-CM | POA: Diagnosis not present

## 2020-12-17 MED ORDER — NITROGLYCERIN 0.4 MG SL SUBL: 0.4000 mg | SUBLINGUAL_TABLET | SUBLINGUAL | 7 refills | Status: DC | PRN

## 2021-01-15 DIAGNOSIS — N390 Urinary tract infection, site not specified: Secondary | ICD-10-CM | POA: Diagnosis not present

## 2021-01-15 DIAGNOSIS — M48062 Spinal stenosis, lumbar region with neurogenic claudication: Secondary | ICD-10-CM | POA: Diagnosis not present

## 2021-01-15 DIAGNOSIS — I2510 Atherosclerotic heart disease of native coronary artery without angina pectoris: Secondary | ICD-10-CM | POA: Diagnosis not present

## 2021-01-15 DIAGNOSIS — R5383 Other fatigue: Secondary | ICD-10-CM | POA: Diagnosis not present

## 2021-01-15 DIAGNOSIS — R600 Localized edema: Secondary | ICD-10-CM | POA: Diagnosis not present

## 2021-01-15 DIAGNOSIS — M545 Low back pain, unspecified: Secondary | ICD-10-CM | POA: Diagnosis not present

## 2021-01-21 DIAGNOSIS — N3020 Other chronic cystitis without hematuria: Secondary | ICD-10-CM | POA: Diagnosis not present

## 2021-01-29 DIAGNOSIS — M545 Low back pain, unspecified: Secondary | ICD-10-CM | POA: Diagnosis not present

## 2021-01-29 DIAGNOSIS — M48062 Spinal stenosis, lumbar region with neurogenic claudication: Secondary | ICD-10-CM | POA: Diagnosis not present

## 2021-02-08 ENCOUNTER — Telehealth: Payer: Self-pay | Admitting: Cardiovascular Disease

## 2021-02-08 NOTE — Telephone Encounter (Signed)
Spoke with pt who reports bilateral pedal edema. She states they have been swollen for a couple of days. They do not hurt. She denies SOB, CP, cough, weight gain or abdominal swelling.   She was advised to avoid sodium for the weekend. Stay hydrated. Wear compression stockings and elevate her feet whenever sitting. She will call the on call provider if her swelling worsens or begins to hurt. If she sees no improvement, she will call the office on Tuesday for further recommendation.

## 2021-02-08 NOTE — Telephone Encounter (Signed)
    Pt c/o swelling: STAT is pt has developed SOB within 24 hours  If swelling, where is the swelling located? Both feet and ankles  How much weight have you gained and in what time span? no  Have you gained 3 pounds in a day or 5 pounds in a week? no  Do you have a log of your daily weights (if so, list)? no  Are you currently taking a fluid pill? No  Are you currently SOB? No  Have you traveled recently? No  Pt said her feet and ankles are swelling and she never had this before. Denied weight gain and sob. She wanted to know what she needs to do

## 2021-02-26 DIAGNOSIS — M545 Low back pain, unspecified: Secondary | ICD-10-CM | POA: Diagnosis not present

## 2021-02-26 DIAGNOSIS — M48062 Spinal stenosis, lumbar region with neurogenic claudication: Secondary | ICD-10-CM | POA: Diagnosis not present

## 2021-03-02 DIAGNOSIS — R3989 Other symptoms and signs involving the genitourinary system: Secondary | ICD-10-CM | POA: Diagnosis not present

## 2021-03-02 DIAGNOSIS — N390 Urinary tract infection, site not specified: Secondary | ICD-10-CM | POA: Diagnosis not present

## 2021-03-08 DIAGNOSIS — N3020 Other chronic cystitis without hematuria: Secondary | ICD-10-CM | POA: Diagnosis not present

## 2021-03-08 DIAGNOSIS — N139 Obstructive and reflux uropathy, unspecified: Secondary | ICD-10-CM | POA: Diagnosis not present

## 2021-03-12 DIAGNOSIS — N3000 Acute cystitis without hematuria: Secondary | ICD-10-CM | POA: Diagnosis not present

## 2021-03-18 DIAGNOSIS — H26492 Other secondary cataract, left eye: Secondary | ICD-10-CM | POA: Diagnosis not present

## 2021-03-18 DIAGNOSIS — Z961 Presence of intraocular lens: Secondary | ICD-10-CM | POA: Diagnosis not present

## 2021-03-18 DIAGNOSIS — H40023 Open angle with borderline findings, high risk, bilateral: Secondary | ICD-10-CM | POA: Diagnosis not present

## 2021-03-18 DIAGNOSIS — H04123 Dry eye syndrome of bilateral lacrimal glands: Secondary | ICD-10-CM | POA: Diagnosis not present

## 2021-03-26 DIAGNOSIS — M48062 Spinal stenosis, lumbar region with neurogenic claudication: Secondary | ICD-10-CM | POA: Diagnosis not present

## 2021-03-26 DIAGNOSIS — M545 Low back pain, unspecified: Secondary | ICD-10-CM | POA: Diagnosis not present

## 2021-04-04 ENCOUNTER — Other Ambulatory Visit: Payer: Self-pay

## 2021-04-04 ENCOUNTER — Ambulatory Visit
Admission: RE | Admit: 2021-04-04 | Discharge: 2021-04-04 | Disposition: A | Discharge status: Home / Self Care | Payer: Medicare Other | Source: Ambulatory Visit | Attending: Internal Medicine | Admitting: Internal Medicine

## 2021-04-04 DIAGNOSIS — M8589 Other specified disorders of bone density and structure, multiple sites: Secondary | ICD-10-CM | POA: Diagnosis not present

## 2021-04-04 DIAGNOSIS — M8580 Other specified disorders of bone density and structure, unspecified site: Secondary | ICD-10-CM

## 2021-04-04 DIAGNOSIS — Z780 Asymptomatic menopausal state: Secondary | ICD-10-CM | POA: Diagnosis not present

## 2021-04-16 DIAGNOSIS — N390 Urinary tract infection, site not specified: Secondary | ICD-10-CM | POA: Diagnosis not present

## 2021-04-16 DIAGNOSIS — E7800 Pure hypercholesterolemia, unspecified: Secondary | ICD-10-CM | POA: Diagnosis not present

## 2021-04-16 DIAGNOSIS — G4700 Insomnia, unspecified: Secondary | ICD-10-CM | POA: Diagnosis not present

## 2021-04-16 DIAGNOSIS — I10 Essential (primary) hypertension: Secondary | ICD-10-CM | POA: Diagnosis not present

## 2021-04-16 DIAGNOSIS — M545 Low back pain, unspecified: Secondary | ICD-10-CM | POA: Diagnosis not present

## 2021-04-29 DIAGNOSIS — H401122 Primary open-angle glaucoma, left eye, moderate stage: Secondary | ICD-10-CM | POA: Diagnosis not present

## 2021-05-16 ENCOUNTER — Emergency Department (HOSPITAL_COMMUNITY): Payer: Medicare Other

## 2021-05-16 ENCOUNTER — Emergency Department (HOSPITAL_COMMUNITY)
Admission: EM | Admit: 2021-05-16 | Discharge: 2021-05-16 | Disposition: A | Discharge status: Home / Self Care | Payer: Medicare Other | Attending: Emergency Medicine | Admitting: Emergency Medicine

## 2021-05-16 ENCOUNTER — Encounter (HOSPITAL_COMMUNITY): Payer: Self-pay

## 2021-05-16 DIAGNOSIS — I10 Essential (primary) hypertension: Secondary | ICD-10-CM | POA: Insufficient documentation

## 2021-05-16 DIAGNOSIS — I517 Cardiomegaly: Secondary | ICD-10-CM | POA: Diagnosis not present

## 2021-05-16 DIAGNOSIS — Z87891 Personal history of nicotine dependence: Secondary | ICD-10-CM | POA: Insufficient documentation

## 2021-05-16 DIAGNOSIS — Z79899 Other long term (current) drug therapy: Secondary | ICD-10-CM | POA: Diagnosis not present

## 2021-05-16 DIAGNOSIS — I2510 Atherosclerotic heart disease of native coronary artery without angina pectoris: Secondary | ICD-10-CM | POA: Diagnosis not present

## 2021-05-16 DIAGNOSIS — Z7982 Long term (current) use of aspirin: Secondary | ICD-10-CM | POA: Diagnosis not present

## 2021-05-16 LAB — CBC WITH DIFFERENTIAL/PLATELET
Abs Immature Granulocytes: 0.02 10*3/uL (ref 0.00–0.07)
Basophils Absolute: 0.1 10*3/uL (ref 0.0–0.1)
Basophils Relative: 1 %
Eosinophils Absolute: 0.1 10*3/uL (ref 0.0–0.5)
Eosinophils Relative: 1 %
HCT: 33.5 % — ABNORMAL LOW (ref 36.0–46.0)
Hemoglobin: 11.1 g/dL — ABNORMAL LOW (ref 12.0–15.0)
Immature Granulocytes: 0 %
Lymphocytes Relative: 37 %
Lymphs Abs: 2.4 10*3/uL (ref 0.7–4.0)
MCH: 29.8 pg (ref 26.0–34.0)
MCHC: 33.1 g/dL (ref 30.0–36.0)
MCV: 90.1 fL (ref 80.0–100.0)
Monocytes Absolute: 0.6 10*3/uL (ref 0.1–1.0)
Monocytes Relative: 10 %
Neutro Abs: 3.2 10*3/uL (ref 1.7–7.7)
Neutrophils Relative %: 51 %
Platelets: 158 10*3/uL (ref 150–400)
RBC: 3.72 MIL/uL — ABNORMAL LOW (ref 3.87–5.11)
RDW: 12.2 % (ref 11.5–15.5)
WBC: 6.3 10*3/uL (ref 4.0–10.5)
nRBC: 0 % (ref 0.0–0.2)

## 2021-05-16 LAB — BASIC METABOLIC PANEL
Anion gap: 7 (ref 5–15)
BUN: 9 mg/dL (ref 8–23)
CO2: 28 mmol/L (ref 22–32)
Calcium: 9 mg/dL (ref 8.9–10.3)
Chloride: 105 mmol/L (ref 98–111)
Creatinine, Ser: 0.76 mg/dL (ref 0.44–1.00)
GFR, Estimated: >60 mL/min (ref >60)
Glucose, Bld: 111 mg/dL — ABNORMAL HIGH (ref 70–99)
Potassium: 3.6 mmol/L (ref 3.5–5.1)
Sodium: 140 mmol/L (ref 135–145)

## 2021-05-16 LAB — TROPONIN I (HIGH SENSITIVITY): Troponin I (High Sensitivity): 10 ng/L (ref <18)

## 2021-05-16 MED ORDER — HYDRALAZINE HCL 20 MG/ML IJ SOLN: 5.0000 mg | Freq: Once | INTRAMUSCULAR | Status: DC

## 2021-05-16 MED ORDER — HYDROCHLOROTHIAZIDE 25 MG PO TABS: 25.0000 mg | ORAL_TABLET | Freq: Every day | ORAL | 0 refills | Status: DC

## 2021-05-16 MED ORDER — LISINOPRIL 10 MG PO TABS
10.0000 mg | ORAL_TABLET | Freq: Once | ORAL | Status: AC
  Administered 2021-05-16: 10 mg via ORAL
  Filled 2021-05-16: qty 1

## 2021-05-16 NOTE — ED Provider Notes (Addendum)
[facility]-EMERGENCY DEPT Provider Note   CSN: [id number] Arrival date & time: 05/16/21  0932     History Chief Complaint  Patient presents with   Hypertension    Lorilei Horan is a 85 y.o. female hx of CAD s/p stent, HTN, HL, here presenting with hypertension.  Patient states that for the last week or so her blood pressure has been running in the 170s.  She states that since yesterday she noticed some pain in the right scapula and right side of her chest.  She woke up this morning and she noticed her blood pressure went up to the 190s.  She denies stabbing chest pain to the back.  She states that this morning she did not have any chest pain and the pain happened yesterday.  She denies any headache or vomiting.  Denies any abdominal pain.  She states that this is not similar to her previous heart attack  The history is provided by the patient.      Past Medical History:  Diagnosis Date   Cholecystitis    Coronary artery disease    a. 03/2017: 80-90% LAD stenosis (PCI/DES placement with a 2.75x16 mm Promus Premier stent). No significant stenosis along RCA or LCx.    DJD (degenerative joint disease)    Hyperlipidemia    Hypertension    dx s/p MI    Ischemic cardiomyopathy    Myocardial infarction (HCC) 03/17/2017   03-2017 treated at [facility]    Osteoporosis    Status post insertion of drug-eluting stent into left anterior descending (LAD) artery 03/2017    Patient Active Problem List   Diagnosis Date Noted   Closed intertrochanteric fracture of right hip (HCC) 07/11/2019   Closed right hip fracture (HCC) 07/11/2019   Chronic cholecystitis due to cholelithiasis with choledocholithiasis 11/05/2017   Cholelithiasis with chronic cholecystitis 11/01/2017   Cholelithiasis 05/18/2017   CAD in native artery 03/31/2017   Ischemic cardiomyopathy 03/31/2017   Hyperlipemia 03/31/2017   DJD (degenerative joint disease) 03/31/2017    Osteoporosis 03/31/2017    Past Surgical History:  Procedure Laterality Date   APPENDECTOMY     CESAREAN SECTION     CHOLECYSTECTOMY N/A 11/05/2017   Procedure: LAPAROSCOPIC CHOLECYSTECTOMY WITH INTRAOPERATIVE CHOLANGIOGRAM;  Surgeon: Darnell Level, MD;  Location: [facility];  Service: General;  Laterality: N/A;   ERCP N/A 11/11/2017   Procedure: ENDOSCOPIC RETROGRADE CHOLANGIOPANCREATOGRAPHY (ERCP);  Surgeon: Willis Modena, MD;  Location: [facility];  Service: Endoscopy;  Laterality: N/A;  possible   EUS N/A 11/11/2017   Procedure: UPPER ENDOSCOPIC ULTRASOUND (EUS) RADIAL;  Surgeon: Willis Modena, MD;  Location: [facility];  Service: Endoscopy;  Laterality: N/A;   FEMUR IM NAIL Right 07/13/2019   Procedure: INTRAMEDULLARY (IM) RETROGRADE FEMORAL NAILING;  Surgeon: Ollen Gross, MD;  Location: [facility];  Service: Orthopedics;  Laterality: Right;    stent  Left 03/2017   anterior descending ; drug eluting ; tx of MI at [facility]    TONSILLECTOMY     VAGINAL HYSTERECTOMY     vaginal sling       OB History   No obstetric history on file.     Family History  Problem Relation Age of Onset   Heart disease Father    Breast cancer Sister    Pancreatic cancer Sister     Social History   Tobacco Use   Smoking status: Former    Packs/day: 1.00    Years: 30.00  Pack years: 30.00    Types: Cigarettes   Smokeless tobacco: Never   Tobacco comments:    quit 30 years   Vaping Use   Vaping Use: Never used  Substance Use Topics   Alcohol use: Yes    Comment: Social   Drug use: Never    Home Medications Prior to Admission medications   Medication Sig Start Date End Date Taking? Authorizing Provider  acetaminophen (TYLENOL) 500 MG tablet Take 1,000 mg by mouth every 8 (eight) hours as needed for moderate pain or headache.     [provider]  alendronate (FOSAMAX) 70 MG tablet Take 70 mg by mouth once a week. Mondays    [provider]   aspirin EC 81 MG tablet Take 81 mg by mouth daily. Swallow whole.    [provider]  atorvastatin (LIPITOR) 80 MG tablet Take 1 tablet (80 mg total) by mouth daily. Needs follow up appointment for further refills. 07/13/20   Kathleene Hazel, MD  calcium carbonate (OSCAL) 1500 (600 Ca) MG TABS tablet Take 600 mg of elemental calcium by mouth 2 (two) times daily with a meal.    [provider]  cetirizine (ZYRTEC) 10 MG tablet Take 10 mg by mouth daily as needed for allergies.     [provider]  Cholecalciferol (VITAMIN D3) 2000 units capsule Take 2,000 Units by mouth daily.    [provider]  CRANBERRY EXTRACT PO Take 1 capsule by mouth daily.    [provider]  irbesartan (AVAPRO) 300 MG tablet Take 1 tablet (300 mg total) by mouth daily. 04/02/20   Kathleene Hazel, MD  metoprolol succinate (TOPROL-XL) 50 MG 24 hr tablet Take 1 tablet (50 mg total) by mouth daily. Needs follow up appointment for further refills. 07/13/20   Kathleene Hazel, MD  Multiple Vitamin (MULTI-VITAMINS) TABS Take 1 tablet by mouth daily.    [provider]  nitroGLYCERIN (NITROSTAT) 0.4 MG SL tablet Place 1 tablet (0.4 mg total) under the tongue every 5 (five) minutes as needed for chest pain. 12/17/20   Kathleene Hazel, MD  Omega-3 Fatty Acids (FISH OIL) 1200 MG CAPS Take 1,200 mg by mouth 2 (two) times daily.     [provider]  omeprazole (PRILOSEC) 20 MG capsule Take 20 mg by mouth daily.    [provider]  Polyethyl Glycol-Propyl Glycol (SYSTANE OP) Place 1 drop into both eyes daily as needed (dry eyes).    [provider]  Probiotic Product (TRUNATURE DIGESTIVE PROBIOTIC) CAPS Take 1 capsule by mouth daily.    [provider]  vitamin C (ASCORBIC ACID) 500 MG tablet Take 500 mg by mouth daily.    [provider]    Allergies    Patient has no known allergies.  Review of Systems    Review of Systems  Cardiovascular:  Positive for chest pain.  All other systems reviewed and are negative.  Physical Exam Updated Vital Signs BP (!) 181/83   Pulse 76   Temp 98.8 F (37.1 C) (Oral)   Resp 20   SpO2 95%   Physical Exam Vitals and nursing note reviewed.  Constitutional:      Comments: Comfortable but slightly anxious   HENT:     Mouth/Throat:     Mouth: Mucous membranes are moist.  Eyes:     Extraocular Movements: Extraocular movements intact.     Pupils: Pupils are equal, round, and reactive to light.  Cardiovascular:  Rate and Rhythm: Normal rate and regular rhythm.     Pulses: Normal pulses.     Heart sounds: Normal heart sounds.  Pulmonary:     Effort: Pulmonary effort is normal.     Breath sounds: Normal breath sounds.  Abdominal:     General: Abdomen is flat.     Palpations: Abdomen is soft.  Musculoskeletal:        General: Normal range of motion.     Cervical back: Normal range of motion and neck supple.     Comments: Good peripheral pulses bilaterally  Skin:    General: Skin is warm.     Capillary Refill: Capillary refill takes less than 2 seconds.  Neurological:     General: No focal deficit present.     Mental Status: She is oriented to person, place, and time.     Cranial Nerves: No cranial nerve deficit.     Sensory: No sensory deficit.     Motor: No weakness.  Psychiatric:        Mood and Affect: Mood normal.        Behavior: Behavior normal.    ED Results / Procedures / Treatments   Labs (all labs ordered are listed, but only abnormal results are displayed) Labs Reviewed  CBC WITH DIFFERENTIAL/PLATELET - Abnormal; Notable for the following components:      Result Value   RBC 3.72 (*)    Hemoglobin 11.1 (*)    HCT 33.5 (*)    All other components within normal limits  BASIC METABOLIC PANEL - Abnormal; Notable for the following components:   Glucose, Bld 111 (*)    All other components within normal limits  TROPONIN I  (HIGH SENSITIVITY)  TROPONIN I (HIGH SENSITIVITY)    EKG EKG Interpretation  Date/Time:  Thursday May 16 2021 05:03:50 EDT Ventricular Rate:  76 PR Interval:  156 QRS Duration: 92 QT Interval:  395 QTC Calculation: 445 R Axis:   -11 Text Interpretation: Sinus rhythm No significant change since last tracing Confirmed by Richardean Canal ([id number]) on 05/16/2021 5:09:51 AM  Radiology DG Chest Port 1 View  Result Date: 05/16/2021 CLINICAL DATA:  85 year old female with history of chest pain. EXAM: PORTABLE CHEST 1 VIEW COMPARISON:  Chest x-ray 07/11/2019. FINDINGS: Lung volumes are normal. No consolidative airspace disease. No pleural effusions. No pneumothorax. No pulmonary nodule or mass noted. Pulmonary vasculature is normal. Heart size is mildly enlarged. Upper mediastinal contours are within normal limits. Atherosclerosis in the thoracic aorta. IMPRESSION: 1. No radiographic evidence of acute cardiopulmonary disease. 2. Cardiomegaly. 3. Aortic atherosclerosis. Electronically Signed   By: Trudie Reed M.D.   On: 05/16/2021 06:06    Procedures Procedures   Medications Ordered in ED Medications  lisinopril (ZESTRIL) tablet 10 mg (10 mg Oral Given 05/16/21 0645)    ED Course  I have reviewed the triage vital signs and the nursing notes.  Pertinent labs & imaging results that were available during my care of the patient were reviewed by me and considered in my medical decision making (see chart for details).    MDM Rules/Calculators/A&P                           Taejah Ohalloran is a 85 y.o. female here presenting with hypertension.  Patient's blood pressure has been running in the 170s.  Patient is on metoprolol and avapro.  Patient has some vague right-sided scapular and chest pain going  on since yesterday.  The story is not consistent with dissection so if chest x-ray does not show widened mediastinum, we will not do a dissection study.  I also have low suspicion for  ACS.  I think she likely has symptomatic hypertension.  6:50 AM Chest x-ray did not show a widened mediastinum.  Troponin negative x1 and patient has right-sided chest pain this is different than previous MI.  Blood pressure stable around 170s.  Patient is on Avapro and metoprolol.  We will add HCTZ to help control her hypertension. Recommend keeping good BP log and see PCP in a week    Final Clinical Impression(s) / ED Diagnoses Final diagnoses:  None    Rx / DC Orders ED Discharge Orders     None        Charlynne Pander, MD 05/16/21 [id number]    Charlynne Pander, MD 05/16/21 [phone]

## 2021-05-16 NOTE — Discharge Instructions (Addendum)
Continue your current blood pressure medicines   Please take HCTZ in addition to your current medicines  Keep a good log of your blood pressure and recheck blood pressure with your doctor in a week  Return to ER if you have chest pain, headache, vomiting, abdominal pain

## 2021-05-16 NOTE — ED Triage Notes (Signed)
Patient is having complaints of hypertension. She took her bp at home it was 190/90. EMS got 200/120. A&Ox4 ambulatory.  Hx:HTN  Hr- 80 spo2:96% RA- resp-18

## 2021-05-17 NOTE — Telephone Encounter (Signed)
Called pt. Went to [facility] ER yesterday am for elevated BP. She takes metoprolol 50 mg at bedtime and irbesartan every am.  She will add the hctz in am as well.  She will monitor BP at home and take list w her to Dr. Nehemiah Settle visit next week.  Adv HTN clinic could see her in the future if hctz does not hep bring BP into normal range.  She is appreciative for call.

## 2021-05-21 DIAGNOSIS — R109 Unspecified abdominal pain: Secondary | ICD-10-CM | POA: Diagnosis not present

## 2021-05-21 DIAGNOSIS — N3000 Acute cystitis without hematuria: Secondary | ICD-10-CM | POA: Diagnosis not present

## 2021-05-21 DIAGNOSIS — R110 Nausea: Secondary | ICD-10-CM | POA: Diagnosis not present

## 2021-05-23 DIAGNOSIS — R109 Unspecified abdominal pain: Secondary | ICD-10-CM | POA: Diagnosis not present

## 2021-05-23 DIAGNOSIS — I10 Essential (primary) hypertension: Secondary | ICD-10-CM | POA: Diagnosis not present

## 2021-06-10 DIAGNOSIS — H401122 Primary open-angle glaucoma, left eye, moderate stage: Secondary | ICD-10-CM | POA: Diagnosis not present

## 2021-06-17 DIAGNOSIS — M25511 Pain in right shoulder: Secondary | ICD-10-CM | POA: Diagnosis not present

## 2021-06-17 DIAGNOSIS — M7541 Impingement syndrome of right shoulder: Secondary | ICD-10-CM | POA: Diagnosis not present

## 2021-07-22 ENCOUNTER — Other Ambulatory Visit: Payer: Self-pay | Admitting: Internal Medicine

## 2021-07-22 DIAGNOSIS — Z1231 Encounter for screening mammogram for malignant neoplasm of breast: Secondary | ICD-10-CM

## 2021-07-25 DIAGNOSIS — N952 Postmenopausal atrophic vaginitis: Secondary | ICD-10-CM | POA: Diagnosis not present

## 2021-07-25 DIAGNOSIS — N390 Urinary tract infection, site not specified: Secondary | ICD-10-CM | POA: Diagnosis not present

## 2021-07-25 DIAGNOSIS — N816 Rectocele: Secondary | ICD-10-CM | POA: Diagnosis not present

## 2021-07-25 DIAGNOSIS — N3281 Overactive bladder: Secondary | ICD-10-CM | POA: Diagnosis not present

## 2021-07-26 DIAGNOSIS — M25511 Pain in right shoulder: Secondary | ICD-10-CM | POA: Diagnosis not present

## 2021-08-08 DIAGNOSIS — I10 Essential (primary) hypertension: Secondary | ICD-10-CM | POA: Diagnosis not present

## 2021-08-08 DIAGNOSIS — I2510 Atherosclerotic heart disease of native coronary artery without angina pectoris: Secondary | ICD-10-CM | POA: Diagnosis not present

## 2021-08-08 DIAGNOSIS — G4700 Insomnia, unspecified: Secondary | ICD-10-CM | POA: Diagnosis not present

## 2021-08-08 DIAGNOSIS — E7800 Pure hypercholesterolemia, unspecified: Secondary | ICD-10-CM | POA: Diagnosis not present

## 2021-08-10 DIAGNOSIS — M25511 Pain in right shoulder: Secondary | ICD-10-CM | POA: Diagnosis not present

## 2021-08-15 DIAGNOSIS — M25511 Pain in right shoulder: Secondary | ICD-10-CM | POA: Diagnosis not present

## 2021-08-22 ENCOUNTER — Other Ambulatory Visit: Payer: Self-pay

## 2021-08-22 ENCOUNTER — Ambulatory Visit
Admission: RE | Admit: 2021-08-22 | Discharge: 2021-08-22 | Disposition: A | Discharge status: Home / Self Care | Payer: Medicare Other | Source: Ambulatory Visit | Attending: Internal Medicine | Admitting: Internal Medicine

## 2021-08-22 DIAGNOSIS — Z1231 Encounter for screening mammogram for malignant neoplasm of breast: Secondary | ICD-10-CM | POA: Diagnosis not present

## 2021-09-03 ENCOUNTER — Telehealth: Payer: Self-pay | Admitting: *Deleted

## 2021-09-03 DIAGNOSIS — I700 Atherosclerosis of aorta: Secondary | ICD-10-CM | POA: Diagnosis not present

## 2021-09-03 DIAGNOSIS — M75101 Unspecified rotator cuff tear or rupture of right shoulder, not specified as traumatic: Secondary | ICD-10-CM | POA: Diagnosis not present

## 2021-09-03 DIAGNOSIS — I2510 Atherosclerotic heart disease of native coronary artery without angina pectoris: Secondary | ICD-10-CM | POA: Diagnosis not present

## 2021-09-03 DIAGNOSIS — I10 Essential (primary) hypertension: Secondary | ICD-10-CM | POA: Diagnosis not present

## 2021-09-03 NOTE — Telephone Encounter (Signed)
° °  Pre-operative Risk Assessment    Patient Name: Carrie Barnes  DOB: 1936/08/07 MRN: [id number]      Request for Surgical Clearance    Procedure:   Rotator cuff repair  Date of Surgery:  Clearance TBD                                 Surgeon:  Dr Jene Every Surgeon's Group or Practice Name:  [facility] Phone number:  [phone] Fax number:  [phone] attn Cordelia Pen   Type of Clearance Requested:   - Pharmacy:  Hold Aspirin     Type of Anesthesia:  General    Additional requests/questions:    SignedValrie Hart   09/03/2021, 4:50 PM

## 2021-09-04 NOTE — Telephone Encounter (Signed)
° °  Name: Carrie Barnes  DOB: 28-Sep-1935  MRN: [id number]   Primary Cardiologist: Lauree Chandler, MD  Chart reviewed as part of pre-operative protocol coverage. Pt has a history of ischemic cardiomyopathy with last PCI in 2018. Given her CAD, we would prefer to continue ASA throughout the perioperative period. If doing so would significantly increase morbidity or mortality, may hold for 5 days.   I will route this recommendation to the requesting party via Epic fax function and remove from pre-op pool. Please call with questions.  Ledora Bottcher, PA 09/04/2021, 3:56 PM

## 2021-09-05 DIAGNOSIS — N3281 Overactive bladder: Secondary | ICD-10-CM | POA: Diagnosis not present

## 2021-09-05 DIAGNOSIS — R159 Full incontinence of feces: Secondary | ICD-10-CM | POA: Diagnosis not present

## 2021-09-05 DIAGNOSIS — N390 Urinary tract infection, site not specified: Secondary | ICD-10-CM | POA: Diagnosis not present

## 2021-09-05 DIAGNOSIS — N952 Postmenopausal atrophic vaginitis: Secondary | ICD-10-CM | POA: Diagnosis not present

## 2021-09-09 ENCOUNTER — Ambulatory Visit: Payer: Self-pay | Admitting: Orthopedic Surgery

## 2021-09-09 DIAGNOSIS — S46011D Strain of muscle(s) and tendon(s) of the rotator cuff of right shoulder, subsequent encounter: Secondary | ICD-10-CM

## 2021-09-09 NOTE — Progress Notes (Signed)
Surgery orders requested via Epic inbox. °

## 2021-09-09 NOTE — H&P (View-Only) (Signed)
Carrie Barnes is an 86 y.o. female.   °Chief Complaint: right shoulder pain °HPI: Visit For: Follow up (to discuss MRI) °Hand Dominance: right °Location: right; shoulder °Duration: months °Severity: pain level 4/10 °Alleviating Factors: No medication for her shoulder °Aggravating Factors: ROM; pain at night ° °Past Medical History:  °Diagnosis Date  ° Cholecystitis   ° Coronary artery disease   ° a. 03/2017: 80-90% LAD stenosis (PCI/DES placement with a 2.75x16 mm Promus Premier stent). No significant stenosis along RCA or LCx.   ° DJD (degenerative joint disease)   ° Hyperlipidemia   ° Hypertension   ° dx s/p MI   ° Ischemic cardiomyopathy   ° Myocardial infarction (H[facility]) 03/17/2017  ° 03-2017 treated at [facility]   ° Osteoporosis   ° Status post insertion of drug-eluting stent into left anterior descending (LAD) artery 03/2017  ° ° °Past Surgical History:  °Procedure Laterality Date  ° APPENDECTOMY    ° CESAREAN SECTION    ° CHOLECYSTECTOMY N/A 11/05/2017  ° Procedure: LAPAROSCOPIC CHOLECYSTECTOMY WITH INTRAOPERATIVE CHOLANGIOGRAM;  Surgeon: Gerkin, Todd, MD;  Location: [facility];  Service: General;  Laterality: N/A;  ° ERCP N/A 11/11/2017  ° Procedure: ENDOSCOPIC RETROGRADE CHOLANGIOPANCREATOGRAPHY (ERCP);  Surgeon: Outlaw, William, MD;  Location: [facility];  Service: Endoscopy;  Laterality: N/A;  possible  ° EUS N/A 11/11/2017  ° Procedure: UPPER ENDOSCOPIC ULTRASOUND (EUS) RADIAL;  Surgeon: Outlaw, William, MD;  Location: [facility];  Service: Endoscopy;  Laterality: N/A;  ° FEMUR IM NAIL Right 07/13/2019  ° Procedure: INTRAMEDULLARY (IM) RETROGRADE FEMORAL NAILING;  Surgeon: Aluisio, Frank, MD;  Location: [facility];  Service: Orthopedics;  Laterality: Right;  60min  ° stent  Left 03/2017  ° anterior descending ; drug eluting ; tx of MI at [facility]   ° TONSILLECTOMY    ° VAGINAL HYSTERECTOMY    ° vaginal sling    ° ° °Family History  °Problem Relation Age of Onset  ° Heart disease  Father   ° Breast cancer Sister   ° Pancreatic cancer Sister   ° °Social History:  reports that she has quit smoking. Her smoking use included cigarettes. She has a 30.00 pack-year smoking history. She has never used smokeless tobacco. She reports current alcohol use. She reports that she does not use drugs. ° °Allergies: No Known Allergies ° °Current meds: °acetaminophen 500 mg tablet °Adult Low Dose Aspirin 81 mg tablet °alendronate 70 mg tablet °ascorbic acid (vitamin C) 500 mg tablet °atorvastatin 80 mg tablet °Calcium 600 + D(3) °cetirizine 10 mg capsule °cholecalciferol (vitamin D3) 50 mcg (2,000 unit) capsule °Cranberry Concentrate °estradioL 0.01% (0.1 mg/gram) vaginal cream °irbesartan 300 mg tablet °latanoprost 0.005 % eye drops °LOSARTAN POTASSIUM 25 MG TABS °MECLIZINE HCL 12.5 MG TABS °methenamine hippurate 1 gram tablet °METOPROLOL SUCCINATE ER 50 MG TB24 °Myrbetriq 25 mg tablet,extended release °nitroglycerin 0.4 mg sublingual tablet °Omega 3 Fish OiL °omeprazole 20 mg capsule,delayed release °Probiotic °timoloL maleate 0.5 % eye drops °traZODone 50 mg tablet ° °Review of Systems  °Constitutional: Negative.   °HENT: Negative.    °Eyes: Negative.   °Respiratory: Negative.    °Cardiovascular: Negative.   °Gastrointestinal: Negative.   °Endocrine: Negative.   °Genitourinary: Negative.   °Musculoskeletal:  Positive for arthralgias and myalgias.  °Skin: Negative.   °Neurological: Negative.   ° °There were no vitals taken for this visit. °Physical Exam °Constitutional:   °   Appearance: Normal appearance.  °HENT:  °   Head:   Normocephalic.  °   Right Ear: External ear normal.  °   Left Ear: External ear normal.  °   Nose: Nose normal.  °   Mouth/Throat:  °   Pharynx: Oropharynx is clear.  °Eyes:  °   Conjunctiva/sclera: Conjunctivae normal.  °Cardiovascular:  °   Rate and Rhythm: Normal rate and regular rhythm.  °   Pulses: Normal pulses.  °Pulmonary:  °   Effort: Pulmonary effort is normal.  °Abdominal:  °    General: Bowel sounds are normal.  °Musculoskeletal:  °   Cervical back: Normal range of motion.  °   Comments: Constitutional: General Appearance: healthy-appearing and NAD. ° °Psychiatric: Mood and Affect: normal mood and affect. ° °Cardiovascular System: Arterial Pulses Right: radial normal and brachial normal. Varicosities Right: no varicosities. ° °C-Spine/Neck: Active Range of Motion: flexion normal, extension normal, and no pain elicited on motion. ° °Shoulders: Inspection Right: no misalignment, atrophy, erythema, swelling, or scapular winging. Bony Palpation Right: no tenderness of the sternoclavicular joint, the coracoid process, the acromioclavicular joint, the bicipital groove, or the scapula. Soft Tissue Palpation Right: tenderness of the supraspinatus and the subacromial bursa. Active Range of Motion Right: limited. Special Tests Right: Speed's test negative and Neer's test positive. Stability Right: no laxity, sulcus sign negative, and anterior apprehension test negative. Strength Right: abduction 5/5, adduction 5/5, flexion 5/5, and extension 5/5. ° °Skin: Right Upper Extremity: normal. ° °Neurological System: Biceps Reflex Right: normal (2). Brachioradialis Reflex Right: normal (2). Triceps Reflex Right: normal (2). Sensation on the Right: C5 normal, C6 normal, and C7 normal.  °Skin: °   General: Skin is warm and dry.  °Neurological:  °   Mental Status: She is alert.  °  °MRI of the shoulder demonstrates a rotator cuff tear retracted. Mainly supraspinatus part of the infraspinatus. There is stage I atrophy. Mild AC arthrosis. Located glenohumeral joint. ° °Assessment/Plan °Impression: ° °Patient with persistent right shoulder pain secondary to retracted tear of the rotator cuff supraspinatus. Stage I atrophy. ° °Plan: ° °Discussed options include living with her symptoms physical therapy activity modification and occasional corticosteroid injection. Versus surgical intervention. She indicates at  this point her pain and dysfunction is intolerable. We discussed rotator cuff repair with possible patch graft. And also that if the tendon is not repairable I decompression and lavage. With the possibility of a reverse shoulder arthroplasty in the future. ° °An extensive discussion concerning the pathology relevant anatomy and treatment options. After that discussion we mutually agreed to proceed with repair of the rotator cuff utilizing arthroscopic assistance if possible. The risks and benefits of that procedure were discussed including bleeding, infection, suboptimal range of motion, deep venous thrombosis, pulmonary embolism, anesthetic complications etc. in addition we discussed the postoperative course to include approximately 4 weeks of passive range of motion followed by 4 weeks of active range of motion followed by 4-12 weeks of progressive strengthening exercises. In addition we discussed protective activities to reduce the risk of a reinjury including impingement activities with elbow above the shoulder as well as reaching and repetitive circular motion activities. The hospital stay will either be as a outpatient with a regional block versus overnight depending upon the extent of the procedure and any challenging health issues with a first postoperative visit 2 weeks following the surgery. ° °Plan right shoulder mini-open RCR, SAD, possible patch graft ° °Tyrika Newman M Marda Breidenbach, PA-C for Dr Beane °09/09/2021, 8:32 AM ° ° ° °

## 2021-09-09 NOTE — H&P (Signed)
Carrie Barnes is an 86 y.o. female.   Chief Complaint: right shoulder pain HPI: Visit For: Follow up (to discuss MRI) Hand Dominance: right Location: right; shoulder Duration: months Severity: pain level 4/10 Alleviating Factors: No medication for her shoulder Aggravating Factors: ROM; pain at night  Past Medical History:  Diagnosis Date   Cholecystitis    Coronary artery disease    a. 03/2017: 80-90% LAD stenosis (PCI/DES placement with a 2.75x16 mm Promus Premier stent). No significant stenosis along RCA or LCx.    DJD (degenerative joint disease)    Hyperlipidemia    Hypertension    dx s/p MI    Ischemic cardiomyopathy    Myocardial infarction [facility]) 03/17/2017   03-2017 treated at [facility]    Osteoporosis    Status post insertion of drug-eluting stent into left anterior descending (LAD) artery 03/2017    Past Surgical History:  Procedure Laterality Date   APPENDECTOMY     CESAREAN SECTION     CHOLECYSTECTOMY N/A 11/05/2017   Procedure: LAPAROSCOPIC CHOLECYSTECTOMY WITH INTRAOPERATIVE CHOLANGIOGRAM;  Surgeon: Armandina Gemma, MD;  Location: [facility];  Service: General;  Laterality: N/A;   ERCP N/A 11/11/2017   Procedure: ENDOSCOPIC RETROGRADE CHOLANGIOPANCREATOGRAPHY (ERCP);  Surgeon: Arta Silence, MD;  Location: [facility];  Service: Endoscopy;  Laterality: N/A;  possible   EUS N/A 11/11/2017   Procedure: UPPER ENDOSCOPIC ULTRASOUND (EUS) RADIAL;  Surgeon: Arta Silence, MD;  Location: [facility];  Service: Endoscopy;  Laterality: N/A;   FEMUR IM NAIL Right 07/13/2019   Procedure: INTRAMEDULLARY (IM) RETROGRADE FEMORAL NAILING;  Surgeon: Gaynelle Arabian, MD;  Location: [facility];  Service: Orthopedics;  Laterality: Right;  65min   stent  Left 03/2017   anterior descending ; drug eluting ; tx of MI at [facility]    TONSILLECTOMY     VAGINAL HYSTERECTOMY     vaginal sling      Family History  Problem Relation Age of Onset   Heart disease  Father    Breast cancer Sister    Pancreatic cancer Sister    Social History:  reports that she has quit smoking. Her smoking use included cigarettes. She has a 30.00 pack-year smoking history. She has never used smokeless tobacco. She reports current alcohol use. She reports that she does not use drugs.  Allergies: No Known Allergies  Current meds: acetaminophen 500 mg tablet Adult Low Dose Aspirin 81 mg tablet alendronate 70 mg tablet ascorbic acid (vitamin C) 500 mg tablet atorvastatin 80 mg tablet Calcium 600 + D(3) cetirizine 10 mg capsule cholecalciferol (vitamin D3) 50 mcg (2,000 unit) capsule Cranberry Concentrate estradioL 0.01% (0.1 mg/gram) vaginal cream irbesartan 300 mg tablet latanoprost 0.005 % eye drops LOSARTAN POTASSIUM 25 MG TABS MECLIZINE HCL 12.5 MG TABS methenamine hippurate 1 gram tablet METOPROLOL SUCCINATE ER 50 MG TB24 Myrbetriq 25 mg tablet,extended release nitroglycerin 0.4 mg sublingual tablet Omega 3 Fish OiL omeprazole 20 mg capsule,delayed release Probiotic timoloL maleate 0.5 % eye drops traZODone 50 mg tablet  Review of Systems  Constitutional: Negative.   HENT: Negative.    Eyes: Negative.   Respiratory: Negative.    Cardiovascular: Negative.   Gastrointestinal: Negative.   Endocrine: Negative.   Genitourinary: Negative.   Musculoskeletal:  Positive for arthralgias and myalgias.  Skin: Negative.   Neurological: Negative.    There were no vitals taken for this visit. Physical Exam Constitutional:      Appearance: Normal appearance.  HENT:     Head:  Normocephalic.     Right Ear: External ear normal.     Left Ear: External ear normal.     Nose: Nose normal.     Mouth/Throat:     Pharynx: Oropharynx is clear.  Eyes:     Conjunctiva/sclera: Conjunctivae normal.  Cardiovascular:     Rate and Rhythm: Normal rate and regular rhythm.     Pulses: Normal pulses.  Pulmonary:     Effort: Pulmonary effort is normal.  Abdominal:      General: Bowel sounds are normal.  Musculoskeletal:     Cervical back: Normal range of motion.     Comments: Constitutional: General Appearance: healthy-appearing and NAD.  Psychiatric: Mood and Affect: normal mood and affect.  Cardiovascular System: Arterial Pulses Right: radial normal and brachial normal. Varicosities Right: no varicosities.  C-Spine/Neck: Active Range of Motion: flexion normal, extension normal, and no pain elicited on motion.  Shoulders: Inspection Right: no misalignment, atrophy, erythema, swelling, or scapular winging. Bony Palpation Right: no tenderness of the sternoclavicular joint, the coracoid process, the acromioclavicular joint, the bicipital groove, or the scapula. Soft Tissue Palpation Right: tenderness of the supraspinatus and the subacromial bursa. Active Range of Motion Right: limited. Special Tests Right: Speed's test negative and Neer's test positive. Stability Right: no laxity, sulcus sign negative, and anterior apprehension test negative. Strength Right: abduction 5/5, adduction 5/5, flexion 5/5, and extension 5/5.  Skin: Right Upper Extremity: normal.  Neurological System: Biceps Reflex Right: normal (2). Brachioradialis Reflex Right: normal (2). Triceps Reflex Right: normal (2). Sensation on the Right: C5 normal, C6 normal, and C7 normal.  Skin:    General: Skin is warm and dry.  Neurological:     Mental Status: She is alert.    MRI of the shoulder demonstrates a rotator cuff tear retracted. Mainly supraspinatus part of the infraspinatus. There is stage I atrophy. Mild AC arthrosis. Located glenohumeral joint.  Assessment/Plan Impression:  Patient with persistent right shoulder pain secondary to retracted tear of the rotator cuff supraspinatus. Stage I atrophy.  Plan:  Discussed options include living with her symptoms physical therapy activity modification and occasional corticosteroid injection. Versus surgical intervention. She indicates at  this point her pain and dysfunction is intolerable. We discussed rotator cuff repair with possible patch graft. And also that if the tendon is not repairable I decompression and lavage. With the possibility of a reverse shoulder arthroplasty in the future.  An extensive discussion concerning the pathology relevant anatomy and treatment options. After that discussion we mutually agreed to proceed with repair of the rotator cuff utilizing arthroscopic assistance if possible. The risks and benefits of that procedure were discussed including bleeding, infection, suboptimal range of motion, deep venous thrombosis, pulmonary embolism, anesthetic complications etc. in addition we discussed the postoperative course to include approximately 4 weeks of passive range of motion followed by 4 weeks of active range of motion followed by 4-12 weeks of progressive strengthening exercises. In addition we discussed protective activities to reduce the risk of a reinjury including impingement activities with elbow above the shoulder as well as reaching and repetitive circular motion activities. The hospital stay will either be as a outpatient with a regional block versus overnight depending upon the extent of the procedure and any challenging health issues with a first postoperative visit 2 weeks following the surgery.  Plan right shoulder mini-open RCR, SAD, possible patch graft  Cecilie Kicks, PA-C for Dr Tonita Cong 09/09/2021, 8:32 AM

## 2021-09-12 NOTE — Progress Notes (Addendum)
COVID swab appointment: n/a  COVID Vaccine Completed: yes x5 Date COVID Vaccine completed: 08/20/19, 09/10/19 Has received booster: 04/2720, 11/26/20, 04/27/21 COVID vaccine manufacturer: Pf[employer]      Date of COVID positive in last 90 days: no  PCP - Renford Dills, MD Cardiologist - Verne Carrow, MD  Cardiac clearance 09/04/21 by Micah Flesher in Epic  Chest x-ray - 05/16/21 Epic EKG - 05/16/21 Epic Stress Test - n/a ECHO - 07/12/19 Epic Cardiac Cath - 2018 Pacemaker/ICD device last checked: n/a Spinal Cord Stimulator: n/a  Bowel Prep - no  Sleep Study - n/a CPAP -   Fasting Blood Sugar - n/a Checks Blood Sugar _____ times a day  Blood Thinner Instructions: Aspirin Instructions: ASA 81, hold 5 days Last Dose:  Activity level: Can go up a flight of stairs and perform activities of daily living without stopping and without symptoms of chest pain or shortness of breath.    Anesthesia review: cardiomyopathy, CAD, HTN, MI  Patient denies shortness of breath, fever, cough and chest pain at PAT appointment   Patient verbalized understanding of instructions that were given to them at the PAT appointment. Patient was also instructed that they will need to review over the PAT instructions again at home before surgery.

## 2021-09-12 NOTE — Patient Instructions (Addendum)
DUE TO COVID-19 ONLY ONE VISITOR IS ALLOWED TO COME WITH YOU AND STAY IN THE WAITING ROOM ONLY DURING PRE OP AND PROCEDURE.   **NO VISITORS ARE ALLOWED IN THE SHORT STAY AREA OR RECOVERY ROOM!!**       Your procedure is scheduled on: 09/20/21   Report to [facility] Main Entrance    Report to admitting at 10:15 AM   Call this number if you have problems the morning of surgery [phone]   Do not eat food :After Midnight.   May have liquids until 9:30 AM day of surgery  CLEAR LIQUID DIET  Foods Allowed                                                                     Foods Excluded  Water, Black Coffee and tea, regular and decaf                             liquids that you cannot  Plain Jell-O in any flavor  (No red)                                           see through such as: Fruit ices (not with fruit pulp)                                     milk, soups, orange juice              Iced Popsicles (No red)                                    All solid food                                   Apple juices Sports drinks like Gatorade (No red) Lightly seasoned clear broth or consume(fat free) Sugar     The day of surgery:  Drink ONE (1) Pre-Surgery Clear Ensure at 9:30 AM the morning of surgery. Drink in one sitting. Do not sip.  This drink was given to you during your hospital  pre-op appointment visit. Nothing else to drink after completing the  Pre-Surgery Clear Ensure.          If you have questions, please contact your surgeons office.  FOLLOW BOWEL PREP AND ANY ADDITIONAL PRE OP INSTRUCTIONS YOU RECEIVED FROM YOUR SURGEON'S OFFICE!!!     Oral Hygiene is also important to reduce your risk of infection.                                    Remember - BRUSH YOUR TEETH THE MORNING OF SURGERY WITH YOUR REGULAR TOOTHPASTE   Stop all vitamins, supplements, and Aspirin 5 days before surgery.   Take these medicines the morning of surgery with A SIP  OF WATER: Tylenol,  Zyrtec, Omeprazole                               You may not have any metal on your body including hair pins, jewelry, and body piercing             Do not wear make-up, lotions, powders, perfumes, or deodorant  Do not wear nail polish including gel and S&S, artificial/acrylic nails, or any other type of covering on natural nails including finger and toenails. If you have artificial nails, gel coating, etc. that needs to be removed by a nail salon please have this removed prior to surgery or surgery may need to be canceled/ delayed if the surgeon/ anesthesia feels like they are unable to be safely monitored.   Do not shave  48 hours prior to surgery.    Do not bring valuables to the hospital. [facility] IS NOT             RESPONSIBLE   FOR VALUABLES.   Contacts, dentures or bridgework may not be worn into surgery.    Patients discharged on the day of surgery will not be allowed to drive home.  Someone needs to stay with you for the first 24 hours after anesthesia.   Special Instructions: Bring a copy of your healthcare power of attorney and living will documents         the day of surgery if you haven't scanned them before.              Please read over the following fact sheets you were given: IF YOU HAVE QUESTIONS ABOUT YOUR PRE-OP INSTRUCTIONS PLEASE CALL [phone]- [facility] - Preparing for Surgery Before surgery, you can play an important role.  Because skin is not sterile, your skin needs to be as free of germs as possible.  You can reduce the number of germs on your skin by washing with CHG (chlorahexidine gluconate) soap before surgery.  CHG is an antiseptic cleaner which kills germs and bonds with the skin to continue killing germs even after washing. Please DO NOT use if you have an allergy to CHG or antibacterial soaps.  If your skin becomes reddened/irritated stop using the CHG and inform your nurse when you arrive at Short Stay. Do not shave (including legs and  underarms) for at least 48 hours prior to the first CHG shower.  You may shave your face/neck.  Please follow these instructions carefully:  1.  Shower with CHG Soap the night before surgery and the  morning of surgery.  2.  If you choose to wash your hair, wash your hair first as usual with your normal  shampoo.  3.  After you shampoo, rinse your hair and body thoroughly to remove the shampoo.                             4.  Use CHG as you would any other liquid soap.  You can apply chg directly to the skin and wash.  Gently with a scrungie or clean washcloth.  5.  Apply the CHG Soap to your body ONLY FROM THE NECK DOWN.   Do   not use on face/ open  Wound or open sores. Avoid contact with eyes, ears mouth and   genitals (private parts).                       Wash face,  Genitals (private parts) with your normal soap.             6.  Wash thoroughly, paying special attention to the area where your    surgery  will be performed.  7.  Thoroughly rinse your body with warm water from the neck down.  8.  DO NOT shower/wash with your normal soap after using and rinsing off the CHG Soap.                9.  Pat yourself dry with a clean towel.            10.  Wear clean pajamas.            11.  Place clean sheets on your bed the night of your first shower and do not  sleep with pets. Day of Surgery : Do not apply any lotions/deodorants the morning of surgery.  Please wear clean clothes to the hospital/surgery center.  FAILURE TO FOLLOW THESE INSTRUCTIONS MAY RESULT IN THE CANCELLATION OF YOUR SURGERY  PATIENT SIGNATURE_________________________________  NURSE SIGNATURE__________________________________  ________________________________________________________________________   Adam Phenix  An incentive spirometer is a tool that can help keep your lungs clear and active. This tool measures how well you are filling your lungs with each breath. Taking long deep  breaths may help reverse or decrease the chance of developing breathing (pulmonary) problems (especially infection) following: A long period of time when you are unable to move or be active. BEFORE THE PROCEDURE  If the spirometer includes an indicator to show your best effort, your nurse or respiratory therapist will set it to a desired goal. If possible, sit up straight or lean slightly forward. Try not to slouch. Hold the incentive spirometer in an upright position. INSTRUCTIONS FOR USE  Sit on the edge of your bed if possible, or sit up as far as you can in bed or on a chair. Hold the incentive spirometer in an upright position. Breathe out normally. Place the mouthpiece in your mouth and seal your lips tightly around it. Breathe in slowly and as deeply as possible, raising the piston or the ball toward the top of the column. Hold your breath for 3-5 seconds or for as long as possible. Allow the piston or ball to fall to the bottom of the column. Remove the mouthpiece from your mouth and breathe out normally. Rest for a few seconds and repeat Steps 1 through 7 at least 10 times every 1-2 hours when you are awake. Take your time and take a few normal breaths between deep breaths. The spirometer may include an indicator to show your best effort. Use the indicator as a goal to work toward during each repetition. After each set of 10 deep breaths, practice coughing to be sure your lungs are clear. If you have an incision (the cut made at the time of surgery), support your incision when coughing by placing a pillow or rolled up towels firmly against it. Once you are able to get out of bed, walk around indoors and cough well. You may stop using the incentive spirometer when instructed by your caregiver.  RISKS AND COMPLICATIONS Take your time so you do not get dizzy or light-headed. If you are in pain, you may  need to take or ask for pain medication before doing incentive spirometry. It is harder  to take a deep breath if you are having pain. AFTER USE Rest and breathe slowly and easily. It can be helpful to keep track of a log of your progress. Your caregiver can provide you with a simple table to help with this. If you are using the spirometer at home, follow these instructions: SEEK MEDICAL CARE IF:  You are having difficultly using the spirometer. You have trouble using the spirometer as often as instructed. Your pain medication is not giving enough relief while using the spirometer. You develop fever of 100.5 F (38.1 C) or higher. SEEK IMMEDIATE MEDICAL CARE IF:  You cough up bloody sputum that had not been present before. You develop fever of 102 F (38.9 C) or greater. You develop worsening pain at or near the incision site. MAKE SURE YOU:  Understand these instructions. Will watch your condition. Will get help right away if you are not doing well or get worse. Document Released: 12/08/2006 Document Revised: 10/20/2011 Document Reviewed: 02/08/2007 [facility] Patient Information 2014 [place], [place].   ________________________________________________________________________

## 2021-09-13 ENCOUNTER — Other Ambulatory Visit: Payer: Self-pay

## 2021-09-13 ENCOUNTER — Encounter (HOSPITAL_COMMUNITY)
Admission: RE | Admit: 2021-09-13 | Discharge: 2021-09-13 | Disposition: A | Discharge status: Home / Self Care | Payer: Medicare Other | Source: Ambulatory Visit | Attending: Specialist | Admitting: Specialist

## 2021-09-13 ENCOUNTER — Encounter (HOSPITAL_COMMUNITY): Payer: Self-pay

## 2021-09-13 DIAGNOSIS — X58XXXA Exposure to other specified factors, initial encounter: Secondary | ICD-10-CM | POA: Insufficient documentation

## 2021-09-13 DIAGNOSIS — I2510 Atherosclerotic heart disease of native coronary artery without angina pectoris: Secondary | ICD-10-CM | POA: Insufficient documentation

## 2021-09-13 DIAGNOSIS — I10 Essential (primary) hypertension: Secondary | ICD-10-CM | POA: Diagnosis not present

## 2021-09-13 DIAGNOSIS — Z955 Presence of coronary angioplasty implant and graft: Secondary | ICD-10-CM | POA: Diagnosis not present

## 2021-09-13 DIAGNOSIS — Z01812 Encounter for preprocedural laboratory examination: Secondary | ICD-10-CM | POA: Insufficient documentation

## 2021-09-13 DIAGNOSIS — S46011D Strain of muscle(s) and tendon(s) of the rotator cuff of right shoulder, subsequent encounter: Secondary | ICD-10-CM

## 2021-09-13 DIAGNOSIS — S46011A Strain of muscle(s) and tendon(s) of the rotator cuff of right shoulder, initial encounter: Secondary | ICD-10-CM | POA: Insufficient documentation

## 2021-09-13 DIAGNOSIS — I255 Ischemic cardiomyopathy: Secondary | ICD-10-CM | POA: Diagnosis not present

## 2021-09-13 DIAGNOSIS — Z87891 Personal history of nicotine dependence: Secondary | ICD-10-CM | POA: Diagnosis not present

## 2021-09-13 DIAGNOSIS — K219 Gastro-esophageal reflux disease without esophagitis: Secondary | ICD-10-CM | POA: Insufficient documentation

## 2021-09-13 HISTORY — DX: Gastro-esophageal reflux disease without esophagitis: K21.9

## 2021-09-13 LAB — BASIC METABOLIC PANEL
Anion gap: 8 (ref 5–15)
BUN: 14 mg/dL (ref 8–23)
CO2: 28 mmol/L (ref 22–32)
Calcium: 9.2 mg/dL (ref 8.9–10.3)
Chloride: 102 mmol/L (ref 98–111)
Creatinine, Ser: 0.87 mg/dL (ref 0.44–1.00)
GFR, Estimated: >60 mL/min (ref >60)
Glucose, Bld: 115 mg/dL — ABNORMAL HIGH (ref 70–99)
Potassium: 3.9 mmol/L (ref 3.5–5.1)
Sodium: 138 mmol/L (ref 135–145)

## 2021-09-13 LAB — CBC
HCT: 34.6 % — ABNORMAL LOW (ref 36.0–46.0)
Hemoglobin: 11.6 g/dL — ABNORMAL LOW (ref 12.0–15.0)
MCH: 30.9 pg (ref 26.0–34.0)
MCHC: 33.5 g/dL (ref 30.0–36.0)
MCV: 92 fL (ref 80.0–100.0)
Platelets: 173 10*3/uL (ref 150–400)
RBC: 3.76 MIL/uL — ABNORMAL LOW (ref 3.87–5.11)
RDW: 11.8 % (ref 11.5–15.5)
WBC: 5.8 10*3/uL (ref 4.0–10.5)
nRBC: 0 % (ref 0.0–0.2)

## 2021-09-16 NOTE — Progress Notes (Signed)
Anesthesia Chart Review   Case: [id number] Date/Time: 09/20/21 1215   Procedure: SHOULDER ARTHROSCOPY WITH MINI OPEN ROTATOR CUFF REPAIR AND SUBACROMIAL DECOMPRESSION WITH POSSIBLE PATCH GRAFT (Right) - 90 MINS   Anesthesia type: Choice   Pre-op diagnosis: Right rotator cuff tear   [facility] ROOM 07 / [facility]   Surgeons: Susa Day, MD       DISCUSSION:85 y.o. former smoker with h/o GERD, HTN, CAD (DES 2018), ischemic cardiomyopathy, right rotator cuff tear scheduled for above procedure 09/20/2021 with Dr. Susa Day.   Per cardiology preoperative evaluation 09/04/2021, "Chart reviewed as part of pre-operative protocol coverage. Pt has a history of ischemic cardiomyopathy with last PCI in 2018. Given her CAD, we would prefer to continue ASA throughout the perioperative period. If doing so would significantly increase morbidity or mortality, may hold for 5 days. "  Anticipate pt can proceed with planned procedure barring acute status change.   VS: BP (!) 140/52    Pulse 73    Temp 36.9 C (Oral)    Resp 16    Ht _0  (1.575 m)    Wt 70.1 kg    SpO2 97%    BMI 28.28 kg/m   PROVIDERS: Seward Carol, MD is PCP   Lauree [facility], MD is Cardiologist  LABS: Labs reviewed: Acceptable for surgery. (all labs ordered are listed, but only abnormal results are displayed)  Labs Reviewed  CBC - Abnormal; Notable for the following components:      Result Value   RBC 3.76 (*)    Hemoglobin 11.6 (*)    HCT 34.6 (*)    All other components within normal limits  BASIC METABOLIC PANEL - Abnormal; Notable for the following components:   Glucose, Bld 115 (*)    All other components within normal limits     IMAGES:   EKG:  05/13/2021 Rate 76 bpm  Sinus rhythm  No significant change since last tracing  CV: Echo 07/12/2019 1. Left ventricular ejection fraction, by visual estimation, is 60 to  65%. The left ventricle has normal function. There is no left ventricular   hypertrophy.   2. Global right ventricle has normal systolic function.The right  ventricular size is mildly enlarged. No increase in right ventricular wall  thickness.   3. Left atrial size was normal.   4. Right atrial size was normal.   5. Mild mitral annular calcification.   6. The mitral valve is normal in structure. Trace mitral valve  regurgitation. No evidence of mitral stenosis.   7. The tricuspid valve is normal in structure. Tricuspid valve  regurgitation is mild.   8. The aortic valve is tricuspid. Aortic valve regurgitation is not  visualized. No evidence of aortic valve sclerosis or stenosis.   9. The pulmonic valve was grossly normal. Pulmonic valve regurgitation is  trivial.  10. Moderately elevated pulmonary artery systolic pressure.  11. The inferior vena cava is normal in size with greater than 50%  respiratory variability, suggesting right atrial pressure of 3 mmHg.  Past Medical History:  Diagnosis Date   Cholecystitis    Coronary artery disease    a. 03/2017: 80-90% LAD stenosis (PCI/DES placement with a 2.75x16 mm Promus Premier stent). No significant stenosis along RCA or LCx.    DJD (degenerative joint disease)    GERD (gastroesophageal reflux disease)    Hyperlipidemia    Hypertension    dx s/p MI    Ischemic cardiomyopathy    Myocardial infarction ([place]) 03/17/2017  03-2017 treated at [facility]    Osteoporosis    Status post insertion of drug-eluting stent into left anterior descending (LAD) artery 03/2017    Past Surgical History:  Procedure Laterality Date   APPENDECTOMY     CESAREAN SECTION     CHOLECYSTECTOMY N/A 11/05/2017   Procedure: LAPAROSCOPIC CHOLECYSTECTOMY WITH INTRAOPERATIVE CHOLANGIOGRAM;  Surgeon: Armandina Gemma, MD;  Location: [facility];  Service: General;  Laterality: N/A;   ERCP N/A 11/11/2017   Procedure: ENDOSCOPIC RETROGRADE CHOLANGIOPANCREATOGRAPHY (ERCP);  Surgeon: Arta Silence, MD;  Location: [facility];   Service: Endoscopy;  Laterality: N/A;  possible   EUS N/A 11/11/2017   Procedure: UPPER ENDOSCOPIC ULTRASOUND (EUS) RADIAL;  Surgeon: Arta Silence, MD;  Location: [facility];  Service: Endoscopy;  Laterality: N/A;   FEMUR IM NAIL Right 07/13/2019   Procedure: INTRAMEDULLARY (IM) RETROGRADE FEMORAL NAILING;  Surgeon: Gaynelle Arabian, MD;  Location: [facility];  Service: Orthopedics;  Laterality: Right;  78mn   LUMBAR LAMINECTOMY/ DECOMPRESSION WITH MET-RX     stent  Left 03/2017   anterior descending ; drug eluting ; tx of MI at [facility]    [place]    vaginal sling      MEDICATIONS:  acetaminophen (TYLENOL) 500 MG tablet   alendronate (FOSAMAX) 70 MG tablet   aspirin EC 81 MG tablet   atorvastatin (LIPITOR) 80 MG tablet   Calcium Carb-Cholecalciferol (CALCIUM 600/VITAMIN D PO)   cetirizine (ZYRTEC) 10 MG tablet   Cholecalciferol (VITAMIN D3) 2000 units capsule   CRANBERRY EXTRACT PO   estradiol (ESTRACE) 0.1 MG/GM vaginal cream   hydrochlorothiazide (HYDRODIURIL) 25 MG tablet   irbesartan (AVAPRO) 300 MG tablet   latanoprost (XALATAN) 0.005 % ophthalmic solution   methenamine (HIPREX) 1 g tablet   metoprolol succinate (TOPROL-XL) 50 MG 24 hr tablet   mirabegron ER (MYRBETRIQ) 25 MG TB24 tablet   Multiple Vitamin (MULTI-VITAMINS) TABS   nitroGLYCERIN (NITROSTAT) 0.4 MG SL tablet   Omega-3 Fatty Acids (FISH OIL) 1200 MG CAPS   omeprazole (PRILOSEC) 20 MG capsule   Polyethyl Glycol-Propyl Glycol (SYSTANE OP)   Probiotic Product (TRUNATURE DIGESTIVE PROBIOTIC) CAPS   timolol (TIMOPTIC) 0.5 % ophthalmic solution   traZODone (DESYREL) 50 MG tablet   vitamin C (ASCORBIC ACID) 500 MG tablet   No current facility-administered medications for this encounter.    indomethacin (INDOCIN) 50 MG suppository 50 mg     JThe Vines HospitalWard, PA-C [facility] Testing ([phone]

## 2021-09-16 NOTE — Anesthesia Preprocedure Evaluation (Addendum)
Anesthesia Evaluation  Patient identified by MRN, date of birth, ID band Patient awake    Reviewed: Allergy & Precautions, NPO status , Patient's Chart, lab work & pertinent test results  Airway Mallampati: I  TM Distance: >3 FB Neck ROM: Full    Dental no notable dental hx. (+) Edentulous Upper, Edentulous Lower   Pulmonary neg pulmonary ROS, former smoker,    Pulmonary exam normal breath sounds clear to auscultation       Cardiovascular hypertension, Pt. on medications and Pt. on home beta blockers + CAD, + Past MI and + Cardiac Stents  Normal cardiovascular exam Rhythm:Regular Rate:Normal  EKG: 05/13/2021 Rate 76 bpm  Sinus rhythm  No significant change since last tracing  CV: Echo 07/12/2019 1. Left ventricular ejection fraction, by visual estimation, is 60 to  65%. The left ventricle has normal function. There is no left ventricular  hypertrophy.  2. Global right ventricle has normal systolic function.The right  ventricular size is mildly enlarged. No increase in right ventricular wall  thickness.   5. Mild mitral annular calcification.  6. The mitral valve is normal in structure. Trace mitral valve  regurgitation. No evidence of mitral stenosis.  8. The aortic valve is tricuspid. Aortic valve regurgitation is not  visualized. No evidence of aortic valve sclerosis or stenosis.  10. Moderately elevated pulmonary artery systolic pressure.   03/2017: 80-90% LAD stenosis (PCI/DES placement with a 2.75x16 mm Promus Premier stent). No significant stenosis along RCA or LCx.    Neuro/Psych negative neurological ROS  negative psych ROS   GI/Hepatic Neg liver ROS, GERD  ,  Endo/Other  negative endocrine ROS  Renal/GU negative Renal ROS  negative genitourinary   Musculoskeletal  (+) Arthritis , Osteoarthritis,    Abdominal   Peds negative pediatric ROS (+)  Hematology negative hematology ROS (+)    Anesthesia Other Findings   Reproductive/Obstetrics negative OB ROS                            Anesthesia Physical Anesthesia Plan  ASA: 3  Anesthesia Plan: General   Post-op Pain Management:    Induction:   PONV Risk Score and Plan: 3 and Ondansetron, Dexamethasone, Midazolam and Treatment may vary due to age or medical condition  Airway Management Planned: Oral ETT and LMA  Additional Equipment:   Intra-op Plan:   Post-operative Plan: Extubation in OR  Informed Consent: I have reviewed the patients History and Physical, chart, labs and discussed the procedure including the risks, benefits and alternatives for the proposed anesthesia with the patient or authorized representative who has indicated his/her understanding and acceptance.     Dental advisory given  Plan Discussed with:   Anesthesia Plan Comments: (See PAT note 09/13/2021, Jodell Cipro Ward, PA-C 86 y.o. former smoker with h/o GERD, HTN, CAD (DES 2018), ischemic cardiomyopathy, right rotator cuff tear scheduled for above procedure 09/20/2021 with Dr. Jene Every.  Per cardiology preoperative evaluation 09/04/2021, "Chart reviewed as part of pre-operative protocol coverage.Pt has a history of ischemic cardiomyopathy with last PCI in 2018. Given her CAD, we would prefer to continue ASA throughout the perioperative period. If doing so would significantly increase morbidity or mortality, may hold for 5 days.")       Anesthesia Quick Evaluation

## 2021-09-20 ENCOUNTER — Encounter (HOSPITAL_COMMUNITY): Payer: Self-pay | Admitting: Specialist

## 2021-09-20 ENCOUNTER — Other Ambulatory Visit: Payer: Self-pay

## 2021-09-20 ENCOUNTER — Observation Stay (HOSPITAL_COMMUNITY)
Admission: RE | Admit: 2021-09-20 | Discharge: 2021-09-21 | Disposition: A | Discharge status: Home Health | Payer: Medicare Other | Source: Ambulatory Visit | Attending: Specialist | Admitting: Specialist

## 2021-09-20 ENCOUNTER — Ambulatory Visit (HOSPITAL_COMMUNITY): Payer: Medicare Other | Admitting: Physician Assistant

## 2021-09-20 ENCOUNTER — Ambulatory Visit (HOSPITAL_BASED_OUTPATIENT_CLINIC_OR_DEPARTMENT_OTHER): Payer: Medicare Other | Admitting: Anesthesiology

## 2021-09-20 ENCOUNTER — Encounter (HOSPITAL_COMMUNITY)
Admission: RE | Disposition: A | Discharge status: Home / Self Care | Payer: Self-pay | Source: Ambulatory Visit | Attending: Specialist

## 2021-09-20 DIAGNOSIS — I2510 Atherosclerotic heart disease of native coronary artery without angina pectoris: Secondary | ICD-10-CM | POA: Diagnosis not present

## 2021-09-20 DIAGNOSIS — Z79899 Other long term (current) drug therapy: Secondary | ICD-10-CM | POA: Insufficient documentation

## 2021-09-20 DIAGNOSIS — I252 Old myocardial infarction: Secondary | ICD-10-CM

## 2021-09-20 DIAGNOSIS — Z87891 Personal history of nicotine dependence: Secondary | ICD-10-CM | POA: Diagnosis not present

## 2021-09-20 DIAGNOSIS — Z7982 Long term (current) use of aspirin: Secondary | ICD-10-CM | POA: Diagnosis not present

## 2021-09-20 DIAGNOSIS — I10 Essential (primary) hypertension: Secondary | ICD-10-CM | POA: Insufficient documentation

## 2021-09-20 DIAGNOSIS — M75121 Complete rotator cuff tear or rupture of right shoulder, not specified as traumatic: Secondary | ICD-10-CM | POA: Diagnosis not present

## 2021-09-20 DIAGNOSIS — M75120 Complete rotator cuff tear or rupture of unspecified shoulder, not specified as traumatic: Secondary | ICD-10-CM

## 2021-09-20 DIAGNOSIS — M75101 Unspecified rotator cuff tear or rupture of right shoulder, not specified as traumatic: Secondary | ICD-10-CM

## 2021-09-20 HISTORY — PX: SHOULDER ARTHROSCOPY WITH ROTATOR CUFF REPAIR AND SUBACROMIAL DECOMPRESSION: SHX5686

## 2021-09-20 SURGERY — SHOULDER ARTHROSCOPY WITH ROTATOR CUFF REPAIR AND SUBACROMIAL DECOMPRESSION
Anesthesia: General | Laterality: Right

## 2021-09-20 MED ORDER — ONDANSETRON HCL 4 MG/2ML IJ SOLN
INTRAMUSCULAR | Status: DC | PRN
  Administered 2021-09-20: 4 mg via INTRAVENOUS

## 2021-09-20 MED ORDER — POLYETHYL GLYCOL-PROPYL GLYCOL 0.4-0.3 % OP GEL
Freq: Every day | OPHTHALMIC | Status: DC | PRN
  Filled 2021-09-20: qty 10

## 2021-09-20 MED ORDER — METOCLOPRAMIDE HCL 5 MG/ML IJ SOLN: 5.0000 mg | Freq: Three times a day (TID) | INTRAMUSCULAR | Status: DC | PRN

## 2021-09-20 MED ORDER — HYDROCODONE-ACETAMINOPHEN 5-325 MG PO TABS: 1.0000 | ORAL_TABLET | ORAL | Status: DC | PRN

## 2021-09-20 MED ORDER — PANTOPRAZOLE SODIUM 40 MG PO TBEC
40.0000 mg | DELAYED_RELEASE_TABLET | Freq: Every day | ORAL | Status: DC
  Administered 2021-09-21: 40 mg via ORAL
  Filled 2021-09-20: qty 1

## 2021-09-20 MED ORDER — DOCUSATE SODIUM 100 MG PO CAPS
100.0000 mg | ORAL_CAPSULE | Freq: Two times a day (BID) | ORAL | Status: DC
  Administered 2021-09-20 – 2021-09-21 (×2): 100 mg via ORAL
  Filled 2021-09-20 (×2): qty 1

## 2021-09-20 MED ORDER — PHENYLEPHRINE HCL (PRESSORS) 10 MG/ML IV SOLN
INTRAVENOUS | Status: DC | PRN
  Administered 2021-09-20 (×2): 40 ug via INTRAVENOUS

## 2021-09-20 MED ORDER — VITAMIN D3 25 MCG (1000 UNIT) PO TABS
2000.0000 [IU] | ORAL_TABLET | Freq: Every day | ORAL | Status: DC
  Administered 2021-09-21: 2000 [IU] via ORAL
  Filled 2021-09-20 (×2): qty 2

## 2021-09-20 MED ORDER — OXYCODONE HCL 5 MG/5ML PO SOLN: 5.0000 mg | Freq: Once | ORAL | Status: DC | PRN

## 2021-09-20 MED ORDER — ALUM & MAG HYDROXIDE-SIMETH 200-200-20 MG/5ML PO SUSP
30.0000 mL | ORAL | Status: DC | PRN
Start: 2021-09-20 — End: 2021-09-21

## 2021-09-20 MED ORDER — PROPOFOL 10 MG/ML IV BOLUS
INTRAVENOUS | Status: DC | PRN
  Administered 2021-09-20: 100 mg via INTRAVENOUS

## 2021-09-20 MED ORDER — LIDOCAINE HCL (CARDIAC) PF 100 MG/5ML IV SOSY
PREFILLED_SYRINGE | INTRAVENOUS | Status: DC | PRN
  Administered 2021-09-20: 100 mg via INTRAVENOUS

## 2021-09-20 MED ORDER — METHENAMINE MANDELATE 0.5 G PO TABS
1.0000 g | ORAL_TABLET | Freq: Two times a day (BID) | ORAL | Status: DC
  Filled 2021-09-20: qty 2

## 2021-09-20 MED ORDER — CHLORHEXIDINE GLUCONATE 0.12 % MT SOLN
15.0000 mL | Freq: Once | OROMUCOSAL | Status: AC
  Administered 2021-09-20: 15 mL via OROMUCOSAL

## 2021-09-20 MED ORDER — MIDAZOLAM HCL 2 MG/2ML IJ SOLN
1.0000 mg | INTRAMUSCULAR | Status: DC
  Administered 2021-09-20: 1 mg via INTRAVENOUS
  Filled 2021-09-20: qty 2

## 2021-09-20 MED ORDER — PHENYLEPHRINE HCL-NACL 20-0.9 MG/250ML-% IV SOLN
INTRAVENOUS | Status: AC
  Filled 2021-09-20: qty 250

## 2021-09-20 MED ORDER — PHENOL 1.4 % MT LIQD: 1.0000 | OROMUCOSAL | Status: DC | PRN

## 2021-09-20 MED ORDER — KCL IN DEXTROSE-NACL 20-5-0.45 MEQ/L-%-% IV SOLN
INTRAVENOUS | Status: DC
  Filled 2021-09-20: qty 1000

## 2021-09-20 MED ORDER — ASPIRIN EC 81 MG PO TBEC
81.0000 mg | DELAYED_RELEASE_TABLET | Freq: Every day | ORAL | Status: DC
  Administered 2021-09-21: 81 mg via ORAL
  Filled 2021-09-20: qty 1

## 2021-09-20 MED ORDER — ONDANSETRON HCL 4 MG/2ML IJ SOLN
INTRAMUSCULAR | Status: AC
  Filled 2021-09-20: qty 2

## 2021-09-20 MED ORDER — FENTANYL CITRATE (PF) 100 MCG/2ML IJ SOLN
INTRAMUSCULAR | Status: DC | PRN
Start: 2021-09-20 — End: 2021-09-20
  Administered 2021-09-20: 25 ug via INTRAVENOUS

## 2021-09-20 MED ORDER — DIPHENHYDRAMINE HCL 12.5 MG/5ML PO ELIX: 12.5000 mg | ORAL_SOLUTION | ORAL | Status: DC | PRN

## 2021-09-20 MED ORDER — FENTANYL CITRATE PF 50 MCG/ML IJ SOSY
50.0000 ug | PREFILLED_SYRINGE | INTRAMUSCULAR | Status: DC
  Administered 2021-09-20: 50 ug via INTRAVENOUS
  Filled 2021-09-20: qty 2

## 2021-09-20 MED ORDER — ALENDRONATE SODIUM 70 MG PO TABS: 70.0000 mg | ORAL_TABLET | ORAL | Status: DC

## 2021-09-20 MED ORDER — ACETAMINOPHEN 500 MG PO TABS
500.0000 mg | ORAL_TABLET | Freq: Four times a day (QID) | ORAL | Status: AC
  Administered 2021-09-20 – 2021-09-21 (×4): 500 mg via ORAL
  Filled 2021-09-20 (×4): qty 1

## 2021-09-20 MED ORDER — FENTANYL CITRATE (PF) 100 MCG/2ML IJ SOLN
INTRAMUSCULAR | Status: AC
  Filled 2021-09-20: qty 2

## 2021-09-20 MED ORDER — VASOPRESSIN 20 UNIT/ML IV SOLN
INTRAVENOUS | Status: DC | PRN
Start: 2021-09-20 — End: 2021-09-20
  Administered 2021-09-20 (×2): 1 [IU] via INTRAVENOUS
  Administered 2021-09-20: 2 [IU] via INTRAVENOUS

## 2021-09-20 MED ORDER — ONDANSETRON HCL 4 MG/2ML IJ SOLN: 4.0000 mg | Freq: Four times a day (QID) | INTRAMUSCULAR | Status: DC | PRN

## 2021-09-20 MED ORDER — DEXAMETHASONE SODIUM PHOSPHATE 10 MG/ML IJ SOLN
INTRAMUSCULAR | Status: DC | PRN
  Administered 2021-09-20: 8 mg via INTRAVENOUS

## 2021-09-20 MED ORDER — ORAL CARE MOUTH RINSE: 15.0000 mL | Freq: Once | OROMUCOSAL | Status: AC

## 2021-09-20 MED ORDER — LIDOCAINE HCL (PF) 2 % IJ SOLN
INTRAMUSCULAR | Status: AC
  Filled 2021-09-20: qty 5

## 2021-09-20 MED ORDER — TRAZODONE HCL 50 MG PO TABS: 50.0000 mg | ORAL_TABLET | Freq: Every evening | ORAL | Status: DC | PRN

## 2021-09-20 MED ORDER — METOPROLOL SUCCINATE ER 50 MG PO TB24
50.0000 mg | ORAL_TABLET | Freq: Every evening | ORAL | Status: DC
  Administered 2021-09-20: 50 mg via ORAL
  Filled 2021-09-20: qty 1

## 2021-09-20 MED ORDER — ADULT MULTIVITAMIN W/MINERALS CH
1.0000 | ORAL_TABLET | Freq: Every day | ORAL | Status: DC
  Administered 2021-09-21: 1 via ORAL
  Filled 2021-09-20: qty 1

## 2021-09-20 MED ORDER — HYDROCHLOROTHIAZIDE 25 MG PO TABS: 25.0000 mg | ORAL_TABLET | Freq: Every day | ORAL | Status: DC | PRN

## 2021-09-20 MED ORDER — ONDANSETRON HCL 4 MG PO TABS: 4.0000 mg | ORAL_TABLET | Freq: Four times a day (QID) | ORAL | Status: DC | PRN

## 2021-09-20 MED ORDER — ACETAMINOPHEN 500 MG PO TABS: 500.0000 mg | ORAL_TABLET | Freq: Three times a day (TID) | ORAL | Status: DC | PRN

## 2021-09-20 MED ORDER — LORATADINE 10 MG PO TABS
10.0000 mg | ORAL_TABLET | Freq: Every day | ORAL | Status: DC
  Administered 2021-09-21: 10 mg via ORAL
  Filled 2021-09-20: qty 1

## 2021-09-20 MED ORDER — OXYCODONE HCL 5 MG PO TABS: 5.0000 mg | ORAL_TABLET | Freq: Once | ORAL | Status: DC | PRN

## 2021-09-20 MED ORDER — ROCURONIUM BROMIDE 100 MG/10ML IV SOLN
INTRAVENOUS | Status: DC | PRN
  Administered 2021-09-20: 50 mg via INTRAVENOUS

## 2021-09-20 MED ORDER — POLYETHYLENE GLYCOL 3350 17 G PO PACK
17.0000 g | PACK | Freq: Every day | ORAL | Status: DC | PRN
Start: 2021-09-20 — End: 2021-09-21

## 2021-09-20 MED ORDER — GLYCOPYRROLATE 0.2 MG/ML IJ SOLN
INTRAMUSCULAR | Status: AC
  Filled 2021-09-20: qty 1

## 2021-09-20 MED ORDER — HYDROCODONE-ACETAMINOPHEN 5-325 MG PO TABS: 1.0000 | ORAL_TABLET | ORAL | 0 refills | Status: DC | PRN

## 2021-09-20 MED ORDER — ACETAMINOPHEN 160 MG/5ML PO SOLN: 325.0000 mg | ORAL | Status: DC | PRN

## 2021-09-20 MED ORDER — MIRABEGRON ER 25 MG PO TB24
25.0000 mg | ORAL_TABLET | Freq: Every day | ORAL | Status: DC
  Administered 2021-09-20: 25 mg via ORAL
  Filled 2021-09-20 (×2): qty 1

## 2021-09-20 MED ORDER — ONDANSETRON HCL 4 MG/2ML IJ SOLN: 4.0000 mg | Freq: Once | INTRAMUSCULAR | Status: DC | PRN

## 2021-09-20 MED ORDER — PROPOFOL 10 MG/ML IV BOLUS
INTRAVENOUS | Status: AC
  Filled 2021-09-20: qty 20

## 2021-09-20 MED ORDER — MEPERIDINE HCL 50 MG/ML IJ SOLN: 6.2500 mg | INTRAMUSCULAR | Status: DC | PRN

## 2021-09-20 MED ORDER — IRBESARTAN 150 MG PO TABS
300.0000 mg | ORAL_TABLET | Freq: Every day | ORAL | Status: DC
  Administered 2021-09-21: 300 mg via ORAL
  Filled 2021-09-20 (×2): qty 2

## 2021-09-20 MED ORDER — CEFAZOLIN SODIUM-DEXTROSE 2-4 GM/100ML-% IV SOLN
2.0000 g | INTRAVENOUS | Status: AC
  Administered 2021-09-20: 2 g via INTRAVENOUS
  Filled 2021-09-20: qty 100

## 2021-09-20 MED ORDER — METOCLOPRAMIDE HCL 5 MG PO TABS: 5.0000 mg | ORAL_TABLET | Freq: Three times a day (TID) | ORAL | Status: DC | PRN

## 2021-09-20 MED ORDER — BUPIVACAINE-EPINEPHRINE (PF) 0.25% -1:200000 IJ SOLN
INTRAMUSCULAR | Status: AC
  Filled 2021-09-20: qty 30

## 2021-09-20 MED ORDER — ACETAMINOPHEN 325 MG PO TABS: 325.0000 mg | ORAL_TABLET | ORAL | Status: DC | PRN

## 2021-09-20 MED ORDER — TIMOLOL MALEATE 0.5 % OP SOLN
1.0000 [drp] | Freq: Two times a day (BID) | OPHTHALMIC | Status: DC
  Administered 2021-09-20: 1 [drp] via OPHTHALMIC
  Filled 2021-09-20: qty 5

## 2021-09-20 MED ORDER — BUPIVACAINE LIPOSOME 1.3 % IJ SUSP
INTRAMUSCULAR | Status: DC | PRN
  Administered 2021-09-20: 10 mL via PERINEURAL

## 2021-09-20 MED ORDER — CEFAZOLIN SODIUM-DEXTROSE 1-4 GM/50ML-% IV SOLN
1.0000 g | Freq: Four times a day (QID) | INTRAVENOUS | Status: AC
  Administered 2021-09-20 – 2021-09-21 (×2): 1 g via INTRAVENOUS
  Filled 2021-09-20 (×2): qty 50

## 2021-09-20 MED ORDER — PHENYLEPHRINE HCL-NACL 20-0.9 MG/250ML-% IV SOLN
INTRAVENOUS | Status: DC | PRN
  Administered 2021-09-20: 25 ug/min via INTRAVENOUS

## 2021-09-20 MED ORDER — ACETAMINOPHEN 325 MG PO TABS: 325.0000 mg | ORAL_TABLET | Freq: Four times a day (QID) | ORAL | Status: DC | PRN

## 2021-09-20 MED ORDER — BISACODYL 5 MG PO TBEC: 5.0000 mg | DELAYED_RELEASE_TABLET | Freq: Every day | ORAL | Status: DC | PRN

## 2021-09-20 MED ORDER — RISAQUAD PO CAPS: 1.0000 | ORAL_CAPSULE | Freq: Every day | ORAL | Status: DC

## 2021-09-20 MED ORDER — BUPIVACAINE-EPINEPHRINE 0.5% -1:200000 IJ SOLN
INTRAMUSCULAR | Status: DC | PRN
  Administered 2021-09-20: 15 mL

## 2021-09-20 MED ORDER — EPINEPHRINE PF 1 MG/ML IJ SOLN
INTRAMUSCULAR | Status: AC
  Filled 2021-09-20: qty 2

## 2021-09-20 MED ORDER — MENTHOL 3 MG MT LOZG: 1.0000 | LOZENGE | OROMUCOSAL | Status: DC | PRN

## 2021-09-20 MED ORDER — LACTATED RINGERS IV SOLN: INTRAVENOUS | Status: DC

## 2021-09-20 MED ORDER — ASCORBIC ACID 500 MG PO TABS
500.0000 mg | ORAL_TABLET | Freq: Every day | ORAL | Status: DC
  Administered 2021-09-21: 500 mg via ORAL
  Filled 2021-09-20: qty 1

## 2021-09-20 MED ORDER — DOCUSATE SODIUM 100 MG PO CAPS: 100.0000 mg | ORAL_CAPSULE | Freq: Two times a day (BID) | ORAL | 1 refills | Status: DC | PRN

## 2021-09-20 MED ORDER — POLYETHYLENE GLYCOL 3350 17 G PO PACK: 17.0000 g | PACK | Freq: Every day | ORAL | 0 refills | Status: DC

## 2021-09-20 MED ORDER — HYDROCODONE-ACETAMINOPHEN 7.5-325 MG PO TABS: 1.0000 | ORAL_TABLET | ORAL | Status: DC | PRN

## 2021-09-20 MED ORDER — GLYCOPYRROLATE 0.2 MG/ML IJ SOLN
INTRAMUSCULAR | Status: DC | PRN
  Administered 2021-09-20: .1 mg via INTRAVENOUS

## 2021-09-20 MED ORDER — RISAQUAD PO CAPS
1.0000 | ORAL_CAPSULE | Freq: Every day | ORAL | Status: DC
  Administered 2021-09-21: 1 via ORAL
  Filled 2021-09-20: qty 1

## 2021-09-20 MED ORDER — FENTANYL CITRATE PF 50 MCG/ML IJ SOSY: 25.0000 ug | PREFILLED_SYRINGE | INTRAMUSCULAR | Status: DC | PRN

## 2021-09-20 MED ORDER — LATANOPROST 0.005 % OP SOLN
1.0000 [drp] | Freq: Every day | OPHTHALMIC | Status: DC
  Administered 2021-09-20: 1 [drp] via OPHTHALMIC
  Filled 2021-09-20: qty 2.5

## 2021-09-20 MED ORDER — NITROGLYCERIN 0.4 MG SL SUBL: 0.4000 mg | SUBLINGUAL_TABLET | SUBLINGUAL | Status: DC | PRN

## 2021-09-20 MED ORDER — BUPIVACAINE-EPINEPHRINE (PF) 0.5% -1:200000 IJ SOLN
INTRAMUSCULAR | Status: DC | PRN
Start: 2021-09-20 — End: 2021-09-20
  Administered 2021-09-20: 15 mL via PERINEURAL

## 2021-09-20 SURGICAL SUPPLY — 65 items
AID PSTN UNV HD RSTRNT DISP (MISCELLANEOUS)
ANCH SUT 2 SWLK 19.1 CLS EYLT (Anchor) ×2 IMPLANT
ANCH SUT TGRTAPE 1.3X2.6X1.7 (Anchor) ×1 IMPLANT
ANCHOR NDL 9/16 CIR SZ 8 (NEEDLE) IMPLANT
ANCHOR NEEDLE 9/16 CIR SZ 8 (NEEDLE) IMPLANT
ANCHOR SUT FBRTK 2.6 SP1.7 TAP (Anchor) ×1 IMPLANT
ANCHOR SWIVELOCK BIO 4.75X19.1 (Anchor) ×2 IMPLANT
BAG COUNTER SPONGE SURGICOUNT (BAG) IMPLANT
BAG SPNG CNTER NS LX DISP (BAG)
BLADE OSCILLATING/SAGITTAL (BLADE) ×2
BLADE SURG SZ11 CARB STEEL (BLADE) ×2 IMPLANT
BLADE SW THK.38XMED LNG THN (BLADE) ×1 IMPLANT
CANNULA ACUFO 5X76 (CANNULA) ×2 IMPLANT
CANNULA SHOULDER 7CM (CANNULA) IMPLANT
CLSR STERI-STRIP ANTIMIC 1/2X4 (GAUZE/BANDAGES/DRESSINGS) ×1 IMPLANT
COVER SURGICAL LIGHT HANDLE (MISCELLANEOUS) ×2 IMPLANT
DRAPE STERI 35X30 U-POUCH (DRAPES) ×2 IMPLANT
DRSG AQUACEL AG 3.5X4 (GAUZE/BANDAGES/DRESSINGS) ×1 IMPLANT
DRSG AQUACEL AG ADV 3.5X 4 (GAUZE/BANDAGES/DRESSINGS) ×1 IMPLANT
DRSG AQUACEL AG ADV 3.5X 6 (GAUZE/BANDAGES/DRESSINGS) IMPLANT
DRSG PAD ABDOMINAL 8X10 ST (GAUZE/BANDAGES/DRESSINGS) IMPLANT
DURAPREP 26ML APPLICATOR (WOUND CARE) ×2 IMPLANT
ELECT NDL TIP 2.8 STRL (NEEDLE) IMPLANT
ELECT NEEDLE TIP 2.8 STRL (NEEDLE) IMPLANT
ELECT REM PT RETURN 15FT ADLT (MISCELLANEOUS) ×2 IMPLANT
GLOVE SURG POLYISO LF SZ7.5 (GLOVE) ×4 IMPLANT
GLOVE SURG POLYISO LF SZ8 (GLOVE) ×4 IMPLANT
GLOVE SURG UNDER POLY LF SZ7.5 (GLOVE) ×2 IMPLANT
GOWN STRL REUS W/TWL XL LVL3 (GOWN DISPOSABLE) ×4 IMPLANT
GRAFT TISS 20X25 1 THK DERM (Tissue) IMPLANT
KIT BASIN OR (CUSTOM PROCEDURE TRAY) IMPLANT
KIT TURNOVER KIT A (KITS) IMPLANT
NDL SCORPION MULTI FIRE (NEEDLE) IMPLANT
NDL SPNL 18GX3.5 QUINCKE PK (NEEDLE) ×1 IMPLANT
NEEDLE SCORPION MULTI FIRE (NEEDLE) ×2 IMPLANT
NEEDLE SPNL 18GX3.5 QUINCKE PK (NEEDLE) ×2 IMPLANT
PACK SHOULDER (CUSTOM PROCEDURE TRAY) ×2 IMPLANT
PORT APPOLLO RF 90DEGREE MULTI (SURGICAL WAND) IMPLANT
RESTRAINT HEAD UNIVERSAL NS (MISCELLANEOUS) IMPLANT
SLING ARM FOAM STRAP LRG (SOFTGOODS) IMPLANT
SLING ARM IMMOBILIZER LRG (SOFTGOODS) ×1 IMPLANT
SLING ULTRA II L (ORTHOPEDIC SUPPLIES) IMPLANT
SPONGE SURGIFOAM ABS GEL 12-7 (HEMOSTASIS) ×1 IMPLANT
STRIP CLOSURE SKIN 1/2X4 (GAUZE/BANDAGES/DRESSINGS) ×1 IMPLANT
SUT BONE WAX W31G (SUTURE) IMPLANT
SUT ETHIBOND 2 OS 4 DA (SUTURE) IMPLANT
SUT ETHILON 4 0 PS 2 18 (SUTURE) ×2 IMPLANT
SUT FIBERWIRE #2 38 T-5 BLUE (SUTURE)
SUT PROLENE 3 0 PS 2 (SUTURE) ×1 IMPLANT
SUT TIGER TAPE 7 IN WHITE (SUTURE) IMPLANT
SUT VIC AB 0 CT2 27 (SUTURE) ×2 IMPLANT
SUT VIC AB 1-0 CT2 27 (SUTURE) IMPLANT
SUT VIC AB 2-0 CT2 27 (SUTURE) IMPLANT
SUT VIC AB 2-0 SH 27 (SUTURE) ×4
SUT VIC AB 2-0 SH 27XBRD (SUTURE) IMPLANT
SUT VICRYL 0 TIES 12 18 (SUTURE) IMPLANT
SUT VICRYL 0 UR6 27IN ABS (SUTURE) ×1 IMPLANT
SUTURE FIBERWR #2 38 T-5 BLUE (SUTURE) IMPLANT
TAPE FIBER 2MM 7IN #2 BLUE (SUTURE) IMPLANT
TISSUE ARTHROFLEX THICK 4 (Tissue) ×2 IMPLANT
TOWEL OR 17X26 10 PK STRL BLUE (TOWEL DISPOSABLE) ×2 IMPLANT
TOWEL OR NON WOVEN STRL DISP B (DISPOSABLE) IMPLANT
TUBING ARTHROSCOPY IRRIG 16FT (MISCELLANEOUS) ×2 IMPLANT
WATER STERILE IRR 500ML POUR (IV SOLUTION) ×2 IMPLANT
WIPE CHG CHLORHEXIDINE 2% (PERSONAL CARE ITEMS) ×2 IMPLANT

## 2021-09-20 NOTE — Brief Op Note (Signed)
09/20/2021  3:32 PM  PATIENT:  Carrie Barnes  86 y.o. female  PRE-OPERATIVE DIAGNOSIS:  Right rotator cuff tear  POST-OPERATIVE DIAGNOSIS:  Right rotator cuff tear  PROCEDURE:  Procedure(s) with comments: SHOULDER MINI OPEN ROTATOR CUFF REPAIR AND SUBACROMIAL DECOMPRESSION WITH POSSIBLE PATCH GRAFT (Right) - 90 MINS  SURGEON:  Surgeon(s) and Role:    Jene Every, MD - Primary  PHYSICIAN ASSISTANT:   ASSISTANTS: Bissell   ANESTHESIA:   general  EBL:  min   BLOOD ADMINISTERED:none  DRAINS: none   LOCAL MEDICATIONS USED:  MARCAINE     SPECIMEN:  No Specimen  DISPOSITION OF SPECIMEN:  N/A  COUNTS:  YES  TOURNIQUET:  * No tourniquets in log *  DICTATION: .Other Dictation: Dictation Number   [id number]  PLAN OF CARE: Admit for overnight observation  PATIENT DISPOSITION:  PACU - hemodynamically stable.   Delay start of Pharmacological VTE agent (>24hrs) due to surgical blood loss or risk of bleeding: no

## 2021-09-20 NOTE — Discharge Instructions (Signed)

## 2021-09-20 NOTE — Anesthesia Procedure Notes (Signed)
Procedure Name: Intubation Date/Time: 09/20/2021 1:29 PM Performed by: Garrel Ridgel, CRNA Pre-anesthesia Checklist: Patient identified, Emergency Drugs available, Suction available and Patient being monitored Patient Re-evaluated:Patient Re-evaluated prior to induction Oxygen Delivery Method: Circle system utilized Preoxygenation: Pre-oxygenation with 100% oxygen Induction Type: IV induction Ventilation: Mask ventilation without difficulty Laryngoscope Size: Mac and 3 Grade View: Grade I Tube type: Oral Tube size: 7.0 mm Number of attempts: 1 Airway Equipment and Method: Stylet and Oral airway Placement Confirmation: ETT inserted through vocal cords under direct vision, positive ETCO2 and breath sounds checked- equal and bilateral Secured at: 22 cm Tube secured with: Tape Dental Injury: Teeth and Oropharynx as per pre-operative assessment

## 2021-09-20 NOTE — Plan of Care (Signed)
°  Problem: Education: Goal: Knowledge of General Education information will improve Description: Including pain rating scale, medication(s)/side effects and non-pharmacologic comfort measures Outcome: Progressing   Problem: Clinical Measurements: Goal: Ability to maintain clinical measurements within normal limits will improve Outcome: Progressing   Problem: Pain Managment: Goal: General experience of comfort will improve Outcome: Progressing   Problem: Safety: Goal: Ability to remain free from injury will improve Outcome: Progressing   Problem: Education: Goal: Knowledge of the prescribed therapeutic regimen will improve Outcome: Progressing   Problem: Activity: Goal: Ability to tolerate increased activity will improve Outcome: Progressing

## 2021-09-20 NOTE — Transfer of Care (Signed)
Immediate Anesthesia Transfer of Care Note  Patient: Carrie Barnes  Procedure(s) Performed: SHOULDER MINI OPEN ROTATOR CUFF REPAIR AND SUBACROMIAL DECOMPRESSION WITH POSSIBLE PATCH GRAFT (Right)  Patient Location: PACU  Anesthesia Type:General  Level of Consciousness: sedated  Airway & Oxygen Therapy: Patient Spontanous Breathing and Patient connected to face mask oxygen  Post-op Assessment: Report given to RN and Post -op Vital signs reviewed and stable  Post vital signs: Reviewed and stable  Last Vitals:  Vitals Value Taken Time  BP 124/88 09/20/21 1541  Temp    Pulse 88 09/20/21 1544  Resp 24 09/20/21 1544  SpO2 99 % 09/20/21 1544  Vitals shown include unvalidated device data.  Last Pain:  Vitals:   09/20/21 1145  TempSrc:   PainSc: Asleep         Complications: No notable events documented.

## 2021-09-20 NOTE — Interval H&P Note (Signed)
History and Physical Interval Note:  09/20/2021 1:07 PM  Carrie Barnes  has presented today for surgery, with the diagnosis of Right rotator cuff tear.  The various methods of treatment have been discussed with the patient and family. After consideration of risks, benefits and other options for treatment, the patient has consented to  Procedure(s) with comments: SHOULDER ARTHROSCOPY WITH MINI OPEN ROTATOR CUFF REPAIR AND SUBACROMIAL DECOMPRESSION WITH POSSIBLE PATCH GRAFT (Right) - 90 MINS as a surgical intervention.  The patient's history has been reviewed, patient examined, no change in status, stable for surgery.  I have reviewed the patient's chart and labs.  Questions were answered to the patient's satisfaction.     Johnn Hai

## 2021-09-20 NOTE — Progress Notes (Signed)
Assisted Dr. Oddono with right, ultrasound guided, interscalene  block. Side rails up, monitors on throughout procedure. See vital signs in flow sheet. Tolerated Procedure well. 

## 2021-09-20 NOTE — Anesthesia Procedure Notes (Signed)
Anesthesia Regional Block: Interscalene brachial plexus block   Pre-Anesthetic Checklist: , timeout performed,  Correct Patient, Correct Site, Correct Laterality,  Correct Procedure, Correct Position, site marked,  Risks and benefits discussed,  Surgical consent,  Pre-op evaluation,  At surgeon's request and post-op pain management  Laterality: Right  Prep: chloraprep       Needles:  Injection technique: Single-shot  Needle Type: Echogenic Stimulator Needle     Needle Length: 5cm  Needle Gauge: 22     Additional Needles:   Procedures:, nerve stimulator,,, ultrasound used (permanent image in chart),,     Nerve Stimulator or Paresthesia:  Response: hand, 0.45 mA  Additional Responses:   Narrative:  Start time: 09/20/2021 11:40 AM End time: 09/20/2021 11:45 AM Injection made incrementally with aspirations every 5 mL.  Performed by: Personally  Anesthesiologist: Janeece Riggers, MD  Additional Notes: Functioning IV was confirmed and monitors were applied.  A 45mm 22ga Arrow echogenic stimulator needle was used. Sterile prep and drape,hand hygiene and sterile gloves were used. Ultrasound guidance: relevant anatomy identified, needle position confirmed, local anesthetic spread visualized around nerve(s)., vascular puncture avoided.  Image printed for medical record. Negative aspiration and negative test dose prior to incremental administration of local anesthetic. The patient tolerated the procedure well.

## 2021-09-21 DIAGNOSIS — I2510 Atherosclerotic heart disease of native coronary artery without angina pectoris: Secondary | ICD-10-CM | POA: Diagnosis not present

## 2021-09-21 DIAGNOSIS — I10 Essential (primary) hypertension: Secondary | ICD-10-CM | POA: Diagnosis not present

## 2021-09-21 DIAGNOSIS — M75121 Complete rotator cuff tear or rupture of right shoulder, not specified as traumatic: Secondary | ICD-10-CM | POA: Diagnosis not present

## 2021-09-21 DIAGNOSIS — Z7982 Long term (current) use of aspirin: Secondary | ICD-10-CM | POA: Diagnosis not present

## 2021-09-21 DIAGNOSIS — Z87891 Personal history of nicotine dependence: Secondary | ICD-10-CM | POA: Diagnosis not present

## 2021-09-21 DIAGNOSIS — Z79899 Other long term (current) drug therapy: Secondary | ICD-10-CM | POA: Diagnosis not present

## 2021-09-21 LAB — BASIC METABOLIC PANEL
Anion gap: 7 (ref 5–15)
BUN: 14 mg/dL (ref 8–23)
CO2: 26 mmol/L (ref 22–32)
Calcium: 8.5 mg/dL — ABNORMAL LOW (ref 8.9–10.3)
Chloride: 102 mmol/L (ref 98–111)
Creatinine, Ser: 0.81 mg/dL (ref 0.44–1.00)
GFR, Estimated: >60 mL/min (ref >60)
Glucose, Bld: 158 mg/dL — ABNORMAL HIGH (ref 70–99)
Potassium: 4.2 mmol/L (ref 3.5–5.1)
Sodium: 135 mmol/L (ref 135–145)

## 2021-09-21 NOTE — Op Note (Signed)
NAME: Carrie Barnes, Carrie Barnes MEDICAL RECORD NO: [id number] ACCOUNT NO: [id number] DATE OF BIRTH: [birth year] FACILITY: [facility]ATION: [facility] PHYSICIAN: Johnn Hai, MD  Operative Report   DATE OF PROCEDURE: 09/20/2021  PREOPERATIVE DIAGNOSIS:  Retracted tear of the rotator cuff on the right.  POSTOPERATIVE DIAGNOSIS:  Retracted tear of the rotator cuff on the right.  PROCEDURE PERFORMED: 1.  Mini open rotator cuff repair and subacromial decompression, right. 2.  Application of Arthrex patch graft.  ANESTHESIA:  General with regional block.  ASSISTANT: Lacie Draft, PA  HISTORY:  This is an 86 year old with retracted tear of the rotator cuff, persistent pain torn infraspinatus and supraspinatus.  She was indicated for repair of the rotator cuff.  Risks and benefits discussed including bleeding, infection, damage to  neurovascular structures, no change in symptoms, worsening symptoms, DVT, PE, anesthetic complications, etc.  DESCRIPTION OF PROCEDURE:  The patient in supine beach chair position.  After induction of adequate general anesthesia, 2 grams Kefzol, the right shoulder and upper extremity was prepped and draped in the usual sterile fashion.  Surgical marker utilized  to delineate the acromion, AC joint and coracoid.  A 3 cm incision was made over the anterolateral aspect of the acromion.  After infiltration with 0.25% Marcaine with epinephrine, subcutaneous tissue was dissected, electrocautery was utilized to achieve  hemostasis.  The raphae between the anterior and lateral heads was divided in line with skin incision.  A self-retaining retractor was then placed.  We entered the subacromial space.  Irrigant was utilized to lavage the joint.  There was a retracted  tear of the rotator cuff.  Exposed the greater tuberosity.  Excised the bursa.  I removed a small spur off the anterior lateral aspect of the acromion with a 3 mm Kerrison.  I then began mobilizing the cuff, supraspinatus  and infraspinatus and portion of  the subscap.  There was some attenuation and tearing of the infraspinatus posteriorly and portion of the supraspinatus.  I used a Beyer rongeur to decorticate lateral to the articular surface through the bone, creating a good cancellous bed.  I then  placed a single Arthrex anchor at the lateral aspect of the articular surface.  Good resistance to pullout.  She had soft bone.  I then used the TigerTape ends and passed 4 ligatures through the infraspinatus and supraspinatus utilizing a Scorpion suture  passer.  These were then crossed and advanced the tendons into the cancellous bed.  There was a portion of the mid portion of the tendon that was attenuated as was the lateral aspect of the tendon.  The tendon basically mobilized to the medial aspect of  the greater tuberosity.  A portion of the center portion of the supraspinatus posteriorly was absent.  There was some attenuation.  I thought augmentation of the cuff with a graft would be appropriate, a graft was then utilized.  I anchored the graft  just proximal to the leading edge of the rotator cuff tendon, the supraspinatus and infraspinatus.  This was secured with Arthrex staples.  They secured the graft proximally.  Then, the graft was laterally and anteriorly sutured to the leading edge of  the attached portion of the infraspinatus with 2-0 Vicryl interrupted sutures and anteriorly to the leading edge of the subscap into residual periosteum laterally.  This covered the greater tuberosity cancellous bed entirely, a bridge type graft.  This  was secured medially, anteriorly and posteriorly.  Again, a small portion of the posterior supraspinatus was absent.  Copiously irrigated the wound throughout.  Inspection revealed good coverage.  The sutures that were passed with a Scorpion suture  passer were crossed and placed into a second row fixation with two SwiveLocks near the lateral aspect of the greater tuberosity.  One  had to be repositioned due to osteoporotic bone.  As these were crossed, I did pull down the edge of the rotator cuff  tendon without undue tension.  Excellent resistance, we had resistance to pullout of both SwiveLocks.  Redundant suture was removed.  This was performed prior to application of the graft.  Again, the graft was used to augment the posterior mid portion of  the tendon as well as the leading edge of the mobilized tendon and the lateral aspect of the greater tuberosity.  Tendon was mobilized approximately 1.5 cm.  Injected with 0.25% Marcaine with epinephrine.  After copious irrigation, the raphae was closed  with 0 Vicryl in interrupted figure-of-eight sutures, subcutaneous with 2-0 and skin with subcuticular Prolene.  Sterile dressing applied, placed an abduction pillow, extubated and transported to the recovery room in satisfactory condition.  The patient tolerated the procedure well.  No complications.  Assistant, Lacie Draft, [place], was used throughout the case to apply traction to the arm, suction, closure and patient positioning.  BLOOD LOSS:  20 mL.   SHW D: 09/20/2021 3:41:41 pm T: 09/21/2021 2:30:00 am  JOB: [id number]

## 2021-09-21 NOTE — Evaluation (Signed)
Occupational Therapy Evaluation Patient Details Name: Carrie Barnes MRN: [id number] DOB: [birth year] Today's Date: 09/21/2021   History of Present Illness Patient s/p Mini open rotator cuff repair and subacromial decompression, right.   Clinical Impression   Carrie Barnes is an 86 year old woman s/p shoulder replacement without functional use of right dominant upper extremity secondary to effects of surgery and interscalene block and shoulder precautions. Therapist provided education and instruction to patient and daughter in regards to exercises, precautions, positioning, donning upper extremity clothing and bathing while maintaining shoulder precautions, ice and edema management and donning/doffing sling. Patient and daughter verbalized understanding and demonstrated as needed. Handouts provided to maximize retention of education. Patient to follow up with MD for further therapy needs.        Recommendations for follow up therapy are one component of a multi-disciplinary discharge planning process, led by the attending physician.  Recommendations may be updated based on patient status, additional functional criteria and insurance authorization.   Follow Up Recommendations  Follow physician's recommendations for discharge plan and follow up therapies    Assistance Recommended at Discharge Intermittent Supervision/Assistance  Patient can return home with the following A little help with bathing/dressing/bathroom;Assistance with cooking/housework    Functional Status Assessment  Patient has had a recent decline in their functional status and demonstrates the ability to make significant improvements in function in a reasonable and predictable amount of time.  Equipment Recommendations  None recommended by OT    Recommendations for Other Services       Precautions / Restrictions Precautions Precautions: Shoulder Type of Shoulder Precautions: No AROM, No PROM Shoulder  Interventions: Don joy ultra sling Precaution Booklet Issued:  (handouts) Required Braces or Orthoses: Sling Restrictions Weight Bearing Restrictions: Yes RUE Weight Bearing: Non weight bearing      Mobility Bed Mobility Overal bed mobility: Independent                  Transfers Overall transfer level: Independent                        Balance Overall balance assessment: Mild deficits observed, not formally tested                                         ADL either performed or assessed with clinical judgement   ADL                                         General ADL Comments: Overall min assist for ADLs except max assist for UB dressing and sling management. Daughter educated on how to don and doff sling.     Vision   Vision Assessment?: No apparent visual deficits     Perception     Praxis      Pertinent Vitals/Pain Pain Assessment Pain Assessment: No/denies pain     Hand Dominance     Extremity/Trunk Assessment Upper Extremity Assessment Upper Extremity Assessment: RUE deficits/detail RUE Deficits / Details: impaired AROM secondary to block RUE Sensation: decreased light touch   Lower Extremity Assessment Lower Extremity Assessment: Overall WFL for tasks assessed   Cervical / Trunk Assessment Cervical / Trunk Assessment: Normal   Communication     Cognition Arousal/Alertness: Awake/alert Behavior During Therapy: [facility] for  tasks assessed/performed Overall Cognitive Status: Within Functional Limits for tasks assessed                                       General Comments       Exercises     Shoulder Instructions Shoulder Instructions Donning/doffing shirt without moving shoulder: Caregiver independent with task Method for sponge bathing under operated UE: Independent Donning/doffing sling/immobilizer: Caregiver independent with task Correct positioning of sling/immobilizer:  Independent ROM for elbow, wrist and digits of operated UE: Independent Sling wearing schedule (on at all times/off for ADL's): Independent Proper positioning of operated UE when showering: Independent Dressing change: Independent Positioning of UE while sleeping: Independent    Home Living Family/patient expects to be discharged to:: Private residence Living Arrangements: Children Available Help at Discharge: Available PRN/intermittently                                    Prior Functioning/Environment                          OT Problem List: Decreased strength;Decreased range of motion;Impaired UE functional use;Pain      OT Treatment/Interventions:      OT Goals(Current goals can be found in the care plan section) Acute Rehab OT Goals OT Goal Formulation: All assessment and education complete, DC therapy  OT Frequency:      Co-evaluation              AM-PAC OT "6 Clicks" Daily Activity     Outcome Measure Help from another person eating meals?: A Little Help from another person taking care of personal grooming?: A Little Help from another person toileting, which includes using toliet, bedpan, or urinal?: A Little Help from another person bathing (including washing, rinsing, drying)?: A Little Help from another person to put on and taking off regular upper body clothing?: A Lot Help from another person to put on and taking off regular lower body clothing?: A Little 6 Click Score: 17   End of Session Nurse Communication:  (OT education complete)  Activity Tolerance: Patient tolerated treatment well Patient left: in chair;with family/visitor present  OT Visit Diagnosis: Muscle weakness (generalized) (M62.81)                Time: 2706-2376 OT Time Calculation (min): 15 min Charges:  OT General Charges $OT Visit: 1 Visit OT Evaluation $OT Eval Low Complexity: 1 Low  Makaley Storts, OTR/L Acute Care Rehab Services  Office [phone] Pager:  [phone]   Kelli Churn 09/21/2021, 10:18 AM

## 2021-09-21 NOTE — Progress Notes (Signed)
Subjective: 1 Day Post-Op Procedure(s) (LRB): SHOULDER MINI OPEN ROTATOR CUFF REPAIR AND SUBACROMIAL DECOMPRESSION WITH POSSIBLE PATCH GRAFT (Right) Patient reports pain as 3 on 0-10 scale.   Denies CP or SOB.  Voiding without difficulty. Positive flatus. Objective: Vital signs in last 24 hours: Temp:  [97.7 F (36.5 C)-98.1 F (36.7 C)] 98 F (36.7 C) (02/11 0447) Pulse Rate:  [59-90] 79 (02/11 0447) Resp:  [10-21] 17 (02/11 0447) BP: (69-150)/(45-88) 144/77 (02/11 0447) SpO2:  [95 %-100 %] 96 % (02/11 0747) Weight:  [70.1 kg] 70.1 kg (02/10 1035)  Intake/Output from previous day: 02/10 0701 - 02/11 0700 In: 2176.6 [P.O.:360; I.V.:1716.6; IV Piggyback:100] Out: -  Intake/Output this shift: No intake/output data recorded.  No results for input(s): HGB in the last 72 hours. No results for input(s): WBC, RBC, HCT, PLT in the last 72 hours. Recent Labs    09/21/21 0316  NA 135  K 4.2  CL 102  CO2 26  BUN 14  CREATININE 0.81  GLUCOSE 158*  CALCIUM 8.5*   No results for input(s): LABPT, INR in the last 72 hours.  Intact pulses distally Incision: dressing C/D/I Block partial Good cap refill  Assessment/Plan:  1 Day Post-Op Procedure(s) (LRB): SHOULDER MINI OPEN ROTATOR CUFF REPAIR AND SUBACROMIAL DECOMPRESSION WITH POSSIBLE PATCH GRAFT (Right) Advance diet Up with therapy Discharge home with home health   Principal Problem:   Complete rotator cuff tear      Javier Docker 09/21/2021, @[facility] 

## 2021-09-22 NOTE — Anesthesia Postprocedure Evaluation (Signed)
Anesthesia Post Note  Patient: Carrie Barnes  Procedure(s) Performed: SHOULDER MINI OPEN ROTATOR CUFF REPAIR AND SUBACROMIAL DECOMPRESSION WITH POSSIBLE PATCH GRAFT (Right)     Patient location during evaluation: PACU Anesthesia Type: General and Regional Level of consciousness: awake and alert Pain management: pain level controlled Vital Signs Assessment: post-procedure vital signs reviewed and stable Respiratory status: spontaneous breathing, nonlabored ventilation, respiratory function stable and patient connected to nasal cannula oxygen Cardiovascular status: blood pressure returned to baseline and stable Postop Assessment: no apparent nausea or vomiting Anesthetic complications: no   No notable events documented.  Last Vitals:  Vitals:   09/21/21 0747 09/21/21 0925  BP:  115/75  Pulse:  (!) 59  Resp:    Temp:  36.8 C  SpO2: 96% 96%    Last Pain:  Vitals:   09/21/21 0925  TempSrc: Oral  PainSc:                  [name]

## 2021-09-23 ENCOUNTER — Ambulatory Visit: Payer: Medicare Other | Admitting: Cardiovascular Disease

## 2021-09-23 ENCOUNTER — Encounter (HOSPITAL_COMMUNITY): Payer: Self-pay | Admitting: Specialist

## 2021-09-25 NOTE — Discharge Summary (Signed)
Patient ID: Carrie Barnes MRN: [id number] DOB/AGE: [birth year] 84 y.o.  Admit date: 09/20/2021 Discharge date: 09/25/2021  Admission Diagnoses:  Principal Problem:   Complete rotator cuff tear   Discharge Diagnoses:  Same  Past Medical History:  Diagnosis Date   Cholecystitis    Coronary artery disease    a. 03/2017: 80-90% LAD stenosis (PCI/DES placement with a 2.75x16 mm Promus Premier stent). No significant stenosis along RCA or LCx.    DJD (degenerative joint disease)    GERD (gastroesophageal reflux disease)    Hyperlipidemia    Hypertension    dx s/p MI    Ischemic cardiomyopathy    Myocardial infarction (HCC) 03/17/2017   03-2017 treated at [facility]    Osteoporosis    Status post insertion of drug-eluting stent into left anterior descending (LAD) artery 03/2017    Surgeries: Procedure(s): SHOULDER MINI OPEN ROTATOR CUFF REPAIR AND SUBACROMIAL DECOMPRESSION WITH POSSIBLE PATCH GRAFT on 09/20/2021   Consultants:   Discharged Condition: Improved  Hospital Course: Carrie Barnes is an 86 y.o. female who was admitted 09/20/2021 for operative treatment ofComplete rotator cuff tear. Patient has severe unremitting pain that affects sleep, daily activities, and work/hobbies. After pre-op clearance the patient was taken to the operating room on 09/20/2021 and underwent  Procedure(s): SHOULDER MINI OPEN ROTATOR CUFF REPAIR AND SUBACROMIAL DECOMPRESSION WITH POSSIBLE PATCH GRAFT.    Patient was given perioperative antibiotics:  Anti-infectives (From admission, onward)    Start     Dose/Rate Route Frequency Ordered Stop   09/21/21 1000  methenamine (MANDELAMINE) tablet 1 g  Status:  Discontinued        1 g Oral 2 times daily 09/20/21 1737 09/21/21 1737   09/20/21 2000  ceFAZolin (ANCEF) IVPB 1 g/50 mL premix        1 g 100 mL/hr over 30 Minutes Intravenous Every 6 hours 09/20/21 1737 09/21/21 0248   09/20/21 1045  ceFAZolin (ANCEF) IVPB 2g/100 mL premix         2 g 200 mL/hr over 30 Minutes Intravenous On call to O.R. 09/20/21 1032 09/20/21 1344        Patient was given sequential compression devices, early ambulation, and chemoprophylaxis to prevent DVT.  Patient benefited maximally from hospital stay and there were no complications.    Recent vital signs: No data found.   Recent laboratory studies: No results for input(s): WBC, HGB, HCT, PLT, NA, K, CL, CO2, BUN, CREATININE, GLUCOSE, INR, CALCIUM in the last 72 hours.  Invalid input(s): PT, 2   Discharge Medications:   Allergies as of 09/21/2021   No Known Allergies      Medication List     STOP taking these medications    atorvastatin 80 MG tablet Commonly known as: LIPITOR       TAKE these medications    acetaminophen 500 MG tablet Commonly known as: TYLENOL Take 500-1,000 mg by mouth every 8 (eight) hours as needed for moderate pain or headache.   alendronate 70 MG tablet Commonly known as: FOSAMAX Take 70 mg by mouth every Monday.   aspirin EC 81 MG tablet Take 81 mg by mouth daily. Swallow whole.   CALCIUM 600/VITAMIN D PO Take 1 tablet by mouth in the morning and at bedtime.   cetirizine 10 MG tablet Commonly known as: ZYRTEC Take 10 mg by mouth daily as needed for allergies.   CRANBERRY EXTRACT PO Take 25,000 mg by mouth daily.   docusate sodium 100 MG capsule  Commonly known as: Colace Take 1 capsule (100 mg total) by mouth 2 (two) times daily as needed for mild constipation.   estradiol 0.1 MG/GM vaginal cream Commonly known as: ESTRACE Place vaginally 2 (two) times a week. Peas sized amount   Fish Oil 1200 MG Caps Take 1,200 mg by mouth 2 (two) times daily.   hydrochlorothiazide 25 MG tablet Commonly known as: HYDRODIURIL Take 1 tablet (25 mg total) by mouth daily. What changed:  when to take this reasons to take this   HYDROcodone-acetaminophen 5-325 MG tablet Commonly known as: NORCO/VICODIN Take 1-2 tablets by mouth every 4 (four)  hours as needed for severe pain.   irbesartan 300 MG tablet Commonly known as: Avapro Take 1 tablet (300 mg total) by mouth daily.   latanoprost 0.005 % ophthalmic solution Commonly known as: XALATAN Place 1 drop into both eyes at bedtime.   methenamine 1 g tablet Commonly known as: HIPREX Take 1 g by mouth 2 (two) times daily.   metoprolol succinate 50 MG 24 hr tablet Commonly known as: TOPROL-XL Take 1 tablet (50 mg total) by mouth daily. Needs follow up appointment for further refills. What changed: when to take this   mirabegron ER 25 MG Tb24 tablet Commonly known as: MYRBETRIQ Take 25 mg by mouth daily.   Multi-Vitamins Tabs Take 1 tablet by mouth daily.   nitroGLYCERIN 0.4 MG SL tablet Commonly known as: NITROSTAT Place 1 tablet (0.4 mg total) under the tongue every 5 (five) minutes as needed for chest pain.   omeprazole 20 MG capsule Commonly known as: PRILOSEC Take 20 mg by mouth daily.   polyethylene glycol 17 g packet Commonly known as: MIRALAX / GLYCOLAX Take 17 g by mouth daily.   SYSTANE OP Place 1 drop into both eyes daily as needed (dry eyes).   timolol 0.5 % ophthalmic solution Commonly known as: TIMOPTIC Place 1 drop into the left eye 2 (two) times daily.   traZODone 50 MG tablet Commonly known as: DESYREL Take 50 mg by mouth at bedtime as needed for sleep.   Trunature Digestive Probiotic Caps Take 1 capsule by mouth daily.   vitamin C 500 MG tablet Commonly known as: ASCORBIC ACID Take 500 mg by mouth daily.   Vitamin D3 50 MCG (2000 UT) capsule Take 2,000 Units by mouth daily.        Diagnostic Studies: No results found.  Disposition: Discharge disposition: 01-Home or Self Care       Discharge Instructions     Call MD / Call 911   Complete by: As directed    If you experience chest pain or shortness of breath, CALL 911 and be transported to the hospital emergency room.  If you develope a fever above 101 F, pus (white  drainage) or increased drainage or redness at the wound, or calf pain, call your surgeon's office.   Constipation Prevention   Complete by: As directed    Drink plenty of fluids.  Prune juice may be helpful.  You may use a stool softener, such as Colace (over the counter) 100 mg twice a day.  Use MiraLax (over the counter) for constipation as needed.   Diet - low sodium heart healthy   Complete by: As directed    Increase activity slowly as tolerated   Complete by: As directed    Post-operative opioid taper instructions:   Complete by: As directed    POST-OPERATIVE OPIOID TAPER INSTRUCTIONS: It is important to wean off of your  opioid medication as soon as possible. If you do not need pain medication after your surgery it is ok to stop day one. Opioids include: Codeine, Hydrocodone(Norco, Vicodin), Oxycodone(Percocet, oxycontin) and hydromorphone amongst others.  Long term and even short term use of opiods can cause: Increased pain response Dependence Constipation Depression Respiratory depression And more.  Withdrawal symptoms can include Flu like symptoms Nausea, vomiting And more Techniques to manage these symptoms Hydrate well Eat regular healthy meals Stay active Use relaxation techniques(deep breathing, meditating, yoga) Do Not substitute Alcohol to help with tapering If you have been on opioids for less than two weeks and do not have pain than it is ok to stop all together.  Plan to wean off of opioids This plan should start within one week post op of your joint replacement. Maintain the same interval or time between taking each dose and first decrease the dose.  Cut the total daily intake of opioids by one tablet each day Next start to increase the time between doses. The last dose that should be eliminated is the evening dose.           Follow-up Information     Jene Every, MD Follow up in 2 week(s).   Specialty: Orthopedic Surgery Contact  information: [address] [place] [address] [place] [address] [phone]                  Signed: Dorothy Spark 09/25/2021, 11:08 AM

## 2021-09-27 ENCOUNTER — Other Ambulatory Visit: Payer: Self-pay

## 2021-09-27 ENCOUNTER — Encounter (HOSPITAL_BASED_OUTPATIENT_CLINIC_OR_DEPARTMENT_OTHER): Payer: Self-pay

## 2021-09-27 ENCOUNTER — Emergency Department (HOSPITAL_BASED_OUTPATIENT_CLINIC_OR_DEPARTMENT_OTHER)
Admission: EM | Admit: 2021-09-27 | Discharge: 2021-09-27 | Disposition: A | Discharge status: Home / Self Care | Payer: Medicare Other | Attending: Emergency Medicine | Admitting: Emergency Medicine

## 2021-09-27 DIAGNOSIS — Z79899 Other long term (current) drug therapy: Secondary | ICD-10-CM | POA: Diagnosis not present

## 2021-09-27 DIAGNOSIS — R519 Headache, unspecified: Secondary | ICD-10-CM | POA: Diagnosis not present

## 2021-09-27 DIAGNOSIS — Z7982 Long term (current) use of aspirin: Secondary | ICD-10-CM | POA: Insufficient documentation

## 2021-09-27 DIAGNOSIS — I10 Essential (primary) hypertension: Secondary | ICD-10-CM | POA: Insufficient documentation

## 2021-09-27 NOTE — ED Triage Notes (Signed)
States she's been having problems with her blood pressure being to high.  She last checked at 0115 and it was 190/110.

## 2021-09-27 NOTE — ED Provider Notes (Signed)
[facility] [place] EMERGENCY DEPT Provider Note   CSN: [id number] Arrival date & time: 09/27/21  0147     History  Chief Complaint  Patient presents with   Hypertension    Carrie Barnes is a 86 y.o. female.  The history is provided by the patient.  Hypertension This is a recurrent problem. The problem occurs daily. The problem has been gradually worsening. Associated symptoms include headaches. Pertinent negatives include no chest pain and no shortness of breath. Nothing aggravates the symptoms. Nothing relieves the symptoms.  Patient with history of hypertension presents with elevated blood pressure Patient underwent right shoulder open rotator cuff repair on February 10.  There were no complications She has had her pain well controlled, however she is not sleeping well.  She has not been taking her trazodone for sleep  She reports that she has noted her blood pressures been elevated, BP reading prior to arrival was 190/110.  She has mild headache, but no other acute complaints. She takes Toprol, Avapro and has HCTZ but did not start it until yesterday morning.    Home Medications Prior to Admission medications   Medication Sig Start Date End Date Taking? Authorizing Provider  acetaminophen (TYLENOL) 500 MG tablet Take 500-1,000 mg by mouth every 8 (eight) hours as needed for moderate pain or headache.    [provider]  alendronate (FOSAMAX) 70 MG tablet Take 70 mg by mouth every Monday.    [provider]  aspirin EC 81 MG tablet Take 81 mg by mouth daily. Swallow whole.    [provider]  Calcium Carb-Cholecalciferol (CALCIUM 600/VITAMIN D PO) Take 1 tablet by mouth in the morning and at bedtime.    [provider]  cetirizine (ZYRTEC) 10 MG tablet Take 10 mg by mouth daily as needed for allergies.     [provider]  Cholecalciferol (VITAMIN D3) 2000 units capsule Take 2,000 Units by mouth daily.    [provider]  CRANBERRY EXTRACT PO Take 25,000 mg by mouth daily.    [provider]  docusate sodium (COLACE) 100 MG capsule Take 1 capsule (100 mg total) by mouth 2 (two) times daily as needed for mild constipation. 09/20/21   Susa Day, MD  estradiol (ESTRACE) 0.1 MG/GM vaginal cream Place vaginally 2 (two) times a week. Peas sized amount 07/25/21   [provider]  hydrochlorothiazide (HYDRODIURIL) 25 MG tablet Take 1 tablet (25 mg total) by mouth daily. Patient taking differently: Take 25 mg by mouth daily as needed (high blood pressure). 05/16/21   Drenda Freeze, MD  HYDROcodone-acetaminophen (NORCO/VICODIN) 5-325 MG tablet Take 1-2 tablets by mouth every 4 (four) hours as needed for severe pain. 09/20/21   Susa Day, MD  irbesartan (AVAPRO) 300 MG tablet Take 1 tablet (300 mg total) by mouth daily. 04/02/20   Burnell Blanks, MD  latanoprost (XALATAN) 0.005 % ophthalmic solution Place 1 drop into both eyes at bedtime. 08/12/21   [provider]  methenamine (HIPREX) 1 g tablet Take 1 g by mouth 2 (two) times daily. 08/22/21   [provider]  metoprolol succinate (TOPROL-XL) 50 MG 24 hr tablet Take 1 tablet (50 mg total) by mouth daily. Needs follow up appointment for further refills. Patient taking differently: Take 50 mg by mouth every evening. Needs follow up appointment for further refills. 07/13/20   Burnell Blanks, MD  mirabegron ER (MYRBETRIQ) 25 MG TB24 tablet Take 25 mg by mouth daily.  [provider]  Multiple Vitamin (MULTI-VITAMINS) TABS Take 1 tablet by mouth daily.    [provider]  nitroGLYCERIN (NITROSTAT) 0.4 MG SL tablet Place 1 tablet (0.4 mg total) under the tongue every 5 (five) minutes as needed for chest pain. 12/17/20   Burnell Blanks, MD  Omega-3 Fatty Acids (FISH OIL) 1200 MG CAPS Take 1,200 mg by mouth 2 (two) times daily.     [provider]  omeprazole (PRILOSEC) 20  MG capsule Take 20 mg by mouth daily.    [provider]  Polyethyl Glycol-Propyl Glycol (SYSTANE OP) Place 1 drop into both eyes daily as needed (dry eyes).    [provider]  polyethylene glycol (MIRALAX / GLYCOLAX) 17 g packet Take 17 g by mouth daily. 09/20/21   Susa Day, MD  Probiotic Product (TRUNATURE DIGESTIVE PROBIOTIC) CAPS Take 1 capsule by mouth daily.    [provider]  timolol (TIMOPTIC) 0.5 % ophthalmic solution Place 1 drop into the left eye 2 (two) times daily. 07/25/21   [provider]  traZODone (DESYREL) 50 MG tablet Take 50 mg by mouth at bedtime as needed for sleep.    [provider]  vitamin C (ASCORBIC ACID) 500 MG tablet Take 500 mg by mouth daily.    [provider]      Allergies    Patient has no known allergies.    Review of Systems   Review of Systems  Constitutional:  Negative for fever.  Eyes:  Negative for visual disturbance.  Respiratory:  Negative for shortness of breath.   Cardiovascular:  Negative for chest pain.  Musculoskeletal:  Positive for arthralgias.  Neurological:  Positive for headaches. Negative for speech difficulty and weakness.  Psychiatric/Behavioral:  Positive for sleep disturbance.   All other systems reviewed and are negative.  Physical Exam Updated Vital Signs BP (!) 170/143 (BP Location: Left Arm)    Pulse 69    Temp 97.7 F (36.5 C) (Oral)    Resp 18    Ht 1.575 m (5\' 2" )    Wt 69.9 kg    SpO2 99%    BMI 28.17 kg/m  Physical Exam CONSTITUTIONAL: Elderly but appears younger than stated age HEAD: Normocephalic/atraumatic EYES: EOMI/PERRL, no nystagmus, no ptosis ENMT: Mucous membranes moist NECK: supple no meningeal signs CV: S1/S2 noted, no murmurs/rubs/gallops noted LUNGS: Lungs are clear to auscultation bilaterally, no apparent distress ABDOMEN: soft, nontender, no rebound or guarding GU:no cva tenderness NEURO:Awake/alert, face symmetric, no arm or leg drift  is noted (right arm is in a shoulder sling) Cranial nerves 3/4/5/6/02/16/09/11/12 tested and intact Sensation to light touch intact in all extremities EXTREMITIES: pulses normal, right arm in shoulder sling SKIN: warm, color normal PSYCH: no abnormalities of mood noted  ED Results / Procedures / Treatments   Labs (all labs ordered are listed, but only abnormal results are displayed) Labs Reviewed - No data to display  EKG None  Radiology No results found.  Procedures Procedures    Medications Ordered in ED Medications - No data to display  ED Course/ Medical Decision Making/ A&P                           Medical Decision Making  Patient presents for asymptomatic hypertension.  She is smiling, laughing and in no acute distress No signs of hypertensive emergency at this time.  Other than a mild headache, she has no other acute complaints I  advised patient to continue her medicines and to start taking HCTZ daily.  Her BP is likely elevated due to recent sleep disruptions and recent surgery.  She will also take her trazodone nightly if she is having trouble sleeping.  She was advised not to take this at the same time as pain medication She will check her BP daily and if there is no improvement in a week she will call her PCP for further guidance She is safe for discharge home for uncomplicated illness.  We discussed return precaution I reviewed previous records and  she had recent labs performed No indication for further labs or imaging at this time        Final Clinical Impression(s) / ED Diagnoses Final diagnoses:  Primary hypertension    Rx / DC Orders ED Discharge Orders     None         Ripley Fraise, MD 09/27/21 [phone]

## 2021-10-03 DIAGNOSIS — E7800 Pure hypercholesterolemia, unspecified: Secondary | ICD-10-CM | POA: Diagnosis not present

## 2021-10-03 DIAGNOSIS — Z96611 Presence of right artificial shoulder joint: Secondary | ICD-10-CM | POA: Diagnosis not present

## 2021-10-03 DIAGNOSIS — I10 Essential (primary) hypertension: Secondary | ICD-10-CM | POA: Diagnosis not present

## 2021-10-07 DIAGNOSIS — M25511 Pain in right shoulder: Secondary | ICD-10-CM | POA: Diagnosis not present

## 2021-10-10 DIAGNOSIS — M25511 Pain in right shoulder: Secondary | ICD-10-CM | POA: Diagnosis not present

## 2021-10-14 DIAGNOSIS — I2510 Atherosclerotic heart disease of native coronary artery without angina pectoris: Secondary | ICD-10-CM | POA: Diagnosis not present

## 2021-10-14 DIAGNOSIS — Z1389 Encounter for screening for other disorder: Secondary | ICD-10-CM | POA: Diagnosis not present

## 2021-10-14 DIAGNOSIS — Z96611 Presence of right artificial shoulder joint: Secondary | ICD-10-CM | POA: Diagnosis not present

## 2021-10-14 DIAGNOSIS — I10 Essential (primary) hypertension: Secondary | ICD-10-CM | POA: Diagnosis not present

## 2021-10-14 DIAGNOSIS — I700 Atherosclerosis of aorta: Secondary | ICD-10-CM | POA: Diagnosis not present

## 2021-10-14 DIAGNOSIS — E7800 Pure hypercholesterolemia, unspecified: Secondary | ICD-10-CM | POA: Diagnosis not present

## 2021-10-14 DIAGNOSIS — Z0000 Encounter for general adult medical examination without abnormal findings: Secondary | ICD-10-CM | POA: Diagnosis not present

## 2021-10-15 DIAGNOSIS — H401122 Primary open-angle glaucoma, left eye, moderate stage: Secondary | ICD-10-CM | POA: Diagnosis not present

## 2021-10-15 DIAGNOSIS — M25511 Pain in right shoulder: Secondary | ICD-10-CM | POA: Diagnosis not present

## 2021-10-17 DIAGNOSIS — M25511 Pain in right shoulder: Secondary | ICD-10-CM | POA: Diagnosis not present

## 2021-10-22 DIAGNOSIS — M25511 Pain in right shoulder: Secondary | ICD-10-CM | POA: Diagnosis not present

## 2021-10-29 DIAGNOSIS — M25511 Pain in right shoulder: Secondary | ICD-10-CM | POA: Diagnosis not present

## 2021-10-31 ENCOUNTER — Encounter: Payer: Self-pay | Admitting: Cardiovascular Disease

## 2021-10-31 DIAGNOSIS — M25511 Pain in right shoulder: Secondary | ICD-10-CM | POA: Diagnosis not present

## 2021-11-02 DIAGNOSIS — N390 Urinary tract infection, site not specified: Secondary | ICD-10-CM | POA: Diagnosis not present

## 2021-11-04 DIAGNOSIS — E871 Hypo-osmolality and hyponatremia: Secondary | ICD-10-CM | POA: Diagnosis not present

## 2021-11-04 NOTE — Progress Notes (Signed)
Patient ID: Carrie Barnes                 DOB: February 06, 1936                      MRN: [id number] ? ? ? ? ?HPI: ?Carrie Barnes is a 86 y.o. female referred by Dr. Clifton James to HTN clinic. PMH is significant for MI (2018) s/p PCI to LAD, HLD, HTN, ischemic cardiomyopathy (last EF 60-65% on 07/12/19). Patient was last seen by Dr. Clifton James in February 2022 when BP was at goal on irbesartan and metoprolol. HCTZ was added following an ED visit 05/16/21 for hypertensive urgency. She most recently had an ED visit for HTN on 09/27/21 with BP elevated to 184/79. This was about a week after she had a rotator cuff repair. She reported she had just restarted taking HCTZ the day prior, so she was discharged home with no medication changes. She then messaged on 10/31/21 via MyChart reporting low BP. She reported not taking irbesartan when BP 89/53-109/50 and was scheduled with the HTN clinic. She has been followed previously by the HTN clinic in 2021.  ? ?Today, patient arrives in good spirits. She reports that she passed out last Monday 10/28/21, and she stopped taking irbesartan and HCTZ about a week ago. BP has mostly remained at goal since discontinuing these (see below). No dizziness since then. Denies headaches, blurred vision, swelling. Brings home BP cuff to provide log of readings and for calibration with clinic cuff. Home cuff read 118/72, HR 66. Clinic cuff read 108/62, HR 66. Reports she has lost about 10 lbs in the past couple of months. Of note, noticed that atorvastatin 80 mg is no longer on her medication list (listed as stop taking after hospital d/c). Asked patient if this has been stopped for some reason. She stated it has not be discontinued that she is aware of and she continues to take it. She has hx MI, so she should continue atorvastatin and it has been added back to her medication list.  ? ?Current HTN meds: irbesartan 300 mg daily, hydrochlorothiazide 25 mg daily, metoprolol succinate 50 mg daily ?Previously  tried: losartan (switched to irbesartan for more effective lowering) ?BP goal: <130/80 mmHg ? ?Family History: Heart disease in father ? ?Social History: Former smoker (quit over 30 years ago) ? ?Diet: Avoids salt, 1 cup of coffee in the morning ? ?Exercise: Currently doing PT for shoulder ? ?Home BP readings: Checks daily using upper arm Omron cuff. Reads about 10 mmHg systolic and diastolic compared to clinic cuff.  ?Today 117/63 (off irbesartan and HCTZ); 119/61, 124/73, 139/73, 146/78, 122/70, 109/50, 113/58, 86/51, 89/53, 100/56, 117/67, 116/64, 93/51, 100/55 ? ?Wt Readings from Last 3 Encounters:  ?09/27/21 154 lb (69.9 kg)  ?09/20/21 154 lb 8.7 oz (70.1 kg)  ?09/13/21 154 lb 9.6 oz (70.1 kg)  ? ?BP Readings from Last 3 Encounters:  ?09/27/21 (!) 184/79  ?09/21/21 115/75  ?09/13/21 (!) 140/52  ? ?Pulse Readings from Last 3 Encounters:  ?09/27/21 68  ?09/21/21 (!) 59  ?09/13/21 73  ? ? ?Renal function: ?CrCl cannot be calculated (Patient's most recent lab result is older than the maximum 21 days allowed.). ? ?Past Medical History:  ?Diagnosis Date  ? Cholecystitis   ? Coronary artery disease   ? a. 03/2017: 80-90% LAD stenosis (PCI/DES placement with a 2.75x16 mm Promus Premier stent). No significant stenosis along RCA or LCx.   ? DJD (degenerative joint  disease)   ? GERD (gastroesophageal reflux disease)   ? Hyperlipidemia   ? Hypertension   ? dx s/p MI   ? Ischemic cardiomyopathy   ? Myocardial infarction (HCC) 03/17/2017  ? 03-2017 treated at [facility]   ? Osteoporosis   ? Status post insertion of drug-eluting stent into left anterior descending (LAD) artery 03/2017  ? ? ?Current Outpatient Medications on File Prior to Visit  ?Medication Sig Dispense Refill  ? acetaminophen (TYLENOL) 500 MG tablet Take 500-1,000 mg by mouth every 8 (eight) hours as needed for moderate pain or headache.    ? alendronate (FOSAMAX) 70 MG tablet Take 70 mg by mouth every Monday.    ? aspirin EC 81 MG tablet Take  81 mg by mouth daily. Swallow whole.    ? Calcium Carb-Cholecalciferol (CALCIUM 600/VITAMIN D PO) Take 1 tablet by mouth in the morning and at bedtime.    ? cetirizine (ZYRTEC) 10 MG tablet Take 10 mg by mouth daily as needed for allergies.     ? Cholecalciferol (VITAMIN D3) 2000 units capsule Take 2,000 Units by mouth daily.    ? CRANBERRY EXTRACT PO Take 25,000 mg by mouth daily.    ? docusate sodium (COLACE) 100 MG capsule Take 1 capsule (100 mg total) by mouth 2 (two) times daily as needed for mild constipation. 30 capsule 1  ? estradiol (ESTRACE) 0.1 MG/GM vaginal cream Place vaginally 2 (two) times a week. Peas sized amount    ? hydrochlorothiazide (HYDRODIURIL) 25 MG tablet Take 1 tablet (25 mg total) by mouth daily. (Patient taking differently: Take 25 mg by mouth daily as needed (high blood pressure).) 30 tablet 0  ? HYDROcodone-acetaminophen (NORCO/VICODIN) 5-325 MG tablet Take 1-2 tablets by mouth every 4 (four) hours as needed for severe pain. 40 tablet 0  ? irbesartan (AVAPRO) 300 MG tablet Take 1 tablet (300 mg total) by mouth daily. 30 tablet 11  ? latanoprost (XALATAN) 0.005 % ophthalmic solution Place 1 drop into both eyes at bedtime.    ? methenamine (HIPREX) 1 g tablet Take 1 g by mouth 2 (two) times daily.    ? metoprolol succinate (TOPROL-XL) 50 MG 24 hr tablet Take 1 tablet (50 mg total) by mouth daily. Needs follow up appointment for further refills. (Patient taking differently: Take 50 mg by mouth every evening. Needs follow up appointment for further refills.) 90 tablet 0  ? mirabegron ER (MYRBETRIQ) 25 MG TB24 tablet Take 25 mg by mouth daily.    ? Multiple Vitamin (MULTI-VITAMINS) TABS Take 1 tablet by mouth daily.    ? nitroGLYCERIN (NITROSTAT) 0.4 MG SL tablet Place 1 tablet (0.4 mg total) under the tongue every 5 (five) minutes as needed for chest pain. 25 tablet 7  ? Omega-3 Fatty Acids (FISH OIL) 1200 MG CAPS Take 1,200 mg by mouth 2 (two) times daily.     ? omeprazole (PRILOSEC) 20  MG capsule Take 20 mg by mouth daily.    ? Polyethyl Glycol-Propyl Glycol (SYSTANE OP) Place 1 drop into both eyes daily as needed (dry eyes).    ? polyethylene glycol (MIRALAX / GLYCOLAX) 17 g packet Take 17 g by mouth daily. 14 each 0  ? Probiotic Product (TRUNATURE DIGESTIVE PROBIOTIC) CAPS Take 1 capsule by mouth daily.    ? timolol (TIMOPTIC) 0.5 % ophthalmic solution Place 1 drop into the left eye 2 (two) times daily.    ? traZODone (DESYREL) 50 MG tablet Take 50 mg by mouth  at bedtime as needed for sleep.    ? vitamin C (ASCORBIC ACID) 500 MG tablet Take 500 mg by mouth daily.    ? ?Current Facility-Administered Medications on File Prior to Visit  ?Medication Dose Route Frequency Provider Last Rate Last Admin  ? indomethacin (INDOCIN) 50 MG suppository 50 mg  50 mg Rectal Once Willis Modena, MD      ? ? ?No Known Allergies ? ? ?Assessment/Plan: ? ?1. Hypertension - BP is still at goal <130/80 mmHg after having stopped irbesartan and hydrochlorothiazide about a week ago due to hypotension and syncope. Unclear why BP has dropped so significantly considering she had an ED visit in February for HTN while on these medications, recent weight loss may be contributing. Will discontinue irbesartan and HCTZ and continue only metoprolol succinate 50 mg daily since home BP and clinic BP have been well controlled on this regimen. Will follow up in HTN clinic in 3-4 weeks for BP check to confirm BP remains at goal. She will continue to check BP daily and call if she experiences consistently elevated readings. If BP elevated in the future, would resume irbesartan at a low dose. Next visit with Dr. Clifton James in May.  ? ?Pervis Hocking, PharmD ?PGY2 Ambulatory Care Pharmacy Resident ?11/05/2021 2:24 PM ? ?

## 2021-11-05 ENCOUNTER — Ambulatory Visit (INDEPENDENT_AMBULATORY_CARE_PROVIDER_SITE_OTHER): Payer: Medicare Other | Admitting: Student-PharmD

## 2021-11-05 ENCOUNTER — Other Ambulatory Visit: Payer: Self-pay

## 2021-11-05 DIAGNOSIS — M25511 Pain in right shoulder: Secondary | ICD-10-CM | POA: Diagnosis not present

## 2021-11-05 DIAGNOSIS — I10 Essential (primary) hypertension: Secondary | ICD-10-CM

## 2021-11-05 MED ORDER — ATORVASTATIN CALCIUM 80 MG PO TABS: 80.0000 mg | ORAL_TABLET | Freq: Every day | ORAL | 3 refills | Status: DC

## 2021-11-05 NOTE — Patient Instructions (Signed)
It was nice to see you today! ? ?Your goal blood pressure is less than 130/80 mmHg. In clinic, your blood pressure was 108/62 mmHg. ? ?Medication Changes: ?Do not resume taking irbesartan or hydrochlorothiazide.  ? ?Continue metoprolol succinate 50 mg daily.  ? ?Monitor blood pressure at home daily and keep a log (on your phone or piece of paper) to bring with you to your next visit. Write down date, time, blood pressure and pulse. ? ?Keep up the good work with diet and exercise. Aim for a diet full of vegetables, fruit and lean meats (chicken, [place], fish). Try to limit salt intake by eating fresh or frozen vegetables (instead of canned), rinse canned vegetables prior to cooking and do not add any additional salt to meals.  ? ?Call us if your BP rises before your next appointment: [phone].  ? ?Next visit on Tuesday April 25 at 11am.  ? ? ?

## 2021-11-07 DIAGNOSIS — M25511 Pain in right shoulder: Secondary | ICD-10-CM | POA: Diagnosis not present

## 2021-11-12 DIAGNOSIS — M25511 Pain in right shoulder: Secondary | ICD-10-CM | POA: Diagnosis not present

## 2021-11-14 DIAGNOSIS — M25511 Pain in right shoulder: Secondary | ICD-10-CM | POA: Diagnosis not present

## 2021-11-19 DIAGNOSIS — M25511 Pain in right shoulder: Secondary | ICD-10-CM | POA: Diagnosis not present

## 2021-11-25 DIAGNOSIS — M25511 Pain in right shoulder: Secondary | ICD-10-CM | POA: Diagnosis not present

## 2021-11-28 DIAGNOSIS — M25511 Pain in right shoulder: Secondary | ICD-10-CM | POA: Diagnosis not present

## 2021-11-29 DIAGNOSIS — I2510 Atherosclerotic heart disease of native coronary artery without angina pectoris: Secondary | ICD-10-CM | POA: Diagnosis not present

## 2021-11-29 DIAGNOSIS — I10 Essential (primary) hypertension: Secondary | ICD-10-CM | POA: Diagnosis not present

## 2021-11-29 DIAGNOSIS — E7800 Pure hypercholesterolemia, unspecified: Secondary | ICD-10-CM | POA: Diagnosis not present

## 2021-11-29 DIAGNOSIS — G4700 Insomnia, unspecified: Secondary | ICD-10-CM | POA: Diagnosis not present

## 2021-11-29 NOTE — Progress Notes (Signed)
Patient ID: Carrie Barnes                 DOB: Aug 25, 1935                      MRN: [id number] ? ? ? ? ?HPI: ?Carrie Barnes is a 86 y.o. female referred by Dr. Clifton James to HTN clinic. PMH is significant for MI (2018) s/p PCI to LAD, HLD, HTN, ischemic cardiomyopathy (last EF 60-65% on 07/12/19). Patient was last seen by Dr. Clifton James in February 2022 when BP was at goal on irbesartan and metoprolol. HCTZ was added following an ED visit 05/16/21 for hypertensive urgency. She most recently had an ED visit for HTN on 09/27/21 with BP elevated to 184/79. This was about a week after she had a rotator cuff repair. She reported she had just restarted taking HCTZ the day prior, so she was discharged home with no medication changes. She then messaged on 10/31/21 via MyChart reporting low BP. She reported not taking irbesartan when BP 89/53-109/50 and was scheduled with the HTN clinic. At visit on 11/05/21, BP was 108/62 after stopping irbesartan and HCTZ. These were discontinued from her list and continued only metoprolol.  ? ?Today, patient arrives in good spirits. Denies dizziness, headaches, blurred vision, swelling. Brings home BP monitor for review of recent readings (see below) which have remained stable off of irbesartan and HCTZ.  ? ?Current HTN meds: metoprolol succinate 50 mg daily ?Previously tried: losartan (switched to irbesartan for more effective lowering), irbesartan and HCTZ (stopped due to syncope) ?BP goal: <130/80 mmHg ? ?Family History: Heart disease in father ? ?Social History: Former smoker (quit over 30 years ago) ? ?Diet: Avoids salt, 1 cup of coffee in the morning ? ?Exercise: Currently doing PT for shoulder; doing core exercises for back, trying to walk more ? ?Home BP readings: Checks daily using upper arm Omron cuff. Reads ~10 mmHg higher systolic and diastolic compared to clinic cuff.  ?Recent readings: 124/69, 131/66, 137/71, 109/65, 111/69, 128/69, 124/75, 126/71, 124/66, 131/71, 126/69, 134/79;  HR 70s ? ?Wt Readings from Last 3 Encounters:  ?12/03/21 156 lb 9.6 oz (71 kg)  ?09/27/21 154 lb (69.9 kg)  ?09/20/21 154 lb 8.7 oz (70.1 kg)  ? ?BP Readings from Last 3 Encounters:  ?12/03/21 118/72  ?11/05/21 108/62  ?09/27/21 (!) 184/79  ? ?Pulse Readings from Last 3 Encounters:  ?12/03/21 74  ?11/05/21 66  ?09/27/21 68  ? ?Renal function: ?CrCl cannot be calculated (Patient's most recent lab result is older than the maximum 21 days allowed.). ? ?Past Medical History:  ?Diagnosis Date  ? Cholecystitis   ? Coronary artery disease   ? a. 03/2017: 80-90% LAD stenosis (PCI/DES placement with a 2.75x16 mm Promus Premier stent). No significant stenosis along RCA or LCx.   ? DJD (degenerative joint disease)   ? GERD (gastroesophageal reflux disease)   ? Hyperlipidemia   ? Hypertension   ? dx s/p MI   ? Ischemic cardiomyopathy   ? Myocardial infarction (HCC) 03/17/2017  ? 03-2017 treated at [facility]   ? Osteoporosis   ? Status post insertion of drug-eluting stent into left anterior descending (LAD) artery 03/2017  ? ?Current Outpatient Medications on File Prior to Visit  ?Medication Sig Dispense Refill  ? acetaminophen (TYLENOL) 500 MG tablet Take 500-1,000 mg by mouth every 8 (eight) hours as needed for moderate pain or headache.    ? alendronate (FOSAMAX) 70 MG tablet  Take 70 mg by mouth every Monday.    ? aspirin EC 81 MG tablet Take 81 mg by mouth daily. Swallow whole.    ? atorvastatin (LIPITOR) 80 MG tablet Take 1 tablet (80 mg total) by mouth daily. 90 tablet 3  ? Calcium Carb-Cholecalciferol (CALCIUM 600/VITAMIN D PO) Take 1 tablet by mouth in the morning and at bedtime.    ? cetirizine (ZYRTEC) 10 MG tablet Take 10 mg by mouth daily as needed for allergies.     ? Cholecalciferol (VITAMIN D3) 2000 units capsule Take 2,000 Units by mouth daily.    ? CRANBERRY EXTRACT PO Take 25,000 mg by mouth daily.    ? docusate sodium (COLACE) 100 MG capsule Take 1 capsule (100 mg total) by mouth 2 (two) times  daily as needed for mild constipation. 30 capsule 1  ? estradiol (ESTRACE) 0.1 MG/GM vaginal cream Place vaginally 2 (two) times a week. Peas sized amount    ? HYDROcodone-acetaminophen (NORCO/VICODIN) 5-325 MG tablet Take 1-2 tablets by mouth every 4 (four) hours as needed for severe pain. 40 tablet 0  ? latanoprost (XALATAN) 0.005 % ophthalmic solution Place 1 drop into both eyes at bedtime.    ? methenamine (HIPREX) 1 g tablet Take 1 g by mouth 2 (two) times daily.    ? metoprolol succinate (TOPROL-XL) 50 MG 24 hr tablet Take 1 tablet (50 mg total) by mouth daily. Needs follow up appointment for further refills. (Patient taking differently: Take 50 mg by mouth every evening. Needs follow up appointment for further refills.) 90 tablet 0  ? mirabegron ER (MYRBETRIQ) 25 MG TB24 tablet Take 25 mg by mouth daily.    ? Multiple Vitamin (MULTI-VITAMINS) TABS Take 1 tablet by mouth daily.    ? nitroGLYCERIN (NITROSTAT) 0.4 MG SL tablet Place 1 tablet (0.4 mg total) under the tongue every 5 (five) minutes as needed for chest pain. 25 tablet 7  ? Omega-3 Fatty Acids (FISH OIL) 1200 MG CAPS Take 1,200 mg by mouth 2 (two) times daily.     ? omeprazole (PRILOSEC) 20 MG capsule Take 20 mg by mouth daily.    ? Polyethyl Glycol-Propyl Glycol (SYSTANE OP) Place 1 drop into both eyes daily as needed (dry eyes).    ? polyethylene glycol (MIRALAX / GLYCOLAX) 17 g packet Take 17 g by mouth daily. 14 each 0  ? Probiotic Product (TRUNATURE DIGESTIVE PROBIOTIC) CAPS Take 1 capsule by mouth daily.    ? timolol (TIMOPTIC) 0.5 % ophthalmic solution Place 1 drop into the left eye 2 (two) times daily.    ? traZODone (DESYREL) 50 MG tablet Take 50 mg by mouth at bedtime as needed for sleep.    ? vitamin C (ASCORBIC ACID) 500 MG tablet Take 500 mg by mouth daily.    ? ?Current Facility-Administered Medications on File Prior to Visit  ?Medication Dose Route Frequency Provider Last Rate Last Admin  ? indomethacin (INDOCIN) 50 MG suppository 50  mg  50 mg Rectal Once Willis Modena, MD      ? ?No Known Allergies ? ?Assessment/Plan: ? ?1. Hypertension - BP remains at goal <130/80 mmHg after having stopped irbesartan and hydrochlorothiazide over a month ago due to hypotension and syncope. After accounting for home cuff readings 10 mmHg higher for both systolic and diastolic BP, all home readings are at goal. Suspect reported weight loss has contributed to improved BP. Will continue only metoprolol succinate 50 mg daily. Instructed patient to continue monitoring BP at home  and if she notices consistently elevated readings to call the clinic. If BP elevated in the future, would recommend resuming low dose irbesartan. Follow up with HTN clinic as needed. Next visit with Dr. Clifton James in May.  ? ?Pervis Hocking, PharmD ?PGY2 Ambulatory Care Pharmacy Resident ?12/03/2021 12:07 PM ? ?

## 2021-12-03 ENCOUNTER — Ambulatory Visit (INDEPENDENT_AMBULATORY_CARE_PROVIDER_SITE_OTHER): Payer: Medicare Other | Admitting: Student-PharmD

## 2021-12-03 VITALS — BP 118/72 | HR 74 | Wt 156.6 lb

## 2021-12-03 DIAGNOSIS — I10 Essential (primary) hypertension: Secondary | ICD-10-CM | POA: Diagnosis not present

## 2021-12-03 DIAGNOSIS — M25511 Pain in right shoulder: Secondary | ICD-10-CM | POA: Diagnosis not present

## 2021-12-03 NOTE — Patient Instructions (Signed)
It was nice to see you today! ? ?Your goal blood pressure is less than 130/80 mmHg. In clinic, your blood pressure was 118/72 mmHg. ? ?Medication Changes: ?Continue your current medications! ? ?Monitor blood pressure at home daily and keep a log (on your phone or piece of paper) to bring with you to your next visit. Write down date, time, blood pressure and pulse. ? ?Keep up the good work with diet and exercise. Aim for a diet full of vegetables, fruit and lean meats (chicken, [place], fish). Try to limit salt intake by eating fresh or frozen vegetables (instead of canned), rinse canned vegetables prior to cooking and do not add any additional salt to meals.  ? ? ? ? ? ? ?

## 2021-12-05 DIAGNOSIS — M25511 Pain in right shoulder: Secondary | ICD-10-CM | POA: Diagnosis not present

## 2021-12-10 DIAGNOSIS — M25511 Pain in right shoulder: Secondary | ICD-10-CM | POA: Diagnosis not present

## 2021-12-12 DIAGNOSIS — M25511 Pain in right shoulder: Secondary | ICD-10-CM | POA: Diagnosis not present

## 2021-12-20 DIAGNOSIS — M25511 Pain in right shoulder: Secondary | ICD-10-CM | POA: Diagnosis not present

## 2021-12-25 ENCOUNTER — Ambulatory Visit: Payer: Medicare Other | Admitting: Cardiovascular Disease

## 2021-12-25 ENCOUNTER — Encounter: Payer: Self-pay | Admitting: Cardiovascular Disease

## 2021-12-25 VITALS — BP 138/76 | HR 78 | Ht 62.0 in | Wt 155.6 lb

## 2021-12-25 DIAGNOSIS — I255 Ischemic cardiomyopathy: Secondary | ICD-10-CM | POA: Diagnosis not present

## 2021-12-25 DIAGNOSIS — I10 Essential (primary) hypertension: Secondary | ICD-10-CM

## 2021-12-25 DIAGNOSIS — I2510 Atherosclerotic heart disease of native coronary artery without angina pectoris: Secondary | ICD-10-CM | POA: Diagnosis not present

## 2021-12-25 DIAGNOSIS — E785 Hyperlipidemia, unspecified: Secondary | ICD-10-CM

## 2021-12-25 NOTE — Progress Notes (Signed)
? ?Chief Complaint  ?Patient presents with  ? Follow-up  ?  CAD  ?  ?History of Present Illness: 86 yo female with history of CAD, ischemic cardiomyopathy, HTN, HLD and spinal stenosis here today for cardiac follow up. She was admitted to [facility] in August 2018 with an MI and had a drug eluting stent placed in the LAD. LVEF at the time of her MI was 35-40% but improved to 55-60% on echo here in our office September 2018. Cardiac monitor July 2020 with sinus and PACs. Echo December 2020 with LVEF=60-65%, normal RV function. No valve disease. She has been seen in our HTN clinic. HCTZ added at ED visit on 05/16/21 for hypertensive urgency. ED visit on 09/27/21 with BP 184/79. She then had low blood pressure so she stopped the HCTZ and irbesartan. She was only taking Toprol.  ? ?She is here today for follow up. The patient denies any chest pain, dyspnea, palpitations, lower extremity edema, orthopnea, PND, dizziness, near syncope or syncope. BP has been 244-975 systolic at home.  ? ?Primary Care Physician: Seward Carol, MD ? ?Past Medical History:  ?Diagnosis Date  ? Cholecystitis   ? Coronary artery disease   ? a. 03/2017: 80-90% LAD stenosis (PCI/DES placement with a 2.75x16 mm Promus Premier stent). No significant stenosis along RCA or LCx.   ? DJD (degenerative joint disease)   ? GERD (gastroesophageal reflux disease)   ? Hyperlipidemia   ? Hypertension   ? dx s/p MI   ? Ischemic cardiomyopathy   ? Myocardial infarction ([place]) 03/17/2017  ? 03-2017 treated at [facility]   ? Osteoporosis   ? Status post insertion of drug-eluting stent into left anterior descending (LAD) artery 03/2017  ? ? ?Past Surgical History:  ?Procedure Laterality Date  ? APPENDECTOMY    ? CESAREAN SECTION    ? CHOLECYSTECTOMY N/A 11/05/2017  ? Procedure: LAPAROSCOPIC CHOLECYSTECTOMY WITH INTRAOPERATIVE CHOLANGIOGRAM;  Surgeon: Armandina Gemma, MD;  Location: [facility];  Service: General;  Laterality: N/A;  ? ERCP N/A  11/11/2017  ? Procedure: ENDOSCOPIC RETROGRADE CHOLANGIOPANCREATOGRAPHY (ERCP);  Surgeon: Arta Silence, MD;  Location: [facility];  Service: Endoscopy;  Laterality: N/A;  possible  ? EUS N/A 11/11/2017  ? Procedure: UPPER ENDOSCOPIC ULTRASOUND (EUS) RADIAL;  Surgeon: Arta Silence, MD;  Location: [facility];  Service: Endoscopy;  Laterality: N/A;  ? FEMUR IM NAIL Right 07/13/2019  ? Procedure: INTRAMEDULLARY (IM) RETROGRADE FEMORAL NAILING;  Surgeon: Gaynelle Arabian, MD;  Location: [facility];  Service: Orthopedics;  Laterality: Right;  26mn  ? LUMBAR LAMINECTOMY/ DECOMPRESSION WITH MET-RX    ? SHOULDER ARTHROSCOPY WITH ROTATOR CUFF REPAIR AND SUBACROMIAL DECOMPRESSION Right 09/20/2021  ? Procedure: SHOULDER MINI OPEN ROTATOR CUFF REPAIR AND SUBACROMIAL DECOMPRESSION WITH POSSIBLE PATCH GRAFT;  Surgeon: BSusa Day MD;  Location: [facility];  Service: Orthopedics;  Laterality: Right;  90 MINS  ? stent  Left 03/2017  ? anterior descending ; drug eluting [facility]   ? TONSILLECTOMY    ? VAGINAL HYSTERECTOMY    ? vaginal sling    ? ? ?Current Outpatient Medications  ?Medication Sig Dispense Refill  ? acetaminophen (TYLENOL) 500 MG tablet Take 500-1,000 mg by mouth every 8 (eight) hours as needed for moderate pain or headache.    ? aspirin EC 81 MG tablet Take 81 mg by mouth daily. Swallow whole.    ? atorvastatin (LIPITOR) 80 MG tablet Take 1 tablet (80 mg total)  by mouth daily. 90 tablet 3  ? Calcium Carb-Cholecalciferol (CALCIUM 600/VITAMIN D PO) Take 1 tablet by mouth in the morning and at bedtime.    ? cetirizine (ZYRTEC) 10 MG tablet Take 10 mg by mouth daily as needed for allergies.     ? Cholecalciferol (VITAMIN D3) 2000 units capsule Take 2,000 Units by mouth daily.    ? clotrimazole-betamethasone (LOTRISONE) cream Apply topically 2 (two) times daily. Apply topically 2 (two) times daily.    ? CRANBERRY EXTRACT PO Take 25,000 mg by mouth daily.    ? docusate sodium (COLACE) 100  MG capsule Take 1 capsule (100 mg total) by mouth 2 (two) times daily as needed for mild constipation. 30 capsule 1  ? latanoprost (XALATAN) 0.005 % ophthalmic solution Place 1 drop into both eyes at bedtime.    ? methenamine (HIPREX) 1 g tablet Take 1 g by mouth 2 (two) times daily.    ? metoprolol succinate (TOPROL-XL) 50 MG 24 hr tablet Take 1 tablet (50 mg total) by mouth daily. Needs follow up appointment for further refills. (Patient taking differently: Take 50 mg by mouth every evening. Needs follow up appointment for further refills.) 90 tablet 0  ? mirabegron ER (MYRBETRIQ) 25 MG TB24 tablet Take 25 mg by mouth daily.    ? Multiple Vitamin (MULTI-VITAMINS) TABS Take 1 tablet by mouth daily.    ? nitroGLYCERIN (NITROSTAT) 0.4 MG SL tablet Place 1 tablet (0.4 mg total) under the tongue every 5 (five) minutes as needed for chest pain. 25 tablet 7  ? omeprazole (PRILOSEC) 20 MG capsule Take 20 mg by mouth daily.    ? Polyethyl Glycol-Propyl Glycol (SYSTANE OP) Place 1 drop into both eyes daily as needed (dry eyes).    ? Probiotic Product (TRUNATURE DIGESTIVE PROBIOTIC) CAPS Take 1 capsule by mouth daily.    ? timolol (TIMOPTIC) 0.5 % ophthalmic solution Place 1 drop into the left eye 2 (two) times daily.    ? traZODone (DESYREL) 50 MG tablet Take 50 mg by mouth at bedtime as needed for sleep.    ? ?No current facility-administered medications for this visit.  ? ?Facility-Administered Medications Ordered in Other Visits  ?Medication Dose Route Frequency Provider Last Rate Last Admin  ? indomethacin (INDOCIN) 50 MG suppository 50 mg  50 mg Rectal Once Arta Silence, MD      ? ? ?No Known Allergies ? ?Social History  ? ?Socioeconomic History  ? Marital status: Widowed  ?  Spouse name: Not on file  ? Number of children: 4  ? Years of education: Not on file  ? Highest education level: Not on file  ?Occupational History  ? Occupation: Homemaker  ? Occupation: [employer]   ?Tobacco Use  ?  Smoking status: Former  ?  Packs/day: 1.00  ?  Years: 30.00  ?  Pack years: 30.00  ?  Types: Cigarettes  ? Smokeless tobacco: Never  ? Tobacco comments:  ?  quit 30 years   ?Vaping Use  ? Vaping Use: Never used  ?Substance and Sexual Activity  ? Alcohol use: Yes  ?  Comment: 3 times a year  ? Drug use: Never  ? Sexual activity: Not on file  ?Other Topics Concern  ? Not on file  ?Social History Narrative  ? Not on file  ? ?Social Determinants of Health  ? ?Financial Resource Strain: Not on file  ?Food Insecurity: Not on file  ?Transportation Needs: Not on file  ?Physical Activity:  Not on file  ?Stress: Not on file  ?Social Connections: Not on file  ?Intimate Partner Violence: Not on file  ? ? ?Family History  ?Problem Relation Age of Onset  ? Heart disease Father   ? Breast cancer Sister   ? Pancreatic cancer Sister   ? ? ?Review of Systems:  As stated in the HPI and otherwise negative.  ? ?BP 138/76   Pulse 78   Ht _0  (1.575 m)   Wt 155 lb 9.6 oz (70.6 kg)   SpO2 93%   BMI 28.46 kg/m?  ? ?Physical Examination: ? ?General: Well developed, well nourished, NAD  ?HEENT: OP clear, mucus membranes moist  ?SKIN: warm, dry. No rashes. ?Neuro: No focal deficits  ?Musculoskeletal: Muscle strength 5/5 all ext  ?Psychiatric: Mood and affect normal  ?Neck: No JVD, no carotid bruits, no thyromegaly, no lymphadenopathy.  ?Lungs:Clear bilaterally, no wheezes, rhonci, crackles ?Cardiovascular: Regular rate and rhythm. No murmurs, gallops or rubs. ?Abdomen:Soft. Bowel sounds present. Non-tender.  ?Extremities: No lower extremity edema. Pulses are 2 + in the bilateral DP/PT. ? ?EKG:  EKG is ordered today. ?The ekg ordered today demonstrates  ? ?Echo December 2020: ? 1. Left ventricular ejection fraction, by visual estimation, is 60 to  ?65%. The left ventricle has normal function. There is no left ventricular  ?hypertrophy.  ? 2. Global right ventricle has normal systolic function.The right  ?ventricular size is mildly  enlarged. No increase in right ventricular wall  ?thickness.  ? 3. Left atrial size was normal.  ? 4. Right atrial size was normal.  ? 5. Mild mitral annular calcification.  ? 6. The mitral valve is normal in structu

## 2021-12-25 NOTE — Patient Instructions (Signed)
Medication Instructions:  No changes *If you need a refill on your cardiac medications before your next appointment, please call your pharmacy*   Lab Work: none If you have labs (blood work) drawn today and your tests are completely normal, you will receive your results only by: MyChart Message (if you have MyChart) OR A paper copy in the mail If you have any lab test that is abnormal or we need to change your treatment, we will call you to review the results.   Testing/Procedures: none   Follow-Up: At [facility], you and your health needs are our priority.  As part of our continuing mission to provide you with exceptional heart care, we have created designated Provider Care Teams.  These Care Teams include your primary Cardiologist (physician) and Advanced Practice Providers (APPs -  Physician Assistants and Nurse Practitioners) who all work together to provide you with the care you need, when you need it.  We recommend signing up for the patient portal called "MyChart".  Sign up information is provided on this After Visit Summary.  MyChart is used to connect with patients for Virtual Visits (Telemedicine).  Patients are able to view lab/test results, encounter notes, upcoming appointments, etc.  Non-urgent messages can be sent to your provider as well.   To learn more about what you can do with MyChart, go to [email or web address].    Your next appointment:   12 month(s)  The format for your next appointment:   In Person  Provider:   Christopher McAlhany, MD     Important Information About Sugar       

## 2021-12-26 DIAGNOSIS — M25511 Pain in right shoulder: Secondary | ICD-10-CM | POA: Diagnosis not present

## 2021-12-27 DIAGNOSIS — M25511 Pain in right shoulder: Secondary | ICD-10-CM | POA: Diagnosis not present

## 2022-01-21 DIAGNOSIS — M5136 Other intervertebral disc degeneration, lumbar region: Secondary | ICD-10-CM | POA: Diagnosis not present

## 2022-01-21 DIAGNOSIS — M5451 Vertebrogenic low back pain: Secondary | ICD-10-CM | POA: Diagnosis not present

## 2022-01-23 DIAGNOSIS — M5451 Vertebrogenic low back pain: Secondary | ICD-10-CM | POA: Diagnosis not present

## 2022-01-28 DIAGNOSIS — M5451 Vertebrogenic low back pain: Secondary | ICD-10-CM | POA: Diagnosis not present

## 2022-02-13 DIAGNOSIS — M5451 Vertebrogenic low back pain: Secondary | ICD-10-CM | POA: Diagnosis not present

## 2022-02-18 DIAGNOSIS — M5451 Vertebrogenic low back pain: Secondary | ICD-10-CM | POA: Diagnosis not present

## 2022-02-19 DIAGNOSIS — H43813 Vitreous degeneration, bilateral: Secondary | ICD-10-CM | POA: Diagnosis not present

## 2022-02-19 DIAGNOSIS — H04123 Dry eye syndrome of bilateral lacrimal glands: Secondary | ICD-10-CM | POA: Diagnosis not present

## 2022-02-19 DIAGNOSIS — H401122 Primary open-angle glaucoma, left eye, moderate stage: Secondary | ICD-10-CM | POA: Diagnosis not present

## 2022-02-19 DIAGNOSIS — H524 Presbyopia: Secondary | ICD-10-CM | POA: Diagnosis not present

## 2022-02-25 DIAGNOSIS — M5451 Vertebrogenic low back pain: Secondary | ICD-10-CM | POA: Diagnosis not present

## 2022-03-06 DIAGNOSIS — R159 Full incontinence of feces: Secondary | ICD-10-CM | POA: Diagnosis not present

## 2022-03-06 DIAGNOSIS — N390 Urinary tract infection, site not specified: Secondary | ICD-10-CM | POA: Diagnosis not present

## 2022-03-06 DIAGNOSIS — N3281 Overactive bladder: Secondary | ICD-10-CM | POA: Diagnosis not present

## 2022-03-24 DIAGNOSIS — E7800 Pure hypercholesterolemia, unspecified: Secondary | ICD-10-CM | POA: Diagnosis not present

## 2022-03-24 DIAGNOSIS — G4700 Insomnia, unspecified: Secondary | ICD-10-CM | POA: Diagnosis not present

## 2022-03-24 DIAGNOSIS — I10 Essential (primary) hypertension: Secondary | ICD-10-CM | POA: Diagnosis not present

## 2022-04-16 DIAGNOSIS — I2510 Atherosclerotic heart disease of native coronary artery without angina pectoris: Secondary | ICD-10-CM | POA: Diagnosis not present

## 2022-04-16 DIAGNOSIS — I10 Essential (primary) hypertension: Secondary | ICD-10-CM | POA: Diagnosis not present

## 2022-04-16 DIAGNOSIS — E7800 Pure hypercholesterolemia, unspecified: Secondary | ICD-10-CM | POA: Diagnosis not present

## 2022-04-16 DIAGNOSIS — I700 Atherosclerosis of aorta: Secondary | ICD-10-CM | POA: Diagnosis not present

## 2022-05-12 ENCOUNTER — Inpatient Hospital Stay (HOSPITAL_COMMUNITY): Payer: Medicare Other

## 2022-05-12 ENCOUNTER — Inpatient Hospital Stay (HOSPITAL_COMMUNITY)
Admission: EM | Admit: 2022-05-12 | Discharge: 2022-05-21 | DRG: 483 | Disposition: A | Discharge status: Skilled Nursing Facility | Payer: Medicare Other | Attending: Internal Medicine | Admitting: Internal Medicine

## 2022-05-12 ENCOUNTER — Encounter (HOSPITAL_COMMUNITY): Payer: Self-pay

## 2022-05-12 ENCOUNTER — Emergency Department (HOSPITAL_COMMUNITY): Payer: Medicare Other

## 2022-05-12 ENCOUNTER — Other Ambulatory Visit: Payer: Self-pay

## 2022-05-12 DIAGNOSIS — R531 Weakness: Secondary | ICD-10-CM | POA: Diagnosis not present

## 2022-05-12 DIAGNOSIS — S42201A Unspecified fracture of upper end of right humerus, initial encounter for closed fracture: Secondary | ICD-10-CM | POA: Diagnosis not present

## 2022-05-12 DIAGNOSIS — M1990 Unspecified osteoarthritis, unspecified site: Secondary | ICD-10-CM | POA: Diagnosis present

## 2022-05-12 DIAGNOSIS — S300XXA Contusion of lower back and pelvis, initial encounter: Secondary | ICD-10-CM | POA: Diagnosis not present

## 2022-05-12 DIAGNOSIS — Z9049 Acquired absence of other specified parts of digestive tract: Secondary | ICD-10-CM

## 2022-05-12 DIAGNOSIS — M810 Age-related osteoporosis without current pathological fracture: Secondary | ICD-10-CM | POA: Diagnosis not present

## 2022-05-12 DIAGNOSIS — M85811 Other specified disorders of bone density and structure, right shoulder: Secondary | ICD-10-CM | POA: Diagnosis not present

## 2022-05-12 DIAGNOSIS — W19XXXA Unspecified fall, initial encounter: Secondary | ICD-10-CM | POA: Diagnosis not present

## 2022-05-12 DIAGNOSIS — S32591A Other specified fracture of right pubis, initial encounter for closed fracture: Secondary | ICD-10-CM | POA: Diagnosis not present

## 2022-05-12 DIAGNOSIS — Z9889 Other specified postprocedural states: Secondary | ICD-10-CM | POA: Diagnosis not present

## 2022-05-12 DIAGNOSIS — S329XXA Fracture of unspecified parts of lumbosacral spine and pelvis, initial encounter for closed fracture: Secondary | ICD-10-CM | POA: Diagnosis present

## 2022-05-12 DIAGNOSIS — G9389 Other specified disorders of brain: Secondary | ICD-10-CM | POA: Diagnosis not present

## 2022-05-12 DIAGNOSIS — S32441A Displaced fracture of posterior column [ilioischial] of right acetabulum, initial encounter for closed fracture: Secondary | ICD-10-CM | POA: Diagnosis present

## 2022-05-12 DIAGNOSIS — S32401A Unspecified fracture of right acetabulum, initial encounter for closed fracture: Secondary | ICD-10-CM | POA: Diagnosis not present

## 2022-05-12 DIAGNOSIS — Z23 Encounter for immunization: Secondary | ICD-10-CM

## 2022-05-12 DIAGNOSIS — S42291A Other displaced fracture of upper end of right humerus, initial encounter for closed fracture: Secondary | ICD-10-CM | POA: Diagnosis present

## 2022-05-12 DIAGNOSIS — R2681 Unsteadiness on feet: Secondary | ICD-10-CM | POA: Diagnosis not present

## 2022-05-12 DIAGNOSIS — M4800 Spinal stenosis, site unspecified: Secondary | ICD-10-CM | POA: Diagnosis present

## 2022-05-12 DIAGNOSIS — Z01810 Encounter for preprocedural cardiovascular examination: Secondary | ICD-10-CM | POA: Diagnosis not present

## 2022-05-12 DIAGNOSIS — T148XXA Other injury of unspecified body region, initial encounter: Secondary | ICD-10-CM | POA: Diagnosis present

## 2022-05-12 DIAGNOSIS — R262 Difficulty in walking, not elsewhere classified: Secondary | ICD-10-CM | POA: Diagnosis not present

## 2022-05-12 DIAGNOSIS — Y92481 Parking lot as the place of occurrence of the external cause: Secondary | ICD-10-CM | POA: Diagnosis not present

## 2022-05-12 DIAGNOSIS — E785 Hyperlipidemia, unspecified: Secondary | ICD-10-CM | POA: Diagnosis not present

## 2022-05-12 DIAGNOSIS — E538 Deficiency of other specified B group vitamins: Secondary | ICD-10-CM | POA: Diagnosis present

## 2022-05-12 DIAGNOSIS — Z803 Family history of malignant neoplasm of breast: Secondary | ICD-10-CM

## 2022-05-12 DIAGNOSIS — K219 Gastro-esophageal reflux disease without esophagitis: Secondary | ICD-10-CM | POA: Diagnosis present

## 2022-05-12 DIAGNOSIS — Z98891 History of uterine scar from previous surgery: Secondary | ICD-10-CM

## 2022-05-12 DIAGNOSIS — R778 Other specified abnormalities of plasma proteins: Secondary | ICD-10-CM | POA: Diagnosis not present

## 2022-05-12 DIAGNOSIS — R7989 Other specified abnormal findings of blood chemistry: Secondary | ICD-10-CM | POA: Diagnosis present

## 2022-05-12 DIAGNOSIS — R41841 Cognitive communication deficit: Secondary | ICD-10-CM | POA: Diagnosis not present

## 2022-05-12 DIAGNOSIS — M67813 Other specified disorders of tendon, right shoulder: Secondary | ICD-10-CM | POA: Diagnosis present

## 2022-05-12 DIAGNOSIS — S42211A Unspecified displaced fracture of surgical neck of right humerus, initial encounter for closed fracture: Secondary | ICD-10-CM | POA: Diagnosis not present

## 2022-05-12 DIAGNOSIS — S72001A Fracture of unspecified part of neck of right femur, initial encounter for closed fracture: Secondary | ICD-10-CM | POA: Diagnosis not present

## 2022-05-12 DIAGNOSIS — M6281 Muscle weakness (generalized): Secondary | ICD-10-CM | POA: Diagnosis not present

## 2022-05-12 DIAGNOSIS — Z8781 Personal history of (healed) traumatic fracture: Secondary | ICD-10-CM | POA: Diagnosis not present

## 2022-05-12 DIAGNOSIS — R339 Retention of urine, unspecified: Secondary | ICD-10-CM | POA: Diagnosis not present

## 2022-05-12 DIAGNOSIS — Z471 Aftercare following joint replacement surgery: Secondary | ICD-10-CM | POA: Diagnosis not present

## 2022-05-12 DIAGNOSIS — Z90710 Acquired absence of both cervix and uterus: Secondary | ICD-10-CM

## 2022-05-12 DIAGNOSIS — Z602 Problems related to living alone: Secondary | ICD-10-CM | POA: Diagnosis present

## 2022-05-12 DIAGNOSIS — Z8249 Family history of ischemic heart disease and other diseases of the circulatory system: Secondary | ICD-10-CM | POA: Diagnosis not present

## 2022-05-12 DIAGNOSIS — I3481 Nonrheumatic mitral (valve) annulus calcification: Secondary | ICD-10-CM | POA: Diagnosis present

## 2022-05-12 DIAGNOSIS — Z96611 Presence of right artificial shoulder joint: Secondary | ICD-10-CM | POA: Diagnosis not present

## 2022-05-12 DIAGNOSIS — S42241A 4-part fracture of surgical neck of right humerus, initial encounter for closed fracture: Secondary | ICD-10-CM | POA: Diagnosis present

## 2022-05-12 DIAGNOSIS — Z7401 Bed confinement status: Secondary | ICD-10-CM | POA: Diagnosis not present

## 2022-05-12 DIAGNOSIS — Z87891 Personal history of nicotine dependence: Secondary | ICD-10-CM

## 2022-05-12 DIAGNOSIS — S32511A Fracture of superior rim of right pubis, initial encounter for closed fracture: Secondary | ICD-10-CM | POA: Diagnosis not present

## 2022-05-12 DIAGNOSIS — I10 Essential (primary) hypertension: Secondary | ICD-10-CM | POA: Diagnosis not present

## 2022-05-12 DIAGNOSIS — S199XXA Unspecified injury of neck, initial encounter: Secondary | ICD-10-CM | POA: Diagnosis not present

## 2022-05-12 DIAGNOSIS — S32431A Displaced fracture of anterior column [iliopubic] of right acetabulum, initial encounter for closed fracture: Principal | ICD-10-CM | POA: Diagnosis present

## 2022-05-12 DIAGNOSIS — I2510 Atherosclerotic heart disease of native coronary artery without angina pectoris: Secondary | ICD-10-CM | POA: Diagnosis not present

## 2022-05-12 DIAGNOSIS — Z7982 Long term (current) use of aspirin: Secondary | ICD-10-CM

## 2022-05-12 DIAGNOSIS — R9431 Abnormal electrocardiogram [ECG] [EKG]: Secondary | ICD-10-CM | POA: Diagnosis not present

## 2022-05-12 DIAGNOSIS — I252 Old myocardial infarction: Secondary | ICD-10-CM

## 2022-05-12 DIAGNOSIS — S79911A Unspecified injury of right hip, initial encounter: Secondary | ICD-10-CM | POA: Diagnosis not present

## 2022-05-12 DIAGNOSIS — D62 Acute posthemorrhagic anemia: Secondary | ICD-10-CM | POA: Diagnosis present

## 2022-05-12 DIAGNOSIS — S0990XA Unspecified injury of head, initial encounter: Secondary | ICD-10-CM | POA: Diagnosis not present

## 2022-05-12 DIAGNOSIS — I255 Ischemic cardiomyopathy: Secondary | ICD-10-CM | POA: Diagnosis present

## 2022-05-12 DIAGNOSIS — W010XXA Fall on same level from slipping, tripping and stumbling without subsequent striking against object, initial encounter: Secondary | ICD-10-CM | POA: Diagnosis not present

## 2022-05-12 DIAGNOSIS — M25511 Pain in right shoulder: Secondary | ICD-10-CM | POA: Diagnosis not present

## 2022-05-12 DIAGNOSIS — M7989 Other specified soft tissue disorders: Secondary | ICD-10-CM | POA: Diagnosis not present

## 2022-05-12 DIAGNOSIS — E782 Mixed hyperlipidemia: Secondary | ICD-10-CM | POA: Diagnosis not present

## 2022-05-12 DIAGNOSIS — S32401D Unspecified fracture of right acetabulum, subsequent encounter for fracture with routine healing: Secondary | ICD-10-CM | POA: Diagnosis not present

## 2022-05-12 DIAGNOSIS — Z743 Need for continuous supervision: Secondary | ICD-10-CM | POA: Diagnosis not present

## 2022-05-12 DIAGNOSIS — Z800 Family history of malignant neoplasm of digestive organs: Secondary | ICD-10-CM

## 2022-05-12 DIAGNOSIS — S32301A Unspecified fracture of right ilium, initial encounter for closed fracture: Secondary | ICD-10-CM | POA: Diagnosis not present

## 2022-05-12 DIAGNOSIS — Z955 Presence of coronary angioplasty implant and graft: Secondary | ICD-10-CM

## 2022-05-12 DIAGNOSIS — S32501A Unspecified fracture of right pubis, initial encounter for closed fracture: Secondary | ICD-10-CM | POA: Diagnosis not present

## 2022-05-12 DIAGNOSIS — R338 Other retention of urine: Secondary | ICD-10-CM | POA: Diagnosis not present

## 2022-05-12 DIAGNOSIS — Z79899 Other long term (current) drug therapy: Secondary | ICD-10-CM

## 2022-05-12 LAB — BASIC METABOLIC PANEL
Anion gap: 9 (ref 5–15)
BUN: 12 mg/dL (ref 8–23)
CO2: 27 mmol/L (ref 22–32)
Calcium: 9.1 mg/dL (ref 8.9–10.3)
Chloride: 103 mmol/L (ref 98–111)
Creatinine, Ser: 0.88 mg/dL (ref 0.44–1.00)
GFR, Estimated: >60 mL/min (ref >60)
Glucose, Bld: 145 mg/dL — ABNORMAL HIGH (ref 70–99)
Potassium: 3.6 mmol/L (ref 3.5–5.1)
Sodium: 139 mmol/L (ref 135–145)

## 2022-05-12 LAB — HEPATIC FUNCTION PANEL
ALT: 19 U/L (ref 0–44)
AST: 29 U/L (ref 15–41)
Albumin: 3.8 g/dL (ref 3.5–5.0)
Alkaline Phosphatase: 46 U/L (ref 38–126)
Bilirubin, Direct: 0.2 mg/dL (ref 0.0–0.2)
Indirect Bilirubin: 0.8 mg/dL (ref 0.3–0.9)
Total Bilirubin: 1 mg/dL (ref 0.3–1.2)
Total Protein: 6.3 g/dL — ABNORMAL LOW (ref 6.5–8.1)

## 2022-05-12 LAB — CBC WITH DIFFERENTIAL/PLATELET
Abs Immature Granulocytes: 0.11 10*3/uL — ABNORMAL HIGH (ref 0.00–0.07)
Basophils Absolute: 0.1 10*3/uL (ref 0.0–0.1)
Basophils Relative: 0 %
Eosinophils Absolute: 0 10*3/uL (ref 0.0–0.5)
Eosinophils Relative: 0 %
HCT: 30.1 % — ABNORMAL LOW (ref 36.0–46.0)
Hemoglobin: 10.4 g/dL — ABNORMAL LOW (ref 12.0–15.0)
Immature Granulocytes: 1 %
Lymphocytes Relative: 8 %
Lymphs Abs: 1.3 10*3/uL (ref 0.7–4.0)
MCH: 30.6 pg (ref 26.0–34.0)
MCHC: 34.6 g/dL (ref 30.0–36.0)
MCV: 88.5 fL (ref 80.0–100.0)
Monocytes Absolute: 1 10*3/uL (ref 0.1–1.0)
Monocytes Relative: 6 %
Neutro Abs: 13.7 10*3/uL — ABNORMAL HIGH (ref 1.7–7.7)
Neutrophils Relative %: 85 %
Platelets: 159 10*3/uL (ref 150–400)
RBC: 3.4 MIL/uL — ABNORMAL LOW (ref 3.87–5.11)
RDW: 12 % (ref 11.5–15.5)
WBC: 16.2 10*3/uL — ABNORMAL HIGH (ref 4.0–10.5)
nRBC: 0 % (ref 0.0–0.2)

## 2022-05-12 LAB — TSH: TSH: 2.501 u[IU]/mL (ref 0.350–4.500)

## 2022-05-12 LAB — PHOSPHORUS: Phosphorus: 3.7 mg/dL (ref 2.5–4.6)

## 2022-05-12 LAB — MAGNESIUM: Magnesium: 1.6 mg/dL — ABNORMAL LOW (ref 1.7–2.4)

## 2022-05-12 LAB — TROPONIN I (HIGH SENSITIVITY): Troponin I (High Sensitivity): 243 ng/L (ref <18)

## 2022-05-12 LAB — CK: Total CK: 145 U/L (ref 38–234)

## 2022-05-12 MED ORDER — MORPHINE SULFATE (PF) 4 MG/ML IV SOLN
4.0000 mg | Freq: Once | INTRAVENOUS | Status: AC
  Administered 2022-05-12: 4 mg via INTRAVENOUS
  Filled 2022-05-12: qty 1

## 2022-05-12 MED ORDER — SODIUM CHLORIDE 0.9 % IV SOLN: INTRAVENOUS | Status: DC

## 2022-05-12 MED ORDER — MAGNESIUM SULFATE IN D5W 1-5 GM/100ML-% IV SOLN
1.0000 g | Freq: Once | INTRAVENOUS | Status: AC
  Administered 2022-05-12: 1 g via INTRAVENOUS
  Filled 2022-05-12: qty 100

## 2022-05-12 MED ORDER — INFLUENZA VAC A&B SA ADJ QUAD 0.5 ML IM PRSY
0.5000 mL | PREFILLED_SYRINGE | INTRAMUSCULAR | Status: AC
  Administered 2022-05-13: 0.5 mL via INTRAMUSCULAR
  Filled 2022-05-12: qty 0.5

## 2022-05-12 MED ORDER — MORPHINE SULFATE (PF) 2 MG/ML IV SOLN
2.0000 mg | INTRAVENOUS | Status: DC | PRN
  Administered 2022-05-13 (×2): 4 mg via INTRAVENOUS
  Administered 2022-05-13: 2 mg via INTRAVENOUS
  Filled 2022-05-12 (×3): qty 2

## 2022-05-12 NOTE — ED Notes (Signed)
[facility] called. 

## 2022-05-12 NOTE — Assessment & Plan Note (Signed)
Given somewhat soft blood pressures allow permissive hypertension for tonight restart metoprolol as able

## 2022-05-12 NOTE — Assessment & Plan Note (Signed)
Check vitamin D level 

## 2022-05-12 NOTE — Assessment & Plan Note (Signed)
Continue Lipitor 80 mg p.o. daily 

## 2022-05-12 NOTE — Assessment & Plan Note (Signed)
In the setting of severe pain  no CP, no SOB no change on ECG  continue to cycle and obtain echo  cardiology consulted for preop clearance

## 2022-05-12 NOTE — Assessment & Plan Note (Signed)
May need surgical intervention all orthopedics is aware will see patient in consult. Given patient will likely need to undergo surgical intervention and has extensive history of coronary artery diseasewill notify cardiology  for preop clearance

## 2022-05-12 NOTE — ED Triage Notes (Signed)
BIBA s/p mechanical fall in parking lot.  Denies hitting head. Denies back and neck pain, LOC, blood thinner usage Pain to right shoulder and right hip

## 2022-05-12 NOTE — Assessment & Plan Note (Addendum)
Hold off on aspirin given hematoma Stable continue Lipitor 80mg  po qday

## 2022-05-12 NOTE — H&P (Signed)
Carrie Barnes TUU:[id number] DOB: July 16, 1936 DOA: 05/12/2022     PCP: Seward Carol, MD   Outpatient Specialists:  CARDS:  Dr.McAlhany   Ortho Dr. Maxie Better Patient arrived to ER on 05/12/22 at 1614 Referred by Attending Malvin Johns, MD   Patient coming from:    home Lives alone,        Chief Complaint:   Chief Complaint  Patient presents with   Fall    HPI: Carrie Barnes is a 86 y.o. female with medical history significant of CAD last cardiac stenting in 2018, GERD, HLD, HTN    Presented with    fall and right hip pain  Had a mechanical fall in the parking lot did not hit her head no C not on blood thinners.  Noted to have pain in her right shoulder and right hip. Patient does take baby aspirin Patient has known CAD was admitted to [facility] in August 2018 and had MI with drug-eluting stent placed in LAD initially she did have evidence of CHF but her EF has improved since then and she has been followed by cardiology At baseline walks independently        Regarding pertinent Chronic problems:     Hyperlipidemia -  on statins Lipitor (atorvastatin)  Lipid Panel     Component Value Date/Time   CHOL 120 07/09/2017 0850   TRIG 207 (H) 07/09/2017 0850   HDL 42 07/09/2017 0850   CHOLHDL 2.9 07/09/2017 0850   LDLCALC 37 07/09/2017 0850   LABVLDL 41 (H) 07/09/2017 0850     HTN on toprol   chronic CHF diastolic/systolic/ combined - last echo 2020 showing improvement EF 60-65%    CAD  - On Aspirin, statin, betablocker,                 -  followed by cardiology                - last cardiac cath 2018     Chronic anemia - baseline hg Hemoglobin & Hematocrit  Recent Labs    05/16/21 0454 09/13/21 0836 05/12/22 2055  HGB 11.1* 11.6* 10.4*     While in ER:     Ordered Comminuted fracture is seen in the neck of right humerus. Possible anterior subluxation/dislocation in the right shoulder.    Hip Previous internal fixation of  fracture of proximal right femur. Deformities in the right iliac crest and right inferior pubic ramus suggest possible recent fractures. If clinically warranted, follow-up CT may be considered.    CXR -  NON acute  CT pelvis -  Acute comminuted fracture of the right acetabulum with extension into the iliac crest. 2. Small right pelvic sidewall hematoma. 3. Right inferior pubic ramus fracture with adjacent intramuscular hematomas involving the right obturator externus and adductor magnus muscles.  CT right shoulder 1. Acute fracture of the right humeral head and neck, as described above. 2. Enlargement of the anterior deltoid muscle adjacent to the fracture site, likely representing a component of intramuscular hemorrhage.  Following Medications were ordered in ER: Medications  influenza vaccine adjuvanted (FLUAD) injection 0.5 mL (has no administration in time range)  0.9 %  sodium chloride infusion ( Intravenous New Bag/Given 05/12/22 2138)  morphine (PF) 4 MG/ML injection 4 mg (4 mg Intravenous Given 05/12/22 1737)  morphine (PF) 4 MG/ML injection 4 mg (4 mg Intravenous Given 05/12/22 1838)  morphine (PF) 4 MG/ML injection 4 mg (4 mg Intravenous Given 05/12/22  2131)    _______________________________________________________ ER Provider Called:   orthopedics  Dr. Alvan Dame [facility] They Recommend admit to medicine   Will see in AM      ED Triage Vitals [05/12/22 1627]  Enc Vitals Group     BP (!) 117/106     Pulse Rate 89     Resp 18     Temp 98.7 F (37.1 C)     Temp Source Oral     SpO2 98 %     Weight 154 lb 5.2 oz (70 kg)     Height '5\' 2"'  (1.575 m)     Head Circumference      Peak Flow      Pain Score 8     Pain Loc      Pain Edu?      Excl. in [place]?   ONGE(95)@     _________________________________________ Significant initial  Findings: Abnormal Labs Reviewed  BASIC METABOLIC PANEL - Abnormal; Notable for the following components:      Result Value   Glucose, Bld  145 (*)    All other components within normal limits  CBC WITH DIFFERENTIAL/PLATELET - Abnormal; Notable for the following components:   WBC 16.2 (*)    RBC 3.40 (*)    Hemoglobin 10.4 (*)    HCT 30.1 (*)    Neutro Abs 13.7 (*)    Abs Immature Granulocytes 0.11 (*)    All other components within normal limits    _________________________ Troponin  246  ECG: Ordered Personally reviewed and interpreted by me showing: HR : 95 Rhythm:Sinus rhythm since last tracing no significant change QTC 457    The recent clinical data is shown below. Vitals:   05/12/22 1926 05/12/22 2026 05/12/22 2100 05/12/22 2200  BP: (!) 146/64 137/74 131/65 (!) 140/69  Pulse: 93 97 99 100  Resp: '18 14 18 14  ' Temp: 99.3 F (37.4 C)     TempSrc: Oral     SpO2: 95% 94% 99% 98%  Weight:      Height:        WBC     Component Value Date/Time   WBC 16.2 (H) 05/12/2022 2055   LYMPHSABS 1.3 05/12/2022 2055   MONOABS 1.0 05/12/2022 2055   EOSABS 0.0 05/12/2022 2055   BASOSABS 0.1 05/12/2022 2055      _______________________________________________ Hospitalist was called for admission for   Fall, initial encounter    Closed displaced fracture of right acetabulum, unspecified portion of acetabulum, initial encounter ([place])    Closed fracture of proximal end of right humerus, unspecified fracture morphology, initial encounter    The following Work up has been ordered so far:  Orders Placed This Encounter  Procedures   DG Shoulder Right   DG Hip Unilat W or Wo Pelvis 2-3 Views Right   CT PELVIS WO CONTRAST   CT Shoulder Right Wo Contrast   Basic metabolic panel   CBC with Differential   Urinalysis, Routine w reflex microscopic Urine, Catheterized   Diet NPO time specified   Insert urethral catheter Keep foley catheter in and record volume at insertion and collect UA   Consult to orthopedic surgery   Consult to hospitalist   ED EKG   EKG 12-Lead     OTHER Significant initial   Findings:  labs showing:  Recent Labs  Lab 05/12/22 2055  NA 139  K 3.6  CO2 27  GLUCOSE 145*  BUN 12  CREATININE 0.88  CALCIUM 9.1  Cr    stable,    Lab Results  Component Value Date   CREATININE 0.88 05/12/2022   CREATININE 0.81 09/21/2021   CREATININE 0.87 09/13/2021    No results for input(s): "AST", "ALT", "ALKPHOS", "BILITOT", "PROT", "ALBUMIN" in the last 168 hours. Lab Results  Component Value Date   CALCIUM 9.1 05/12/2022    Plt: Lab Results  Component Value Date   PLT 159 05/12/2022       Recent Labs  Lab 05/12/22 2055  WBC 16.2*  NEUTROABS 13.7*  HGB 10.4*  HCT 30.1*  MCV 88.5  PLT 159    HG/HCT     Down   from baseline see below    Component Value Date/Time   HGB 10.4 (L) 05/12/2022 2055   HCT 30.1 (L) 05/12/2022 2055   MCV 88.5 05/12/2022 2055       Cardiac Panel (last 3 results) No results for input(s): "CKTOTAL", "CKMB", "TROPONINI", "RELINDX" in the last 72 hours.  .car BNP (last 3 results) No results for input(s): "BNP" in the last 8760 hours.     Cultures: No results found for: "SDES", "SPECREQUEST", "CULT", "REPTSTATUS"   Radiological Exams on Admission: CT Shoulder Right Wo Contrast  Result Date: 05/12/2022 CLINICAL DATA:  Shoulder trauma, fracture of humerus or scapula EXAM: CT OF THE UPPER RIGHT EXTREMITY WITHOUT CONTRAST TECHNIQUE: Multidetector CT imaging of the upper right extremity was performed according to the standard protocol. RADIATION DOSE REDUCTION: This exam was performed according to the departmental dose-optimization program which includes automated exposure control, adjustment of the mA and/or kV according to patient size and/or use of iterative reconstruction technique. COMPARISON:  Same-day x-ray FINDINGS: Bones/Joint/Cartilage Acute fracture of the right humeral head and neck with transversely oriented component through the surgical neck. Humeral neck fracture impacted by approximately 2 cm. There is  approximately 1.4 cm of anterior displacement. Nondisplaced fracture component involves the greater and lesser tuberosities. There appears to be disruption of the humeral head articular surface inferiorly. Glenohumeral joint alignment maintained without dislocation. Remaining visualized osseous structures are otherwise intact. Normal alignment at the [facility] joint. Ligaments Suboptimally assessed by CT. Muscles and Tendons Enlargement of the anterior deltoid muscle adjacent to the fracture site, likely representing a component of intramuscular hemorrhage. Atrophy of the infraspinatus muscle. Soft tissues Generalized soft tissue swelling about the shoulder. No right axillary lymphadenopathy. Aortic atherosclerosis. Included right lung field is clear. IMPRESSION: 1. Acute fracture of the right humeral head and neck, as described above. 2. Enlargement of the anterior deltoid muscle adjacent to the fracture site, likely representing a component of intramuscular hemorrhage. Aortic Atherosclerosis (ICD10-I70.0). Electronically Signed   By: Davina Poke D.O.   On: 05/12/2022 19:21   CT PELVIS WO CONTRAST  Result Date: 05/12/2022 CLINICAL DATA:  Hip trauma, fracture suspected, xray done EXAM: CT PELVIS WITHOUT CONTRAST TECHNIQUE: Multidetector CT imaging of the pelvis was performed following the standard protocol without intravenous contrast. RADIATION DOSE REDUCTION: This exam was performed according to the departmental dose-optimization program which includes automated exposure control, adjustment of the mA and/or kV according to patient size and/or use of iterative reconstruction technique. COMPARISON:  03/02/2017 FINDINGS: Urinary Tract:  No abnormality visualized. Bowel: Sigmoid diverticulosis. No active inflammation or evidence of bowel obstruction within the pelvis. Vascular/Lymphatic: Aortoiliac atherosclerosis. Vascular findings are not well assessed on noncontrast imaging. Reproductive:  No mass or other  significant abnormality Other:  Small right pelvic sidewall hematoma. Musculoskeletal: Acute comminuted fracture of the right acetabulum with extension into  the iliac crest. Acetabular fracture involves both the anterior and posterior columns. Femoroacetabular joint alignment is maintained without dislocation. Comminuted, displaced and angulated fracture of the mid right inferior pubic ramus. Fullness within the right obturator externus and adductor magnus muscles adjacent to the right inferior pubic ramus likely reflecting a component of intramuscular hematoma. No pubic diastasis. Sacroiliac joints intact without diastasis. Prior ORIF of the proximal right femur. No hardware complication or femur fracture. IMPRESSION: 1. Acute comminuted fracture of the right acetabulum with extension into the iliac crest. 2. Small right pelvic sidewall hematoma. 3. Right inferior pubic ramus fracture with adjacent intramuscular hematomas involving the right obturator externus and adductor magnus muscles. 4. Aortic atherosclerosis (ICD10-I70.0). Electronically Signed   By: Davina Poke D.O.   On: 05/12/2022 18:32   DG Hip Unilat W or Wo Pelvis 2-3 Views Right  Result Date: 05/12/2022 CLINICAL DATA:  Trauma, fall EXAM: DG HIP (WITH OR WITHOUT PELVIS) 2-3V RIGHT COMPARISON:  07/11/2019 FINDINGS: There is previous internal fixation of intertrochanteric fracture of right femur with intramedullary rod. There is deformity in right inferior pubic ramus suggesting recent or old fracture. There is offset in alignment in the cortical margin in the right iliac crest suggesting possible fracture. IMPRESSION: Previous internal fixation of fracture of proximal right femur. Deformities in the right iliac crest and right inferior pubic ramus suggest possible recent fractures. If clinically warranted, follow-up CT may be considered. Electronically Signed   By: Elmer Picker M.D.   On: 05/12/2022 17:19   DG Shoulder Right  Result  Date: 05/12/2022 CLINICAL DATA:  Trauma, fall EXAM: RIGHT SHOULDER - 2+ VIEW COMPARISON:  07/14/2019 FINDINGS: There is severely comminuted fracture in the neck cough right humerus. There is superior displacement of shaft of humerus in relation to the head. In the scapular Y-view, humeral head is projecting slightly more anterior than usual. IMPRESSION: Comminuted fracture is seen in the neck of right humerus. Possible anterior subluxation/dislocation in the right shoulder. Electronically Signed   By: Elmer Picker M.D.   On: 05/12/2022 17:15   _______________________________________________________________________________________________________ Latest  Blood pressure (!) 140/69, pulse 100, temperature 99.3 F (37.4 C), temperature source Oral, resp. rate 14, height '5\' 2"'  (1.575 m), weight 70 kg, SpO2 98 %.   Vitals  labs and radiology finding personally reviewed  Review of Systems:    Pertinent positives include: fall  Constitutional:  No weight loss, night sweats, Fevers, chills, fatigue, weight loss  HEENT:  No headaches, Difficulty swallowing,Tooth/dental problems,Sore throat,  No sneezing, itching, ear ache, nasal congestion, post nasal drip,  Cardio-vascular:  No chest pain, Orthopnea, PND, anasarca, dizziness, palpitations.no Bilateral lower extremity swelling  GI:  No heartburn, indigestion, abdominal pain, nausea, vomiting, diarrhea, change in bowel habits, loss of appetite, melena, blood in stool, hematemesis Resp:  no shortness of breath at rest. No dyspnea on exertion, No excess mucus, no productive cough, No non-productive cough, No coughing up of blood.No change in color of mucus.No wheezing. Skin:  no rash or lesions. No jaundice GU:  no dysuria, change in color of urine, no urgency or frequency. No straining to urinate.  No flank pain.  Musculoskeletal:  No joint pain or no joint swelling. No decreased range of motion. No back pain.  Psych:  No change in mood or  affect. No depression or anxiety. No memory loss.  Neuro: no localizing neurological complaints, no tingling, no weakness, no double vision, no gait abnormality, no slurred speech, no confusion  All systems reviewed and  apart from [place] all are negative _______________________________________________________________________________________________ Past Medical History:   Past Medical History:  Diagnosis Date   Cholecystitis    Coronary artery disease    a. 03/2017: 80-90% LAD stenosis (PCI/DES placement with a 2.75x16 mm Promus Premier stent). No significant stenosis along RCA or LCx.    DJD (degenerative joint disease)    GERD (gastroesophageal reflux disease)    Hyperlipidemia    Hypertension    dx s/p MI    Ischemic cardiomyopathy    Myocardial infarction ([place]) 03/17/2017   03-2017 treated at [facility]    Osteoporosis    Status post insertion of drug-eluting stent into left anterior descending (LAD) artery 03/2017     Past Surgical History:  Procedure Laterality Date   APPENDECTOMY     CESAREAN SECTION     CHOLECYSTECTOMY N/A 11/05/2017   Procedure: LAPAROSCOPIC CHOLECYSTECTOMY WITH INTRAOPERATIVE CHOLANGIOGRAM;  Surgeon: Armandina Gemma, MD;  Location: [facility];  Service: General;  Laterality: N/A;   ERCP N/A 11/11/2017   Procedure: ENDOSCOPIC RETROGRADE CHOLANGIOPANCREATOGRAPHY (ERCP);  Surgeon: Arta Silence, MD;  Location: [facility];  Service: Endoscopy;  Laterality: N/A;  possible   EUS N/A 11/11/2017   Procedure: UPPER ENDOSCOPIC ULTRASOUND (EUS) RADIAL;  Surgeon: Arta Silence, MD;  Location: [facility];  Service: Endoscopy;  Laterality: N/A;   FEMUR IM NAIL Right 07/13/2019   Procedure: INTRAMEDULLARY (IM) RETROGRADE FEMORAL NAILING;  Surgeon: Gaynelle Arabian, MD;  Location: [facility];  Service: Orthopedics;  Laterality: Right;  48mn   LUMBAR LAMINECTOMY/ DECOMPRESSION WITH MET-RX     SHOULDER ARTHROSCOPY WITH ROTATOR CUFF REPAIR AND SUBACROMIAL  DECOMPRESSION Right 09/20/2021   Procedure: SHOULDER MINI OPEN ROTATOR CUFF REPAIR AND SUBACROMIAL DECOMPRESSION WITH POSSIBLE PATCH GRAFT;  Surgeon: BSusa Day MD;  Location: [facility];  Service: Orthopedics;  Laterality: Right;  982MINS   stent  Left 03/2017   anterior descending ; drug eluting ; [place] [facility] [place]    vaginal sling      Social History:  Ambulatory   independently      reports that she has quit smoking. Her smoking use included cigarettes. She has a 30.00 pack-year smoking history. She has never used smokeless tobacco. She reports current alcohol use. She reports that she does not use drugs.   Family History:  Family History  Problem Relation Age of Onset   Heart disease Father    Breast cancer Sister    Pancreatic cancer Sister    ______________________________________________________________________________________________ Allergies: No Known Allergies   Prior to Admission medications   Medication Sig Start Date End Date Taking? Authorizing Provider  acetaminophen (TYLENOL) 500 MG tablet Take 500-1,000 mg by mouth every 8 (eight) hours as needed for moderate pain or headache.    [provider]  aspirin EC 81 MG tablet Take 81 mg by mouth daily. Swallow whole.    [provider]  atorvastatin (LIPITOR) 80 MG tablet Take 1 tablet (80 mg total) by mouth daily. 11/05/21   MBurnell Blanks MD  Calcium Carb-Cholecalciferol (CALCIUM 600/VITAMIN D PO) Take 1 tablet by mouth in the morning and at bedtime.    [provider]  cetirizine (ZYRTEC) 10 MG tablet Take 10 mg by mouth daily as needed for allergies.     [provider]  Cholecalciferol (VITAMIN D3) 2000 units capsule Take 2,000 Units by mouth daily.    [provider]  clotrimazole-betamethasone (LOTRISONE) cream Apply topically 2 (two) times daily. Apply topically 2 (two) times daily. 10/14/21    [provider]  CRANBERRY EXTRACT PO Take 25,000 mg by mouth daily.    [provider]  docusate sodium (COLACE) 100 MG capsule Take 1 capsule (100 mg total) by mouth 2 (two) times daily as needed for mild constipation. 09/20/21   Susa Day, MD  latanoprost (XALATAN) 0.005 % ophthalmic solution Place 1 drop into both eyes at bedtime. 08/12/21   [provider]  methenamine (HIPREX) 1 g tablet Take 1 g by mouth 2 (two) times daily. 08/22/21   [provider]  metoprolol succinate (TOPROL-XL) 50 MG 24 hr tablet Take 1 tablet (50 mg total) by mouth daily. Needs follow up appointment for further refills. Patient taking differently: Take 50 mg by mouth every evening. Needs follow up appointment for further refills. 07/13/20   Burnell Blanks, MD  mirabegron ER (MYRBETRIQ) 25 MG TB24 tablet Take 25 mg by mouth daily.    [provider]  Multiple Vitamin (MULTI-VITAMINS) TABS Take 1 tablet by mouth daily.    [provider]  nitroGLYCERIN (NITROSTAT) 0.4 MG SL tablet Place 1 tablet (0.4 mg total) under the tongue every 5 (five) minutes as needed for chest pain. 12/17/20   Burnell Blanks, MD  omeprazole (PRILOSEC) 20 MG capsule Take 20 mg by mouth daily.    [provider]  Polyethyl Glycol-Propyl Glycol (SYSTANE OP) Place 1 drop into both eyes daily as needed (dry eyes).    [provider]  Probiotic Product (TRUNATURE DIGESTIVE PROBIOTIC) CAPS Take 1 capsule by mouth daily.    [provider]  timolol (TIMOPTIC) 0.5 % ophthalmic solution Place 1 drop into the left eye 2 (two) times daily. 07/25/21   [provider]  traZODone (DESYREL) 50 MG tablet Take 50 mg by mouth at bedtime as needed for sleep.    [provider]    ___________________________________________________________________________________________________ Physical Exam:    05/12/2022   10:00 PM 05/12/2022    9:00 PM 05/12/2022     8:26 PM  Vitals with BMI  Systolic 357 017 793  Diastolic 69 65 74  Pulse 903 99 97     1. General:  in No  Acute distress  * Chronically ill *well *cachectic *toxic acutely ill -appearing 2. Psychological: Alert and  Oriented 3. Head/ENT: Dry Mucous Membranes                          Head Non traumatic, neck supple                            Poor Dentition 4. SKIN: decreased Skin turgor,  Skin clean Dry and intact no rash 5. Heart: Regular rate and rhythm no  Murmur, no Rub or gallop 6. Lungs no wheezes or crackles   7. Abdomen: Soft,  non-tender, Non distended   obese  bowel sounds present bladder distended 8. Lower extremities: no clubbing, cyanosis, no  edema 9. Neurologically Grossly intact, moving all 4 extremities equally   10. MSK: Normal range of motion  Limited in right arm due to pain   Chart has been reviewed  ______________________________________________________________________________________________    Assessment/Plan 86 y.o. female with medical history significant of CAD last cardiac stenting in 2018, GERD, HLD, HTN   Admitted for   Fall, initial encounter    Closed displaced  fracture of right acetabulum, unspecified portion of acetabulum, initial encounter   Closed fracture of proximal end of right humerus, unspecified fracture morphology, initial encounter, hematoma     Present on Admission:  CAD in native artery  Pelvis fracture ([place])  Ischemic cardiomyopathy  Hyperlipemia  Osteoporosis  Hypertension  Humeral head fracture, right, closed, initial encounter  Elevated troponin  Hematoma     CAD in native artery Hold off on aspirin given hematoma Stable continue Lipitor 6m po qday  Pelvis fracture ([facility] is aware [facility] team can see in the morning.  Hold aspirin given hematoma  Ischemic cardiomyopathy Last echogram showed improvement in EF  Hyperlipemia Continue  Lipitor 80 mg p.o. daily  Osteoporosis Check vitamin D level  Hypertension Given somewhat soft blood pressures allow permissive hypertension for tonight restart metoprolol as able  Humeral head fracture, right, closed, initial encounter May need surgical intervention all orthopedics is aware will see patient in consult. Given patient will likely need to undergo surgical intervention and has extensive history of coronary artery diseasewill notify cardiology  for preop clearance  Elevated troponin In the setting of severe pain  no CP, no SOB no change on ECG  continue to cycle and obtain echo  cardiology consulted for preop clearance  Hematoma Hold off on aspirin Follow CBC     Other plan as per orders.  DVT prophylaxis:  SCD      Code Status:    Code Status: Prior FULL CODE    as per patient   I had personally discussed CODE STATUS with patient and family    Family Communication:   Family  at  Bedside  plan of care was discussed   with  Daughter,   Disposition Plan:     likely will need placement for rehabilitation                            Following barriers for discharge:                                                         Pain controlled with PO medications                                                       Will need consultants to evaluate patient prior to discharge                      Would benefit from PT/OT eval prior to DC  Ordered                   Swallow eval - SLP ordered                     Consults called:  orthopedics are aware  , emailed cardiolgy   Admission status:  ED Disposition     ED Dis[place]  [id number]  Level of Care: Telemetry Medical [[id number]]  May [facility]  [facility] or [facility] if equivalent level of care is available:: No  Covid Evaluation: Asymptomatic - no recent exposure (last 10 days) testing not required  Diagnosis: Pelvis fracture  [facility]) [[id number]]  Admitting Physician: Toy Baker [[id number]]  Attending Physician: Toy Baker [[id number]]  Certification:: I certify this patient will need inpatient services for at least 2 midnights  Estimated Length of Stay: 2           inpatient     I Expect 2 midnight stay secondary to severity of patient's current illness need for inpatient interventions justified by the following:   Severe lab/radiological/exam abnormalities including:   Right humerus fracture and pelvic fractures and extensive comorbidities including:  CAD   That are currently affecting medical management.   I expect  patient to be hospitalized for 2 midnights requiring inpatient medical care.  Patient is at high risk for adverse outcome (such as loss of life or disability) if not treated.  Indication for inpatient stay as follows:    severe pain requiring acute inpatient management,    Need for operative/procedural  intervention     Need for , IV fluids, IV pain medications,     Level of care     tele  For 12H     Carrie Barnes 05/12/2022, 11:08 PM    [facility]     after 2 AM please page floor coverage PA If 7AM-7PM, please contact the day team taking care of the patient using [email or web address]   Patient was evaluated in the context of the global COVID-19 pandemic, which necessitated consideration that the patient might be at risk for infection with the SARS-CoV-2 virus that causes COVID-19. Institutional protocols and algorithms that pertain to the evaluation of patients at risk for COVID-19 are in a state of rapid change based on information released by regulatory bodies including the CDC and federal and state organizations. These policies and algorithms were followed during the patient's care.

## 2022-05-12 NOTE — ED Provider Notes (Signed)
[facility]-EMERGENCY DEPT Provider Note   CSN: [id number] Arrival date & time: 05/12/22  1614     History  Chief Complaint  Patient presents with   Marletta Lor    Carrie Barnes is a 86 y.o. female.  Patient is a 86 year old female who presents with shoulder and hip pain after a fall.  She was in a parking lot and was trying to grab a cart and tripped falling onto her right side.  She states that she did not hit her head.  No loss of consciousness.  She denies any neck or back pain.  She is not on anticoagulants.       Home Medications Prior to Admission medications   Medication Sig Start Date End Date Taking? Authorizing Provider  acetaminophen (TYLENOL) 500 MG tablet Take 500-1,000 mg by mouth every 8 (eight) hours as needed for moderate pain or headache.   Yes [provider]  ascorbic acid (VITAMIN C) 500 MG tablet Take 500 mg by mouth daily.   Yes [provider]  aspirin EC 81 MG tablet Take 81 mg by mouth daily. Swallow whole.   Yes [provider]  atorvastatin (LIPITOR) 80 MG tablet Take 1 tablet (80 mg total) by mouth daily. 11/05/21  Yes Kathleene Hazel, MD  Calcium Carb-Cholecalciferol (CALCIUM 600/VITAMIN D PO) Take 1 tablet by mouth in the morning and at bedtime.   Yes [provider]  cetirizine (ZYRTEC) 10 MG tablet Take 10 mg by mouth daily.   Yes [provider]  Cholecalciferol (VITAMIN D3) 2000 units capsule Take 2,000 Units by mouth daily.   Yes [provider]  CRANBERRY EXTRACT PO Take 25,000 mg by mouth daily.   Yes [provider]  docusate sodium (COLACE) 100 MG capsule Take 1 capsule (100 mg total) by mouth 2 (two) times daily as needed for mild constipation. 09/20/21  Yes Jene Every, MD  latanoprost (XALATAN) 0.005 % ophthalmic solution Place 1 drop into both eyes at bedtime. 08/12/21  Yes [provider]  methenamine (HIPREX) 1 g tablet Take 1 g by mouth 2  (two) times daily. 08/22/21  Yes [provider]  metoprolol succinate (TOPROL-XL) 50 MG 24 hr tablet Take 1 tablet (50 mg total) by mouth daily. Needs follow up appointment for further refills. Patient taking differently: Take 50 mg by mouth every evening. Needs follow up appointment for further refills. 07/13/20  Yes Kathleene Hazel, MD  Multiple Vitamin (MULTI-VITAMINS) TABS Take 1 tablet by mouth daily.   Yes [provider]  nitroGLYCERIN (NITROSTAT) 0.4 MG SL tablet Place 1 tablet (0.4 mg total) under the tongue every 5 (five) minutes as needed for chest pain. 12/17/20  Yes Kathleene Hazel, MD  omeprazole (PRILOSEC) 20 MG capsule Take 20 mg by mouth daily.   Yes [provider]  Polyethyl Glycol-Propyl Glycol (SYSTANE OP) Place 1 drop into both eyes daily as needed (dry eyes).   Yes [provider]  Probiotic Product (TRUNATURE DIGESTIVE PROBIOTIC) CAPS Take 1 capsule by mouth daily.   Yes [provider]  timolol (TIMOPTIC) 0.5 % ophthalmic solution Place 1 drop into the left eye 2 (two) times daily. 07/25/21  Yes [provider]  traZODone (DESYREL) 50 MG tablet Take 50 mg by mouth at bedtime as needed for sleep.   Yes [provider]  Trospium Chloride 60 MG CP24 Take 1 capsule by mouth daily. 04/08/22  Yes [provider]  Allergies    Patient has no known allergies.    Review of Systems   Review of Systems  Constitutional:  Negative for fever.  Gastrointestinal:  Negative for nausea and vomiting.  Musculoskeletal:  Positive for arthralgias and joint swelling. Negative for back pain and neck pain.  Skin:  Negative for wound.  Neurological:  Negative for weakness, numbness and headaches.    Physical Exam Updated Vital Signs BP (!) 140/69   Pulse 100   Temp 99.3 F (37.4 C) (Oral)   Resp 14   Ht 5\' 2"  (1.575 m)   Wt 70 kg   SpO2 98%   BMI 28.23 kg/m  Physical Exam Constitutional:       Appearance: She is well-developed.  HENT:     Head: Normocephalic and atraumatic.  Eyes:     Pupils: Pupils are equal, round, and reactive to light.  Neck:     Comments: No pain along the spine Cardiovascular:     Rate and Rhythm: Normal rate and regular rhythm.     Heart sounds: Normal heart sounds.  Pulmonary:     Effort: Pulmonary effort is normal. No respiratory distress.     Breath sounds: Normal breath sounds. No wheezing or rales.  Chest:     Chest wall: No tenderness.  Abdominal:     General: Bowel sounds are normal.     Palpations: Abdomen is soft.     Tenderness: There is no abdominal tenderness. There is no guarding or rebound.  Musculoskeletal:        General: Normal range of motion.     Cervical back: Normal range of motion and neck supple.     Comments: Positive tenderness and swelling to the right shoulder.  There is no pain to the elbow or wrist.  She is neurovascular intact distally.  She has pain on range of motion of the right hip.  No pain to the knee or ankle.  Pedal pulses are intact.  No other pain on palpation or range of motion of the extremities  Lymphadenopathy:     Cervical: No cervical adenopathy.  Skin:    General: Skin is warm and dry.     Findings: No rash.  Neurological:     Mental Status: She is alert and oriented to person, place, and time.     ED Results / Procedures / Treatments   Labs (all labs ordered are listed, but only abnormal results are displayed) Labs Reviewed  BASIC METABOLIC PANEL - Abnormal; Notable for the following components:      Result Value   Glucose, Bld 145 (*)    All other components within normal limits  CBC WITH DIFFERENTIAL/PLATELET - Abnormal; Notable for the following components:   WBC 16.2 (*)    RBC 3.40 (*)    Hemoglobin 10.4 (*)    HCT 30.1 (*)    Neutro Abs 13.7 (*)    Abs Immature Granulocytes 0.11 (*)    All other components within normal limits  URINALYSIS, ROUTINE W REFLEX MICROSCOPIC  CK   MAGNESIUM  PHOSPHORUS  HEPATIC FUNCTION PANEL  PREALBUMIN  TSH  VITAMIN B12  FOLATE  IRON AND TIBC  FERRITIN  RETICULOCYTES  TYPE AND SCREEN  TROPONIN I (HIGH SENSITIVITY)    EKG EKG Interpretation  Date/Time:  Monday May 12 2022 20:24:43 EDT Ventricular Rate:  95 PR Interval:  142 QRS Duration: 84 QT Interval:  363 QTC Calculation: 457 R Axis:   44 Text Interpretation: Sinus rhythm  since last tracing no significant change Confirmed by Malvin Johns [phone]) on 05/12/2022 8:37:15 PM  Radiology CT Shoulder Right Wo Contrast  Result Date: 05/12/2022 CLINICAL DATA:  Shoulder trauma, fracture of humerus or scapula EXAM: CT OF THE UPPER RIGHT EXTREMITY WITHOUT CONTRAST TECHNIQUE: Multidetector CT imaging of the upper right extremity was performed according to the standard protocol. RADIATION DOSE REDUCTION: This exam was performed according to the departmental dose-optimization program which includes automated exposure control, adjustment of the mA and/or kV according to patient size and/or use of iterative reconstruction technique. COMPARISON:  Same-day x-ray FINDINGS: Bones/Joint/Cartilage Acute fracture of the right humeral head and neck with transversely oriented component through the surgical neck. Humeral neck fracture impacted by approximately 2 cm. There is approximately 1.4 cm of anterior displacement. Nondisplaced fracture component involves the greater and lesser tuberosities. There appears to be disruption of the humeral head articular surface inferiorly. Glenohumeral joint alignment maintained without dislocation. Remaining visualized osseous structures are otherwise intact. Normal alignment at the [facility] joint. Ligaments Suboptimally assessed by CT. Muscles and Tendons Enlargement of the anterior deltoid muscle adjacent to the fracture site, likely representing a component of intramuscular hemorrhage. Atrophy of the infraspinatus muscle. Soft tissues Generalized soft tissue  swelling about the shoulder. No right axillary lymphadenopathy. Aortic atherosclerosis. Included right lung field is clear. IMPRESSION: 1. Acute fracture of the right humeral head and neck, as described above. 2. Enlargement of the anterior deltoid muscle adjacent to the fracture site, likely representing a component of intramuscular hemorrhage. Aortic Atherosclerosis (ICD10-I70.0). Electronically Signed   By: Davina Poke D.O.   On: 05/12/2022 19:21   CT PELVIS WO CONTRAST  Result Date: 05/12/2022 CLINICAL DATA:  Hip trauma, fracture suspected, xray done EXAM: CT PELVIS WITHOUT CONTRAST TECHNIQUE: Multidetector CT imaging of the pelvis was performed following the standard protocol without intravenous contrast. RADIATION DOSE REDUCTION: This exam was performed according to the departmental dose-optimization program which includes automated exposure control, adjustment of the mA and/or kV according to patient size and/or use of iterative reconstruction technique. COMPARISON:  03/02/2017 FINDINGS: Urinary Tract:  No abnormality visualized. Bowel: Sigmoid diverticulosis. No active inflammation or evidence of bowel obstruction within the pelvis. Vascular/Lymphatic: Aortoiliac atherosclerosis. Vascular findings are not well assessed on noncontrast imaging. Reproductive:  No mass or other significant abnormality Other:  Small right pelvic sidewall hematoma. Musculoskeletal: Acute comminuted fracture of the right acetabulum with extension into the iliac crest. Acetabular fracture involves both the anterior and posterior columns. Femoroacetabular joint alignment is maintained without dislocation. Comminuted, displaced and angulated fracture of the mid right inferior pubic ramus. Fullness within the right obturator externus and adductor magnus muscles adjacent to the right inferior pubic ramus likely reflecting a component of intramuscular hematoma. No pubic diastasis. Sacroiliac joints intact without diastasis.  Prior ORIF of the proximal right femur. No hardware complication or femur fracture. IMPRESSION: 1. Acute comminuted fracture of the right acetabulum with extension into the iliac crest. 2. Small right pelvic sidewall hematoma. 3. Right inferior pubic ramus fracture with adjacent intramuscular hematomas involving the right obturator externus and adductor magnus muscles. 4. Aortic atherosclerosis (ICD10-I70.0). Electronically Signed   By: Davina Poke D.O.   On: 05/12/2022 18:32   DG Hip Unilat W or Wo Pelvis 2-3 Views Right  Result Date: 05/12/2022 CLINICAL DATA:  Trauma, fall EXAM: DG HIP (WITH OR WITHOUT PELVIS) 2-3V RIGHT COMPARISON:  07/11/2019 FINDINGS: There is previous internal fixation of intertrochanteric fracture of right femur with intramedullary rod. There is deformity in  right inferior pubic ramus suggesting recent or old fracture. There is offset in alignment in the cortical margin in the right iliac crest suggesting possible fracture. IMPRESSION: Previous internal fixation of fracture of proximal right femur. Deformities in the right iliac crest and right inferior pubic ramus suggest possible recent fractures. If clinically warranted, follow-up CT may be considered. Electronically Signed   By: Ernie Avena M.D.   On: 05/12/2022 17:19   DG Shoulder Right  Result Date: 05/12/2022 CLINICAL DATA:  Trauma, fall EXAM: RIGHT SHOULDER - 2+ VIEW COMPARISON:  07/14/2019 FINDINGS: There is severely comminuted fracture in the neck cough right humerus. There is superior displacement of shaft of humerus in relation to the head. In the scapular Y-view, humeral head is projecting slightly more anterior than usual. IMPRESSION: Comminuted fracture is seen in the neck of right humerus. Possible anterior subluxation/dislocation in the right shoulder. Electronically Signed   By: Ernie Avena M.D.   On: 05/12/2022 17:15    Procedures Procedures    Medications Ordered in ED Medications   influenza vaccine adjuvanted (FLUAD) injection 0.5 mL (has no administration in time range)  0.9 %  sodium chloride infusion ( Intravenous New Bag/Given 05/12/22 2138)  morphine (PF) 4 MG/ML injection 4 mg (4 mg Intravenous Given 05/12/22 1737)  morphine (PF) 4 MG/ML injection 4 mg (4 mg Intravenous Given 05/12/22 1838)  morphine (PF) 4 MG/ML injection 4 mg (4 mg Intravenous Given 05/12/22 2131)    ED Course/ Medical Decision Making/ A&P                           Medical Decision Making Amount and/or Complexity of Data Reviewed External Data Reviewed: notes.    Details: Orthopedic notes Labs: ordered. Decision-making details documented in ED Course. Radiology: ordered and independent interpretation performed. Decision-making details documented in ED Course. ECG/medicine tests: ordered and independent interpretation performed. Decision-making details documented in ED Course.  Risk Prescription drug management. Decision regarding hospitalization.   Patient is a 86 year old who presents with pain in her right shoulder and hip after a fall.  She denies any head injury.  She is not on anticoagulants.  She does not have any rib pain or abdominal pain on exam.  X-rays show a proximal humerus fracture.  It is difficult to ascertain on the x-rays whether she has some subluxation of the humeral head.  This was interpreted by me and confirmed by the radiologist.  CT scan was performed which shows a proximal humerus fracture.  And there is no dislocation noted.  She had x-rays of her right hip which show a possible pubic rami fracture.  CT scan was performed which shows evidence of an acetabular fracture.  Labs are obtained and are nonconcerning.  I discussed these findings with Dr. Charlann Boxer with orthopedics.  Patient is a current patient of the [facility].  He recommends that patient be admitted to [facility] and will have an Ortho trauma consult in the morning.  He recommends patient to be n.p.o. after  midnight.  I spoke with Dr. Adela Glimpse with the hospitalist service he will admit the patient for further treatment.  Final Clinical Impression(s) / ED Diagnoses Final diagnoses:  Fall, initial encounter  Closed displaced fracture of right acetabulum, unspecified portion of acetabulum, initial encounter (HCC)  Closed fracture of proximal end of right humerus, unspecified fracture morphology, initial encounter    Rx / DC Orders ED Discharge Orders     None  Rolan Bucco, MD 05/12/22 2249

## 2022-05-12 NOTE — Assessment & Plan Note (Signed)
Hold off on aspirin Follow CBC

## 2022-05-12 NOTE — Assessment & Plan Note (Signed)
Orthopedics team is aware wants to admit to Carrie Barnes said that orthopedics trauma team can see in the morning.  Hold aspirin given hematoma

## 2022-05-12 NOTE — ED Notes (Signed)
Report called and given to [facility] at [facility].

## 2022-05-12 NOTE — Assessment & Plan Note (Signed)
Last echogram showed improvement in EF

## 2022-05-12 NOTE — Subjective & Objective (Signed)
Had a mechanical fall in the parking lot did not hit her head no C not on blood thinners.  Noted to have pain in her right shoulder and right hip. Patient does take baby aspirin Patient has known CAD was admitted to [facility] in August 2018 and had MI with drug-eluting stent placed in LAD initially she did have evidence of CHF but her EF has improved since then and she has been followed by cardiology

## 2022-05-13 ENCOUNTER — Inpatient Hospital Stay (HOSPITAL_COMMUNITY): Payer: Medicare Other

## 2022-05-13 DIAGNOSIS — R7989 Other specified abnormal findings of blood chemistry: Secondary | ICD-10-CM

## 2022-05-13 DIAGNOSIS — R778 Other specified abnormalities of plasma proteins: Secondary | ICD-10-CM

## 2022-05-13 DIAGNOSIS — I10 Essential (primary) hypertension: Secondary | ICD-10-CM

## 2022-05-13 DIAGNOSIS — I2510 Atherosclerotic heart disease of native coronary artery without angina pectoris: Secondary | ICD-10-CM | POA: Diagnosis not present

## 2022-05-13 DIAGNOSIS — S32401D Unspecified fracture of right acetabulum, subsequent encounter for fracture with routine healing: Secondary | ICD-10-CM

## 2022-05-13 DIAGNOSIS — E782 Mixed hyperlipidemia: Secondary | ICD-10-CM | POA: Diagnosis not present

## 2022-05-13 DIAGNOSIS — Z01810 Encounter for preprocedural cardiovascular examination: Secondary | ICD-10-CM

## 2022-05-13 LAB — RETICULOCYTES
Immature Retic Fract: 17.4 % — ABNORMAL HIGH (ref 2.3–15.9)
RBC.: 3.33 MIL/uL — ABNORMAL LOW (ref 3.87–5.11)
Retic Count, Absolute: 77.9 10*3/uL (ref 19.0–186.0)
Retic Ct Pct: 2.3 % (ref 0.4–3.1)

## 2022-05-13 LAB — PREALBUMIN
Prealbumin: 20 mg/dL (ref 18–38)
Prealbumin: 19 mg/dL (ref 18–38)

## 2022-05-13 LAB — MAGNESIUM
Magnesium: 1.9 mg/dL (ref 1.7–2.4)
Magnesium: 1.7 mg/dL (ref 1.7–2.4)

## 2022-05-13 LAB — FOLATE: Folate: 32.7 ng/mL (ref >5.9)

## 2022-05-13 LAB — FERRITIN: Ferritin: 167 ng/mL (ref 11–307)

## 2022-05-13 LAB — URINALYSIS, ROUTINE W REFLEX MICROSCOPIC
Bilirubin Urine: NEGATIVE
Glucose, UA: NEGATIVE mg/dL
Hgb urine dipstick: NEGATIVE
Ketones, ur: 5 mg/dL — AB
Nitrite: NEGATIVE
Protein, ur: NEGATIVE mg/dL
Specific Gravity, Urine: 1.011 (ref 1.005–1.030)
pH: 6 (ref 5.0–8.0)

## 2022-05-13 LAB — ECHOCARDIOGRAM COMPLETE
Area-P 1/2: 3.42 cm2
Calc EF: 49.2 %
Height: 62 in
S' Lateral: 3.7 cm
Single Plane A2C EF: 49.3 %
Single Plane A4C EF: 48.1 %
Weight: 2469.15 oz

## 2022-05-13 LAB — IRON AND TIBC
Iron: 28 ug/dL (ref 28–170)
Saturation Ratios: 10 % — ABNORMAL LOW (ref 10.4–31.8)
TIBC: 277 ug/dL (ref 250–450)
UIBC: 249 ug/dL

## 2022-05-13 LAB — VITAMIN D 25 HYDROXY (VIT D DEFICIENCY, FRACTURES): Vit D, 25-Hydroxy: 42.71 ng/mL (ref 30–100)

## 2022-05-13 LAB — VITAMIN B12: Vitamin B-12: 320 pg/mL (ref 180–914)

## 2022-05-13 LAB — TROPONIN I (HIGH SENSITIVITY): Troponin I (High Sensitivity): 26 ng/L — ABNORMAL HIGH (ref <18)

## 2022-05-13 MED ORDER — MORPHINE SULFATE (PF) 2 MG/ML IV SOLN: 0.5000 mg | INTRAVENOUS | Status: DC | PRN

## 2022-05-13 MED ORDER — METOPROLOL SUCCINATE ER 25 MG PO TB24
25.0000 mg | ORAL_TABLET | Freq: Every day | ORAL | Status: DC
  Administered 2022-05-14 – 2022-05-16 (×3): 25 mg via ORAL
  Filled 2022-05-13 (×3): qty 1

## 2022-05-13 MED ORDER — POVIDONE-IODINE 10 % EX SWAB
2.0000 | Freq: Once | CUTANEOUS | Status: AC
  Administered 2022-05-14: 2 via TOPICAL

## 2022-05-13 MED ORDER — LATANOPROST 0.005 % OP SOLN
1.0000 [drp] | Freq: Every day | OPHTHALMIC | Status: DC
  Administered 2022-05-13 – 2022-05-20 (×8): 1 [drp] via OPHTHALMIC
  Filled 2022-05-13: qty 2.5

## 2022-05-13 MED ORDER — DOCUSATE SODIUM 100 MG PO CAPS
100.0000 mg | ORAL_CAPSULE | Freq: Two times a day (BID) | ORAL | Status: DC
  Administered 2022-05-13 – 2022-05-14 (×4): 100 mg via ORAL
  Filled 2022-05-13 (×4): qty 1

## 2022-05-13 MED ORDER — METHENAMINE HIPPURATE 1 G PO TABS: 1.0000 g | ORAL_TABLET | Freq: Two times a day (BID) | ORAL | Status: DC

## 2022-05-13 MED ORDER — CEFAZOLIN SODIUM-DEXTROSE 2-4 GM/100ML-% IV SOLN
2.0000 g | INTRAVENOUS | Status: AC
  Administered 2022-05-14: 2 g via INTRAVENOUS
  Filled 2022-05-13: qty 100

## 2022-05-13 MED ORDER — METHOCARBAMOL 1000 MG/10ML IJ SOLN: 500.0000 mg | Freq: Four times a day (QID) | INTRAVENOUS | Status: DC | PRN

## 2022-05-13 MED ORDER — TIMOLOL MALEATE 0.5 % OP SOLN
1.0000 [drp] | Freq: Two times a day (BID) | OPHTHALMIC | Status: DC
  Administered 2022-05-13 – 2022-05-21 (×15): 1 [drp] via OPHTHALMIC
  Filled 2022-05-13 (×4): qty 5

## 2022-05-13 MED ORDER — METOPROLOL SUCCINATE ER 25 MG PO TB24: 50.0000 mg | ORAL_TABLET | Freq: Every day | ORAL | Status: DC

## 2022-05-13 MED ORDER — ENSURE ENLIVE PO LIQD
237.0000 mL | Freq: Two times a day (BID) | ORAL | Status: DC
  Administered 2022-05-13 – 2022-05-21 (×13): 237 mL via ORAL

## 2022-05-13 MED ORDER — CHLORHEXIDINE GLUCONATE 4 % EX LIQD
60.0000 mL | Freq: Once | CUTANEOUS | Status: AC
  Administered 2022-05-14: 4 via TOPICAL
  Filled 2022-05-13: qty 60

## 2022-05-13 MED ORDER — PANTOPRAZOLE SODIUM 40 MG PO TBEC
40.0000 mg | DELAYED_RELEASE_TABLET | Freq: Every day | ORAL | Status: DC
  Administered 2022-05-13 – 2022-05-21 (×9): 40 mg via ORAL
  Filled 2022-05-13 (×9): qty 1

## 2022-05-13 MED ORDER — URELLE 81 MG PO TABS
1.0000 | ORAL_TABLET | Freq: Four times a day (QID) | ORAL | Status: DC
  Administered 2022-05-13 – 2022-05-21 (×29): 81 mg via ORAL
  Filled 2022-05-13 (×36): qty 1

## 2022-05-13 MED ORDER — SODIUM CHLORIDE 0.9 % IV SOLN: INTRAVENOUS | Status: DC

## 2022-05-13 MED ORDER — SENNA 8.6 MG PO TABS
1.0000 | ORAL_TABLET | Freq: Two times a day (BID) | ORAL | Status: DC
  Administered 2022-05-13 – 2022-05-19 (×13): 8.6 mg via ORAL
  Filled 2022-05-13 (×12): qty 1

## 2022-05-13 MED ORDER — TRANEXAMIC ACID-NACL 1000-0.7 MG/100ML-% IV SOLN
1000.0000 mg | INTRAVENOUS | Status: AC
  Administered 2022-05-14: 1000 mg via INTRAVENOUS
  Filled 2022-05-13 (×2): qty 100

## 2022-05-13 MED ORDER — METHOCARBAMOL 500 MG PO TABS
500.0000 mg | ORAL_TABLET | Freq: Four times a day (QID) | ORAL | Status: DC | PRN
  Administered 2022-05-13 – 2022-05-21 (×8): 500 mg via ORAL
  Filled 2022-05-13 (×9): qty 1

## 2022-05-13 MED ORDER — PERFLUTREN LIPID MICROSPHERE
1.0000 mL | INTRAVENOUS | Status: DC | PRN
  Administered 2022-05-13: 2 mL via INTRAVENOUS

## 2022-05-13 MED ORDER — ADULT MULTIVITAMIN W/MINERALS CH
1.0000 | ORAL_TABLET | Freq: Every day | ORAL | Status: DC
  Administered 2022-05-13 – 2022-05-21 (×8): 1 via ORAL
  Filled 2022-05-13 (×8): qty 1

## 2022-05-13 MED ORDER — CHLORHEXIDINE GLUCONATE CLOTH 2 % EX PADS
6.0000 | MEDICATED_PAD | Freq: Every day | CUTANEOUS | Status: DC
  Administered 2022-05-13 – 2022-05-20 (×8): 6 via TOPICAL

## 2022-05-13 MED ORDER — TRAZODONE HCL 50 MG PO TABS
50.0000 mg | ORAL_TABLET | Freq: Every evening | ORAL | Status: DC | PRN
  Administered 2022-05-17 – 2022-05-19 (×3): 50 mg via ORAL
  Filled 2022-05-13 (×3): qty 1

## 2022-05-13 MED ORDER — HYDROCODONE-ACETAMINOPHEN 5-325 MG PO TABS
1.0000 | ORAL_TABLET | Freq: Four times a day (QID) | ORAL | Status: DC | PRN
  Administered 2022-05-13 (×3): 2 via ORAL
  Filled 2022-05-13 (×3): qty 2

## 2022-05-13 MED ORDER — ATORVASTATIN CALCIUM 80 MG PO TABS
80.0000 mg | ORAL_TABLET | Freq: Every day | ORAL | Status: DC
  Administered 2022-05-13 – 2022-05-21 (×8): 80 mg via ORAL
  Filled 2022-05-13 (×8): qty 1

## 2022-05-13 NOTE — Progress Notes (Signed)
  Echocardiogram 2D Echocardiogram has been performed.  Carrie Barnes 05/13/2022, 9:19 AM

## 2022-05-13 NOTE — Progress Notes (Signed)
SLP Cancellation Note  Patient Details Name: CORBYN STEEDMAN MRN: [id number] DOB: Aug 02, 1936   Cancelled treatment:       Reason Eval/Treat Not Completed: Medical issues which prohibited therapy. Per chart and as confirmed with RN, pt NPO at this time pending possible surgical intervention. SLP will hold swallow eval until able to resume POs.     Osie Bond., M.A. [place] Office [phone]  Secure chat preferred  05/13/2022, 8:42 AM

## 2022-05-13 NOTE — Progress Notes (Addendum)
Carrie Barnes  NLZ:[id number] DOB: Jan 26, 1936 DOA: 05/12/2022 PCP: Renford Dills, MD    Brief Narrative:  86 year old with a history of CAD status post stenting in 2018, HLD, and HTN who suffered a mechanical fall in a parking lot after which she complained of severe pain in her hip.  In the ER x-rays revealed a comminuted fracture of the right humeral neck as well as an acute comminuted fracture of the right acetabulum with extension into the iliac crest.  Consultants:  Orthopedics  Cardiology   Goals of Care:  Code Status: Full Code   DVT prophylaxis: SCDs until post-op  Interim Hx: Afebrile.  Vital signs stable. In good spirits. Reports pain is well controlled. No chest pain or sob. No abdom pain.   Assessment & Plan:  Acute fractures of the right acetabulum and right humerus status post mechanical fall Care per orthopedic surgery  Coronary artery disease status post DES 2018 Clinically stable - no hx suggestive of USAP or CHF - was seen by Cards at request of Ortho, and Cards confirmed there is no need for further pre-operative evaluation   Hypomagnesemia Corrected with supplementation  Suboptimal B12 B12 320 - supplement with goal of 400+  HTN Blood pressure well controlled at present  HLD Continue usual dose of Lipitor  Chronic normocytic anemia Follow Hgb trend -iron studies suggestive of chronic disease with low iron state   Family Communication: no family present at time of exam  Disposition:  will depend upon performance w/ PT/OT postoperatively    Objective: Blood pressure (!) 109/58, pulse 97, temperature 99 F (37.2 C), temperature source Oral, resp. rate 17, height 5\' 2"  (1.575 m), weight 70 kg, SpO2 97 %.  Intake/Output Summary (Last 24 hours) at 05/13/2022 1029 Last data filed at 05/13/2022 0900 Gross per 24 hour  Intake 390.98 ml  Output --  Net 390.98 ml   Filed Weights   05/12/22 1627  Weight: 70 kg    Examination: General: No acute  respiratory distress Lungs: Clear to auscultation bilaterally without wheezes or crackles Cardiovascular: Regular rate and rhythm without murmur gallop or rub normal S1 and S2 Abdomen: Nontender, nondistended, soft, bowel sounds positive, no rebound, no ascites, no appreciable mass Extremities: No significant cyanosis, clubbing, or edema bilateral lower extremities  CBC: Recent Labs  Lab 05/12/22 2055  WBC 16.2*  NEUTROABS 13.7*  HGB 10.4*  HCT 30.1*  MCV 88.5  PLT 159   Basic Metabolic Panel: Recent Labs  Lab 05/12/22 2055 05/13/22 0145  NA 139  --   K 3.6  --   CL 103  --   CO2 27  --   GLUCOSE 145*  --   BUN 12  --   CREATININE 0.88  --   CALCIUM 9.1  --   MG 1.6* 1.9  PHOS 3.7  --    GFR: Estimated Creatinine Clearance: 43.6 mL/min (by C-G formula based on SCr of 0.88 mg/dL).   Scheduled Meds:  atorvastatin  80 mg Oral Daily   Chlorhexidine Gluconate Cloth  6 each Topical Daily   docusate sodium  100 mg Oral BID   latanoprost  1 drop Both Eyes QHS   pantoprazole  40 mg Oral Daily   senna  1 tablet Oral BID   Continuous Infusions:  sodium chloride 75 mL/hr at 05/13/22 0115   methocarbamol (ROBAXIN) IV       LOS: 1 day   07/13/22, MD [facility] Office  [phone] Pager -  Text Page per Shea Evans  If 7PM-7AM, please contact night-coverage per Amion 05/13/2022, 10:29 AM

## 2022-05-13 NOTE — Progress Notes (Signed)
0240: Received a call from [place]. that pt had 12 beats run of V-Tach and her HR increased to 154 at 0230. Pt was asleep at that time. VS taken, pt HR was 101. On call provider notified and aware.

## 2022-05-13 NOTE — Progress Notes (Signed)
Initial Nutrition Assessment  DOCUMENTATION CODES:  Not applicable  INTERVENTION:  Continue regular diet Ensure Enlive po BID, each supplement provides 350 kcal and 20 grams of protein. MVI with minerals daily  NUTRITION DIAGNOSIS:   Increased nutrient needs related to hip fracture as evidenced by estimated needs.  GOAL:   Patient will meet greater than or equal to 90% of their needs  MONITOR:   PO intake, Supplement acceptance, Diet advancement  REASON FOR ASSESSMENT:   Consult Hip fracture protocol  ASSESSMENT:  Pt with hx of CAD, HTN, and CHF presented to ED with shoulder and hip pain after a fall. Imaging showed fractures to the right humerus and the right hip.  Pt resting in bed eating breakfast. States she is having heartburn currently and doesn't think she will be able to eat much more. Noted that protonix was given this AM. Pt reports she has acid reflux at home as well and takes prilosec daily.  Pt reports eating 3x/d at home but that portions have gotten smaller overtime. Denies major changes to energy or functional status. Lives alone at baseline.  Pt to undergo surgery tomorrow. Discussed increased needs with wound healing and recovery from surgery. Pt agreeable to ensure after surgery.      Average Meal Intake: 10/3: 10% intake x 1 recorded meals  Nutritionally Relevant Medications: Scheduled Meds:  atorvastatin  80 mg Oral Daily   docusate sodium  100 mg Oral BID   pantoprazole  40 mg Oral Daily   senna  1 tablet Oral BID   Continuous Infusions:  sodium chloride 75 mL/hr at 05/13/22 0115   Labs Reviewed  NUTRITION - FOCUSED PHYSICAL EXAM: Flowsheet Row Most Recent Value  Orbital Region No depletion  Upper Arm Region No depletion  Thoracic and Lumbar Region No depletion  Buccal Region No depletion  Temple Region No depletion  Clavicle Bone Region No depletion  Clavicle and Acromion Bone Region No depletion  Scapular Bone Region No depletion   Dorsal Hand No depletion  Patellar Region No depletion  Anterior Thigh Region No depletion  Posterior Calf Region No depletion  Edema (RD Assessment) Mild  Hair Reviewed  Eyes Reviewed  Mouth Reviewed  Skin Reviewed  Nails Reviewed    Diet Order:   Diet Order             Diet NPO time specified Except for: Sips with Meds  Diet effective midnight           Diet regular Room service appropriate? Yes; Fluid consistency: Thin  Diet effective now                   EDUCATION NEEDS:   No education needs have been identified at this time  Skin:  Skin Assessment: Reviewed RN Assessment  Last BM:  9/30  Height:   Ht Readings from Last 1 Encounters:  05/12/22 5\' 2"  (1.575 m)    Weight:   Wt Readings from Last 1 Encounters:  05/12/22 70 kg    Ideal Body Weight:  50 kg  BMI:  Body mass index is 28.23 kg/m.  Estimated Nutritional Needs:  Kcal:  1500-1700 kcal/d Protein:  75-90g/d Fluid:  >/=1.5L/d    Ranell Patrick, RD, LDN Clinical Dietitian RD pager # available in [facility]  After hours/weekend pager # available in [facility]

## 2022-05-13 NOTE — Assessment & Plan Note (Signed)
Will replace and recheck in AM 

## 2022-05-13 NOTE — Consult Note (Addendum)
Cardiology Consultation   Patient ID: Carrie Barnes MRN: [id number]; DOB: [birth year]  Admit date: 05/12/2022 Date of Consult: 05/13/2022  PCP:  Seward Carol, [place] [facility] Cardiologist:  Lauree Chandler, MD     Patient Profile:   Carrie Barnes is a 86 y.o. female with a hx of CAD, ischemic cardiomyopathy, HTN, HLD, spinal stenosis who is being seen 05/13/2022 for preoperative evaluation at the request of Dr. Thereasa Solo.  History of Present Illness:   Carrie Barnes is an 86 year old female with above medical history who is followed by Dr. Angelena Form.  Per chart review, patient was admitted to [facility] in 03/2017 with an MI.  Had a DES placed to the LAD.  LVEF at the time of her MI was 35-40%, however EF had improved to 55-60% on echo in our office in 04/2017.  Patient later wore cardiac monitor in 02/2019 that showed sinus rhythm with PACs.  Echocardiogram in December 2020 showed EF 60-65%, normal RV systolic function, no valvular disease.  She has a history of difficult to control hypertension and has been followed by our hypertension clinic.  Patient was last seen by cardiology on 12/25/2021.  At that time, patient denied any chest pain, dyspnea, palpitations, lower extremity edema, orthopnea, PND, dizziness, near-syncope or syncope.  BP had been in the 242P-536R systolic at home.  Patient presented to the ER on 10/2 after having a mechanical fall in a parking lot.  Patient had fallen onto her right side.  Had immediate right shoulder and hip pain and could not get up.  EMS was called, patient was brought to the ED where x-rays showed right proximal humerus and right acetabulum fractures.  Therapeutic surgery was consulted, planning ORIF of right acetabulum fracture tomorrow morning.  Patient was admitted to the [facility] service. They checked hsTn, 243>>26. Echocardiogram on 05/13/22 showed EF 65-70%, no regional wall motion abnormalities, grade 1  diastolic dysfunction, normal RV systolic function.   On interview, patient reports that she has not had any issues with her heart since having her stent placed in 2018. She denies having any chest pain, exertional chest pain, sob, palpitations. She is able to walk up and down stairs, go shopping, and go for walks outside without shortness of breath or chest pain.  Yesterday, she had a mechanical fall. She denies having any chest pain before or after the fall.   Past Medical History:  Diagnosis Date   Cholecystitis    Coronary artery disease    a. 03/2017: 80-90% LAD stenosis (PCI/DES placement with a 2.75x16 mm Promus Premier stent). No significant stenosis along RCA or LCx.    DJD (degenerative joint disease)    GERD (gastroesophageal reflux disease)    Hyperlipidemia    Hypertension    dx s/p MI    Ischemic cardiomyopathy    Myocardial infarction ([place]) 03/17/2017   03-2017 treated at new [place] [facility]    Osteoporosis    Status post insertion of drug-eluting stent into left anterior descending (LAD) artery 03/2017    Past Surgical History:  Procedure Laterality Date   APPENDECTOMY     CESAREAN SECTION     CHOLECYSTECTOMY N/A 11/05/2017   Procedure: LAPAROSCOPIC CHOLECYSTECTOMY WITH INTRAOPERATIVE CHOLANGIOGRAM;  Surgeon: Armandina Gemma, MD;  Location: [facility];  Service: General;  Laterality: N/A;   ERCP N/A 11/11/2017   Procedure: ENDOSCOPIC RETROGRADE CHOLANGIOPANCREATOGRAPHY (ERCP);  Surgeon: Arta Silence, MD;  Location: [facility];  Service: Endoscopy;  Laterality:  N/A;  possible   EUS N/A 11/11/2017   Procedure: UPPER ENDOSCOPIC ULTRASOUND (EUS) RADIAL;  Surgeon: Arta Silence, MD;  Location: [facility];  Service: Endoscopy;  Laterality: N/A;   FEMUR IM NAIL Right 07/13/2019   Procedure: INTRAMEDULLARY (IM) RETROGRADE FEMORAL NAILING;  Surgeon: Gaynelle Arabian, MD;  Location: [facility];  Service: Orthopedics;  Laterality: Right;  76mn   LUMBAR LAMINECTOMY/  DECOMPRESSION WITH MET-RX     SHOULDER ARTHROSCOPY WITH ROTATOR CUFF REPAIR AND SUBACROMIAL DECOMPRESSION Right 09/20/2021   Procedure: SHOULDER MINI OPEN ROTATOR CUFF REPAIR AND SUBACROMIAL DECOMPRESSION WITH POSSIBLE PATCH GRAFT;  Surgeon: BSusa Day MD;  Location: [facility];  Service: Orthopedics;  Laterality: Right;  969MINS   stent  Left 03/2017   anterior descending ; drug eluting ; [place] [facility] [place]    vaginal sling       Home Medications:  Prior to Admission medications   Medication Sig Start Date End Date Taking? Authorizing Provider  acetaminophen (TYLENOL) 500 MG tablet Take 500-1,000 mg by mouth every 8 (eight) hours as needed for moderate pain or headache.   Yes [provider]  ascorbic acid (VITAMIN C) 500 MG tablet Take 500 mg by mouth daily.   Yes [provider]  aspirin EC 81 MG tablet Take 81 mg by mouth daily. Swallow whole.   Yes [provider]  atorvastatin (LIPITOR) 80 MG tablet Take 1 tablet (80 mg total) by mouth daily. 11/05/21  Yes MBurnell Blanks MD  Calcium Carb-Cholecalciferol (CALCIUM 600/VITAMIN D PO) Take 1 tablet by mouth in the morning and at bedtime.   Yes [provider]  cetirizine (ZYRTEC) 10 MG tablet Take 10 mg by mouth daily.   Yes [provider]  Cholecalciferol (VITAMIN D3) 2000 units capsule Take 2,000 Units by mouth daily.   Yes [provider]  CRANBERRY EXTRACT PO Take 25,000 mg by mouth daily.   Yes [provider]  docusate sodium (COLACE) 100 MG capsule Take 1 capsule (100 mg total) by mouth 2 (two) times daily as needed for mild constipation. 09/20/21  Yes BSusa Day MD  latanoprost (XALATAN) 0.005 % ophthalmic solution Place 1 drop into both eyes at bedtime. 08/12/21  Yes [provider]  methenamine (HIPREX) 1 g tablet Take 1 g by mouth 2 (two) times daily. 08/22/21  Yes [provider]  metoprolol succinate (TOPROL-XL) 50 MG 24 hr tablet Take 1 tablet (50 mg total) by mouth daily. Needs follow up appointment for further refills. Patient taking differently: Take 50 mg by mouth every evening. Needs follow up appointment for further refills. 07/13/20  Yes MBurnell Blanks MD  Multiple Vitamin (MULTI-VITAMINS) TABS Take 1 tablet by mouth daily.   Yes [provider]  nitroGLYCERIN (NITROSTAT) 0.4 MG SL tablet Place 1 tablet (0.4 mg total) under the tongue every 5 (five) minutes as needed for chest pain. 12/17/20  Yes MBurnell Blanks MD  omeprazole (PRILOSEC) 20 MG capsule Take 20 mg by mouth daily.   Yes [provider]  Polyethyl Glycol-Propyl Glycol (SYSTANE OP) Place 1 drop into both eyes daily as needed (dry eyes).   Yes [provider]  Probiotic Product (TRUNATURE DIGESTIVE PROBIOTIC) CAPS Take 1 capsule by mouth daily.   Yes [provider]  timolol (TIMOPTIC) 0.5 % ophthalmic solution Place 1 drop into the left eye 2 (two) times daily. 07/25/21  Yes [provider]  traZODone (DESYREL) 50 MG tablet Take 50 mg by mouth at bedtime as needed for sleep.   Yes [provider]  Trospium Chloride 60 MG CP24 Take 1 capsule by mouth daily. 04/08/22  Yes [provider]    Inpatient Medications: Scheduled Meds:  atorvastatin  80 mg Oral Daily   Chlorhexidine Gluconate Cloth  6 each Topical Daily   docusate sodium  100 mg Oral BID   latanoprost  1 drop Both Eyes QHS   methenamine  1 g Oral BID   metoprolol succinate  25 mg Oral Daily   pantoprazole  40 mg Oral Daily   senna  1 tablet Oral BID   timolol  1 drop Left Eye BID   Continuous Infusions:  sodium chloride 75 mL/hr at 05/13/22 0115   methocarbamol (ROBAXIN) IV     PRN Meds: HYDROcodone-acetaminophen, methocarbamol **OR** methocarbamol (ROBAXIN) IV, morphine injection, morphine injection, perflutren lipid microspheres (DEFINITY) IV  suspension, traZODone  Allergies:   No Known Allergies  Social History:   Social History   Socioeconomic History   Marital status: Widowed    Spouse name: Not on file   Number of children: 4   Years of education: Not on file   Highest education level: Not on file  Occupational History   Occupation: Homemaker   Occupation: [employer]   Tobacco Use   Smoking status: Former    Packs/day: 1.00    Years: 30.00    Total pack years: 30.00    Types: Cigarettes   Smokeless tobacco: Never   Tobacco comments:    quit 30 years   Vaping Use   Vaping Use: Never used  Substance and Sexual Activity   Alcohol use: Yes    Comment: 3 times a year   Drug use: Never   Sexual activity: Not on file  Other Topics Concern   Not on file  Social History Narrative   Not on file   Social Determinants of Health   Financial Resource Strain: Not on file  Food Insecurity: No Food Insecurity (05/12/2022)   Hunger Vital Sign    Worried About Running Out of Food in the Last Year: Never true    Ran Out of Food in the Last Year: Never true  Transportation Needs: No Transportation Needs (05/12/2022)   PRAPARE - Hydrologist (Medical): No    Lack of Transportation (Non-Medical): No  Physical Activity: Not on file  Stress: Not on file  Social Connections: Not on file  Intimate Partner Violence: Not At Risk (05/12/2022)   Humiliation, Afraid, Rape, and Kick questionnaire    Fear of Current or Ex-Partner: No    Emotionally Abused: No    Physically Abused: No    Sexually Abused: No    Family History:    Family History  Problem Relation Age of Onset   Heart disease Father    Breast cancer Sister    Pancreatic cancer Sister      ROS:  Please see the history of present illness.   All other ROS reviewed and negative.     Physical Exam/Data:   Vitals:   05/12/22 2351 05/13/22 0054 05/13/22 0259 05/13/22 0945  BP: 127/63 129/64 135/68 (!) 109/58   Pulse: (!) 106 (!) 105 (!) 101 97  Resp: _0 Temp: 99 F (37.2 C) 99.3 F (37.4 C) 98.9 F (37.2 C) 99 F (37.2 C)  TempSrc: Oral  Oral Oral Oral  SpO2: 97% 90% 98% 97%  Weight:      Height:        Intake/Output Summary (Last 24 hours) at 05/13/2022 1058 Last data filed at 05/13/2022 0900 Gross per 24 hour  Intake 390.98 ml  Output --  Net 390.98 ml      05/12/2022    4:27 PM 12/25/2021    4:02 PM 12/03/2021   11:14 AM  Last 3 Weights  Weight (lbs) 154 lb 5.2 oz 155 lb 9.6 oz 156 lb 9.6 oz  Weight (kg) 70 kg 70.58 kg 71.033 kg     Body mass index is 28.23 kg/m.  General:  Well nourished, well developed, in no acute distress. Laying comfortably in the bed with head elevated  HEENT: normal Neck: no JVD Vascular: Radial and dorsalis pedis pulses 2+ bilaterally Cardiac:  normal S1, S2; RRR; grade 2/6 systolic murmur at LUSB  Lungs:  clear to auscultation bilaterally, no wheezing, rhonchi or rales. Normal WOB on room air  Abd: soft, nontender, no hepatomegaly  Ext: no edema in bilateral lower extremities  Musculoskeletal:  right shoulder with bruising and welling. Right hip  Skin: warm and dry  Neuro:  CNs 2-12 intact, no focal abnormalities noted Psych:  Normal affect   EKG:  The EKG was personally reviewed and demonstrates:  Normal Sinus rhythm, HR 100 BPM Telemetry:  Telemetry was personally reviewed and demonstrates: Normal sinus rhythm   Relevant CV Studies:  Echocardiogram 05/13/22  1. Left ventricular ejection fraction, by estimation, is 65 to 70%. The  left ventricle has normal function. The left ventricle has no regional  wall motion abnormalities. Left ventricular diastolic parameters are  consistent with Grade I diastolic  dysfunction (impaired relaxation).   2. Right ventricular systolic function is normal. The right ventricular  size is normal.   3. The mitral valve is normal in structure. No evidence of mitral valve  regurgitation.   4. The  aortic valve is tricuspid. There is mild calcification of the  aortic valve. There is mild thickening of the aortic valve. Aortic valve  regurgitation is not visualized. Aortic valve sclerosis is present, with  no evidence of aortic valve stenosis.   Comparison(s): No significant change from prior study. Prior images  reviewed side by side.   Laboratory Data:  High Sensitivity Troponin:   Recent Labs  Lab 05/12/22 2055 05/13/22 0017  TROPONINIHS 243* 26*     Chemistry Recent Labs  Lab 05/12/22 2055 05/13/22 0145  NA 139  --   K 3.6  --   CL 103  --   CO2 27  --   GLUCOSE 145*  --   BUN 12  --   CREATININE 0.88  --   CALCIUM 9.1  --   MG 1.6* 1.9  GFRNONAA >60  --   ANIONGAP 9  --     Recent Labs  Lab 05/12/22 2055  PROT 6.3*  ALBUMIN 3.8  AST 29  ALT 19  ALKPHOS 46  BILITOT 1.0   Lipids No results for input(s): "CHOL", "TRIG", "HDL", "LABVLDL", "LDLCALC", "CHOLHDL" in the last 168 hours.  Hematology Recent Labs  Lab 05/12/22 2055 05/13/22 0017  WBC 16.2*  --   RBC 3.40* 3.33*  HGB 10.4*  --   HCT 30.1*  --   MCV 88.5  --   MCH 30.6  --   MCHC 34.6  --   RDW 12.0  --   PLT 159  --  Thyroid  Recent Labs  Lab 05/12/22 2055  TSH 2.501    BNPNo results for input(s): "BNP", "PROBNP" in the last 168 hours.  DDimer No results for input(s): "DDIMER" in the last 168 hours.   Radiology/Studies:  ECHOCARDIOGRAM COMPLETE  Result Date: 05/13/2022    ECHOCARDIOGRAM REPORT   Patient Name:   Carrie Barnes [facility] Date of Exam: 05/13/2022 Medical Rec #:  [id number]                Height:       62.0 in Accession #:    [id number]               Weight:       154.3 lb Date of Birth:  05-Sep-1935                 BSA:          1.712 m Patient Age:    52 years                 BP:           135/68 mmHg Patient Gender: F                        HR:           96 bpm. Exam Location:  Inpatient Procedure: 2D Echo, Cardiac Doppler, Color Doppler and Intracardiac             Opacification Agent Indications:    Elevated troponin  History:        Patient has prior history of Echocardiogram examinations, most                 recent 07/12/2019. CHF, CAD and Previous Myocardial Infarction;                 Risk Factors:Hypertension and Dyslipidemia.  Sonographer:    Eartha Inch Referring Phys: [id number] ANASTASSIA DOUTOVA  Sonographer Comments: Technically difficult study due to poor echo windows. Image acquisition challenging due to patient body habitus and Image acquisition challenging due to respiratory motion. IMPRESSIONS  1. Left ventricular ejection fraction, by estimation, is 65 to 70%. The left ventricle has normal function. The left ventricle has no regional wall motion abnormalities. Left ventricular diastolic parameters are consistent with Grade I diastolic dysfunction (impaired relaxation).  2. Right ventricular systolic function is normal. The right ventricular size is normal.  3. The mitral valve is normal in structure. No evidence of mitral valve regurgitation.  4. The aortic valve is tricuspid. There is mild calcification of the aortic valve. There is mild thickening of the aortic valve. Aortic valve regurgitation is not visualized. Aortic valve sclerosis is present, with no evidence of aortic valve stenosis. Comparison(s): No significant change from prior study. Prior images reviewed side by side. FINDINGS  Left Ventricle: Left ventricular ejection fraction, by estimation, is 65 to 70%. The left ventricle has normal function. The left ventricle has no regional wall motion abnormalities. Definity contrast agent was given IV to delineate the left ventricular  endocardial borders. The left ventricular internal cavity size was normal in size. There is borderline concentric left ventricular hypertrophy. Left ventricular diastolic parameters are consistent with Grade I diastolic dysfunction (impaired relaxation). Indeterminate filling pressures. Right Ventricle: The right ventricular  size is normal. No increase in right ventricular wall thickness. Right ventricular systolic function is normal. Left Atrium: Left atrial size was normal in size. Right Atrium: Right atrial size  was normal in size. Pericardium: There is no evidence of pericardial effusion. Mitral Valve: The mitral valve is normal in structure. Mild mitral annular calcification. No evidence of mitral valve regurgitation. Tricuspid Valve: The tricuspid valve is normal in structure. Tricuspid valve regurgitation is not demonstrated. Aortic Valve: The aortic valve is tricuspid. There is mild calcification of the aortic valve. There is mild thickening of the aortic valve. Aortic valve regurgitation is not visualized. Aortic valve sclerosis is present, with no evidence of aortic valve stenosis. Pulmonic Valve: The pulmonic valve was normal in structure. Pulmonic valve regurgitation is not visualized. Aorta: The aortic root and ascending aorta are structurally normal, with no evidence of dilitation. IAS/Shunts: No atrial level shunt detected by color flow Doppler.  LEFT VENTRICLE PLAX 2D LVIDd:         4.40 cm     Diastology LVIDs:         3.70 cm     LV e' medial:    5.55 cm/s LV PW:         1.20 cm     LV E/e' medial:  12.8 LV IVS:        1.10 cm     LV e' lateral:   5.33 cm/s LVOT diam:     1.90 cm     LV E/e' lateral: 13.4 LV SV:         62 LV SV Index:   36 LVOT Area:     2.84 cm  LV Volumes (MOD) LV vol d, MOD A2C: 53.8 ml LV vol d, MOD A4C: 60.1 ml LV vol s, MOD A2C: 27.3 ml LV vol s, MOD A4C: 31.2 ml LV SV MOD A2C:     26.5 ml LV SV MOD A4C:     60.1 ml LV SV MOD BP:      28.3 ml RIGHT VENTRICLE RV S prime:     13.10 cm/s LEFT ATRIUM             Index        RIGHT ATRIUM           Index LA diam:        3.20 cm 1.87 cm/m   RA Area:     13.10 cm LA Vol (A2C):   45.6 ml 26.63 ml/m  RA Volume:   28.20 ml  16.47 ml/m LA Vol (A4C):   28.8 ml 16.82 ml/m LA Biplane Vol: 37.7 ml 22.02 ml/m  AORTIC VALVE LVOT Vmax:   126.00 cm/s LVOT  Vmean:  86.800 cm/s LVOT VTI:    0.217 m  AORTA Ao Root diam: 3.20 cm Ao Asc diam:  3.30 cm MITRAL VALVE MV Area (PHT): 3.42 cm    SHUNTS MV Decel Time: 222 msec    Systemic VTI:  0.22 m MV E velocity: 71.30 cm/s  Systemic Diam: 1.90 cm MV A velocity: 93.80 cm/s MV E/A ratio:  0.76 Mihai Croitoru MD Electronically signed by Sanda Klein MD Signature Date/Time: 05/13/2022/10:00:57 AM    Final    CT Head Wo Contrast  Result Date: 05/12/2022 CLINICAL DATA:  Head trauma, minor (Age >= 65y); Neck trauma (Age >= 65y). Fall, shoulder and pelvic fractures , prev ct head and c-spine 07/11/19 EXAM: CT HEAD WITHOUT CONTRAST CT CERVICAL SPINE WITHOUT CONTRAST TECHNIQUE: Multidetector CT imaging of the head and cervical spine was performed following the standard protocol without intravenous contrast. Multiplanar CT image reconstructions of the cervical spine were also generated. RADIATION DOSE REDUCTION: This exam  was performed according to the departmental dose-optimization program which includes automated exposure control, adjustment of the mA and/or kV according to patient size and/or use of iterative reconstruction technique. COMPARISON:  CT head and C-spine 07/11/2019 FINDINGS: CT HEAD FINDINGS BRAIN: BRAIN Cerebral ventricle sizes are concordant with the degree of cerebral volume loss. No evidence of large-territorial acute infarction. No parenchymal hemorrhage. No mass lesion. No extra-axial collection. No mass effect or midline shift. No hydrocephalus. Basilar cisterns are patent. Vascular: No hyperdense vessel. Skull: No acute fracture or focal lesion. Sinuses/Orbits: Paranasal sinuses and mastoid air cells are clear. Bilateral lens replacement. Otherwise the orbits are unremarkable. Other: None. CT CERVICAL SPINE FINDINGS Alignment: Normal. Skull base and vertebrae: Multilevel moderate severe degenerative changes with osteophyte formation, facet arthropathy, uncovertebral arthropathy. No associated severe osseous  neural foraminal or central canal stenosis. No acute fracture. No aggressive appearing focal osseous lesion or focal pathologic process. Soft tissues and spinal canal: No prevertebral fluid or swelling. No visible canal hematoma. Upper chest: Unremarkable. Other: 1 cm right thyroid gland hypodensity. Not clinically significant; no follow-up imaging recommended (ref: J Am Coll Radiol. 2015 Feb;12(2): 143-50). IMPRESSION: 1. No acute intracranial abnormality. 2. No acute displaced fracture or traumatic listhesis of the cervical spine. Electronically Signed   By: Iven Finn M.D.   On: 05/12/2022 23:09   CT Cervical Spine Wo Contrast  Result Date: 05/12/2022 CLINICAL DATA:  Head trauma, minor (Age >= 65y); Neck trauma (Age >= 65y). Fall, shoulder and pelvic fractures , prev ct head and c-spine 07/11/19 EXAM: CT HEAD WITHOUT CONTRAST CT CERVICAL SPINE WITHOUT CONTRAST TECHNIQUE: Multidetector CT imaging of the head and cervical spine was performed following the standard protocol without intravenous contrast. Multiplanar CT image reconstructions of the cervical spine were also generated. RADIATION DOSE REDUCTION: This exam was performed according to the departmental dose-optimization program which includes automated exposure control, adjustment of the mA and/or kV according to patient size and/or use of iterative reconstruction technique. COMPARISON:  CT head and C-spine 07/11/2019 FINDINGS: CT HEAD FINDINGS BRAIN: BRAIN Cerebral ventricle sizes are concordant with the degree of cerebral volume loss. No evidence of large-territorial acute infarction. No parenchymal hemorrhage. No mass lesion. No extra-axial collection. No mass effect or midline shift. No hydrocephalus. Basilar cisterns are patent. Vascular: No hyperdense vessel. Skull: No acute fracture or focal lesion. Sinuses/Orbits: Paranasal sinuses and mastoid air cells are clear. Bilateral lens replacement. Otherwise the orbits are unremarkable. Other:  None. CT CERVICAL SPINE FINDINGS Alignment: Normal. Skull base and vertebrae: Multilevel moderate severe degenerative changes with osteophyte formation, facet arthropathy, uncovertebral arthropathy. No associated severe osseous neural foraminal or central canal stenosis. No acute fracture. No aggressive appearing focal osseous lesion or focal pathologic process. Soft tissues and spinal canal: No prevertebral fluid or swelling. No visible canal hematoma. Upper chest: Unremarkable. Other: 1 cm right thyroid gland hypodensity. Not clinically significant; no follow-up imaging recommended (ref: J Am Coll Radiol. 2015 Feb;12(2): 143-50). IMPRESSION: 1. No acute intracranial abnormality. 2. No acute displaced fracture or traumatic listhesis of the cervical spine. Electronically Signed   By: Iven Finn M.D.   On: 05/12/2022 23:09   DG Chest Port 1 View  Result Date: 05/12/2022 CLINICAL DATA:  Proper evaluation, hip and shoulder fracture EXAM: PORTABLE CHEST 1 VIEW COMPARISON:  05/16/2021 FINDINGS: Single frontal view of the chest demonstrates a stable cardiac silhouette. No airspace disease, effusion, or pneumothorax. Comminuted right humeral neck fracture. IMPRESSION: 1. Comminuted right humeral neck fracture. 2. No acute intrathoracic  process. Electronically Signed   By: Randa Ngo M.D.   On: 05/12/2022 22:48   CT Shoulder Right Wo Contrast  Result Date: 05/12/2022 CLINICAL DATA:  Shoulder trauma, fracture of humerus or scapula EXAM: CT OF THE UPPER RIGHT EXTREMITY WITHOUT CONTRAST TECHNIQUE: Multidetector CT imaging of the upper right extremity was performed according to the standard protocol. RADIATION DOSE REDUCTION: This exam was performed according to the departmental dose-optimization program which includes automated exposure control, adjustment of the mA and/or kV according to patient size and/or use of iterative reconstruction technique. COMPARISON:  Same-day x-ray FINDINGS: Bones/Joint/Cartilage  Acute fracture of the right humeral head and neck with transversely oriented component through the surgical neck. Humeral neck fracture impacted by approximately 2 cm. There is approximately 1.4 cm of anterior displacement. Nondisplaced fracture component involves the greater and lesser tuberosities. There appears to be disruption of the humeral head articular surface inferiorly. Glenohumeral joint alignment maintained without dislocation. Remaining visualized osseous structures are otherwise intact. Normal alignment at the [facility] joint. Ligaments Suboptimally assessed by CT. Muscles and Tendons Enlargement of the anterior deltoid muscle adjacent to the fracture site, likely representing a component of intramuscular hemorrhage. Atrophy of the infraspinatus muscle. Soft tissues Generalized soft tissue swelling about the shoulder. No right axillary lymphadenopathy. Aortic atherosclerosis. Included right lung field is clear. IMPRESSION: 1. Acute fracture of the right humeral head and neck, as described above. 2. Enlargement of the anterior deltoid muscle adjacent to the fracture site, likely representing a component of intramuscular hemorrhage. Aortic Atherosclerosis (ICD10-I70.0). Electronically Signed   By: Davina Poke D.O.   On: 05/12/2022 19:21   CT PELVIS WO CONTRAST  Result Date: 05/12/2022 CLINICAL DATA:  Hip trauma, fracture suspected, xray done EXAM: CT PELVIS WITHOUT CONTRAST TECHNIQUE: Multidetector CT imaging of the pelvis was performed following the standard protocol without intravenous contrast. RADIATION DOSE REDUCTION: This exam was performed according to the departmental dose-optimization program which includes automated exposure control, adjustment of the mA and/or kV according to patient size and/or use of iterative reconstruction technique. COMPARISON:  03/02/2017 FINDINGS: Urinary Tract:  No abnormality visualized. Bowel: Sigmoid diverticulosis. No active inflammation or evidence of bowel  obstruction within the pelvis. Vascular/Lymphatic: Aortoiliac atherosclerosis. Vascular findings are not well assessed on noncontrast imaging. Reproductive:  No mass or other significant abnormality Other:  Small right pelvic sidewall hematoma. Musculoskeletal: Acute comminuted fracture of the right acetabulum with extension into the iliac crest. Acetabular fracture involves both the anterior and posterior columns. Femoroacetabular joint alignment is maintained without dislocation. Comminuted, displaced and angulated fracture of the mid right inferior pubic ramus. Fullness within the right obturator externus and adductor magnus muscles adjacent to the right inferior pubic ramus likely reflecting a component of intramuscular hematoma. No pubic diastasis. Sacroiliac joints intact without diastasis. Prior ORIF of the proximal right femur. No hardware complication or femur fracture. IMPRESSION: 1. Acute comminuted fracture of the right acetabulum with extension into the iliac crest. 2. Small right pelvic sidewall hematoma. 3. Right inferior pubic ramus fracture with adjacent intramuscular hematomas involving the right obturator externus and adductor magnus muscles. 4. Aortic atherosclerosis (ICD10-I70.0). Electronically Signed   By: Davina Poke D.O.   On: 05/12/2022 18:32   DG Hip Unilat W or Wo Pelvis 2-3 Views Right  Result Date: 05/12/2022 CLINICAL DATA:  Trauma, fall EXAM: DG HIP (WITH OR WITHOUT PELVIS) 2-3V RIGHT COMPARISON:  07/11/2019 FINDINGS: There is previous internal fixation of intertrochanteric fracture of right femur with intramedullary rod. There is deformity in  right inferior pubic ramus suggesting recent or old fracture. There is offset in alignment in the cortical margin in the right iliac crest suggesting possible fracture. IMPRESSION: Previous internal fixation of fracture of proximal right femur. Deformities in the right iliac crest and right inferior pubic ramus suggest possible recent  fractures. If clinically warranted, follow-up CT may be considered. Electronically Signed   By: Elmer Picker M.D.   On: 05/12/2022 17:19   DG Shoulder Right  Result Date: 05/12/2022 CLINICAL DATA:  Trauma, fall EXAM: RIGHT SHOULDER - 2+ VIEW COMPARISON:  07/14/2019 FINDINGS: There is severely comminuted fracture in the neck cough right humerus. There is superior displacement of shaft of humerus in relation to the head. In the scapular Y-view, humeral head is projecting slightly more anterior than usual. IMPRESSION: Comminuted fracture is seen in the neck of right humerus. Possible anterior subluxation/dislocation in the right shoulder. Electronically Signed   By: Elmer Picker M.D.   On: 05/12/2022 17:15     Assessment and Plan:   Preoperative Evaluation  CAD s/p PCI to LAD 2018  History of ischemic cardiomyopathy, improved EF  - According to the Revised Cardiac Risk Index, patient has a 6.0% 30-day risk of death, MI, or cardiac arrest.  - Patient does have a history of CAD-- had an MI in 03/2017 that was treated with DES to LAD. Immediately following MI, EF was reduced to 35-40%. Echocardiogram 1 month later showed EF had improved to 55-60% - Echo this admission showed EF 65-70%, no regional wall motion abnormalities, grade I diastolic dysfunction - No need for further cardiac evaluation prior to surgery  - OK to hold asa as patient has hematoma  Elevated Troponin  - hsTn 243>>26 in the setting of severe pain  - Patient denies having any chest pain, sob, palpitations.  - Echocardiogram with normal EF, no RWMAs - EKG nonischemic   - No further cardiac workup at this time   HLD  - Continue lipitor 80 mg PO daily   HTN  - BP mildly elevated in the setting of severe pain  - Continue home metoprolol   Risk Assessment/Risk Scores:            For questions or updates, please contact [place] Please consult [email or web address] for contact info under     Signed, Margie Billet, PA-C  05/13/2022 10:58 AM  I have examined the patient and reviewed assessment and plan and discussed with patient.  Agree with above as stated.    Prior LAD stent.  No angina.  Was very active prior to this unfortunate fall.  No further cardiac testing needed before surgery. Continue aggressive secondary prevention.   Elevated troponin.  Normal LV wall motion. I suspect that she had some demand ischemia.  Troponin trending down. OK to proceed with surgery.   Larae Grooms

## 2022-05-13 NOTE — Consult Note (Signed)
Reason for Consult:Polytrauma Referring Physician: Malen Gauze Time called: 1610 Time at bedside: 0930   Carrie Barnes is an 86 y.o. female.  HPI: Shaketha was putting some groceries in the car when it started to roll. She took a few quick steps to attempt to stop it but then got her feet tangled up and fell onto her right side. She had immediate right shoulder and hip pain and could not get up. She was brought to the ED where x-rays showed right proximal humerus and right acetabulum fxs and orthopedic surgery was consulted. She is retired, lives at home, is RHD, and does not use any assistive devices to ambulate.  Past Medical History:  Diagnosis Date   Cholecystitis    Coronary artery disease    a. 03/2017: 80-90% LAD stenosis (PCI/DES placement with a 2.75x16 mm Promus Premier stent). No significant stenosis along RCA or LCx.    DJD (degenerative joint disease)    GERD (gastroesophageal reflux disease)    Hyperlipidemia    Hypertension    dx s/p MI    Ischemic cardiomyopathy    Myocardial infarction ([place]) 03/17/2017   03-2017 treated at [facility]    Osteoporosis    Status post insertion of drug-eluting stent into left anterior descending (LAD) artery 03/2017    Past Surgical History:  Procedure Laterality Date   APPENDECTOMY     CESAREAN SECTION     CHOLECYSTECTOMY N/A 11/05/2017   Procedure: LAPAROSCOPIC CHOLECYSTECTOMY WITH INTRAOPERATIVE CHOLANGIOGRAM;  Surgeon: Armandina Gemma, MD;  Location: [facility];  Service: General;  Laterality: N/A;   ERCP N/A 11/11/2017   Procedure: ENDOSCOPIC RETROGRADE CHOLANGIOPANCREATOGRAPHY (ERCP);  Surgeon: Arta Silence, MD;  Location: [facility];  Service: Endoscopy;  Laterality: N/A;  possible   EUS N/A 11/11/2017   Procedure: UPPER ENDOSCOPIC ULTRASOUND (EUS) RADIAL;  Surgeon: Arta Silence, MD;  Location: [facility];  Service: Endoscopy;  Laterality: N/A;   FEMUR IM NAIL Right 07/13/2019   Procedure: INTRAMEDULLARY  (IM) RETROGRADE FEMORAL NAILING;  Surgeon: Gaynelle Arabian, MD;  Location: [facility];  Service: Orthopedics;  Laterality: Right;  90mn   LUMBAR LAMINECTOMY/ DECOMPRESSION WITH MET-RX     SHOULDER ARTHROSCOPY WITH ROTATOR CUFF REPAIR AND SUBACROMIAL DECOMPRESSION Right 09/20/2021   Procedure: SHOULDER MINI OPEN ROTATOR CUFF REPAIR AND SUBACROMIAL DECOMPRESSION WITH POSSIBLE PATCH GRAFT;  Surgeon: BSusa Day MD;  Location: [facility];  Service: Orthopedics;  Laterality: Right;  976MINS   stent  Left 03/2017   anterior descending ; drug eluting ; [facility] MI at [facility]    TONSILLECTOMY     VAGINAL HYSTERECTOMY     vaginal sling      Family History  Problem Relation Age of Onset   Heart disease Father    Breast cancer Sister    Pancreatic cancer Sister     Social History:  reports that she has quit smoking. Her smoking use included cigarettes. She has a 30.00 pack-year smoking history. She has never used smokeless tobacco. She reports current alcohol use. She reports that she does not use drugs.  Allergies: No Known Allergies  Medications: I have reviewed the patient's current medications.  Results for orders placed or performed during the hospital encounter of 05/12/22 (from the past 48 hour(s))  Basic metabolic panel     Status: Abnormal   Collection Time: 05/12/22  8:55 PM  Result Value Ref Range   Sodium 139 135 - 145 mmol/L   Potassium 3.6 3.5 - 5.1  mmol/L   Chloride 103 98 - 111 mmol/L   CO2 27 22 - 32 mmol/L   Glucose, Bld 145 (H) 70 - 99 mg/dL    Comment: Glucose reference range applies only to samples taken after fasting for at least 8 hours.   BUN 12 8 - 23 mg/dL   Creatinine, Ser 0.88 0.44 - 1.00 mg/dL   Calcium 9.1 8.9 - 10.3 mg/dL   GFR, Estimated >60 >60 mL/min    Comment: (NOTE) Calculated using the CKD-EPI Creatinine Equation (2021)    Anion gap 9 5 - 15    Comment: Performed at [facility], [place] [address], [place], [place]  [address]  CBC with Differential     Status: Abnormal   Collection Time: 05/12/22  8:55 PM  Result Value Ref Range   WBC 16.2 (H) 4.0 - 10.5 K/uL   RBC 3.40 (L) 3.87 - 5.11 MIL/uL   Hemoglobin 10.4 (L) 12.0 - 15.0 g/dL   HCT 30.1 (L) 36.0 - 46.0 %   MCV 88.5 80.0 - 100.0 fL   MCH 30.6 26.0 - 34.0 pg   MCHC 34.6 30.0 - 36.0 g/dL   RDW 12.0 11.5 - 15.5 %   Platelets 159 150 - 400 K/uL   nRBC 0.0 0.0 - 0.2 %   Neutrophils Relative % 85 %   Neutro Abs 13.7 (H) 1.7 - 7.7 K/uL   Lymphocytes Relative 8 %   Lymphs Abs 1.3 0.7 - 4.0 K/uL   Monocytes Relative 6 %   Monocytes Absolute 1.0 0.1 - 1.0 K/uL   Eosinophils Relative 0 %   Eosinophils Absolute 0.0 0.0 - 0.5 K/uL   Basophils Relative 0 %   Basophils Absolute 0.1 0.0 - 0.1 K/uL   Immature Granulocytes 1 %   Abs Immature Granulocytes 0.11 (H) 0.00 - 0.07 K/uL    Comment: Performed at [facility], [place] [address] [place], [place] [address]  CK     Status: None   Collection Time: 05/12/22  8:55 PM  Result Value Ref Range   Total CK 145 38 - 234 U/L    Comment: Performed at [facility], [place] [address], [place], [place] [address]  Magnesium     Status: Abnormal   Collection Time: 05/12/22  8:55 PM  Result Value Ref Range   Magnesium 1.6 (L) 1.7 - 2.4 mg/dL    Comment: Performed at [facility], [place] [address], [place], [place] [address]  Phosphorus     Status: None   Collection Time: 05/12/22  8:55 PM  Result Value Ref Range   Phosphorus 3.7 2.5 - 4.6 mg/dL    Comment: Performed at [facility], [place] [address], [place], [place] [address]  Hepatic function panel     Status: Abnormal   Collection Time: 05/12/22  8:55 PM  Result Value Ref Range   Total Protein 6.3 (L) 6.5 - 8.1 g/dL   Albumin 3.8 3.5 - 5.0 g/dL   AST 29 15 - 41 U/L   ALT 19 0 - 44 U/L   Alkaline Phosphatase 46 38 - 126 U/L   Total Bilirubin 1.0 0.3 - 1.2 mg/dL   Bilirubin, Direct 0.2 0.0  - 0.2 mg/dL   Indirect Bilirubin 0.8 0.3 - 0.9 mg/dL    Comment: Performed at [facility], [place] [address] [place], [place] [address]  TSH     Status: None   Collection Time: 05/12/22  8:55 PM  Result Value Ref Range   TSH 2.501 0.350 - 4.500 uIU/mL    Comment: Performed by a 3rd Generation assay with a functional sensitivity of <=0.01 uIU/mL. Performed at [facility], [place] [address] [place], [place] [address]   Troponin I (High Sensitivity)     Status: Abnormal   Collection Time: 05/12/22  8:55 PM  Result Value Ref Range   Troponin I (High Sensitivity) 243 (HH) <18 ng/L    Comment: CRITICAL RESULT CALLED TO, READ BACK BY AND VERIFIED WITH Earl Lites, RN [phone] 05/12/22 J. COLE (NOTE) Elevated high sensitivity troponin I (hsTnI) values and significant  changes across serial measurements may suggest ACS but many other  chronic and acute conditions are known to elevate hsTnI results.  Refer to the "Links" section for chest pain algorithms and additional  guidance. Performed at [facility], [place] [address], [place], [place] [address]   Vitamin B12     Status: None   Collection Time: 05/13/22 12:17 AM  Result Value Ref Range   Vitamin B-12 320 180 - 914 pg/mL    Comment: (NOTE) This assay is not validated for testing neonatal or myeloproliferative syndrome specimens for Vitamin B12 levels. Performed at [facility], [place] [address] [place], [place] [address]   Folate     Status: None   Collection Time: 05/13/22 12:17 AM  Result Value Ref Range   Folate 32.7 >5.9 ng/mL    Comment: RESULT CONFIRMED BY MANUAL DILUTION Performed at [place] [address] [place], [place] [address]   Iron and TIBC     Status: Abnormal   Collection Time: 05/13/22 12:17 AM  Result Value Ref Range   Iron 28 28 - 170 ug/dL   TIBC 277 250 - 450 ug/dL   Saturation Ratios 10 (L) 10.4 - 31.8 %   UIBC 249  ug/dL    Comment: Performed at [facility], [place] [address], [place], [place] [address]  Ferritin     Status: None   Collection Time: 05/13/22 12:17 AM  Result Value Ref Range   Ferritin 167 11 - 307 ng/mL    Comment: Performed at [facility], [place] [address], [place], [place] [address]  Reticulocytes     Status: Abnormal   Collection Time: 05/13/22 12:17 AM  Result Value Ref Range   Retic Ct Pct 2.3 0.4 - 3.1 %   RBC. 3.33 (L) 3.87 - 5.11 MIL/uL   Retic Count, Absolute 77.9 19.0 - 186.0 K/uL   Immature Retic Fract 17.4 (H) 2.3 - 15.9 %    Comment: Performed at [facility], [place] [address] [place], [place] [address]  Troponin I (High Sensitivity)     Status: Abnormal   Collection Time: 05/13/22 12:17 AM  Result Value Ref Range   Troponin I (High Sensitivity) 26 (H) <18 ng/L    Comment: DELTA CHECK NOTED (NOTE) Elevated high sensitivity troponin I (hsTnI) values and significant  changes across serial measurements may suggest ACS but many other  chronic and acute conditions are known to elevate hsTnI results.  Refer to the "Links" section for chest pain algorithms and additional  guidance. Performed at [facility], [place] [address] [place], [place] [address]   Urinalysis, Routine w reflex microscopic Urine, Catheterized     Status: Abnormal   Collection Time: 05/13/22 12:56 AM  Result Value Ref Range   Color, Urine YELLOW YELLOW  APPearance CLEAR CLEAR   Specific Gravity, Urine 1.011 1.005 - 1.030   pH 6.0 5.0 - 8.0   Glucose, UA NEGATIVE NEGATIVE mg/dL   Hgb urine dipstick NEGATIVE NEGATIVE   Bilirubin Urine NEGATIVE NEGATIVE   Ketones, ur 5 (A) NEGATIVE mg/dL   Protein, ur NEGATIVE NEGATIVE mg/dL   Nitrite NEGATIVE NEGATIVE   Leukocytes,Ua MODERATE (A) NEGATIVE   RBC / HPF 0-5 0 - 5 RBC/hpf   WBC, UA 21-50 0 - 5 WBC/hpf   Bacteria, UA RARE (A) NONE SEEN    Comment: Performed at [facility], [place] [address] [place], [place] [address]  Type and screen [place]     Status: None   Collection Time: 05/13/22  1:44 AM  Result Value Ref Range   ABO/RH(D) A POS    Antibody Screen NEG    Sample Expiration      05/16/2022,2359 Performed at [facility], [place] [address] [place], [place], [place] [address]   Prealbumin     Status: None   Collection Time: 05/13/22  1:45 AM  Result Value Ref Range   Prealbumin 20 18 - 38 mg/dL    Comment: Performed at [place] [address], [place], [place] [address]  Magnesium     Status: None   Collection Time: 05/13/22  1:45 AM  Result Value Ref Range   Magnesium 1.9 1.7 - 2.4 mg/dL    Comment: Performed at [place] [address] [place], [place] [address]  VITAMIN D 25 Hydroxy (Vit-D Deficiency, Fractures)     Status: None   Collection Time: 05/13/22  1:45 AM  Result Value Ref Range   Vit D, 25-Hydroxy 42.71 30 - 100 ng/mL    Comment: (NOTE) Vitamin D deficiency has been defined by the [place] practice guideline as a level of serum 25-OH  vitamin D less than 20 ng/mL (1,2). The Endocrine Society went on to  further define vitamin D insufficiency as a level between 21 and 29  ng/mL (2).  1. IOM (Institute of Medicine). 2010. Dietary reference intakes for  calcium and D. [place]: The [employer]. 2. Holick MF, Binkley Bishop, Bischoff-Ferrari HA, et al. Evaluation,  treatment, and prevention of vitamin D deficiency: an Endocrine  Society clinical practice guideline, JCEM. 2011 Jul; 96(7): 1911-30.  Performed at [facility], [place] [address], [place], [place] [address]     CT Head Wo Contrast  Result Date: 05/12/2022 CLINICAL DATA:  Head trauma, minor (Age >= 65y); Neck trauma (Age >= 65y). Fall, shoulder and pelvic fractures , prev ct head and c-spine 07/11/19 EXAM: CT HEAD WITHOUT CONTRAST CT CERVICAL SPINE  WITHOUT CONTRAST TECHNIQUE: Multidetector CT imaging of the head and cervical spine was performed following the standard protocol without intravenous contrast. Multiplanar CT image reconstructions of the cervical spine were also generated. RADIATION DOSE REDUCTION: This exam was performed according to the departmental dose-optimization program which includes automated exposure control, adjustment of the mA and/or kV according to patient size and/or use of iterative reconstruction technique. COMPARISON:  CT head and C-spine 07/11/2019 FINDINGS: CT HEAD FINDINGS BRAIN: BRAIN Cerebral ventricle sizes are concordant with the degree of cerebral volume loss. No evidence of large-territorial acute infarction. No parenchymal hemorrhage. No mass lesion. No extra-axial collection. No mass effect or midline shift. No hydrocephalus. Basilar cisterns are patent. Vascular: No hyperdense vessel. Skull: No acute fracture or  focal lesion. Sinuses/Orbits: Paranasal sinuses and mastoid air cells are clear. Bilateral lens replacement. Otherwise the orbits are unremarkable. Other: None. CT CERVICAL SPINE FINDINGS Alignment: Normal. Skull base and vertebrae: Multilevel moderate severe degenerative changes with osteophyte formation, facet arthropathy, uncovertebral arthropathy. No associated severe osseous neural foraminal or central canal stenosis. No acute fracture. No aggressive appearing focal osseous lesion or focal pathologic process. Soft tissues and spinal canal: No prevertebral fluid or swelling. No visible canal hematoma. Upper chest: Unremarkable. Other: 1 cm right thyroid gland hypodensity. Not clinically significant; no follow-up imaging recommended (ref: J Am Coll Radiol. 2015 Feb;12(2): 143-50). IMPRESSION: 1. No acute intracranial abnormality. 2. No acute displaced fracture or traumatic listhesis of the cervical spine. Electronically Signed   By: Iven Finn M.D.   On: 05/12/2022 23:09   CT Cervical Spine Wo  Contrast  Result Date: 05/12/2022 CLINICAL DATA:  Head trauma, minor (Age >= 65y); Neck trauma (Age >= 65y). Fall, shoulder and pelvic fractures , prev ct head and c-spine 07/11/19 EXAM: CT HEAD WITHOUT CONTRAST CT CERVICAL SPINE WITHOUT CONTRAST TECHNIQUE: Multidetector CT imaging of the head and cervical spine was performed following the standard protocol without intravenous contrast. Multiplanar CT image reconstructions of the cervical spine were also generated. RADIATION DOSE REDUCTION: This exam was performed according to the departmental dose-optimization program which includes automated exposure control, adjustment of the mA and/or kV according to patient size and/or use of iterative reconstruction technique. COMPARISON:  CT head and C-spine 07/11/2019 FINDINGS: CT HEAD FINDINGS BRAIN: BRAIN Cerebral ventricle sizes are concordant with the degree of cerebral volume loss. No evidence of large-territorial acute infarction. No parenchymal hemorrhage. No mass lesion. No extra-axial collection. No mass effect or midline shift. No hydrocephalus. Basilar cisterns are patent. Vascular: No hyperdense vessel. Skull: No acute fracture or focal lesion. Sinuses/Orbits: Paranasal sinuses and mastoid air cells are clear. Bilateral lens replacement. Otherwise the orbits are unremarkable. Other: None. CT CERVICAL SPINE FINDINGS Alignment: Normal. Skull base and vertebrae: Multilevel moderate severe degenerative changes with osteophyte formation, facet arthropathy, uncovertebral arthropathy. No associated severe osseous neural foraminal or central canal stenosis. No acute fracture. No aggressive appearing focal osseous lesion or focal pathologic process. Soft tissues and spinal canal: No prevertebral fluid or swelling. No visible canal hematoma. Upper chest: Unremarkable. Other: 1 cm right thyroid gland hypodensity. Not clinically significant; no follow-up imaging recommended (ref: J Am Coll Radiol. 2015 Feb;12(2): 143-50).  IMPRESSION: 1. No acute intracranial abnormality. 2. No acute displaced fracture or traumatic listhesis of the cervical spine. Electronically Signed   By: Iven Finn M.D.   On: 05/12/2022 23:09   DG Chest Port 1 View  Result Date: 05/12/2022 CLINICAL DATA:  Proper evaluation, hip and shoulder fracture EXAM: PORTABLE CHEST 1 VIEW COMPARISON:  05/16/2021 FINDINGS: Single frontal view of the chest demonstrates a stable cardiac silhouette. No airspace disease, effusion, or pneumothorax. Comminuted right humeral neck fracture. IMPRESSION: 1. Comminuted right humeral neck fracture. 2. No acute intrathoracic process. Electronically Signed   By: Randa Ngo M.D.   On: 05/12/2022 22:48   CT Shoulder Right Wo Contrast  Result Date: 05/12/2022 CLINICAL DATA:  Shoulder trauma, fracture of humerus or scapula EXAM: CT OF THE UPPER RIGHT EXTREMITY WITHOUT CONTRAST TECHNIQUE: Multidetector CT imaging of the upper right extremity was performed according to the standard protocol. RADIATION DOSE REDUCTION: This exam was performed according to the departmental dose-optimization program which includes automated exposure control, adjustment of the mA and/or kV according to patient size and/or  use of iterative reconstruction technique. COMPARISON:  Same-day x-ray FINDINGS: Bones/Joint/Cartilage Acute fracture of the right humeral head and neck with transversely oriented component through the surgical neck. Humeral neck fracture impacted by approximately 2 cm. There is approximately 1.4 cm of anterior displacement. Nondisplaced fracture component involves the greater and lesser tuberosities. There appears to be disruption of the humeral head articular surface inferiorly. Glenohumeral joint alignment maintained without dislocation. Remaining visualized osseous structures are otherwise intact. Normal alignment at the [facility] joint. Ligaments Suboptimally assessed by CT. Muscles and Tendons Enlargement of the anterior deltoid  muscle adjacent to the fracture site, likely representing a component of intramuscular hemorrhage. Atrophy of the infraspinatus muscle. Soft tissues Generalized soft tissue swelling about the shoulder. No right axillary lymphadenopathy. Aortic atherosclerosis. Included right lung field is clear. IMPRESSION: 1. Acute fracture of the right humeral head and neck, as described above. 2. Enlargement of the anterior deltoid muscle adjacent to the fracture site, likely representing a component of intramuscular hemorrhage. Aortic Atherosclerosis (ICD10-I70.0). Electronically Signed   By: Davina Poke D.O.   On: 05/12/2022 19:21   CT PELVIS WO CONTRAST  Result Date: 05/12/2022 CLINICAL DATA:  Hip trauma, fracture suspected, xray done EXAM: CT PELVIS WITHOUT CONTRAST TECHNIQUE: Multidetector CT imaging of the pelvis was performed following the standard protocol without intravenous contrast. RADIATION DOSE REDUCTION: This exam was performed according to the departmental dose-optimization program which includes automated exposure control, adjustment of the mA and/or kV according to patient size and/or use of iterative reconstruction technique. COMPARISON:  03/02/2017 FINDINGS: Urinary Tract:  No abnormality visualized. Bowel: Sigmoid diverticulosis. No active inflammation or evidence of bowel obstruction within the pelvis. Vascular/Lymphatic: Aortoiliac atherosclerosis. Vascular findings are not well assessed on noncontrast imaging. Reproductive:  No mass or other significant abnormality Other:  Small right pelvic sidewall hematoma. Musculoskeletal: Acute comminuted fracture of the right acetabulum with extension into the iliac crest. Acetabular fracture involves both the anterior and posterior columns. Femoroacetabular joint alignment is maintained without dislocation. Comminuted, displaced and angulated fracture of the mid right inferior pubic ramus. Fullness within the right obturator externus and adductor magnus  muscles adjacent to the right inferior pubic ramus likely reflecting a component of intramuscular hematoma. No pubic diastasis. Sacroiliac joints intact without diastasis. Prior ORIF of the proximal right femur. No hardware complication or femur fracture. IMPRESSION: 1. Acute comminuted fracture of the right acetabulum with extension into the iliac crest. 2. Small right pelvic sidewall hematoma. 3. Right inferior pubic ramus fracture with adjacent intramuscular hematomas involving the right obturator externus and adductor magnus muscles. 4. Aortic atherosclerosis (ICD10-I70.0). Electronically Signed   By: Davina Poke D.O.   On: 05/12/2022 18:32   DG Hip Unilat W or Wo Pelvis 2-3 Views Right  Result Date: 05/12/2022 CLINICAL DATA:  Trauma, fall EXAM: DG HIP (WITH OR WITHOUT PELVIS) 2-3V RIGHT COMPARISON:  07/11/2019 FINDINGS: There is previous internal fixation of intertrochanteric fracture of right femur with intramedullary rod. There is deformity in right inferior pubic ramus suggesting recent or old fracture. There is offset in alignment in the cortical margin in the right iliac crest suggesting possible fracture. IMPRESSION: Previous internal fixation of fracture of proximal right femur. Deformities in the right iliac crest and right inferior pubic ramus suggest possible recent fractures. If clinically warranted, follow-up CT may be considered. Electronically Signed   By: Elmer Picker M.D.   On: 05/12/2022 17:19   DG Shoulder Right  Result Date: 05/12/2022 CLINICAL DATA:  Trauma, fall EXAM: RIGHT SHOULDER -  2+ VIEW COMPARISON:  07/14/2019 FINDINGS: There is severely comminuted fracture in the neck cough right humerus. There is superior displacement of shaft of humerus in relation to the head. In the scapular Y-view, humeral head is projecting slightly more anterior than usual. IMPRESSION: Comminuted fracture is seen in the neck of right humerus. Possible anterior subluxation/dislocation in the  right shoulder. Electronically Signed   By: Elmer Picker M.D.   On: 05/12/2022 17:15    Review of Systems  HENT:  Negative for ear discharge, ear pain, hearing loss and tinnitus.   Eyes:  Negative for photophobia and pain.  Respiratory:  Negative for cough and shortness of breath.   Cardiovascular:  Negative for chest pain.  Gastrointestinal:  Negative for abdominal pain, nausea and vomiting.  Genitourinary:  Negative for dysuria, flank pain, frequency and urgency.  Musculoskeletal:  Positive for arthralgias (Right shoulder and hip). Negative for back pain, myalgias and neck pain.  Neurological:  Negative for dizziness and headaches.  Hematological:  Does not bruise/bleed easily.  Psychiatric/Behavioral:  The patient is not nervous/anxious.    Blood pressure 135/68, pulse (!) 101, temperature 98.9 F (37.2 C), temperature source Oral, resp. rate 20, height '5\' 2"'  (1.575 m), weight 70 kg, SpO2 98 %. Physical Exam Constitutional:      General: She is not in acute distress.    Appearance: She is well-developed. She is not diaphoretic.  HENT:     Head: Normocephalic and atraumatic.  Eyes:     General: No scleral icterus.       Right eye: No discharge.        Left eye: No discharge.     Conjunctiva/sclera: Conjunctivae normal.  Cardiovascular:     Rate and Rhythm: Normal rate and regular rhythm.  Pulmonary:     Effort: Pulmonary effort is normal. No respiratory distress.  Musculoskeletal:     Cervical back: Normal range of motion.     Comments: Right shoulder, elbow, wrist, digits- no skin wounds, mod TTP shoulder, no instability, no blocks to motion  Sens  Ax/R/M/U intact  Mot   Ax/ R/ PIN/ M/ AIN/ U intact  Rad 2+  RLE No traumatic wounds, ecchymosis, or rash  Mild TTP hip  No knee or ankle effusion  Knee stable to varus/ valgus and anterior/posterior stress  Sens DPN, SPN, TN intact  Motor EHL, ext, flex, evers 5/5  DP 1+, PT 1+, No significant edema  Skin:     General: Skin is warm and dry.  Neurological:     Mental Status: She is alert.  Psychiatric:        Mood and Affect: Mood normal.        Behavior: Behavior normal.     Assessment/Plan: Right acetabulum fx -- Plan ORIF tomorrow with Dr. Doreatha Martin. Please keep NPO after MN. Right proximal humerus fx -- Likely non-op vs arthroplasty. Will have shoulder specialists review case. Would love to get it done same time tomorrow but will likely need second trip to [place] 2/2 scheduling. Multiple medical problems including CAD with last cardiac stenting in 2018, GERD, HLD, and HTN -- per primary service    Lisette Abu, PA-C Orthopedic Surgery [phone] 05/13/2022, 9:39 AM

## 2022-05-14 ENCOUNTER — Encounter (HOSPITAL_COMMUNITY)
Admission: EM | Disposition: A | Discharge status: Skilled Nursing Facility | Payer: Self-pay | Source: Home / Self Care | Attending: Internal Medicine

## 2022-05-14 ENCOUNTER — Inpatient Hospital Stay (HOSPITAL_COMMUNITY): Payer: Medicare Other | Admitting: Anesthesiology

## 2022-05-14 ENCOUNTER — Inpatient Hospital Stay (HOSPITAL_COMMUNITY): Payer: Medicare Other

## 2022-05-14 ENCOUNTER — Encounter (HOSPITAL_COMMUNITY): Payer: Self-pay | Admitting: Internal Medicine

## 2022-05-14 ENCOUNTER — Other Ambulatory Visit: Payer: Self-pay

## 2022-05-14 DIAGNOSIS — Z87891 Personal history of nicotine dependence: Secondary | ICD-10-CM

## 2022-05-14 DIAGNOSIS — S32431A Displaced fracture of anterior column [iliopubic] of right acetabulum, initial encounter for closed fracture: Secondary | ICD-10-CM | POA: Diagnosis not present

## 2022-05-14 DIAGNOSIS — I2510 Atherosclerotic heart disease of native coronary artery without angina pectoris: Secondary | ICD-10-CM

## 2022-05-14 DIAGNOSIS — S42201A Unspecified fracture of upper end of right humerus, initial encounter for closed fracture: Secondary | ICD-10-CM | POA: Diagnosis not present

## 2022-05-14 DIAGNOSIS — S32511A Fracture of superior rim of right pubis, initial encounter for closed fracture: Secondary | ICD-10-CM | POA: Diagnosis not present

## 2022-05-14 DIAGNOSIS — S32441A Displaced fracture of posterior column [ilioischial] of right acetabulum, initial encounter for closed fracture: Secondary | ICD-10-CM

## 2022-05-14 DIAGNOSIS — S32401A Unspecified fracture of right acetabulum, initial encounter for closed fracture: Secondary | ICD-10-CM | POA: Diagnosis not present

## 2022-05-14 DIAGNOSIS — I252 Old myocardial infarction: Secondary | ICD-10-CM

## 2022-05-14 DIAGNOSIS — I10 Essential (primary) hypertension: Secondary | ICD-10-CM

## 2022-05-14 DIAGNOSIS — W19XXXA Unspecified fall, initial encounter: Secondary | ICD-10-CM | POA: Diagnosis not present

## 2022-05-14 HISTORY — PX: OPEN REDUCTION INTERNAL FIXATION ACETABULAR FRACTURE STOPPA: SHX6831

## 2022-05-14 LAB — CBC
HCT: 26.5 % — ABNORMAL LOW (ref 36.0–46.0)
Hemoglobin: 9.2 g/dL — ABNORMAL LOW (ref 12.0–15.0)
MCH: 31.2 pg (ref 26.0–34.0)
MCHC: 34.7 g/dL (ref 30.0–36.0)
MCV: 89.8 fL (ref 80.0–100.0)
Platelets: 102 10*3/uL — ABNORMAL LOW (ref 150–400)
RBC: 2.95 MIL/uL — ABNORMAL LOW (ref 3.87–5.11)
RDW: 12.5 % (ref 11.5–15.5)
WBC: 9 10*3/uL (ref 4.0–10.5)
nRBC: 0 % (ref 0.0–0.2)

## 2022-05-14 LAB — SURGICAL PCR SCREEN
MRSA, PCR: NEGATIVE
Staphylococcus aureus: NEGATIVE

## 2022-05-14 LAB — BASIC METABOLIC PANEL
Anion gap: 8 (ref 5–15)
BUN: 9 mg/dL (ref 8–23)
CO2: 24 mmol/L (ref 22–32)
Calcium: 8.4 mg/dL — ABNORMAL LOW (ref 8.9–10.3)
Chloride: 101 mmol/L (ref 98–111)
Creatinine, Ser: 0.75 mg/dL (ref 0.44–1.00)
GFR, Estimated: >60 mL/min (ref >60)
Glucose, Bld: 131 mg/dL — ABNORMAL HIGH (ref 70–99)
Potassium: 4 mmol/L (ref 3.5–5.1)
Sodium: 133 mmol/L — ABNORMAL LOW (ref 135–145)

## 2022-05-14 LAB — PREPARE RBC (CROSSMATCH)

## 2022-05-14 LAB — MAGNESIUM: Magnesium: 1.8 mg/dL (ref 1.7–2.4)

## 2022-05-14 SURGERY — OPEN REDUCTION INTERNAL FIXATION ACETABULAR FRACTURE STOPPA
Anesthesia: General | Site: Hip | Laterality: Right

## 2022-05-14 MED ORDER — LIDOCAINE 2% (20 MG/ML) 5 ML SYRINGE
INTRAMUSCULAR | Status: AC
  Filled 2022-05-14: qty 5

## 2022-05-14 MED ORDER — ONDANSETRON HCL 4 MG/2ML IJ SOLN: 4.0000 mg | Freq: Once | INTRAMUSCULAR | Status: DC | PRN

## 2022-05-14 MED ORDER — METOCLOPRAMIDE HCL 5 MG PO TABS: 5.0000 mg | ORAL_TABLET | Freq: Three times a day (TID) | ORAL | Status: DC | PRN

## 2022-05-14 MED ORDER — ONDANSETRON HCL 4 MG/2ML IJ SOLN
INTRAMUSCULAR | Status: AC
  Filled 2022-05-14: qty 2

## 2022-05-14 MED ORDER — DEXAMETHASONE SODIUM PHOSPHATE 10 MG/ML IJ SOLN
INTRAMUSCULAR | Status: DC | PRN
  Administered 2022-05-14: 4 mg via INTRAVENOUS

## 2022-05-14 MED ORDER — LACTATED RINGERS IV SOLN: INTRAVENOUS | Status: DC

## 2022-05-14 MED ORDER — METOCLOPRAMIDE HCL 5 MG/ML IJ SOLN: 5.0000 mg | Freq: Three times a day (TID) | INTRAMUSCULAR | Status: DC | PRN

## 2022-05-14 MED ORDER — ACETAMINOPHEN 500 MG PO TABS
1000.0000 mg | ORAL_TABLET | Freq: Four times a day (QID) | ORAL | Status: DC
  Administered 2022-05-14 – 2022-05-15 (×2): 1000 mg via ORAL
  Filled 2022-05-14 (×3): qty 2

## 2022-05-14 MED ORDER — FENTANYL CITRATE (PF) 250 MCG/5ML IJ SOLN
INTRAMUSCULAR | Status: AC
  Filled 2022-05-14: qty 5

## 2022-05-14 MED ORDER — MORPHINE SULFATE (PF) 2 MG/ML IV SOLN
1.0000 mg | INTRAVENOUS | Status: DC | PRN
  Administered 2022-05-14: 1 mg via INTRAVENOUS
  Administered 2022-05-15 – 2022-05-16 (×4): 2 mg via INTRAVENOUS
  Filled 2022-05-14 (×5): qty 1

## 2022-05-14 MED ORDER — VANCOMYCIN HCL 1 G IV SOLR
INTRAVENOUS | Status: DC | PRN
  Administered 2022-05-14: 1000 mg

## 2022-05-14 MED ORDER — OXYCODONE HCL 5 MG PO TABS
5.0000 mg | ORAL_TABLET | ORAL | Status: DC | PRN
  Administered 2022-05-16 – 2022-05-17 (×2): 10 mg via ORAL
  Administered 2022-05-17 – 2022-05-19 (×3): 5 mg via ORAL
  Administered 2022-05-20 (×2): 10 mg via ORAL
  Administered 2022-05-20: 5 mg via ORAL
  Administered 2022-05-21: 10 mg via ORAL
  Filled 2022-05-14 (×2): qty 1
  Filled 2022-05-14 (×4): qty 2
  Filled 2022-05-14 (×2): qty 1
  Filled 2022-05-14: qty 2

## 2022-05-14 MED ORDER — ONDANSETRON HCL 4 MG/2ML IJ SOLN
INTRAMUSCULAR | Status: DC | PRN
  Administered 2022-05-14: 4 mg via INTRAVENOUS

## 2022-05-14 MED ORDER — LIDOCAINE 2% (20 MG/ML) 5 ML SYRINGE
INTRAMUSCULAR | Status: DC | PRN
  Administered 2022-05-14: 80 mg via INTRAVENOUS

## 2022-05-14 MED ORDER — DEXAMETHASONE SODIUM PHOSPHATE 10 MG/ML IJ SOLN
INTRAMUSCULAR | Status: AC
  Filled 2022-05-14: qty 1

## 2022-05-14 MED ORDER — FENTANYL CITRATE (PF) 250 MCG/5ML IJ SOLN
INTRAMUSCULAR | Status: DC | PRN
  Administered 2022-05-14 (×3): 50 ug via INTRAVENOUS

## 2022-05-14 MED ORDER — ORAL CARE MOUTH RINSE: 15.0000 mL | Freq: Once | OROMUCOSAL | Status: AC

## 2022-05-14 MED ORDER — SODIUM CHLORIDE 0.9 % IV SOLN: 10.0000 mL/h | Freq: Once | INTRAVENOUS | Status: DC

## 2022-05-14 MED ORDER — PROPOFOL 10 MG/ML IV BOLUS
INTRAVENOUS | Status: DC | PRN
  Administered 2022-05-14: 100 mg via INTRAVENOUS

## 2022-05-14 MED ORDER — ACETAMINOPHEN 10 MG/ML IV SOLN
INTRAVENOUS | Status: AC
  Filled 2022-05-14: qty 100

## 2022-05-14 MED ORDER — CHLORHEXIDINE GLUCONATE 0.12 % MT SOLN
15.0000 mL | Freq: Once | OROMUCOSAL | Status: AC
  Filled 2022-05-14: qty 15

## 2022-05-14 MED ORDER — FENTANYL CITRATE (PF) 100 MCG/2ML IJ SOLN: 25.0000 ug | INTRAMUSCULAR | Status: DC | PRN

## 2022-05-14 MED ORDER — PHENYLEPHRINE 80 MCG/ML (10ML) SYRINGE FOR IV PUSH (FOR BLOOD PRESSURE SUPPORT)
PREFILLED_SYRINGE | INTRAVENOUS | Status: DC | PRN
  Administered 2022-05-14: 160 ug via INTRAVENOUS
  Administered 2022-05-14 (×3): 80 ug via INTRAVENOUS
  Administered 2022-05-14: 160 ug via INTRAVENOUS
  Administered 2022-05-14: 80 ug via INTRAVENOUS
  Administered 2022-05-14: 160 ug via INTRAVENOUS

## 2022-05-14 MED ORDER — TRANEXAMIC ACID-NACL 1000-0.7 MG/100ML-% IV SOLN
1000.0000 mg | Freq: Once | INTRAVENOUS | Status: AC
  Administered 2022-05-14: 1000 mg via INTRAVENOUS
  Filled 2022-05-14: qty 100

## 2022-05-14 MED ORDER — ROCURONIUM BROMIDE 10 MG/ML (PF) SYRINGE
PREFILLED_SYRINGE | INTRAVENOUS | Status: AC
  Filled 2022-05-14: qty 10

## 2022-05-14 MED ORDER — SODIUM CHLORIDE 0.9 % IV SOLN: INTRAVENOUS | Status: DC | PRN

## 2022-05-14 MED ORDER — PHENYLEPHRINE 80 MCG/ML (10ML) SYRINGE FOR IV PUSH (FOR BLOOD PRESSURE SUPPORT)
PREFILLED_SYRINGE | INTRAVENOUS | Status: AC
  Filled 2022-05-14: qty 10

## 2022-05-14 MED ORDER — DOCUSATE SODIUM 100 MG PO CAPS
100.0000 mg | ORAL_CAPSULE | Freq: Two times a day (BID) | ORAL | Status: DC
  Administered 2022-05-14 – 2022-05-21 (×12): 100 mg via ORAL
  Filled 2022-05-14 (×12): qty 1

## 2022-05-14 MED ORDER — ACETAMINOPHEN 10 MG/ML IV SOLN
INTRAVENOUS | Status: DC | PRN
  Administered 2022-05-14: 1000 mg via INTRAVENOUS

## 2022-05-14 MED ORDER — CHLORHEXIDINE GLUCONATE 0.12 % MT SOLN
OROMUCOSAL | Status: AC
  Administered 2022-05-14: 15 mL via OROMUCOSAL
  Filled 2022-05-14: qty 15

## 2022-05-14 MED ORDER — ONDANSETRON HCL 4 MG/2ML IJ SOLN: 4.0000 mg | Freq: Four times a day (QID) | INTRAMUSCULAR | Status: DC | PRN

## 2022-05-14 MED ORDER — PHENYLEPHRINE HCL-NACL 20-0.9 MG/250ML-% IV SOLN
INTRAVENOUS | Status: DC | PRN
  Administered 2022-05-14: 100 ug/min via INTRAVENOUS

## 2022-05-14 MED ORDER — VANCOMYCIN HCL 1000 MG IV SOLR
INTRAVENOUS | Status: AC
  Filled 2022-05-14: qty 20

## 2022-05-14 MED ORDER — CEFAZOLIN SODIUM-DEXTROSE 2-4 GM/100ML-% IV SOLN
2.0000 g | Freq: Three times a day (TID) | INTRAVENOUS | Status: AC
  Administered 2022-05-14 – 2022-05-15 (×3): 2 g via INTRAVENOUS
  Filled 2022-05-14 (×3): qty 100

## 2022-05-14 MED ORDER — SUGAMMADEX SODIUM 200 MG/2ML IV SOLN
INTRAVENOUS | Status: DC | PRN
  Administered 2022-05-14: 200 mg via INTRAVENOUS

## 2022-05-14 MED ORDER — 0.9 % SODIUM CHLORIDE (POUR BTL) OPTIME
TOPICAL | Status: DC | PRN
  Administered 2022-05-14: 1000 mL

## 2022-05-14 MED ORDER — PROPOFOL 10 MG/ML IV BOLUS
INTRAVENOUS | Status: AC
  Filled 2022-05-14: qty 20

## 2022-05-14 MED ORDER — POLYETHYLENE GLYCOL 3350 17 G PO PACK
17.0000 g | PACK | Freq: Every day | ORAL | Status: DC | PRN
  Administered 2022-05-16 – 2022-05-17 (×2): 17 g via ORAL
  Filled 2022-05-14 (×2): qty 1

## 2022-05-14 MED ORDER — ROCURONIUM BROMIDE 10 MG/ML (PF) SYRINGE
PREFILLED_SYRINGE | INTRAVENOUS | Status: DC | PRN
  Administered 2022-05-14: 50 mg via INTRAVENOUS

## 2022-05-14 MED ORDER — VASOPRESSIN 20 UNIT/ML IV SOLN
INTRAVENOUS | Status: AC
  Filled 2022-05-14: qty 1

## 2022-05-14 MED ORDER — ONDANSETRON HCL 4 MG PO TABS: 4.0000 mg | ORAL_TABLET | Freq: Four times a day (QID) | ORAL | Status: DC | PRN

## 2022-05-14 SURGICAL SUPPLY — 72 items
ADH SKN CLS APL DERMABOND .7 (GAUZE/BANDAGES/DRESSINGS) ×1
APL PRP STRL LF DISP 70% ISPRP (MISCELLANEOUS) ×1
APPLIER CLIP 11 MED OPEN (CLIP) ×1
APR CLP MED 11 20 MLT OPN (CLIP) ×1
BAG COUNTER SPONGE SURGICOUNT (BAG) ×1 IMPLANT
BAG SPNG CNTER NS LX DISP (BAG) ×1
BIT DRILL AO MATTA 2.5MX230M (BIT) IMPLANT
BLADE CLIPPER SURG (BLADE) IMPLANT
BRUSH SCRUB EZ PLAIN DRY (MISCELLANEOUS) ×2 IMPLANT
CHLORAPREP W/TINT 26 (MISCELLANEOUS) ×1 IMPLANT
CLIP APPLIE 11 MED OPEN (CLIP) IMPLANT
COVER SURGICAL LIGHT HANDLE (MISCELLANEOUS) ×1 IMPLANT
DERMABOND ADVANCED .7 DNX12 (GAUZE/BANDAGES/DRESSINGS) IMPLANT
DRAPE C-ARM 42X72 X-RAY (DRAPES) ×1 IMPLANT
DRAPE C-ARMOR (DRAPES) ×1 IMPLANT
DRAPE INCISE IOBAN 66X45 STRL (DRAPES) ×1 IMPLANT
DRAPE ORTHO SPLIT 77X108 STRL (DRAPES) ×1
DRAPE SURG ORHT 6 SPLT 77X108 (DRAPES) ×1 IMPLANT
DRAPE U-SHAPE 47X51 STRL (DRAPES) ×1 IMPLANT
DRILL BIT AO MATTA 2.5MX230M (BIT) ×1
DRSG MEPILEX BORDER 4X12 (GAUZE/BANDAGES/DRESSINGS) IMPLANT
DRSG MEPILEX BORDER 4X8 (GAUZE/BANDAGES/DRESSINGS) IMPLANT
DRSG MEPILEX POST OP 4X8 (GAUZE/BANDAGES/DRESSINGS) IMPLANT
ELECT BLADE 4.0 EZ CLEAN MEGAD (MISCELLANEOUS) ×1
ELECT BLADE 6.5 EXT (BLADE) ×1 IMPLANT
ELECT REM PT RETURN 9FT ADLT (ELECTROSURGICAL) ×1
ELECTRODE BLDE 4.0 EZ CLN MEGD (MISCELLANEOUS) IMPLANT
ELECTRODE REM PT RTRN 9FT ADLT (ELECTROSURGICAL) ×1 IMPLANT
GLOVE BIO SURGEON STRL SZ 6.5 (GLOVE) ×3 IMPLANT
GLOVE BIO SURGEON STRL SZ7.5 (GLOVE) ×4 IMPLANT
GLOVE BIOGEL PI IND STRL 6.5 (GLOVE) ×1 IMPLANT
GLOVE BIOGEL PI IND STRL 7.5 (GLOVE) ×1 IMPLANT
GOWN STRL REUS W/ TWL LRG LVL3 (GOWN DISPOSABLE) ×3 IMPLANT
GOWN STRL REUS W/TWL LRG LVL3 (GOWN DISPOSABLE) ×3
HANDPIECE INTERPULSE COAX TIP (DISPOSABLE) ×1
K-WIRE TROCAR NS 2.5X285 (MISCELLANEOUS) ×2
KIT BASIN OR (CUSTOM PROCEDURE TRAY) ×1 IMPLANT
KIT TURNOVER KIT B (KITS) ×1 IMPLANT
KWIRE TROCAR NS 2.5X285 (MISCELLANEOUS) IMPLANT
MANIFOLD NEPTUNE II (INSTRUMENTS) ×1 IMPLANT
NS IRRIG 1000ML POUR BTL (IV SOLUTION) ×1 IMPLANT
PACK TOTAL JOINT (CUSTOM PROCEDURE TRAY) ×1 IMPLANT
PACK UNIVERSAL I (CUSTOM PROCEDURE TRAY) ×1 IMPLANT
PAD ARMBOARD 7.5X6 YLW CONV (MISCELLANEOUS) ×2 IMPLANT
PLATE SUPRAPECTINEAL PELVIS (Plate) IMPLANT
RETRIEVER SUT HEWSON (MISCELLANEOUS) IMPLANT
SCREW CORTEX ST MATTA 3.5X26MM (Screw) IMPLANT
SCREW CORTEX ST MATTA 3.5X34MM (Screw) IMPLANT
SCREW CORTEX ST MATTA 3.5X36MM (Screw) IMPLANT
SCREW CORTEX ST MATTA 3.5X38M (Screw) IMPLANT
SCREW CORTEX ST MATTA 3.5X40MM (Screw) IMPLANT
SCREW CORTEX ST MATTA 3.5X45MM (Screw) IMPLANT
SCREW CORTEX ST MATTA 3.5X50MM (Screw) IMPLANT
SET HNDPC FAN SPRY TIP SCT (DISPOSABLE) ×1 IMPLANT
SLING ARM IMMOBILIZER LRG (SOFTGOODS) IMPLANT
SPONGE T-LAP 18X18 ~~LOC~~+RFID (SPONGE) IMPLANT
STAPLER VISISTAT 35W (STAPLE) ×1 IMPLANT
SUCTION FRAZIER HANDLE 10FR (MISCELLANEOUS)
SUCTION TUBE FRAZIER 10FR DISP (MISCELLANEOUS) IMPLANT
SUT FIBERWIRE #2 38 T-5 BLUE (SUTURE)
SUT MNCRL AB 3-0 PS2 18 (SUTURE) ×1 IMPLANT
SUT MON AB 2-0 CT1 36 (SUTURE) ×1 IMPLANT
SUT VIC AB 0 CT1 27 (SUTURE) ×1
SUT VIC AB 0 CT1 27XBRD ANBCTR (SUTURE) ×2 IMPLANT
SUT VIC AB 1 CT1 27 (SUTURE)
SUT VIC AB 1 CT1 27XBRD ANBCTR (SUTURE) ×1 IMPLANT
SUT VIC AB 2-0 CT1 27 (SUTURE) ×2
SUT VIC AB 2-0 CT1 TAPERPNT 27 (SUTURE) ×1 IMPLANT
SUTURE FIBERWR #2 38 T-5 BLUE (SUTURE) IMPLANT
TOWEL GREEN STERILE (TOWEL DISPOSABLE) ×2 IMPLANT
TRAY FOLEY MTR SLVR 16FR STAT (SET/KITS/TRAYS/PACK) IMPLANT
WATER STERILE IRR 1000ML POUR (IV SOLUTION) ×1 IMPLANT

## 2022-05-14 NOTE — Anesthesia Preprocedure Evaluation (Addendum)
Anesthesia Evaluation  Patient identified by MRN, date of birth, ID band Patient awake    Reviewed: Allergy & Precautions, NPO status , Patient's Chart, lab work & pertinent test results, reviewed documented beta blocker date and time   Airway Mallampati: I  TM Distance: >3 FB Neck ROM: Full    Dental  (+) Edentulous Upper, Edentulous Lower, Dental Advisory Given   Pulmonary former smoker   Pulmonary exam normal        Cardiovascular hypertension, Pt. on home beta blockers + CAD, + Past MI and + Cardiac Stents  Normal cardiovascular exam  EKG: 05/13/2021 Rate 76 bpm  Sinus rhythm  No significant change since last tracing  CV: Echo 07/12/2019 1. Left ventricular ejection fraction, by visual estimation, is 60 to  65%. The left ventricle has normal function. There is no left ventricular  hypertrophy.  2. Global right ventricle has normal systolic function.The right  ventricular size is mildly enlarged. No increase in right ventricular wall  thickness.   5. Mild mitral annular calcification.  6. The mitral valve is normal in structure. Trace mitral valve  regurgitation. No evidence of mitral stenosis.  8. The aortic valve is tricuspid. Aortic valve regurgitation is not  visualized. No evidence of aortic valve sclerosis or stenosis.  10. Moderately elevated pulmonary artery systolic pressure.   03/2017: 80-90% LAD stenosis (PCI/DES placement with a 2.75x16 mm Promus Premier stent). No significant stenosis along RCA or LCx.    Neuro/Psych  PSYCHIATRIC DISORDERS  Depression    negative neurological ROS  negative psych ROS   GI/Hepatic Neg liver ROS,GERD  Medicated and Controlled,,  Endo/Other  negative endocrine ROS    Renal/GU negative Renal ROS  negative genitourinary   Musculoskeletal  (+) Arthritis , Osteoarthritis,    Abdominal   Peds negative pediatric ROS (+)  Hematology negative hematology ROS (+)  Blood dyscrasia, anemia   Anesthesia Other Findings   Reproductive/Obstetrics negative OB ROS                             Anesthesia Physical Anesthesia Plan  ASA: 3  Anesthesia Plan: General   Post-op Pain Management:    Induction: Intravenous  PONV Risk Score and Plan: 3 and Ondansetron and Treatment may vary due to age or medical condition  Airway Management Planned: Oral ETT  Additional Equipment: None  Intra-op Plan:   Post-operative Plan: Extubation in OR  Informed Consent: I have reviewed the patients History and Physical, chart, labs and discussed the procedure including the risks, benefits and alternatives for the proposed anesthesia with the patient or authorized representative who has indicated his/her understanding and acceptance.     Dental advisory given  Plan Discussed with:   Anesthesia Plan Comments: (See PAT note 09/13/2021, Konrad Felix Ward, PA-C 86 y.o. former smoker with h/o GERD, HTN, CAD (DES 2018), ischemic cardiomyopathy, right rotator cuff tear scheduled for above procedure 09/20/2021 with Dr. Susa Day.  Per cardiology preoperative evaluation 09/04/2021, "Chart reviewed as part of pre-operative protocol coverage.Pt has a history of ischemic cardiomyopathy with last PCI in 2018. Given her CAD, we would prefer to continue ASA throughout the perioperative period. If doing so would significantly increase morbidity or mortality, may hold for 5 days.")        Anesthesia Quick Evaluation

## 2022-05-14 NOTE — Anesthesia Postprocedure Evaluation (Signed)
Anesthesia Post Note  Patient: Carrie Barnes  Procedure(s) Performed: OPEN REDUCTION INTERNAL FIXATION RIGHT ACETABULUM (Right: Hip)     Patient location during evaluation: PACU Anesthesia Type: General Level of consciousness: awake Pain management: pain level controlled Vital Signs Assessment: post-procedure vital signs reviewed and stable Respiratory status: spontaneous breathing Cardiovascular status: stable Postop Assessment: no apparent nausea or vomiting Anesthetic complications: no   No notable events documented.  Last Vitals:  Vitals:   05/14/22 1645 05/14/22 1709  BP: (!) 113/59 (!) 104/52  Pulse: 98 89  Resp: 14 16  Temp: (!) 36.3 C 37.1 C  SpO2: 97% 94%    Last Pain:  Vitals:   05/14/22 1709  TempSrc: Oral  PainSc:                  Huston Foley

## 2022-05-14 NOTE — Transfer of Care (Signed)
Immediate Anesthesia Transfer of Care Note  Patient: Carrie Barnes  Procedure(s) Performed: OPEN REDUCTION INTERNAL FIXATION RIGHT ACETABULUM (Right: Hip)  Patient Location: PACU  Anesthesia Type:General  Level of Consciousness: awake, oriented and patient cooperative  Airway & Oxygen Therapy: Patient Spontanous Breathing and Patient connected to nasal cannula oxygen  Post-op Assessment: Report given to RN and Post -op Vital signs reviewed and stable  Post vital signs: Reviewed  Last Vitals:  Vitals Value Taken Time  BP 141/56 05/14/22 1615  Temp    Pulse 104 05/14/22 1622  Resp 11 05/14/22 1622  SpO2 96 % 05/14/22 1622  Vitals shown include unvalidated device data.  Last Pain:  Vitals:   05/14/22 1243  TempSrc:   PainSc: 8       Patients Stated Pain Goal: 0 (26/71/24 5809)  Complications: No notable events documented.

## 2022-05-14 NOTE — Anesthesia Procedure Notes (Addendum)
Procedure Name: Intubation Date/Time: 05/14/2022 1:57 PM  Performed by: Santa Lighter, MDPre-anesthesia Checklist: Patient identified, Emergency Drugs available, Suction available and Patient being monitored Patient Re-evaluated:Patient Re-evaluated prior to induction Oxygen Delivery Method: Circle System Utilized Preoxygenation: Pre-oxygenation with 100% oxygen Induction Type: IV induction Ventilation: Mask ventilation without difficulty Laryngoscope Size: Mac and 3 Grade View: Grade I Tube type: Oral Tube size: 7.0 mm Number of attempts: 1 Airway Equipment and Method: Stylet and Oral airway Placement Confirmation: ETT inserted through vocal cords under direct vision, positive ETCO2 and breath sounds checked- equal and bilateral Secured at: 23 cm Tube secured with: Tape Dental Injury: Teeth and Oropharynx as per pre-operative assessment

## 2022-05-14 NOTE — Progress Notes (Signed)
SLP Cancellation Note  Patient Details Name: Carrie Barnes MRN: [id number] DOB: 05-Nov-1935   Cancelled treatment:       Reason Eval/Treat Not Completed: Per chart and RN, pt NPO at this time for planned surgery. Will f/u when medically appropriate for completion of swallow eval.     Ellwood Dense, [place], [place] Office Number: [phone]  Acie Fredrickson 05/14/2022, 11:21 AM

## 2022-05-14 NOTE — Op Note (Signed)
Orthopaedic Surgery Operative Note (CSN: [id number] ) Date of Surgery: 05/14/2022  Admit Date: 05/12/2022   Diagnoses: Pre-Op Diagnoses: Right both column acetabular fracture Right superior pubic ramus fracture  Post-Op Diagnosis: Same  Procedures: CPT 27228-Open reduction internal fixation of right acetabular fracture CPT 27217-Open reduction internal fixation of right superior pubic ramus fracture  Surgeons : Primary: Shona Needles, MD  Assistant: Patrecia Pace, PA-C  Location: OR 3   Anesthesia:General   Antibiotics: Ancef 2g preop with 1 gm vancomycin powder placed topically in wound   Tourniquet time:None    Estimated Blood GEXB:284 mL  Complications:None  Specimens:None   Implants: Implant Name Type Inv. Item Serial No. Manufacturer Lot No. LRB No. Used Action  PLATE SUPRAPECTINEAL PELVIS - [id number] Plate PLATE SUPRAPECTINEAL PELVIS  [employer] [id number] Right 1 Implanted  SCREW CORTEX ST MATTA 3.5X45MM - [id number] Screw SCREW CORTEX ST MATTA 3.5X45MM  [employer]  Right 3 Implanted  SCREW CORTEX ST MATTA 3.5X40MM - [id number] Screw SCREW CORTEX ST MATTA 3.5X40MM  [employer]  Right 1 Implanted  SCREW CORTEX ST MATTA 3.5X36MM - [id number] Screw SCREW CORTEX ST MATTA 3.5X36MM  [employer]  Right 1 Implanted  SCREW CORTEX ST MATTA 3.5X38M - [id number] Screw SCREW CORTEX ST MATTA 3.5X38M  [employer]  Right 1 Implanted  SCREW CORTEX ST MATTA 3.5X34MM - [id number] Screw SCREW CORTEX ST MATTA 3.5X34MM  [employer]  Right 1 Implanted  SCREW CORTEX ST MATTA 3.5X26MM - [id number] Screw SCREW CORTEX ST MATTA 3.5X26MM  [employer]  Right 1 Implanted  SCREW CORTEX ST MATTA 3.5X50MM - [id number] Screw SCREW CORTEX ST MATTA 3.5X50MM  [employer]  Right 1 Implanted     Indications for Surgery: 86 year old female who sustained a ground-level fall and a right both column acetabular fracture.  Due to the unstable  nature of her injury I felt that she was indicated for open reduction internal fixation.  Risks and benefits were discussed with the patient.  Risks included but not limited to bleeding, infection, malunion, nonunion, hardware failure, hardware irritation, nerve and blood vessel injury, posttraumatic arthritis, DVT, even the possibility anesthetic complications.  She agreed to proceed with surgery and consent was obtained.  Operative Findings: 1.  Open reduction internal fixation right both column acetabular fracture using Stryker suprapectoral nail plate 2.  Open reduction internal fixation of right superior pubic ramus fracture at the junction of the symphysis and pubic ramus fracture treated with fixation using the above suprapectoral nail plate  Procedure: The patient was identified in the preoperative holding area. Consent was confirmed with the patient and their family and all questions were answered. The operative extremity was marked after confirmation with the patient. she was then brought back to the operating room by our anesthesia colleagues.  She was placed under general anesthetic and carefully transferred over to radiolucent flat top table.  A bump was placed under her knee to flex her hip to be able to relax the iliopsoas to access the acetabulum.  The pelvis was then prepped and draped in usual sterile fashion.  A timeout was performed to verify the patient, the procedure, and the extremity.  Preoperative antibiotics were dosed.  Fluoroscopic imaging showed the unstable nature of her injury.  A Pfannenstiel approach was made through her previous C-section scar.  I carried it down through skin and subcutaneous tissue.  I exposed the overlying rectus fascia.  At the midline of her fascia I incised vertically to enter the retropubic space.  I  stayed extraperitoneal and continued my dissection down to the pubic symphysis.  I released the anterior portion of the rectus abdominis and mobilized  this to access the superior pubic ramus.  I carefully dissected along the brim of the pelvis until I reached the corona mortise.  I used vessel clips to clip the venous structures and was able to incise through this.  I continued this through the iliopectineal fascia to enter the false pelvis.  I disrupted some of the clot that was present in the posterior column fracture that started to bleed from the fracture site.  There is no damage to any of the vessels.  I was able to pack this and continue through the case.  I then proceeded to use a ball spike pusher to reduce the posterior column laterally.  Using fluoroscopic imaging this showed that the joint was relatively well reduced with this maneuver and the ilioischial and iliopectineal lines were decently aligned.  I felt that with plate fixation using the supra pectineal plate that the remainder of the anterior column and posterior column would be reduced.  The plate was then placed into the incision.  Placed it lateral to the obturator nerve which I was able to visualize and protect throughout the case.  I then used a ball spike pusher to lateralize the plate to provide a buttress to the posterior column and anterior column.  I then provided fixation to the superior pubic ramus and then pivoted my plate to buttress and fix the anterior and posterior column and provide that lateralization force.  I then placed a nonlocking screw proximal to the acetabulum to hold this lateralization force.  I obtained a fluoroscopic imaging which showed adequate reduction of the articular surface.  I then proceeded to place the remainder of the screws along the sciatic buttress and across into the posterior column and intact ilium.  I then placed a fixation in the superior pubic ramus anteriorly bridging across the small nondisplaced fracture of the superior pubic ramus.  Final fluoroscopic imaging was obtained.  The incisions were copiously irrigated.  A gram of vancomycin  powder was placed into the incision.  A layered closure of 0 Vicryl for the fascia and then 2-0 Vicryl and 3-0 Monocryl for the skin.  Dermabond was used to seal the skin.  Sterile dressings were applied.  The patient was then awoken from anesthesia and taken to the PACU in stable condition.  Post Op Plan/Instructions: The patient will need touchdown weightbearing to the right lower extremity.  She will undergo reverse total shoulder arthroplasty with Dr. Aundria Rud tomorrow.  We will have her mobilize with physical and Occupational Therapy.  Will recommend Lovenox while inpatient and discharged on oral DOAC.  We will have her mobilize with physical and Occupational Therapy.  I was present and performed the entire surgery.  Ulyses Southward, PA-C did assist me throughout the case. An assistant was necessary given the difficulty in approach, maintenance of reduction and ability to instrument the fracture.   Truitt Merle, MD [facility]

## 2022-05-14 NOTE — OR Nursing (Signed)
Large arm/shoulder sling applied to patients right arm after intubation and before start of procedure. Applied by Rushie Nyhan, PA.

## 2022-05-14 NOTE — Progress Notes (Signed)
Carrie Barnes  IWL:[id number] DOB: 12-Apr-1936 DOA: 05/12/2022 PCP: Seward Carol, MD    Brief Narrative:  86 year old with a history of CAD status post stenting in 2018, HLD, and HTN who suffered a mechanical fall in a parking lot after which she complained of severe pain in her hip.  In the ER x-rays revealed a comminuted fracture of the right humeral neck as well as an acute comminuted fracture of the right acetabulum with extension into the iliac crest.  Plan for the OR on 10/4  Consultants:  Orthopedics  Cardiology   Goals of Care:  Code Status: Full Code   DVT prophylaxis: SCDs until post-op  Interim Hx: C/o shoulder pain  Assessment & Plan:  Acute fractures of the right acetabulum and right humerus status post mechanical fall Care per orthopedic surgery -plan for OR on 10/4  Coronary artery disease status post DES 2018 Clinically stable - no hx suggestive of USAP or CHF - was seen by Cards at request of Ortho, and Cards confirmed there is no need for further pre-operative evaluation   Hypomagnesemia Corrected with supplementation  Suboptimal B12 B12 320 - supplement with goal of 400+  HTN Blood pressure well controlled at present  HLD Continue usual dose of Lipitor  Chronic normocytic anemia Follow Hgb trend -iron studies suggestive of chronic disease with low iron state   Family Communication: no family present at time of exam  Disposition:  will depend upon performance w/ PT/OT postoperatively    Objective: Blood pressure (!) 117/53, pulse 99, temperature 99.1 F (37.3 C), temperature source Oral, resp. rate 17, height 5\' 2"  (1.575 m), weight 70 kg, SpO2 97 %.  Intake/Output Summary (Last 24 hours) at 05/14/2022 0936 Last data filed at 05/14/2022 1740 Gross per 24 hour  Intake 240 ml  Output 1225 ml  Net -985 ml   Filed Weights   05/12/22 1627  Weight: 70 kg    Examination:  General: Appearance:     Overweight female who appears  uncomfortable     Lungs:     Clear to auscultation bilaterally, respirations unlabored  Heart:    Normal heart rate.   MS:   All extremities are intact.   Neurologic:   Awake, alert, oriented x 3. No apparent focal neurological           defect.      CBC: Recent Labs  Lab 05/12/22 2055 05/14/22 0301  WBC 16.2* 9.0  NEUTROABS 13.7*  --   HGB 10.4* 9.2*  HCT 30.1* 26.5*  MCV 88.5 89.8  PLT 159 814*   Basic Metabolic Panel: Recent Labs  Lab 05/12/22 2055 05/13/22 0145 05/13/22 0814 05/14/22 0301  NA 139  --   --  133*  K 3.6  --   --  4.0  CL 103  --   --  101  CO2 27  --   --  24  GLUCOSE 145*  --   --  131*  BUN 12  --   --  9  CREATININE 0.88  --   --  0.75  CALCIUM 9.1  --   --  8.4*  MG 1.6* 1.9 1.7 1.8  PHOS 3.7  --   --   --    GFR: Estimated Creatinine Clearance: 48 mL/min (by C-G formula based on SCr of 0.75 mg/dL).   Scheduled Meds:  atorvastatin  80 mg Oral Daily   Chlorhexidine Gluconate Cloth  6 each Topical Daily   docusate  sodium  100 mg Oral BID   feeding supplement  237 mL Oral BID BM   latanoprost  1 drop Both Eyes QHS   metoprolol succinate  25 mg Oral Daily   multivitamin with minerals  1 tablet Oral Daily   pantoprazole  40 mg Oral Daily   povidone-iodine  2 Application Topical Once   senna  1 tablet Oral BID   timolol  1 drop Left Eye BID   Urelle  1 tablet Oral QID   Continuous Infusions:  sodium chloride 75 mL/hr at 05/13/22 2130    ceFAZolin (ANCEF) IV     methocarbamol (ROBAXIN) IV     tranexamic acid       LOS: 2 days   Marlin Canary DO  If 7PM-7AM, please contact night-coverage per Amion 05/14/2022, 9:36 AM

## 2022-05-14 NOTE — TOC CAGE-AID Note (Signed)
Transition of Care [facility]) - CAGE-AID Screening   Patient Details  Name: Carrie Barnes MRN: [id number] Date of Birth: November 15, 1935  [facility]) CM/SW Contact:    Coralee Pesa, [place] Phone Number: 05/14/2022, 11:37 AM   Clinical Narrative: CSW met with pt at bedside to complete CAGE- AID assessment. Pt answered no to all questions and denies any history of SU. Resources not needed at this time.   CAGE-AID Screening:    Have You Ever Felt You Ought to Cut Down on Your Drinking or Drug Use?: No Have People Annoyed You By Critizing Your Drinking Or Drug Use?: No Have You Felt Bad Or Guilty About Your Drinking Or Drug Use?: No Have You Ever Had a Drink or Used Drugs First Thing In The Morning to Steady Your Nerves or to Get Rid of a Hangover?: No CAGE-AID Score: 0  Substance Abuse Education Offered: No

## 2022-05-15 ENCOUNTER — Telehealth (HOSPITAL_COMMUNITY): Payer: Self-pay | Admitting: Pharmacy Technician

## 2022-05-15 ENCOUNTER — Inpatient Hospital Stay (HOSPITAL_COMMUNITY): Payer: Medicare Other

## 2022-05-15 ENCOUNTER — Inpatient Hospital Stay (HOSPITAL_COMMUNITY): Payer: Medicare Other | Admitting: Certified Registered"

## 2022-05-15 ENCOUNTER — Other Ambulatory Visit: Payer: Self-pay

## 2022-05-15 ENCOUNTER — Encounter (HOSPITAL_COMMUNITY)
Admission: EM | Disposition: A | Discharge status: Skilled Nursing Facility | Payer: Self-pay | Source: Home / Self Care | Attending: Internal Medicine

## 2022-05-15 ENCOUNTER — Encounter (HOSPITAL_COMMUNITY): Payer: Self-pay | Admitting: Internal Medicine

## 2022-05-15 ENCOUNTER — Other Ambulatory Visit (HOSPITAL_COMMUNITY): Payer: Self-pay

## 2022-05-15 DIAGNOSIS — I2510 Atherosclerotic heart disease of native coronary artery without angina pectoris: Secondary | ICD-10-CM | POA: Diagnosis not present

## 2022-05-15 DIAGNOSIS — I10 Essential (primary) hypertension: Secondary | ICD-10-CM

## 2022-05-15 DIAGNOSIS — S42201A Unspecified fracture of upper end of right humerus, initial encounter for closed fracture: Secondary | ICD-10-CM

## 2022-05-15 DIAGNOSIS — Z87891 Personal history of nicotine dependence: Secondary | ICD-10-CM

## 2022-05-15 DIAGNOSIS — S32401A Unspecified fracture of right acetabulum, initial encounter for closed fracture: Secondary | ICD-10-CM | POA: Diagnosis not present

## 2022-05-15 HISTORY — PX: REVERSE SHOULDER ARTHROPLASTY: SHX5054

## 2022-05-15 LAB — POCT I-STAT EG7
Acid-Base Excess: 6 mmol/L — ABNORMAL HIGH (ref 0.0–2.0)
Bicarbonate: 30.1 mmol/L — ABNORMAL HIGH (ref 20.0–28.0)
Calcium, Ion: 1.15 mmol/L (ref 1.15–1.40)
HCT: 19 % — ABNORMAL LOW (ref 36.0–46.0)
Hemoglobin: 6.5 g/dL — CL (ref 12.0–15.0)
O2 Saturation: 59 %
Patient temperature: 37.5
Potassium: 4.9 mmol/L (ref 3.5–5.1)
Sodium: 135 mmol/L (ref 135–145)
TCO2: 31 mmol/L (ref 22–32)
pCO2, Ven: 42.7 mmHg — ABNORMAL LOW (ref 44–60)
pH, Ven: 7.458 — ABNORMAL HIGH (ref 7.25–7.43)
pO2, Ven: 30 mmHg — CL (ref 32–45)

## 2022-05-15 LAB — BASIC METABOLIC PANEL
Anion gap: 6 (ref 5–15)
BUN: 10 mg/dL (ref 8–23)
CO2: 26 mmol/L (ref 22–32)
Calcium: 8 mg/dL — ABNORMAL LOW (ref 8.9–10.3)
Chloride: 102 mmol/L (ref 98–111)
Creatinine, Ser: 0.8 mg/dL (ref 0.44–1.00)
GFR, Estimated: >60 mL/min (ref >60)
Glucose, Bld: 152 mg/dL — ABNORMAL HIGH (ref 70–99)
Potassium: 4.4 mmol/L (ref 3.5–5.1)
Sodium: 134 mmol/L — ABNORMAL LOW (ref 135–145)

## 2022-05-15 LAB — BPAM RBC
Blood Product Expiration Date: 202310262359
Blood Product Expiration Date: 202310262359
ISSUE DATE / TIME: 202310041500
ISSUE DATE / TIME: 202310041500
Unit Type and Rh: 6200
Unit Type and Rh: 6200

## 2022-05-15 LAB — TYPE AND SCREEN
ABO/RH(D): A POS
Antibody Screen: NEGATIVE
Unit division: 0
Unit division: 0

## 2022-05-15 LAB — CBC
HCT: 29.4 % — ABNORMAL LOW (ref 36.0–46.0)
Hemoglobin: 10.4 g/dL — ABNORMAL LOW (ref 12.0–15.0)
MCH: 31.5 pg (ref 26.0–34.0)
MCHC: 35.4 g/dL (ref 30.0–36.0)
MCV: 89.1 fL (ref 80.0–100.0)
Platelets: 108 10*3/uL — ABNORMAL LOW (ref 150–400)
RBC: 3.3 MIL/uL — ABNORMAL LOW (ref 3.87–5.11)
RDW: 13.2 % (ref 11.5–15.5)
WBC: 11 10*3/uL — ABNORMAL HIGH (ref 4.0–10.5)
nRBC: 0 % (ref 0.0–0.2)

## 2022-05-15 SURGERY — ARTHROPLASTY, SHOULDER, TOTAL, REVERSE
Anesthesia: General | Site: Shoulder | Laterality: Right

## 2022-05-15 MED ORDER — VANCOMYCIN HCL 1000 MG IV SOLR
INTRAVENOUS | Status: AC
  Filled 2022-05-15: qty 20

## 2022-05-15 MED ORDER — PHENYLEPHRINE 80 MCG/ML (10ML) SYRINGE FOR IV PUSH (FOR BLOOD PRESSURE SUPPORT)
PREFILLED_SYRINGE | INTRAVENOUS | Status: DC | PRN
  Administered 2022-05-15: 160 ug via INTRAVENOUS

## 2022-05-15 MED ORDER — MENTHOL 3 MG MT LOZG: 1.0000 | LOZENGE | OROMUCOSAL | Status: DC | PRN

## 2022-05-15 MED ORDER — ONDANSETRON HCL 4 MG/2ML IJ SOLN: 4.0000 mg | Freq: Four times a day (QID) | INTRAMUSCULAR | Status: DC | PRN

## 2022-05-15 MED ORDER — VANCOMYCIN HCL 1 G IV SOLR
INTRAVENOUS | Status: DC | PRN
  Administered 2022-05-15: 1000 mg

## 2022-05-15 MED ORDER — ORAL CARE MOUTH RINSE: 15.0000 mL | Freq: Once | OROMUCOSAL | Status: AC

## 2022-05-15 MED ORDER — DOCUSATE SODIUM 100 MG PO CAPS: 100.0000 mg | ORAL_CAPSULE | Freq: Two times a day (BID) | ORAL | Status: DC

## 2022-05-15 MED ORDER — FENTANYL CITRATE (PF) 100 MCG/2ML IJ SOLN
INTRAMUSCULAR | Status: DC | PRN
  Administered 2022-05-15: 50 ug via INTRAVENOUS

## 2022-05-15 MED ORDER — BUPIVACAINE LIPOSOME 1.3 % IJ SUSP
INTRAMUSCULAR | Status: DC | PRN
  Administered 2022-05-15: 10 mL via PERINEURAL

## 2022-05-15 MED ORDER — MIDAZOLAM HCL 2 MG/2ML IJ SOLN
INTRAMUSCULAR | Status: AC
  Filled 2022-05-15: qty 2

## 2022-05-15 MED ORDER — DEXAMETHASONE SODIUM PHOSPHATE 10 MG/ML IJ SOLN
INTRAMUSCULAR | Status: DC | PRN
  Administered 2022-05-15: 5 mg via INTRAVENOUS

## 2022-05-15 MED ORDER — ACETAMINOPHEN 10 MG/ML IV SOLN
INTRAVENOUS | Status: DC | PRN
  Administered 2022-05-15: 1000 mg via INTRAVENOUS

## 2022-05-15 MED ORDER — CEFAZOLIN SODIUM-DEXTROSE 2-4 GM/100ML-% IV SOLN
2.0000 g | Freq: Once | INTRAVENOUS | Status: AC
  Administered 2022-05-15: 2 g via INTRAVENOUS
  Filled 2022-05-15: qty 100

## 2022-05-15 MED ORDER — ROCURONIUM BROMIDE 10 MG/ML (PF) SYRINGE
PREFILLED_SYRINGE | INTRAVENOUS | Status: DC | PRN
  Administered 2022-05-15: 60 mg via INTRAVENOUS

## 2022-05-15 MED ORDER — TRANEXAMIC ACID-NACL 1000-0.7 MG/100ML-% IV SOLN
1000.0000 mg | Freq: Once | INTRAVENOUS | Status: AC
  Administered 2022-05-15: 1000 mg via INTRAVENOUS
  Filled 2022-05-15 (×2): qty 100

## 2022-05-15 MED ORDER — PHENYLEPHRINE 80 MCG/ML (10ML) SYRINGE FOR IV PUSH (FOR BLOOD PRESSURE SUPPORT)
PREFILLED_SYRINGE | INTRAVENOUS | Status: AC
  Filled 2022-05-15: qty 10

## 2022-05-15 MED ORDER — CEFAZOLIN SODIUM-DEXTROSE 1-4 GM/50ML-% IV SOLN
1.0000 g | Freq: Four times a day (QID) | INTRAVENOUS | Status: AC
  Administered 2022-05-15 – 2022-05-16 (×3): 1 g via INTRAVENOUS
  Filled 2022-05-15 (×3): qty 50

## 2022-05-15 MED ORDER — PROPOFOL 10 MG/ML IV BOLUS
INTRAVENOUS | Status: DC | PRN
  Administered 2022-05-15: 100 mg via INTRAVENOUS

## 2022-05-15 MED ORDER — ONDANSETRON HCL 4 MG PO TABS: 4.0000 mg | ORAL_TABLET | Freq: Four times a day (QID) | ORAL | Status: DC | PRN

## 2022-05-15 MED ORDER — PHENOL 1.4 % MT LIQD: 1.0000 | OROMUCOSAL | Status: DC | PRN

## 2022-05-15 MED ORDER — ACETAMINOPHEN 10 MG/ML IV SOLN
INTRAVENOUS | Status: AC
  Filled 2022-05-15: qty 100

## 2022-05-15 MED ORDER — BUPIVACAINE-EPINEPHRINE (PF) 0.5% -1:200000 IJ SOLN
INTRAMUSCULAR | Status: DC | PRN
  Administered 2022-05-15: 15 mL via PERINEURAL

## 2022-05-15 MED ORDER — FENTANYL CITRATE (PF) 100 MCG/2ML IJ SOLN: 100.0000 ug | Freq: Once | INTRAMUSCULAR | Status: AC

## 2022-05-15 MED ORDER — LIDOCAINE 2% (20 MG/ML) 5 ML SYRINGE
INTRAMUSCULAR | Status: DC | PRN
  Administered 2022-05-15: 60 mg via INTRAVENOUS

## 2022-05-15 MED ORDER — SUGAMMADEX SODIUM 200 MG/2ML IV SOLN
INTRAVENOUS | Status: DC | PRN
  Administered 2022-05-15: 200 mg via INTRAVENOUS

## 2022-05-15 MED ORDER — FENTANYL CITRATE (PF) 100 MCG/2ML IJ SOLN: 25.0000 ug | INTRAMUSCULAR | Status: DC | PRN

## 2022-05-15 MED ORDER — FENTANYL CITRATE (PF) 250 MCG/5ML IJ SOLN
INTRAMUSCULAR | Status: AC
  Filled 2022-05-15: qty 5

## 2022-05-15 MED ORDER — METOCLOPRAMIDE HCL 5 MG/ML IJ SOLN: 5.0000 mg | Freq: Three times a day (TID) | INTRAMUSCULAR | Status: DC | PRN

## 2022-05-15 MED ORDER — ONDANSETRON HCL 4 MG/2ML IJ SOLN
INTRAMUSCULAR | Status: AC
  Filled 2022-05-15: qty 2

## 2022-05-15 MED ORDER — LACTATED RINGERS IV SOLN: INTRAVENOUS | Status: DC

## 2022-05-15 MED ORDER — FENTANYL CITRATE (PF) 100 MCG/2ML IJ SOLN
INTRAMUSCULAR | Status: AC
  Administered 2022-05-15: 100 ug via INTRAVENOUS
  Filled 2022-05-15: qty 2

## 2022-05-15 MED ORDER — CHLORHEXIDINE GLUCONATE 0.12 % MT SOLN
OROMUCOSAL | Status: AC
  Administered 2022-05-15: 15 mL via OROMUCOSAL
  Filled 2022-05-15: qty 15

## 2022-05-15 MED ORDER — METOCLOPRAMIDE HCL 5 MG PO TABS: 5.0000 mg | ORAL_TABLET | Freq: Three times a day (TID) | ORAL | Status: DC | PRN

## 2022-05-15 MED ORDER — PHENYLEPHRINE HCL-NACL 20-0.9 MG/250ML-% IV SOLN
INTRAVENOUS | Status: DC | PRN
  Administered 2022-05-15: 25 ug/min via INTRAVENOUS

## 2022-05-15 MED ORDER — ALBUMIN HUMAN 5 % IV SOLN: INTRAVENOUS | Status: DC | PRN

## 2022-05-15 MED ORDER — ONDANSETRON HCL 4 MG/2ML IJ SOLN
INTRAMUSCULAR | Status: DC | PRN
  Administered 2022-05-15: 4 mg via INTRAVENOUS

## 2022-05-15 MED ORDER — ACETAMINOPHEN 500 MG PO TABS
1000.0000 mg | ORAL_TABLET | Freq: Four times a day (QID) | ORAL | Status: DC
  Administered 2022-05-16 – 2022-05-21 (×20): 1000 mg via ORAL
  Filled 2022-05-15 (×23): qty 2

## 2022-05-15 MED ORDER — 0.9 % SODIUM CHLORIDE (POUR BTL) OPTIME
TOPICAL | Status: DC | PRN
  Administered 2022-05-15 (×2): 1000 mL

## 2022-05-15 MED ORDER — CHLORHEXIDINE GLUCONATE 0.12 % MT SOLN: 15.0000 mL | Freq: Once | OROMUCOSAL | Status: AC

## 2022-05-15 SURGICAL SUPPLY — 75 items
AID PSTN UNV HD RSTRNT DISP (MISCELLANEOUS) ×1
BAG COUNTER SPONGE SURGICOUNT (BAG) ×1 IMPLANT
BAG SPNG CNTER NS LX DISP (BAG) ×1
BASEPLATE MOD 24 (Plate) IMPLANT
BIT DRILL 5/64X5 DISP (BIT) ×1 IMPLANT
BIT DRILL FLUTED 3.0 STRL (BIT) IMPLANT
BLADE SAG 18X100X1.27 (BLADE) ×1 IMPLANT
BSPLAT GLND 24 MDLR STRL LF (Plate) ×1 IMPLANT
CLSR STERI-STRIP ANTIMIC 1/2X4 (GAUZE/BANDAGES/DRESSINGS) IMPLANT
COVER SURGICAL LIGHT HANDLE (MISCELLANEOUS) ×1 IMPLANT
CUP SUT UNIV REV NEUTRAL 33 (Cup) IMPLANT
CUP SUT UNIV REVERS 36 NEUTRAL (Cup) IMPLANT
DRAPE IMP U-DRAPE 54X76 (DRAPES) ×2 IMPLANT
DRAPE INCISE IOBAN 66X45 STRL (DRAPES) ×1 IMPLANT
DRAPE ORTHO SPLIT 77X108 STRL (DRAPES) ×2
DRAPE SURG 17X23 STRL (DRAPES) ×1 IMPLANT
DRAPE SURG ORHT 6 SPLT 77X108 (DRAPES) ×2 IMPLANT
DRAPE U-SHAPE 47X51 STRL (DRAPES) ×1 IMPLANT
DRESSING AQUACEL AG SP 3.5X6 (GAUZE/BANDAGES/DRESSINGS) ×1 IMPLANT
DRSG AQUACEL AG ADV 3.5X10 (GAUZE/BANDAGES/DRESSINGS) ×1 IMPLANT
DRSG AQUACEL AG SP 3.5X6 (GAUZE/BANDAGES/DRESSINGS)
DURAPREP 26ML APPLICATOR (WOUND CARE) ×1 IMPLANT
ELECT BLADE 4.0 EZ CLEAN MEGAD (MISCELLANEOUS) ×1
ELECT REM PT RETURN 9FT ADLT (ELECTROSURGICAL) ×1
ELECTRODE BLDE 4.0 EZ CLN MEGD (MISCELLANEOUS) ×1 IMPLANT
ELECTRODE REM PT RTRN 9FT ADLT (ELECTROSURGICAL) ×1 IMPLANT
FACESHIELD WRAPAROUND (MASK) ×1 IMPLANT
FACESHIELD WRAPAROUND OR TEAM (MASK) ×1 IMPLANT
GLENOSPHERE 36 +4 LAT/24 (Joint) IMPLANT
GLOVE BIO SURGEON STRL SZ7.5 (GLOVE) ×2 IMPLANT
GLOVE BIOGEL PI IND STRL 8 (GLOVE) ×2 IMPLANT
GOWN STRL REUS W/ TWL LRG LVL3 (GOWN DISPOSABLE) ×1 IMPLANT
GOWN STRL REUS W/ TWL XL LVL3 (GOWN DISPOSABLE) ×2 IMPLANT
GOWN STRL REUS W/TWL LRG LVL3 (GOWN DISPOSABLE) ×1
GOWN STRL REUS W/TWL XL LVL3 (GOWN DISPOSABLE) ×2
INSERT HUMERAL UNI REVERS 36 6 (Insert) IMPLANT
KIT BASIN OR (CUSTOM PROCEDURE TRAY) ×1 IMPLANT
KIT TURNOVER KIT B (KITS) ×1 IMPLANT
MANIFOLD NEPTUNE II (INSTRUMENTS) ×1 IMPLANT
NDL 1/2 CIR MAYO (NEEDLE) ×1 IMPLANT
NDL HYPO 25GX1X1/2 BEV (NEEDLE) ×1 IMPLANT
NEEDLE 1/2 CIR MAYO (NEEDLE) ×1 IMPLANT
NEEDLE HYPO 25GX1X1/2 BEV (NEEDLE) ×1 IMPLANT
NS IRRIG 1000ML POUR BTL (IV SOLUTION) ×1 IMPLANT
PACK SHOULDER (CUSTOM PROCEDURE TRAY) ×1 IMPLANT
PAD ARMBOARD 7.5X6 YLW CONV (MISCELLANEOUS) ×2 IMPLANT
PIN NITINOL TARGETER 2.8 (PIN) IMPLANT
RESTRAINT HEAD UNIVERSAL NS (MISCELLANEOUS) ×1 IMPLANT
SCREW CENTRAL MODULAR 25 (Screw) IMPLANT
SCREW PERI LOCK 5.5X16 (Screw) IMPLANT
SCREW PERI LOCK 5.5X32 (Screw) IMPLANT
SCREW PERIPHERAL 5.5X28 LOCK (Screw) IMPLANT
SLING ARM IMMOBILIZER LRG (SOFTGOODS) IMPLANT
SLING ARM IMMOBILIZER MED (SOFTGOODS) IMPLANT
SPONGE T-LAP 18X18 ~~LOC~~+RFID (SPONGE) IMPLANT
SPONGE T-LAP 4X18 ~~LOC~~+RFID (SPONGE) ×1 IMPLANT
STEM HUMERAL UNIVER REV SIZE 7 (Stem) IMPLANT
STRIP CLOSURE SKIN 1/2X4 (GAUZE/BANDAGES/DRESSINGS) ×1 IMPLANT
SUCTION FRAZIER HANDLE 10FR (MISCELLANEOUS) ×1
SUCTION TUBE FRAZIER 10FR DISP (MISCELLANEOUS) ×1 IMPLANT
SUT FIBERWIRE #2 38 T-5 BLUE (SUTURE) ×6
SUT MNCRL AB 3-0 PS2 18 (SUTURE) ×1 IMPLANT
SUT VIC AB 0 CT1 27 (SUTURE) ×1
SUT VIC AB 0 CT1 27XBRD ANBCTR (SUTURE) IMPLANT
SUT VIC AB 1 CT1 27 (SUTURE) ×1
SUT VIC AB 1 CT1 27XBRD ANBCTR (SUTURE) IMPLANT
SUT VIC AB 2-0 CT1 27 (SUTURE) ×1
SUT VIC AB 2-0 CT1 TAPERPNT 27 (SUTURE) ×1 IMPLANT
SUTURE FIBERWR #2 38 T-5 BLUE (SUTURE) ×1 IMPLANT
SYR CONTROL 10ML LL (SYRINGE) ×1 IMPLANT
TOWEL GREEN STERILE (TOWEL DISPOSABLE) ×1 IMPLANT
TOWEL GREEN STERILE FF (TOWEL DISPOSABLE) ×1 IMPLANT
TOWER CARTRIDGE SMART MIX (DISPOSABLE) IMPLANT
WATER STERILE IRR 1000ML POUR (IV SOLUTION) ×1 IMPLANT
YANKAUER SUCT BULB TIP NO VENT (SUCTIONS) ×1 IMPLANT

## 2022-05-15 NOTE — Brief Op Note (Signed)
05/12/2022 - 05/15/2022  7:46 PM  PATIENT:  [name]  86 y.o. female  PRE-OPERATIVE DIAGNOSIS:     POST-OPERATIVE DIAGNOSIS:  right closed displaced fracture acetabulum  PROCEDURE:  Procedure(s): RIGHT REVERSE SHOULDER ARTHROPLASTY (Right)  SURGEON:  Surgeon(s) and Role:    * Nicholes Stairs, MD - Primary  PHYSICIAN ASSISTANT: Jonelle Sidle, PA-C   ANESTHESIA:   regional and general  EBL:  300 mL   BLOOD ADMINISTERED:none  DRAINS: none   LOCAL MEDICATIONS USED:  NONE  SPECIMEN:  No Specimen  DISPOSITION OF SPECIMEN:  N/A  COUNTS:  YES  TOURNIQUET:  * No tourniquets in log *  DICTATION: .Note written in EPIC  PLAN OF CARE: Admit to inpatient   PATIENT DISPOSITION:  PACU - hemodynamically stable.   Delay start of Pharmacological VTE agent (>24hrs) due to surgical blood loss or risk of bleeding: not applicable

## 2022-05-15 NOTE — Progress Notes (Signed)
Orthopaedic Trauma Progress Note  SUBJECTIVE: Doing fairly well this morning.  Notes pain in her pelvis is well controlled.  Having some soreness in her shoulder but this is manageable.  No chest pain. No SOB. No nausea/vomiting. No other complaints.  She is unsure what time her shoulder surgery is scheduled for today.  OBJECTIVE:  Vitals:   05/15/22 0440 05/15/22 0807  BP: 123/62 123/60  Pulse: 77 75  Resp: 17 17  Temp: 98.4 F (36.9 C) 99.7 F (37.6 C)  SpO2: 96% 93%    General: Sitting up in bed, no acute distress.  Pleasant and cooperative Respiratory: No increased work of breathing.  Pelvis/right lower extremity: Dressing clean, dry, intact.  No significant tenderness with palpation over bilateral hips.  Tolerates gentle motion of the knee.  Ankle DF/PF intact.  Compartment soft compressible.  Neurovascularly intact Right upper extremity: Sling in place.  Mild bruising of the shoulder and to the upper arm.  No shoulder motion attempted due to known fracture.  Able to wiggle fingers.  Endorses sensation that extremity.  Neurovascularly intact.  IMAGING: Stable post op imaging right acetabulum  LABS:  Results for orders placed or performed during the hospital encounter of 05/12/22 (from the past 24 hour(s))  Prepare RBC (crossmatch)     Status: None   Collection Time: 05/14/22 12:05 PM  Result Value Ref Range   Order Confirmation      ORDER PROCESSED BY BLOOD BANK Performed at [facility], [address], [place], [place] [address]   CBC     Status: Abnormal   Collection Time: 05/15/22  2:47 AM  Result Value Ref Range   WBC 11.0 (H) 4.0 - 10.5 K/uL   RBC 3.30 (L) 3.87 - 5.11 MIL/uL   Hemoglobin 10.4 (L) 12.0 - 15.0 g/dL   HCT 29.4 (L) 36.0 - 46.0 %   MCV 89.1 80.0 - 100.0 fL   MCH 31.5 26.0 - 34.0 pg   MCHC 35.4 30.0 - 36.0 g/dL   RDW 13.2 11.5 - 15.5 %   Platelets 108 (L) 150 - 400 K/uL   nRBC 0.0 0.0 - 0.2 %  Basic metabolic panel     Status: Abnormal    Collection Time: 05/15/22  2:47 AM  Result Value Ref Range   Sodium 134 (L) 135 - 145 mmol/L   Potassium 4.4 3.5 - 5.1 mmol/L   Chloride 102 98 - 111 mmol/L   CO2 26 22 - 32 mmol/L   Glucose, Bld 152 (H) 70 - 99 mg/dL   BUN 10 8 - 23 mg/dL   Creatinine, Ser 0.80 0.44 - 1.00 mg/dL   Calcium 8.0 (L) 8.9 - 10.3 mg/dL   GFR, Estimated >60 >60 mL/min   Anion gap 6 5 - 15    ASSESSMENT: Carrie Barnes is a 86 y.o. female, 1 Day Post-Op s/p OPEN REDUCTION INTERNAL FIXATION RIGHT ACETABULUM  CV/Blood loss: Acute blood loss anemia, Hgb 10.4 this morning.  Received 2 units PRBCs intraoperatively 05/14/2022. Hemodynamically stable  PLAN: Weightbearing:  - RLE: TDWB  - RUE: NWB ROM: Okay for unrestricted hip and knee motion as tolerated Incisional and dressing care: Reinforce dressings as needed  Showering: Okay to begin getting pelvic incision wet 05/17/2022 Orthopedic device(s):  - RLE: None  - RUE: Sling Pain management:  1. Tylenol 1000 mg q 6 hours scheduled 2. Robaxin 500 mg q 6 hours PRN 3. Oxycodone 5-10 mg q 4 hours PRN 4. Morphine 1-2 mg  q 2 hours PRN VTE prophylaxis:  Hold Lovenox today due to OR , SCDs ID:  Ancef 2gm post op Foley/Lines: Foley in place. KVO IVFs Impediments to Fracture Healing: Vit D level 42, no supplementation indicated Dispo: OR with Dr. Aundria Rud today for R shoulder. Plan for CT scan R hip post op to evaluate bony fixation and hardware.  Plan to start Lovenox 05/16/22  D/C recommendations: - Oxycodone and Robaxin for pain control - Switch to Eliquis 2.5 mg BID x 30 days for DVT prophylaxis - No need for Vit D supplementation  Follow - up plan: 2 weeks   Contact information:  Truitt Merle MD, Thyra Breed PA-C. After hours and holidays please check [email or web address] for group call information for [facility]   Thompson Caul, PA-C [phone] (office) [email or web address]

## 2022-05-15 NOTE — Care Management Important Message (Signed)
Important Message  Patient Details  Name: Carrie Barnes MRN: [id number] Date of Birth: [birth year]   Medicare Important Message Given:  Yes     Hannah Beat 05/15/2022, 2:36 PM

## 2022-05-15 NOTE — Op Note (Signed)
Date: 05/15/2022   PRE-OPERATIVE DIAGNOSIS:  Right proximal humerus fracture   POST-OPERATIVE DIAGNOSIS:  Same   PROCEDURE:  1. REVERSE SHOULDER ARTHROPLASTY   SURGEON:  Nicholes Stairs, MD   ASSISTANT: Jonelle Sidle, PA-C  Assistant attestation:  PA Mcclung present for the entire procedure.  He participated in all portions.   ANESTHESIA:   General with a block   ESTIMATED BLOOD LOSS: See anesthesia record   PREOPERATIVE INDICATIONS: Carrie Barnes is a right-hand-dominant 86 year old female who sustained a four-part, displaced proximal humerus fracture following a fall.  Due to the comminution and displaced nature of the fracture and head splitting components we discussed operative management.  We discussed moving forward with reverse shoulder arthroplasty for his injury to allow earlier weight bearing on a walker and/or cane as needed and lower risk of AVN, non union or progression of shoulder arthritis following the fracture.  She did also sustain a concomitant both column acetabulum fracture and will need to be able to bear some weight through the upper extremity.  Thus we elected to proceed with reverse shoulder arthroplasty for the plan.  The risks benefits and alternatives were discussed with the patient preoperatively including but not limited to the risks of infection, bleeding, nerve injury, cardiopulmonary complications, the need for revision surgery, dislocation, brachial plexus palsy, incomplete relief of pain, among others, and the patient was willing to proceed. The patient did provided informed consent.   OPERATIVE IMPLANTS:  Arthrex universe reverse size 7 stem 24 mm neutral baseplate with a 25 mm central screw and 4 peripheral locking screws 36 mm +4 mm lateralized glenosphere Standard humeral tray with a +6 constrained liner   OPERATIVE FINDINGS:   Comminuted proximal humerus four-part fracture with intact subscapularis as well as superior rotator cuff and  inferior posterior rotator cuff.  However, some thinning of the supraspinatus and evidence of previous repair with suture anchors in the tuberosity.  Also, bone quality was quite poor.  There was some full-thickness cartilage loss on the inferior aspect of the glenoid as well.   OPERATIVE PROCEDURE: The patient was brought to the operating room and placed in the supine position. General anesthesia was administered. IV antibiotics were given. Time out was performed. The upper extremity was prepped and draped in usual sterile fashion. The patient was in a beachchair position. Deltopectoral approach was carried out. After dissection through skin and subcutaneous fat, the cephalic vein was identified with the deltopectoral interval.  This was mobilized and taken medial.   The fracture was identified and working through the fracture in the biceps groove the lesser and greater tuberosities were freed up and tagged with #2 Fiber wire sutures.  The humeral head was fragmented and removed from the wound.     Long head of biceps tendon was tenotomized.   I then performed circumferential releases of the humerus.  I then moved to sizing the humerus.  There was minimal calcar bone loss and this was used to reference the height of the stem, as was the upper border of the pectoralis major tendon.  The canal was reamed and found to fit best with a 7 mm fracture stem.   We next turned to the glenoid.  Deep retractors were placed, and I resected the labrum as well as the residual long head of biceps, and then placed a guidepin into the center position on the glenoid, with slight inferior declination. I then reamed over the guidepin, and this created a small metaphyseal cancellus blush inferiorly,  removing just the cartilage to the subchondral bone superiorly. The base plate was selected and impacted place, and then I secured it centrally with a nonlocking screw.  Of note the central screw had fair bite but certainly this  was very osteoporotic bone.  And I had excellent purchase both inferiorly and superiorly. I placed a short locking screws on anterior aspect,  And posterior aspect.   I then turned my attention to the glenosphere, and impacted this into place utilizing the [facility] taper   The glenoid sphere was completely seated, and had engagement of the [facility] taper.  We then placed a setscrew . I then turned my attention back to the humerus.    The 7 mm fracture stem was seated to the appropriate height to allow approximate 5.6 cm from the top of the Pectoralis major tendon to the top of the glenosphere.  The stem was placed in 30 degrees of retroversion.   Once the stem was impacted into place we trialed poly liners.  Using the standard humeral metaglen,  The shoulder had excellent motion, and was stable.  The final poly was impacted and again showed good motion and stability.  Next, I irrigated the wounds copiously.    The greater tuberosity was brought back to the humeral stem suture holes and secured with bone graft from the humeral head as augment.  The lesser tuberosity was likewise brought back into a repaired position against the humeral stem.  We utilized sutures through the humeral stem and tray suture cup holes to allow for excellent fixation based on the stem itself.  We were satisfied with the tuberosity repair.  The axillary nerve was palpated at the end of implanting, and found to be in continuity and not under undo tension.   I then irrigated the shoulder copiously once more, we placed 1 g of vancomycin powder into the wound.  Repaired the deltopectoral interval with Vicryl followed by subcutaneous monocryl and then subcuticular monocryl with Steri-Strips and sterile gauze for the skin. The patient was awakened and returned back in stable and satisfactory condition. There no complications and he tolerated the procedure well.  All counts were correct.  The patient awakened from general anesthesia with no  complications and transferred to PACU in stable condition.   Postoperative Plan: Carrie Barnes will remain in sling when not using her walker for at least 1 month.  Weightbearing only allowed when using her walker.  I would like for her to sleep in the sling as well.  Otherwise she can begin pendulums and scapular retractions immediately.  Full range of motion of the elbow and hand and wrist allowed as tolerated starting on day 1.  We will progress range of motion after we see her in the office in 2 weeks with x-rays.  She will return to the floor postop, and may resume chemical DVT prophylaxis starting on postoperative day #1.

## 2022-05-15 NOTE — Telephone Encounter (Signed)
Pharmacy Patient Advocate Encounter  Insurance verification completed.    The patient is insured through [employer] Part D   The patient is currently admitted and ran test claims for the following: Eliquis, Xarelto.  Copays and coinsurance results were relayed to Inpatient clinical team.  

## 2022-05-15 NOTE — Anesthesia Preprocedure Evaluation (Addendum)
Anesthesia Evaluation  Patient identified by MRN, date of birth, ID band Patient awake    Reviewed: Allergy & Precautions, H&P , NPO status , Patient's Chart, lab work & pertinent test results, reviewed documented beta blocker date and time   Airway Mallampati: II  TM Distance: >3 FB Neck ROM: Full    Dental no notable dental hx. (+) Edentulous Upper, Edentulous Lower, Dental Advisory Given   Pulmonary neg pulmonary ROS, former smoker,    Pulmonary exam normal breath sounds clear to auscultation       Cardiovascular hypertension, Pt. on medications and Pt. on home beta blockers + CAD, + Past MI and + Cardiac Stents   Rhythm:Regular Rate:Normal     Neuro/Psych negative neurological ROS  negative psych ROS   GI/Hepatic Neg liver ROS, GERD  Medicated,  Endo/Other  negative endocrine ROS  Renal/GU negative Renal ROS  negative genitourinary   Musculoskeletal  (+) Arthritis , Osteoarthritis,    Abdominal   Peds  Hematology negative hematology ROS (+)   Anesthesia Other Findings   Reproductive/Obstetrics negative OB ROS                            Anesthesia Physical Anesthesia Plan  ASA: 3  Anesthesia Plan: General   Post-op Pain Management: Regional block* and Ofirmev IV (intra-op)*   Induction: Intravenous  PONV Risk Score and Plan: 3 and Ondansetron, Dexamethasone and Treatment may vary due to age or medical condition  Airway Management Planned: Oral ETT  Additional Equipment:   Intra-op Plan:   Post-operative Plan: Extubation in OR  Informed Consent: I have reviewed the patients History and Physical, chart, labs and discussed the procedure including the risks, benefits and alternatives for the proposed anesthesia with the patient or authorized representative who has indicated his/her understanding and acceptance.     Dental advisory given  Plan Discussed with:  CRNA  Anesthesia Plan Comments:         Anesthesia Quick Evaluation

## 2022-05-15 NOTE — Anesthesia Procedure Notes (Signed)
Procedure Name: Intubation Date/Time: 05/15/2022 6:07 PM  Performed by: Barrington Ellison, CRNAPre-anesthesia Checklist: Patient identified, Emergency Drugs available, Suction available and Patient being monitored Patient Re-evaluated:Patient Re-evaluated prior to induction Oxygen Delivery Method: Circle System Utilized Preoxygenation: Pre-oxygenation with 100% oxygen Induction Type: IV induction Ventilation: Mask ventilation without difficulty Laryngoscope Size: Mac and 3 Grade View: Grade I Tube type: Oral Tube size: 7.0 mm Number of attempts: 1 Airway Equipment and Method: Stylet and Oral airway Placement Confirmation: ETT inserted through vocal cords under direct vision, positive ETCO2 and breath sounds checked- equal and bilateral Secured at: 21 cm Tube secured with: Tape Dental Injury: Teeth and Oropharynx as per pre-operative assessment

## 2022-05-15 NOTE — Consult Note (Signed)
ORTHOPAEDIC CONSULTATION  REQUESTING PHYSICIAN: Geradine Girt, DO  PCP:  Seward Carol, MD  Chief Complaint: Right proximal humerus fracture  HPI: Carrie Barnes is a 86 y.o. female who complains of right shoulder pain following a ground-level fall a few days ago back on October 2.  She was evaluated in the ER by my colleague Hilbert Odor, PA-C.  She had a combination of injuries including a both column acetabular fracture as well as the proximal humerus fracture.  Our orthopedic trauma colleague Dr. Lennette Bihari Haddix is fixed the acetabulum and we are planning to take her to the operative theater today for reverse arthroplasty as definitive treatment for the right shoulder.  She is right-hand-dominant and lives alone.  She has help with her children, but plans to go to a rehab facility postoperatively.  She does have a recent history with Dr. Erasmo Score on the right shoulder.  He has 1 my partners here in town.  Currently denies smoking but is a former smoker.  Denies diabetes.  No numbness or tingling at this time.  Past Medical History:  Diagnosis Date   Cholecystitis    Coronary artery disease    a. 03/2017: 80-90% LAD stenosis (PCI/DES placement with a 2.75x16 mm Promus Premier stent). No significant stenosis along RCA or LCx.    DJD (degenerative joint disease)    GERD (gastroesophageal reflux disease)    Hyperlipidemia    Hypertension    dx s/p MI    Ischemic cardiomyopathy    Myocardial infarction ([place]) 03/17/2017   03-2017 treated at [facility] [place] [facility]    Osteoporosis    Status post insertion of drug-eluting stent into left anterior descending (LAD) artery 03/2017   Past Surgical History:  Procedure Laterality Date   APPENDECTOMY     CESAREAN SECTION     CHOLECYSTECTOMY N/A 11/05/2017   Procedure: LAPAROSCOPIC CHOLECYSTECTOMY WITH INTRAOPERATIVE CHOLANGIOGRAM;  Surgeon: Armandina Gemma, MD;  Location: [facility];  Service: General;  Laterality: N/A;   ERCP N/A  11/11/2017   Procedure: ENDOSCOPIC RETROGRADE CHOLANGIOPANCREATOGRAPHY (ERCP);  Surgeon: Arta Silence, MD;  Location: [facility];  Service: Endoscopy;  Laterality: N/A;  possible   EUS N/A 11/11/2017   Procedure: UPPER ENDOSCOPIC ULTRASOUND (EUS) RADIAL;  Surgeon: Arta Silence, MD;  Location: [facility];  Service: Endoscopy;  Laterality: N/A;   FEMUR IM NAIL Right 07/13/2019   Procedure: INTRAMEDULLARY (IM) RETROGRADE FEMORAL NAILING;  Surgeon: Gaynelle Arabian, MD;  Location: [facility];  Service: Orthopedics;  Laterality: Right;  66mn   LUMBAR LAMINECTOMY/ DECOMPRESSION WITH MET-RX     SHOULDER ARTHROSCOPY WITH ROTATOR CUFF REPAIR AND SUBACROMIAL DECOMPRESSION Right 09/20/2021   Procedure: SHOULDER MINI OPEN ROTATOR CUFF REPAIR AND SUBACROMIAL DECOMPRESSION WITH POSSIBLE PATCH GRAFT;  Surgeon: BSusa Day MD;  Location: [facility];  Service: Orthopedics;  Laterality: Right;  95MINS   stent  Left 03/2017   anterior descending ; drug eluting ; [facility] [place] [facility]    [place]    vaginal [place]     Social History   Socioeconomic History   Marital status: Widowed    Spouse name: Not on file   Number of children: 4   Years of education: Not on file   Highest education level: Not on file  Occupational History   Occupation: Homemaker   Occupation: [employer]  Tobacco Use   Smoking status: Former    Packs/day: 1.00  Years: 30.00    Total pack years: 30.00    Types: Cigarettes   Smokeless tobacco: Never   Tobacco comments:    quit 30 years   Vaping Use   Vaping Use: Never used  Substance and Sexual Activity   Alcohol use: Yes    Comment: 3 times a year   Drug use: Never   Sexual activity: Not on file  Other Topics Concern   Not on file  Social History Narrative   Not on file   Social Determinants of Health   Financial Resource Strain: Not on file  Food Insecurity: No Food Insecurity (05/12/2022)    Hunger Vital Sign    Worried About Running Out of Food in the Last Year: Never true    Ran Out of Food in the Last Year: Never true  Transportation Needs: No Transportation Needs (05/12/2022)   PRAPARE - Hydrologist (Medical): No    Lack of Transportation (Non-Medical): No  Physical Activity: Not on file  Stress: Not on file  Social Connections: Not on file   Family History  Problem Relation Age of Onset   Heart disease Father    Breast cancer Sister    Pancreatic cancer Sister    No Active Allergies Prior to Admission medications   Medication Sig Start Date End Date Taking? Authorizing Provider  acetaminophen (TYLENOL) 500 MG tablet Take 500-1,000 mg by mouth every 8 (eight) hours as needed for moderate pain or headache.   Yes [provider]  ascorbic acid (VITAMIN C) 500 MG tablet Take 500 mg by mouth daily.   Yes [provider]  aspirin EC 81 MG tablet Take 81 mg by mouth daily. Swallow whole.   Yes [provider]  atorvastatin (LIPITOR) 80 MG tablet Take 1 tablet (80 mg total) by mouth daily. 11/05/21  Yes Burnell Blanks, MD  Calcium Carb-Cholecalciferol (CALCIUM 600/VITAMIN D PO) Take 1 tablet by mouth in the morning and at bedtime.   Yes [provider]  cetirizine (ZYRTEC) 10 MG tablet Take 10 mg by mouth daily.   Yes [provider]  Cholecalciferol (VITAMIN D3) 2000 units capsule Take 2,000 Units by mouth daily.   Yes [provider]  CRANBERRY EXTRACT PO Take 25,000 mg by mouth daily.   Yes [provider]  docusate sodium (COLACE) 100 MG capsule Take 1 capsule (100 mg total) by mouth 2 (two) times daily as needed for mild constipation. 09/20/21  Yes Susa Day, MD  latanoprost (XALATAN) 0.005 % ophthalmic solution Place 1 drop into both eyes at bedtime. 08/12/21  Yes [provider]  methenamine (HIPREX) 1 g tablet Take 1 g by mouth 2 (two) times daily. 08/22/21  Yes  [provider]  metoprolol succinate (TOPROL-XL) 50 MG 24 hr tablet Take 1 tablet (50 mg total) by mouth daily. Needs follow up appointment for further refills. Patient taking differently: Take 50 mg by mouth every evening. Needs follow up appointment for further refills. 07/13/20  Yes Burnell Blanks, MD  Multiple Vitamin (MULTI-VITAMINS) TABS Take 1 tablet by mouth daily.   Yes [provider]  nitroGLYCERIN (NITROSTAT) 0.4 MG SL tablet Place 1 tablet (0.4 mg total) under the tongue every 5 (five) minutes as needed for chest pain. 12/17/20  Yes Burnell Blanks, MD  omeprazole (PRILOSEC) 20 MG capsule Take 20 mg by mouth daily.   Yes [provider]  Polyethyl Glycol-Propyl Glycol (SYSTANE OP) Place 1 drop  into both eyes daily as needed (dry eyes).   Yes [provider]  Probiotic Product (TRUNATURE DIGESTIVE PROBIOTIC) CAPS Take 1 capsule by mouth daily.   Yes [provider]  timolol (TIMOPTIC) 0.5 % ophthalmic solution Place 1 drop into the left eye 2 (two) times daily. 07/25/21  Yes [provider]  traZODone (DESYREL) 50 MG tablet Take 50 mg by mouth at bedtime as needed for sleep.   Yes [provider]  Trospium Chloride 60 MG CP24 Take 1 capsule by mouth daily. 04/08/22  Yes [provider]   DG Pelvis Comp Min 3V  Result Date: 05/14/2022 CLINICAL DATA:  Status post open reduction internal fixation of right acetabular fracture. EXAM: [facility]ET PELVIS - 3+ VIEW COMPARISON:  Fluoroscopic images of same day.  May 12, 2022. FINDINGS: Status post surgical internal fixation of right acetabular fracture. Mildly displaced and comminuted fracture of right inferior pubic ramus is noted. Remote surgical internal fixation of proximal right femoral fracture is noted. IMPRESSION: Status post surgical internal fixation of right acetabular fracture. Mildly displaced and comminuted fracture of right inferior pubic ramus.  Electronically Signed   By: Marijo Conception M.D.   On: 05/14/2022 18:14   DG Pelvis Comp Min 3V  Result Date: 05/14/2022 CLINICAL DATA:  Fracture right acetabulum EXAM: JUDET PELVIS - 3+ VIEW COMPARISON:  05/12/2022 FINDINGS: There is interval internal fixation of fracture of right acetabulum and right iliac bone. There is previous internal fixation in the right femur. IMPRESSION: Status post internal fixation of fracture of right acetabulum and right iliac bone. Electronically Signed   By: Elmer Picker M.D.   On: 05/14/2022 15:41   DG C-Arm 1-60 Min-No Report  Result Date: 05/14/2022 Fluoroscopy was utilized by the requesting physician.  No radiographic interpretation.   DG C-Arm 1-60 Min-No Report  Result Date: 05/14/2022 Fluoroscopy was utilized by the requesting physician.  No radiographic interpretation.    Positive ROS: All other systems have been reviewed and were otherwise negative with the exception of those mentioned in the HPI and as above.  Physical Exam: General: Alert, no acute distress Cardiovascular: No pedal edema Respiratory: No cyanosis, no use of accessory musculature GI: No organomegaly, abdomen is soft and non-tender Skin: No lesions in the area of chief complaint Neurologic: Sensation intact distally Psychiatric: Patient is competent for consent with normal mood and affect Lymphatic: No axillary or cervical lymphadenopathy  MUSCULOSKELETAL: Right upper extremity has arthroscopic portal sites that are nicely healed.  Otherwise neurovascular intact with moderate swelling.  Distally neurovascular intact.  Assessment: Right four-part proximal humerus fracture.  Plan: Again we discussed proceeding with reverse arthroplasty for definitive treatment for this complex fracture of the proximal humerus.  I do not see any real reason to attempt ORIF given high likelihood of AVN and nonhealing tuberosities.  Furthermore, she had a recent rotator cuff surgery that  would put her at risk for failure with fixation.  Therefore reverse arthroplasty is a good option.  We discussed the risk of bleeding, infection, damage to surrounding nerves and vessels, stiffness, failure of the tuberosity healing, dislocation, fracture and the risk of anesthesia.  She has provided informed consent.  -We will plan for sling for about 4 weeks postoperative.  However, she may remove the sling immediately to use the arm for weightbearing with her walker.  Otherwise I will see her in the office 2 weeks postop for x-rays of the right shoulder.    Nicholes Stairs, MD Cell (  [phone]    05/15/2022 5:47 PM

## 2022-05-15 NOTE — Anesthesia Procedure Notes (Signed)
Anesthesia Regional Block: Interscalene brachial plexus block   Pre-Anesthetic Checklist: , timeout performed,  Correct Patient, Correct Site, Correct Laterality,  Correct Procedure, Correct Position, site marked,  Risks and benefits discussed,  Pre-op evaluation,  At surgeon's request and post-op pain management  Laterality: Right  Prep: Maximum Sterile Barrier Precautions used, chloraprep       Needles:  Injection technique: Single-shot  Needle Type: Echogenic Stimulator Needle     Needle Length: 5cm  Needle Gauge: 22     Additional Needles:   Procedures:, nerve stimulator,,, ultrasound used (permanent image in chart),,     Nerve Stimulator or Paresthesia:  Response: Biceps response  Additional Responses:   Narrative:  Start time: 05/15/2022 5:35 PM End time: 05/15/2022 5:45 PM Injection made incrementally with aspirations every 5 mL.  Performed by: Personally  Anesthesiologist: Roderic Palau, MD

## 2022-05-15 NOTE — Evaluation (Signed)
Occupational Therapy Evaluation Patient Details Name: Carrie Barnes MRN: [id number] DOB: 10/19/1935 Today's Date: 05/15/2022   History of Present Illness 86 yr old female who presented 05/12/22 due to a fall in a parking lot. Pt found to have had an Acute comminuted fracture of the right acetabulum with extension into the iliac crest and acute fx of the R humeral head and neck.Pt s/p on 05/14/22 ORIF of R acetabular fx and ORIF of R superior pubic ramus fx.  PMH: CAD last cardiac stenting in 2018, GERD, HLD, HTN   Clinical Impression   Pt reported at PLOF living alone but would be able to have family support with the return to home. Pt at this timed was able to complete bed mobility with max assist for supine to sitting and sitting to supine. Pt was able to laterally scoot  at the EOB with max assist but was limited due to IV line on LUE placement and bleeding. Pt planning to go back into sx sometime today. Pt currently with functional limitations due to the deficits listed below (see OT Problem List).  Pt will benefit from skilled OT to increase their safety and independence with ADL and functional mobility for ADL to facilitate discharge to venue listed below.        Recommendations for follow up therapy are one component of a multi-disciplinary discharge planning process, led by the attending physician.  Recommendations may be updated based on patient status, additional functional criteria and insurance authorization.   Follow Up Recommendations  Skilled nursing-short term rehab (<3 hours/day)    Assistance Recommended at Discharge Frequent or constant Supervision/Assistance  Patient can return home with the following Two people to help with walking and/or transfers;A lot of help with bathing/dressing/bathroom;Assistance with cooking/housework;Assist for transportation    Functional Status Assessment  Patient has had a recent decline in their functional status and demonstrates the ability  to make significant improvements in function in a reasonable and predictable amount of time.  Equipment Recommendations  None recommended by OT (TBD)    Recommendations for Other Services       Precautions / Restrictions Precautions Precautions: Fall Restrictions Weight Bearing Restrictions: Yes RUE Weight Bearing: Non weight bearing (sling use) RLE Weight Bearing: Touchdown weight bearing      Mobility Bed Mobility Overal bed mobility: Needs Assistance Bed Mobility: Supine to Sit, Sit to Supine     Supine to sit: Max assist, HOB elevated Sit to supine: Max assist, HOB elevated        Transfers Overall transfer level: Needs assistance   Transfers: Sit to/from Stand Sit to Stand: Max assist, From elevated surface           General transfer comment: only able to clear about 2 in from surface to laterally scoot at EOB but was limited due to IV line on LUE at this time      Balance Overall balance assessment: Needs assistance Sitting-balance support: Feet supported, Single extremity supported Sitting balance-Leahy Scale: Fair     Standing balance support:  (unable to complete at this time)                               ADL either performed or assessed with clinical judgement   ADL Overall ADL's : Needs assistance/impaired Eating/Feeding: Set up;Sitting   Grooming: Moderate assistance;Sitting   Upper Body Bathing: Maximal assistance;Sitting   Lower Body Bathing: Maximal assistance;Cueing for safety;Cueing for sequencing;Sitting/lateral  leans   Upper Body Dressing : Maximal assistance;Sitting   Lower Body Dressing: Maximal assistance;Sitting/lateral leans                 General ADL Comments: Pt was able to complete lateral scoot at EOB with max assist but was limited as IV line was bleeding and decrease in ability to WB through LUE     Vision Baseline Vision/History: 1 Wears glasses       Perception     Praxis      Pertinent  Vitals/Pain Pain Assessment Pain Assessment: 0-10 Pain Score: 6  Pain Location: RUE/RLE Pain Descriptors / Indicators: Aching, Discomfort, Grimacing, Guarding, Operative site guarding Pain Intervention(s): Limited activity within patient's tolerance, Monitored during session, Repositioned     Hand Dominance Right   Extremity/Trunk Assessment Upper Extremity Assessment Upper Extremity Assessment: RUE deficits/detail RUE Deficits / Details: planning to go back to sx to have reverse total sx but at this time in sling and NWB RUE: Unable to fully assess due to immobilization RUE Sensation: WNL RUE Coordination: decreased fine motor;decreased gross motor   Lower Extremity Assessment Lower Extremity Assessment: Defer to PT evaluation       Communication Communication Communication: No difficulties   Cognition Arousal/Alertness: Awake/alert Behavior During Therapy: WFL for tasks assessed/performed Overall Cognitive Status: Within Functional Limits for tasks assessed                                       General Comments       Exercises     Shoulder Instructions      Home Living Family/patient expects to be discharged to:: Private residence Living Arrangements: Alone Available Help at Discharge: Family Type of Home: House Home Access: Level entry     Home Layout: One level     Bathroom Shower/Tub: Occupational psychologist: Standard     Home Equipment: Conservation officer, nature (2 wheels);Shower seat - built in          Prior Functioning/Environment Prior Level of Function : Independent/Modified Independent                        OT Problem List: Decreased strength;Decreased range of motion;Decreased activity tolerance;Impaired balance (sitting and/or standing);Decreased safety awareness;Decreased knowledge of use of DME or AE;Pain      OT Treatment/Interventions: Self-care/ADL training;Therapeutic exercise;DME and/or AE  instruction;Therapeutic activities;Patient/family education;Balance training    OT Goals(Current goals can be found in the care plan section) Acute Rehab OT Goals Patient Stated Goal: to keep moving OT Goal Formulation: With patient Time For Goal Achievement: 05/29/22 Potential to Achieve Goals: Good  OT Frequency: Min 2X/week    Co-evaluation              AM-PAC OT "6 Clicks" Daily Activity     Outcome Measure Help from another person eating meals?: A Little Help from another person taking care of personal grooming?: A Little Help from another person toileting, which includes using toliet, bedpan, or urinal?: A Lot Help from another person bathing (including washing, rinsing, drying)?: A Lot Help from another person to put on and taking off regular upper body clothing?: A Little Help from another person to put on and taking off regular lower body clothing?: A Lot 6 Click Score: 15   End of Session Equipment Utilized During Treatment: Rolling walker (2 wheels);Gait belt Nurse  Communication: Mobility status  Activity Tolerance: Patient tolerated treatment well Patient left: in bed;with call bell/phone within reach;with bed alarm set  OT Visit Diagnosis: Unsteadiness on feet (R26.81);Other abnormalities of gait and mobility (R26.89);Repeated falls (R29.6);Muscle weakness (generalized) (M62.81);Pain Pain - Right/Left: Right Pain - part of body: Shoulder;Leg                Time: 2505-3976 OT Time Calculation (min): 46 min Charges:  OT General Charges $OT Visit: 1 Visit OT Evaluation $OT Eval Low Complexity: 1 Low OT Treatments $Self Care/Home Management : 23-37 mins  Alphia Moh OTR/L  Acute Rehab Services  [phone] office number [phone] pager number   Alphia Moh 05/15/2022, 12:53 PM

## 2022-05-15 NOTE — Progress Notes (Signed)
Pt transferred back to room, post procedure. Denies pain . Vital signs stable. Family at bedside. Alert and oriented, answering questions appropriately.

## 2022-05-15 NOTE — Anesthesia Postprocedure Evaluation (Signed)
Anesthesia Post Note  Patient: Carrie Barnes  Procedure(s) Performed: RIGHT REVERSE SHOULDER ARTHROPLASTY (Right: Shoulder)     Patient location during evaluation: PACU Anesthesia Type: General Level of consciousness: awake and alert Pain management: pain level controlled Vital Signs Assessment: post-procedure vital signs reviewed and stable Respiratory status: spontaneous breathing, nonlabored ventilation, respiratory function stable and patient connected to nasal cannula oxygen Cardiovascular status: blood pressure returned to baseline and stable Postop Assessment: no apparent nausea or vomiting Anesthetic complications: no   No notable events documented.  Last Vitals:  Vitals:   05/15/22 2026 05/15/22 2041  BP: (!) 146/55 (!) 141/56  Pulse: 100 97  Resp: 14 15  Temp:  37.1 C  SpO2: 93% 93%    Last Pain:  Vitals:   05/15/22 2011  TempSrc:   PainSc: 0-No pain                 Santa Lighter

## 2022-05-15 NOTE — Evaluation (Signed)
Physical Therapy Evaluation Patient Details Name: Carrie Barnes MRN: [id number] DOB: 09-25-1935 Today's Date: 05/15/2022  History of Present Illness  86 yr old female who presented 05/12/22 due to a fall in a parking lot. Pt found to have had an Acute comminuted fracture of the right acetabulum with extension into the iliac crest and acute fx of the R humeral head and neck.Pt s/p on 05/14/22 ORIF of R acetabular fx and ORIF of R superior pubic ramus fx. Awaiting R reverse TSA. PMH: CAD last cardiac stenting in 2018, GERD, HLD, HTN  Clinical Impression  Patient admitted with the above. PTA, patient lives alone and was independent. Educated patient on TWB restriction on RLE and NWB on R UE and its impact on mobility, patient verbalized understanding. Patient required maxA for bed mobility and attempt at lateral scoot transfer but unable to complete due to lack of +2 assistance. Patient will benefit from skilled PT services during acute stay to address listed deficits. Recommend SNF at discharge to maximize functional mobility and safety. Will follow up post surgery for R reverse TSA.        Recommendations for follow up therapy are one component of a multi-disciplinary discharge planning process, led by the attending physician.  Recommendations may be updated based on patient status, additional functional criteria and insurance authorization.  Follow Up Recommendations Skilled nursing-short term rehab (<3 hours/day) Can patient physically be transported by private vehicle: No    Assistance Recommended at Discharge Frequent or constant Supervision/Assistance  Patient can return home with the following  Two people to help with walking and/or transfers;A lot of help with bathing/dressing/bathroom;Assistance with cooking/housework;Assist for transportation;Help with stairs or ramp for entrance    Equipment Recommendations Other (comment) (TBD)  Recommendations for Other Services       Functional  Status Assessment Patient has had a recent decline in their functional status and demonstrates the ability to make significant improvements in function in a reasonable and predictable amount of time.     Precautions / Restrictions Precautions Precautions: Fall Restrictions Weight Bearing Restrictions: Yes RUE Weight Bearing: Non weight bearing (sling use) RLE Weight Bearing: Touchdown weight bearing      Mobility  Bed Mobility Overal bed mobility: Needs Assistance Bed Mobility: Supine to Sit, Sit to Supine     Supine to sit: Max assist, HOB elevated Sit to supine: Max assist, HOB elevated        Transfers Overall transfer level: Needs assistance   Transfers: Bed to chair/wheelchair/BSC            Lateral/Scoot Transfers: Max assist General transfer comment: able to clear buttocks briefly but difficulty scooting towards HOB. Assist required for scooting    Ambulation/Gait                  Stairs            Wheelchair Mobility    Modified Rankin (Stroke Patients Only)       Balance Overall balance assessment: Needs assistance Sitting-balance support: Feet supported, Single extremity supported Sitting balance-Leahy Scale: Fair                                       Pertinent Vitals/Pain Pain Assessment Pain Assessment: 0-10 Pain Score: 8  Pain Location: RUE/RLE Pain Descriptors / Indicators: Aching, Discomfort, Grimacing, Guarding, Operative site guarding Pain Intervention(s): Limited activity within patient's tolerance, Monitored during session,  Repositioned    Home Living Family/patient expects to be discharged to:: Private residence Living Arrangements: Alone Available Help at Discharge: Family Type of Home: House Home Access: Level entry       Home Layout: One level Home Equipment: Agricultural consultant (2 wheels);Shower seat - built in      Prior Function Prior Level of Function : Independent/Modified Independent                      Hand Dominance   Dominant Hand: Right    Extremity/Trunk Assessment   Upper Extremity Assessment Upper Extremity Assessment: Defer to OT evaluation RUE Deficits / Details: planning to go back to sx to have reverse total sx but at this time in sling and NWB RUE: Unable to fully assess due to immobilization RUE Sensation: WNL RUE Coordination: decreased fine motor;decreased gross motor    Lower Extremity Assessment Lower Extremity Assessment: RLE deficits/detail RLE Deficits / Details: deficits consistent with post op pain and weakness; 1/5 strength       Communication   Communication: No difficulties  Cognition Arousal/Alertness: Awake/alert Behavior During Therapy: WFL for tasks assessed/performed Overall Cognitive Status: Within Functional Limits for tasks assessed                                          General Comments      Exercises     Assessment/Plan    PT Assessment Patient needs continued PT services  PT Problem List Decreased range of motion;Decreased strength;Decreased activity tolerance;Decreased balance;Decreased mobility;Decreased knowledge of use of DME;Decreased safety awareness;Decreased knowledge of precautions       PT Treatment Interventions DME instruction;Gait training;Functional mobility training;Therapeutic activities;Therapeutic exercise;Balance training;Patient/family education    PT Goals (Current goals can be found in the Care Plan section)  Acute Rehab PT Goals Patient Stated Goal: to get stronger PT Goal Formulation: With patient Time For Goal Achievement: 05/29/22 Potential to Achieve Goals: Good    Frequency Min 3X/week     Co-evaluation               AM-PAC PT "6 Clicks" Mobility  Outcome Measure Help needed turning from your back to your side while in a flat bed without using bedrails?: A Lot Help needed moving from lying on your back to sitting on the side of a flat bed  without using bedrails?: A Lot Help needed moving to and from a bed to a chair (including a wheelchair)?: Total Help needed standing up from a chair using your arms (e.g., wheelchair or bedside chair)?: Total Help needed to walk in hospital room?: Total Help needed climbing 3-5 steps with a railing? : Total 6 Click Score: 8    End of Session Equipment Utilized During Treatment: Oxygen Activity Tolerance: Patient limited by pain Patient left: in bed;with call bell/phone within reach;with family/visitor present Nurse Communication: Mobility status PT Visit Diagnosis: Muscle weakness (generalized) (M62.81);Unsteadiness on feet (R26.81);Other abnormalities of gait and mobility (R26.89)    Time: 1129-1150 PT Time Calculation (min) (ACUTE ONLY): 21 min   Charges:   PT Evaluation $PT Eval Moderate Complexity: 1 Mod          Almira Phetteplace A. Dan Humphreys PT, DPT Acute Rehabilitation Services Office [phone]   Viviann Spare 05/15/2022, 3:04 PM

## 2022-05-15 NOTE — TOC Benefit Eligibility Note (Signed)
Patient Teacher, English as a foreign language completed.    The patient is currently admitted and upon discharge could be taking Eliquis 2.5 mg.  The current 30 day co-pay is $141.81 due to being in Coverage Gap.   The patient is currently admitted and upon discharge could be taking Xarelto 10 mg.  The current 30 day co-pay is $137.19 due to being in Coverage Gap.   The patient is insured through [facility] Medicare Part d     Lyndel Safe, [place] Patient Advocate Specialist [place] Patient Advocate Team Direct Number: [phone]  Fax: [phone]

## 2022-05-15 NOTE — Transfer of Care (Signed)
Immediate Anesthesia Transfer of Care Note  Patient: Carrie Barnes  Procedure(s) Performed: RIGHT REVERSE SHOULDER ARTHROPLASTY (Right: Shoulder)  Patient Location: PACU  Anesthesia Type:GA combined with regional for post-op pain  Level of Consciousness: awake, alert , and oriented  Airway & Oxygen Therapy: Patient Spontanous Breathing and Patient connected to nasal cannula oxygen  Post-op Assessment: Report given to RN, Post -op Vital signs reviewed and stable, and Patient moving all extremities  Post vital signs: Reviewed and stable  Last Vitals:  Vitals Value Taken Time  BP 146/55 05/15/22 2015  Temp 37.2 C 05/15/22 2011  Pulse 99 05/15/22 2016  Resp 14 05/15/22 2016  SpO2 97 % 05/15/22 2016  Vitals shown include unvalidated device data.  Last Pain:  Vitals:   05/15/22 2011  TempSrc:   PainSc: 0-No pain      Patients Stated Pain Goal: 0 (62/26/33 3545)  Complications: No notable events documented.

## 2022-05-15 NOTE — H&P (Signed)
H&P update  The surgical history has been reviewed and remains accurate without interval change.  The patient was re-examined and patient's physiologic condition has not changed significantly in the last 30 days. The condition still exists that makes this procedure necessary. The treatment plan remains the same, without new options for care.  No new pharmacological allergies or types of therapy has been initiated that would change the plan or the appropriateness of the plan.  The patient and/or family understand the potential benefits and risks.  Yedidya Duddy P. Stann Mainland, MD 05/15/2022 5:50 PM

## 2022-05-15 NOTE — Progress Notes (Signed)
Carrie Barnes  IZT:[id number] DOB: [birth year] DOA: 05/12/2022 PCP: Seward Carol, MD    Brief Narrative:  86 year old with a history of CAD status post stenting in 2018, HLD, and HTN who suffered a mechanical fall in a parking lot after which she complained of severe pain in her hip.  In the ER x-rays revealed a comminuted fracture of the right humeral neck as well as an acute comminuted fracture of the right acetabulum with extension into the iliac crest.  Plan for the OR on 10/4 and again on 10/5.  Consultants:  Orthopedics  Cardiology   Goals of Care:  Code Status: Full Code   DVT prophylaxis: SCDs until post-op  Interim Hx: Swelling on left arm where IV is bleeding  Assessment & Plan:  Acute fractures of the right acetabulum and right humerus status post mechanical fall Care per orthopedic surgery -plan for OR on 10/4 and again 10/5  Coronary artery disease status post DES 2018 Clinically stable - no hx suggestive of USAP or CHF - was seen by Cards at request of Ortho, and Cards confirmed there is no need for further pre-operative evaluation   Hypomagnesemia Corrected with supplementation  Suboptimal B12 B12 320 - supplement with goal of 400+  HTN Blood pressure well controlled at present  HLD Continue usual dose of Lipitor  Chronic normocytic anemia Follow Hgb trend -iron studies suggestive of chronic disease with low iron state   Family Communication: no family present at time of exam  Disposition:  will depend upon performance w/ PT/OT postoperatively    Objective: Blood pressure 123/60, pulse 75, temperature 99.7 F (37.6 C), temperature source Oral, resp. rate 17, height 5\' 2"  (1.575 m), weight 70 kg, SpO2 93 %.  Intake/Output Summary (Last 24 hours) at 05/15/2022 1111 Last data filed at 05/15/2022 0447 Gross per 24 hour  Intake 2440 ml  Output 2350 ml  Net 90 ml   Filed Weights   05/12/22 1627  Weight: 70 kg    Examination:   General:  Appearance:     Overweight female in no acute distress     Lungs:     On [place], respirations unlabored  Heart:    Normal heart rate.  MS:   All extremities are intact.   Neurologic:   Awake, alert       CBC: Recent Labs  Lab 05/12/22 2055 05/14/22 0301 05/15/22 0247  WBC 16.2* 9.0 11.0*  NEUTROABS 13.7*  --   --   HGB 10.4* 9.2* 10.4*  HCT 30.1* 26.5* 29.4*  MCV 88.5 89.8 89.1  PLT 159 102* 382*   Basic Metabolic Panel: Recent Labs  Lab 05/12/22 2055 05/13/22 0145 05/13/22 0814 05/14/22 0301 05/15/22 0247  NA 139  --   --  133* 134*  K 3.6  --   --  4.0 4.4  CL 103  --   --  101 102  CO2 27  --   --  24 26  GLUCOSE 145*  --   --  131* 152*  BUN 12  --   --  9 10  CREATININE 0.88  --   --  0.75 0.80  CALCIUM 9.1  --   --  8.4* 8.0*  MG 1.6* 1.9 1.7 1.8  --   PHOS 3.7  --   --   --   --    GFR: Estimated Creatinine Clearance: 48 mL/min (by C-G formula based on SCr of 0.8 mg/dL).   Scheduled Meds:  acetaminophen  1,000 mg Oral Q6H   atorvastatin  80 mg Oral Daily   Chlorhexidine Gluconate Cloth  6 each Topical Daily   docusate sodium  100 mg Oral BID   feeding supplement  237 mL Oral BID BM   latanoprost  1 drop Both Eyes QHS   metoprolol succinate  25 mg Oral Daily   multivitamin with minerals  1 tablet Oral Daily   pantoprazole  40 mg Oral Daily   senna  1 tablet Oral BID   timolol  1 drop Left Eye BID   Urelle  1 tablet Oral QID   Continuous Infusions:  sodium chloride 75 mL/hr at 05/15/22 0620    ceFAZolin (ANCEF) IV 2 g (05/15/22 0515)   methocarbamol (ROBAXIN) IV       LOS: 3 days   Marlin Canary DO  If 7PM-7AM, please contact night-coverage per Amion 05/15/2022, 11:11 AM

## 2022-05-16 ENCOUNTER — Encounter (HOSPITAL_COMMUNITY): Payer: Self-pay | Admitting: Student

## 2022-05-16 ENCOUNTER — Inpatient Hospital Stay (HOSPITAL_COMMUNITY): Payer: Medicare Other

## 2022-05-16 DIAGNOSIS — S42201A Unspecified fracture of upper end of right humerus, initial encounter for closed fracture: Secondary | ICD-10-CM

## 2022-05-16 DIAGNOSIS — S32401A Unspecified fracture of right acetabulum, initial encounter for closed fracture: Secondary | ICD-10-CM | POA: Diagnosis not present

## 2022-05-16 DIAGNOSIS — W19XXXA Unspecified fall, initial encounter: Secondary | ICD-10-CM | POA: Diagnosis not present

## 2022-05-16 LAB — CBC
HCT: 21.4 % — ABNORMAL LOW (ref 36.0–46.0)
Hemoglobin: 7.4 g/dL — ABNORMAL LOW (ref 12.0–15.0)
MCH: 31.5 pg (ref 26.0–34.0)
MCHC: 34.6 g/dL (ref 30.0–36.0)
MCV: 91.1 fL (ref 80.0–100.0)
Platelets: 107 10*3/uL — ABNORMAL LOW (ref 150–400)
RBC: 2.35 MIL/uL — ABNORMAL LOW (ref 3.87–5.11)
RDW: 13.5 % (ref 11.5–15.5)
WBC: 8.7 10*3/uL (ref 4.0–10.5)
nRBC: 0 % (ref 0.0–0.2)

## 2022-05-16 LAB — BASIC METABOLIC PANEL
Anion gap: 6 (ref 5–15)
BUN: 10 mg/dL (ref 8–23)
CO2: 28 mmol/L (ref 22–32)
Calcium: 7.8 mg/dL — ABNORMAL LOW (ref 8.9–10.3)
Chloride: 104 mmol/L (ref 98–111)
Creatinine, Ser: 0.82 mg/dL (ref 0.44–1.00)
GFR, Estimated: >60 mL/min (ref >60)
Glucose, Bld: 136 mg/dL — ABNORMAL HIGH (ref 70–99)
Potassium: 4.3 mmol/L (ref 3.5–5.1)
Sodium: 138 mmol/L (ref 135–145)

## 2022-05-16 MED ORDER — METOPROLOL SUCCINATE ER 25 MG PO TB24
12.5000 mg | ORAL_TABLET | Freq: Every day | ORAL | Status: DC
  Administered 2022-05-17 – 2022-05-21 (×5): 12.5 mg via ORAL
  Filled 2022-05-16 (×5): qty 1

## 2022-05-16 NOTE — Progress Notes (Signed)
Carrie Barnes  EHM:[id number] DOB: 02/05/1936 DOA: 05/12/2022 PCP: Seward Carol, MD    Brief Narrative:  86 year old with a history of CAD status post stenting in 2018, HLD, and HTN who suffered a mechanical fall in a parking lot after which she complained of severe pain in her hip.  In the ER x-rays revealed a comminuted fracture of the right humeral neck as well as an acute comminuted fracture of the right acetabulum with extension into the iliac crest.  Plan for the OR on 10/4 for hip and again on 10/5 for shoulder.  Consultants:  Orthopedics  Cardiology   Goals of Care:  Code Status: Full Code   DVT prophylaxis: SCDs until post-op  Interim Hx: tired  Assessment & Plan:  Acute fractures of the right acetabulum and right humerus status post mechanical fall Care per orthopedic surgery -plan for OR on 10/4 and again 10/5 PT/OT pending  Coronary artery disease status post DES 2018 Clinically stable - no hx suggestive of USAP or CHF - was seen by Cards at request of Ortho, and Cards confirmed there is no need for further pre-operative evaluation   Hypomagnesemia Corrected with supplementation  Suboptimal B12 B12 320 - supplement with goal of 400+  HTN Blood pressure well controlled at present  HLD Continue usual dose of Lipitor  Chronic normocytic anemia -daily labs -given 2 units PRBC on 10/4   Family Communication: no family present at time of exam  Disposition:  will depend upon performance w/ PT/OT postoperatively    Objective: Blood pressure (!) 109/43, pulse 69, temperature 98.1 F (36.7 C), temperature source Oral, resp. rate 16, height 5\' 2"  (1.575 m), weight 70 kg, SpO2 99 %.  Intake/Output Summary (Last 24 hours) at 05/16/2022 1113 Last data filed at 05/16/2022 0556 Gross per 24 hour  Intake 1450 ml  Output 2450 ml  Net -1000 ml   Filed Weights   05/12/22 1627  Weight: 70 kg    Examination:   General: Appearance:     Overweight female in  no acute distress     Lungs:     On [place], respirations unlabored  Heart:    Normal heart rate.  MS:   All extremities are intact.   Neurologic:   Awake, alert       CBC: Recent Labs  Lab 05/12/22 2055 05/14/22 0301 05/14/22 1454 05/15/22 0247 05/16/22 0220  WBC 16.2* 9.0  --  11.0* 8.7  NEUTROABS 13.7*  --   --   --   --   HGB 10.4* 9.2* 6.5* 10.4* 7.4*  HCT 30.1* 26.5* 19.0* 29.4* 21.4*  MCV 88.5 89.8  --  89.1 91.1  PLT 159 102*  --  108* 366*   Basic Metabolic Panel: Recent Labs  Lab 05/12/22 2055 05/13/22 0145 05/13/22 0814 05/14/22 0301 05/14/22 1454 05/15/22 0247 05/16/22 0220  NA 139  --   --  133* 135 134* 138  K 3.6  --   --  4.0 4.9 4.4 4.3  CL 103  --   --  101  --  102 104  CO2 27  --   --  24  --  26 28  GLUCOSE 145*  --   --  131*  --  152* 136*  BUN 12  --   --  9  --  10 10  CREATININE 0.88  --   --  0.75  --  0.80 0.82  CALCIUM 9.1  --   --  8.4*  --  8.0* 7.8*  MG 1.6* 1.9 1.7 1.8  --   --   --   PHOS 3.7  --   --   --   --   --   --    GFR: Estimated Creatinine Clearance: 46.8 mL/min (by C-G formula based on SCr of 0.82 mg/dL).   Scheduled Meds:  acetaminophen  1,000 mg Oral Q6H   atorvastatin  80 mg Oral Daily   Chlorhexidine Gluconate Cloth  6 each Topical Daily   docusate sodium  100 mg Oral BID   feeding supplement  237 mL Oral BID BM   latanoprost  1 drop Both Eyes QHS   [START ON 05/17/2022] metoprolol succinate  12.5 mg Oral Daily   multivitamin with minerals  1 tablet Oral Daily   pantoprazole  40 mg Oral Daily   senna  1 tablet Oral BID   timolol  1 drop Left Eye BID   Urelle  1 tablet Oral QID   Continuous Infusions:   ceFAZolin (ANCEF) IV 1 g (05/16/22 0540)   methocarbamol (ROBAXIN) IV       LOS: 4 days   Marlin Canary DO  If 7PM-7AM, please contact night-coverage per Amion 05/16/2022, 11:13 AM

## 2022-05-16 NOTE — Plan of Care (Signed)
  Problem: Education: Goal: Knowledge of General Education information will improve Description Including pain rating scale, medication(s)/side effects and non-pharmacologic comfort measures Outcome: Progressing   Problem: Health Behavior/Discharge Planning: Goal: Ability to manage health-related needs will improve Outcome: Progressing   

## 2022-05-16 NOTE — Progress Notes (Signed)
   Subjective:  Carrie Barnes is a 86 y.o. female, 1 Day Post-Op   I s/p Procedure(s): RIGHT REVERSE SHOULDER ARTHROPLASTY   Patient reports pain as mild to moderate.  Having right hip and shoulder pain, reports right shoulder pain certainly increased once her block wore off.  Denies numbness or tingling.  Denies fever or chills.   Objective:   VITALS:   Vitals:   05/16/22 0318 05/16/22 0330 05/16/22 0628 05/16/22 0712  BP: (!) 93/48 (!) 108/51 111/63 (!) 109/43  Pulse: 72 69 71 69  Resp: 17   16  Temp: 98.6 F (37 C)   98.1 F (36.7 C)  TempSrc: Oral   Oral  SpO2: 95% 97%  99%  Weight:      Height:      Sitting in hospital bed no acute distress  Right upper extremity:   Neurovascular intact Sensation intact distally Intact pulses distally Incision: dressing C/D/I No cellulitis present Able to move the elbow, wrist, and digits without difficulty.  Ecchymosis of the right upper extremity.  Aquacel present without drainage. Sling present.  Bilateral calves soft, nontender  Lab Results  Component Value Date   WBC 8.7 05/16/2022   HGB 7.4 (L) 05/16/2022   HCT 21.4 (L) 05/16/2022   MCV 91.1 05/16/2022   PLT 107 (L) 05/16/2022   BMET    Component Value Date/Time   NA 138 05/16/2022 0220   NA 137 02/07/2020 1112   K 4.3 05/16/2022 0220   CL 104 05/16/2022 0220   CO2 28 05/16/2022 0220   GLUCOSE 136 (H) 05/16/2022 0220   BUN 10 05/16/2022 0220   BUN 13 02/07/2020 1112   CREATININE 0.82 05/16/2022 0220   CALCIUM 7.8 (L) 05/16/2022 0220   GFRNONAA >60 05/16/2022 0220     Assessment/Plan: 1 Day Post-Op   Principal Problem:   Pelvis fracture (HCC) Active Problems:   CAD in native artery   Ischemic cardiomyopathy   Hyperlipemia   Osteoporosis   Hypertension   Humeral head fracture, right, closed, initial encounter   Elevated troponin   Hematoma   Hypomagnesemia   Advance diet Up with therapy   Weightbearing Status: Nonweightbearing in the  sling.  She will be allowed to be weightbearing while using a walker but otherwise in the sling for the next month.  She can come out of the sling to work on pendulums, scapular retractions, elbow, wrist, and digit range of motion. DVT Prophylaxis: Per orthopedic trauma, Lovenox  Dispo: SNF versus CIR  Follow-up in the office at [facility] in 2 weeks with myself or Dr. Stann Mainland for wound check, x-rays.  Faythe Casa 05/16/2022, 12:44 PM  Jonelle Sidle PA-C  Physician Assistant with Dr. Lillia Abed [place]

## 2022-05-16 NOTE — Evaluation (Signed)
Clinical/Bedside Swallow Evaluation Patient Details  Name: Carrie Barnes MRN: [id number] Date of Birth: 09/23/1935  Today's Date: 05/16/2022 Time: SLP Start Time (ACUTE ONLY): 0930 SLP Stop Time (ACUTE ONLY): 1779 SLP Time Calculation (min) (ACUTE ONLY): 22 min  Past Medical History:  Past Medical History:  Diagnosis Date   Cholecystitis    Coronary artery disease    a. 03/2017: 80-90% LAD stenosis (PCI/DES placement with a 2.75x16 mm Promus Premier stent). No significant stenosis along RCA or LCx.    DJD (degenerative joint disease)    GERD (gastroesophageal reflux disease)    Hyperlipidemia    Hypertension    dx s/p MI    Ischemic cardiomyopathy    Myocardial infarction ([place]) 03/17/2017   03-2017 treated at [facility] [place] [facility]    Osteoporosis    Status post insertion of drug-eluting stent into left anterior descending (LAD) artery 03/2017   Past Surgical History:  Past Surgical History:  Procedure Laterality Date   APPENDECTOMY     CESAREAN SECTION     CHOLECYSTECTOMY N/A 11/05/2017   Procedure: LAPAROSCOPIC CHOLECYSTECTOMY WITH INTRAOPERATIVE CHOLANGIOGRAM;  Surgeon: Armandina Gemma, MD;  Location: [facility];  Service: General;  Laterality: N/A;   ERCP N/A 11/11/2017   Procedure: ENDOSCOPIC RETROGRADE CHOLANGIOPANCREATOGRAPHY (ERCP);  Surgeon: Arta Silence, MD;  Location: [facility];  Service: Endoscopy;  Laterality: N/A;  possible   EUS N/A 11/11/2017   Procedure: UPPER ENDOSCOPIC ULTRASOUND (EUS) RADIAL;  Surgeon: Arta Silence, MD;  Location: [facility];  Service: Endoscopy;  Laterality: N/A;   FEMUR IM NAIL Right 07/13/2019   Procedure: INTRAMEDULLARY (IM) RETROGRADE FEMORAL NAILING;  Surgeon: Gaynelle Arabian, MD;  Location: [facility];  Service: Orthopedics;  Laterality: Right;  44mn   LUMBAR LAMINECTOMY/ DECOMPRESSION WITH MET-RX     OPEN REDUCTION INTERNAL FIXATION ACETABULAR FRACTURE STOPPA Right 05/14/2022   Procedure: OPEN REDUCTION INTERNAL FIXATION RIGHT  ACETABULUM;  Surgeon: HShona Needles MD;  [facility]  Service: Orthopedics;  Laterality: Right;   SHOULDER ARTHROSCOPY WITH ROTATOR CUFF REPAIR AND SUBACROMIAL DECOMPRESSION Right 09/20/2021   Procedure: SHOULDER MINI OPEN ROTATOR CUFF REPAIR AND SUBACROMIAL DECOMPRESSION WITH POSSIBLE PATCH GRAFT;  Surgeon: BSusa Day MD;  Location: [facility];  Service: Orthopedics;  Laterality: Right;  95MINS   stent  Left 03/2017   anterior descending ; drug eluting ; [facility] of MI at [facility]    TONSILLECTOMY     VAGINAL HYSTERECTOMY     vaginal sling     HPI:  Pt is an 86yo female presenting s/p mechanical fall in parking lot. Pt found to have acute fxs of the R acetabulum and R humerus s/p ORIF 10/4 and reverse TSA 10/5. PMH also includes: GERD, CAD, HLD, HTN. Pt also reports having her esophagus stretched in the past.    Assessment / Plan / Recommendation  Clinical Impression  Pt reports a h/o esophageal dysphagia requiring stretching of her esophagus x1 to alleviate dysphagia to pills. She also takes reflux medications at home (also taking protonix here), sharing that her reflux is "real bad" otherwise. She denies any subjective symptoms PTA or since procedures. As her baseline esophageal dysphagia is mild and more chronic in nature, she can verbalize the strategies she uses at home to help and SLP provided education about a few additions strategies to try. Will leave on regular solids and thin liquids as tolerated. Pt in agreement with SLP s/o acutely. Please reorder with any acute changes. SLP Visit Diagnosis: Dysphagia,  unspecified (R13.10)    Aspiration Risk  Mild aspiration risk    Diet Recommendation Regular;Thin liquid   Liquid Administration via: Cup;Straw Medication Administration: Whole meds with liquid Supervision: Patient able to self feed;Intermittent supervision to cue for compensatory strategies Compensations: Slow rate;Small sips/bites;Follow solids with  liquid Postural Changes: Seated upright at 90 degrees;Remain upright for at least 30 minutes after po intake    Other  Recommendations Oral Care Recommendations: Oral care BID    Recommendations for follow up therapy are one component of a multi-disciplinary discharge planning process, led by the attending physician.  Recommendations may be updated based on patient status, additional functional criteria and insurance authorization.  Follow up Recommendations No SLP follow up      Assistance Recommended at Discharge PRN  Functional Status Assessment Patient has not had a recent decline in their functional status  Frequency and Duration            Prognosis Prognosis for Safe Diet Advancement: Good      Swallow Study   General HPI: Pt is an 86 yo female presenting s/p mechanical fall in parking lot. Pt found to have acute fxs of the R acetabulum and R humerus s/p ORIF 10/4 and reverse TSA 10/5. PMH also includes: GERD, CAD, HLD, HTN. Pt also reports having her esophagus stretched in the past. Type of Study: Bedside Swallow Evaluation Previous Swallow Assessment: none in chart Diet Prior to this Study: Regular;Thin liquids Temperature Spikes Noted: No Respiratory Status: Nasal cannula History of Recent Intubation:  (for surgery) Behavior/Cognition: Alert;Cooperative;Pleasant mood Oral Cavity Assessment: Within Functional Limits Oral Care Completed by SLP: No Oral Cavity - Dentition: Dentures, bottom;Dentures, top Vision: Functional for self-feeding Self-Feeding Abilities: Able to feed self Patient Positioning: Upright in bed (minimally reclined - as upright as could tolerate) Baseline Vocal Quality: Hoarse (pt says "very hoarse" and a little deeper but sounds clear to unfamiliar listener) Volitional Cough: Strong Volitional Swallow: Able to elicit    Oral/Motor/Sensory Function Overall Oral Motor/Sensory Function: Within functional limits   Ice Chips Ice chips: Not tested    Thin Liquid Thin Liquid: Within functional limits Presentation: Self Fed;Straw    Nectar Thick Nectar Thick Liquid: Not tested   Honey Thick Honey Thick Liquid: Not tested   Puree Puree: Within functional limits Presentation: Self Fed;Spoon   Solid     Solid: Within functional limits Presentation: Self Fed      Osie Bond., M.A. [place] Office [phone]  Secure chat preferred  05/16/2022,9:59 AM

## 2022-05-16 NOTE — Progress Notes (Signed)
Occupational Therapy Treatment Patient Details Name: Carrie Barnes MRN: [id number] DOB: August 03, 1936 Today's Date: 05/16/2022   History of present illness 86 yr old female who presented 05/12/22 due to a fall in a parking lot. Pt found to have had an Acute comminuted fracture of the right acetabulum with extension into the iliac crest and acute fx of the R humeral head and neck.Pt s/p on 05/14/22 ORIF of R acetabular fx and ORIF of R superior pubic ramus fx. Awaiting R reverse TSA. PMH: CAD last cardiac stenting in 2018, GERD, HLD, HTN   OT comments  Patient continues to make steady progress towards goals in skilled OT session. Patient's session encompassed bed mobility, incremental scooting, and transfer to [facility]. OT educating patient on new WB precautions for RUE, with patient adamant that someone came in her room earlier and told her not to do anything with her RUE until a few weeks. PA contacted after session for clarification. Patient max A of 2 in order to complete [facility] transfer, with need for cues and OTs foot under RLE to keep WB status. Patient left on [facility] at end of session with RN aware. OT will continue to follow acutely.     Recommendations for follow up therapy are one component of a multi-disciplinary discharge planning process, led by the attending physician.  Recommendations may be updated based on patient status, additional functional criteria and insurance authorization.    Follow Up Recommendations  Skilled nursing-short term rehab (<3 hours/day)    Assistance Recommended at Discharge Frequent or constant Supervision/Assistance  Patient can return home with the following  Two people to help with walking and/or transfers;A lot of help with bathing/dressing/bathroom;Assistance with cooking/housework;Assist for transportation   Equipment Recommendations  None recommended by OT (defer to next venue)    Recommendations for Other Services      Precautions / Restrictions  Precautions Precautions: Fall Restrictions Weight Bearing Restrictions: Yes RUE Weight Bearing: Non weight bearing RLE Weight Bearing: Touchdown weight bearing Other Position/Activity Restrictions: seeking clarification on orders for RUE       Mobility Bed Mobility Overal bed mobility: Needs Assistance Bed Mobility: Supine to Sit     Supine to sit: Max assist, HOB elevated, +2 for physical assistance, +2 for safety/equipment     General bed mobility comments: minimal to no initiation to come into sitting EOB, need for max cues to motor plan positioning and use of bed rails, waxing and waning cognition noted with attempt    Transfers Overall transfer level: Needs assistance   Transfers: Bed to chair/wheelchair/BSC Sit to Stand: Max assist, +2 physical assistance, +2 safety/equipment           General transfer comment: patient benefitting from OT placing foot under R foot to maintain weight bearing status, able to pivot L foot to transfer to [facility], patient left sitting on BSC as patient had over 500 mls of urine in bladder     Balance Overall balance assessment: Needs assistance Sitting-balance support: Feet supported, Single extremity supported Sitting balance-Leahy Scale: Fair                                     ADL either performed or assessed with clinical judgement   ADL Overall ADL's : Needs assistance/impaired             Lower Body Bathing: Maximal assistance;Cueing for safety;Cueing for sequencing;Sitting/lateral leans   Upper Body Dressing :  Maximal assistance;Sitting   Lower Body Dressing: Maximal assistance;Sitting/lateral leans   Toilet Transfer: Moderate assistance;+2 for physical assistance;+2 for safety/equipment;Stand-pivot;BSC/3in1 Toilet Transfer Details (indicate cue type and reason): stand pivot to BSC, good adherence to WB precautions with RLE, max cues to sequence         Functional mobility during ADLs: Maximal  assistance;+2 for physical assistance;+2 for safety/equipment General ADL Comments: Session focus on BSC transfers, lateral scoots and increasing functional independence    Extremity/Trunk Assessment              Vision       Perception     Praxis      Cognition Arousal/Alertness: Awake/alert Behavior During Therapy: WFL for tasks assessed/performed Overall Cognitive Status: Impaired/Different from baseline Area of Impairment: Attention, Memory, Following commands, Safety/judgement, Awareness, Problem solving, Orientation                 Orientation Level: Time Current Attention Level: Sustained Memory: Decreased recall of precautions, Decreased short-term memory Following Commands: Follows one step commands consistently, Follows multi-step commands inconsistently Safety/Judgement: Decreased awareness of safety, Decreased awareness of deficits Awareness: Intellectual Problem Solving: Requires verbal cues, Requires tactile cues, Difficulty sequencing General Comments: Patient tangential in speech, often laughing off deficits, more than likely due to anethesia due to back to back surgeries but not patient's baseline        Exercises      Shoulder Instructions       General Comments      Pertinent Vitals/ Pain       Pain Assessment Pain Assessment: Faces Faces Pain Scale: Hurts little more Pain Location: R hip Pain Descriptors / Indicators: Sore Pain Intervention(s): Limited activity within patient's tolerance, Monitored during session, Repositioned  Home Living                                          Prior Functioning/Environment              Frequency  Min 2X/week        Progress Toward Goals  OT Goals(current goals can now be found in the care plan section)  Progress towards OT goals: Progressing toward goals  Acute Rehab OT Goals Patient Stated Goal: to feel better OT Goal Formulation: With patient Time For Goal  Achievement: 05/29/22 Potential to Achieve Goals: Good  Plan Discharge plan remains appropriate    Co-evaluation                 AM-PAC OT "6 Clicks" Daily Activity     Outcome Measure   Help from another person eating meals?: A Little Help from another person taking care of personal grooming?: A Little Help from another person toileting, which includes using toliet, bedpan, or urinal?: A Lot Help from another person bathing (including washing, rinsing, drying)?: A Lot Help from another person to put on and taking off regular upper body clothing?: A Little Help from another person to put on and taking off regular lower body clothing?: A Lot 6 Click Score: 15    End of Session Equipment Utilized During Treatment: Gait belt;Other (comment) (sling)  OT Visit Diagnosis: Unsteadiness on feet (R26.81);Other abnormalities of gait and mobility (R26.89);Repeated falls (R29.6);Muscle weakness (generalized) (M62.81);Pain Pain - Right/Left: Right Pain - part of body: Shoulder;Leg   Activity Tolerance Patient tolerated treatment well   Patient Left with family/visitor present;with call bell/phone  within reach (on [facility])   Nurse Communication Mobility status        Time: 4268-3419 OT Time Calculation (min): 32 min  Charges: OT General Charges $OT Visit: 1 Visit OT Treatments $Self Care/Home Management : 8-22 mins  Pollyann Glen E. Kamyah Wilhelmsen, OTR/L Acute Rehabilitation Services [phone]   Cherlyn Cushing 05/16/2022, 3:55 PM

## 2022-05-16 NOTE — Progress Notes (Signed)
Orthopaedic Trauma Progress Note  SUBJECTIVE: Doing ok today, felling tired this morning. Having pain in her right hip as well as her right shoulder. Having a hard time mobilizing in bed. No chest pain. No SOB. No nausea/vomiting. No other complaints.  Helped NT assist patient back to bed from bedside commode.  OBJECTIVE:  Vitals:   05/16/22 0628 05/16/22 0712  BP: 111/63 (!) 109/43  Pulse: 71 69  Resp:  16  Temp:  98.1 F (36.7 C)  SpO2:  99%    General: Sitting up in bed, no acute distress.  Pleasant and cooperative Respiratory: No increased work of breathing.  Pelvis/right lower extremity: Dressing clean, dry, intact.  No significant tenderness with palpation over the hip.  Tolerates gentle motion of the knee.  Ankle DF/PF intact.  Compartment soft compressible.  Neurovascularly intact Right upper extremity: Sling in place. Dressing CDI. Mild bruising over the shoulder and to the upper arm.  Able to wiggle fingers.  Endorses sensation throughout extremity.  Neurovascularly intact.  IMAGING: Stable post op imaging right acetabulum  LABS:  Results for orders placed or performed during the hospital encounter of 05/12/22 (from the past 24 hour(s))  CBC     Status: Abnormal   Collection Time: 05/16/22  2:20 AM  Result Value Ref Range   WBC 8.7 4.0 - 10.5 K/uL   RBC 2.35 (L) 3.87 - 5.11 MIL/uL   Hemoglobin 7.4 (L) 12.0 - 15.0 g/dL   HCT 21.4 (L) 36.0 - 46.0 %   MCV 91.1 80.0 - 100.0 fL   MCH 31.5 26.0 - 34.0 pg   MCHC 34.6 30.0 - 36.0 g/dL   RDW 13.5 11.5 - 15.5 %   Platelets 107 (L) 150 - 400 K/uL   nRBC 0.0 0.0 - 0.2 %  Basic metabolic panel     Status: Abnormal   Collection Time: 05/16/22  2:20 AM  Result Value Ref Range   Sodium 138 135 - 145 mmol/L   Potassium 4.3 3.5 - 5.1 mmol/L   Chloride 104 98 - 111 mmol/L   CO2 28 22 - 32 mmol/L   Glucose, Bld 136 (H) 70 - 99 mg/dL   BUN 10 8 - 23 mg/dL   Creatinine, Ser 0.82 0.44 - 1.00 mg/dL   Calcium 7.8 (L) 8.9 - 10.3 mg/dL    GFR, Estimated >60 >60 mL/min   Anion gap 6 5 - 15    ASSESSMENT: Carrie Barnes is a 86 y.o. female, 1 Day Post-Op s/p  OPEN REDUCTION INTERNAL FIXATION RIGHT ACETABULUM by Dr. Doreatha Martin 05/14/22 RIGHT REVERSE SHOULDER ARTHOPLASTY by Dr. Stann Mainland 05/15/22  CV/Blood loss: Acute blood loss anemia, Hgb 7.4 this morning.  Received 2 units PRBCs intraoperatively 05/14/2022. Hemodynamically stable  PLAN: Weightbearing:  - RLE: TDWB  - RUE: WBAT only when using walker. Otherwise NWB in sling ROM:  - RLE: Unrestricted hip and knee ROM - RUE: hand, wrist elbow ROM as tolerated. Pendulums and scapular retractions ok Incisional and dressing care: Reinforce dressings as needed  Showering: Okay to begin getting pelvic incision wet 05/17/2022 Orthopedic device(s):  - RLE: None  - RUE: Sling Pain management:  1. Tylenol 1000 mg q 6 hours scheduled 2. Robaxin 500 mg q 6 hours PRN 3. Oxycodone 5-10 mg q 4 hours PRN 4. Morphine 1-2 mg q 2 hours PRN VTE prophylaxis:  Hold Lovenox until Hgb stabilizes , SCDs ID:  Ancef 2gm post op completed Foley/Lines: Foley in place. KVO IVFs Impediments to Fracture Healing:  Vit D level 42, no supplementation indicated Dispo: Therapies as tolerated, will likely require SNF vs CIR. Plan to start Lovenox once Hgb stabilizes  D/C recommendations: - Oxycodone and Robaxin for pain control - Switch to Eliquis 2.5 mg BID x 30 days for DVT prophylaxis - No need for Vit D supplementation  Follow - up plan: 2 weeks with Dr. Jena Gauss for repeat x-rays and wound check R hip, 2 weeks with Dr. Aundria Rud for R shoulder   Contact information:  Truitt Merle MD, Thyra Breed PA-C. After hours and holidays please check [email or web address] for group call information for [facility]   Thompson Caul, PA-C [phone] (office) [email or web address]

## 2022-05-16 NOTE — Progress Notes (Signed)
Physical Therapy Treatment Patient Details Name: Carrie Barnes MRN: [id number] DOB: [birth year] Today's Date: 05/16/2022   History of Present Illness 86 yr old female who presented 05/12/22 due to a fall in a parking lot. Pt found to have had an Acute comminuted fracture of the right acetabulum with extension into the iliac crest and acute fx of the R humeral head and neck.Pt s/p on 05/14/22 ORIF of R acetabular fx and ORIF of R superior pubic ramus fx. Awaiting R reverse TSA. PMH: CAD last cardiac stenting in 2018, GERD, HLD, HTN    PT Comments    Patient progressing towards physical therapy goals. Patient with conflicting orders for R UE WB and ROM restrictions, OT to message MD to clarify. Patient required maxA+2 for bed mobility and squat pivot transfer to [facility]. Required therapist foot under R LE to monitor WB as patient putting ~25% of weight through R LE. Patient left on [facility] at end of session, RN present and aware. Continue to recommend SNF for ongoing Physical Therapy.       Recommendations for follow up therapy are one component of a multi-disciplinary discharge planning process, led by the attending physician.  Recommendations may be updated based on patient status, additional functional criteria and insurance authorization.  Follow Up Recommendations  Skilled nursing-short term rehab (<3 hours/day) Can patient physically be transported by private vehicle: No   Assistance Recommended at Discharge Frequent or constant Supervision/Assistance  Patient can return home with the following Two people to help with walking and/or transfers;A lot of help with bathing/dressing/bathroom;Assistance with cooking/housework;Assist for transportation;Help with stairs or ramp for entrance   Equipment Recommendations  Other (comment) (TBD)    Recommendations for Other Services       Precautions / Restrictions Precautions Precautions: Fall Restrictions Weight Bearing Restrictions: Yes RUE Weight  Bearing: Non weight bearing RLE Weight Bearing: Touchdown weight bearing Other Position/Activity Restrictions: Per Dr. Thereasa Solo, able to use [place] with R UE only     Mobility  Bed Mobility Overal bed mobility: Needs Assistance Bed Mobility: Supine to Sit     Supine to sit: Max assist, HOB elevated, +2 for physical assistance, +2 for safety/equipment     General bed mobility comments: minimal to no initiation to come into sitting EOB, need for max cues to motor plan positioning and use of bed rails, waxing and waning cognition noted with attempt    Transfers Overall transfer level: Needs assistance   Transfers: Bed to chair/wheelchair/BSC, Sit to/from Stand Sit to Stand: Max assist, +2 physical assistance, +2 safety/equipment     Squat pivot transfers: Max assist, +2 physical assistance, +2 safety/equipment     General transfer comment: patient benefitting from therapist placing foot under R foot to maintain weight bearing status, able to pivot L foot to transfer to [facility], patient left sitting on BSC as patient had over 500 mls of urine in bladder. RN present    Ambulation/Gait                   Stairs             Wheelchair Mobility    Modified Rankin (Stroke Patients Only)       Balance Overall balance assessment: Needs assistance Sitting-balance support: Feet supported, Single extremity supported Sitting balance-Leahy Scale: Fair  Cognition Arousal/Alertness: Awake/alert Behavior During Therapy: WFL for tasks assessed/performed Overall Cognitive Status: Impaired/Different from baseline Area of Impairment: Attention, Memory, Following commands, Safety/judgement, Awareness, Problem solving, Orientation                 Orientation Level: Time Current Attention Level: Sustained Memory: Decreased recall of precautions, Decreased short-term memory Following Commands: Follows one step commands  consistently, Follows multi-step commands inconsistently Safety/Judgement: Decreased awareness of safety, Decreased awareness of deficits Awareness: Intellectual Problem Solving: Requires verbal cues, Requires tactile cues, Difficulty sequencing General Comments: Patient tangential in speech, often laughing off deficits, more than likely due to anethesia due to back to back surgeries but not patient's baseline        Exercises      General Comments        Pertinent Vitals/Pain Pain Assessment Pain Assessment: Faces Faces Pain Scale: Hurts little more Pain Location: R hip Pain Descriptors / Indicators: Sore Pain Intervention(s): Monitored during session    Home Living                          Prior Function            PT Goals (current goals can now be found in the care plan section) Acute Rehab PT Goals Patient Stated Goal: to get stronger PT Goal Formulation: With patient Time For Goal Achievement: 05/29/22 Potential to Achieve Goals: Good Progress towards PT goals: Progressing toward goals    Frequency    Min 3X/week      PT Plan Current plan remains appropriate    Co-evaluation              AM-PAC PT "6 Clicks" Mobility   Outcome Measure  Help needed turning from your back to your side while in a flat bed without using bedrails?: A Lot Help needed moving from lying on your back to sitting on the side of a flat bed without using bedrails?: A Lot Help needed moving to and from a bed to a chair (including a wheelchair)?: Total Help needed standing up from a chair using your arms (e.g., wheelchair or bedside chair)?: Total Help needed to walk in hospital room?: Total Help needed climbing 3-5 steps with a railing? : Total 6 Click Score: 8    End of Session Equipment Utilized During Treatment: Gait belt;Oxygen Activity Tolerance: Patient tolerated treatment well Patient left:  (on [facility], RN present) Nurse Communication: Mobility status PT  Visit Diagnosis: Muscle weakness (generalized) (M62.81);Unsteadiness on feet (R26.81);Other abnormalities of gait and mobility (R26.89)     Time: 1443-1540 PT Time Calculation (min) (ACUTE ONLY): 32 min  Charges:  $Therapeutic Activity: 8-22 mins                     Davinci Glotfelty A. Dan Humphreys PT, DPT Acute Rehabilitation Services Office [phone]    Viviann Spare 05/16/2022, 5:10 PM

## 2022-05-16 NOTE — Discharge Instructions (Addendum)
[facility] DC instructions for right shoulder:  Orthopedic surgery discharge instructions:  -Maintain postoperative bandage until follow-up appointment.  This is waterproof, and you may begin showering on postoperative day #3.  Do not submerge underwater.  Maintain that bandage until your follow-up appointment in 2 weeks.  -No lifting over 2 pounds with operateive arm.  You may use the arm immediately for activities of daily living such as bathing, washing your face and brushing your teeth, eating, and getting dressed.  Otherwise maintain your sling when you are out of the house and sleeping.  -Apply ice liberally to the shoulder throughout the day.  For mild to moderate pain use Tylenol and Advil as needed around-the-clock.  For breakthrough pain use oxycodone as necessary.  -You will return to see Dr. Stann Mainland in the office in 2 weeks for routine postoperative check with x-rays.    Orthopaedic Trauma Service Discharge Instructions   General Discharge Instructions  WEIGHT BEARING STATUS: - RLE: TDWB  - RUE: WBAT only when using walker. Otherwise NWB in sling  RANGE OF MOTION/ACTIVITY: - RLE: Unrestricted hip and knee ROM - RUE: Hand, wrist elbow ROM as tolerated. Pendulums and scapular retractions ok  Wound Care: Incision to the anterior pelvis can be left open to air. Ok to get incision wet when showering, clean gently with soap and water  DVT/PE prophylaxis:  Eliquis twice daily x 30 days  Diet: as you were eating previously.  Can use over the counter stool softeners and bowel preparations, such as Miralax, to help with bowel movements.  Narcotics can be constipating.  Be sure to drink plenty of fluids  PAIN MEDICATION USE AND EXPECTATIONS  You have likely been given narcotic medications to help control your pain.  After a traumatic event that results in an fracture (broken bone) with or without surgery, it is ok to use narcotic pain medications to help control one's pain.  We  understand that everyone responds to pain differently and each individual patient will be evaluated on a regular basis for the continued need for narcotic medications. Ideally, narcotic medication use should last no more than 6-8 weeks (coinciding with fracture healing).   As a patient it is your responsibility as well to monitor narcotic medication use and report the amount and frequency you use these medications when you come to your office visit.   We would also advise that if you are using narcotic medications, you should take a dose prior to therapy to maximize you participation.  IF YOU ARE ON NARCOTIC MEDICATIONS IT IS NOT PERMISSIBLE TO OPERATE A MOTOR VEHICLE (MOTORCYCLE/CAR/TRUCK/MOPED) OR HEAVY MACHINERY DO NOT MIX NARCOTICS WITH OTHER CNS (CENTRAL NERVOUS SYSTEM) DEPRESSANTS SUCH AS ALCOHOL   STOP SMOKING OR USING NICOTINE PRODUCTS!!!!  As discussed nicotine severely impairs your body's ability to heal surgical and traumatic wounds but also impairs bone healing.  Wounds and bone heal by forming microscopic blood vessels (angiogenesis) and nicotine is a vasoconstrictor (essentially, shrinks blood vessels).  Therefore, if vasoconstriction occurs to these microscopic blood vessels they essentially disappear and are unable to deliver necessary nutrients to the healing tissue.  This is one modifiable factor that you can do to dramatically increase your chances of healing your injury.    (This means no smoking, no nicotine gum, patches, etc)  DO NOT USE NONSTEROIDAL ANTI-INFLAMMATORY DRUGS (NSAID'S)  Using products such as Advil (ibuprofen), Aleve (naproxen), Motrin (ibuprofen) for additional pain control during fracture healing can delay and/or prevent the healing response.  If  you would like to take over the counter (OTC) medication, Tylenol (acetaminophen) is ok.  However, some narcotic medications that are given for pain control contain acetaminophen as well. Therefore, you should not exceed  more than 4000 mg of tylenol in a day if you do not have liver disease.  Also note that there are may OTC medicines, such as cold medicines and allergy medicines that my contain tylenol as well.  If you have any questions about medications and/or interactions please ask your doctor/PA or your pharmacist.      ICE AND ELEVATE INJURED/OPERATIVE EXTREMITY  Using ice and elevating the injured extremity above your heart can help with swelling and pain control.  Icing in a pulsatile fashion, such as 20 minutes on and 20 minutes off, can be followed.    Do not place ice directly on skin. Make sure there is a barrier between to skin and the ice pack.    Using frozen items such as frozen peas works well as the conform nicely to the are that needs to be iced.  USE AN ACE WRAP OR TED HOSE FOR SWELLING CONTROL  In addition to icing and elevation, Ace wraps or TED hose are used to help limit and resolve swelling.  It is recommended to use Ace wraps or TED hose until you are informed to stop.    When using Ace Wraps start the wrapping distally (farthest away from the body) and wrap proximally (closer to the body)   Example: If you had surgery on your leg or thing and you do not have a splint on, start the ace wrap at the toes and work your way up to the thigh        If you had surgery on your upper extremity and do not have a splint on, start the ace wrap at your fingers and work your way up to the upper arm  [place]: [phone]   VISIT OUR WEBSITE FOR ADDITIONAL INFORMATION: [email or web address]

## 2022-05-17 DIAGNOSIS — S32401A Unspecified fracture of right acetabulum, initial encounter for closed fracture: Secondary | ICD-10-CM | POA: Diagnosis not present

## 2022-05-17 DIAGNOSIS — S42201A Unspecified fracture of upper end of right humerus, initial encounter for closed fracture: Secondary | ICD-10-CM | POA: Diagnosis not present

## 2022-05-17 LAB — BASIC METABOLIC PANEL
Anion gap: 4 — ABNORMAL LOW (ref 5–15)
BUN: 11 mg/dL (ref 8–23)
CO2: 31 mmol/L (ref 22–32)
Calcium: 8.2 mg/dL — ABNORMAL LOW (ref 8.9–10.3)
Chloride: 103 mmol/L (ref 98–111)
Creatinine, Ser: 0.71 mg/dL (ref 0.44–1.00)
GFR, Estimated: >60 mL/min (ref >60)
Glucose, Bld: 131 mg/dL — ABNORMAL HIGH (ref 70–99)
Potassium: 4 mmol/L (ref 3.5–5.1)
Sodium: 138 mmol/L (ref 135–145)

## 2022-05-17 LAB — CBC
HCT: 21.7 % — ABNORMAL LOW (ref 36.0–46.0)
Hemoglobin: 7.5 g/dL — ABNORMAL LOW (ref 12.0–15.0)
MCH: 31.9 pg (ref 26.0–34.0)
MCHC: 34.6 g/dL (ref 30.0–36.0)
MCV: 92.3 fL (ref 80.0–100.0)
Platelets: 132 10*3/uL — ABNORMAL LOW (ref 150–400)
RBC: 2.35 MIL/uL — ABNORMAL LOW (ref 3.87–5.11)
RDW: 13.7 % (ref 11.5–15.5)
WBC: 8.7 10*3/uL (ref 4.0–10.5)
nRBC: 0 % (ref 0.0–0.2)

## 2022-05-17 NOTE — TOC Initial Note (Signed)
Transition of Care [facility]) - Initial/Assessment Note    Patient Details  Name: Carrie Barnes MRN: [id number] Date of Birth: 26-Apr-1936  [facility] CM/SW Contact:    Emeterio Reeve, LCSW Phone Number: 05/17/2022, 3:57 PM  Clinical Narrative:                  CSW received SNF consult. CSW met with pt at bedside. CSW introduced self and explained role at the hospital. Pt reports that PTA she was independent at home.  CSW reviewed PT/OT recommendations for SNF. Pt reports she is fine with SNF. Pt gave CSW permission to fax out to facilities in the area. Pt has no preference of facility at this time. CSW gave pt medicare.gov rating list to review. CSW explained insurance auth process.  CSW will continue to follow.  Expected Discharge Plan: Skilled Nursing Facility Barriers to Discharge: Insurance Authorization, Continued Medical Work up, SNF Pending bed offer   Patient Goals and CMS Choice Patient states their goals for this hospitalization and ongoing recovery are:: SNF CMS Medicare.gov Compare Post Acute Care list provided to:: Patient Choice offered to / list presented to : Patient  Expected Discharge Plan and Services Expected Discharge Plan: [place] arrangements for the past 2 months: Single Family Home                                      Prior Living Arrangements/Services Living arrangements for the past 2 months: Single Family Home Lives with:: Self Patient language and need for interpreter reviewed:: Yes Do you feel safe going back to the place where you live?: Yes      Need for Family Participation in Patient Care: Yes (Comment) Care giver support system in place?: Yes (comment)   Criminal Activity/Legal Involvement Pertinent to Current Situation/Hospitalization: No - Comment as needed  Activities of Daily Living Home Assistive Devices/Equipment: Dentures (specify type), Eyeglasses (full set dentures) ADL  Screening (condition at time of admission) Patient's cognitive ability adequate to safely complete daily activities?: Yes Is the patient deaf or have difficulty hearing?: No (tinnitis in both ears) Does the patient have difficulty seeing, even when wearing glasses/contacts?: No Does the patient have difficulty concentrating, remembering, or making decisions?: No Patient able to express need for assistance with ADLs?: Yes Does the patient have difficulty dressing or bathing?: Yes Independently performs ADLs?: No Communication: Independent Dressing (OT): Needs assistance Is this a change from baseline?: Change from baseline, expected to last >3 days Grooming: Needs assistance Is this a change from baseline?: Change from baseline, expected to last >3 days Feeding: Needs assistance Is this a change from baseline?: Change from baseline, expected to last >3 days Bathing: Needs assistance Is this a change from baseline?: Change from baseline, expected to last >3 days Toileting: Needs assistance Is this a change from baseline?: Change from baseline, expected to last >3days In/Out Bed: Needs assistance Is this a change from baseline?: Change from baseline, expected to last >3 days Walks in Home: Dependent Is this a change from baseline?: Change from baseline, expected to last >3 days Does the patient have difficulty walking or climbing stairs?: Yes Weakness of Legs: Right Weakness of Arms/Hands: Right  Permission Sought/Granted Permission sought to share information with : Case Manager, Customer service manager Permission granted to share information with : Yes, Verbal Permission Granted  Permission granted to share info w AGENCY: SNF        Emotional Assessment Appearance:: Appears stated age Attitude/Demeanor/Rapport: Engaged Affect (typically observed): Appropriate Orientation: : Oriented to Self, Oriented to Place, Oriented to  Time, Oriented to Situation Alcohol /  Substance Use: Not Applicable Psych Involvement: No (comment)  Admission diagnosis:  Pelvis fracture ([place]) [S32.9XXA] Fall, initial encounter [id number]] Closed fracture of proximal end of right humerus, unspecified fracture morphology, initial encounter [S42.201A] Closed displaced fracture of right acetabulum, unspecified portion of acetabulum, initial encounter [facility]) [S32.401A] Patient Active Problem List   Diagnosis Date Noted   Hypomagnesemia 05/13/2022   Pelvis fracture ([place]) 05/12/2022   Humeral head fracture, right, closed, initial encounter 05/12/2022   Elevated troponin 05/12/2022   Hematoma 05/12/2022   Hypertension 11/05/2021   Complete rotator cuff tear 09/20/2021   Closed intertrochanteric fracture of right hip ([place]) 07/11/2019   Closed right hip fracture ([place]) 07/11/2019   Chronic cholecystitis due to cholelithiasis with choledocholithiasis 11/05/2017   Cholelithiasis with chronic cholecystitis 11/01/2017   Cholelithiasis 05/18/2017   CAD in native artery 03/31/2017   Ischemic cardiomyopathy 03/31/2017   Hyperlipemia 03/31/2017   DJD (degenerative joint disease) 03/31/2017   Osteoporosis 03/31/2017   PCP:  Seward Carol, MD Pharmacy:   [facility] [place], [address] AT [address] [place] [address] Phone: [phone] Fax: [phone]  [email or web address] [facility] SERV[facility]) [place], [place] [address] Phone: [phone] Fax: [phone]     Social Determinants of Health (SDOH) Interventions    Readmission Risk Interventions     No data to display

## 2022-05-17 NOTE — Progress Notes (Signed)
Carrie Barnes  YQI:[id number] DOB: 1935-08-28 DOA: 05/12/2022 PCP: Renford Dills, MD    Brief Narrative:  86 year old with a history of CAD status post stenting in 2018, HLD, and HTN who suffered a mechanical fall in a parking lot after which she complained of severe pain in her hip.  In the ER x-rays revealed a comminuted fracture of the right humeral neck as well as an acute comminuted fracture of the right acetabulum with extension into the iliac crest.  Plan for the OR on 10/4 for hip and again on 10/5 for shoulder.  Consultants:  Orthopedics  Cardiology   Goals of Care:  Code Status: Full Code   DVT prophylaxis: SCDs until post-op  Interim Hx: No SOB, no CP  Assessment & Plan:  Acute fractures of the right acetabulum and right humerus status post mechanical fall Care per orthopedic surgery -s/p OR on 10/4 and again 10/5 PT/OT SNF -TOC consulted x2  Coronary artery disease status post DES 2018 Clinically stable - no hx suggestive of USAP or CHF - was seen by Cards at request of Ortho, and Cards confirmed there is no need for further pre-operative evaluation   Hypomagnesemia Corrected with supplementation  Suboptimal B12 B12 320 - supplement with goal of 400+  HTN Blood pressure well controlled at present  HLD Continue usual dose of Lipitor  Chronic normocytic anemia -daily labs -given 2 units PRBC on 10/4   Family Communication: no family present at time of exam  Disposition:  SNF   Objective: Blood pressure (!) 106/46, pulse 86, temperature 98.5 F (36.9 C), temperature source Oral, resp. rate 16, height 5\' 2"  (1.575 m), weight 70 kg, SpO2 97 %.  Intake/Output Summary (Last 24 hours) at 05/17/2022 1058 Last data filed at 05/17/2022 07/17/2022 Gross per 24 hour  Intake --  Output 1600 ml  Net -1600 ml   Filed Weights   05/12/22 1627  Weight: 70 kg    Examination:   General: Appearance:     Overweight female in no acute distress     Lungs:     On  [place], respirations unlabored  Heart:    Normal heart rate.  MS:   All extremities are intact.   Neurologic:   Awake, alert       CBC: Recent Labs  Lab 05/12/22 2055 05/14/22 0301 05/15/22 0247 05/16/22 0220 05/17/22 0203  WBC 16.2*   < > 11.0* 8.7 8.7  NEUTROABS 13.7*  --   --   --   --   HGB 10.4*   < > 10.4* 7.4* 7.5*  HCT 30.1*   < > 29.4* 21.4* 21.7*  MCV 88.5   < > 89.1 91.1 92.3  PLT 159   < > 108* 107* 132*   < > = values in this interval not displayed.   Basic Metabolic Panel: Recent Labs  Lab 05/12/22 2055 05/13/22 0145 05/13/22 07/13/22 05/14/22 0301 05/14/22 1454 05/15/22 0247 05/16/22 0220 05/17/22 0203  NA 139  --   --  133*   < > 134* 138 138  K 3.6  --   --  4.0   < > 4.4 4.3 4.0  CL 103  --   --  101  --  102 104 103  CO2 27  --   --  24  --  26 28 31   GLUCOSE 145*  --   --  131*  --  152* 136* 131*  BUN 12  --   --  9  --  10 10 11   CREATININE 0.88  --   --  0.75  --  0.80 0.82 0.71  CALCIUM 9.1  --   --  8.4*  --  8.0* 7.8* 8.2*  MG 1.6* 1.9 1.7 1.8  --   --   --   --   PHOS 3.7  --   --   --   --   --   --   --    < > = values in this interval not displayed.   GFR: Estimated Creatinine Clearance: 48 mL/min (by C-G formula based on SCr of 0.71 mg/dL).   Scheduled Meds:  acetaminophen  1,000 mg Oral Q6H   atorvastatin  80 mg Oral Daily   Chlorhexidine Gluconate Cloth  6 each Topical Daily   docusate sodium  100 mg Oral BID   feeding supplement  237 mL Oral BID BM   latanoprost  1 drop Both Eyes QHS   metoprolol succinate  12.5 mg Oral Daily   multivitamin with minerals  1 tablet Oral Daily   pantoprazole  40 mg Oral Daily   senna  1 tablet Oral BID   timolol  1 drop Left Eye BID   Urelle  1 tablet Oral QID   Continuous Infusions:  methocarbamol (ROBAXIN) IV       LOS: 5 days   Eulogio Bear DO  If 7PM-7AM, please contact night-coverage per Amion 05/17/2022, 10:58 AM

## 2022-05-17 NOTE — NC FL2 (Signed)
[place] LEVEL OF CARE SCREENING TOOL     IDENTIFICATION  Patient Name: Carrie Barnes Birthdate: [birth year] Sex: female Admission Date (Current Location): 05/12/2022  [facility] and [place] Number:  Herbalist and Address:  [facility], [place] [address], [place], [place] [address]      Provider Number: [id number]  Attending Physician Name and Address:  Geradine Girt, DO  Relative Name and Phone Number:       Current Level of Care: Hospital Recommended Level of Care: [place] Prior Approval Number:    Date Approved/Denied:   PASRR Number: [id number]  Discharge Plan: SNF    Current Diagnoses: Patient Active Problem List   Diagnosis Date Noted   Hypomagnesemia 05/13/2022   Pelvis fracture ([place]) 05/12/2022   Humeral head fracture, right, closed, initial encounter 05/12/2022   Elevated troponin 05/12/2022   Hematoma 05/12/2022   Hypertension 11/05/2021   Complete rotator cuff tear 09/20/2021   Closed intertrochanteric fracture of right hip ([place]) 07/11/2019   Closed right hip fracture ([place]) 07/11/2019   Chronic cholecystitis due to cholelithiasis with choledocholithiasis 11/05/2017   Cholelithiasis with chronic cholecystitis 11/01/2017   Cholelithiasis 05/18/2017   CAD in native artery 03/31/2017   Ischemic cardiomyopathy 03/31/2017   Hyperlipemia 03/31/2017   DJD (degenerative joint disease) 03/31/2017   Osteoporosis 03/31/2017    Orientation RESPIRATION BLADDER Height & Weight     Self, Time, Situation, Place  Normal Incontinent Weight: 154 lb 5.2 oz (70 kg) Height:  5\' 2"  (157.5 cm)  BEHAVIORAL SYMPTOMS/MOOD NEUROLOGICAL BOWEL NUTRITION STATUS      Incontinent Diet (See DC summary)  AMBULATORY STATUS COMMUNICATION OF NEEDS Skin   Extensive Assist Verbally Normal                       Personal Care Assistance Level of Assistance  Bathing, Feeding, Dressing Bathing Assistance: Maximum  assistance Feeding assistance: Limited assistance Dressing Assistance: Maximum assistance     Functional Limitations Info  Sight, Hearing, Speech Sight Info: Adequate Hearing Info: Adequate Speech Info: Adequate    SPECIAL CARE FACTORS FREQUENCY  OT (By licensed OT), PT (By licensed PT)     PT Frequency: 5x a week OT Frequency: 5x a week            Contractures Contractures Info: Not present    Additional Factors Info  Code Status, Allergies Code Status Info: Full Allergies Info: NKA           Current Medications (05/17/2022):  This is the current hospital active medication list Current Facility-Administered Medications  Medication Dose Route Frequency Provider Last Rate Last Admin   acetaminophen (TYLENOL) tablet 1,000 mg  1,000 mg Oral Q6H Nicholes Stairs, MD   1,000 mg at 05/17/22 1033   atorvastatin (LIPITOR) tablet 80 mg  80 mg Oral Daily Nicholes Stairs, MD   80 mg at 05/17/22 1033   Chlorhexidine Gluconate Cloth 2 % PADS 6 each  6 each Topical Daily Nicholes Stairs, MD   6 each at 05/17/22 1340   docusate sodium (COLACE) capsule 100 mg  100 mg Oral BID Nicholes Stairs, MD   100 mg at 05/17/22 1033   feeding supplement (ENSURE ENLIVE / ENSURE PLUS) liquid 237 mL  237 mL Oral BID BM Nicholes Stairs, MD   237 mL at 05/17/22 1034   latanoprost (XALATAN) 0.005 % ophthalmic solution 1 drop  1 drop Both  Eyes QHS Yolonda Kida, MD   1 drop at 05/16/22 2304   menthol-cetylpyridinium (CEPACOL) lozenge 3 mg  1 lozenge Oral PRN Yolonda Kida, MD       Or   phenol [facility]) mouth spray 1 spray  1 spray Mouth/Throat PRN Yolonda Kida, MD       methocarbamol (ROBAXIN) tablet 500 mg  500 mg Oral Q6H PRN Yolonda Kida, MD   500 mg at 05/17/22 8182   Or   methocarbamol (ROBAXIN) 500 mg in dextrose 5 % 50 mL IVPB  500 mg Intravenous Q6H PRN Yolonda Kida, MD       metoCLOPramide (REGLAN) tablet 5-10 mg  5-10  mg Oral Q8H PRN Yolonda Kida, MD       Or   metoCLOPramide (REGLAN) injection 5-10 mg  5-10 mg Intravenous Q8H PRN Yolonda Kida, MD       metoprolol succinate (TOPROL-XL) 24 hr tablet 12.5 mg  12.5 mg Oral Daily Marlin Canary U, DO   12.5 mg at 05/17/22 1032   morphine (PF) 2 MG/ML injection 1-2 mg  1-2 mg Intravenous Q2H PRN Yolonda Kida, MD   2 mg at 05/16/22 1447   multivitamin with minerals tablet 1 tablet  1 tablet Oral Daily Yolonda Kida, MD   1 tablet at 05/17/22 1033   ondansetron (ZOFRAN) tablet 4 mg  4 mg Oral Q6H PRN Yolonda Kida, MD       Or   ondansetron [facility]) injection 4 mg  4 mg Intravenous Q6H PRN Yolonda Kida, MD       oxyCODONE (Oxy IR/ROXICODONE) immediate release tablet 5-10 mg  5-10 mg Oral Q4H PRN Yolonda Kida, MD   5 mg at 05/17/22 1504   pantoprazole (PROTONIX) EC tablet 40 mg  40 mg Oral Daily Yolonda Kida, MD   40 mg at 05/17/22 1033   polyethylene glycol (MIRALAX / GLYCOLAX) packet 17 g  17 g Oral Daily PRN Yolonda Kida, MD   17 g at 05/16/22 1028   senna (SENOKOT) tablet 8.6 mg  1 tablet Oral BID Yolonda Kida, MD   8.6 mg at 05/17/22 1033   timolol (TIMOPTIC) 0.5 % ophthalmic solution 1 drop  1 drop Left Eye BID Yolonda Kida, MD   1 drop at 05/17/22 1031   traZODone (DESYREL) tablet 50 mg  50 mg Oral QHS PRN Yolonda Kida, MD       Urelle (URELLE/URISED) 81 MG tablet 81 mg  1 tablet Oral QID Yolonda Kida, MD   81 mg at 05/17/22 1346   Facility-Administered Medications Ordered in Other Encounters  Medication Dose Route Frequency Provider Last Rate Last Admin   indomethacin (INDOCIN) 50 MG suppository 50 mg  50 mg Rectal Once Willis Modena, MD         Discharge Medications: Please see discharge summary for a list of discharge medications.  Relevant Imaging Results:  Relevant Lab Results:   Additional Information SSN [id number]  Jimmy Picket, [place]

## 2022-05-18 DIAGNOSIS — S42201A Unspecified fracture of upper end of right humerus, initial encounter for closed fracture: Secondary | ICD-10-CM | POA: Diagnosis not present

## 2022-05-18 DIAGNOSIS — S32401A Unspecified fracture of right acetabulum, initial encounter for closed fracture: Secondary | ICD-10-CM | POA: Diagnosis not present

## 2022-05-18 DIAGNOSIS — W19XXXA Unspecified fall, initial encounter: Secondary | ICD-10-CM | POA: Diagnosis not present

## 2022-05-18 LAB — CBC
HCT: 22.4 % — ABNORMAL LOW (ref 36.0–46.0)
Hemoglobin: 7.4 g/dL — ABNORMAL LOW (ref 12.0–15.0)
MCH: 31 pg (ref 26.0–34.0)
MCHC: 33 g/dL (ref 30.0–36.0)
MCV: 93.7 fL (ref 80.0–100.0)
Platelets: 159 10*3/uL (ref 150–400)
RBC: 2.39 MIL/uL — ABNORMAL LOW (ref 3.87–5.11)
RDW: 13.5 % (ref 11.5–15.5)
WBC: 7.2 10*3/uL (ref 4.0–10.5)
nRBC: 0 % (ref 0.0–0.2)

## 2022-05-18 NOTE — Plan of Care (Signed)

## 2022-05-18 NOTE — Progress Notes (Addendum)
Subjective: 3 Days Post-Op Procedure(s) (LRB): RIGHT REVERSE SHOULDER ARTHROPLASTY (Right) Patient reports pain as moderate.    Objective: Vital signs in last 24 hours: Temp:  [98.4 F (36.9 C)-98.9 F (37.2 C)] 98.9 F (37.2 C) (10/08 0728) Pulse Rate:  [83-96] 86 (10/08 0728) Resp:  [16-17] 16 (10/08 0728) BP: (108-117)/(45-54) 109/46 (10/08 0728) SpO2:  [92 %-99 %] 92 % (10/08 0728)  Intake/Output from previous day: 10/07 0701 - 10/08 0700 In: -  Out: 2800 [Urine:2800] Intake/Output this shift: No intake/output data recorded.  Recent Labs    05/16/22 0220 05/17/22 0203 05/18/22 0239  HGB 7.4* 7.5* 7.4*   Recent Labs    05/17/22 0203 05/18/22 0239  WBC 8.7 7.2  RBC 2.35* 2.39*  HCT 21.7* 22.4*  PLT 132* 159   Recent Labs    05/16/22 0220 05/17/22 0203  NA 138 138  K 4.3 4.0  CL 104 103  CO2 28 31  BUN 10 11  CREATININE 0.82 0.71  GLUCOSE 136* 131*  CALCIUM 7.8* 8.2*   No results for input(s): "LABPT", "INR" in the last 72 hours.  Neurologically intact ABD soft Neurovascular intact Sensation intact distally Intact pulses distally Dorsiflexion/Plantar flexion intact Incision: dressing C/D/I and no drainage No cellulitis present Compartment soft No sign of DVT   Assessment/Plan: 3 Days Post-Op Procedure(s) (LRB): RIGHT REVERSE SHOULDER ARTHROPLASTY (Right) Advance diet Up with therapy D/C IV fluids NWB RUE in sling except when using walker Lovenox per trauma team for DVT ppx   Cecilie Kicks 05/18/2022, 9:21 AM

## 2022-05-18 NOTE — Progress Notes (Signed)
Carrie Barnes  LKG:[id number] DOB: [birth year] DOA: 05/12/2022 PCP: Renford Dills, MD    Brief Narrative:  86 year old with a history of CAD status post stenting in 2018, HLD, and HTN who suffered a mechanical fall in a parking lot after which she complained of severe pain in her hip.  In the ER x-rays revealed a comminuted fracture of the right humeral neck as well as an acute comminuted fracture of the right acetabulum with extension into the iliac crest.  Plan for the OR on 10/4 for hip and again on 10/5 for shoulder.  Consultants:  Orthopedics  Cardiology   Goals of Care:  Code Status: Full Code   DVT prophylaxis: SCDs until post-op  Interim Hx: C/o soreness in med back  Assessment & Plan:  Acute fractures of the right acetabulum and right humerus status post mechanical fall Care per orthopedic surgery -s/p OR on 10/4 and again 10/5 PT/OT SNF -TOC consulted x2  Coronary artery disease status post DES 2018 Clinically stable - no hx suggestive of USAP or CHF - was seen by Cards at request of Ortho, and Cards confirmed there is no need for further pre-operative evaluation   Hypomagnesemia Corrected with supplementation  Suboptimal B12 B12 320 - supplement with goal of 400+  HTN Blood pressure well controlled at present  HLD Continue usual dose of Lipitor  Chronic normocytic anemia -daily labs -given 2 units PRBC on 10/4   Family Communication: no family present at time of exam  Disposition:  SNF   Objective: Blood pressure (!) 109/46, pulse 86, temperature 98.9 F (37.2 C), temperature source Oral, resp. rate 16, height 5\' 2"  (1.575 m), weight 70 kg, SpO2 92 %.  Intake/Output Summary (Last 24 hours) at 05/18/2022 1221 Last data filed at 05/18/2022 1020 Gross per 24 hour  Intake --  Output 2300 ml  Net -2300 ml   Filed Weights   05/12/22 1627  Weight: 70 kg    Examination:    General: Appearance:     Overweight female in no acute distress      Lungs:     respirations unlabored  Heart:    Normal heart rate.  MS:   All extremities are intact.   Neurologic:   Awake, alert       CBC: Recent Labs  Lab 05/12/22 2055 05/14/22 0301 05/16/22 0220 05/17/22 0203 05/18/22 0239  WBC 16.2*   < > 8.7 8.7 7.2  NEUTROABS 13.7*  --   --   --   --   HGB 10.4*   < > 7.4* 7.5* 7.4*  HCT 30.1*   < > 21.4* 21.7* 22.4*  MCV 88.5   < > 91.1 92.3 93.7  PLT 159   < > 107* 132* 159   < > = values in this interval not displayed.   Basic Metabolic Panel: Recent Labs  Lab 05/12/22 2055 05/13/22 0145 05/13/22 07/13/22 05/14/22 0301 05/14/22 1454 05/15/22 0247 05/16/22 0220 05/17/22 0203  NA 139  --   --  133*   < > 134* 138 138  K 3.6  --   --  4.0   < > 4.4 4.3 4.0  CL 103  --   --  101  --  102 104 103  CO2 27  --   --  24  --  26 28 31   GLUCOSE 145*  --   --  131*  --  152* 136* 131*  BUN 12  --   --  9  --  10 10 11   CREATININE 0.88  --   --  0.75  --  0.80 0.82 0.71  CALCIUM 9.1  --   --  8.4*  --  8.0* 7.8* 8.2*  MG 1.6* 1.9 1.7 1.8  --   --   --   --   PHOS 3.7  --   --   --   --   --   --   --    < > = values in this interval not displayed.   GFR: Estimated Creatinine Clearance: 48 mL/min (by C-G formula based on SCr of 0.71 mg/dL).   Scheduled Meds:  acetaminophen  1,000 mg Oral Q6H   atorvastatin  80 mg Oral Daily   Chlorhexidine Gluconate Cloth  6 each Topical Daily   docusate sodium  100 mg Oral BID   feeding supplement  237 mL Oral BID BM   latanoprost  1 drop Both Eyes QHS   metoprolol succinate  12.5 mg Oral Daily   multivitamin with minerals  1 tablet Oral Daily   pantoprazole  40 mg Oral Daily   senna  1 tablet Oral BID   timolol  1 drop Left Eye BID   Urelle  1 tablet Oral QID   Continuous Infusions:  methocarbamol (ROBAXIN) IV       LOS: 6 days   Eulogio Bear DO  If 7PM-7AM, please contact night-coverage per Amion 05/18/2022, 12:21 PM

## 2022-05-19 DIAGNOSIS — S32401A Unspecified fracture of right acetabulum, initial encounter for closed fracture: Secondary | ICD-10-CM | POA: Diagnosis not present

## 2022-05-19 DIAGNOSIS — S42201A Unspecified fracture of upper end of right humerus, initial encounter for closed fracture: Secondary | ICD-10-CM | POA: Diagnosis not present

## 2022-05-19 LAB — CBC
HCT: 22.6 % — ABNORMAL LOW (ref 36.0–46.0)
Hemoglobin: 7.4 g/dL — ABNORMAL LOW (ref 12.0–15.0)
MCH: 30.7 pg (ref 26.0–34.0)
MCHC: 32.7 g/dL (ref 30.0–36.0)
MCV: 93.8 fL (ref 80.0–100.0)
Platelets: 207 10*3/uL (ref 150–400)
RBC: 2.41 MIL/uL — ABNORMAL LOW (ref 3.87–5.11)
RDW: 13.2 % (ref 11.5–15.5)
WBC: 8 10*3/uL (ref 4.0–10.5)
nRBC: 0 % (ref 0.0–0.2)

## 2022-05-19 MED ORDER — METOPROLOL SUCCINATE ER 25 MG PO TB24: 12.5000 mg | ORAL_TABLET | Freq: Every day | ORAL | Status: DC

## 2022-05-19 MED ORDER — OXYCODONE HCL 5 MG PO TABS: 5.0000 mg | ORAL_TABLET | ORAL | 0 refills | Status: DC | PRN

## 2022-05-19 MED ORDER — BISACODYL 10 MG RE SUPP
10.0000 mg | Freq: Every day | RECTAL | Status: DC | PRN
  Administered 2022-05-19: 10 mg via RECTAL
  Filled 2022-05-19: qty 1

## 2022-05-19 MED ORDER — ENSURE ENLIVE PO LIQD: 237.0000 mL | Freq: Two times a day (BID) | ORAL | 12 refills | Status: DC

## 2022-05-19 MED ORDER — SENNA 8.6 MG PO TABS: 1.0000 | ORAL_TABLET | Freq: Two times a day (BID) | ORAL | 0 refills | Status: DC

## 2022-05-19 MED ORDER — BISACODYL 10 MG RE SUPP: 10.0000 mg | Freq: Every day | RECTAL | 0 refills | Status: DC | PRN

## 2022-05-19 MED ORDER — POLYETHYLENE GLYCOL 3350 17 G PO PACK: 17.0000 g | PACK | Freq: Every day | ORAL | 0 refills | Status: AC

## 2022-05-19 MED ORDER — POLYETHYLENE GLYCOL 3350 17 G PO PACK
17.0000 g | PACK | Freq: Every day | ORAL | Status: DC
  Administered 2022-05-19: 17 g via ORAL
  Filled 2022-05-19: qty 1

## 2022-05-19 MED ORDER — APIXABAN 2.5 MG PO TABS: 2.5000 mg | ORAL_TABLET | Freq: Two times a day (BID) | ORAL | 0 refills | Status: DC

## 2022-05-19 NOTE — TOC Progression Note (Signed)
Transition of Care [facility]) - Progression Note    Patient Details  Name: Carrie Barnes MRN: [id number] Date of Birth: 12/10/1935  Transition of Care [facility]) CM/SW [place], [place] Phone Number: 05/19/2022, 1:14 PM  Clinical Narrative:    CSW provided bed offers to pt daughter Carrie Barnes, she will review and contact CSW back with a decision.    Expected Discharge Plan: Skilled Nursing Facility Barriers to Discharge: Ship broker, Continued Medical Work up, SNF Pending bed offer  Expected Discharge Plan and Services Expected Discharge Plan: [place] arrangements for the past 2 months: Single Family Home Expected Discharge Date: 05/19/22                                     Social Determinants of Health (SDOH) Interventions    Readmission Risk Interventions     No data to display

## 2022-05-19 NOTE — Progress Notes (Signed)
Patient's constipation has resolved. She has had several bowel movements this evening.  Patient needed an in and out cath this evening after complaining that she felt like she could not pee and a decrease in urine output throughout the day. Bladder scan showed >662ml, in and out cath resulted in 1424ml out.

## 2022-05-19 NOTE — Progress Notes (Addendum)
Physical Therapy Treatment Patient Details Name: Carrie Barnes MRN: [id number] DOB: Feb 28, 1936 Today's Date: 05/19/2022   History of Present Illness 86 yr old female who presented 05/12/22 due to a fall in a parking lot. Pt found to have had an Acute comminuted fracture of the right acetabulum with extension into the iliac crest and acute fx of the R humeral head and neck.Pt s/p on 05/14/22 ORIF of R acetabular fx and ORIF of R superior pubic ramus fx. Awaiting R reverse TSA. PMH: CAD last cardiac stenting in 2018, GERD, HLD, HTN    PT Comments    Pt progressing slowly towards her physical therapy goals. Focus on bed mobility for peri care and transfer training. Pt requiring up to two person maximal assist for functional mobility and is unable to execute upright standing with platform RW while maintaining RLE weightbearing precautions. May would benefit from trialing slide board transfer. Continue to recommend SNF for ongoing Physical Therapy.      Recommendations for follow up therapy are one component of a multi-disciplinary discharge planning process, led by the attending physician.  Recommendations may be updated based on patient status, additional functional criteria and insurance authorization.  Follow Up Recommendations  Skilled nursing-short term rehab (<3 hours/day) Can patient physically be transported by private vehicle: No   Assistance Recommended at Discharge Frequent or constant Supervision/Assistance  Patient can return home with the following Two people to help with walking and/or transfers;A lot of help with bathing/dressing/bathroom;Assistance with cooking/housework;Assist for transportation;Help with stairs or ramp for entrance   Equipment Recommendations  Other (comment) (defer)    Recommendations for Other Services       Precautions / Restrictions Precautions Precautions: Fall Required Braces or Orthoses: Sling Restrictions Weight Bearing Restrictions: Yes RUE  Weight Bearing: Non weight bearing RLE Weight Bearing: Touchdown weight bearing Other Position/Activity Restrictions: Per Dr. Sharon Seller, able to use PFRW with R UE only     Mobility  Bed Mobility Overal bed mobility: Needs Assistance Bed Mobility: Sit to Supine, Rolling, Sidelying to Sit Rolling: Mod assist Sidelying to sit: Max assist, +2 for physical assistance   Sit to supine: Max assist, +2 for physical assistance   General bed mobility comments: Pt rolling towards left for peri care with modA and + time. Assisted from sidelying position to sitting with assist for BLE's and trunk    Transfers Overall transfer level: Needs assistance Equipment used: Right platform walker Transfers: Sit to/from Stand Sit to Stand: Max assist, +2 physical assistance, From elevated surface           General transfer comment: Pt requiring maxA + 2 to rise to partial stand from edge of bed with use of bed pad to boost hips. Then performing lateral scoot towards left up in bed. Returned to supine and placed on bed pan as pt reporting need for another BM. RN/NT notified.    Ambulation/Gait                   Stairs             Wheelchair Mobility    Modified Rankin (Stroke Patients Only)       Balance Overall balance assessment: Needs assistance Sitting-balance support: Feet supported, Single extremity supported Sitting balance-Leahy Scale: Fair                                      Cognition Arousal/Alertness:  Awake/alert Behavior During Therapy: WFL for tasks assessed/performed Overall Cognitive Status: Within Functional Limits for tasks assessed                                 General Comments: WFL for basic mobility        Exercises      General Comments        Pertinent Vitals/Pain Pain Assessment Pain Assessment: Faces Faces Pain Scale: Hurts even more Pain Location: R hip, buttocks, R shoulder Pain Descriptors /  Indicators: Sore Pain Intervention(s): Limited activity within patient's tolerance, Monitored during session    Home Living                          Prior Function            PT Goals (current goals can now be found in the care plan section) Acute Rehab PT Goals Patient Stated Goal: to get stronger Potential to Achieve Goals: Good    Frequency    Min 3X/week      PT Plan Current plan remains appropriate    Co-evaluation              AM-PAC PT "6 Clicks" Mobility   Outcome Measure  Help needed turning from your back to your side while in a flat bed without using bedrails?: A Lot Help needed moving from lying on your back to sitting on the side of a flat bed without using bedrails?: Total Help needed moving to and from a bed to a chair (including a wheelchair)?: Total Help needed standing up from a chair using your arms (e.g., wheelchair or bedside chair)?: Total Help needed to walk in hospital room?: Total Help needed climbing 3-5 steps with a railing? : Total 6 Click Score: 7    End of Session Equipment Utilized During Treatment: Gait belt;Oxygen Activity Tolerance: Patient tolerated treatment well Patient left: in bed;with call bell/phone within reach;with family/visitor present Nurse Communication: Mobility status;Other (comment) (pt on bed pan) PT Visit Diagnosis: Muscle weakness (generalized) (M62.81);Unsteadiness on feet (R26.81);Other abnormalities of gait and mobility (R26.89)     Time: 2671-2458 PT Time Calculation (min) (ACUTE ONLY): 20 min  Charges:  $Therapeutic Activity: 8-22 mins                     Wyona Almas, PT, DPT Acute Rehabilitation Services Office [phone]    Deno Etienne 05/19/2022, 5:08 PM

## 2022-05-19 NOTE — Discharge Summary (Addendum)
Physician Discharge Summary  Carrie Barnes AOZ:[id number] DOB: 09/11/1935 DOA: 05/12/2022  PCP: Renford Dills, MD  Admit date: 05/12/2022 Discharge date: 05/19/2022  Admitted From: home Discharge disposition: SNF   Recommendations for Outpatient Follow-Up:   2 weeks with Dr. Jena Gauss for repeat x-rays and wound check R hip, 2 weeks with Dr. Aundria Rud for R shoulder  Switch to Eliquis 2.5 mg BID x 30 days for DVT prophylaxis NWB RUE in sling except when using walker  Cbc 1 week Wean O2 to RA-- continue incentive spirometry   Discharge Diagnosis:   Principal Problem:   Pelvis fracture (HCC) Active Problems:   CAD in native artery   Ischemic cardiomyopathy   Hyperlipemia   Osteoporosis   Hypertension   Humeral head fracture, right, closed, initial encounter   Elevated troponin   Hematoma   Hypomagnesemia    Discharge Condition: Improved.  Diet recommendation:   Regular.  Wound care: None.  Code status: Full.   History of Present Illness:   Carrie Barnes is a 86 y.o. female with medical history significant of CAD last cardiac stenting in 2018, GERD, HLD, HTN     Presented with    fall and right hip pain  Had a mechanical fall in the parking lot did not hit her head no C not on blood thinners.  Noted to have pain in her right shoulder and right hip. Patient does take baby aspirin Patient has known CAD was admitted to [facility] in August 2018 and had MI with drug-eluting stent placed in LAD initially she did have evidence of CHF but her EF has improved since then and she has been followed by cardiology At baseline walks independently   Hospital Course by Problem:   Acute fractures of the right acetabulum and right humerus status post mechanical fall Care per orthopedic surgery -s/p OR on 10/4 and again 10/5 (RIGHT REVERSE SHOULDER ARTHROPLASTY (Right) ) PT/OT SNF NWB RUE in sling except when using walker    Coronary artery disease  status post DES 2018 Clinically stable - no hx suggestive of USAP or CHF - was seen by Cards at request of Ortho, and Cards confirmed there is no need for further pre-operative evaluation    Hypomagnesemia Corrected with supplementation   Suboptimal B12 B12 320 - supplement with goal of 400+   HTN Blood pressure well controlled at present -hold home meds-- may need to be resumed at SNF   HLD Continue usual dose of Lipitor   Chronic normocytic anemia -daily labs -given 2 units PRBC on 10/4 -hgb stable -outpatient follow up     Medical Consultants:   Ortho x 2 Cards  Discharge Exam:   Vitals:   05/19/22 0554 05/19/22 0746  BP: (!) 118/56 119/82  Pulse: 81 85  Resp: 18 16  Temp: 98 F (36.7 C) 98.5 F (36.9 C)  SpO2: 97% 100%   Vitals:   05/18/22 1548 05/18/22 2042 05/19/22 0554 05/19/22 0746  BP: 119/66 129/64 (!) 118/56 119/82  Pulse: 86 84 81 85  Resp: Temp: 99.7 F (37.6 C) 98.9 F (37.2 C) 98 F (36.7 C) 98.5 F (36.9 C)  TempSrc: Oral Oral Oral Oral  SpO2: 98% 99% 97% 100%  Weight:      Height:        General exam: Appears calm and comfortable.    The results of significant diagnostics from this hospitalization (including imaging, microbiology, ancillary  and laboratory) are listed below for reference.     Procedures and Diagnostic Studies:   ECHOCARDIOGRAM COMPLETE  Result Date: 05/13/2022    ECHOCARDIOGRAM REPORT   Patient Name:   Carrie Barnes [facility] Date of Exam: 05/13/2022 Medical Rec #:  [id number]                Height:       62.0 in Accession #:    [id number]               Weight:       154.3 lb Date of Birth:  05-Aug-1936                 BSA:          1.712 m Patient Age:    86 years                 BP:           135/68 mmHg Patient Gender: F                        HR:           96 bpm. Exam Location:  Inpatient Procedure: 2D Echo, Cardiac Doppler, Color Doppler and Intracardiac            Opacification Agent Indications:     Elevated troponin  History:        Patient has prior history of Echocardiogram examinations, most                 recent 07/12/2019. CHF, CAD and Previous Myocardial Infarction;                 Risk Factors:Hypertension and Dyslipidemia.  Sonographer:    Milda Smart Referring Phys: [id number] ANASTASSIA DOUTOVA  Sonographer Comments: Technically difficult study due to poor echo windows. Image acquisition challenging due to patient body habitus and Image acquisition challenging due to respiratory motion. IMPRESSIONS  1. Left ventricular ejection fraction, by estimation, is 65 to 70%. The left ventricle has normal function. The left ventricle has no regional wall motion abnormalities. Left ventricular diastolic parameters are consistent with Grade I diastolic dysfunction (impaired relaxation).  2. Right ventricular systolic function is normal. The right ventricular size is normal.  3. The mitral valve is normal in structure. No evidence of mitral valve regurgitation.  4. The aortic valve is tricuspid. There is mild calcification of the aortic valve. There is mild thickening of the aortic valve. Aortic valve regurgitation is not visualized. Aortic valve sclerosis is present, with no evidence of aortic valve stenosis. Comparison(s): No significant change from prior study. Prior images reviewed side by side. FINDINGS  Left Ventricle: Left ventricular ejection fraction, by estimation, is 65 to 70%. The left ventricle has normal function. The left ventricle has no regional wall motion abnormalities. Definity contrast agent was given IV to delineate the left ventricular  endocardial borders. The left ventricular internal cavity size was normal in size. There is borderline concentric left ventricular hypertrophy. Left ventricular diastolic parameters are consistent with Grade I diastolic dysfunction (impaired relaxation). Indeterminate filling pressures. Right Ventricle: The right ventricular size is normal. No increase in  right ventricular wall thickness. Right ventricular systolic function is normal. Left Atrium: Left atrial size was normal in size. Right Atrium: Right atrial size was normal in size. Pericardium: There is no evidence of pericardial effusion. Mitral Valve: The mitral valve is normal in structure. Mild mitral annular  calcification. No evidence of mitral valve regurgitation. Tricuspid Valve: The tricuspid valve is normal in structure. Tricuspid valve regurgitation is not demonstrated. Aortic Valve: The aortic valve is tricuspid. There is mild calcification of the aortic valve. There is mild thickening of the aortic valve. Aortic valve regurgitation is not visualized. Aortic valve sclerosis is present, with no evidence of aortic valve stenosis. Pulmonic Valve: The pulmonic valve was normal in structure. Pulmonic valve regurgitation is not visualized. Aorta: The aortic root and ascending aorta are structurally normal, with no evidence of dilitation. IAS/Shunts: No atrial level shunt detected by color flow Doppler.  LEFT VENTRICLE PLAX 2D LVIDd:         4.40 cm     Diastology LVIDs:         3.70 cm     LV e' medial:    5.55 cm/s LV PW:         1.20 cm     LV E/e' medial:  12.8 LV IVS:        1.10 cm     LV e' lateral:   5.33 cm/s LVOT diam:     1.90 cm     LV E/e' lateral: 13.4 LV SV:         62 LV SV Index:   36 LVOT Area:     2.84 cm  LV Volumes (MOD) LV vol d, MOD A2C: 53.8 ml LV vol d, MOD A4C: 60.1 ml LV vol s, MOD A2C: 27.3 ml LV vol s, MOD A4C: 31.2 ml LV SV MOD A2C:     26.5 ml LV SV MOD A4C:     60.1 ml LV SV MOD BP:      28.3 ml RIGHT VENTRICLE RV S prime:     13.10 cm/s LEFT ATRIUM             Index        RIGHT ATRIUM           Index LA diam:        3.20 cm 1.87 cm/m   RA Area:     13.10 cm LA Vol (A2C):   45.6 ml 26.63 ml/m  RA Volume:   28.20 ml  16.47 ml/m LA Vol (A4C):   28.8 ml 16.82 ml/m LA Biplane Vol: 37.7 ml 22.02 ml/m  AORTIC VALVE LVOT Vmax:   126.00 cm/s LVOT Vmean:  86.800 cm/s LVOT VTI:     0.217 m  AORTA Ao Root diam: 3.20 cm Ao Asc diam:  3.30 cm MITRAL VALVE MV Area (PHT): 3.42 cm    SHUNTS MV Decel Time: 222 msec    Systemic VTI:  0.22 m MV E velocity: 71.30 cm/s  Systemic Diam: 1.90 cm MV A velocity: 93.80 cm/s MV E/A ratio:  0.76 Mihai Croitoru MD Electronically signed by Thurmon Fair MD Signature Date/Time: 05/13/2022/10:00:57 AM    Final    CT Head Wo Contrast  Result Date: 05/12/2022 CLINICAL DATA:  Head trauma, minor (Age >= 65y); Neck trauma (Age >= 65y). Fall, shoulder and pelvic fractures , prev ct head and c-spine 07/11/19 EXAM: CT HEAD WITHOUT CONTRAST CT CERVICAL SPINE WITHOUT CONTRAST TECHNIQUE: Multidetector CT imaging of the head and cervical spine was performed following the standard protocol without intravenous contrast. Multiplanar CT image reconstructions of the cervical spine were also generated. RADIATION DOSE REDUCTION: This exam was performed according to the departmental dose-optimization program which includes automated exposure control, adjustment of the mA and/or kV according to patient size and/or  use of iterative reconstruction technique. COMPARISON:  CT head and C-spine 07/11/2019 FINDINGS: CT HEAD FINDINGS BRAIN: BRAIN Cerebral ventricle sizes are concordant with the degree of cerebral volume loss. No evidence of large-territorial acute infarction. No parenchymal hemorrhage. No mass lesion. No extra-axial collection. No mass effect or midline shift. No hydrocephalus. Basilar cisterns are patent. Vascular: No hyperdense vessel. Skull: No acute fracture or focal lesion. Sinuses/Orbits: Paranasal sinuses and mastoid air cells are clear. Bilateral lens replacement. Otherwise the orbits are unremarkable. Other: None. CT CERVICAL SPINE FINDINGS Alignment: Normal. Skull base and vertebrae: Multilevel moderate severe degenerative changes with osteophyte formation, facet arthropathy, uncovertebral arthropathy. No associated severe osseous neural foraminal or central  canal stenosis. No acute fracture. No aggressive appearing focal osseous lesion or focal pathologic process. Soft tissues and spinal canal: No prevertebral fluid or swelling. No visible canal hematoma. Upper chest: Unremarkable. Other: 1 cm right thyroid gland hypodensity. Not clinically significant; no follow-up imaging recommended (ref: J Am Coll Radiol. 2015 Feb;12(2): 143-50). IMPRESSION: 1. No acute intracranial abnormality. 2. No acute displaced fracture or traumatic listhesis of the cervical spine. Electronically Signed   By: Tish Frederickson M.D.   On: 05/12/2022 23:09   CT Cervical Spine Wo Contrast  Result Date: 05/12/2022 CLINICAL DATA:  Head trauma, minor (Age >= 65y); Neck trauma (Age >= 65y). Fall, shoulder and pelvic fractures , prev ct head and c-spine 07/11/19 EXAM: CT HEAD WITHOUT CONTRAST CT CERVICAL SPINE WITHOUT CONTRAST TECHNIQUE: Multidetector CT imaging of the head and cervical spine was performed following the standard protocol without intravenous contrast. Multiplanar CT image reconstructions of the cervical spine were also generated. RADIATION DOSE REDUCTION: This exam was performed according to the departmental dose-optimization program which includes automated exposure control, adjustment of the mA and/or kV according to patient size and/or use of iterative reconstruction technique. COMPARISON:  CT head and C-spine 07/11/2019 FINDINGS: CT HEAD FINDINGS BRAIN: BRAIN Cerebral ventricle sizes are concordant with the degree of cerebral volume loss. No evidence of large-territorial acute infarction. No parenchymal hemorrhage. No mass lesion. No extra-axial collection. No mass effect or midline shift. No hydrocephalus. Basilar cisterns are patent. Vascular: No hyperdense vessel. Skull: No acute fracture or focal lesion. Sinuses/Orbits: Paranasal sinuses and mastoid air cells are clear. Bilateral lens replacement. Otherwise the orbits are unremarkable. Other: None. CT CERVICAL SPINE FINDINGS  Alignment: Normal. Skull base and vertebrae: Multilevel moderate severe degenerative changes with osteophyte formation, facet arthropathy, uncovertebral arthropathy. No associated severe osseous neural foraminal or central canal stenosis. No acute fracture. No aggressive appearing focal osseous lesion or focal pathologic process. Soft tissues and spinal canal: No prevertebral fluid or swelling. No visible canal hematoma. Upper chest: Unremarkable. Other: 1 cm right thyroid gland hypodensity. Not clinically significant; no follow-up imaging recommended (ref: J Am Coll Radiol. 2015 Feb;12(2): 143-50). IMPRESSION: 1. No acute intracranial abnormality. 2. No acute displaced fracture or traumatic listhesis of the cervical spine. Electronically Signed   By: Tish Frederickson M.D.   On: 05/12/2022 23:09   DG Chest Port 1 View  Result Date: 05/12/2022 CLINICAL DATA:  Proper evaluation, hip and shoulder fracture EXAM: PORTABLE CHEST 1 VIEW COMPARISON:  05/16/2021 FINDINGS: Single frontal view of the chest demonstrates a stable cardiac silhouette. No airspace disease, effusion, or pneumothorax. Comminuted right humeral neck fracture. IMPRESSION: 1. Comminuted right humeral neck fracture. 2. No acute intrathoracic process. Electronically Signed   By: Sharlet Salina M.D.   On: 05/12/2022 22:48   CT Shoulder Right Wo Contrast  Result  Date: 05/12/2022 CLINICAL DATA:  Shoulder trauma, fracture of humerus or scapula EXAM: CT OF THE UPPER RIGHT EXTREMITY WITHOUT CONTRAST TECHNIQUE: Multidetector CT imaging of the upper right extremity was performed according to the standard protocol. RADIATION DOSE REDUCTION: This exam was performed according to the departmental dose-optimization program which includes automated exposure control, adjustment of the mA and/or kV according to patient size and/or use of iterative reconstruction technique. COMPARISON:  Same-day x-ray FINDINGS: Bones/Joint/Cartilage Acute fracture of the right  humeral head and neck with transversely oriented component through the surgical neck. Humeral neck fracture impacted by approximately 2 cm. There is approximately 1.4 cm of anterior displacement. Nondisplaced fracture component involves the greater and lesser tuberosities. There appears to be disruption of the humeral head articular surface inferiorly. Glenohumeral joint alignment maintained without dislocation. Remaining visualized osseous structures are otherwise intact. Normal alignment at the [facility] joint. Ligaments Suboptimally assessed by CT. Muscles and Tendons Enlargement of the anterior deltoid muscle adjacent to the fracture site, likely representing a component of intramuscular hemorrhage. Atrophy of the infraspinatus muscle. Soft tissues Generalized soft tissue swelling about the shoulder. No right axillary lymphadenopathy. Aortic atherosclerosis. Included right lung field is clear. IMPRESSION: 1. Acute fracture of the right humeral head and neck, as described above. 2. Enlargement of the anterior deltoid muscle adjacent to the fracture site, likely representing a component of intramuscular hemorrhage. Aortic Atherosclerosis (ICD10-I70.0). Electronically Signed   By: Davina Poke D.O.   On: 05/12/2022 19:21   CT PELVIS WO CONTRAST  Result Date: 05/12/2022 CLINICAL DATA:  Hip trauma, fracture suspected, xray done EXAM: CT PELVIS WITHOUT CONTRAST TECHNIQUE: Multidetector CT imaging of the pelvis was performed following the standard protocol without intravenous contrast. RADIATION DOSE REDUCTION: This exam was performed according to the departmental dose-optimization program which includes automated exposure control, adjustment of the mA and/or kV according to patient size and/or use of iterative reconstruction technique. COMPARISON:  03/02/2017 FINDINGS: Urinary Tract:  No abnormality visualized. Bowel: Sigmoid diverticulosis. No active inflammation or evidence of bowel obstruction within the pelvis.  Vascular/Lymphatic: Aortoiliac atherosclerosis. Vascular findings are not well assessed on noncontrast imaging. Reproductive:  No mass or other significant abnormality Other:  Small right pelvic sidewall hematoma. Musculoskeletal: Acute comminuted fracture of the right acetabulum with extension into the iliac crest. Acetabular fracture involves both the anterior and posterior columns. Femoroacetabular joint alignment is maintained without dislocation. Comminuted, displaced and angulated fracture of the mid right inferior pubic ramus. Fullness within the right obturator externus and adductor magnus muscles adjacent to the right inferior pubic ramus likely reflecting a component of intramuscular hematoma. No pubic diastasis. Sacroiliac joints intact without diastasis. Prior ORIF of the proximal right femur. No hardware complication or femur fracture. IMPRESSION: 1. Acute comminuted fracture of the right acetabulum with extension into the iliac crest. 2. Small right pelvic sidewall hematoma. 3. Right inferior pubic ramus fracture with adjacent intramuscular hematomas involving the right obturator externus and adductor magnus muscles. 4. Aortic atherosclerosis (ICD10-I70.0). Electronically Signed   By: Davina Poke D.O.   On: 05/12/2022 18:32   DG Hip Unilat W or Wo Pelvis 2-3 Views Right  Result Date: 05/12/2022 CLINICAL DATA:  Trauma, fall EXAM: DG HIP (WITH OR WITHOUT PELVIS) 2-3V RIGHT COMPARISON:  07/11/2019 FINDINGS: There is previous internal fixation of intertrochanteric fracture of right femur with intramedullary rod. There is deformity in right inferior pubic ramus suggesting recent or old fracture. There is offset in alignment in the cortical margin in the right iliac crest suggesting  possible fracture. IMPRESSION: Previous internal fixation of fracture of proximal right femur. Deformities in the right iliac crest and right inferior pubic ramus suggest possible recent fractures. If clinically  warranted, follow-up CT may be considered. Electronically Signed   By: Ernie Avena M.D.   On: 05/12/2022 17:19   DG Shoulder Right  Result Date: 05/12/2022 CLINICAL DATA:  Trauma, fall EXAM: RIGHT SHOULDER - 2+ VIEW COMPARISON:  07/14/2019 FINDINGS: There is severely comminuted fracture in the neck cough right humerus. There is superior displacement of shaft of humerus in relation to the head. In the scapular Y-view, humeral head is projecting slightly more anterior than usual. IMPRESSION: Comminuted fracture is seen in the neck of right humerus. Possible anterior subluxation/dislocation in the right shoulder. Electronically Signed   By: Ernie Avena M.D.   On: 05/12/2022 17:15     Labs:   Basic Metabolic Panel: Recent Labs  Lab 05/12/22 2055 05/13/22 0145 05/13/22 0814 05/14/22 0301 05/14/22 1454 05/15/22 0247 05/16/22 0220 05/17/22 0203  NA 139  --   --  133* 135 134* 138 138  K 3.6  --   --  4.0 4.9 4.4 4.3 4.0  CL 103  --   --  101  --  102 104 103  CO2 27  --   --  24  --  GLUCOSE 145*  --   --  131*  --  152* 136* 131*  BUN 12  --   --  9  --  CREATININE 0.88  --   --  0.75  --  0.80 0.82 0.71  CALCIUM 9.1  --   --  8.4*  --  8.0* 7.8* 8.2*  MG 1.6* 1.9 1.7 1.8  --   --   --   --   PHOS 3.7  --   --   --   --   --   --   --    GFR Estimated Creatinine Clearance: 48 mL/min (by C-G formula based on SCr of 0.71 mg/dL). Liver Function Tests: Recent Labs  Lab 05/12/22 2055  AST 29  ALT 19  ALKPHOS 46  BILITOT 1.0  PROT 6.3*  ALBUMIN 3.8   No results for input(s): "LIPASE", "AMYLASE" in the last 168 hours. No results for input(s): "AMMONIA" in the last 168 hours. Coagulation profile No results for input(s): "INR", "PROTIME" in the last 168 hours.  CBC: Recent Labs  Lab 05/12/22 2055 05/14/22 0301 05/15/22 0247 05/16/22 0220 05/17/22 0203 05/18/22 0239 05/19/22 0022  WBC 16.2*   < > 11.0* 8.7 8.7 7.2 8.0  NEUTROABS 13.7*   --   --   --   --   --   --   HGB 10.4*   < > 10.4* 7.4* 7.5* 7.4* 7.4*  HCT 30.1*   < > 29.4* 21.4* 21.7* 22.4* 22.6*  MCV 88.5   < > 89.1 91.1 92.3 93.7 93.8  PLT 159   < > 108* 107* 132* 159 207   < > = values in this interval not displayed.   Cardiac Enzymes: Recent Labs  Lab 05/12/22 2055  CKTOTAL 145   BNP: Invalid input(s): "POCBNP" CBG: No results for input(s): "GLUCAP" in the last 168 hours. D-Dimer No results for input(s): "DDIMER" in the last 72 hours. Hgb A1c No results for input(s): "HGBA1C" in the last 72 hours. Lipid Profile No results for input(s): "CHOL", "HDL", "LDLCALC", "TRIG", "CHOLHDL", "LDLDIRECT" in the last  72 hours. Thyroid function studies No results for input(s): "TSH", "T4TOTAL", "T3FREE", "THYROIDAB" in the last 72 hours.  Invalid input(s): "FREET3" Anemia work up No results for input(s): "VITAMINB12", "FOLATE", "FERRITIN", "TIBC", "IRON", "RETICCTPCT" in the last 72 hours. Microbiology Recent Results (from the past 240 hour(s))  Surgical pcr screen     Status: None   Collection Time: 05/14/22  4:30 AM   Specimen: Nasal Mucosa; Nasal Swab  Result Value Ref Range Status   MRSA, PCR NEGATIVE NEGATIVE Final   Staphylococcus aureus NEGATIVE NEGATIVE Final    Comment: (NOTE) The Xpert SA Assay (FDA approved for NASAL specimens in patients 67 years of age and older), is one component of a comprehensive surveillance program. It is not intended to diagnose infection nor to guide or monitor treatment. Performed at [facility], [address], [place], [place] [address]      Discharge Instructions:   Discharge Instructions     Diet general   Complete by: As directed    Discharge wound care:   Complete by: As directed    Re-in force dressing as needed   Increase activity slowly   Complete by: As directed       Allergies as of 05/19/2022   No Active Allergies      Medication List     STOP taking these medications     aspirin EC 81 MG tablet   methenamine 1 g tablet Commonly known as: HIPREX   nitroGLYCERIN 0.4 MG SL tablet Commonly known as: NITROSTAT       TAKE these medications    acetaminophen 500 MG tablet Commonly known as: TYLENOL Take 500-1,000 mg by mouth every 8 (eight) hours as needed for moderate pain or headache.   apixaban 2.5 MG Tabs tablet Commonly known as: Eliquis Take 1 tablet (2.5 mg total) by mouth 2 (two) times daily.   ascorbic acid 500 MG tablet Commonly known as: VITAMIN C Take 500 mg by mouth daily.   atorvastatin 80 MG tablet Commonly known as: LIPITOR Take 1 tablet (80 mg total) by mouth daily.   bisacodyl 10 MG suppository Commonly known as: DULCOLAX Place 1 suppository (10 mg total) rectally daily as needed for moderate constipation.   CALCIUM 600/VITAMIN D PO Take 1 tablet by mouth in the morning and at bedtime.   cetirizine 10 MG tablet Commonly known as: ZYRTEC Take 10 mg by mouth daily.   CRANBERRY EXTRACT PO Take 25,000 mg by mouth daily.   docusate sodium 100 MG capsule Commonly known as: Colace Take 1 capsule (100 mg total) by mouth 2 (two) times daily as needed for mild constipation.   feeding supplement Liqd Take 237 mLs by mouth 2 (two) times daily between meals.   latanoprost 0.005 % ophthalmic solution Commonly known as: XALATAN Place 1 drop into both eyes at bedtime.   metoprolol succinate 25 MG 24 hr tablet Commonly known as: TOPROL-XL Take 0.5 tablets (12.5 mg total) by mouth daily. What changed:  medication strength how much to take additional instructions   Multi-Vitamins Tabs Take 1 tablet by mouth daily.   omeprazole 20 MG capsule Commonly known as: PRILOSEC Take 20 mg by mouth daily.   oxyCODONE 5 MG immediate release tablet Commonly known as: Oxy IR/ROXICODONE Take 1-2 tablets (5-10 mg total) by mouth every 4 (four) hours as needed for moderate pain or severe pain (5 mg moderate pain, 10 mg severe pain).    polyethylene glycol 17 g packet Commonly known as:  MIRALAX / GLYCOLAX Take 17 g by mouth daily.   senna 8.6 MG Tabs tablet Commonly known as: SENOKOT Take 1 tablet (8.6 mg total) by mouth 2 (two) times daily.   SYSTANE OP Place 1 drop into both eyes daily as needed (dry eyes).   timolol 0.5 % ophthalmic solution Commonly known as: TIMOPTIC Place 1 drop into the left eye 2 (two) times daily.   traZODone 50 MG tablet Commonly known as: DESYREL Take 50 mg by mouth at bedtime as needed for sleep.   Trospium Chloride 60 MG Cp24 Take 1 capsule by mouth daily.   Trunature Digestive Probiotic Caps Take 1 capsule by mouth daily.   Vitamin D3 50 MCG (2000 UT) capsule Take 2,000 Units by mouth daily.               Discharge Care Instructions  (From admission, onward)           Start     Ordered   05/19/22 0000  Discharge wound care:       Comments: Re-in force dressing as needed   05/19/22 3614            Follow-up Information     Yolonda Kida, MD Follow up in 2 week(s).   Specialty: Orthopedic Surgery Contact information: [address] [place] [address] [place] [address] [phone]                  Time coordinating discharge: 45 min  Signed:  Joseph Art DO  [facility] 05/19/2022, 8:11 AM

## 2022-05-19 NOTE — Care Management Important Message (Signed)
Important Message  Patient Details  Name: Carrie Barnes MRN: [id number] Date of Birth: 1936-04-03   Medicare Important Message Given:  Yes     Hannah Beat 05/19/2022, 2:34 PM

## 2022-05-19 NOTE — Plan of Care (Signed)

## 2022-05-20 DIAGNOSIS — R338 Other retention of urine: Secondary | ICD-10-CM | POA: Diagnosis not present

## 2022-05-20 DIAGNOSIS — W19XXXA Unspecified fall, initial encounter: Secondary | ICD-10-CM | POA: Diagnosis not present

## 2022-05-20 DIAGNOSIS — S32401D Unspecified fracture of right acetabulum, subsequent encounter for fracture with routine healing: Secondary | ICD-10-CM | POA: Diagnosis not present

## 2022-05-20 LAB — HEMOGLOBIN AND HEMATOCRIT, BLOOD
HCT: 26.1 % — ABNORMAL LOW (ref 36.0–46.0)
Hemoglobin: 9.2 g/dL — ABNORMAL LOW (ref 12.0–15.0)

## 2022-05-20 LAB — PREPARE RBC (CROSSMATCH)

## 2022-05-20 MED ORDER — MAGIC MOUTHWASH
5.0000 mL | Freq: Three times a day (TID) | ORAL | Status: DC
  Administered 2022-05-20 – 2022-05-21 (×4): 5 mL via ORAL
  Filled 2022-05-20 (×6): qty 5

## 2022-05-20 MED ORDER — SODIUM CHLORIDE 0.9% IV SOLUTION: Freq: Once | INTRAVENOUS | Status: DC

## 2022-05-20 MED ORDER — LOPERAMIDE HCL 2 MG PO CAPS
2.0000 mg | ORAL_CAPSULE | Freq: Once | ORAL | Status: AC
  Administered 2022-05-20: 2 mg via ORAL
  Filled 2022-05-20: qty 1

## 2022-05-20 MED ORDER — APIXABAN 2.5 MG PO TABS
2.5000 mg | ORAL_TABLET | Freq: Two times a day (BID) | ORAL | Status: DC
  Administered 2022-05-20 – 2022-05-21 (×3): 2.5 mg via ORAL
  Filled 2022-05-20 (×3): qty 1

## 2022-05-20 NOTE — Progress Notes (Signed)
Occupational Therapy Treatment Patient Details Name: Carrie Barnes MRN: [id number] DOB: 1936/07/10 Today's Date: 05/20/2022   History of present illness 86 yr old female who presented 05/12/22 due to a fall in a parking lot. Pt found to have had an Acute comminuted fracture of the right acetabulum with extension into the iliac crest and acute fx of the R humeral head and neck.Pt s/p on 05/14/22 ORIF of R acetabular fx and ORIF of R superior pubic ramus fx. Pt s/p 10/5 reverse total sx of RUE . PMH: CAD last cardiac stenting in 2018, GERD, HLD, HTN   OT comments  Pt presented in chair and agreeable to work on RUE therapeutic exercises following MD orders. Pt at this time also shown and educated on donning/doffing of sling in session and how to complete UE dressing/bathing. Pt currently with functional limitations due to the deficits listed below (see OT Problem List).  Pt will benefit from skilled OT to increase their safety and independence with ADL and functional mobility for ADL to facilitate discharge to venue listed below.     Recommendations for follow up therapy are one component of a multi-disciplinary discharge planning process, led by the attending physician.  Recommendations may be updated based on patient status, additional functional criteria and insurance authorization.    Follow Up Recommendations  Skilled nursing-short term rehab (<3 hours/day)    Assistance Recommended at Discharge Frequent or constant Supervision/Assistance  Patient can return home with the following  Two people to help with walking and/or transfers;A lot of help with bathing/dressing/bathroom;Assistance with cooking/housework;Assist for transportation   Equipment Recommendations  None recommended by OT    Recommendations for Other Services      Precautions / Restrictions Precautions Precautions: Fall Required Braces or Orthoses: Sling Restrictions Weight Bearing Restrictions: Yes RUE Weight Bearing:  Non weight bearing RLE Weight Bearing: Touchdown weight bearing Other Position/Activity Restrictions: Per Dr. Sharon Seller, able to use PFRW with R UE only. Per ortho note "pendulums and scapular retractions immediately.  Full range of motion of the elbow and hand and wrist allowed as tolerated starting on day 1.  We will progress range of motion after we see her in the office in 2 weeks with x-rays."       Mobility Bed Mobility Overal bed mobility:  (Pt presented in bed)                  Transfers                   General transfer comment: completed scooting in chair foward and backwards with mo- max assist  but focused in session on ADLS in sitting and RUE ther ex     Balance Overall balance assessment: Needs assistance Sitting-balance support: Feet supported Sitting balance-Leahy Scale: Fair                                     ADL either performed or assessed with clinical judgement   ADL Overall ADL's : Needs assistance/impaired Eating/Feeding: Set up;Sitting   Grooming: Brushing hair;Set up;Sitting   Upper Body Bathing: Maximal assistance;Sitting   Lower Body Bathing: Maximal assistance;+2 for physical assistance;+2 for safety/equipment;Cueing for safety;Cueing for sequencing;Bed level   Upper Body Dressing : Maximal assistance;Sitting   Lower Body Dressing: Maximal assistance;+2 for physical assistance;+2 for safety/equipment;Bed level  Extremity/Trunk Assessment Upper Extremity Assessment Upper Extremity Assessment: RUE deficits/detail RUE Deficits / Details: s/p RUE reverse total sx RUE Sensation: WNL RUE Coordination: decreased fine motor;decreased gross motor   Lower Extremity Assessment Lower Extremity Assessment: Defer to PT evaluation        Vision   Vision Assessment?: No apparent visual deficits   Perception     Praxis      Cognition Arousal/Alertness: Awake/alert Behavior During Therapy:  WFL for tasks assessed/performed Overall Cognitive Status: Within Functional Limits for tasks assessed Area of Impairment: Problem solving                         Safety/Judgement: Decreased awareness of safety              Exercises Exercises: Shoulder, Hand exercises Shoulder Exercises Pendulum Exercise: AAROM, Right, 10 reps, Seated (3 reps) Shoulder Flexion:  (x 3 reps) Shoulder Extension:  (x 3 reps) Elbow Flexion: AROM, AAROM, Right, 10 reps, Other reps (comment) (3) Elbow Extension: AROM, AAROM, 10 reps, Right, Seated, Other reps (comment) (3) Wrist Flexion: AROM, Right, 10 reps, Seated, Other reps (comment) (3) Wrist Extension: AROM, AAROM, 10 reps, Seated, Other reps (comment) (3) Digit Composite Flexion: AAROM, AROM, 10 reps, Strengthening, Other reps (comment) (3) Hand Exercises Forearm Supination: AROM, 10 reps, Seated Forearm Pronation: AROM, 10 reps, Seated Wrist Flexion: AROM, 10 reps Wrist Extension: AROM, 10 reps, Right, Seated    Shoulder Instructions Shoulder Instructions Donning/doffing shirt without moving shoulder: Maximal assistance Donning/doffing sling/immobilizer: Maximal assistance Correct positioning of sling/immobilizer: Maximal assistance Pendulum exercises (written home exercise program): Moderate assistance ROM for elbow, wrist and digits of operated UE: Min-guard     General Comments      Pertinent Vitals/ Pain       Pain Assessment Pain Assessment: Faces Faces Pain Scale: Hurts little more Pain Location: R shoulder buttocks, bladder Pain Descriptors / Indicators: Grimacing, Guarding Pain Intervention(s): Limited activity within patient's tolerance, Monitored during session, Repositioned, Ice applied  Home Living                                          Prior Functioning/Environment              Frequency  Min 2X/week        Progress Toward Goals  OT Goals(current goals can now be found in  the care plan section)  Progress towards OT goals: Progressing toward goals  Acute Rehab OT Goals Patient Stated Goal: to move more OT Goal Formulation: With patient Time For Goal Achievement: 05/29/22 Potential to Achieve Goals: Good ADL Goals Pt Will Perform Eating: with modified independence;sitting Pt Will Perform Grooming: with min guard assist;sitting Pt Will Perform Upper Body Bathing: with min assist;sitting Pt Will Perform Lower Body Bathing: with mod assist;sitting/lateral leans Pt Will Transfer to Toilet: with max assist  Plan Discharge plan remains appropriate    Co-evaluation                 AM-PAC OT "6 Clicks" Daily Activity     Outcome Measure   Help from another person eating meals?: A Little Help from another person taking care of personal grooming?: A Little Help from another person toileting, which includes using toliet, bedpan, or urinal?: A Lot Help from another person bathing (including washing, rinsing, drying)?: A Lot Help from another person to  put on and taking off regular upper body clothing?: A Little Help from another person to put on and taking off regular lower body clothing?: A Lot 6 Click Score: 15    End of Session    OT Visit Diagnosis: Unsteadiness on feet (R26.81);Other abnormalities of gait and mobility (R26.89);Repeated falls (R29.6);Muscle weakness (generalized) (M62.81);History of falling (Z91.81);Pain Pain - Right/Left: Right Pain - part of body: Shoulder;Leg   Activity Tolerance Patient tolerated treatment well   Patient Left in chair;with call bell/phone within reach;with nursing/sitter in room   Nurse Communication          Time: 0923-1000 OT Time Calculation (min): 37 min  Charges: OT General Charges $OT Visit: 1 Visit OT Treatments $Therapeutic Exercise: 23-37 mins  Alphia Moh OTR/L  Acute Rehab Services  [phone] office number [phone] pager number   Alphia Moh 05/20/2022, 10:18 AM

## 2022-05-20 NOTE — TOC Progression Note (Signed)
Transition of Care [facility]) - Initial/Assessment Note    Patient Details  Name: Carrie Barnes MRN: [id number] Date of Birth: 1936-01-25  Transition of Care [facility]) CM/SW Contact:    Milinda Antis, [place] Phone Number: 05/20/2022, 1:21 PM  Clinical Narrative:                 CSW contacted the patient's daughter Renate and was informed that the family has chosen [facility].    CSW contacted admissions at [facility] and informed them that the family has accepted the bed offer.  CSW is awaiting a response.   CSW requested that TOC SWA begin insurance auth.  Expected Discharge Plan: Skilled Nursing Facility Barriers to Discharge: Insurance Authorization, Continued Medical Work up, SNF Pending bed offer   Patient Goals and CMS Choice Patient states their goals for this hospitalization and ongoing recovery are:: SNF CMS Medicare.gov Compare Post Acute Care list provided to:: Patient Choice offered to / list presented to : Patient  Expected Discharge Plan and Services Expected Discharge Plan: [place]       Living arrangements for the past 2 months: Single Family Home Expected Discharge Date: 05/20/22                                    Prior Living Arrangements/Services Living arrangements for the past 2 months: Single Family Home Lives with:: Self Patient language and need for interpreter reviewed:: Yes Do you feel safe going back to the place where you live?: Yes      Need for Family Participation in Patient Care: Yes (Comment) Care giver support system in place?: Yes (comment)   Criminal Activity/Legal Involvement Pertinent to Current Situation/Hospitalization: No - Comment as needed  Activities of Daily Living Home Assistive Devices/Equipment: Dentures (specify type), Eyeglasses (full set dentures) ADL Screening (condition at time of admission) Patient's cognitive ability adequate to safely complete daily activities?: Yes Is the patient deaf or  have difficulty hearing?: No (tinnitis in both ears) Does the patient have difficulty seeing, even when wearing glasses/contacts?: No Does the patient have difficulty concentrating, remembering, or making decisions?: No Patient able to express need for assistance with ADLs?: Yes Does the patient have difficulty dressing or bathing?: Yes Independently performs ADLs?: No Communication: Independent Dressing (OT): Needs assistance Is this a change from baseline?: Change from baseline, expected to last >3 days Grooming: Needs assistance Is this a change from baseline?: Change from baseline, expected to last >3 days Feeding: Needs assistance Is this a change from baseline?: Change from baseline, expected to last >3 days Bathing: Needs assistance Is this a change from baseline?: Change from baseline, expected to last >3 days Toileting: Needs assistance Is this a change from baseline?: Change from baseline, expected to last >3days In/Out Bed: Needs assistance Is this a change from baseline?: Change from baseline, expected to last >3 days Walks in Home: Dependent Is this a change from baseline?: Change from baseline, expected to last >3 days Does the patient have difficulty walking or climbing stairs?: Yes Weakness of Legs: Right Weakness of Arms/Hands: Right  Permission Sought/Granted Permission sought to share information with : Case Manager, [place] granted to share information with : Yes, Verbal Permission Granted     Permission granted to share info w AGENCY: SNF        Emotional Assessment Appearance:: Appears stated age Attitude/Demeanor/Rapport: Engaged Affect (typically observed): Appropriate  Orientation: : Oriented to Self, Oriented to Place, Oriented to  Time, Oriented to Situation Alcohol / Substance Use: Not Applicable Psych Involvement: No (comment)  Admission diagnosis:  Pelvis fracture ([place]) [S32.9XXA] Fall, initial encounter  [id number]] Closed fracture of proximal end of right humerus, unspecified fracture morphology, initial encounter [S42.201A] Closed displaced fracture of right acetabulum, unspecified portion of acetabulum, initial encounter [facility]) [S32.401A] Patient Active Problem List   Diagnosis Date Noted   Hypomagnesemia 05/13/2022   Pelvis fracture ([place]) 05/12/2022   Humeral head fracture, right, closed, initial encounter 05/12/2022   Elevated troponin 05/12/2022   Hematoma 05/12/2022   Hypertension 11/05/2021   Complete rotator cuff tear 09/20/2021   Closed intertrochanteric fracture of right hip ([place]) 07/11/2019   Closed right hip fracture ([place]) 07/11/2019   Chronic cholecystitis due to cholelithiasis with choledocholithiasis 11/05/2017   Cholelithiasis with chronic cholecystitis 11/01/2017   Cholelithiasis 05/18/2017   CAD in native artery 03/31/2017   Ischemic cardiomyopathy 03/31/2017   Hyperlipemia 03/31/2017   DJD (degenerative joint disease) 03/31/2017   Osteoporosis 03/31/2017   PCP:  Seward Carol, MD Pharmacy:   [facility] [place], [address] AT [place] [address] Phone: [phone] Fax: [phone]  [email or web address] [facility] SERV[facility]) [place], [place] [address] Phone: [phone] Fax: [phone]     Social Determinants of Health (SDOH) Interventions    Readmission Risk Interventions     No data to display

## 2022-05-20 NOTE — Progress Notes (Addendum)
Carrie Barnes  VOH:[id number] DOB: 09-09-1935 DOA: 05/12/2022 PCP: Seward Carol, MD    Brief Narrative:  86 year old with a history of CAD status post stenting in 2018, HLD, and HTN who suffered a mechanical fall in a parking lot after which she complained of severe pain in her hip.  In the ER x-rays revealed a comminuted fracture of the right humeral neck as well as an acute comminuted fracture of the right acetabulum with extension into the iliac crest.  Plan for the OR on 10/4 for hip and again on 10/5 for shoulder. Hgb stable at 7.4 but patient weak and SOB so will transfuse 1 unit PRBC prior to d/c  Consultants:  Orthopedics  Cardiology   Goals of Care:  Code Status: Full Code   DVT prophylaxis: Eliquis  Interim Hx: C/o being weak getting up with RN to chair  Assessment & Plan:  Acute fractures of the right acetabulum and right humerus status post mechanical fall Care per orthopedic surgery -s/p OR on 10/4 and again 10/5 PT/OT SNF -TOC consulted x2  Coronary artery disease status post DES 2018 Clinically stable - no hx suggestive of USAP or CHF - was seen by Cards at request of Ortho, and Cards confirmed there is no need for further pre-operative evaluation   Hypomagnesemia Corrected with supplementation  Suboptimal B12 B12 320 - supplement with goal of 400+  HTN Blood pressure well controlled at present  HLD Continue usual dose of Lipitor  Chronic normocytic anemia -given 2 units PRBC on 10/4 in the OR -hgb stable after at 7.4 -c/o weakness on 10/10-- will transfuse 1 unit of PRBC while awaiting SNF  Acute urinary retention x 1 -I/o cath -PRN bladder scan  Family Communication: no family present at time of exam  Disposition:  SNF   Objective: Blood pressure 121/60, pulse 95, temperature 98.9 F (37.2 C), temperature source Oral, resp. rate 18, height 5\' 2"  (1.575 m), weight 70 kg, SpO2 96 %.  Intake/Output Summary (Last 24 hours) at 05/20/2022  0819 Last data filed at 05/20/2022 0400 Gross per 24 hour  Intake --  Output 2800 ml  Net -2800 ml   Filed Weights   05/12/22 1627  Weight: 70 kg    Examination:     General: Appearance:     Overweight female in no acute distress     Lungs:     respirations unlabored  Heart:    Normal heart rate. Normal rhythm. No murmurs, rubs, or gallops.      Neurologic:   Awake, alert, oriented x 3. No apparent focal neurological           defect.          CBC: Recent Labs  Lab 05/17/22 0203 05/18/22 0239 05/19/22 0022  WBC 8.7 7.2 8.0  HGB 7.5* 7.4* 7.4*  HCT 21.7* 22.4* 22.6*  MCV 92.3 93.7 93.8  PLT 132* 159 694   Basic Metabolic Panel: Recent Labs  Lab 05/14/22 0301 05/14/22 1454 05/15/22 0247 05/16/22 0220 05/17/22 0203  NA 133*   < > 134* 138 138  K 4.0   < > 4.4 4.3 4.0  CL 101  --  102 104 103  CO2 24  --  26 28 31   GLUCOSE 131*  --  152* 136* 131*  BUN 9  --  10 10 11   CREATININE 0.75  --  0.80 0.82 0.71  CALCIUM 8.4*  --  8.0* 7.8* 8.2*  MG 1.8  --   --   --   --    < > =  values in this interval not displayed.   GFR: Estimated Creatinine Clearance: 48 mL/min (by C-G formula based on SCr of 0.71 mg/dL).   Scheduled Meds:  sodium chloride   Intravenous Once   acetaminophen  1,000 mg Oral Q6H   atorvastatin  80 mg Oral Daily   Chlorhexidine Gluconate Cloth  6 each Topical Daily   docusate sodium  100 mg Oral BID   feeding supplement  237 mL Oral BID BM   latanoprost  1 drop Both Eyes QHS   magic mouthwash  5 mL Oral TID   metoprolol succinate  12.5 mg Oral Daily   multivitamin with minerals  1 tablet Oral Daily   pantoprazole  40 mg Oral Daily   timolol  1 drop Left Eye BID   Urelle  1 tablet Oral QID   Continuous Infusions:  methocarbamol (ROBAXIN) IV       LOS: 8 days   Marlin Canary DO  If 7PM-7AM, please contact night-coverage per Amion 05/20/2022, 8:19 AM

## 2022-05-20 NOTE — Progress Notes (Signed)
Nutrition Follow-up  DOCUMENTATION CODES:  Not applicable  INTERVENTION:  Continue regular diet Add feeding assistance with tray set-up and cutting meats Ensure Enlive po BID, each supplement provides 350 kcal and 20 grams of protein. MVI with minerals daily  NUTRITION DIAGNOSIS:  Increased nutrient needs related to hip fracture as evidenced by estimated needs. -remains applicable  GOAL:  Patient will meet greater than or equal to 90% of their needs -progressing  MONITOR:   PO intake, Supplement acceptance, Diet advancement  REASON FOR ASSESSMENT:   Consult Hip fracture protocol  ASSESSMENT:  Pt with hx of CAD, HTN, and CHF presented to ED with shoulder and hip pain after a fall. Imaging showed fractures to the right humerus and the right hip.  Pt resting in bedside chair at the time of assessment. Breakfast tray noted at bedside poorly consumed. Pt reports that appetite is a little bit better now that she is no longer constipated but that she is having a hard time with her meals. Right arm is in a sling which is her dominate arm and reports that she is having a hard time setting up her tray, cutting her food, and eating in general. Asked nursing staff to assist with tray set-up and cutting items.   Pt will go for short term rehab after dc. Medically stable per MD, but is waiting for insurance approval.      Average Meal Intake: 10/3: 10% intake x 1 recorded meal 10/8: 50% intake x 1 recorded meal  Nutritionally Relevant Medications: Scheduled Meds:  atorvastatin  80 mg Oral Daily   docusate sodium  100 mg Oral BID   feeding supplement  237 mL Oral BID BM   magic mouthwash  5 mL Oral TID   multivitamin with minerals  1 tablet Oral Daily   pantoprazole  40 mg Oral Daily   Labs Reviewed  NUTRITION - FOCUSED PHYSICAL EXAM: Flowsheet Row Most Recent Value  Orbital Region No depletion  Upper Arm Region No depletion  Thoracic and Lumbar Region No depletion  Buccal  Region No depletion  Temple Region No depletion  Clavicle Bone Region No depletion  Clavicle and Acromion Bone Region No depletion  Scapular Bone Region No depletion  Dorsal Hand No depletion  Patellar Region No depletion  Anterior Thigh Region No depletion  Posterior Calf Region No depletion  Edema (RD Assessment) Mild  Hair Reviewed  Eyes Reviewed  Mouth Reviewed  Skin Reviewed  Nails Reviewed    Diet Order:   Diet Order             Diet general           Diet regular Room service appropriate? Yes; Fluid consistency: Thin  Diet effective now                   EDUCATION NEEDS:   No education needs have been identified at this time  Skin:  Skin Assessment: Reviewed RN Assessment  Last BM:  10/10 - type 6  Height:   Ht Readings from Last 1 Encounters:  05/12/22 5\' 2"  (1.575 m)    Weight:   Wt Readings from Last 1 Encounters:  05/12/22 70 kg    Ideal Body Weight:  50 kg  BMI:  Body mass index is 28.23 kg/m.  Estimated Nutritional Needs:  Kcal:  1500-1700 kcal/d Protein:  75-90g/d Fluid:  >/=1.5L/d    Ranell Patrick, RD, LDN Clinical Dietitian RD pager # available in [facility]  After hours/weekend pager #  available in [facility]

## 2022-05-21 DIAGNOSIS — F5101 Primary insomnia: Secondary | ICD-10-CM | POA: Diagnosis not present

## 2022-05-21 DIAGNOSIS — F4323 Adjustment disorder with mixed anxiety and depressed mood: Secondary | ICD-10-CM | POA: Diagnosis not present

## 2022-05-21 DIAGNOSIS — W19XXXA Unspecified fall, initial encounter: Secondary | ICD-10-CM | POA: Diagnosis not present

## 2022-05-21 DIAGNOSIS — M6281 Muscle weakness (generalized): Secondary | ICD-10-CM | POA: Diagnosis not present

## 2022-05-21 DIAGNOSIS — R5381 Other malaise: Secondary | ICD-10-CM | POA: Diagnosis not present

## 2022-05-21 DIAGNOSIS — S32401D Unspecified fracture of right acetabulum, subsequent encounter for fracture with routine healing: Secondary | ICD-10-CM | POA: Diagnosis not present

## 2022-05-21 DIAGNOSIS — Z96611 Presence of right artificial shoulder joint: Secondary | ICD-10-CM | POA: Diagnosis not present

## 2022-05-21 DIAGNOSIS — Z4789 Encounter for other orthopedic aftercare: Secondary | ICD-10-CM | POA: Diagnosis not present

## 2022-05-21 DIAGNOSIS — W010XXA Fall on same level from slipping, tripping and stumbling without subsequent striking against object, initial encounter: Secondary | ICD-10-CM | POA: Diagnosis not present

## 2022-05-21 DIAGNOSIS — R41841 Cognitive communication deficit: Secondary | ICD-10-CM | POA: Diagnosis not present

## 2022-05-21 DIAGNOSIS — I10 Essential (primary) hypertension: Secondary | ICD-10-CM | POA: Diagnosis not present

## 2022-05-21 DIAGNOSIS — Y92481 Parking lot as the place of occurrence of the external cause: Secondary | ICD-10-CM | POA: Diagnosis not present

## 2022-05-21 DIAGNOSIS — R531 Weakness: Secondary | ICD-10-CM | POA: Diagnosis not present

## 2022-05-21 DIAGNOSIS — I5022 Chronic systolic (congestive) heart failure: Secondary | ICD-10-CM | POA: Diagnosis not present

## 2022-05-21 DIAGNOSIS — Z7401 Bed confinement status: Secondary | ICD-10-CM | POA: Diagnosis not present

## 2022-05-21 DIAGNOSIS — S42201D Unspecified fracture of upper end of right humerus, subsequent encounter for fracture with routine healing: Secondary | ICD-10-CM | POA: Diagnosis not present

## 2022-05-21 DIAGNOSIS — Z9181 History of falling: Secondary | ICD-10-CM | POA: Diagnosis not present

## 2022-05-21 DIAGNOSIS — Z743 Need for continuous supervision: Secondary | ICD-10-CM | POA: Diagnosis not present

## 2022-05-21 DIAGNOSIS — R262 Difficulty in walking, not elsewhere classified: Secondary | ICD-10-CM | POA: Diagnosis not present

## 2022-05-21 DIAGNOSIS — R339 Retention of urine, unspecified: Secondary | ICD-10-CM | POA: Diagnosis not present

## 2022-05-21 DIAGNOSIS — R2681 Unsteadiness on feet: Secondary | ICD-10-CM | POA: Diagnosis not present

## 2022-05-21 DIAGNOSIS — S32599D Other specified fracture of unspecified pubis, subsequent encounter for fracture with routine healing: Secondary | ICD-10-CM | POA: Diagnosis not present

## 2022-05-21 DIAGNOSIS — K594 Anal spasm: Secondary | ICD-10-CM | POA: Diagnosis not present

## 2022-05-21 DIAGNOSIS — D62 Acute posthemorrhagic anemia: Secondary | ICD-10-CM | POA: Diagnosis not present

## 2022-05-21 DIAGNOSIS — Z23 Encounter for immunization: Secondary | ICD-10-CM | POA: Diagnosis not present

## 2022-05-21 DIAGNOSIS — N390 Urinary tract infection, site not specified: Secondary | ICD-10-CM | POA: Diagnosis not present

## 2022-05-21 DIAGNOSIS — S329XXD Fracture of unspecified parts of lumbosacral spine and pelvis, subsequent encounter for fracture with routine healing: Secondary | ICD-10-CM | POA: Diagnosis not present

## 2022-05-21 LAB — TYPE AND SCREEN
ABO/RH(D): A POS
Antibody Screen: NEGATIVE
Unit division: 0

## 2022-05-21 LAB — BPAM RBC
Blood Product Expiration Date: 202310172359
ISSUE DATE / TIME: 202310101459
Unit Type and Rh: 6200

## 2022-05-21 LAB — CBC
HCT: 26.7 % — ABNORMAL LOW (ref 36.0–46.0)
Hemoglobin: 9.2 g/dL — ABNORMAL LOW (ref 12.0–15.0)
MCH: 30.2 pg (ref 26.0–34.0)
MCHC: 34.5 g/dL (ref 30.0–36.0)
MCV: 87.5 fL (ref 80.0–100.0)
Platelets: 259 10*3/uL (ref 150–400)
RBC: 3.05 MIL/uL — ABNORMAL LOW (ref 3.87–5.11)
RDW: 14.8 % (ref 11.5–15.5)
WBC: 9.3 10*3/uL (ref 4.0–10.5)
nRBC: 0.2 % (ref 0.0–0.2)

## 2022-05-21 MED ORDER — OXYCODONE HCL 5 MG PO TABS: 5.0000 mg | ORAL_TABLET | ORAL | 0 refills | Status: DC | PRN

## 2022-05-21 NOTE — Progress Notes (Signed)
RN called [facility] and gave report to [facility] pt discharge paperwork in drawer, IV has been removed pt waiting for PTAR.

## 2022-05-21 NOTE — Progress Notes (Signed)
PTAR at bedside pt Oxycodone script in DC packet, IV has been removed and belongings packed

## 2022-05-21 NOTE — TOC Transition Note (Signed)
Transition of Care [facility]) - CM/SW Discharge Note   Patient Details  Name: TENYA ARAQUE MRN: [id number] Date of Birth: 04/27/1936  Transition of Care [facility]) CM/SW Contact:  Tresa Endo Phone Number: 05/21/2022, 12:07 PM   Clinical Narrative:    Patient will DC to: [facility] Anticipated DC date: 05/21/2022 Family notified: Pt daughter Transport by: Corey Harold   Per MD patient ready for DC to [facility] room 804B. RN to call report prior to discharge [phone]). RN, patient, patient's family, and facility notified of DC. Discharge Summary and FL2 sent to facility. DC packet on chart. Ambulance transport requested for patient.   CSW will sign off for now as social work intervention is no longer needed. Please consult [place] again if new needs arise.       Barriers to Discharge: Ship broker, Continued Medical Work up, SNF Pending bed offer   Patient Goals and CMS Choice Patient states their goals for this hospitalization and ongoing recovery are:: SNF CMS Medicare.gov Compare Post Acute Care list provided to:: Patient Choice offered to / list presented to : Patient  Discharge Placement                       Discharge Plan and Services                                     Social Determinants of Health (SDOH) Interventions     Readmission Risk Interventions     No data to display

## 2022-05-21 NOTE — Discharge Summary (Signed)
Physician Discharge Summary  Carrie Barnes ZOX:[id number] DOB: 28-May-1936 DOA: 05/12/2022  PCP: Renford Dills, MD  Admit date: 05/12/2022 Discharge date: 05/21/2022  Admitted From: home Disposition:  SNF  Recommendations for Outpatient Follow-up:  Follow up with PCP in 1-2 weeks Please obtain BMP/CBC in one week Please follow up on the following pending results:  Discharge Condition: stable CODE STATUS: Full code   HPI: Per admitting MD, Carrie Barnes is a 86 y.o. female with medical history significant of CAD last cardiac stenting in 2018, GERD, HLD, HTN. Presented with    fall and right hip pain Had a mechanical fall in the parking lot did not hit her head no C not on blood thinners.  Noted to have pain in her right shoulder and right hip. Patient does take baby aspirin Patient has known CAD was admitted to [facility] in August 2018 and had MI with drug-eluting stent placed in LAD initially she did have evidence of CHF but her EF has improved since then and she has been followed by cardiology At baseline walks independently  Hospital Course / Discharge diagnoses: Principal Problem:   Pelvis fracture (HCC) Active Problems:   CAD in native artery   Ischemic cardiomyopathy   Hyperlipemia   Osteoporosis   Hypertension   Humeral head fracture, right, closed, initial encounter   Elevated troponin   Hematoma   Hypomagnesemia  Principal problem Acute fractures of the right acetabulum and right humerus status post mechanical fall-patient was admitted to the hospital and orthopedic surgery was consulted.  She was taken to the OR by Dr. Jena Gauss on 10/4 and is status post ORIF of right acetabular and superior pubic ramus fracture, and was again taken to the OR on 10/5 by Dr. Aundria Rud for her right proximal humerus fracture status post reverse shoulder arthroplasty.  Recovered well postoperatively, PT recommends SNF  active problems Coronary artery disease status post  DES 2018 - Clinically stable, was seen by cardiology at request of orthopedic surgery.  No need for further testing at this point Hypomagnesemia-Corrected with supplementation Suboptimal B12-B12 320 - supplement with goal of 400+ HTN-continue home regimen HLD-Continue usual dose of Lipitor Chronic normocytic anemia, acute blood loss anemia postoperatively-status post total of 3 units of packed red blood cells, hemoglobin improved appropriately and has remained stable.  Continue to monitor outpatient periodically Urinary retention x 1 - improved  Sepsis ruled out   Discharge Instructions  Discharge Instructions     Diet general   Complete by: As directed    Discharge wound care:   Complete by: As directed    Re-in force dressing as needed   Increase activity slowly   Complete by: As directed       Allergies as of 05/21/2022   No Active Allergies      Medication List     STOP taking these medications    aspirin EC 81 MG tablet   methenamine 1 g tablet Commonly known as: HIPREX   nitroGLYCERIN 0.4 MG SL tablet Commonly known as: NITROSTAT       TAKE these medications    acetaminophen 500 MG tablet Commonly known as: TYLENOL Take 500-1,000 mg by mouth every 8 (eight) hours as needed for moderate pain or headache.   apixaban 2.5 MG Tabs tablet Commonly known as: Eliquis Take 1 tablet (2.5 mg total) by mouth 2 (two) times daily.   ascorbic acid 500 MG tablet Commonly known as: VITAMIN C Take 500 mg by  mouth daily.   atorvastatin 80 MG tablet Commonly known as: LIPITOR Take 1 tablet (80 mg total) by mouth daily.   bisacodyl 10 MG suppository Commonly known as: DULCOLAX Place 1 suppository (10 mg total) rectally daily as needed for moderate constipation.   CALCIUM 600/VITAMIN D PO Take 1 tablet by mouth in the morning and at bedtime.   cetirizine 10 MG tablet Commonly known as: ZYRTEC Take 10 mg by mouth daily.   CRANBERRY EXTRACT PO Take 25,000 mg  by mouth daily.   docusate sodium 100 MG capsule Commonly known as: Colace Take 1 capsule (100 mg total) by mouth 2 (two) times daily as needed for mild constipation.   feeding supplement Liqd Take 237 mLs by mouth 2 (two) times daily between meals.   latanoprost 0.005 % ophthalmic solution Commonly known as: XALATAN Place 1 drop into both eyes at bedtime.   metoprolol succinate 25 MG 24 hr tablet Commonly known as: TOPROL-XL Take 0.5 tablets (12.5 mg total) by mouth daily. What changed:  medication strength how much to take additional instructions   Multi-Vitamins Tabs Take 1 tablet by mouth daily.   omeprazole 20 MG capsule Commonly known as: PRILOSEC Take 20 mg by mouth daily.   oxyCODONE 5 MG immediate release tablet Commonly known as: Oxy IR/ROXICODONE Take 1-2 tablets (5-10 mg total) by mouth every 4 (four) hours as needed for moderate pain or severe pain (5 mg moderate pain, 10 mg severe pain).   polyethylene glycol 17 g packet Commonly known as: MIRALAX / GLYCOLAX Take 17 g by mouth daily.   senna 8.6 MG Tabs tablet Commonly known as: SENOKOT Take 1 tablet (8.6 mg total) by mouth 2 (two) times daily.   SYSTANE OP Place 1 drop into both eyes daily as needed (dry eyes).   timolol 0.5 % ophthalmic solution Commonly known as: TIMOPTIC Place 1 drop into the left eye 2 (two) times daily.   traZODone 50 MG tablet Commonly known as: DESYREL Take 50 mg by mouth at bedtime as needed for sleep.   Trospium Chloride 60 MG Cp24 Take 1 capsule by mouth daily.   Trunature Digestive Probiotic Caps Take 1 capsule by mouth daily.   Vitamin D3 50 MCG (2000 UT) capsule Take 2,000 Units by mouth daily.               Discharge Care Instructions  (From admission, onward)           Start     Ordered   05/19/22 0000  Discharge wound care:       Comments: Re-in force dressing as needed   05/19/22 6834            Follow-up Information     Yolonda Kida, MD Follow up in 2 week(s).   Specialty: Orthopedic Surgery Contact information: [address] [place] [address] [phone]                 Consultations: Ortho surgery  Procedures/Studies:  CT HIP RIGHT WO CONTRAST  Result Date: 05/16/2022 CLINICAL DATA:  Acetabular fracture status post ORIF EXAM: CT OF THE RIGHT HIP WITHOUT CONTRAST TECHNIQUE: Multidetector CT imaging of the right hip was performed according to the standard protocol. Multiplanar CT image reconstructions were also generated. RADIATION DOSE REDUCTION: This exam was performed according to the departmental dose-optimization program which includes automated exposure control, adjustment of the mA and/or kV according to patient size and/or use of iterative reconstruction technique. COMPARISON:  05/12/2022 FINDINGS: Bones/Joint/Cartilage Interval ORIF of comminuted right acetabular and right superior pubic rami fractures with plate and screw fixation constructs. Improved fracture alignment, now near anatomic. The right inferior pubic ramus fracture remains mildly displaced and angulated. Prior right proximal femoral ORIF is unchanged. No pubic diastasis. No new fractures. Ligaments Suboptimally assessed by CT. Muscles and Tendons No acute musculotendinous findings. Soft tissues Small right pelvic sidewall hematoma has slightly increased in size measuring approximately 6.8 x 1.6 cm in axial dimension. Small volume layering fluid and blood products within the presacral space, increased. Expected postsurgical changes within the soft tissues about the pelvis. Air is seen within the urinary bladder. IMPRESSION: 1. Interval ORIF of comminuted right acetabular and right superior pubic rami fractures with improved fracture alignment, now near anatomic. 2. Small right pelvic sidewall hematoma has slightly increased in size. 3. Small volume layering fluid and blood products within the presacral space,  increased. 4. Air is seen within the urinary bladder. Correlate for recent instrumentation. Electronically Signed   By: Duanne Guess D.O.   On: 05/16/2022 09:15   DG Shoulder Right Port  Result Date: 05/15/2022 CLINICAL DATA:  Status post right shoulder arthroplasty EXAM: RIGHT SHOULDER - 1 VIEW COMPARISON:  05/12/2022 FINDINGS: Right shoulder replacement is noted. Remnants of the previous proximal humeral fracture are noted. No other focal abnormality is seen. IMPRESSION: Status post right shoulder arthroplasty. Electronically Signed   By: Alcide Clever M.D.   On: 05/15/2022 20:51   DG Pelvis Comp Min 3V  Result Date: 05/14/2022 CLINICAL DATA:  Status post open reduction internal fixation of right acetabular fracture. EXAM: JUDET PELVIS - 3+ VIEW COMPARISON:  Fluoroscopic images of same day.  May 12, 2022. FINDINGS: Status post surgical internal fixation of right acetabular fracture. Mildly displaced and comminuted fracture of right inferior pubic ramus is noted. Remote surgical internal fixation of proximal right femoral fracture is noted. IMPRESSION: Status post surgical internal fixation of right acetabular fracture. Mildly displaced and comminuted fracture of right inferior pubic ramus. Electronically Signed   By: Lupita Raider M.D.   On: 05/14/2022 18:14   DG Pelvis Comp Min 3V  Result Date: 05/14/2022 CLINICAL DATA:  Fracture right acetabulum EXAM: JUDET PELVIS - 3+ VIEW COMPARISON:  05/12/2022 FINDINGS: There is interval internal fixation of fracture of right acetabulum and right iliac bone. There is previous internal fixation in the right femur. IMPRESSION: Status post internal fixation of fracture of right acetabulum and right iliac bone. Electronically Signed   By: Ernie Avena M.D.   On: 05/14/2022 15:41   DG C-Arm 1-60 Min-No Report  Result Date: 05/14/2022 Fluoroscopy was utilized by the requesting physician.  No radiographic interpretation.   DG C-Arm 1-60 Min-No  Report  Result Date: 05/14/2022 Fluoroscopy was utilized by the requesting physician.  No radiographic interpretation.   ECHOCARDIOGRAM COMPLETE  Result Date: 05/13/2022    ECHOCARDIOGRAM REPORT   Patient Name:   Carrie Barnes [facility] Date of Exam: 05/13/2022 Medical Rec #:  [id number]                Height:       62.0 in Accession #:    [id number]               Weight:       154.3 lb Date of Birth:  [birth year]                 BSA:  1.712 m Patient Age:    86 years                 BP:           135/68 mmHg Patient Gender: F                        HR:           96 bpm. Exam Location:  Inpatient Procedure: 2D Echo, Cardiac Doppler, Color Doppler and Intracardiac            Opacification Agent Indications:    Elevated troponin  History:        Patient has prior history of Echocardiogram examinations, most                 recent 07/12/2019. CHF, CAD and Previous Myocardial Infarction;                 Risk Factors:Hypertension and Dyslipidemia.  Sonographer:    Milda Smart Referring Phys: [id number] ANASTASSIA DOUTOVA  Sonographer Comments: Technically difficult study due to poor echo windows. Image acquisition challenging due to patient body habitus and Image acquisition challenging due to respiratory motion. IMPRESSIONS  1. Left ventricular ejection fraction, by estimation, is 65 to 70%. The left ventricle has normal function. The left ventricle has no regional wall motion abnormalities. Left ventricular diastolic parameters are consistent with Grade I diastolic dysfunction (impaired relaxation).  2. Right ventricular systolic function is normal. The right ventricular size is normal.  3. The mitral valve is normal in structure. No evidence of mitral valve regurgitation.  4. The aortic valve is tricuspid. There is mild calcification of the aortic valve. There is mild thickening of the aortic valve. Aortic valve regurgitation is not visualized. Aortic valve sclerosis is present, with no evidence of aortic  valve stenosis. Comparison(s): No significant change from prior study. Prior images reviewed side by side. FINDINGS  Left Ventricle: Left ventricular ejection fraction, by estimation, is 65 to 70%. The left ventricle has normal function. The left ventricle has no regional wall motion abnormalities. Definity contrast agent was given IV to delineate the left ventricular  endocardial borders. The left ventricular internal cavity size was normal in size. There is borderline concentric left ventricular hypertrophy. Left ventricular diastolic parameters are consistent with Grade I diastolic dysfunction (impaired relaxation). Indeterminate filling pressures. Right Ventricle: The right ventricular size is normal. No increase in right ventricular wall thickness. Right ventricular systolic function is normal. Left Atrium: Left atrial size was normal in size. Right Atrium: Right atrial size was normal in size. Pericardium: There is no evidence of pericardial effusion. Mitral Valve: The mitral valve is normal in structure. Mild mitral annular calcification. No evidence of mitral valve regurgitation. Tricuspid Valve: The tricuspid valve is normal in structure. Tricuspid valve regurgitation is not demonstrated. Aortic Valve: The aortic valve is tricuspid. There is mild calcification of the aortic valve. There is mild thickening of the aortic valve. Aortic valve regurgitation is not visualized. Aortic valve sclerosis is present, with no evidence of aortic valve stenosis. Pulmonic Valve: The pulmonic valve was normal in structure. Pulmonic valve regurgitation is not visualized. Aorta: The aortic root and ascending aorta are structurally normal, with no evidence of dilitation. IAS/Shunts: No atrial level shunt detected by color flow Doppler.  LEFT VENTRICLE PLAX 2D LVIDd:         4.40 cm     Diastology LVIDs:  3.70 cm     LV e' medial:    5.55 cm/s LV PW:         1.20 cm     LV E/e' medial:  12.8 LV IVS:        1.10 cm     LV  e' lateral:   5.33 cm/s LVOT diam:     1.90 cm     LV E/e' lateral: 13.4 LV SV:         62 LV SV Index:   36 LVOT Area:     2.84 cm  LV Volumes (MOD) LV vol d, MOD A2C: 53.8 ml LV vol d, MOD A4C: 60.1 ml LV vol s, MOD A2C: 27.3 ml LV vol s, MOD A4C: 31.2 ml LV SV MOD A2C:     26.5 ml LV SV MOD A4C:     60.1 ml LV SV MOD BP:      28.3 ml RIGHT VENTRICLE RV S prime:     13.10 cm/s LEFT ATRIUM             Index        RIGHT ATRIUM           Index LA diam:        3.20 cm 1.87 cm/m   RA Area:     13.10 cm LA Vol (A2C):   45.6 ml 26.63 ml/m  RA Volume:   28.20 ml  16.47 ml/m LA Vol (A4C):   28.8 ml 16.82 ml/m LA Biplane Vol: 37.7 ml 22.02 ml/m  AORTIC VALVE LVOT Vmax:   126.00 cm/s LVOT Vmean:  86.800 cm/s LVOT VTI:    0.217 m  AORTA Ao Root diam: 3.20 cm Ao Asc diam:  3.30 cm MITRAL VALVE MV Area (PHT): 3.42 cm    SHUNTS MV Decel Time: 222 msec    Systemic VTI:  0.22 m MV E velocity: 71.30 cm/s  Systemic Diam: 1.90 cm MV A velocity: 93.80 cm/s MV E/A ratio:  0.76 Mihai Croitoru MD Electronically signed by Thurmon Fair MD Signature Date/Time: 05/13/2022/10:00:57 AM    Final    CT Head Wo Contrast  Result Date: 05/12/2022 CLINICAL DATA:  Head trauma, minor (Age >= 65y); Neck trauma (Age >= 65y). Fall, shoulder and pelvic fractures , prev ct head and c-spine 07/11/19 EXAM: CT HEAD WITHOUT CONTRAST CT CERVICAL SPINE WITHOUT CONTRAST TECHNIQUE: Multidetector CT imaging of the head and cervical spine was performed following the standard protocol without intravenous contrast. Multiplanar CT image reconstructions of the cervical spine were also generated. RADIATION DOSE REDUCTION: This exam was performed according to the departmental dose-optimization program which includes automated exposure control, adjustment of the mA and/or kV according to patient size and/or use of iterative reconstruction technique. COMPARISON:  CT head and C-spine 07/11/2019 FINDINGS: CT HEAD FINDINGS BRAIN: BRAIN Cerebral ventricle sizes  are concordant with the degree of cerebral volume loss. No evidence of large-territorial acute infarction. No parenchymal hemorrhage. No mass lesion. No extra-axial collection. No mass effect or midline shift. No hydrocephalus. Basilar cisterns are patent. Vascular: No hyperdense vessel. Skull: No acute fracture or focal lesion. Sinuses/Orbits: Paranasal sinuses and mastoid air cells are clear. Bilateral lens replacement. Otherwise the orbits are unremarkable. Other: None. CT CERVICAL SPINE FINDINGS Alignment: Normal. Skull base and vertebrae: Multilevel moderate severe degenerative changes with osteophyte formation, facet arthropathy, uncovertebral arthropathy. No associated severe osseous neural foraminal or central canal stenosis. No acute fracture. No aggressive appearing focal osseous lesion or focal pathologic process.  Soft tissues and spinal canal: No prevertebral fluid or swelling. No visible canal hematoma. Upper chest: Unremarkable. Other: 1 cm right thyroid gland hypodensity. Not clinically significant; no follow-up imaging recommended (ref: J Am Coll Radiol. 2015 Feb;12(2): 143-50). IMPRESSION: 1. No acute intracranial abnormality. 2. No acute displaced fracture or traumatic listhesis of the cervical spine. Electronically Signed   By: Iven Finn M.D.   On: 05/12/2022 23:09   CT Cervical Spine Wo Contrast  Result Date: 05/12/2022 CLINICAL DATA:  Head trauma, minor (Age >= 65y); Neck trauma (Age >= 65y). Fall, shoulder and pelvic fractures , prev ct head and c-spine 07/11/19 EXAM: CT HEAD WITHOUT CONTRAST CT CERVICAL SPINE WITHOUT CONTRAST TECHNIQUE: Multidetector CT imaging of the head and cervical spine was performed following the standard protocol without intravenous contrast. Multiplanar CT image reconstructions of the cervical spine were also generated. RADIATION DOSE REDUCTION: This exam was performed according to the departmental dose-optimization program which includes automated exposure  control, adjustment of the mA and/or kV according to patient size and/or use of iterative reconstruction technique. COMPARISON:  CT head and C-spine 07/11/2019 FINDINGS: CT HEAD FINDINGS BRAIN: BRAIN Cerebral ventricle sizes are concordant with the degree of cerebral volume loss. No evidence of large-territorial acute infarction. No parenchymal hemorrhage. No mass lesion. No extra-axial collection. No mass effect or midline shift. No hydrocephalus. Basilar cisterns are patent. Vascular: No hyperdense vessel. Skull: No acute fracture or focal lesion. Sinuses/Orbits: Paranasal sinuses and mastoid air cells are clear. Bilateral lens replacement. Otherwise the orbits are unremarkable. Other: None. CT CERVICAL SPINE FINDINGS Alignment: Normal. Skull base and vertebrae: Multilevel moderate severe degenerative changes with osteophyte formation, facet arthropathy, uncovertebral arthropathy. No associated severe osseous neural foraminal or central canal stenosis. No acute fracture. No aggressive appearing focal osseous lesion or focal pathologic process. Soft tissues and spinal canal: No prevertebral fluid or swelling. No visible canal hematoma. Upper chest: Unremarkable. Other: 1 cm right thyroid gland hypodensity. Not clinically significant; no follow-up imaging recommended (ref: J Am Coll Radiol. 2015 Feb;12(2): 143-50). IMPRESSION: 1. No acute intracranial abnormality. 2. No acute displaced fracture or traumatic listhesis of the cervical spine. Electronically Signed   By: Iven Finn M.D.   On: 05/12/2022 23:09   DG Chest Port 1 View  Result Date: 05/12/2022 CLINICAL DATA:  Proper evaluation, hip and shoulder fracture EXAM: PORTABLE CHEST 1 VIEW COMPARISON:  05/16/2021 FINDINGS: Single frontal view of the chest demonstrates a stable cardiac silhouette. No airspace disease, effusion, or pneumothorax. Comminuted right humeral neck fracture. IMPRESSION: 1. Comminuted right humeral neck fracture. 2. No acute  intrathoracic process. Electronically Signed   By: Randa Ngo M.D.   On: 05/12/2022 22:48   CT Shoulder Right Wo Contrast  Result Date: 05/12/2022 CLINICAL DATA:  Shoulder trauma, fracture of humerus or scapula EXAM: CT OF THE UPPER RIGHT EXTREMITY WITHOUT CONTRAST TECHNIQUE: Multidetector CT imaging of the upper right extremity was performed according to the standard protocol. RADIATION DOSE REDUCTION: This exam was performed according to the departmental dose-optimization program which includes automated exposure control, adjustment of the mA and/or kV according to patient size and/or use of iterative reconstruction technique. COMPARISON:  Same-day x-ray FINDINGS: Bones/Joint/Cartilage Acute fracture of the right humeral head and neck with transversely oriented component through the surgical neck. Humeral neck fracture impacted by approximately 2 cm. There is approximately 1.4 cm of anterior displacement. Nondisplaced fracture component involves the greater and lesser tuberosities. There appears to be disruption of the humeral head articular surface inferiorly. Glenohumeral  joint alignment maintained without dislocation. Remaining visualized osseous structures are otherwise intact. Normal alignment at the [facility] joint. Ligaments Suboptimally assessed by CT. Muscles and Tendons Enlargement of the anterior deltoid muscle adjacent to the fracture site, likely representing a component of intramuscular hemorrhage. Atrophy of the infraspinatus muscle. Soft tissues Generalized soft tissue swelling about the shoulder. No right axillary lymphadenopathy. Aortic atherosclerosis. Included right lung field is clear. IMPRESSION: 1. Acute fracture of the right humeral head and neck, as described above. 2. Enlargement of the anterior deltoid muscle adjacent to the fracture site, likely representing a component of intramuscular hemorrhage. Aortic Atherosclerosis (ICD10-I70.0). Electronically Signed   By: Duanne Guess D.O.    On: 05/12/2022 19:21   CT PELVIS WO CONTRAST  Result Date: 05/12/2022 CLINICAL DATA:  Hip trauma, fracture suspected, xray done EXAM: CT PELVIS WITHOUT CONTRAST TECHNIQUE: Multidetector CT imaging of the pelvis was performed following the standard protocol without intravenous contrast. RADIATION DOSE REDUCTION: This exam was performed according to the departmental dose-optimization program which includes automated exposure control, adjustment of the mA and/or kV according to patient size and/or use of iterative reconstruction technique. COMPARISON:  03/02/2017 FINDINGS: Urinary Tract:  No abnormality visualized. Bowel: Sigmoid diverticulosis. No active inflammation or evidence of bowel obstruction within the pelvis. Vascular/Lymphatic: Aortoiliac atherosclerosis. Vascular findings are not well assessed on noncontrast imaging. Reproductive:  No mass or other significant abnormality Other:  Small right pelvic sidewall hematoma. Musculoskeletal: Acute comminuted fracture of the right acetabulum with extension into the iliac crest. Acetabular fracture involves both the anterior and posterior columns. Femoroacetabular joint alignment is maintained without dislocation. Comminuted, displaced and angulated fracture of the mid right inferior pubic ramus. Fullness within the right obturator externus and adductor magnus muscles adjacent to the right inferior pubic ramus likely reflecting a component of intramuscular hematoma. No pubic diastasis. Sacroiliac joints intact without diastasis. Prior ORIF of the proximal right femur. No hardware complication or femur fracture. IMPRESSION: 1. Acute comminuted fracture of the right acetabulum with extension into the iliac crest. 2. Small right pelvic sidewall hematoma. 3. Right inferior pubic ramus fracture with adjacent intramuscular hematomas involving the right obturator externus and adductor magnus muscles. 4. Aortic atherosclerosis (ICD10-I70.0). Electronically Signed   By:  Duanne Guess D.O.   On: 05/12/2022 18:32   DG Hip Unilat W or Wo Pelvis 2-3 Views Right  Result Date: 05/12/2022 CLINICAL DATA:  Trauma, fall EXAM: DG HIP (WITH OR WITHOUT PELVIS) 2-3V RIGHT COMPARISON:  07/11/2019 FINDINGS: There is previous internal fixation of intertrochanteric fracture of right femur with intramedullary rod. There is deformity in right inferior pubic ramus suggesting recent or old fracture. There is offset in alignment in the cortical margin in the right iliac crest suggesting possible fracture. IMPRESSION: Previous internal fixation of fracture of proximal right femur. Deformities in the right iliac crest and right inferior pubic ramus suggest possible recent fractures. If clinically warranted, follow-up CT may be considered. Electronically Signed   By: Ernie Avena M.D.   On: 05/12/2022 17:19   DG Shoulder Right  Result Date: 05/12/2022 CLINICAL DATA:  Trauma, fall EXAM: RIGHT SHOULDER - 2+ VIEW COMPARISON:  07/14/2019 FINDINGS: There is severely comminuted fracture in the neck cough right humerus. There is superior displacement of shaft of humerus in relation to the head. In the scapular Y-view, humeral head is projecting slightly more anterior than usual. IMPRESSION: Comminuted fracture is seen in the neck of right humerus. Possible anterior subluxation/dislocation in the right shoulder. Electronically Signed  By: Ernie Avena M.D.   On: 05/12/2022 17:15     Subjective: - no chest pain, shortness of breath, no abdominal pain, nausea or vomiting.   Discharge Exam: BP 126/62 (BP Location: Left Arm)   Pulse 90   Temp 98.1 F (36.7 C) (Oral)   Resp 16   Ht 5\' 2"  (1.575 m)   Wt 70 kg   SpO2 98%   BMI 28.23 kg/m   General: Pt is alert, awake, not in acute distress Cardiovascular: RRR, S1/S2 +, no rubs, no gallops Respiratory: CTA bilaterally, no wheezing, no rhonchi Abdominal: Soft, NT, ND, bowel sounds + Extremities: no edema, no cyanosis   The  results of significant diagnostics from this hospitalization (including imaging, microbiology, ancillary and laboratory) are listed below for reference.     Microbiology: Recent Results (from the past 240 hour(s))  Surgical pcr screen     Status: None   Collection Time: 05/14/22  4:30 AM   Specimen: Nasal Mucosa; Nasal Swab  Result Value Ref Range Status   MRSA, PCR NEGATIVE NEGATIVE Final   Staphylococcus aureus NEGATIVE NEGATIVE Final    Comment: (NOTE) The Xpert SA Assay (FDA approved for NASAL specimens in patients 63 years of age and older), is one component of a comprehensive surveillance program. It is not intended to diagnose infection nor to guide or monitor treatment. Performed at [facility], [address], [place], [place]      Labs: Basic Metabolic Panel: Recent Labs  Lab 05/14/22 1454 05/15/22 0247 05/16/22 0220 05/17/22 0203  NA 135 134* 138 138  K 4.9 4.4 4.3 4.0  CL  --  102 104 103  CO2  --  26 28 31   GLUCOSE  --  152* 136* 131*  BUN  --  10 10 11   CREATININE  --  0.80 0.82 0.71  CALCIUM  --  8.0* 7.8* 8.2*   Liver Function Tests: No results for input(s): "AST", "ALT", "ALKPHOS", "BILITOT", "PROT", "ALBUMIN" in the last 168 hours. CBC: Recent Labs  Lab 05/16/22 0220 05/17/22 0203 05/18/22 0239 05/19/22 0022 05/20/22 2112 05/21/22 0205  WBC 8.7 8.7 7.2 8.0  --  9.3  HGB 7.4* 7.5* 7.4* 7.4* 9.2* 9.2*  HCT 21.4* 21.7* 22.4* 22.6* 26.1* 26.7*  MCV 91.1 92.3 93.7 93.8  --  87.5  PLT 107* 132* 159 207  --  259   CBG: No results for input(s): "GLUCAP" in the last 168 hours. Hgb A1c No results for input(s): "HGBA1C" in the last 72 hours. Lipid Profile No results for input(s): "CHOL", "HDL", "LDLCALC", "TRIG", "CHOLHDL", "LDLDIRECT" in the last 72 hours. Thyroid function studies No results for input(s): "TSH", "T4TOTAL", "T3FREE", "THYROIDAB" in the last 72 hours.  Invalid input(s): "FREET3" Urinalysis    Component Value  Date/Time   COLORURINE YELLOW 05/13/2022 0056   APPEARANCEUR CLEAR 05/13/2022 0056   LABSPEC 1.011 05/13/2022 0056   PHURINE 6.0 05/13/2022 0056   GLUCOSEU NEGATIVE 05/13/2022 0056   HGBUR NEGATIVE 05/13/2022 0056   BILIRUBINUR NEGATIVE 05/13/2022 0056   KETONESUR 5 (A) 05/13/2022 0056   PROTEINUR NEGATIVE 05/13/2022 0056   NITRITE NEGATIVE 05/13/2022 0056   LEUKOCYTESUR MODERATE (A) 05/13/2022 0056    FURTHER DISCHARGE INSTRUCTIONS:   Get Medicines reviewed and adjusted: Please take all your medications with you for your next visit with your Primary MD   Laboratory/radiological data: Please request your Primary MD to go over all hospital tests and procedure/radiological results at the follow up,  please ask your Primary MD to get all Hospital records sent to his/her office.   In some cases, they will be blood work, cultures and biopsy results pending at the time of your discharge. Please request that your primary care M.D. goes through all the records of your hospital data and follows up on these results.   Also Note the following: If you experience worsening of your admission symptoms, develop shortness of breath, life threatening emergency, suicidal or homicidal thoughts you must seek medical attention immediately by calling 911 or calling your MD immediately  if symptoms less severe.   You must read complete instructions/literature along with all the possible adverse reactions/side effects for all the Medicines you take and that have been prescribed to you. Take any new Medicines after you have completely understood and accpet all the possible adverse reactions/side effects.    Do not drive when taking Pain medications or sleeping medications (Benzodaizepines)   Do not take more than prescribed Pain, Sleep and Anxiety Medications. It is not advisable to combine anxiety,sleep and pain medications without talking with your primary care practitioner   Special Instructions: If you  have smoked or chewed Tobacco  in the last 2 yrs please stop smoking, stop any regular Alcohol  and or any Recreational drug use.   Wear Seat belts while driving.   Please note: You were cared for by a hospitalist during your hospital stay. Once you are discharged, your primary care physician will handle any further medical issues. Please note that NO REFILLS for any discharge medications will be authorized once you are discharged, as it is imperative that you return to your primary care physician (or establish a relationship with a primary care physician if you do not have one) for your post hospital discharge needs so that they can reassess your need for medications and monitor your lab values.  Time coordinating discharge: 40 minutes  SIGNED:  Pamella Pert, MD, PhD 05/21/2022, 11:11 AM

## 2022-05-21 NOTE — Progress Notes (Signed)
Physical Therapy Treatment Patient Details Name: Carrie Barnes MRN: [id number] DOB: May 19, 1936 Today's Date: 05/21/2022   History of Present Illness 86 yr old female who presented 05/12/22 due to a fall in a parking lot. Pt found to have had an Acute comminuted fracture of the right acetabulum with extension into the iliac crest and acute fx of the R humeral head and neck.Pt s/p on 05/14/22 ORIF of R acetabular fx and ORIF of R superior pubic ramus fx. Pt s/p 10/5 reverse total sx of RUE . PMH: CAD last cardiac stenting in 2018, GERD, HLD, HTN    PT Comments    Pt pleasant and motivated to participate in PT session. Focus on RLE strengthening/ROM exercises and transfer training. Pt requiring two person maximal assist for squat pivot transfers from bed to chair towards left. Difficulty maintaining weightbearing status on RLE. May benefit from trialing slide board transfer. Continue to recommend SNF for ongoing Physical Therapy.      Recommendations for follow up therapy are one component of a multi-disciplinary discharge planning process, led by the attending physician.  Recommendations may be updated based on patient status, additional functional criteria and insurance authorization.  Follow Up Recommendations  Skilled nursing-short term rehab (<3 hours/day) Can patient physically be transported by private vehicle: No   Assistance Recommended at Discharge Frequent or constant Supervision/Assistance  Patient can return home with the following Two people to help with walking and/or transfers;A lot of help with bathing/dressing/bathroom;Assistance with cooking/housework;Assist for transportation;Help with stairs or ramp for entrance   Equipment Recommendations  Other (comment) (defer)    Recommendations for Other Services       Precautions / Restrictions Precautions Precautions: Fall Restrictions Weight Bearing Restrictions: Yes RUE Weight Bearing: Non weight bearing RLE Weight  Bearing: Touchdown weight bearing Other Position/Activity Restrictions: Per Dr. Sharon Seller, able to use PFRW with R UE only. Per ortho note "pendulums and scapular retractions immediately.  Full range of motion of the elbow and hand and wrist allowed as tolerated starting on day 1.  We will progress range of motion after we see her in the office in 2 weeks with x-rays."     Mobility  Bed Mobility Overal bed mobility: Needs Assistance Bed Mobility: Supine to Sit     Supine to sit: Mod assist, +2 for physical assistance     General bed mobility comments: Assist for RLE off edge of bed and trunk to upright    Transfers Overall transfer level: Needs assistance Equipment used: None Transfers: Bed to chair/wheelchair/BSC       Squat pivot transfers: Max assist, +2 physical assistance     General transfer comment: MaxA + 2 for squat pivot towards left from bed to chair, cues for placing RLE anteriorly to prevent weightbearing, difficulty sequencing. practiced clearing hips from edge of bed several times    Ambulation/Gait                   Stairs             Wheelchair Mobility    Modified Rankin (Stroke Patients Only)       Balance Overall balance assessment: Needs assistance Sitting-balance support: Feet supported Sitting balance-Leahy Scale: Fair                                      Cognition Arousal/Alertness: Awake/alert Behavior During Therapy: WFL for tasks assessed/performed Overall Cognitive  Status: Impaired/Different from baseline Area of Impairment: Problem solving                             Problem Solving: Difficulty sequencing, Requires verbal cues          Exercises General Exercises - Lower Extremity Long Arc Quad: Both, 10 reps, Seated Heel Slides: AAROM, Right, 10 reps, Supine Hip ABduction/ADduction: AAROM, Right, 10 reps, Supine    General Comments        Pertinent Vitals/Pain Pain  Assessment Pain Assessment: Faces Faces Pain Scale: Hurts little more Pain Location: R shoulder, RLE Pain Descriptors / Indicators: Grimacing, Guarding Pain Intervention(s): Limited activity within patient's tolerance, Monitored during session, Premedicated before session    Home Living                          Prior Function            PT Goals (current goals can now be found in the care plan section) Acute Rehab PT Goals Patient Stated Goal: to get stronger Potential to Achieve Goals: Good    Frequency    Min 3X/week      PT Plan Current plan remains appropriate    Co-evaluation              AM-PAC PT "6 Clicks" Mobility   Outcome Measure  Help needed turning from your back to your side while in a flat bed without using bedrails?: A Lot Help needed moving from lying on your back to sitting on the side of a flat bed without using bedrails?: A Lot Help needed moving to and from a bed to a chair (including a wheelchair)?: Total Help needed standing up from a chair using your arms (e.g., wheelchair or bedside chair)?: Total Help needed to walk in hospital room?: Total Help needed climbing 3-5 steps with a railing? : Total 6 Click Score: 8    End of Session Equipment Utilized During Treatment: Gait belt;Oxygen Activity Tolerance: Patient tolerated treatment well Patient left: in chair;with call bell/phone within reach;with chair alarm set Nurse Communication: Mobility status PT Visit Diagnosis: Muscle weakness (generalized) (M62.81);Unsteadiness on feet (R26.81);Other abnormalities of gait and mobility (R26.89)     Time: 6606-3016 PT Time Calculation (min) (ACUTE ONLY): 25 min  Charges:  $Therapeutic Activity: 23-37 mins                     Wyona Almas, PT, DPT Acute Rehabilitation Services Office [phone]    Deno Etienne 05/21/2022, 1:06 PM

## 2022-05-22 DIAGNOSIS — Z96611 Presence of right artificial shoulder joint: Secondary | ICD-10-CM | POA: Diagnosis not present

## 2022-05-22 DIAGNOSIS — S42201D Unspecified fracture of upper end of right humerus, subsequent encounter for fracture with routine healing: Secondary | ICD-10-CM | POA: Diagnosis not present

## 2022-05-22 DIAGNOSIS — S32599D Other specified fracture of unspecified pubis, subsequent encounter for fracture with routine healing: Secondary | ICD-10-CM | POA: Diagnosis not present

## 2022-05-22 DIAGNOSIS — S32401D Unspecified fracture of right acetabulum, subsequent encounter for fracture with routine healing: Secondary | ICD-10-CM | POA: Diagnosis not present

## 2022-05-23 DIAGNOSIS — S42201D Unspecified fracture of upper end of right humerus, subsequent encounter for fracture with routine healing: Secondary | ICD-10-CM | POA: Diagnosis not present

## 2022-05-23 DIAGNOSIS — S32599D Other specified fracture of unspecified pubis, subsequent encounter for fracture with routine healing: Secondary | ICD-10-CM | POA: Diagnosis not present

## 2022-05-23 DIAGNOSIS — R339 Retention of urine, unspecified: Secondary | ICD-10-CM | POA: Diagnosis not present

## 2022-05-23 DIAGNOSIS — S32401D Unspecified fracture of right acetabulum, subsequent encounter for fracture with routine healing: Secondary | ICD-10-CM | POA: Diagnosis not present

## 2022-05-27 DIAGNOSIS — R339 Retention of urine, unspecified: Secondary | ICD-10-CM | POA: Diagnosis not present

## 2022-05-27 DIAGNOSIS — S32401D Unspecified fracture of right acetabulum, subsequent encounter for fracture with routine healing: Secondary | ICD-10-CM | POA: Diagnosis not present

## 2022-05-27 DIAGNOSIS — S42201D Unspecified fracture of upper end of right humerus, subsequent encounter for fracture with routine healing: Secondary | ICD-10-CM | POA: Diagnosis not present

## 2022-05-27 DIAGNOSIS — K594 Anal spasm: Secondary | ICD-10-CM | POA: Diagnosis not present

## 2022-05-29 DIAGNOSIS — R2681 Unsteadiness on feet: Secondary | ICD-10-CM | POA: Diagnosis not present

## 2022-05-29 DIAGNOSIS — Z9181 History of falling: Secondary | ICD-10-CM | POA: Diagnosis not present

## 2022-05-29 DIAGNOSIS — M6281 Muscle weakness (generalized): Secondary | ICD-10-CM | POA: Diagnosis not present

## 2022-05-29 DIAGNOSIS — R5381 Other malaise: Secondary | ICD-10-CM | POA: Diagnosis not present

## 2022-06-02 DIAGNOSIS — R2681 Unsteadiness on feet: Secondary | ICD-10-CM | POA: Diagnosis not present

## 2022-06-02 DIAGNOSIS — R5381 Other malaise: Secondary | ICD-10-CM | POA: Diagnosis not present

## 2022-06-02 DIAGNOSIS — Z9181 History of falling: Secondary | ICD-10-CM | POA: Diagnosis not present

## 2022-06-02 DIAGNOSIS — M6281 Muscle weakness (generalized): Secondary | ICD-10-CM | POA: Diagnosis not present

## 2022-06-03 DIAGNOSIS — S32401D Unspecified fracture of right acetabulum, subsequent encounter for fracture with routine healing: Secondary | ICD-10-CM | POA: Diagnosis not present

## 2022-06-03 DIAGNOSIS — S329XXD Fracture of unspecified parts of lumbosacral spine and pelvis, subsequent encounter for fracture with routine healing: Secondary | ICD-10-CM | POA: Diagnosis not present

## 2022-06-04 DIAGNOSIS — R2681 Unsteadiness on feet: Secondary | ICD-10-CM | POA: Diagnosis not present

## 2022-06-04 DIAGNOSIS — M6281 Muscle weakness (generalized): Secondary | ICD-10-CM | POA: Diagnosis not present

## 2022-06-04 DIAGNOSIS — R5381 Other malaise: Secondary | ICD-10-CM | POA: Diagnosis not present

## 2022-06-04 DIAGNOSIS — Z9181 History of falling: Secondary | ICD-10-CM | POA: Diagnosis not present

## 2022-06-09 DIAGNOSIS — Z9181 History of falling: Secondary | ICD-10-CM | POA: Diagnosis not present

## 2022-06-09 DIAGNOSIS — R2681 Unsteadiness on feet: Secondary | ICD-10-CM | POA: Diagnosis not present

## 2022-06-09 DIAGNOSIS — M6281 Muscle weakness (generalized): Secondary | ICD-10-CM | POA: Diagnosis not present

## 2022-06-09 DIAGNOSIS — R5381 Other malaise: Secondary | ICD-10-CM | POA: Diagnosis not present

## 2022-06-10 DIAGNOSIS — Z4789 Encounter for other orthopedic aftercare: Secondary | ICD-10-CM | POA: Diagnosis not present

## 2022-06-11 DIAGNOSIS — R5381 Other malaise: Secondary | ICD-10-CM | POA: Diagnosis not present

## 2022-06-11 DIAGNOSIS — M6281 Muscle weakness (generalized): Secondary | ICD-10-CM | POA: Diagnosis not present

## 2022-06-11 DIAGNOSIS — Z9181 History of falling: Secondary | ICD-10-CM | POA: Diagnosis not present

## 2022-06-11 DIAGNOSIS — R2681 Unsteadiness on feet: Secondary | ICD-10-CM | POA: Diagnosis not present

## 2022-06-16 DIAGNOSIS — R339 Retention of urine, unspecified: Secondary | ICD-10-CM | POA: Diagnosis not present

## 2022-06-16 DIAGNOSIS — S32401D Unspecified fracture of right acetabulum, subsequent encounter for fracture with routine healing: Secondary | ICD-10-CM | POA: Diagnosis not present

## 2022-06-16 DIAGNOSIS — S32599D Other specified fracture of unspecified pubis, subsequent encounter for fracture with routine healing: Secondary | ICD-10-CM | POA: Diagnosis not present

## 2022-06-16 DIAGNOSIS — S42201D Unspecified fracture of upper end of right humerus, subsequent encounter for fracture with routine healing: Secondary | ICD-10-CM | POA: Diagnosis not present

## 2022-06-19 DIAGNOSIS — R5381 Other malaise: Secondary | ICD-10-CM | POA: Diagnosis not present

## 2022-06-19 DIAGNOSIS — Z9181 History of falling: Secondary | ICD-10-CM | POA: Diagnosis not present

## 2022-06-19 DIAGNOSIS — R2681 Unsteadiness on feet: Secondary | ICD-10-CM | POA: Diagnosis not present

## 2022-06-19 DIAGNOSIS — M6281 Muscle weakness (generalized): Secondary | ICD-10-CM | POA: Diagnosis not present

## 2022-06-24 DIAGNOSIS — R2681 Unsteadiness on feet: Secondary | ICD-10-CM | POA: Diagnosis not present

## 2022-06-24 DIAGNOSIS — Z9181 History of falling: Secondary | ICD-10-CM | POA: Diagnosis not present

## 2022-06-24 DIAGNOSIS — R5381 Other malaise: Secondary | ICD-10-CM | POA: Diagnosis not present

## 2022-06-24 DIAGNOSIS — S32401D Unspecified fracture of right acetabulum, subsequent encounter for fracture with routine healing: Secondary | ICD-10-CM | POA: Diagnosis not present

## 2022-06-24 DIAGNOSIS — M6281 Muscle weakness (generalized): Secondary | ICD-10-CM | POA: Diagnosis not present

## 2022-06-24 DIAGNOSIS — S329XXD Fracture of unspecified parts of lumbosacral spine and pelvis, subsequent encounter for fracture with routine healing: Secondary | ICD-10-CM | POA: Diagnosis not present

## 2022-06-26 DIAGNOSIS — R2681 Unsteadiness on feet: Secondary | ICD-10-CM | POA: Diagnosis not present

## 2022-06-26 DIAGNOSIS — M6281 Muscle weakness (generalized): Secondary | ICD-10-CM | POA: Diagnosis not present

## 2022-06-26 DIAGNOSIS — R5381 Other malaise: Secondary | ICD-10-CM | POA: Diagnosis not present

## 2022-06-26 DIAGNOSIS — Z9181 History of falling: Secondary | ICD-10-CM | POA: Diagnosis not present

## 2022-06-30 DIAGNOSIS — Z9181 History of falling: Secondary | ICD-10-CM | POA: Diagnosis not present

## 2022-06-30 DIAGNOSIS — M6281 Muscle weakness (generalized): Secondary | ICD-10-CM | POA: Diagnosis not present

## 2022-06-30 DIAGNOSIS — R5381 Other malaise: Secondary | ICD-10-CM | POA: Diagnosis not present

## 2022-06-30 DIAGNOSIS — R2681 Unsteadiness on feet: Secondary | ICD-10-CM | POA: Diagnosis not present

## 2022-07-02 DIAGNOSIS — M6281 Muscle weakness (generalized): Secondary | ICD-10-CM | POA: Diagnosis not present

## 2022-07-02 DIAGNOSIS — R2681 Unsteadiness on feet: Secondary | ICD-10-CM | POA: Diagnosis not present

## 2022-07-02 DIAGNOSIS — Z9181 History of falling: Secondary | ICD-10-CM | POA: Diagnosis not present

## 2022-07-02 DIAGNOSIS — R5381 Other malaise: Secondary | ICD-10-CM | POA: Diagnosis not present

## 2022-07-07 DIAGNOSIS — R5381 Other malaise: Secondary | ICD-10-CM | POA: Diagnosis not present

## 2022-07-07 DIAGNOSIS — Z9181 History of falling: Secondary | ICD-10-CM | POA: Diagnosis not present

## 2022-07-07 DIAGNOSIS — M6281 Muscle weakness (generalized): Secondary | ICD-10-CM | POA: Diagnosis not present

## 2022-07-07 DIAGNOSIS — R2681 Unsteadiness on feet: Secondary | ICD-10-CM | POA: Diagnosis not present

## 2022-07-08 DIAGNOSIS — Z4789 Encounter for other orthopedic aftercare: Secondary | ICD-10-CM | POA: Diagnosis not present

## 2022-07-09 DIAGNOSIS — M6281 Muscle weakness (generalized): Secondary | ICD-10-CM | POA: Diagnosis not present

## 2022-07-09 DIAGNOSIS — R2681 Unsteadiness on feet: Secondary | ICD-10-CM | POA: Diagnosis not present

## 2022-07-09 DIAGNOSIS — R5381 Other malaise: Secondary | ICD-10-CM | POA: Diagnosis not present

## 2022-07-09 DIAGNOSIS — Z9181 History of falling: Secondary | ICD-10-CM | POA: Diagnosis not present

## 2022-07-10 DIAGNOSIS — D62 Acute posthemorrhagic anemia: Secondary | ICD-10-CM | POA: Diagnosis not present

## 2022-07-10 DIAGNOSIS — I5022 Chronic systolic (congestive) heart failure: Secondary | ICD-10-CM | POA: Diagnosis not present

## 2022-07-10 DIAGNOSIS — S32401D Unspecified fracture of right acetabulum, subsequent encounter for fracture with routine healing: Secondary | ICD-10-CM | POA: Diagnosis not present

## 2022-07-10 DIAGNOSIS — R339 Retention of urine, unspecified: Secondary | ICD-10-CM | POA: Diagnosis not present

## 2022-07-14 DIAGNOSIS — S3282XD Multiple fractures of pelvis without disruption of pelvic ring, subsequent encounter for fracture with routine healing: Secondary | ICD-10-CM | POA: Diagnosis not present

## 2022-07-14 DIAGNOSIS — S32401D Unspecified fracture of right acetabulum, subsequent encounter for fracture with routine healing: Secondary | ICD-10-CM | POA: Diagnosis not present

## 2022-07-14 DIAGNOSIS — S42201D Unspecified fracture of upper end of right humerus, subsequent encounter for fracture with routine healing: Secondary | ICD-10-CM | POA: Diagnosis not present

## 2022-07-14 DIAGNOSIS — D649 Anemia, unspecified: Secondary | ICD-10-CM | POA: Diagnosis not present

## 2022-07-14 DIAGNOSIS — I2510 Atherosclerotic heart disease of native coronary artery without angina pectoris: Secondary | ICD-10-CM | POA: Diagnosis not present

## 2022-07-14 DIAGNOSIS — M1990 Unspecified osteoarthritis, unspecified site: Secondary | ICD-10-CM | POA: Diagnosis not present

## 2022-07-14 DIAGNOSIS — Z96611 Presence of right artificial shoulder joint: Secondary | ICD-10-CM | POA: Diagnosis not present

## 2022-07-14 DIAGNOSIS — R339 Retention of urine, unspecified: Secondary | ICD-10-CM | POA: Diagnosis not present

## 2022-07-14 DIAGNOSIS — E785 Hyperlipidemia, unspecified: Secondary | ICD-10-CM | POA: Diagnosis not present

## 2022-07-14 DIAGNOSIS — I429 Cardiomyopathy, unspecified: Secondary | ICD-10-CM | POA: Diagnosis not present

## 2022-07-14 DIAGNOSIS — K219 Gastro-esophageal reflux disease without esophagitis: Secondary | ICD-10-CM | POA: Diagnosis not present

## 2022-07-14 DIAGNOSIS — I5022 Chronic systolic (congestive) heart failure: Secondary | ICD-10-CM | POA: Diagnosis not present

## 2022-07-14 DIAGNOSIS — Z7982 Long term (current) use of aspirin: Secondary | ICD-10-CM | POA: Diagnosis not present

## 2022-07-14 DIAGNOSIS — M810 Age-related osteoporosis without current pathological fracture: Secondary | ICD-10-CM | POA: Diagnosis not present

## 2022-07-14 DIAGNOSIS — I252 Old myocardial infarction: Secondary | ICD-10-CM | POA: Diagnosis not present

## 2022-07-14 DIAGNOSIS — I110 Hypertensive heart disease with heart failure: Secondary | ICD-10-CM | POA: Diagnosis not present

## 2022-07-21 DIAGNOSIS — E7800 Pure hypercholesterolemia, unspecified: Secondary | ICD-10-CM | POA: Diagnosis not present

## 2022-07-21 DIAGNOSIS — S42309A Unspecified fracture of shaft of humerus, unspecified arm, initial encounter for closed fracture: Secondary | ICD-10-CM | POA: Diagnosis not present

## 2022-07-21 DIAGNOSIS — W19XXXA Unspecified fall, initial encounter: Secondary | ICD-10-CM | POA: Diagnosis not present

## 2022-07-21 DIAGNOSIS — D649 Anemia, unspecified: Secondary | ICD-10-CM | POA: Diagnosis not present

## 2022-07-21 DIAGNOSIS — I10 Essential (primary) hypertension: Secondary | ICD-10-CM | POA: Diagnosis not present

## 2022-07-21 DIAGNOSIS — S329XXA Fracture of unspecified parts of lumbosacral spine and pelvis, initial encounter for closed fracture: Secondary | ICD-10-CM | POA: Diagnosis not present

## 2022-07-28 DIAGNOSIS — M1990 Unspecified osteoarthritis, unspecified site: Secondary | ICD-10-CM | POA: Diagnosis not present

## 2022-07-28 DIAGNOSIS — I10 Essential (primary) hypertension: Secondary | ICD-10-CM | POA: Diagnosis not present

## 2022-08-11 ENCOUNTER — Emergency Department (HOSPITAL_BASED_OUTPATIENT_CLINIC_OR_DEPARTMENT_OTHER)
Admission: EM | Admit: 2022-08-11 | Discharge: 2022-08-11 | Disposition: A | Discharge status: Home / Self Care | Payer: Medicare Other | Attending: Emergency Medicine | Admitting: Emergency Medicine

## 2022-08-11 ENCOUNTER — Other Ambulatory Visit: Payer: Self-pay

## 2022-08-11 ENCOUNTER — Emergency Department (HOSPITAL_BASED_OUTPATIENT_CLINIC_OR_DEPARTMENT_OTHER): Payer: Medicare Other | Admitting: Radiology

## 2022-08-11 ENCOUNTER — Emergency Department (HOSPITAL_BASED_OUTPATIENT_CLINIC_OR_DEPARTMENT_OTHER): Payer: Medicare Other

## 2022-08-11 ENCOUNTER — Encounter (HOSPITAL_BASED_OUTPATIENT_CLINIC_OR_DEPARTMENT_OTHER): Payer: Self-pay | Admitting: Radiology

## 2022-08-11 DIAGNOSIS — Z1152 Encounter for screening for COVID-19: Secondary | ICD-10-CM | POA: Diagnosis not present

## 2022-08-11 DIAGNOSIS — I480 Paroxysmal atrial fibrillation: Secondary | ICD-10-CM | POA: Insufficient documentation

## 2022-08-11 DIAGNOSIS — N644 Mastodynia: Secondary | ICD-10-CM | POA: Insufficient documentation

## 2022-08-11 DIAGNOSIS — R079 Chest pain, unspecified: Secondary | ICD-10-CM | POA: Diagnosis not present

## 2022-08-11 DIAGNOSIS — M791 Myalgia, unspecified site: Secondary | ICD-10-CM | POA: Insufficient documentation

## 2022-08-11 DIAGNOSIS — I10 Essential (primary) hypertension: Secondary | ICD-10-CM | POA: Insufficient documentation

## 2022-08-11 DIAGNOSIS — Z7901 Long term (current) use of anticoagulants: Secondary | ICD-10-CM | POA: Diagnosis not present

## 2022-08-11 DIAGNOSIS — I2510 Atherosclerotic heart disease of native coronary artery without angina pectoris: Secondary | ICD-10-CM | POA: Insufficient documentation

## 2022-08-11 DIAGNOSIS — R630 Anorexia: Secondary | ICD-10-CM | POA: Diagnosis not present

## 2022-08-11 DIAGNOSIS — Z79899 Other long term (current) drug therapy: Secondary | ICD-10-CM | POA: Insufficient documentation

## 2022-08-11 DIAGNOSIS — R0602 Shortness of breath: Secondary | ICD-10-CM | POA: Diagnosis not present

## 2022-08-11 DIAGNOSIS — J90 Pleural effusion, not elsewhere classified: Secondary | ICD-10-CM | POA: Diagnosis not present

## 2022-08-11 DIAGNOSIS — R7989 Other specified abnormal findings of blood chemistry: Secondary | ICD-10-CM | POA: Diagnosis not present

## 2022-08-11 LAB — BASIC METABOLIC PANEL
Anion gap: 11 (ref 5–15)
BUN: 16 mg/dL (ref 8–23)
CO2: 29 mmol/L (ref 22–32)
Calcium: 9.8 mg/dL (ref 8.9–10.3)
Chloride: 93 mmol/L — ABNORMAL LOW (ref 98–111)
Creatinine, Ser: 0.63 mg/dL (ref 0.44–1.00)
GFR, Estimated: >60 mL/min (ref >60)
Glucose, Bld: 129 mg/dL — ABNORMAL HIGH (ref 70–99)
Potassium: 4.5 mmol/L (ref 3.5–5.1)
Sodium: 133 mmol/L — ABNORMAL LOW (ref 135–145)

## 2022-08-11 LAB — CBC
HCT: 32.2 % — ABNORMAL LOW (ref 36.0–46.0)
Hemoglobin: 10.8 g/dL — ABNORMAL LOW (ref 12.0–15.0)
MCH: 29.6 pg (ref 26.0–34.0)
MCHC: 33.5 g/dL (ref 30.0–36.0)
MCV: 88.2 fL (ref 80.0–100.0)
Platelets: 250 10*3/uL (ref 150–400)
RBC: 3.65 MIL/uL — ABNORMAL LOW (ref 3.87–5.11)
RDW: 12.6 % (ref 11.5–15.5)
WBC: 10.4 10*3/uL (ref 4.0–10.5)
nRBC: 0 % (ref 0.0–0.2)

## 2022-08-11 LAB — TROPONIN I (HIGH SENSITIVITY)
Troponin I (High Sensitivity): 5 ng/L
Troponin I (High Sensitivity): 5 ng/L (ref <18)
Troponin I (High Sensitivity): 7 ng/L (ref <18)

## 2022-08-11 LAB — RESP PANEL BY RT-PCR (RSV, FLU A&B, COVID)  RVPGX2
Influenza A by PCR: NEGATIVE
Influenza B by PCR: NEGATIVE
Resp Syncytial Virus by PCR: NEGATIVE
SARS Coronavirus 2 by RT PCR: NEGATIVE

## 2022-08-11 LAB — D-DIMER, QUANTITATIVE: D-Dimer, Quant: 1.01 ug/mL-FEU — ABNORMAL HIGH (ref 0.00–0.50)

## 2022-08-11 LAB — BRAIN NATRIURETIC PEPTIDE: B Natriuretic Peptide: 89.3 pg/mL (ref 0.0–100.0)

## 2022-08-11 MED ORDER — DILTIAZEM HCL ER 60 MG PO CP12: 60.0000 mg | ORAL_CAPSULE | Freq: Two times a day (BID) | ORAL | 0 refills | Status: DC

## 2022-08-11 MED ORDER — APIXABAN 2.5 MG PO TABS
5.0000 mg | ORAL_TABLET | Freq: Two times a day (BID) | ORAL | Status: DC
  Administered 2022-08-11: 5 mg via ORAL
  Filled 2022-08-11: qty 2

## 2022-08-11 MED ORDER — ACETAMINOPHEN 325 MG PO TABS
650.0000 mg | ORAL_TABLET | Freq: Once | ORAL | Status: AC
  Administered 2022-08-11: 650 mg via ORAL
  Filled 2022-08-11: qty 2

## 2022-08-11 MED ORDER — SODIUM CHLORIDE 0.9 % IV BOLUS
1000.0000 mL | Freq: Once | INTRAVENOUS | Status: AC
  Administered 2022-08-11: 1000 mL via INTRAVENOUS

## 2022-08-11 MED ORDER — HEPARIN (PORCINE) 25000 UT/250ML-% IV SOLN
950.0000 [IU]/h | INTRAVENOUS | Status: DC
  Administered 2022-08-11: 950 [IU]/h via INTRAVENOUS
  Filled 2022-08-11: qty 250

## 2022-08-11 MED ORDER — HEPARIN BOLUS VIA INFUSION
3500.0000 [IU] | Freq: Once | INTRAVENOUS | Status: AC
  Administered 2022-08-11: 3500 [IU] via INTRAVENOUS

## 2022-08-11 MED ORDER — MORPHINE SULFATE (PF) 2 MG/ML IV SOLN
2.0000 mg | Freq: Once | INTRAVENOUS | Status: AC
  Administered 2022-08-11: 2 mg via INTRAVENOUS
  Filled 2022-08-11: qty 1

## 2022-08-11 MED ORDER — HYDROMORPHONE HCL 1 MG/ML IJ SOLN
0.5000 mg | Freq: Once | INTRAMUSCULAR | Status: AC
  Administered 2022-08-11: 0.5 mg via INTRAVENOUS
  Filled 2022-08-11: qty 1

## 2022-08-11 MED ORDER — DILTIAZEM HCL-DEXTROSE 125-5 MG/125ML-% IV SOLN (PREMIX)
5.0000 mg/h | INTRAVENOUS | Status: DC
  Administered 2022-08-11: 7.5 mg/h via INTRAVENOUS
  Administered 2022-08-11 (×2): 5 mg/h via INTRAVENOUS
  Filled 2022-08-11: qty 125

## 2022-08-11 MED ORDER — KETOROLAC TROMETHAMINE 15 MG/ML IJ SOLN
15.0000 mg | Freq: Once | INTRAMUSCULAR | Status: AC
  Administered 2022-08-11: 15 mg via INTRAVENOUS
  Filled 2022-08-11: qty 1

## 2022-08-11 MED ORDER — SODIUM CHLORIDE 0.9 % IV SOLN
1.0000 g | Freq: Once | INTRAVENOUS | Status: AC
  Administered 2022-08-11: 1 g via INTRAVENOUS
  Filled 2022-08-11: qty 10

## 2022-08-11 MED ORDER — DILTIAZEM HCL 30 MG PO TABS
30.0000 mg | ORAL_TABLET | Freq: Two times a day (BID) | ORAL | Status: DC
  Administered 2022-08-11: 30 mg via ORAL
  Filled 2022-08-11: qty 1

## 2022-08-11 MED ORDER — APIXABAN (ELIQUIS) EDUCATION KIT FOR DVT/PE PATIENTS: PACK | Freq: Once | Status: AC

## 2022-08-11 MED ORDER — IOHEXOL 350 MG/ML SOLN
100.0000 mL | Freq: Once | INTRAVENOUS | Status: AC | PRN
  Administered 2022-08-11: 75 mL via INTRAVENOUS

## 2022-08-11 MED ORDER — APIXABAN (ELIQUIS) VTE STARTER PACK (10MG AND 5MG): ORAL_TABLET | ORAL | 0 refills | Status: DC

## 2022-08-11 MED ORDER — DILTIAZEM LOAD VIA INFUSION
10.0000 mg | Freq: Once | INTRAVENOUS | Status: AC
  Administered 2022-08-11: 10 mg via INTRAVENOUS
  Filled 2022-08-11: qty 10

## 2022-08-11 NOTE — Discharge Instructions (Addendum)

## 2022-08-11 NOTE — ED Triage Notes (Signed)
Pt states she has had pain for the past week all over her body. Today she specifically has pain under her left breast. Pt states when she takes a deep breath it hurts worse.Pt has had a cough and a low grade fever per her daughter. Pt last took tylenol around noon.

## 2022-08-11 NOTE — ED Provider Notes (Incomplete)
[facility] EMERGENCY DEPT Provider Note   CSN: [id number] Arrival date & time: 08/11/22  1246     History {Add pertinent medical, surgical, social history, OB history to HPI:1} Chief Complaint  Patient presents with   Chest Pain    Carrie Barnes is a 87 y.o. female with past medical history significant for prior heart attack, CAD, hyperlipidemia, hypertension who presents with concern for generalized bodyaches, with some pain under her left breast.  Patient reports that she is having increased shortness of breath with exertion, and is difficult to take a deep breath, reports of pleuritic chest pain.  She denies any worsening of chest pain with exertion.  She denies any nausea, vomiting, does report some decreased appetite. She reports that she has been trying to drink plenty of fluids but may be could be drinking more.  She denies any diarrhea, dysuria   Chest Pain Associated symptoms: shortness of breath        Home Medications Prior to Admission medications   Medication Sig Start Date End Date Taking? Authorizing Provider  acetaminophen (TYLENOL) 500 MG tablet Take 500-1,000 mg by mouth every 8 (eight) hours as needed for moderate pain or headache.    [provider]  apixaban (ELIQUIS) 2.5 MG TABS tablet Take 1 tablet (2.5 mg total) by mouth 2 (two) times daily. 05/19/22   Joseph Art, DO  ascorbic acid (VITAMIN C) 500 MG tablet Take 500 mg by mouth daily.    [provider]  atorvastatin (LIPITOR) 80 MG tablet Take 1 tablet (80 mg total) by mouth daily. 11/05/21   Kathleene Hazel, MD  bisacodyl (DULCOLAX) 10 MG suppository Place 1 suppository (10 mg total) rectally daily as needed for moderate constipation. 05/19/22   Joseph Art, DO  Calcium Carb-Cholecalciferol (CALCIUM 600/VITAMIN D PO) Take 1 tablet by mouth in the morning and at bedtime.    [provider]  cetirizine (ZYRTEC) 10 MG tablet Take 10 mg by mouth daily.     [provider]  Cholecalciferol (VITAMIN D3) 2000 units capsule Take 2,000 Units by mouth daily.    [provider]  CRANBERRY EXTRACT PO Take 25,000 mg by mouth daily.    [provider]  docusate sodium (COLACE) 100 MG capsule Take 1 capsule (100 mg total) by mouth 2 (two) times daily as needed for mild constipation. 09/20/21   Jene Every, MD  feeding supplement (ENSURE ENLIVE / ENSURE PLUS) LIQD Take 237 mLs by mouth 2 (two) times daily between meals. 05/19/22   Joseph Art, DO  latanoprost (XALATAN) 0.005 % ophthalmic solution Place 1 drop into both eyes at bedtime. 08/12/21   [provider]  metoprolol succinate (TOPROL-XL) 25 MG 24 hr tablet Take 0.5 tablets (12.5 mg total) by mouth daily. 05/19/22   Joseph Art, DO  Multiple Vitamin (MULTI-VITAMINS) TABS Take 1 tablet by mouth daily.    [provider]  omeprazole (PRILOSEC) 20 MG capsule Take 20 mg by mouth daily.    [provider]  oxyCODONE (OXY IR/ROXICODONE) 5 MG immediate release tablet Take 1-2 tablets (5-10 mg total) by mouth every 4 (four) hours as needed for moderate pain or severe pain (5 mg moderate pain, 10 mg severe pain). 05/21/22   Leatha Gilding, MD  Polyethyl Glycol-Propyl Glycol (SYSTANE OP) Place 1 drop into both eyes daily as needed (dry eyes).    [provider]  polyethylene glycol (MIRALAX / GLYCOLAX) 17 g packet Take 17  g by mouth daily. 05/19/22   Geradine Girt, DO  Probiotic Product (TRUNATURE DIGESTIVE PROBIOTIC) CAPS Take 1 capsule by mouth daily.    [provider]  senna (SENOKOT) 8.6 MG TABS tablet Take 1 tablet (8.6 mg total) by mouth 2 (two) times daily. 05/19/22   Geradine Girt, DO  timolol (TIMOPTIC) 0.5 % ophthalmic solution Place 1 drop into the left eye 2 (two) times daily. 07/25/21   [provider]  traZODone (DESYREL) 50 MG tablet Take 50 mg by mouth at bedtime as needed for sleep.    [provider]   Trospium Chloride 60 MG CP24 Take 1 capsule by mouth daily. 04/08/22   [provider]      Allergies    Patient has no known allergies.    Review of Systems   Review of Systems  Respiratory:  Positive for shortness of breath.   Cardiovascular:  Positive for chest pain.  All other systems reviewed and are negative.   Physical Exam Updated Vital Signs BP 122/81 (BP Location: Left Arm)   Pulse (!) 102   Temp 98.2 F (36.8 C)   Resp 18   Ht 5\' 2"  (1.575 m)   Wt 67.1 kg   SpO2 97%   BMI 27.07 kg/m  Physical Exam Vitals and nursing note reviewed.  Constitutional:      General: She is not in acute distress.    Appearance: Normal appearance.  HENT:     Head: Normocephalic and atraumatic.  Eyes:     General:        Right eye: No discharge.        Left eye: No discharge.  Cardiovascular:     Rate and Rhythm: Normal rate and regular rhythm.     Heart sounds: No murmur heard.    No friction rub. No gallop.  Pulmonary:     Effort: Pulmonary effort is normal.     Breath sounds: Normal breath sounds.     Comments: Patient with some focal consolidation noted in left lower lung field with some crackles.  No wheezing, rhonchi, stridor, rales throughout. Abdominal:     General: Bowel sounds are normal.     Palpations: Abdomen is soft.  Skin:    General: Skin is warm and dry.     Capillary Refill: Capillary refill takes less than 2 seconds.  Neurological:     Mental Status: She is alert and oriented to person, place, and time.  Psychiatric:        Mood and Affect: Mood normal.        Behavior: Behavior normal.     ED Results / Procedures / Treatments   Labs (all labs ordered are listed, but only abnormal results are displayed) Labs Reviewed  BASIC METABOLIC PANEL - Abnormal; Notable for the following components:      Result Value   Sodium 133 (*)    Chloride 93 (*)    Glucose, Bld 129 (*)    All other components within normal limits  CBC - Abnormal; Notable  for the following components:   RBC 3.65 (*)    Hemoglobin 10.8 (*)    HCT 32.2 (*)    All other components within normal limits  RESP PANEL BY RT-PCR (RSV, FLU A&B, COVID)  RVPGX2  TROPONIN I (HIGH SENSITIVITY)    EKG EKG Interpretation  Date/Time:  Monday August 11 2022 13:09:46 EST Ventricular Rate:  98 PR Interval:  140 QRS Duration: 80 QT Interval:  340 QTC Calculation: 434 R Axis:   19 Text Interpretation: Normal sinus rhythm Normal ECG When compared with ECG of 13-May-2022 06:53, No significant change was found when compared to prior, similar appearance. No STEMI Confirmed by Antony Blackbird [id number]) on 08/11/2022 1:56:57 PM  Radiology DG Chest 2 View  Result Date: 08/11/2022 CLINICAL DATA:  Chest pain. EXAM: CHEST - 2 VIEW COMPARISON:  AP chest 05/12/2022 FINDINGS: Cardiac silhouette and mediastinal contours are within limits. Mild calcification within the aortic arch. Mild chronic bilateral mid and lower lung interstitial thickening is unchanged. There is minimal left and posterior costophrenic angle blunting indicating a small left pleural effusion. Very subtle overlying left costophrenic angle curvilinear density on frontal view left significant airspace opacity seen on lateral view. No pneumothorax. Moderate multilevel degenerative disc changes of the thoracic spine. Status post reverse total right shoulder arthroplasty. IMPRESSION: Small left pleural effusion. Very subtle curvilinear density overlying the left costophrenic angle may represent atelectasis versus early pneumonia. Electronically Signed   By: Yvonne Kendall M.D.   On: 08/11/2022 13:33    Procedures Procedures  {Document cardiac monitor, telemetry assessment procedure when appropriate:1}  Medications Ordered in ED Medications  sodium chloride 0.9 % bolus 1,000 mL (has no administration in time range)  cefTRIAXone (ROCEPHIN) 1 g in sodium chloride 0.9 % 100 mL IVPB (has no administration in time range)    ED  Course/ Medical Decision Making/ A&P                           Medical Decision Making Amount and/or Complexity of Data Reviewed Labs: ordered. Radiology: ordered.   ***  {Document critical care time when appropriate:1} {Document review of labs and clinical decision tools ie heart score, Chads2Vasc2 etc:1}  {Document your independent review of radiology images, and any outside records:1} {Document your discussion with family members, caretakers, and with consultants:1} {Document social determinants of health affecting pt's care:1} {Document your decision making why or why not admission, treatments were needed:1} Final Clinical Impression(s) / ED Diagnoses Final diagnoses:  None    Rx / DC Orders ED Discharge Orders     None

## 2022-08-11 NOTE — Progress Notes (Signed)
ANTICOAGULATION CONSULT NOTE - Initial Consult  Pharmacy Consult for Heparin Indication: atrial fibrillation  No Known Allergies  Patient Measurements: Height: 5\' 2"  (157.5 cm) Weight: 67.1 kg (148 lb) IBW/kg (Calculated) : 50.1 Heparin Dosing Weight: 64 kg  Vital Signs: Temp: 98.1 F (36.7 C) (01/01 1730) Temp Source: Oral (01/01 1730) BP: 103/69 (01/01 1915) Pulse Rate: 94 (01/01 1915)  Labs: Recent Labs    08/11/22 1313 08/11/22 1530 08/11/22 1834  HGB 10.8*  --   --   HCT 32.2*  --   --   PLT 250  --   --   CREATININE 0.63  --   --   TROPONINIHS 5 5 7     Estimated Creatinine Clearance: 47 mL/min (by C-G formula based on SCr of 0.63 mg/dL).   Medical History: Past Medical History:  Diagnosis Date   Cholecystitis    Coronary artery disease    a. 03/2017: 80-90% LAD stenosis (PCI/DES placement with a 2.75x16 mm Promus Premier stent). No significant stenosis along RCA or LCx.    DJD (degenerative joint disease)    GERD (gastroesophageal reflux disease)    Hyperlipidemia    Hypertension    dx s/p MI    Ischemic cardiomyopathy    Myocardial infarction [facility]) 03/17/2017   03-2017 treated at [facility]    Osteoporosis    Status post insertion of drug-eluting stent into left anterior descending (LAD) artery 03/2017    Medications:  Awaiting home med rec  Assessment: 87 y.o F presents with pain. Found to be in afib. To begin heparin for afib. Pt has been on apixaban in the past but not since Oct.  Goal of Therapy:  Heparin level 0.3-0.7 units/ml Monitor platelets by anticoagulation protocol: Yes   Plan:  Heparin IV bolus 3500 units Heparin gtt at 950 units/hr Will f/u heparin level in 8 hours Daily heparin level and CBC   Sherlon Handing, PharmD, BCPS Please see amion for complete clinical pharmacist phone list 08/11/2022,8:38 PM

## 2022-08-12 ENCOUNTER — Encounter: Payer: Self-pay | Admitting: Cardiovascular Disease

## 2022-08-12 ENCOUNTER — Telehealth: Payer: Self-pay | Admitting: Cardiovascular Disease

## 2022-08-12 ENCOUNTER — Telehealth: Payer: Self-pay

## 2022-08-12 ENCOUNTER — Telehealth (HOSPITAL_BASED_OUTPATIENT_CLINIC_OR_DEPARTMENT_OTHER): Payer: Self-pay | Admitting: Emergency Medicine

## 2022-08-12 MED ORDER — APIXABAN 5 MG PO TABS: 5.0000 mg | ORAL_TABLET | Freq: Two times a day (BID) | ORAL | 1 refills | Status: DC

## 2022-08-12 NOTE — Telephone Encounter (Signed)
Pt is requesting call back to discuss hosp f/u and referral put in by the hospital for afib clinic. She would also like to discuss medication change.

## 2022-08-12 NOTE — Telephone Encounter (Signed)
Also advised patient to go to ER if she becomes SOB or if she feels chest pain/palpitations, especially if combined with SOB. Patient verbalizes understanding.

## 2022-08-12 NOTE — Telephone Encounter (Signed)
Patient was prescribed the wrong Eliquis and needs to only be on 5 mg twice daily.  A new prescription was sent.

## 2022-08-12 NOTE — Telephone Encounter (Signed)
Patient reporting she was in ED and was given antibiotic:   "At ED yesterday I was given an antibiotic for pneumonia and told I would continue taking antibiotic at home. After AFIB diagnosis, there was no further mention of antibiotic. They said I may have walking pneumonia. I am wondering if I need to continue antibiotic. I still have body aches and low grade fever today."    Reviewed Discharge summary, patient was given rocephin after CXR showed effusion vs pneumonia. After CT, it seemed more likely to be effusion. Patient was also treated for new onset afib and discharged. Patient reporting she has new cardizem and eliquis orders she will pick up today, but is wondering if her antibiotic should be reordered as she still has cough, fatigue and low grade fever. Explained may be from afib, but if she is concerned for URI or pneumonia symptoms she should reach out to her PCP as she may need a follow up xray. Education on how cough can worsen afib. Patient verbalizes understanding, has follow up visit w/ Dr. Glennie Hawk on 08/18/22 and also has contact info for afib clinic. Advised patient to call and review treatment plan with afib clinic as well. Patient agreed.

## 2022-08-13 DIAGNOSIS — D649 Anemia, unspecified: Secondary | ICD-10-CM | POA: Diagnosis not present

## 2022-08-13 DIAGNOSIS — E785 Hyperlipidemia, unspecified: Secondary | ICD-10-CM | POA: Diagnosis not present

## 2022-08-13 DIAGNOSIS — I2510 Atherosclerotic heart disease of native coronary artery without angina pectoris: Secondary | ICD-10-CM | POA: Diagnosis not present

## 2022-08-13 DIAGNOSIS — I5022 Chronic systolic (congestive) heart failure: Secondary | ICD-10-CM | POA: Diagnosis not present

## 2022-08-13 DIAGNOSIS — M810 Age-related osteoporosis without current pathological fracture: Secondary | ICD-10-CM | POA: Diagnosis not present

## 2022-08-13 DIAGNOSIS — R339 Retention of urine, unspecified: Secondary | ICD-10-CM | POA: Diagnosis not present

## 2022-08-13 DIAGNOSIS — S32401D Unspecified fracture of right acetabulum, subsequent encounter for fracture with routine healing: Secondary | ICD-10-CM | POA: Diagnosis not present

## 2022-08-13 DIAGNOSIS — Z955 Presence of coronary angioplasty implant and graft: Secondary | ICD-10-CM | POA: Diagnosis not present

## 2022-08-13 DIAGNOSIS — K219 Gastro-esophageal reflux disease without esophagitis: Secondary | ICD-10-CM | POA: Diagnosis not present

## 2022-08-13 DIAGNOSIS — Z7982 Long term (current) use of aspirin: Secondary | ICD-10-CM | POA: Diagnosis not present

## 2022-08-13 DIAGNOSIS — Z96611 Presence of right artificial shoulder joint: Secondary | ICD-10-CM | POA: Diagnosis not present

## 2022-08-13 DIAGNOSIS — M1990 Unspecified osteoarthritis, unspecified site: Secondary | ICD-10-CM | POA: Diagnosis not present

## 2022-08-13 DIAGNOSIS — I252 Old myocardial infarction: Secondary | ICD-10-CM | POA: Diagnosis not present

## 2022-08-13 DIAGNOSIS — S42201D Unspecified fracture of upper end of right humerus, subsequent encounter for fracture with routine healing: Secondary | ICD-10-CM | POA: Diagnosis not present

## 2022-08-13 DIAGNOSIS — I429 Cardiomyopathy, unspecified: Secondary | ICD-10-CM | POA: Diagnosis not present

## 2022-08-13 DIAGNOSIS — Z9181 History of falling: Secondary | ICD-10-CM | POA: Diagnosis not present

## 2022-08-13 DIAGNOSIS — S3282XD Multiple fractures of pelvis without disruption of pelvic ring, subsequent encounter for fracture with routine healing: Secondary | ICD-10-CM | POA: Diagnosis not present

## 2022-08-13 DIAGNOSIS — I110 Hypertensive heart disease with heart failure: Secondary | ICD-10-CM | POA: Diagnosis not present

## 2022-08-15 DIAGNOSIS — E041 Nontoxic single thyroid nodule: Secondary | ICD-10-CM | POA: Diagnosis not present

## 2022-08-15 DIAGNOSIS — I480 Paroxysmal atrial fibrillation: Secondary | ICD-10-CM | POA: Diagnosis not present

## 2022-08-17 NOTE — Progress Notes (Unsigned)
No chief complaint on file.   History of Present Illness: 87 yo female with history of CAD, ischemic cardiomyopathy, HTN, HLD, PACs and spinal stenosis here today for cardiac follow up. She was admitted to [facility] in August 2018 with an MI and had a drug eluting stent placed in the LAD. LVEF at the time of her MI was 35-40% but improved to 55-60% on echo here in our office September 2018. Cardiac monitor July 2020 with sinus and PACs. Echo December 2020 with LVEF=60-65%, normal RV function. No valve disease. She has been seen in our HTN clinic. HCTZ added at ED visit on 05/16/21 for hypertensive urgency. ED visit on 09/27/21 with BP 184/79. She then had low blood pressure so she stopped the HCTZ and irbesartan. She has been taking Cardizem and Toprol. Echo October 2023 with LVEF=65-70%. No valve disease. She was seen in the ED 08/12/22 with c/o weakness. She was found to have a pneumonia and was given antibiotics. While there she had atrial fib with RVR but converted to sinus on Cardizem drip. She was started on Eliquis and po Cardizem.   She is here today for follow up. The patient denies any chest pain, dyspnea, palpitations, lower extremity edema, orthopnea, PND, dizziness, near syncope or syncope.   Primary Care Physician: Renford Dills, MD  Past Medical History:  Diagnosis Date   Cholecystitis    Coronary artery disease    a. 03/2017: 80-90% LAD stenosis (PCI/DES placement with a 2.75x16 mm Promus Premier stent). No significant stenosis along RCA or LCx.    DJD (degenerative joint disease)    GERD (gastroesophageal reflux disease)    Hyperlipidemia    Hypertension    dx s/p MI    Ischemic cardiomyopathy    Myocardial infarction (HCC) 03/17/2017   03-2017 treated at [facility]    Osteoporosis    Status post insertion of drug-eluting stent into left anterior descending (LAD) artery 03/2017    Past Surgical History:  Procedure Laterality Date    APPENDECTOMY     CESAREAN SECTION     CHOLECYSTECTOMY N/A 11/05/2017   Procedure: LAPAROSCOPIC CHOLECYSTECTOMY WITH INTRAOPERATIVE CHOLANGIOGRAM;  Surgeon: Darnell Level, MD;  Location: [facility];  Service: General;  Laterality: N/A;   ERCP N/A 11/11/2017   Procedure: ENDOSCOPIC RETROGRADE CHOLANGIOPANCREATOGRAPHY (ERCP);  Surgeon: Willis Modena, MD;  Location: [facility];  Service: Endoscopy;  Laterality: N/A;  possible   EUS N/A 11/11/2017   Procedure: UPPER ENDOSCOPIC ULTRASOUND (EUS) RADIAL;  Surgeon: Willis Modena, MD;  Location: [facility];  Service: Endoscopy;  Laterality: N/A;   FEMUR IM NAIL Right 07/13/2019   Procedure: INTRAMEDULLARY (IM) RETROGRADE FEMORAL NAILING;  Surgeon: Ollen Gross, MD;  Location: [facility];  Service: Orthopedics;  Laterality: Right;    LUMBAR LAMINECTOMY/ DECOMPRESSION WITH MET-RX     OPEN REDUCTION INTERNAL FIXATION ACETABULAR FRACTURE STOPPA Right 05/14/2022   Procedure: OPEN REDUCTION INTERNAL FIXATION RIGHT ACETABULUM;  Surgeon: Roby Lofts, MD;  Location: [facility] OR;  Service: Orthopedics;  Laterality: Right;   REVERSE SHOULDER ARTHROPLASTY Right 05/15/2022   Procedure: RIGHT REVERSE SHOULDER ARTHROPLASTY;  Surgeon: Yolonda Kida, MD;  Location[facility] OR;  Service: Orthopedics;  Laterality: Right;   SHOULDER ARTHROSCOPY WITH ROTATOR CUFF REPAIR AND SUBACROMIAL DECOMPRESSION Right 09/20/2021   Procedure: SHOULDER MINI OPEN ROTATOR CUFF REPAIR AND SUBACROMIAL DECOMPRESSION WITH POSSIBLE PATCH GRAFT;  Surgeon: Jene Every, MD;  Location: [facility];  Service: Orthopedics;  Laterality: Right;  90 MINS  stent  Left 03/2017   anterior descending ; drug eluting ; tx of MI at [facility]    TONSILLECTOMY     VAGINAL HYSTERECTOMY     vaginal sling      Current Outpatient Medications  Medication Sig Dispense Refill   acetaminophen (TYLENOL) 500 MG tablet Take 500-1,000 mg by mouth every 8 (eight) hours as needed for moderate pain or  headache.     apixaban (ELIQUIS) 5 MG TABS tablet Take 1 tablet (5 mg total) by mouth 2 (two) times daily. 60 tablet 1   ascorbic acid (VITAMIN C) 500 MG tablet Take 500 mg by mouth daily.     atorvastatin (LIPITOR) 80 MG tablet Take 1 tablet (80 mg total) by mouth daily. 90 tablet 3   bisacodyl (DULCOLAX) 10 MG suppository Place 1 suppository (10 mg total) rectally daily as needed for moderate constipation. 12 suppository 0   Calcium Carb-Cholecalciferol (CALCIUM 600/VITAMIN D PO) Take 1 tablet by mouth in the morning and at bedtime.     cetirizine (ZYRTEC) 10 MG tablet Take 10 mg by mouth daily.     Cholecalciferol (VITAMIN D3) 2000 units capsule Take 2,000 Units by mouth daily.     CRANBERRY EXTRACT PO Take 25,000 mg by mouth daily.     diltiazem (CARDIZEM SR) 60 MG 12 hr capsule Take 1 capsule (60 mg total) by mouth 2 (two) times daily. 30 capsule 0   docusate sodium (COLACE) 100 MG capsule Take 1 capsule (100 mg total) by mouth 2 (two) times daily as needed for mild constipation. 30 capsule 1   feeding supplement (ENSURE ENLIVE / ENSURE PLUS) LIQD Take 237 mLs by mouth 2 (two) times daily between meals. 237 mL 12   latanoprost (XALATAN) 0.005 % ophthalmic solution Place 1 drop into both eyes at bedtime.     metoprolol succinate (TOPROL-XL) 25 MG 24 hr tablet Take 0.5 tablets (12.5 mg total) by mouth daily.     Multiple Vitamin (MULTI-VITAMINS) TABS Take 1 tablet by mouth daily.     omeprazole (PRILOSEC) 20 MG capsule Take 20 mg by mouth daily.     oxyCODONE (OXY IR/ROXICODONE) 5 MG immediate release tablet Take 1-2 tablets (5-10 mg total) by mouth every 4 (four) hours as needed for moderate pain or severe pain (5 mg moderate pain, 10 mg severe pain). 8 tablet 0   Polyethyl Glycol-Propyl Glycol (SYSTANE OP) Place 1 drop into both eyes daily as needed (dry eyes).     polyethylene glycol (MIRALAX / GLYCOLAX) 17 g packet Take 17 g by mouth daily. 14 each 0   Probiotic Product (TRUNATURE DIGESTIVE  PROBIOTIC) CAPS Take 1 capsule by mouth daily.     senna (SENOKOT) 8.6 MG TABS tablet Take 1 tablet (8.6 mg total) by mouth 2 (two) times daily. 120 tablet 0   timolol (TIMOPTIC) 0.5 % ophthalmic solution Place 1 drop into the left eye 2 (two) times daily.     traZODone (DESYREL) 50 MG tablet Take 50 mg by mouth at bedtime as needed for sleep.     Trospium Chloride 60 MG CP24 Take 1 capsule by mouth daily.     No current facility-administered medications for this visit.   Facility-Administered Medications Ordered in Other Visits  Medication Dose Route Frequency Provider Last Rate Last Admin   indomethacin (INDOCIN) 50 MG suppository 50 mg  50 mg Rectal Once Willis Modena, MD        No Known Allergies  Social History  Socioeconomic History   Marital status: Widowed    Spouse name: Not on file   Number of children: 4   Years of education: Not on file   Highest education level: Not on file  Occupational History   Occupation: Homemaker   Occupation: [employer]   Tobacco Use   Smoking status: Former    Packs/day: 1.00    Years: 30.00    Total pack years: 30.00    Types: Cigarettes   Smokeless tobacco: Never   Tobacco comments:    quit 30 years   Vaping Use   Vaping Use: Never used  Substance and Sexual Activity   Alcohol use: Yes    Comment: 3 times a year   Drug use: Never   Sexual activity: Not Currently  Other Topics Concern   Not on file  Social History Narrative   Not on file   Social Determinants of Health   Financial Resource Strain: Not on file  Food Insecurity: No Food Insecurity (05/12/2022)   Hunger Vital Sign    Worried About Running Out of Food in the Last Year: Never true    Ran Out of Food in the Last Year: Never true  Transportation Needs: No Transportation Needs (05/12/2022)   PRAPARE - Administrator, Civil Service (Medical): No    Lack of Transportation (Non-Medical): No  Physical Activity: Not on file  Stress:  Not on file  Social Connections: Not on file  Intimate Partner Violence: Not At Risk (05/12/2022)   Humiliation, Afraid, Rape, and Kick questionnaire    Fear of Current or Ex-Partner: No    Emotionally Abused: No    Physically Abused: No    Sexually Abused: No    Family History  Problem Relation Age of Onset   Heart disease Father    Breast cancer Sister    Pancreatic cancer Sister     Review of Systems:  As stated in the HPI and otherwise negative.   There were no vitals taken for this visit.  Physical Examination:  General: Well developed, well nourished, NAD  HEENT: OP clear, mucus membranes moist  SKIN: warm, dry. No rashes. Neuro: No focal deficits  Musculoskeletal: Muscle strength 5/5 all ext  Psychiatric: Mood and affect normal  Neck: No JVD, no carotid bruits, no thyromegaly, no lymphadenopathy.  Lungs:Clear bilaterally, no wheezes, rhonci, crackles Cardiovascular: Regular rate and rhythm. No murmurs, gallops or rubs. Abdomen:Soft. Bowel sounds present. Non-tender.  Extremities: No lower extremity edema. Pulses are 2 + in the bilateral DP/PT.  EKG:  EKG is *** ordered today. The ekg ordered today demonstrates   Echo October 2023:  1. Left ventricular ejection fraction, by estimation, is 65 to 70%. The  left ventricle has normal function. The left ventricle has no regional  wall motion abnormalities. Left ventricular diastolic parameters are  consistent with Grade I diastolic  dysfunction (impaired relaxation).   2. Right ventricular systolic function is normal. The right ventricular  size is normal.   3. The mitral valve is normal in structure. No evidence of mitral valve  regurgitation.   4. The aortic valve is tricuspid. There is mild calcification of the  aortic valve. There is mild thickening of the aortic valve. Aortic valve  regurgitation is not visualized. Aortic valve sclerosis is present, with  no evidence of aortic valve stenosis.   Recent  Labs: 05/12/2022: ALT 19; TSH 2.501 05/14/2022: Magnesium 1.8 08/11/2022: B Natriuretic Peptide 89.3; BUN 16; Creatinine, Ser 0.63;  Hemoglobin 10.8; Platelets 250; Potassium 4.5; Sodium 133   Lipid Panel    Component Value Date/Time   CHOL 120 07/09/2017 0850   TRIG 207 (H) 07/09/2017 0850   HDL 42 07/09/2017 0850   CHOLHDL 2.9 07/09/2017 0850   LDLCALC 37 07/09/2017 0850     Wt Readings from Last 3 Encounters:  08/11/22 67.1 kg  05/12/22 70 kg  12/25/21 70.6 kg    Assessment and Plan:   1. CAD without angina: She had a drug eluting stent placed in the LAD in August 2018. No chest pain suggestive of angina. Continue ASA, beta blocker and statin.       2. Ischemic cardiomyopathy: Normal LV systolic function by echo October 2023.   3. Hyperlipidemia: Lipids followed in primary care. LDL 64 March 2023. Continue statin.       4. HTN: BP is controlled. No changes   5. Atrial fibrillation, paroxysmal: Sinus today. Continue Cardizem and Eliquis.   Labs/ tests ordered today include:  No orders of the defined types were placed in this encounter.  Disposition:   F/U with me in 12 months  Signed, Lauree Chandler, MD 08/17/2022 3:46 PM    [facility] [place], [place], [place]  [address] Phone: [phone]; Fax: [phone]

## 2022-08-18 ENCOUNTER — Encounter: Payer: Self-pay | Admitting: Cardiovascular Disease

## 2022-08-18 ENCOUNTER — Ambulatory Visit: Payer: Medicare Other | Attending: Cardiovascular Disease | Admitting: Cardiovascular Disease

## 2022-08-18 VITALS — BP 130/82 | HR 101 | Ht 62.0 in | Wt 152.2 lb

## 2022-08-18 DIAGNOSIS — E785 Hyperlipidemia, unspecified: Secondary | ICD-10-CM | POA: Diagnosis not present

## 2022-08-18 DIAGNOSIS — I2510 Atherosclerotic heart disease of native coronary artery without angina pectoris: Secondary | ICD-10-CM

## 2022-08-18 DIAGNOSIS — I10 Essential (primary) hypertension: Secondary | ICD-10-CM

## 2022-08-18 DIAGNOSIS — I480 Paroxysmal atrial fibrillation: Secondary | ICD-10-CM

## 2022-08-18 DIAGNOSIS — I255 Ischemic cardiomyopathy: Secondary | ICD-10-CM

## 2022-08-18 MED ORDER — DILTIAZEM HCL ER COATED BEADS 120 MG PO CP24: 120.0000 mg | ORAL_CAPSULE | Freq: Every day | ORAL | 3 refills | Status: DC

## 2022-08-18 NOTE — Patient Instructions (Signed)
Medication Instructions:  Start Eliquis 5 mg - one tablet twice a day Start Cardizem CD (diltiazem) 120 mg - one tablet daily  *If you need a refill on your cardiac medications before your next appointment, please call your pharmacy*   Lab Work: none  Testing/Procedures: none  Follow-Up: At [facility], you and your health needs are our priority.  As part of our continuing mission to provide you with exceptional heart care, we have created designated Provider Care Teams.  These Care Teams include your primary Cardiologist (physician) and Advanced Practice Providers (APPs -  Physician Assistants and Nurse Practitioners) who all work together to provide you with the care you need, when you need it.   Your next appointment:   2-3 month(s)  The format for your next appointment:   In Person  Provider:   Lauree Chandler, MD      Important Information About Sugar

## 2022-08-19 DIAGNOSIS — S3282XD Multiple fractures of pelvis without disruption of pelvic ring, subsequent encounter for fracture with routine healing: Secondary | ICD-10-CM | POA: Diagnosis not present

## 2022-08-19 DIAGNOSIS — S32401D Unspecified fracture of right acetabulum, subsequent encounter for fracture with routine healing: Secondary | ICD-10-CM | POA: Diagnosis not present

## 2022-08-19 DIAGNOSIS — S42201D Unspecified fracture of upper end of right humerus, subsequent encounter for fracture with routine healing: Secondary | ICD-10-CM | POA: Diagnosis not present

## 2022-08-19 DIAGNOSIS — M810 Age-related osteoporosis without current pathological fracture: Secondary | ICD-10-CM | POA: Diagnosis not present

## 2022-08-19 DIAGNOSIS — Z96611 Presence of right artificial shoulder joint: Secondary | ICD-10-CM | POA: Diagnosis not present

## 2022-08-19 DIAGNOSIS — I110 Hypertensive heart disease with heart failure: Secondary | ICD-10-CM | POA: Diagnosis not present

## 2022-08-19 DIAGNOSIS — I2510 Atherosclerotic heart disease of native coronary artery without angina pectoris: Secondary | ICD-10-CM | POA: Diagnosis not present

## 2022-08-19 DIAGNOSIS — M1990 Unspecified osteoarthritis, unspecified site: Secondary | ICD-10-CM | POA: Diagnosis not present

## 2022-08-19 DIAGNOSIS — I429 Cardiomyopathy, unspecified: Secondary | ICD-10-CM | POA: Diagnosis not present

## 2022-08-19 DIAGNOSIS — I252 Old myocardial infarction: Secondary | ICD-10-CM | POA: Diagnosis not present

## 2022-08-19 DIAGNOSIS — D649 Anemia, unspecified: Secondary | ICD-10-CM | POA: Diagnosis not present

## 2022-08-19 DIAGNOSIS — E785 Hyperlipidemia, unspecified: Secondary | ICD-10-CM | POA: Diagnosis not present

## 2022-08-19 DIAGNOSIS — Z471 Aftercare following joint replacement surgery: Secondary | ICD-10-CM | POA: Diagnosis not present

## 2022-08-19 DIAGNOSIS — K219 Gastro-esophageal reflux disease without esophagitis: Secondary | ICD-10-CM | POA: Diagnosis not present

## 2022-08-19 DIAGNOSIS — Z7982 Long term (current) use of aspirin: Secondary | ICD-10-CM | POA: Diagnosis not present

## 2022-08-19 DIAGNOSIS — R339 Retention of urine, unspecified: Secondary | ICD-10-CM | POA: Diagnosis not present

## 2022-08-19 DIAGNOSIS — I5022 Chronic systolic (congestive) heart failure: Secondary | ICD-10-CM | POA: Diagnosis not present

## 2022-08-20 ENCOUNTER — Emergency Department (HOSPITAL_COMMUNITY): Payer: Medicare Other

## 2022-08-20 ENCOUNTER — Other Ambulatory Visit: Payer: Self-pay

## 2022-08-20 ENCOUNTER — Inpatient Hospital Stay (HOSPITAL_COMMUNITY)
Admission: EM | Admit: 2022-08-20 | Discharge: 2022-08-25 | DRG: 481 | Disposition: A | Discharge status: Skilled Nursing Facility | Payer: Medicare Other | Attending: Internal Medicine | Admitting: Internal Medicine

## 2022-08-20 ENCOUNTER — Encounter (HOSPITAL_COMMUNITY): Payer: Self-pay

## 2022-08-20 DIAGNOSIS — M1990 Unspecified osteoarthritis, unspecified site: Secondary | ICD-10-CM | POA: Diagnosis not present

## 2022-08-20 DIAGNOSIS — Z87891 Personal history of nicotine dependence: Secondary | ICD-10-CM

## 2022-08-20 DIAGNOSIS — I255 Ischemic cardiomyopathy: Secondary | ICD-10-CM | POA: Diagnosis present

## 2022-08-20 DIAGNOSIS — W19XXXA Unspecified fall, initial encounter: Secondary | ICD-10-CM | POA: Diagnosis not present

## 2022-08-20 DIAGNOSIS — Z7982 Long term (current) use of aspirin: Secondary | ICD-10-CM

## 2022-08-20 DIAGNOSIS — R41841 Cognitive communication deficit: Secondary | ICD-10-CM | POA: Diagnosis not present

## 2022-08-20 DIAGNOSIS — Z7401 Bed confinement status: Secondary | ICD-10-CM | POA: Diagnosis not present

## 2022-08-20 DIAGNOSIS — K8010 Calculus of gallbladder with chronic cholecystitis without obstruction: Secondary | ICD-10-CM | POA: Diagnosis present

## 2022-08-20 DIAGNOSIS — Z955 Presence of coronary angioplasty implant and graft: Secondary | ICD-10-CM

## 2022-08-20 DIAGNOSIS — Z803 Family history of malignant neoplasm of breast: Secondary | ICD-10-CM

## 2022-08-20 DIAGNOSIS — S72423A Displaced fracture of lateral condyle of unspecified femur, initial encounter for closed fracture: Secondary | ICD-10-CM | POA: Diagnosis present

## 2022-08-20 DIAGNOSIS — S80919A Unspecified superficial injury of unspecified knee, initial encounter: Secondary | ICD-10-CM | POA: Diagnosis not present

## 2022-08-20 DIAGNOSIS — R339 Retention of urine, unspecified: Secondary | ICD-10-CM | POA: Diagnosis not present

## 2022-08-20 DIAGNOSIS — I10 Essential (primary) hypertension: Secondary | ICD-10-CM | POA: Diagnosis present

## 2022-08-20 DIAGNOSIS — M1611 Unilateral primary osteoarthritis, right hip: Secondary | ICD-10-CM | POA: Diagnosis not present

## 2022-08-20 DIAGNOSIS — R609 Edema, unspecified: Secondary | ICD-10-CM | POA: Diagnosis not present

## 2022-08-20 DIAGNOSIS — Z9181 History of falling: Secondary | ICD-10-CM | POA: Diagnosis not present

## 2022-08-20 DIAGNOSIS — S72431A Displaced fracture of medial condyle of right femur, initial encounter for closed fracture: Secondary | ICD-10-CM | POA: Diagnosis not present

## 2022-08-20 DIAGNOSIS — Z7901 Long term (current) use of anticoagulants: Secondary | ICD-10-CM

## 2022-08-20 DIAGNOSIS — Z800 Family history of malignant neoplasm of digestive organs: Secondary | ICD-10-CM | POA: Diagnosis not present

## 2022-08-20 DIAGNOSIS — E871 Hypo-osmolality and hyponatremia: Secondary | ICD-10-CM | POA: Diagnosis present

## 2022-08-20 DIAGNOSIS — Z9889 Other specified postprocedural states: Secondary | ICD-10-CM | POA: Diagnosis not present

## 2022-08-20 DIAGNOSIS — D62 Acute posthemorrhagic anemia: Secondary | ICD-10-CM | POA: Diagnosis not present

## 2022-08-20 DIAGNOSIS — S32401D Unspecified fracture of right acetabulum, subsequent encounter for fracture with routine healing: Secondary | ICD-10-CM | POA: Diagnosis not present

## 2022-08-20 DIAGNOSIS — Z96611 Presence of right artificial shoulder joint: Secondary | ICD-10-CM | POA: Diagnosis present

## 2022-08-20 DIAGNOSIS — I2510 Atherosclerotic heart disease of native coronary artery without angina pectoris: Secondary | ICD-10-CM | POA: Diagnosis present

## 2022-08-20 DIAGNOSIS — I429 Cardiomyopathy, unspecified: Secondary | ICD-10-CM | POA: Diagnosis not present

## 2022-08-20 DIAGNOSIS — R000 Tachycardia, unspecified: Secondary | ICD-10-CM | POA: Diagnosis not present

## 2022-08-20 DIAGNOSIS — I4891 Unspecified atrial fibrillation: Secondary | ICD-10-CM | POA: Diagnosis not present

## 2022-08-20 DIAGNOSIS — S72351A Displaced comminuted fracture of shaft of right femur, initial encounter for closed fracture: Secondary | ICD-10-CM | POA: Diagnosis not present

## 2022-08-20 DIAGNOSIS — M810 Age-related osteoporosis without current pathological fracture: Secondary | ICD-10-CM | POA: Diagnosis not present

## 2022-08-20 DIAGNOSIS — R2689 Other abnormalities of gait and mobility: Secondary | ICD-10-CM | POA: Diagnosis not present

## 2022-08-20 DIAGNOSIS — K219 Gastro-esophageal reflux disease without esophagitis: Secondary | ICD-10-CM | POA: Diagnosis present

## 2022-08-20 DIAGNOSIS — Y92019 Unspecified place in single-family (private) house as the place of occurrence of the external cause: Secondary | ICD-10-CM | POA: Diagnosis not present

## 2022-08-20 DIAGNOSIS — S3282XD Multiple fractures of pelvis without disruption of pelvic ring, subsequent encounter for fracture with routine healing: Secondary | ICD-10-CM | POA: Diagnosis not present

## 2022-08-20 DIAGNOSIS — S72401D Unspecified fracture of lower end of right femur, subsequent encounter for closed fracture with routine healing: Secondary | ICD-10-CM | POA: Diagnosis not present

## 2022-08-20 DIAGNOSIS — S72461A Displaced supracondylar fracture with intracondylar extension of lower end of right femur, initial encounter for closed fracture: Principal | ICD-10-CM | POA: Diagnosis present

## 2022-08-20 DIAGNOSIS — S72491A Other fracture of lower end of right femur, initial encounter for closed fracture: Secondary | ICD-10-CM | POA: Diagnosis not present

## 2022-08-20 DIAGNOSIS — Z79899 Other long term (current) drug therapy: Secondary | ICD-10-CM

## 2022-08-20 DIAGNOSIS — S42201D Unspecified fracture of upper end of right humerus, subsequent encounter for fracture with routine healing: Secondary | ICD-10-CM | POA: Diagnosis not present

## 2022-08-20 DIAGNOSIS — E785 Hyperlipidemia, unspecified: Secondary | ICD-10-CM | POA: Diagnosis present

## 2022-08-20 DIAGNOSIS — I252 Old myocardial infarction: Secondary | ICD-10-CM | POA: Diagnosis not present

## 2022-08-20 DIAGNOSIS — D649 Anemia, unspecified: Secondary | ICD-10-CM | POA: Diagnosis not present

## 2022-08-20 DIAGNOSIS — Z8249 Family history of ischemic heart disease and other diseases of the circulatory system: Secondary | ICD-10-CM | POA: Diagnosis not present

## 2022-08-20 DIAGNOSIS — I110 Hypertensive heart disease with heart failure: Secondary | ICD-10-CM | POA: Diagnosis not present

## 2022-08-20 DIAGNOSIS — R2681 Unsteadiness on feet: Secondary | ICD-10-CM | POA: Diagnosis not present

## 2022-08-20 DIAGNOSIS — M6281 Muscle weakness (generalized): Secondary | ICD-10-CM | POA: Diagnosis not present

## 2022-08-20 DIAGNOSIS — R262 Difficulty in walking, not elsewhere classified: Secondary | ICD-10-CM | POA: Diagnosis not present

## 2022-08-20 DIAGNOSIS — S72421A Displaced fracture of lateral condyle of right femur, initial encounter for closed fracture: Secondary | ICD-10-CM

## 2022-08-20 DIAGNOSIS — W010XXA Fall on same level from slipping, tripping and stumbling without subsequent striking against object, initial encounter: Secondary | ICD-10-CM | POA: Diagnosis present

## 2022-08-20 DIAGNOSIS — S72401A Unspecified fracture of lower end of right femur, initial encounter for closed fracture: Principal | ICD-10-CM

## 2022-08-20 DIAGNOSIS — Z472 Encounter for removal of internal fixation device: Secondary | ICD-10-CM | POA: Diagnosis not present

## 2022-08-20 DIAGNOSIS — I5022 Chronic systolic (congestive) heart failure: Secondary | ICD-10-CM | POA: Diagnosis not present

## 2022-08-20 DIAGNOSIS — S79929A Unspecified injury of unspecified thigh, initial encounter: Secondary | ICD-10-CM | POA: Diagnosis not present

## 2022-08-20 DIAGNOSIS — M25561 Pain in right knee: Secondary | ICD-10-CM | POA: Diagnosis not present

## 2022-08-20 LAB — PROTIME-INR
INR: 1.6 — ABNORMAL HIGH (ref 0.8–1.2)
Prothrombin Time: 18.8 seconds — ABNORMAL HIGH (ref 11.4–15.2)

## 2022-08-20 LAB — CBC WITH DIFFERENTIAL/PLATELET
Abs Immature Granulocytes: 0.12 10*3/uL — ABNORMAL HIGH (ref 0.00–0.07)
Basophils Absolute: 0.1 10*3/uL (ref 0.0–0.1)
Basophils Relative: 1 %
Eosinophils Absolute: 0.1 10*3/uL (ref 0.0–0.5)
Eosinophils Relative: 1 %
HCT: 28.1 % — ABNORMAL LOW (ref 36.0–46.0)
Hemoglobin: 9.5 g/dL — ABNORMAL LOW (ref 12.0–15.0)
Immature Granulocytes: 1 %
Lymphocytes Relative: 12 %
Lymphs Abs: 1.3 10*3/uL (ref 0.7–4.0)
MCH: 29.7 pg (ref 26.0–34.0)
MCHC: 33.8 g/dL (ref 30.0–36.0)
MCV: 87.8 fL (ref 80.0–100.0)
Monocytes Absolute: 1.1 10*3/uL — ABNORMAL HIGH (ref 0.1–1.0)
Monocytes Relative: 10 %
Neutro Abs: 8.2 10*3/uL — ABNORMAL HIGH (ref 1.7–7.7)
Neutrophils Relative %: 75 %
Platelets: 284 10*3/uL (ref 150–400)
RBC: 3.2 MIL/uL — ABNORMAL LOW (ref 3.87–5.11)
RDW: 12.7 % (ref 11.5–15.5)
WBC: 10.9 10*3/uL — ABNORMAL HIGH (ref 4.0–10.5)
nRBC: 0 % (ref 0.0–0.2)

## 2022-08-20 LAB — BASIC METABOLIC PANEL
Anion gap: 12 (ref 5–15)
BUN: 15 mg/dL (ref 8–23)
CO2: 25 mmol/L (ref 22–32)
Calcium: 8.8 mg/dL — ABNORMAL LOW (ref 8.9–10.3)
Chloride: 92 mmol/L — ABNORMAL LOW (ref 98–111)
Creatinine, Ser: 0.66 mg/dL (ref 0.44–1.00)
GFR, Estimated: >60 mL/min (ref >60)
Glucose, Bld: 137 mg/dL — ABNORMAL HIGH (ref 70–99)
Potassium: 4.2 mmol/L (ref 3.5–5.1)
Sodium: 129 mmol/L — ABNORMAL LOW (ref 135–145)

## 2022-08-20 MED ORDER — MORPHINE SULFATE (PF) 4 MG/ML IV SOLN
4.0000 mg | Freq: Once | INTRAVENOUS | Status: AC
  Administered 2022-08-20: 4 mg via INTRAVENOUS
  Filled 2022-08-20: qty 1

## 2022-08-20 MED ORDER — HYDROMORPHONE HCL 1 MG/ML IJ SOLN
1.0000 mg | Freq: Once | INTRAMUSCULAR | Status: AC
  Administered 2022-08-20: 1 mg via INTRAVENOUS
  Filled 2022-08-20: qty 1

## 2022-08-20 MED ORDER — LIP MEDEX EX OINT
TOPICAL_OINTMENT | Freq: Once | CUTANEOUS | Status: AC
  Filled 2022-08-20: qty 7

## 2022-08-20 MED ORDER — ONDANSETRON HCL 4 MG PO TABS: 4.0000 mg | ORAL_TABLET | Freq: Four times a day (QID) | ORAL | Status: DC | PRN

## 2022-08-20 MED ORDER — ONDANSETRON HCL 4 MG/2ML IJ SOLN: 4.0000 mg | Freq: Four times a day (QID) | INTRAMUSCULAR | Status: DC | PRN

## 2022-08-20 MED ORDER — ONDANSETRON HCL 4 MG/2ML IJ SOLN
4.0000 mg | Freq: Once | INTRAMUSCULAR | Status: AC
  Administered 2022-08-20: 4 mg via INTRAVENOUS
  Filled 2022-08-20: qty 2

## 2022-08-20 MED ORDER — HYDROMORPHONE HCL 1 MG/ML IJ SOLN
1.0000 mg | INTRAMUSCULAR | Status: DC | PRN
  Administered 2022-08-21: 1 mg via INTRAVENOUS
  Filled 2022-08-20 (×2): qty 1

## 2022-08-20 NOTE — H&P (Signed)
History and Physical    Patient: Carrie Barnes YBO:[id number] DOB: 23-Jun-1936 DOA: 08/20/2022 DOS: the patient was seen and examined on 08/20/2022 PCP: Renford Dills, MD  Patient coming from: Home  Chief Complaint:  Chief Complaint  Patient presents with   Knee Injury   HPI: Carrie Barnes is a 87 y.o. female with medical history significant of GERD, essential hypertension, atrial fibrillation, hyperlipidemia, coronary artery disease, previous hip fractures, osteoporosis, degenerative joint disease who usually walks with a walker but had a mechanical fall fall onto the right knee today.  Patient felt severe pain almost right away.  Came to the ER evaluated and found to have distal femoral fracture.  She is unable to bear weight thereafter.  Pain has persisted.  Patient unfortunately is on Eliquis.  Orthopedics consulted.  Recommended transfer to [facility] where her surgery will be performed.  Patient will need to have a washout prior to the surgery.  She is otherwise having no significant complaint.  She recently had right pubic pubic ramus and acetabular fractures and had ORIF by Dr. Headaches.  And also had shoulder surgery.  Review of Systems: As mentioned in the history of present illness. All other systems reviewed and are negative. Past Medical History:  Diagnosis Date   Cholecystitis    Coronary artery disease    a. 03/2017: 80-90% LAD stenosis (PCI/DES placement with a 2.75x16 mm Promus Premier stent). No significant stenosis along RCA or LCx.    DJD (degenerative joint disease)    GERD (gastroesophageal reflux disease)    Hyperlipidemia    Hypertension    dx s/p MI    Ischemic cardiomyopathy    Myocardial infarction (HCC) 03/17/2017   03-2017 treated at [facility]    Osteoporosis    Status post insertion of drug-eluting stent into left anterior descending (LAD) artery 03/2017   Past Surgical History:  Procedure Laterality Date   APPENDECTOMY      CESAREAN SECTION     CHOLECYSTECTOMY N/A 11/05/2017   Procedure: LAPAROSCOPIC CHOLECYSTECTOMY WITH INTRAOPERATIVE CHOLANGIOGRAM;  Surgeon: Darnell Level, MD;  Location: [facility];  Service: General;  Laterality: N/A;   ERCP N/A 11/11/2017   Procedure: ENDOSCOPIC RETROGRADE CHOLANGIOPANCREATOGRAPHY (ERCP);  Surgeon: Willis Modena, MD;  Location: [facility];  Service: Endoscopy;  Laterality: N/A;  possible   EUS N/A 11/11/2017   Procedure: UPPER ENDOSCOPIC ULTRASOUND (EUS) RADIAL;  Surgeon: Willis Modena, MD;  Location: [facility];  Service: Endoscopy;  Laterality: N/A;   FEMUR IM NAIL Right 07/13/2019   Procedure: INTRAMEDULLARY (IM) RETROGRADE FEMORAL NAILING;  Surgeon: Ollen Gross, MD;  Location: [facility];  Service: Orthopedics;  Laterality: Right;    LUMBAR LAMINECTOMY/ DECOMPRESSION WITH MET-RX     OPEN REDUCTION INTERNAL FIXATION ACETABULAR FRACTURE STOPPA Right 05/14/2022   Procedure: OPEN REDUCTION INTERNAL FIXATION RIGHT ACETABULUM;  Surgeon: Roby Lofts, MD;  Location: [facility] OR;  Service: Orthopedics;  Laterality: Right;   REVERSE SHOULDER ARTHROPLASTY Right 05/15/2022   Procedure: RIGHT REVERSE SHOULDER ARTHROPLASTY;  Surgeon: Yolonda Kida, MD;  Location: [facility] OR;  Service: Orthopedics;  Laterality: Right;   SHOULDER ARTHROSCOPY WITH ROTATOR CUFF REPAIR AND SUBACROMIAL DECOMPRESSION Right 09/20/2021   Procedure: SHOULDER MINI OPEN ROTATOR CUFF REPAIR AND SUBACROMIAL DECOMPRESSION WITH POSSIBLE PATCH GRAFT;  Surgeon: Jene Every, MD;  Location: [facility];  Service: Orthopedics;  Laterality: Right;  90 MINS   stent  Left 03/2017   anterior descending ; drug eluting ; tx of MI at [facility]    TONSILLECTOMY     VAGINAL HYSTERECTOMY     vaginal sling     Social History:  reports that she has quit smoking. Her smoking use included cigarettes. She has a 30.00 pack-year smoking history. She has never used smokeless tobacco. She reports current alcohol use. She  reports that she does not use drugs.  No Known Allergies  Family History  Problem Relation Age of Onset   Heart disease Father    Breast cancer Sister    Pancreatic cancer Sister     Prior to Admission medications   Medication Sig Start Date End Date Taking? Authorizing Provider  acetaminophen (TYLENOL) 500 MG tablet Take 1,000 mg by mouth See admin instructions. Take 1,000 mg by mouth at bedtime and an additional 1,000 mg once a day as needed for pain   Yes [provider]  apixaban (ELIQUIS) 5 MG TABS tablet Take 1 tablet (5 mg total) by mouth 2 (two) times daily. 08/12/22  Yes Gwyneth Sprout, MD  aspirin EC 81 MG tablet Take 81 mg by mouth in the morning. Swallow whole.   Yes [provider]  ascorbic acid (VITAMIN C) 500 MG tablet Take 500 mg by mouth daily.    [provider]  atorvastatin (LIPITOR) 80 MG tablet Take 1 tablet (80 mg total) by mouth daily. 11/05/21   Kathleene Hazel, MD  bisacodyl (DULCOLAX) 10 MG suppository Place 1 suppository (10 mg total) rectally daily as needed for moderate constipation. 05/19/22   Joseph Art, DO  Calcium Carb-Cholecalciferol (CALCIUM 600/VITAMIN D PO) Take 1 tablet by mouth in the morning and at bedtime.    [provider]  cetirizine (ZYRTEC) 10 MG tablet Take 10 mg by mouth daily.    [provider]  Cholecalciferol (VITAMIN D3) 2000 units capsule Take 2,000 Units by mouth daily.    [provider]  CRANBERRY EXTRACT PO Take 25,000 mg by mouth daily.    [provider]  diltiazem (CARDIZEM CD) 120 MG 24 hr capsule Take 1 capsule (120 mg total) by mouth daily. 08/18/22   Kathleene Hazel, MD  docusate sodium (COLACE) 100 MG capsule Take 1 capsule (100 mg total) by mouth 2 (two) times daily as needed for mild constipation. 09/20/21   Jene Every, MD  feeding supplement (ENSURE ENLIVE / ENSURE PLUS) LIQD Take 237 mLs by mouth 2 (two) times daily between meals. 05/19/22    Joseph Art, DO  latanoprost (XALATAN) 0.005 % ophthalmic solution Place 1 drop into both eyes at bedtime. 08/12/21   [provider]  methenamine (HIPREX) 1 g tablet 1 g 2 (two) times daily with a meal. 03/06/22   [provider]  metoprolol succinate (TOPROL-XL) 25 MG 24 hr tablet Take 0.5 tablets (12.5 mg total) by mouth daily. 05/19/22   Joseph Art, DO  Multiple Vitamin (MULTI-VITAMINS) TABS Take 1 tablet by mouth daily.    [provider]  omeprazole (PRILOSEC) 20 MG capsule Take 20 mg by mouth daily.    [provider]  oxyCODONE (OXY IR/ROXICODONE) 5 MG immediate release tablet Take 1-2 tablets (5-10 mg total) by mouth every 4 (four) hours as needed for moderate pain or severe pain (5 mg moderate pain, 10 mg severe pain). 05/21/22   Leatha Gilding, MD  polyethylene glycol (MIRALAX / GLYCOLAX) 17 g packet Take 17 g by mouth daily. 05/19/22   Joseph Art, DO  Probiotic Product (TRUNATURE DIGESTIVE PROBIOTIC)  CAPS Take 1 capsule by mouth daily.    [provider]  senna (SENOKOT) 8.6 MG TABS tablet Take 1 tablet (8.6 mg total) by mouth 2 (two) times daily. 05/19/22   Geradine Girt, DO  timolol (TIMOPTIC) 0.5 % ophthalmic solution Place 1 drop into the left eye 2 (two) times daily. 07/25/21   [provider]  traZODone (DESYREL) 50 MG tablet Take 50 mg by mouth at bedtime as needed for sleep.    [provider]  Trospium Chloride 60 MG CP24 Take 1 capsule by mouth daily. 04/08/22   [provider]    Physical Exam: Vitals:   08/20/22 1715 08/20/22 1730 08/20/22 1745 08/20/22 1800  BP:   (!) 167/82 (!) 165/151  Pulse: (!) 106 (!) 109 (!) 110 (!) 110  Resp:    18  Temp:      TempSrc:      SpO2: 98% 95% 94% 94%  Weight:      Height:       Constitutional: NAD, calm, comfortable Eyes: PERRL, lids and conjunctivae normal ENMT: Mucous membranes are moist. Posterior pharynx clear of any exudate or  lesions.Normal dentition.  Neck: normal, supple, no masses, no thyromegaly Respiratory: clear to auscultation bilaterally, no wheezing, no crackles. Normal respiratory effort. No accessory muscle use.  Cardiovascular: Regular rate and rhythm, no murmurs / rubs / gallops. No extremity edema. 2+ pedal pulses. No carotid bruits.  Abdomen: no tenderness, no masses palpated. No hepatosplenomegaly. Bowel sounds positive.  Musculoskeletal: Decreased range of motion, laterally rotated right lower leg, tenderness  skin: no rashes, lesions, ulcers. No induration Neurologic: CN 2-12 grossly intact. Sensation intact, DTR normal. Strength 5/5 in all 4.  Psychiatric: Normal judgment and insight. Alert and oriented x 3. Normal mood  Data Reviewed:  Sodium 129, potassium 4.2, chloride 92, glucose 137.  White count 10.9 hemoglobin 9.5.  INR 1.6 PT 18.8.  CT of the right knee showed comminuted and displaced fracture of the distal femoral metadiaphysis with posterior angulation.  X-ray of the femur showed prior ORIF as well as the fracture of the distal femur.  Patient being admitted for further evaluation and treatment  Assessment and Plan:  #1 distal femoral fracture: Patient will be admitted.  Orthopedics consulted.  Pain management.  Patient to have surgical repair after Eliquis washout.  #2 atrial fibrillation: New diagnosis.  Currently on Eliquis that was stopped.  Continue rate control.  #3 coronary artery disease: Stable.  No acute decompensation  #4 essential hypertension: Continue blood pressure medication.  Currently controlled.  #5 hyperlipidemia: Continue statin  #6 hyponatremia: Sodium level 127.  Hydrate and monitor.     Advance Care Planning:   Code Status: Full Code   Consults: Dr. Doreatha Martin  Family Communication: Son at bedside  Severity of Illness: The appropriate patient status for this patient is INPATIENT. Inpatient status is judged to be reasonable and necessary in order to  provide the required intensity of service to ensure the patient's safety. The patient's presenting symptoms, physical exam findings, and initial radiographic and laboratory data in the context of their chronic comorbidities is felt to place them at high risk for further clinical deterioration. Furthermore, it is not anticipated that the patient will be medically stable for discharge from the hospital within 2 midnights of admission.   * I certify that at the point of admission it is my clinical judgment that the patient will require inpatient hospital care spanning beyond 2 midnights from the point  of admission due to high intensity of service, high risk for further deterioration and high frequency of surveillance required.*  AuthorBarbette Merino, MD 08/20/2022 7:21 PM  For on call review [email or web address].

## 2022-08-20 NOTE — ED Notes (Signed)
Notified MD that the patient wants more for pain

## 2022-08-20 NOTE — Consult Note (Cosign Needed Addendum)
ORTHOPAEDIC CONSULTATION  REQUESTING PHYSICIAN: Drenda Freeze, MD  Chief Complaint: Right leg pain  HPI: Carrie Barnes is a 87 y.o. female with history of CAD s/p stent, previous MI., osteoporosis, GERD, hyperlipidemia, hypertension with, recent Afib diagnosis 08/11/22 recently started on Eliquis who complains of right leg pain after fall.  She states she was walking with a rollator when she tripped falling directly on her right side.  She noticed pain, swelling, and inability to bear weight.  At this time she states right upper leg pain is severe worsening with movement and improving with rest and IV pain medication. She is comfortable at this time, recently received dilaudid. She denies pain in any other areas.  She currently walks with a Rolator.  She denies hitting her head.  Last Eliquis dose was this morning. She has been on 10 mg BID since ED visit 08/11/22 and just started 5 mg BID yesterday.  She had a recent fall on 05/12/22 resulting in a right superior pubic ramus and acetabulum fractures s/p ORIF with Dr. Doreatha Martin and right proximal humerus fracture s/p right reverse total shoulder arthroplasty with Dr. Stann Mainland. Prior to these injuries she walked without an assistive device. Denies CP, SOB, vomiting, or paraesthesias. She did have some nausea after receiving dilaudid but this has improved.  Past Medical History:  Diagnosis Date   Cholecystitis    Coronary artery disease    a. 03/2017: 80-90% LAD stenosis (PCI/DES placement with a 2.75x16 mm Promus Premier stent). No significant stenosis along RCA or LCx.    DJD (degenerative joint disease)    GERD (gastroesophageal reflux disease)    Hyperlipidemia    Hypertension    dx s/p MI    Ischemic cardiomyopathy    Myocardial infarction ([place]) 03/17/2017   03-2017 treated at [facility]    Osteoporosis    Status post insertion of drug-eluting stent into left anterior descending (LAD) artery 03/2017   Past Surgical History:   Procedure Laterality Date   APPENDECTOMY     CESAREAN SECTION     CHOLECYSTECTOMY N/A 11/05/2017   Procedure: LAPAROSCOPIC CHOLECYSTECTOMY WITH INTRAOPERATIVE CHOLANGIOGRAM;  Surgeon: Armandina Gemma, MD;  Location: [facility];  Service: General;  Laterality: N/A;   ERCP N/A 11/11/2017   Procedure: ENDOSCOPIC RETROGRADE CHOLANGIOPANCREATOGRAPHY (ERCP);  Surgeon: Arta Silence, MD;  Location: [facility];  Service: Endoscopy;  Laterality: N/A;  possible   EUS N/A 11/11/2017   Procedure: UPPER ENDOSCOPIC ULTRASOUND (EUS) RADIAL;  Surgeon: Arta Silence, MD;  Location: [facility];  Service: Endoscopy;  Laterality: N/A;   FEMUR IM NAIL Right 07/13/2019   Procedure: INTRAMEDULLARY (IM) RETROGRADE FEMORAL NAILING;  Surgeon: Gaynelle Arabian, MD;  Location: [facility];  Service: Orthopedics;  Laterality: Right;  23min   LUMBAR LAMINECTOMY/ DECOMPRESSION WITH MET-RX     OPEN REDUCTION INTERNAL FIXATION ACETABULAR FRACTURE STOPPA Right 05/14/2022   Procedure: OPEN REDUCTION INTERNAL FIXATION RIGHT ACETABULUM;  Surgeon: Shona Needles, MD;  Location: [facility];  Service: Orthopedics;  Laterality: Right;   REVERSE SHOULDER ARTHROPLASTY Right 05/15/2022   Procedure: RIGHT REVERSE SHOULDER ARTHROPLASTY;  Surgeon: Nicholes Stairs, MD;  Location: [facility];  Service: Orthopedics;  Laterality: Right;   SHOULDER ARTHROSCOPY WITH ROTATOR CUFF REPAIR AND SUBACROMIAL DECOMPRESSION Right 09/20/2021   Procedure: SHOULDER MINI OPEN ROTATOR CUFF REPAIR AND SUBACROMIAL DECOMPRESSION WITH POSSIBLE PATCH GRAFT;  Surgeon: Susa Day, MD;  Location: [facility];  Service: Orthopedics;  Laterality: Right;  90 MINS   stent  Left 03/2017  anterior descending ; drug eluting ; tx of MI at [facility]    TONSILLECTOMY     VAGINAL HYSTERECTOMY     vaginal sling     Social History   Socioeconomic History   Marital status: Widowed    Spouse name: Not on file   Number of children: 4   Years of education: Not on file    Highest education level: Not on file  Occupational History   Occupation: Homemaker   Occupation: [employer]   Tobacco Use   Smoking status: Former    Packs/day: 1.00    Years: 30.00    Total pack years: 30.00    Types: Cigarettes   Smokeless tobacco: Never   Tobacco comments:    quit 30 years   Vaping Use   Vaping Use: Never used  Substance and Sexual Activity   Alcohol use: Yes    Comment: 3 times a year   Drug use: Never   Sexual activity: Not Currently  Other Topics Concern   Not on file  Social History Narrative   Not on file   Social Determinants of Health   Financial Resource Strain: Not on file  Food Insecurity: No Food Insecurity (05/12/2022)   Hunger Vital Sign    Worried About Running Out of Food in the Last Year: Never true    Ran Out of Food in the Last Year: Never true  Transportation Needs: No Transportation Needs (05/12/2022)   PRAPARE - Hydrologist (Medical): No    Lack of Transportation (Non-Medical): No  Physical Activity: Not on file  Stress: Not on file  Social Connections: Not on file   Family History  Problem Relation Age of Onset   Heart disease Father    Breast cancer Sister    Pancreatic cancer Sister    No Known Allergies   Positive ROS: All other systems have been reviewed and were otherwise negative with the exception of those mentioned in the HPI and as above.  Physical Exam: General: Alert, no acute distress Cardiovascular: No pedal edema Respiratory: No cyanosis, no use of accessory musculature GI: No organomegaly, abdomen is soft and non-tender Skin: No lesions in the area of chief complaint Neurologic: Sensation intact distally Psychiatric: Patient is competent for consent with normal mood and affect Lymphatic: No axillary or cervical lymphadenopathy  MUSCULOSKELETAL:.  Significant edema to distal right thigh.  Compartments compressible. She endorses sensation to light  touch to all aspects of thigh and lower leg. Able to dorsiflex and plantarflex at ankle. 2+ DP pulse. Sensation intact to all aspects of right foot.   CT of right femur shows comminuted distal femur fracture  Assessment: Right comminuted distal femur fracture  Plan: This is a significant severe injury.  Discussed risks and benefits of surgical intervention with patient and daughter.  Patient amenable to plan for transport to [facility] for ORIF of distal femur with orthopedic trauma team tomorrow if medically optimized.  Plan to hold Eliquis in anticipation for surgical intervention. Okay to eat this evening but should remain n.p.o. after midnight.  Will order Buck's traction, though this should be placed once she is received at [facility]. Discussed risk  of compartment syndrome after this injury combined with recent Eliquis start with patient and daughter. At this time she is swollen but no signs or symptoms of compartment syndrome. Q 2 hour neurovascular checks, please call overnight if pain uncontrolled with IV pain medication.  Armida Sans, PA-C  Contact information:   Janine Ores, PA-C Weekdays 8-5  After hours and holidays please check [email or web address] for group call information for [facility]  08/20/2022 6:15 PM

## 2022-08-20 NOTE — ED Triage Notes (Signed)
EMS STATES THAT THE PATIENT HAD A FALL ABOUT 1 HR. AGO. HAS DEFORMITY AND SWELLING NOTED TO THE RIGHT KNEE. DENIES ANY LOC OR HITTING HER HEAD DURING FALL. SHE STATES SHE DOES TAKE BLOOD THINNERS.

## 2022-08-20 NOTE — ED Notes (Signed)
Notified,

## 2022-08-20 NOTE — ED Notes (Signed)
Place the patient on 2L of O2

## 2022-08-20 NOTE — ED Notes (Signed)
Called report to [facility] at [facility]. Awaiting transport from [facility]

## 2022-08-20 NOTE — ED Notes (Signed)
Attempted to call report on the patient, they asked for [place] to call back later.

## 2022-08-20 NOTE — ED Provider Notes (Signed)
[facility]-EMERGENCY DEPT Provider Note   CSN: [id number] Arrival date & time: 08/20/22  1515     History  Chief Complaint  Patient presents with   Knee Injury    TERYN BOEREMA is a 87 y.o. female history of CAD, A-fib on Eliquis, here presenting with knee injury.  Patient states that she walks with a walker and had a mechanical fall onto the right knee.  She states that she has severe pain and swelling afterwards unable to bear weight.  Denies any head injury or other injuries.  Denies any hip pain.  The history is provided by the patient.       Home Medications Prior to Admission medications   Medication Sig Start Date End Date Taking? Authorizing Provider  acetaminophen (TYLENOL) 500 MG tablet Take 500-1,000 mg by mouth every 8 (eight) hours as needed for moderate pain or headache.    [provider]  apixaban (ELIQUIS) 5 MG TABS tablet Take 1 tablet (5 mg total) by mouth 2 (two) times daily. Patient taking differently: Take 10 mg by mouth 2 (two) times daily.  Will be on 1 tab 2 times per day starting 08/19/2022 08/12/22   Gwyneth Sprout, MD  ascorbic acid (VITAMIN C) 500 MG tablet Take 500 mg by mouth daily.    [provider]  atorvastatin (LIPITOR) 80 MG tablet Take 1 tablet (80 mg total) by mouth daily. 11/05/21   Kathleene Hazel, MD  bisacodyl (DULCOLAX) 10 MG suppository Place 1 suppository (10 mg total) rectally daily as needed for moderate constipation. 05/19/22   Joseph Art, DO  Calcium Carb-Cholecalciferol (CALCIUM 600/VITAMIN D PO) Take 1 tablet by mouth in the morning and at bedtime.    [provider]  cetirizine (ZYRTEC) 10 MG tablet Take 10 mg by mouth daily.    [provider]  Cholecalciferol (VITAMIN D3) 2000 units capsule Take 2,000 Units by mouth daily.    [provider]  CRANBERRY EXTRACT PO Take 25,000 mg by mouth daily.    [provider]  diltiazem (CARDIZEM CD)  120 MG 24 hr capsule Take 1 capsule (120 mg total) by mouth daily. 08/18/22   Kathleene Hazel, MD  docusate sodium (COLACE) 100 MG capsule Take 1 capsule (100 mg total) by mouth 2 (two) times daily as needed for mild constipation. 09/20/21   Jene Every, MD  feeding supplement (ENSURE ENLIVE / ENSURE PLUS) LIQD Take 237 mLs by mouth 2 (two) times daily between meals. 05/19/22   Joseph Art, DO  latanoprost (XALATAN) 0.005 % ophthalmic solution Place 1 drop into both eyes at bedtime. 08/12/21   [provider]  methenamine (HIPREX) 1 g tablet 1 g 2 (two) times daily with a meal. 03/06/22   [provider]  metoprolol succinate (TOPROL-XL) 25 MG 24 hr tablet Take 0.5 tablets (12.5 mg total) by mouth daily. 05/19/22   Joseph Art, DO  Multiple Vitamin (MULTI-VITAMINS) TABS Take 1 tablet by mouth daily.    [provider]  omeprazole (PRILOSEC) 20 MG capsule Take 20 mg by mouth daily.    [provider]  oxyCODONE (OXY IR/ROXICODONE) 5 MG immediate release tablet Take 1-2 tablets (5-10 mg total) by mouth every 4 (four) hours as needed for moderate pain or severe pain (5 mg moderate pain, 10 mg severe pain). 05/21/22   Leatha Gilding, MD  Polyethyl Glycol-Propyl Glycol (SYSTANE OP) Place 1 drop into both eyes daily as needed (dry  eyes).    [provider]  polyethylene glycol (MIRALAX / GLYCOLAX) 17 g packet Take 17 g by mouth daily. 05/19/22   Geradine Girt, DO  Probiotic Product (TRUNATURE DIGESTIVE PROBIOTIC) CAPS Take 1 capsule by mouth daily.    [provider]  senna (SENOKOT) 8.6 MG TABS tablet Take 1 tablet (8.6 mg total) by mouth 2 (two) times daily. 05/19/22   Geradine Girt, DO  timolol (TIMOPTIC) 0.5 % ophthalmic solution Place 1 drop into the left eye 2 (two) times daily. 07/25/21   [provider]  traZODone (DESYREL) 50 MG tablet Take 50 mg by mouth at bedtime as needed for sleep.    [provider]   Trospium Chloride 60 MG CP24 Take 1 capsule by mouth daily. 04/08/22   [provider]      Allergies    Patient has no known allergies.    Review of Systems   Review of Systems  Musculoskeletal:        Right knee swelling and pain  All other systems reviewed and are negative.   Physical Exam Updated Vital Signs BP (!) 165/151   Pulse (!) 110   Temp 98.3 F (36.8 C) (Oral)   Resp 18   Ht 5\' 2"  (1.575 m)   Wt 69 kg   SpO2 94%   BMI 27.82 kg/m  Physical Exam Vitals and nursing note reviewed.  Constitutional:      Appearance: Normal appearance.     Comments: Uncomfortable   HENT:     Head: Normocephalic.     Nose: Nose normal.     Mouth/Throat:     Mouth: Mucous membranes are moist.  Eyes:     Extraocular Movements: Extraocular movements intact.     Pupils: Pupils are equal, round, and reactive to light.  Cardiovascular:     Rate and Rhythm: Normal rate and regular rhythm.     Pulses: Normal pulses.     Heart sounds: Normal heart sounds.  Pulmonary:     Effort: Pulmonary effort is normal.     Breath sounds: Normal breath sounds.  Abdominal:     General: Abdomen is flat.     Palpations: Abdomen is soft.  Musculoskeletal:     Cervical back: Normal range of motion.     Comments: Patient has obvious right knee swelling.  The leg remained flexed around 90 degree.  Patient not able to fully extend it.  No obvious ecchymosis right now.  There is no obvious skin breakdown.  Patient has no hip tenderness but has some tenderness in the distal femur.  No tib-fib tenderness.  No other extremity trauma.  2+ DP pulse on the right foot and patient able to wiggle the right foot.  Neurological:     General: No focal deficit present.     Mental Status: She is alert and oriented to person, place, and time.  Psychiatric:        Mood and Affect: Mood normal.        Behavior: Behavior normal.     ED Results / Procedures / Treatments   Labs (all labs ordered are listed,  but only abnormal results are displayed) Labs Reviewed  CBC WITH DIFFERENTIAL/PLATELET - Abnormal; Notable for the following components:      Result Value   WBC 10.9 (*)    RBC 3.20 (*)    Hemoglobin 9.5 (*)    HCT 28.1 (*)    Neutro Abs 8.2 (*)  Monocytes Absolute 1.1 (*)    Abs Immature Granulocytes 0.12 (*)    All other components within normal limits  BASIC METABOLIC PANEL - Abnormal; Notable for the following components:   Sodium 129 (*)    Chloride 92 (*)    Glucose, Bld 137 (*)    Calcium 8.8 (*)    All other components within normal limits  PROTIME-INR - Abnormal; Notable for the following components:   Prothrombin Time 18.8 (*)    INR 1.6 (*)    All other components within normal limits    EKG None  Radiology DG Femur Min 2 Views Right  Result Date: 08/20/2022 CLINICAL DATA:  Fall, distal femur fracture. EXAM: RIGHT FEMUR 2 VIEWS COMPARISON:  CT scan 08/20/2022 FINDINGS: OTA 33 C2 fracture of the distal femur noted with comminuted transverse component with apex anterior angulation between the dominant fragments, and a longitudinal component extending to the intercondylar notch and femoral trochlear groove. The dominant shaft fragment is anteriorly displaced with respect to the intermediary and distal fragments by about 2.4 cm. The patient has a proximal femoral nail system in place and acetabular mending plate and screw fixation extending to the superior pubis compatible with prior acetabular ORIF. Degenerative loss of articular space in the right hip. IMPRESSION: 1. OTA 33 C2 fracture of the distal femur with apex anterior angulation, displacement, and extension to the intercondylar notch and femoral trochlear groove. 2. Prior ORIF of the right acetabulum. Right proximal femoral nail system noted. Electronically Signed   By: Gaylyn Rong M.D.   On: 08/20/2022 18:08   CT Knee Right Wo Contrast  Result Date: 08/20/2022 CLINICAL DATA:  Knee trauma, tibial plateau  fracture (Age >= 5y) R knee injury EXAM: CT OF THE RIGHT KNEE WITHOUT CONTRAST TECHNIQUE: Multidetector CT imaging of the right knee was performed according to the standard protocol. Multiplanar CT image reconstructions were also generated. RADIATION DOSE REDUCTION: This exam was performed according to the departmental dose-optimization program which includes automated exposure control, adjustment of the mA and/or kV according to patient size and/or use of iterative reconstruction technique. COMPARISON:  None Available. FINDINGS: Bones/Joint/Cartilage There is a comminuted displaced fracture of the distal femoral metadiaphysis, with posterior angulation. There is an oblique transverse component and longitudinal split extending to the knee joint through the intercondylar notch. There is up to 2.0 cm posterior displacement of the medial distal fracture segment and 0.8 cm lateral displacement of the lateral distal fracture segment. There is a large lipohemarthrosis of the knee. There is no evidence of a proximal tibial fracture or proximal fibular fracture. There is mild tricompartment osteoarthritis of the knee. Ligaments Suboptimally assessed by CT. Muscles and Tendons No acute myotendinous abnormality by CT. Soft tissues There is a small hematoma along the posterior aspect of the fracture measuring approximately 3.2 x 2.6 x 2.9 cm (series 4, image 56, series 9, image 55). IMPRESSION: Comminuted and displaced fracture of the distal femoral metadiaphysis with posterior angulation and longitudinal split component extending to the knee joint through the intercondylar notch. Large lipohemarthrosis. Adjacent small hematoma posteriorly. Electronically Signed   By: Caprice Renshaw M.D.   On: 08/20/2022 16:55    Procedures Procedures    Medications Ordered in ED Medications  lip balm (CARMEX) ointment (has no administration in time range)  morphine (PF) 4 MG/ML injection 4 mg (4 mg Intravenous Given 08/20/22 1604)   HYDROmorphone (DILAUDID) injection 1 mg (1 mg Intravenous Given 08/20/22 1643)  ondansetron (ZOFRAN) injection 4 mg (  4 mg Intravenous Given 08/20/22 1645)  HYDROmorphone (DILAUDID) injection 1 mg (1 mg Intravenous Given 08/20/22 1819)    ED Course/ Medical Decision Making/ A&P                           Medical Decision Making LILU MCGLOWN is a 87 y.o. female here presenting with right knee pain after injury.  Concern for possible tibial plateau fracture versus knee hematoma after fall.  Plan to get CBC and BMP and also give pain medicine and get CT of the right knee.  6:22 PM CT showed displaced fracture of the distal femoral metadiaphysis with posterior angulation and longitudinal split.  Patient also has hemarthrosis.  Discussed with Dr. Mardelle Matte from [facility].  He recommend medicine admission and transfer to [facility] for Dr. Marcelino Scot to operate.  He will see the patient tonight.   Problems Addressed: Closed fracture of distal end of right femur, unspecified fracture morphology, initial encounter [facility]): acute illness or injury  Amount and/or Complexity of Data Reviewed Labs: ordered. Decision-making details documented in ED Course. Radiology: ordered and independent interpretation performed. Decision-making details documented in ED Course.  Risk OTC drugs. Prescription drug management. Decision regarding hospitalization.    Final Clinical Impression(s) / ED Diagnoses Final diagnoses:  None    Rx / DC Orders ED Discharge Orders     None         Drenda Freeze, MD 08/20/22 Jeri Lager

## 2022-08-21 ENCOUNTER — Inpatient Hospital Stay: Admit: 2022-08-21 | Payer: Medicare Other | Admitting: Student

## 2022-08-21 ENCOUNTER — Inpatient Hospital Stay (HOSPITAL_COMMUNITY): Payer: Medicare Other

## 2022-08-21 ENCOUNTER — Inpatient Hospital Stay (HOSPITAL_COMMUNITY): Payer: Medicare Other | Admitting: Certified Registered"

## 2022-08-21 ENCOUNTER — Other Ambulatory Visit: Payer: Self-pay

## 2022-08-21 ENCOUNTER — Encounter (HOSPITAL_COMMUNITY)
Admission: EM | Disposition: A | Discharge status: Skilled Nursing Facility | Payer: Self-pay | Source: Home / Self Care | Attending: Internal Medicine

## 2022-08-21 ENCOUNTER — Encounter (HOSPITAL_COMMUNITY): Payer: Self-pay | Admitting: Internal Medicine

## 2022-08-21 DIAGNOSIS — S72431A Displaced fracture of medial condyle of right femur, initial encounter for closed fracture: Secondary | ICD-10-CM | POA: Diagnosis not present

## 2022-08-21 DIAGNOSIS — I10 Essential (primary) hypertension: Secondary | ICD-10-CM | POA: Diagnosis not present

## 2022-08-21 DIAGNOSIS — S72401A Unspecified fracture of lower end of right femur, initial encounter for closed fracture: Secondary | ICD-10-CM | POA: Diagnosis not present

## 2022-08-21 DIAGNOSIS — I2510 Atherosclerotic heart disease of native coronary artery without angina pectoris: Secondary | ICD-10-CM | POA: Diagnosis not present

## 2022-08-21 DIAGNOSIS — I252 Old myocardial infarction: Secondary | ICD-10-CM

## 2022-08-21 DIAGNOSIS — Z87891 Personal history of nicotine dependence: Secondary | ICD-10-CM

## 2022-08-21 DIAGNOSIS — S72421A Displaced fracture of lateral condyle of right femur, initial encounter for closed fracture: Secondary | ICD-10-CM | POA: Diagnosis not present

## 2022-08-21 HISTORY — PX: ORIF FEMUR FRACTURE: SHX2119

## 2022-08-21 LAB — COMPREHENSIVE METABOLIC PANEL
ALT: 18 U/L (ref 0–44)
AST: 25 U/L (ref 15–41)
Albumin: 3 g/dL — ABNORMAL LOW (ref 3.5–5.0)
Alkaline Phosphatase: 64 U/L (ref 38–126)
Anion gap: 13 (ref 5–15)
BUN: 11 mg/dL (ref 8–23)
CO2: 26 mmol/L (ref 22–32)
Calcium: 8.9 mg/dL (ref 8.9–10.3)
Chloride: 91 mmol/L — ABNORMAL LOW (ref 98–111)
Creatinine, Ser: 0.81 mg/dL (ref 0.44–1.00)
GFR, Estimated: >60 mL/min (ref >60)
Glucose, Bld: 151 mg/dL — ABNORMAL HIGH (ref 70–99)
Potassium: 3.8 mmol/L (ref 3.5–5.1)
Sodium: 130 mmol/L — ABNORMAL LOW (ref 135–145)
Total Bilirubin: 0.6 mg/dL (ref 0.3–1.2)
Total Protein: 6.5 g/dL (ref 6.5–8.1)

## 2022-08-21 LAB — CBC
HCT: 26.9 % — ABNORMAL LOW (ref 36.0–46.0)
Hemoglobin: 8.7 g/dL — ABNORMAL LOW (ref 12.0–15.0)
MCH: 29.2 pg (ref 26.0–34.0)
MCHC: 32.3 g/dL (ref 30.0–36.0)
MCV: 90.3 fL (ref 80.0–100.0)
Platelets: 379 10*3/uL (ref 150–400)
RBC: 2.98 MIL/uL — ABNORMAL LOW (ref 3.87–5.11)
RDW: 12.7 % (ref 11.5–15.5)
WBC: 14.6 10*3/uL — ABNORMAL HIGH (ref 4.0–10.5)
nRBC: 0 % (ref 0.0–0.2)

## 2022-08-21 LAB — PREPARE RBC (CROSSMATCH)

## 2022-08-21 LAB — SURGICAL PCR SCREEN
MRSA, PCR: NEGATIVE
Staphylococcus aureus: NEGATIVE

## 2022-08-21 LAB — VITAMIN D 25 HYDROXY (VIT D DEFICIENCY, FRACTURES): Vit D, 25-Hydroxy: 40.6 ng/mL (ref 30–100)

## 2022-08-21 SURGERY — OPEN REDUCTION INTERNAL FIXATION (ORIF) DISTAL FEMUR FRACTURE
Anesthesia: General | Laterality: Right

## 2022-08-21 MED ORDER — SODIUM CHLORIDE 0.9 % IV SOLN: INTRAVENOUS | Status: DC

## 2022-08-21 MED ORDER — METOCLOPRAMIDE HCL 5 MG/ML IJ SOLN: 5.0000 mg | Freq: Three times a day (TID) | INTRAMUSCULAR | Status: DC | PRN

## 2022-08-21 MED ORDER — CEFAZOLIN SODIUM-DEXTROSE 2-4 GM/100ML-% IV SOLN
2.0000 g | Freq: Three times a day (TID) | INTRAVENOUS | Status: AC
  Administered 2022-08-21 – 2022-08-22 (×3): 2 g via INTRAVENOUS
  Filled 2022-08-21 (×3): qty 100

## 2022-08-21 MED ORDER — ROCURONIUM BROMIDE 10 MG/ML (PF) SYRINGE
PREFILLED_SYRINGE | INTRAVENOUS | Status: DC | PRN
  Administered 2022-08-21: 70 mg via INTRAVENOUS

## 2022-08-21 MED ORDER — SODIUM CHLORIDE 0.9 % IV SOLN: INTRAVENOUS | Status: DC | PRN

## 2022-08-21 MED ORDER — EPHEDRINE SULFATE-NACL 50-0.9 MG/10ML-% IV SOSY
PREFILLED_SYRINGE | INTRAVENOUS | Status: DC | PRN
  Administered 2022-08-21: 10 mg via INTRAVENOUS

## 2022-08-21 MED ORDER — CEFAZOLIN SODIUM-DEXTROSE 2-4 GM/100ML-% IV SOLN: 2.0000 g | Freq: Three times a day (TID) | INTRAVENOUS | Status: DC

## 2022-08-21 MED ORDER — POVIDONE-IODINE 10 % EX SWAB
2.0000 | Freq: Once | CUTANEOUS | Status: AC
  Administered 2022-08-21: 2 via TOPICAL

## 2022-08-21 MED ORDER — VANCOMYCIN HCL 1000 MG IV SOLR
INTRAVENOUS | Status: AC
  Filled 2022-08-21: qty 20

## 2022-08-21 MED ORDER — SODIUM CHLORIDE 0.9 % IV SOLN
INTRAVENOUS | Status: DC
  Administered 2022-08-21: 75 mL via INTRAVENOUS

## 2022-08-21 MED ORDER — DOCUSATE SODIUM 100 MG PO CAPS
100.0000 mg | ORAL_CAPSULE | Freq: Two times a day (BID) | ORAL | Status: DC
  Administered 2022-08-21 – 2022-08-23 (×4): 100 mg via ORAL
  Filled 2022-08-21 (×4): qty 1

## 2022-08-21 MED ORDER — ONDANSETRON HCL 4 MG PO TABS: 4.0000 mg | ORAL_TABLET | Freq: Four times a day (QID) | ORAL | Status: DC | PRN

## 2022-08-21 MED ORDER — LIDOCAINE 2% (20 MG/ML) 5 ML SYRINGE
INTRAMUSCULAR | Status: DC | PRN
  Administered 2022-08-21: 60 mg via INTRAVENOUS

## 2022-08-21 MED ORDER — FENTANYL CITRATE (PF) 100 MCG/2ML IJ SOLN
INTRAMUSCULAR | Status: DC | PRN
  Administered 2022-08-21: 50 ug via INTRAVENOUS
  Administered 2022-08-21: 75 ug via INTRAVENOUS

## 2022-08-21 MED ORDER — ATORVASTATIN CALCIUM 80 MG PO TABS
80.0000 mg | ORAL_TABLET | Freq: Every day | ORAL | Status: DC
  Administered 2022-08-21 – 2022-08-24 (×4): 80 mg via ORAL
  Filled 2022-08-21 (×4): qty 1

## 2022-08-21 MED ORDER — ALBUMIN HUMAN 5 % IV SOLN: INTRAVENOUS | Status: DC | PRN

## 2022-08-21 MED ORDER — DILTIAZEM HCL ER COATED BEADS 120 MG PO CP24
120.0000 mg | ORAL_CAPSULE | Freq: Every day | ORAL | Status: DC
  Administered 2022-08-21 – 2022-08-25 (×5): 120 mg via ORAL
  Filled 2022-08-21 (×6): qty 1

## 2022-08-21 MED ORDER — HYDROCODONE-ACETAMINOPHEN 5-325 MG PO TABS
1.0000 | ORAL_TABLET | ORAL | Status: DC | PRN
  Administered 2022-08-21 – 2022-08-22 (×2): 2 via ORAL
  Administered 2022-08-22: 1 via ORAL
  Administered 2022-08-23 – 2022-08-25 (×3): 2 via ORAL
  Filled 2022-08-21: qty 2
  Filled 2022-08-21: qty 1
  Filled 2022-08-21 (×4): qty 2

## 2022-08-21 MED ORDER — VANCOMYCIN HCL 1000 MG IV SOLR
INTRAVENOUS | Status: DC | PRN
  Administered 2022-08-21: 1000 mg

## 2022-08-21 MED ORDER — ONDANSETRON HCL 4 MG/2ML IJ SOLN
INTRAMUSCULAR | Status: AC
  Filled 2022-08-21: qty 2

## 2022-08-21 MED ORDER — EPHEDRINE 5 MG/ML INJ
INTRAVENOUS | Status: AC
  Filled 2022-08-21: qty 5

## 2022-08-21 MED ORDER — PHENYLEPHRINE 80 MCG/ML (10ML) SYRINGE FOR IV PUSH (FOR BLOOD PRESSURE SUPPORT)
PREFILLED_SYRINGE | INTRAVENOUS | Status: DC | PRN
  Administered 2022-08-21: 240 ug via INTRAVENOUS
  Administered 2022-08-21: 400 ug via INTRAVENOUS

## 2022-08-21 MED ORDER — METOPROLOL SUCCINATE ER 50 MG PO TB24
50.0000 mg | ORAL_TABLET | Freq: Every day | ORAL | Status: DC
  Administered 2022-08-21 – 2022-08-24 (×4): 50 mg via ORAL
  Filled 2022-08-21 (×5): qty 1

## 2022-08-21 MED ORDER — MUPIROCIN 2 % EX OINT
1.0000 | TOPICAL_OINTMENT | Freq: Two times a day (BID) | CUTANEOUS | Status: DC
  Administered 2022-08-22 – 2022-08-24 (×5): 1 via NASAL
  Filled 2022-08-21 (×3): qty 22

## 2022-08-21 MED ORDER — FENTANYL CITRATE (PF) 250 MCG/5ML IJ SOLN
INTRAMUSCULAR | Status: AC
  Filled 2022-08-21: qty 5

## 2022-08-21 MED ORDER — FENTANYL CITRATE (PF) 100 MCG/2ML IJ SOLN
INTRAMUSCULAR | Status: AC
  Filled 2022-08-21: qty 2

## 2022-08-21 MED ORDER — CEFAZOLIN SODIUM-DEXTROSE 2-4 GM/100ML-% IV SOLN
2.0000 g | INTRAVENOUS | Status: AC
  Administered 2022-08-21: 2 g via INTRAVENOUS

## 2022-08-21 MED ORDER — ACETAMINOPHEN 325 MG PO TABS
325.0000 mg | ORAL_TABLET | Freq: Four times a day (QID) | ORAL | Status: DC | PRN
  Administered 2022-08-23 – 2022-08-24 (×3): 650 mg via ORAL
  Filled 2022-08-21 (×3): qty 2

## 2022-08-21 MED ORDER — SUGAMMADEX SODIUM 200 MG/2ML IV SOLN
INTRAVENOUS | Status: DC | PRN
  Administered 2022-08-21: 200 mg via INTRAVENOUS

## 2022-08-21 MED ORDER — LIP MEDEX EX OINT
TOPICAL_OINTMENT | CUTANEOUS | Status: DC | PRN
  Filled 2022-08-21: qty 7

## 2022-08-21 MED ORDER — 0.9 % SODIUM CHLORIDE (POUR BTL) OPTIME
TOPICAL | Status: DC | PRN
  Administered 2022-08-21: 1000 mL

## 2022-08-21 MED ORDER — PROPOFOL 10 MG/ML IV BOLUS
INTRAVENOUS | Status: DC | PRN
  Administered 2022-08-21: 60 mg via INTRAVENOUS

## 2022-08-21 MED ORDER — BLISTEX MEDICATED EX OINT: TOPICAL_OINTMENT | CUTANEOUS | Status: DC | PRN

## 2022-08-21 MED ORDER — METOCLOPRAMIDE HCL 5 MG PO TABS: 5.0000 mg | ORAL_TABLET | Freq: Three times a day (TID) | ORAL | Status: DC | PRN

## 2022-08-21 MED ORDER — PHENYLEPHRINE 80 MCG/ML (10ML) SYRINGE FOR IV PUSH (FOR BLOOD PRESSURE SUPPORT)
PREFILLED_SYRINGE | INTRAVENOUS | Status: AC
  Filled 2022-08-21: qty 10

## 2022-08-21 MED ORDER — ONDANSETRON HCL 4 MG/2ML IJ SOLN
INTRAMUSCULAR | Status: DC | PRN
  Administered 2022-08-21: 4 mg via INTRAVENOUS

## 2022-08-21 MED ORDER — CEFAZOLIN SODIUM-DEXTROSE 2-4 GM/100ML-% IV SOLN
INTRAVENOUS | Status: AC
  Filled 2022-08-21: qty 100

## 2022-08-21 MED ORDER — PROPOFOL 10 MG/ML IV BOLUS
INTRAVENOUS | Status: AC
  Filled 2022-08-21: qty 20

## 2022-08-21 MED ORDER — DILTIAZEM HCL-DEXTROSE 125-5 MG/125ML-% IV SOLN (PREMIX)
5.0000 mg/h | INTRAVENOUS | Status: DC
  Administered 2022-08-21: 5 mg/h via INTRAVENOUS
  Filled 2022-08-21: qty 125

## 2022-08-21 MED ORDER — CHLORHEXIDINE GLUCONATE 4 % EX LIQD: 60.0000 mL | Freq: Once | CUTANEOUS | Status: DC

## 2022-08-21 MED ORDER — LACTATED RINGERS IV SOLN: INTRAVENOUS | Status: DC

## 2022-08-21 MED ORDER — MORPHINE SULFATE (PF) 2 MG/ML IV SOLN: 0.5000 mg | INTRAVENOUS | Status: DC | PRN

## 2022-08-21 MED ORDER — DEXAMETHASONE SODIUM PHOSPHATE 10 MG/ML IJ SOLN
INTRAMUSCULAR | Status: DC | PRN
  Administered 2022-08-21: 5 mg via INTRAVENOUS

## 2022-08-21 MED ORDER — ROCURONIUM BROMIDE 10 MG/ML (PF) SYRINGE
PREFILLED_SYRINGE | INTRAVENOUS | Status: AC
  Filled 2022-08-21: qty 10

## 2022-08-21 MED ORDER — ACETAMINOPHEN 500 MG PO TABS: 1000.0000 mg | ORAL_TABLET | Freq: Once | ORAL | Status: AC

## 2022-08-21 MED ORDER — LIDOCAINE 2% (20 MG/ML) 5 ML SYRINGE
INTRAMUSCULAR | Status: AC
  Filled 2022-08-21: qty 5

## 2022-08-21 MED ORDER — DEXAMETHASONE SODIUM PHOSPHATE 10 MG/ML IJ SOLN
INTRAMUSCULAR | Status: AC
  Filled 2022-08-21: qty 1

## 2022-08-21 MED ORDER — ORAL CARE MOUTH RINSE: 15.0000 mL | Freq: Once | OROMUCOSAL | Status: AC

## 2022-08-21 MED ORDER — DOCUSATE SODIUM 100 MG PO CAPS: 100.0000 mg | ORAL_CAPSULE | Freq: Every evening | ORAL | Status: DC | PRN

## 2022-08-21 MED ORDER — HYDROCODONE-ACETAMINOPHEN 7.5-325 MG PO TABS
1.0000 | ORAL_TABLET | ORAL | Status: DC | PRN
  Administered 2022-08-23: 1 via ORAL
  Administered 2022-08-23 – 2022-08-25 (×4): 2 via ORAL
  Filled 2022-08-21 (×2): qty 2
  Filled 2022-08-21: qty 1
  Filled 2022-08-21 (×2): qty 2

## 2022-08-21 MED ORDER — POLYETHYLENE GLYCOL 3350 17 G PO PACK: 17.0000 g | PACK | Freq: Every day | ORAL | Status: DC | PRN

## 2022-08-21 MED ORDER — ONDANSETRON HCL 4 MG/2ML IJ SOLN: 4.0000 mg | Freq: Four times a day (QID) | INTRAMUSCULAR | Status: DC | PRN

## 2022-08-21 MED ORDER — ACETAMINOPHEN 500 MG PO TABS
ORAL_TABLET | ORAL | Status: AC
  Administered 2022-08-21: 1000 mg via ORAL
  Filled 2022-08-21: qty 2

## 2022-08-21 MED ORDER — FENTANYL CITRATE (PF) 100 MCG/2ML IJ SOLN
25.0000 ug | INTRAMUSCULAR | Status: DC | PRN
  Administered 2022-08-21: 50 ug via INTRAVENOUS

## 2022-08-21 MED ORDER — CHLORHEXIDINE GLUCONATE 0.12 % MT SOLN
15.0000 mL | Freq: Once | OROMUCOSAL | Status: AC
  Administered 2022-08-21: 15 mL via OROMUCOSAL
  Filled 2022-08-21: qty 15

## 2022-08-21 SURGICAL SUPPLY — 66 items
ADH SKN CLS APL DERMABOND .7 (GAUZE/BANDAGES/DRESSINGS) ×1
APL PRP STRL LF DISP 70% ISPRP (MISCELLANEOUS) ×2
BAG COUNTER SPONGE SURGICOUNT (BAG) ×1 IMPLANT
BAG SPNG CNTER NS LX DISP (BAG) ×1
BIT DRILL 4.3 (BIT) ×1
BIT DRILL 4.3X300MM (BIT) IMPLANT
BIT DRILL LONG 3.3 (BIT) IMPLANT
BIT DRILL QC 3.3X195 (BIT) IMPLANT
BNDG ELASTIC 4X5.8 VLCR STR LF (GAUZE/BANDAGES/DRESSINGS) IMPLANT
BNDG ELASTIC 6X5.8 VLCR STR LF (GAUZE/BANDAGES/DRESSINGS) IMPLANT
BRUSH SCRUB EZ PLAIN DRY (MISCELLANEOUS) ×2 IMPLANT
CANISTER SUCT 3000ML PPV (MISCELLANEOUS) ×1 IMPLANT
CAP LOCK NCB (Cap) IMPLANT
CHLORAPREP W/TINT 26 (MISCELLANEOUS) ×1 IMPLANT
COVER SURGICAL LIGHT HANDLE (MISCELLANEOUS) ×1 IMPLANT
DERMABOND ADVANCED .7 DNX12 (GAUZE/BANDAGES/DRESSINGS) IMPLANT
DRAPE C-ARM 42X72 X-RAY (DRAPES) ×1 IMPLANT
DRAPE C-ARMOR (DRAPES) ×1 IMPLANT
DRAPE HALF SHEET 40X57 (DRAPES) ×2 IMPLANT
DRAPE ORTHO SPLIT 77X108 STRL (DRAPES) ×2
DRAPE SURG 17X23 STRL (DRAPES) ×1 IMPLANT
DRAPE SURG ORHT 6 SPLT 77X108 (DRAPES) ×2 IMPLANT
DRAPE U-SHAPE 47X51 STRL (DRAPES) ×1 IMPLANT
DRSG MEPILEX POST OP 4X8 (GAUZE/BANDAGES/DRESSINGS) IMPLANT
ELECT REM PT RETURN 9FT ADLT (ELECTROSURGICAL) ×1
ELECTRODE REM PT RTRN 9FT ADLT (ELECTROSURGICAL) ×1 IMPLANT
GLOVE BIO SURGEON STRL SZ 6.5 (GLOVE) ×3 IMPLANT
GLOVE BIO SURGEON STRL SZ7.5 (GLOVE) ×4 IMPLANT
GLOVE BIOGEL PI IND STRL 6.5 (GLOVE) ×1 IMPLANT
GLOVE BIOGEL PI IND STRL 7.5 (GLOVE) ×1 IMPLANT
GOWN STRL REUS W/ TWL LRG LVL3 (GOWN DISPOSABLE) ×3 IMPLANT
GOWN STRL REUS W/TWL LRG LVL3 (GOWN DISPOSABLE) ×3
K-WIRE 2.0 (WIRE) ×3
K-WIRE FXSTD 280X2XNS SS (WIRE) ×3
KIT BASIN OR (CUSTOM PROCEDURE TRAY) ×1 IMPLANT
KIT TURNOVER KIT B (KITS) ×1 IMPLANT
KWIRE FXSTD 280X2XNS SS (WIRE) IMPLANT
NS IRRIG 1000ML POUR BTL (IV SOLUTION) ×1 IMPLANT
PACK TOTAL JOINT (CUSTOM PROCEDURE TRAY) ×1 IMPLANT
PAD ARMBOARD 7.5X6 YLW CONV (MISCELLANEOUS) ×2 IMPLANT
PAD CAST 4YDX4 CTTN HI CHSV (CAST SUPPLIES) ×1 IMPLANT
PADDING CAST COTTON 4X4 STRL (CAST SUPPLIES) ×1
PADDING CAST COTTON 6X4 STRL (CAST SUPPLIES) IMPLANT
PLATE FEM DIST NCB PP 278MM (Plate) IMPLANT
SCREW 5.0 80MM (Screw) IMPLANT
SCREW CORT NCB SELFTAP 5.0X42 (Screw) IMPLANT
SCREW CORTICAL NCB 5.0X65 (Screw) IMPLANT
SCREW NCB 3.5X75X5X6.2XST (Screw) IMPLANT
SCREW NCB 4.0 32MM (Screw) IMPLANT
SCREW NCB 4.0MX50M (Screw) IMPLANT
SCREW NCB 4.0X36MM (Screw) IMPLANT
SCREW NCB 5.0X36MM (Screw) IMPLANT
SCREW NCB 5.0X75MM (Screw) ×2 IMPLANT
SPONGE T-LAP 18X18 ~~LOC~~+RFID (SPONGE) IMPLANT
STAPLER VISISTAT 35W (STAPLE) ×1 IMPLANT
SUCTION FRAZIER HANDLE 10FR (MISCELLANEOUS) ×1
SUCTION TUBE FRAZIER 10FR DISP (MISCELLANEOUS) ×1 IMPLANT
SUT MNCRL AB 3-0 PS2 27 (SUTURE) IMPLANT
SUT VIC AB 0 CT1 27 (SUTURE) ×1
SUT VIC AB 0 CT1 27XBRD ANBCTR (SUTURE) IMPLANT
SUT VIC AB 1 CT1 27 (SUTURE)
SUT VIC AB 1 CT1 27XBRD ANBCTR (SUTURE) IMPLANT
SUT VIC AB 2-0 CT1 27 (SUTURE) ×3
SUT VIC AB 2-0 CT1 TAPERPNT 27 (SUTURE) ×2 IMPLANT
TOWEL GREEN STERILE (TOWEL DISPOSABLE) ×2 IMPLANT
WATER STERILE IRR 1000ML POUR (IV SOLUTION) ×2 IMPLANT

## 2022-08-21 NOTE — Progress Notes (Signed)
Patient arrived in short stay; alert and oriented x 4; patient on room air - Sat O2 77%; Pulse 130; BP 111/57; Patient received oxygen 2.5 L per [place] and sat O2 is 96%. Patient received Cardizem 120 mg po per order and an EKG was done per Dr. Ermalene Postin verbal order. Will continue to monitor.

## 2022-08-21 NOTE — Progress Notes (Signed)
Orthopedic Tech Progress Note Patient Details:  Carrie Barnes 12/19/1935 [id number]  Musculoskeletal Traction Type of Traction: Bucks Skin Traction Traction Location: RLE Traction Weight: 10 lbs   Post Interventions Patient Tolerated: Well  Vernona Rieger 08/21/2022, 2:22 AM

## 2022-08-21 NOTE — Op Note (Signed)
Orthopaedic Surgery Operative Note (CSN: [id number] ) Date of Surgery: 08/21/2022  Admit Date: 08/20/2022   Diagnoses: Pre-Op Diagnoses: Right supracondylar distal femur fracture with intracondylar extension  Post-Op Diagnosis: Same  Procedures: CPT 27513-Open reduction internal fixation of right distal femur fracture CPT 20680-Removal of hardware from right femur  Surgeons : Primary: Shona Needles, MD  Assistant: Patrecia Pace, PA-C  Location: OR 3   Anesthesia: General   Antibiotics: Ancef 2g preop with 1 gm vancomycin powder placed topically   Tourniquet time: None    Estimated Blood Loss: 75 mL  Complications: None   Specimens: None   Implants: Implant Name Type Inv. Item Serial No. Manufacturer Lot No. LRB No. Used Action  PLATE FEM DIST NCB PP 278MM - [id number] Plate PLATE FEM DIST NCB PP 278MM  [employer](ORTH,TRAU,BIO,SG)  Right 1 Implanted  SCREW CORTICAL NCB 5.0X65 - [id number] Screw SCREW CORTICAL NCB 5.0X65  [employer](ORTH,TRAU,BIO,SG)  Right 1 Implanted  SCREW NCB 5.0X75MM - [id number] Screw SCREW NCB 5.0X75MM  [employer](ORTH,TRAU,BIO,SG)  Right 2 Implanted  SCREW CORT NCB SELFTAP 5.0X42 - [id number] Screw SCREW CORT NCB SELFTAP 5.0X42  [employer](ORTH,TRAU,BIO,SG)  Right 1 Implanted  SCREW NCB 5.0X36MM - [id number] Screw SCREW NCB 5.0X36MM  [employer](ORTH,TRAU,BIO,SG)  Right 1 Implanted  SCREW 5.0 80MM - [id number] Screw SCREW 5.0 80MM  [employer](ORTH,TRAU,BIO,SG)  Right 2 Implanted  SCREW NCB 4.0MX50M - [id number] Screw SCREW NCB 4.0MX50M  [employer](ORTH,TRAU,BIO,SG)  Right 1 Implanted  SCREW NCB 4.0X36MM - [id number] Screw SCREW NCB 4.0X36MM  [employer](ORTH,TRAU,BIO,SG)  Right 1 Implanted  SCREW NCB 4.0 32MM - [id number] Screw SCREW NCB 4.0 32MM  [employer](ORTH,TRAU,BIO,SG)  Right 1 Implanted  CAP LOCK NCB - [id number] Cap CAP LOCK NCB  [employer](ORTH,TRAU,BIO,SG)  Right 7 Implanted     Indications for Surgery: 87 year old  female who sustained a ground-level fall and has a right supracondylar distal femur fracture with intra-articular extension.  Due to the unstable nature of her injury I recommended proceeding with open reduction internal fixation.  Risk and benefits were discussed with the patient.  Risks included but not limited to bleeding, infection, malunion, nonunion, hardware failure, hardware irritation, nerve or blood vessel injury, posttraumatic arthritis, knee stiffness, even the possibility anesthetic complications.  She agreed to proceed with surgery and consent was obtained.  Operative Findings: 1.  Open reduction internal fixation of right supracondylar distal femur fracture with intercondylar extension using [employer] NCB distal femoral locking plate. 2.  Removal of distal interlocking screw from short hip nail that was previously placed.  Procedure: The patient was identified in the preoperative holding area. Consent was confirmed with the patient and their family and all questions were answered. The operative extremity was marked after confirmation with the patient. she was then brought back to the operating room by our anesthesia colleagues.  She was placed under general anesthetic and carefully transferred over to radiolucent flattop table.  A bump was placed under her operative hip.  The right lower extremity was then prepped and draped in usual sterile fashion.  A timeout was performed to verify the patient, the procedure, and the extremity.  Preoperative antibiotics were dosed.  Fluoroscopic imaging was obtained to show the unstable nature of her injury.  The hip and knee were flexed over a triangle.  A lateral parapatellar incision was then made and carried down through skin and subcutaneous tissue.  I incised through the IT band to expose the lateral condyle of the femur.  I  then entered the knee joint by extending the incision proximally through portion of the vastus lateralis.  I was able to  see the articular split cleaned out the hematoma and then proceeded to manipulate the condyles back into reduction and then used a reduction tenaculum to anatomically reduce the intercondylar split.  Another clamp was placed more proximally to compress and reduce the metaphyseal extension of the splint.  I confirmed reduction and positioning of the clamps using fluoroscopy.  I then placed 2.0 mm guidewires from lateral to medial and bottom through the medial skin.  The brought him enough to where the K wires were flush to the lateral cortex of the lateral condyle.  I then proceeded to drill and placed a 4.0 millimeter screw into the metaphysis to hold the intracondylar reduction.  I then was able to remove the clamps.  At this point I then percutaneously remove the distal interlocking screw from the short hip nail to make sure that it was not in the way of the plate.  Reduction was performed using traction and the triangle to align the metaphysis appropriately.  I then placed a 12 hole [employer] NCB plate to a targeting arm and slid this submuscularly along the lateral cortex of the femur.  I held the distal portion in place with a 2.0 mm K wire.  I then percutaneously placed a 3.3 mm drill bit through the targeting arm getting bicortical purchase just below the hip nail.  I then drilled and placed a 5.0 millimeter screws distally to bring the plate flush to bone.  I then percutaneously placed 5.0 millimeter screws into the femoral shaft to correct the coronal alignment.  I then removed the 3.3 mm drill bit and initially placed proximally and placed a 4.0 millimeters screw.  I then was able to drill bicortically anterior to the nail and placed a 4.0 millimeter screw.  Locking caps were placed on the to middle screws in the femoral shaft.  And then I removed the targeting arm and placed 3 more 5.0 millimeter screws after I removed the 2.0 mm K wires that were provisionally holding the intra-articular  reduction.  Locking caps were placed in all of the distal interlocking screws.  Final fluoroscopic imaging was obtained.  The incision was copiously irrigated.  A gram of vancomycin powder was placed into the incision.  A layered closure of 0 Vicryl, 2-0 Vicryl, 3-0 Monocryl and Dermabond was used to close the skin.  Sterile dressings were applied.  Patient was then awoke from anesthesia in stable condition.  Post Op Plan/Instructions: The patient will be touchdown weightbearing to the right lower extremity.  She will receive postoperative Ancef.  She will be restarted on Eliquis likely postoperative day 1 if her hemoglobin remained stable.  Will have her mobilize with physical and Occupational Therapy.  She will have unrestricted range of motion of the knee.  No bracing is needed.  I was present and performed the entire surgery.  Patrecia Pace, PA-C did assist me throughout the case. An assistant was necessary given the difficulty in approach, maintenance of reduction and ability to instrument the fracture.   Katha Hamming, MD [facility]

## 2022-08-21 NOTE — Anesthesia Preprocedure Evaluation (Addendum)
Anesthesia Evaluation  Patient identified by MRN, date of birth, ID band Patient awake    Reviewed: Allergy & Precautions, NPO status , Patient's Chart, lab work & pertinent test results  History of Anesthesia Complications Negative for: history of anesthetic complications  Airway Mallampati: III  TM Distance: >3 FB Neck ROM: Full    Dental  (+) Edentulous Upper, Edentulous Lower, Dental Advisory Given   Pulmonary shortness of breath, neg sleep apnea, neg COPD, neg recent URI, former smoker   breath sounds clear to auscultation       Cardiovascular hypertension, Pt. on medications and Pt. on home beta blockers (-) angina + CAD, + Past MI and + Cardiac Stents  + dysrhythmias Atrial Fibrillation and Supra Ventricular Tachycardia  Rhythm:Regular Rate:Tachycardia     Neuro/Psych negative neurological ROS  negative psych ROS   GI/Hepatic Neg liver ROS,GERD  ,,  Endo/Other  negative endocrine ROS  No results found for: "HGBA1C"   Renal/GU negative Renal ROSLab Results      Component                Value               Date                      CREATININE               0.81                08/21/2022           Lab Results      Component                Value               Date                      K                        3.8                 08/21/2022                Musculoskeletal  (+) Arthritis ,  distal femur fracture   Abdominal   Peds  Hematology  (+) Blood dyscrasia, anemia Lab Results      Component                Value               Date                      WBC                      14.6 (H)            08/21/2022                HGB                      8.7 (L)             08/21/2022                HCT                      26.9 (L)  08/21/2022                MCV                      90.3                08/21/2022                PLT                      379                 08/21/2022               Anesthesia Other Findings   Reproductive/Obstetrics                             Anesthesia Physical Anesthesia Plan  ASA: 3  Anesthesia Plan: General   Post-op Pain Management:    Induction: Intravenous  PONV Risk Score and Plan: 3 and Ondansetron and Dexamethasone  Airway Management Planned: Oral ETT  Additional Equipment: None  Intra-op Plan:   Post-operative Plan: Extubation in OR  Informed Consent: I have reviewed the patients History and Physical, chart, labs and discussed the procedure including the risks, benefits and alternatives for the proposed anesthesia with the patient or authorized representative who has indicated his/her understanding and acceptance.     Dental advisory given  Plan Discussed with: CRNA  Anesthesia Plan Comments:        Anesthesia Quick Evaluation

## 2022-08-21 NOTE — Transfer of Care (Signed)
Immediate Anesthesia Transfer of Care Note  Patient: Carrie Barnes  Procedure(s) Performed: RIGHT OPEN REDUCTION INTERNAL FIXATION (ORIF) DISTAL FEMUR FRACTURE (Right)  Patient Location: PACU  Anesthesia Type:General  Level of Consciousness: awake, alert , and oriented  Airway & Oxygen Therapy: Patient Spontanous Breathing and Patient connected to nasal cannula oxygen  Post-op Assessment: Report given to RN, Post -op Vital signs reviewed and stable, and Patient moving all extremities  Post vital signs: Reviewed  Last Vitals:  Vitals Value Taken Time  BP 106/92 08/21/22 1202  Temp    Pulse 175 08/21/22 1207  Resp 15 08/21/22 1207  SpO2 80 % 08/21/22 1207  Vitals shown include unvalidated device data.  Last Pain:  Vitals:   08/21/22 0838  TempSrc: Oral  PainSc: 6          Complications: No notable events documented.

## 2022-08-21 NOTE — Anesthesia Procedure Notes (Signed)
Procedure Name: Intubation Date/Time: 08/21/2022 10:05 AM  Performed by: Barrington Ellison, CRNAPre-anesthesia Checklist: Patient identified, Emergency Drugs available, Suction available and Patient being monitored Patient Re-evaluated:Patient Re-evaluated prior to induction Oxygen Delivery Method: Circle System Utilized Preoxygenation: Pre-oxygenation with 100% oxygen Induction Type: IV induction Ventilation: Mask ventilation without difficulty Laryngoscope Size: Mac and 3 Grade View: Grade I Tube type: Oral Tube size: 7.0 mm Number of attempts: 1 Airway Equipment and Method: Stylet and Oral airway Placement Confirmation: ETT inserted through vocal cords under direct vision, positive ETCO2 and breath sounds checked- equal and bilateral Secured at: 22 cm Tube secured with: Tape Dental Injury: Teeth and Oropharynx as per pre-operative assessment

## 2022-08-21 NOTE — Progress Notes (Signed)
PROGRESS NOTE  Carrie Barnes  DOB: 1935/10/28  PCP: Seward Carol, MD ELF:[id number]  DOA: 08/20/2022  LOS: 1 day  Hospital Day: 2  Brief narrative: Carrie Barnes is a 87 y.o. female with PMH significant for HTN, HLD, A-fib on Eliquis, CAD/stents, ischemic cardiomyopathy, GERD, DJD who usually walks with a walker  1/10, patient had a mechanical fall onto her right knee with immediate severe pain and inability to bear weight. Imaging in the ED showed right intra-articular distal femoral fracture.  Orthopedics was consulted Admitted to [facility] 1/11, underwent ORIF of right distal femur fracture  Subjective: Patient was seen and examined this afternoon in PACU. Chart reviewed Patient was tachycardic overnight and was in 120s this morning Patient was in OR around 8 AM before I could see her It seems intraoperatively, she went to A-fib with RVR with a heart rate upto 180s and was started on Cardizem drip. By the time my evaluation, patient had converted to normal sinus rhythm with heart rate in 90s and Cardizem drip was running at 5 mg/h.  Assessment and plan: Right intra-articular distal femur fracture S/p ORIF by orthopedics 1/11 Pain management and DVT prophylaxis per Ortho  A-fib with RVR On Cardizem drip PTA on Cardizem 120 mg daily and Toprol 50 mg at bedtime.  Resume both. Chronically anticoagulated with Eliquis.  Currently on hold  CAD/HLD No anginal symptoms.  Continue statin  Essential hypertension Blood pressure was low during RVR this afternoon.   Continue IV hydration Monitor on Cardizem and metoprolol.  Hyponatremia Mild.  Continue to monitor Recent Labs  Lab 08/20/22 1602 08/21/22 0322  NA 129* 130*   Mobility: PT eval pending  Goals of care   Code Status: Full Code    Scheduled Meds:  atorvastatin  80 mg Oral QHS   diltiazem  120 mg Oral Daily   fentaNYL       metoprolol succinate  50 mg Oral QHS   mupirocin ointment  1 Application Nasal BID     PRN meds: docusate sodium, fentaNYL   Infusions:   sodium chloride      Skin assessment:     Nutritional status:  Body mass index is 27.82 kg/m.          Diet:  Diet Order             Diet Heart Room service appropriate? Yes; Fluid consistency: Thin  Diet effective now                   DVT prophylaxis:  Place TED hose Start: 08/21/22 0102   Antimicrobials: None Fluid: NS at 16 mill per hour Consultants: Orthopedics Family Communication: None at bedside  Status is: Inpatient  Continue in-hospital care because: POD 0 Level of care: Progressive   Dispo: The patient is from: Home              Anticipated d/c is to: Pending clinical course              Patient currently is not medically stable to d/c.   Difficult to place patient No    Antimicrobials: Anti-infectives (From admission, onward)    Start     Dose/Rate Route Frequency Ordered Stop   08/21/22 1045  vancomycin (VANCOCIN) powder  Status:  Discontinued          As needed 08/21/22 1046 08/21/22 1157   08/21/22 0915  ceFAZolin (ANCEF) IVPB 2g/100 mL premix        2  g 200 mL/hr over 30 Minutes Intravenous On call to O.R. 08/21/22 0816 08/21/22 1020   08/21/22 0812  ceFAZolin (ANCEF) 2-4 GM/100ML-% IVPB       Note to Pharmacy: [facility], [name]: cabinet override      08/21/22 0812 08/21/22 1015       Objective: Vitals:   08/21/22 1615 08/21/22 1646  BP: (!) 105/53   Pulse: 97   Resp: 20   Temp: 98.1 F (36.7 C) 98.3 F (36.8 C)  SpO2: 95%     Intake/Output Summary (Last 24 hours) at 08/21/2022 1653 Last data filed at 08/21/2022 1434 Gross per 24 hour  Intake 2914 ml  Output 75 ml  Net 2839 ml   Filed Weights   08/20/22 1530  Weight: 69 kg   Weight change:  Body mass index is 27.82 kg/m.   Physical Exam: General exam: Pleasant, elderly Caucasian female.  Not in physical distress Skin: No rashes, lesions or ulcers. HEENT: Atraumatic, normocephalic, no obvious  bleeding Lungs: Clear to auscultation bilaterally CVS: Regular rate and rhythm, no murmur GI/Abd soft, nontender, nondistended, bowel sound present CNS: Alert, awake, oriented x 3 Psychiatry: Mood appropriate Extremities: No pedal edema, no calf tenderness  Data Review: I have personally reviewed the laboratory data and studies available.  F/u labs ordered [employer] (From admission, onward)     Start     Ordered   Signed and Held  VITAMIN D 25 Hydroxy (Vit-D Deficiency, Fractures)  Once,   R        Signed and Held   Signed and Held  CBC  Daily,   R      Signed and Held   Signed and Held  Basic metabolic panel  Tomorrow morning,   R        Signed and Held            Total time spent in review of labs and imaging, patient evaluation, formulation of plan, documentation and communication with family -4 minutes  Signed, Terrilee Croak, MD [facility] 08/21/2022

## 2022-08-21 NOTE — Interval H&P Note (Signed)
History and Physical Interval Note:  08/21/2022 9:15 AM  Carrie Barnes  has presented today for surgery, with the diagnosis of distal femur fracture.  The various methods of treatment have been discussed with the patient and family. After consideration of risks, benefits and other options for treatment, the patient has consented to  Procedure(s): RIGHT OPEN REDUCTION INTERNAL FIXATION (ORIF) DISTAL FEMUR FRACTURE (Right) as a surgical intervention.  The patient's history has been reviewed, patient examined, no change in status, stable for surgery.  I have reviewed the patient's chart and labs.  Questions were answered to the patient's satisfaction.     Lennette Bihari P Claudeen Leason

## 2022-08-21 NOTE — Consult Note (Signed)
Orthopaedic Trauma Service (OTS) Consult   Patient ID: Carrie Barnes MRN: [id number] DOB/AGE: 12/17/1935 86 y.o.  Reason for Consult:Right distal femur fracture Referring Physician: Dr. Josh Landau, MD [facility]  HPI: Carrie Barnes is an 86 y.o. female who is being seen in consultation at the request of Dr. Landau for evaluation of right distal femur fracture.  Patient is known to me from a previous fall with a right acetabular fracture that underwent open reduction internal fixation also had a comminuted right proximal humerus fracture that underwent reverse total arthroplasty with Dr. Rogers.  Patient had a fall sustaining a right distal femur fracture.  I have not recently seen her in the office as she had to postpone her appointment secondary to newly diagnosed atrial fibrillation.  Currently not have any symptoms.  Denies any pain other than her right leg.  She was doing well with her walking and was ambulating with the use of a walker. Patient was seen and evaluated in the preoperative holding area.  No family is at bedside but patient is able to converse and answer questions appropriately.    Past Medical History:  Diagnosis Date   Cholecystitis    Coronary artery disease    a. 03/2017: 80-90% LAD stenosis (PCI/DES placement with a 2.75x16 mm Promus Premier stent). No significant stenosis along RCA or LCx.    DJD (degenerative joint disease)    GERD (gastroesophageal reflux disease)    Hyperlipidemia    Hypertension    dx s/p MI    Ischemic cardiomyopathy    Myocardial infarction (HCC) 03/17/2017   03-2017 treated at [facility]    Osteoporosis    Status post insertion of drug-eluting stent into left anterior descending (LAD) artery 03/2017    Past Surgical History:  Procedure Laterality Date   APPENDECTOMY     CESAREAN SECTION     CHOLECYSTECTOMY N/A 11/05/2017   Procedure: LAPAROSCOPIC CHOLECYSTECTOMY WITH INTRAOPERATIVE CHOLANGIOGRAM;   Surgeon: Gerkin, Todd, MD;  Location: [facility];  Service: General;  Laterality: N/A;   ERCP N/A 11/11/2017   Procedure: ENDOSCOPIC RETROGRADE CHOLANGIOPANCREATOGRAPHY (ERCP);  Surgeon: Outlaw, William, MD;  Location: [facility];  Service: Endoscopy;  Laterality: N/A;  possible   EUS N/A 11/11/2017   Procedure: UPPER ENDOSCOPIC ULTRASOUND (EUS) RADIAL;  Surgeon: Outlaw, William, MD;  Location: [facility];  Service: Endoscopy;  Laterality: N/A;   FEMUR IM NAIL Right 07/13/2019   Procedure: INTRAMEDULLARY (IM) RETROGRADE FEMORAL NAILING;  Surgeon: Aluisio, Frank, MD;  Location: [facility];  Service: Orthopedics;  Laterality: Right;  60min   LUMBAR LAMINECTOMY/ DECOMPRESSION WITH MET-RX     OPEN REDUCTION INTERNAL FIXATION ACETABULAR FRACTURE STOPPA Right 05/14/2022   Procedure: OPEN REDUCTION INTERNAL FIXATION RIGHT ACETABULUM;  Surgeon: Melecio Cueto P, MD;  Location: [facility] OR;  Service: Orthopedics;  Laterality: Right;   REVERSE SHOULDER ARTHROPLASTY Right 05/15/2022   Procedure: RIGHT REVERSE SHOULDER ARTHROPLASTY;  Surgeon: Rogers, Jason Patrick, MD;  Location: [facility] OR;  Service: Orthopedics;  Laterality: Right;   SHOULDER ARTHROSCOPY WITH ROTATOR CUFF REPAIR AND SUBACROMIAL DECOMPRESSION Right 09/20/2021   Procedure: SHOULDER MINI OPEN ROTATOR CUFF REPAIR AND SUBACROMIAL DECOMPRESSION WITH POSSIBLE PATCH GRAFT;  Surgeon: Beane, Jeffrey, MD;  Location: [facility];  Service: Orthopedics;  Laterality: Right;  90 MINS   stent  Left 03/2017   anterior descending ; drug eluting ; tx of MI at [facility]    TONSILLECTOMY     VAGINAL HYSTERECTOMY       vaginal sling      Family History  Problem Relation Age of Onset   Heart disease Father    Breast cancer Sister    Pancreatic cancer Sister     Social History:  reports that she has quit smoking. Her smoking use included cigarettes. She has a 30.00 pack-year smoking history. She has never used smokeless tobacco. She reports current alcohol use.  She reports that she does not use drugs.  Allergies: No Known Allergies  Medications:  Current Facility-Administered Medications on File Prior to Encounter  Medication Dose Route Frequency Provider Last Rate Last Admin   indomethacin (INDOCIN) 50 MG suppository 50 mg  50 mg Rectal Once Arta Silence, MD       Current Outpatient Medications on File Prior to Encounter  Medication Sig Dispense Refill   acetaminophen (TYLENOL) 500 MG tablet Take 1,000 mg by mouth See admin instructions. Take 1,000 mg by mouth at bedtime and an additional 1,000 mg once a day as needed for pain     apixaban (ELIQUIS) 5 MG TABS tablet Take 1 tablet (5 mg total) by mouth 2 (two) times daily. 60 tablet 1   ascorbic acid (VITAMIN C) 500 MG tablet Take 500 mg by mouth daily.     aspirin EC 81 MG tablet Take 81 mg by mouth in the morning. Swallow whole.     atorvastatin (LIPITOR) 80 MG tablet Take 1 tablet (80 mg total) by mouth daily. (Patient taking differently: Take 80 mg by mouth at bedtime.) 90 tablet 3   Calcium Carb-Cholecalciferol (CALCIUM 600/VITAMIN D PO) Take 1 tablet by mouth in the morning and at bedtime.     cetirizine (ZYRTEC) 10 MG tablet Take 10 mg by mouth daily.     Cholecalciferol (VITAMIN D3) 2000 units capsule Take 1,000-2,000 Units by mouth daily.     CRANBERRY EXTRACT PO Take 25,000 mg by mouth 2 (two) times daily.     diltiazem (CARDIZEM CD) 120 MG 24 hr capsule Take 1 capsule (120 mg total) by mouth daily. 90 capsule 3   docusate sodium (COLACE) 100 MG capsule Take 1 capsule (100 mg total) by mouth 2 (two) times daily as needed for mild constipation. (Patient taking differently: Take 100 mg by mouth at bedtime as needed for mild constipation.) 30 capsule 1   latanoprost (XALATAN) 0.005 % ophthalmic solution Place 1 drop into both eyes at bedtime.     methenamine (HIPREX) 1 g tablet Take 1 g by mouth 2 (two) times daily with a meal.     metoprolol succinate (TOPROL-XL) 50 MG 24 hr tablet Take  25-50 mg by mouth See admin instructions. Take 50 mg by mouth at bedtime and an additional 25 mg once daily as needed for A-Fib. Take with or immediately following a meal.     Multiple Vitamin (MULTI-VITAMINS) TABS Take 1 tablet by mouth daily with breakfast.     omeprazole (PRILOSEC) 20 MG capsule Take 20 mg by mouth daily before breakfast.     polyethylene glycol (MIRALAX / GLYCOLAX) 17 g packet Take 17 g by mouth daily. 14 each 0   Probiotic Product (TRUNATURE DIGESTIVE PROBIOTIC) CAPS Take 1 capsule by mouth daily.     protein supplement shake (PREMIER PROTEIN) LIQD Take 11 oz by mouth daily.     senna (SENOKOT) 8.6 MG TABS tablet Take 1 tablet (8.6 mg total) by mouth 2 (two) times daily. (Patient taking differently: Take 1 tablet by mouth at bedtime as needed for mild constipation (  if not taking Colace).) 120 tablet 0   SYSTANE PRESERVATIVE FREE 0.4-0.3 % SOLN Place 1 drop into the left eye 2 (two) times daily.     timolol (TIMOPTIC) 0.5 % ophthalmic solution Place 1 drop into the left eye 2 (two) times daily.     traZODone (DESYREL) 50 MG tablet Take 50 mg by mouth at bedtime as needed for sleep.     Trospium Chloride 60 MG CP24 Take 60 mg by mouth in the morning.     VITAMIN E PO Take 1 capsule by mouth daily.     bisacodyl (DULCOLAX) 10 MG suppository Place 1 suppository (10 mg total) rectally daily as needed for moderate constipation. (Patient not taking: Reported on 08/20/2022) 12 suppository 0   feeding supplement (ENSURE ENLIVE / ENSURE PLUS) LIQD Take 237 mLs by mouth 2 (two) times daily between meals. (Patient not taking: Reported on 08/20/2022) 237 mL 12   metoprolol succinate (TOPROL-XL) 25 MG 24 hr tablet Take 0.5 tablets (12.5 mg total) by mouth daily. (Patient not taking: Reported on 08/20/2022)     oxyCODONE (OXY IR/ROXICODONE) 5 MG immediate release tablet Take 1-2 tablets (5-10 mg total) by mouth every 4 (four) hours as needed for moderate pain or severe pain (5 mg moderate pain, 10  mg severe pain). (Patient not taking: Reported on 08/20/2022) 8 tablet 0     ROS: Constitutional: No fever or chills Vision: No changes in vision ENT: No difficulty swallowing CV: No chest pain Pulm: No SOB or wheezing GI: No nausea or vomiting GU: No urgency or inability to hold urine Skin: No poor wound healing Neurologic: No numbness or tingling Psychiatric: No depression or anxiety Heme: No bruising Allergic: No reaction to medications or food   Exam: Blood pressure (!) 107/47, pulse (!) 120, temperature 98 F (36.7 C), temperature source Oral, resp. rate 17, height 5\' 2"  (1.575 m), weight 69 kg, SpO2 98 %. General: No acute distress Orientation: Awake alert and oriented x 3 Mood and Affect: Cooperative and pleasant Gait: Unable to assess due to her fractures Coordination and balance: Within normal limits  Right lower extremity: Obvious deformity through the right distal femur.  Moderate swelling to the leg.  Compartments are soft compressible.  Actively able to dorsiflex and plantarflex her foot and ankle.  She has sensation intact to light touch to all nerve distributions.  She has a warm well-perfused foot with 2+ DP pulses.  No deformity through the right ankle or lower leg.  Left lower extremity: Skin without lesions. No tenderness to palpation. Full painless ROM, full strength in each muscle groups without evidence of instability.   Medical Decision Making: Data: Imaging: X-rays and CT scan are reviewed which show a distal femur fracture with intra-articular extension.  There is a previous nail below the previous right acetabular fracture.  Labs:  Results for orders placed or performed during the hospital encounter of 08/20/22 (from the past 24 hour(s))  CBC with Differential     Status: Abnormal   Collection Time: 08/20/22  4:02 PM  Result Value Ref Range   WBC 10.9 (H) 4.0 - 10.5 K/uL   RBC 3.20 (L) 3.87 - 5.11 MIL/uL   Hemoglobin 9.5 (L) 12.0 - 15.0 g/dL   HCT  10/19/22 (L) 54.2 - 46.0 %   MCV 87.8 80.0 - 100.0 fL   MCH 29.7 26.0 - 34.0 pg   MCHC 33.8 30.0 - 36.0 g/dL   RDW 70.6 23.7 - 62.8 %  Platelets 284 150 - 400 K/uL   nRBC 0.0 0.0 - 0.2 %   Neutrophils Relative % 75 %   Neutro Abs 8.2 (H) 1.7 - 7.7 K/uL   Lymphocytes Relative 12 %   Lymphs Abs 1.3 0.7 - 4.0 K/uL   Monocytes Relative 10 %   Monocytes Absolute 1.1 (H) 0.1 - 1.0 K/uL   Eosinophils Relative 1 %   Eosinophils Absolute 0.1 0.0 - 0.5 K/uL   Basophils Relative 1 %   Basophils Absolute 0.1 0.0 - 0.1 K/uL   Immature Granulocytes 1 %   Abs Immature Granulocytes 0.12 (H) 0.00 - 0.07 K/uL  Basic metabolic panel     Status: Abnormal   Collection Time: 08/20/22  4:02 PM  Result Value Ref Range   Sodium 129 (L) 135 - 145 mmol/L   Potassium 4.2 3.5 - 5.1 mmol/L   Chloride 92 (L) 98 - 111 mmol/L   CO2 25 22 - 32 mmol/L   Glucose, Bld 137 (H) 70 - 99 mg/dL   BUN 15 8 - 23 mg/dL   Creatinine, Ser 0.66 0.44 - 1.00 mg/dL   Calcium 8.8 (L) 8.9 - 10.3 mg/dL   GFR, Estimated >60 >60 mL/min   Anion gap 12 5 - 15  Protime-INR     Status: Abnormal   Collection Time: 08/20/22  4:02 PM  Result Value Ref Range   Prothrombin Time 18.8 (H) 11.4 - 15.2 seconds   INR 1.6 (H) 0.8 - 1.2  Comprehensive metabolic panel     Status: Abnormal   Collection Time: 08/21/22  3:22 AM  Result Value Ref Range   Sodium 130 (L) 135 - 145 mmol/L   Potassium 3.8 3.5 - 5.1 mmol/L   Chloride 91 (L) 98 - 111 mmol/L   CO2 26 22 - 32 mmol/L   Glucose, Bld 151 (H) 70 - 99 mg/dL   BUN 11 8 - 23 mg/dL   Creatinine, Ser 0.81 0.44 - 1.00 mg/dL   Calcium 8.9 8.9 - 10.3 mg/dL   Total Protein 6.5 6.5 - 8.1 g/dL   Albumin 3.0 (L) 3.5 - 5.0 g/dL   AST 25 15 - 41 U/L   ALT 18 0 - 44 U/L   Alkaline Phosphatase 64 38 - 126 U/L   Total Bilirubin 0.6 0.3 - 1.2 mg/dL   GFR, Estimated >60 >60 mL/min   Anion gap 13 5 - 15  CBC     Status: Abnormal   Collection Time: 08/21/22  3:22 AM  Result Value Ref Range   WBC 14.6  (H) 4.0 - 10.5 K/uL   RBC 2.98 (L) 3.87 - 5.11 MIL/uL   Hemoglobin 8.7 (L) 12.0 - 15.0 g/dL   HCT 26.9 (L) 36.0 - 46.0 %   MCV 90.3 80.0 - 100.0 fL   MCH 29.2 26.0 - 34.0 pg   MCHC 32.3 30.0 - 36.0 g/dL   RDW 12.7 11.5 - 15.5 %   Platelets 379 150 - 400 K/uL   nRBC 0.0 0.0 - 0.2 %  Surgical PCR screen     Status: None   Collection Time: 08/21/22  6:24 AM   Specimen: Nasal Mucosa; Nasal Swab  Result Value Ref Range   MRSA, PCR NEGATIVE NEGATIVE   Staphylococcus aureus NEGATIVE NEGATIVE     Imaging or Labs ordered: None  Medical history and chart was reviewed and case discussed with medical provider.  Assessment/Plan: 87 year old female status post ground-level fall with a right intra-articular distal femur fracture.  Due to the unstable nature of her injury I recommend proceeding with open reduction internal fixation.  Risks and benefits were discussed with the patient.  Risks included but not limited to bleeding, infection, malunion, nonunion, hardware failure, hardware irritation, nerve or blood vessel injury, posttraumatic arthritis, knee stiffness, even the possibility anesthetic complications.  She agrees to proceed with surgery and consent was obtained.  Tra Wilemon P. Caedmon Louque, MD [facility] [phone] (office) [email or web address]   

## 2022-08-21 NOTE — H&P (View-Only) (Signed)
Orthopaedic Trauma Service (OTS) Consult   Patient ID: TRAVIS WOTRING MRN: [id number] DOB/AGE: 85/04/1936 85 y.o.  Reason for Consult:Right distal femur fracture Referring Physician: Dr. Carter Kitten, MD [facility]  HPI: Carrie Barnes is an 87 y.o. female who is being seen in consultation at the request of Dr. Mardelle Matte for evaluation of right distal femur fracture.  Patient is known to me from a previous fall with a right acetabular fracture that underwent open reduction internal fixation also had a comminuted right proximal humerus fracture that underwent reverse total arthroplasty with Dr. Stann Mainland.  Patient had a fall sustaining a right distal femur fracture.  I have not recently seen her in the office as she had to postpone her appointment secondary to newly diagnosed atrial fibrillation.  Currently not have any symptoms.  Denies any pain other than her right leg.  She was doing well with her walking and was ambulating with the use of a walker. Patient was seen and evaluated in the preoperative holding area.  No family is at bedside but patient is able to converse and answer questions appropriately.    Past Medical History:  Diagnosis Date   Cholecystitis    Coronary artery disease    a. 03/2017: 80-90% LAD stenosis (PCI/DES placement with a 2.75x16 mm Promus Premier stent). No significant stenosis along RCA or LCx.    DJD (degenerative joint disease)    GERD (gastroesophageal reflux disease)    Hyperlipidemia    Hypertension    dx s/p MI    Ischemic cardiomyopathy    Myocardial infarction ([place]) 03/17/2017   03-2017 treated at [facility] [place] [facility]    Osteoporosis    Status post insertion of drug-eluting stent into left anterior descending (LAD) artery 03/2017    Past Surgical History:  Procedure Laterality Date   APPENDECTOMY     CESAREAN SECTION     CHOLECYSTECTOMY N/A 11/05/2017   Procedure: LAPAROSCOPIC CHOLECYSTECTOMY WITH INTRAOPERATIVE CHOLANGIOGRAM;   Surgeon: Armandina Gemma, MD;  Location: [facility];  Service: General;  Laterality: N/A;   ERCP N/A 11/11/2017   Procedure: ENDOSCOPIC RETROGRADE CHOLANGIOPANCREATOGRAPHY (ERCP);  Surgeon: Arta Silence, MD;  Location: [facility];  Service: Endoscopy;  Laterality: N/A;  possible   EUS N/A 11/11/2017   Procedure: UPPER ENDOSCOPIC ULTRASOUND (EUS) RADIAL;  Surgeon: Arta Silence, MD;  Location: [facility];  Service: Endoscopy;  Laterality: N/A;   FEMUR IM NAIL Right 07/13/2019   Procedure: INTRAMEDULLARY (IM) RETROGRADE FEMORAL NAILING;  Surgeon: Gaynelle Arabian, MD;  Location: [facility];  Service: Orthopedics;  Laterality: Right;  60min   LUMBAR LAMINECTOMY/ DECOMPRESSION WITH MET-RX     OPEN REDUCTION INTERNAL FIXATION ACETABULAR FRACTURE STOPPA Right 05/14/2022   Procedure: OPEN REDUCTION INTERNAL FIXATION RIGHT ACETABULUM;  Surgeon: Shona Needles, MD;  Location: [facility];  Service: Orthopedics;  Laterality: Right;   REVERSE SHOULDER ARTHROPLASTY Right 05/15/2022   Procedure: RIGHT REVERSE SHOULDER ARTHROPLASTY;  Surgeon: Nicholes Stairs, MD;  Location: [facility];  Service: Orthopedics;  Laterality: Right;   SHOULDER ARTHROSCOPY WITH ROTATOR CUFF REPAIR AND SUBACROMIAL DECOMPRESSION Right 09/20/2021   Procedure: SHOULDER MINI OPEN ROTATOR CUFF REPAIR AND SUBACROMIAL DECOMPRESSION WITH POSSIBLE PATCH GRAFT;  Surgeon: Susa Day, MD;  Location: [facility];  Service: Orthopedics;  Laterality: Right;  29 MINS   stent  Left 03/2017   anterior descending ; drug eluting ; tx of MI at new [place]  vaginal sling      Family History  Problem Relation Age of Onset   Heart disease Father    Breast cancer Sister    Pancreatic cancer Sister     Social History:  reports that she has quit smoking. Her smoking use included cigarettes. She has a 30.00 pack-year smoking history. She has never used smokeless tobacco. She reports current alcohol use.  She reports that she does not use drugs.  Allergies: No Known Allergies  Medications:  Current Facility-Administered Medications on File Prior to Encounter  Medication Dose Route Frequency Provider Last Rate Last Admin   indomethacin (INDOCIN) 50 MG suppository 50 mg  50 mg Rectal Once Arta Silence, MD       Current Outpatient Medications on File Prior to Encounter  Medication Sig Dispense Refill   acetaminophen (TYLENOL) 500 MG tablet Take 1,000 mg by mouth See admin instructions. Take 1,000 mg by mouth at bedtime and an additional 1,000 mg once a day as needed for pain     apixaban (ELIQUIS) 5 MG TABS tablet Take 1 tablet (5 mg total) by mouth 2 (two) times daily. 60 tablet 1   ascorbic acid (VITAMIN C) 500 MG tablet Take 500 mg by mouth daily.     aspirin EC 81 MG tablet Take 81 mg by mouth in the morning. Swallow whole.     atorvastatin (LIPITOR) 80 MG tablet Take 1 tablet (80 mg total) by mouth daily. (Patient taking differently: Take 80 mg by mouth at bedtime.) 90 tablet 3   Calcium Carb-Cholecalciferol (CALCIUM 600/VITAMIN D PO) Take 1 tablet by mouth in the morning and at bedtime.     cetirizine (ZYRTEC) 10 MG tablet Take 10 mg by mouth daily.     Cholecalciferol (VITAMIN D3) 2000 units capsule Take 1,000-2,000 Units by mouth daily.     CRANBERRY EXTRACT PO Take 25,000 mg by mouth 2 (two) times daily.     diltiazem (CARDIZEM CD) 120 MG 24 hr capsule Take 1 capsule (120 mg total) by mouth daily. 90 capsule 3   docusate sodium (COLACE) 100 MG capsule Take 1 capsule (100 mg total) by mouth 2 (two) times daily as needed for mild constipation. (Patient taking differently: Take 100 mg by mouth at bedtime as needed for mild constipation.) 30 capsule 1   latanoprost (XALATAN) 0.005 % ophthalmic solution Place 1 drop into both eyes at bedtime.     methenamine (HIPREX) 1 g tablet Take 1 g by mouth 2 (two) times daily with a meal.     metoprolol succinate (TOPROL-XL) 50 MG 24 hr tablet Take  25-50 mg by mouth See admin instructions. Take 50 mg by mouth at bedtime and an additional 25 mg once daily as needed for A-Fib. Take with or immediately following a meal.     Multiple Vitamin (MULTI-VITAMINS) TABS Take 1 tablet by mouth daily with breakfast.     omeprazole (PRILOSEC) 20 MG capsule Take 20 mg by mouth daily before breakfast.     polyethylene glycol (MIRALAX / GLYCOLAX) 17 g packet Take 17 g by mouth daily. 14 each 0   Probiotic Product (TRUNATURE DIGESTIVE PROBIOTIC) CAPS Take 1 capsule by mouth daily.     protein supplement shake (PREMIER PROTEIN) LIQD Take 11 oz by mouth daily.     senna (SENOKOT) 8.6 MG TABS tablet Take 1 tablet (8.6 mg total) by mouth 2 (two) times daily. (Patient taking differently: Take 1 tablet by mouth at bedtime as needed for mild constipation (  if not taking Colace).) 120 tablet 0   SYSTANE PRESERVATIVE FREE 0.4-0.3 % SOLN Place 1 drop into the left eye 2 (two) times daily.     timolol (TIMOPTIC) 0.5 % ophthalmic solution Place 1 drop into the left eye 2 (two) times daily.     traZODone (DESYREL) 50 MG tablet Take 50 mg by mouth at bedtime as needed for sleep.     Trospium Chloride 60 MG CP24 Take 60 mg by mouth in the morning.     VITAMIN E PO Take 1 capsule by mouth daily.     bisacodyl (DULCOLAX) 10 MG suppository Place 1 suppository (10 mg total) rectally daily as needed for moderate constipation. (Patient not taking: Reported on 08/20/2022) 12 suppository 0   feeding supplement (ENSURE ENLIVE / ENSURE PLUS) LIQD Take 237 mLs by mouth 2 (two) times daily between meals. (Patient not taking: Reported on 08/20/2022) 237 mL 12   metoprolol succinate (TOPROL-XL) 25 MG 24 hr tablet Take 0.5 tablets (12.5 mg total) by mouth daily. (Patient not taking: Reported on 08/20/2022)     oxyCODONE (OXY IR/ROXICODONE) 5 MG immediate release tablet Take 1-2 tablets (5-10 mg total) by mouth every 4 (four) hours as needed for moderate pain or severe pain (5 mg moderate pain, 10  mg severe pain). (Patient not taking: Reported on 08/20/2022) 8 tablet 0     ROS: Constitutional: No fever or chills Vision: No changes in vision ENT: No difficulty swallowing CV: No chest pain Pulm: No SOB or wheezing GI: No nausea or vomiting GU: No urgency or inability to hold urine Skin: No poor wound healing Neurologic: No numbness or tingling Psychiatric: No depression or anxiety Heme: No bruising Allergic: No reaction to medications or food   Exam: Blood pressure (!) 107/47, pulse (!) 120, temperature 98 F (36.7 C), temperature source Oral, resp. rate 17, height 5\' 2"  (1.575 m), weight 69 kg, SpO2 98 %. General: No acute distress Orientation: Awake alert and oriented x 3 Mood and Affect: Cooperative and pleasant Gait: Unable to assess due to her fractures Coordination and balance: Within normal limits  Right lower extremity: Obvious deformity through the right distal femur.  Moderate swelling to the leg.  Compartments are soft compressible.  Actively able to dorsiflex and plantarflex her foot and ankle.  She has sensation intact to light touch to all nerve distributions.  She has a warm well-perfused foot with 2+ DP pulses.  No deformity through the right ankle or lower leg.  Left lower extremity: Skin without lesions. No tenderness to palpation. Full painless ROM, full strength in each muscle groups without evidence of instability.   Medical Decision Making: Data: Imaging: X-rays and CT scan are reviewed which show a distal femur fracture with intra-articular extension.  There is a previous nail below the previous right acetabular fracture.  Labs:  Results for orders placed or performed during the hospital encounter of 08/20/22 (from the past 24 hour(s))  CBC with Differential     Status: Abnormal   Collection Time: 08/20/22  4:02 PM  Result Value Ref Range   WBC 10.9 (H) 4.0 - 10.5 K/uL   RBC 3.20 (L) 3.87 - 5.11 MIL/uL   Hemoglobin 9.5 (L) 12.0 - 15.0 g/dL   HCT  10/19/22 (L) 54.2 - 46.0 %   MCV 87.8 80.0 - 100.0 fL   MCH 29.7 26.0 - 34.0 pg   MCHC 33.8 30.0 - 36.0 g/dL   RDW 70.6 23.7 - 62.8 %  Platelets 284 150 - 400 K/uL   nRBC 0.0 0.0 - 0.2 %   Neutrophils Relative % 75 %   Neutro Abs 8.2 (H) 1.7 - 7.7 K/uL   Lymphocytes Relative 12 %   Lymphs Abs 1.3 0.7 - 4.0 K/uL   Monocytes Relative 10 %   Monocytes Absolute 1.1 (H) 0.1 - 1.0 K/uL   Eosinophils Relative 1 %   Eosinophils Absolute 0.1 0.0 - 0.5 K/uL   Basophils Relative 1 %   Basophils Absolute 0.1 0.0 - 0.1 K/uL   Immature Granulocytes 1 %   Abs Immature Granulocytes 0.12 (H) 0.00 - 0.07 K/uL  Basic metabolic panel     Status: Abnormal   Collection Time: 08/20/22  4:02 PM  Result Value Ref Range   Sodium 129 (L) 135 - 145 mmol/L   Potassium 4.2 3.5 - 5.1 mmol/L   Chloride 92 (L) 98 - 111 mmol/L   CO2 25 22 - 32 mmol/L   Glucose, Bld 137 (H) 70 - 99 mg/dL   BUN 15 8 - 23 mg/dL   Creatinine, Ser 0.66 0.44 - 1.00 mg/dL   Calcium 8.8 (L) 8.9 - 10.3 mg/dL   GFR, Estimated >60 >60 mL/min   Anion gap 12 5 - 15  Protime-INR     Status: Abnormal   Collection Time: 08/20/22  4:02 PM  Result Value Ref Range   Prothrombin Time 18.8 (H) 11.4 - 15.2 seconds   INR 1.6 (H) 0.8 - 1.2  Comprehensive metabolic panel     Status: Abnormal   Collection Time: 08/21/22  3:22 AM  Result Value Ref Range   Sodium 130 (L) 135 - 145 mmol/L   Potassium 3.8 3.5 - 5.1 mmol/L   Chloride 91 (L) 98 - 111 mmol/L   CO2 26 22 - 32 mmol/L   Glucose, Bld 151 (H) 70 - 99 mg/dL   BUN 11 8 - 23 mg/dL   Creatinine, Ser 0.81 0.44 - 1.00 mg/dL   Calcium 8.9 8.9 - 10.3 mg/dL   Total Protein 6.5 6.5 - 8.1 g/dL   Albumin 3.0 (L) 3.5 - 5.0 g/dL   AST 25 15 - 41 U/L   ALT 18 0 - 44 U/L   Alkaline Phosphatase 64 38 - 126 U/L   Total Bilirubin 0.6 0.3 - 1.2 mg/dL   GFR, Estimated >60 >60 mL/min   Anion gap 13 5 - 15  CBC     Status: Abnormal   Collection Time: 08/21/22  3:22 AM  Result Value Ref Range   WBC 14.6  (H) 4.0 - 10.5 K/uL   RBC 2.98 (L) 3.87 - 5.11 MIL/uL   Hemoglobin 8.7 (L) 12.0 - 15.0 g/dL   HCT 26.9 (L) 36.0 - 46.0 %   MCV 90.3 80.0 - 100.0 fL   MCH 29.2 26.0 - 34.0 pg   MCHC 32.3 30.0 - 36.0 g/dL   RDW 12.7 11.5 - 15.5 %   Platelets 379 150 - 400 K/uL   nRBC 0.0 0.0 - 0.2 %  Surgical PCR screen     Status: None   Collection Time: 08/21/22  6:24 AM   Specimen: Nasal Mucosa; Nasal Swab  Result Value Ref Range   MRSA, PCR NEGATIVE NEGATIVE   Staphylococcus aureus NEGATIVE NEGATIVE     Imaging or Labs ordered: None  Medical history and chart was reviewed and case discussed with medical provider.  Assessment/Plan: 87 year old female status post ground-level fall with a right intra-articular distal femur fracture.  Due to the unstable nature of her injury I recommend proceeding with open reduction internal fixation.  Risks and benefits were discussed with the patient.  Risks included but not limited to bleeding, infection, malunion, nonunion, hardware failure, hardware irritation, nerve or blood vessel injury, posttraumatic arthritis, knee stiffness, even the possibility anesthetic complications.  She agrees to proceed with surgery and consent was obtained.  Shona Needles, MD [facility] [phone] (office) [email or web address]

## 2022-08-22 DIAGNOSIS — S72421A Displaced fracture of lateral condyle of right femur, initial encounter for closed fracture: Secondary | ICD-10-CM | POA: Diagnosis not present

## 2022-08-22 DIAGNOSIS — S72431A Displaced fracture of medial condyle of right femur, initial encounter for closed fracture: Secondary | ICD-10-CM | POA: Diagnosis not present

## 2022-08-22 LAB — TYPE AND SCREEN
ABO/RH(D): A POS
Antibody Screen: NEGATIVE
Unit division: 0
Unit division: 0
Unit division: 0

## 2022-08-22 LAB — BPAM RBC
Blood Product Expiration Date: 202402012359
Blood Product Expiration Date: 202402102359
Blood Product Expiration Date: 202402102359
ISSUE DATE / TIME: 202401111124
ISSUE DATE / TIME: 202401111217
Unit Type and Rh: 6200
Unit Type and Rh: 6200
Unit Type and Rh: 6200

## 2022-08-22 LAB — BASIC METABOLIC PANEL
Anion gap: 10 (ref 5–15)
BUN: 24 mg/dL — ABNORMAL HIGH (ref 8–23)
CO2: 27 mmol/L (ref 22–32)
Calcium: 8.1 mg/dL — ABNORMAL LOW (ref 8.9–10.3)
Chloride: 98 mmol/L (ref 98–111)
Creatinine, Ser: 0.66 mg/dL (ref 0.44–1.00)
GFR, Estimated: >60 mL/min (ref >60)
Glucose, Bld: 134 mg/dL — ABNORMAL HIGH (ref 70–99)
Potassium: 4.6 mmol/L (ref 3.5–5.1)
Sodium: 135 mmol/L (ref 135–145)

## 2022-08-22 LAB — CBC
HCT: 22 % — ABNORMAL LOW (ref 36.0–46.0)
Hemoglobin: 8 g/dL — ABNORMAL LOW (ref 12.0–15.0)
MCH: 34.9 pg — ABNORMAL HIGH (ref 26.0–34.0)
MCHC: 36.4 g/dL — ABNORMAL HIGH (ref 30.0–36.0)
MCV: 96.1 fL (ref 80.0–100.0)
Platelets: 137 10*3/uL — ABNORMAL LOW (ref 150–400)
RBC: 2.29 MIL/uL — ABNORMAL LOW (ref 3.87–5.11)
RDW: 20.2 % — ABNORMAL HIGH (ref 11.5–15.5)
WBC: 7.1 10*3/uL (ref 4.0–10.5)
nRBC: 0 % (ref 0.0–0.2)

## 2022-08-22 MED ORDER — APIXABAN 5 MG PO TABS
5.0000 mg | ORAL_TABLET | Freq: Two times a day (BID) | ORAL | Status: DC
  Administered 2022-08-22 – 2022-08-25 (×7): 5 mg via ORAL
  Filled 2022-08-22 (×7): qty 1

## 2022-08-22 MED ORDER — TIMOLOL MALEATE 0.5 % OP SOLN
1.0000 [drp] | Freq: Two times a day (BID) | OPHTHALMIC | Status: DC
  Administered 2022-08-22 – 2022-08-25 (×7): 1 [drp] via OPHTHALMIC
  Filled 2022-08-22: qty 5

## 2022-08-22 MED ORDER — PANTOPRAZOLE SODIUM 40 MG PO TBEC
40.0000 mg | DELAYED_RELEASE_TABLET | Freq: Every day | ORAL | Status: DC
  Administered 2022-08-22 – 2022-08-25 (×4): 40 mg via ORAL
  Filled 2022-08-22 (×4): qty 1

## 2022-08-22 MED ORDER — LATANOPROST 0.005 % OP SOLN
1.0000 [drp] | Freq: Every day | OPHTHALMIC | Status: DC
  Administered 2022-08-22 – 2022-08-24 (×3): 1 [drp] via OPHTHALMIC
  Filled 2022-08-22: qty 2.5

## 2022-08-22 MED ORDER — POLYVINYL ALCOHOL 1.4 % OP SOLN
1.0000 [drp] | Freq: Two times a day (BID) | OPHTHALMIC | Status: DC
  Administered 2022-08-22 – 2022-08-23 (×3): 1 [drp] via OPHTHALMIC
  Filled 2022-08-22: qty 15

## 2022-08-22 NOTE — Progress Notes (Cosign Needed)
Orthopaedic Trauma Progress Note  SUBJECTIVE: Doing fairly well this morning, pain controlled.  No chest pain. No SOB. No nausea/vomiting. No other complaints.  Back to eat her breakfast.  Has not been up out of bed yet since surgery.  Patient states she went to [facility] following previous surgeries, had a good experience.  If SNF is recommended, would prefer to go back to [facility] if possible.  OBJECTIVE:  Vitals:   08/22/22 0500 08/22/22 0600  BP: (!) 87/44 92/60  Pulse: 71 73  Resp: 18 15  Temp:    SpO2: 97% 93%    General: Bed, no acute distress.  Pleasant and cooperative Respiratory: No increased work of breathing.  Right lower extremity: Dressing clean, dry, intact.  Tender over the knee and throughout the distal thigh as expected.  Less tender throughout the lower leg.  Ankle DF/PF intact.  Endorses sensation to all aspects of the foot.  Toes warm and well-perfused. + DP pulse  IMAGING: Stable post op imaging.    LABS:  Results for orders placed or performed during the hospital encounter of 08/20/22 (from the past 24 hour(s))  Type and screen [place]     Status: None (Preliminary result)   Collection Time: 08/21/22 10:10 AM  Result Value Ref Range   ABO/RH(D) A POS    Antibody Screen NEG    Sample Expiration      08/24/2022,2359 Performed at [facility], [place] [address], [place], [place] [address]    Unit Number [id number]    Blood Component Type RED CELLS,LR    Unit division 00    Status of Unit ISSUED    Transfusion Status OK TO TRANSFUSE    Crossmatch Result Compatible    Unit Number [id number]    Blood Component Type RED CELLS,LR    Unit division 00    Status of Unit ISSUED    Transfusion Status OK TO TRANSFUSE    Crossmatch Result Compatible    Unit Number [id number]    Blood Component Type RED CELLS,LR    Unit division 00    Status of Unit REL FROM [facility]    Transfusion Status OK TO TRANSFUSE    Crossmatch Result  Compatible   Prepare RBC (crossmatch)     Status: None   Collection Time: 08/21/22 10:16 AM  Result Value Ref Range   Order Confirmation      ORDER PROCESSED BY BLOOD BANK Performed at [facility], [address] [place], [place] [address]   Prepare RBC (crossmatch)     Status: None   Collection Time: 08/21/22  1:00 PM  Result Value Ref Range   Order Confirmation      ORDER PROCESSED BY BLOOD BANK Performed at [facility], [place] [address] [place], [place] [address]   VITAMIN D 25 Hydroxy (Vit-D Deficiency, Fractures)     Status: None   Collection Time: 08/21/22  5:11 PM  Result Value Ref Range   Vit D, 25-Hydroxy 40.60 30 - 100 ng/mL  CBC     Status: Abnormal   Collection Time: 08/22/22  1:40 AM  Result Value Ref Range   WBC 7.1 4.0 - 10.5 K/uL   RBC 2.29 (L) 3.87 - 5.11 MIL/uL   Hemoglobin 8.0 (L) 12.0 - 15.0 g/dL   HCT 22.0 (L) 36.0 - 46.0 %   MCV 96.1 80.0 - 100.0 fL   MCH 34.9 (H) 26.0 - 34.0 pg   MCHC  36.4 (H) 30.0 - 36.0 g/dL   RDW 20.2 (H) 11.5 - 15.5 %   Platelets 137 (L) 150 - 400 K/uL   nRBC 0.0 0.0 - 0.2 %  Basic metabolic panel     Status: Abnormal   Collection Time: 08/22/22  1:40 AM  Result Value Ref Range   Sodium 135 135 - 145 mmol/L   Potassium 4.6 3.5 - 5.1 mmol/L   Chloride 98 98 - 111 mmol/L   CO2 27 22 - 32 mmol/L   Glucose, Bld 134 (H) 70 - 99 mg/dL   BUN 24 (H) 8 - 23 mg/dL   Creatinine, Ser 0.66 0.44 - 1.00 mg/dL   Calcium 8.1 (L) 8.9 - 10.3 mg/dL   GFR, Estimated >60 >60 mL/min   Anion gap 10 5 - 15    ASSESSMENT: Carrie Barnes is a 87 y.o. female, 1 Day Post-Op s/p OPEN REDUCTION INTERNAL FIXATION RIGHT DISTAL FEMUR FRACTURE  CV/Blood loss: Acute blood loss anemia, Hgb 8.0 this morning.  Received 1 unit PRBCs intraoperatively on 08/21/2022.  BPs have been soft overnight  PLAN: Weightbearing: TDWB RLE ROM: Okay for unrestricted knee ROM Incisional and dressing care: Reinforce dressings as needed  Showering: Hold off for  now Orthopedic device(s): No bracing required Pain management:  1. Tylenol 325-650 mg q 6 hours PRN 2. Norco 5-325 mg OR 7.5-325 mg (1-2 tab) q 4 hours PRN 3. Morphine 0.5-1 mg q 2 hours PRN VTE prophylaxis:  Restart Eliquis today , SCDs ID:  Ancef 2gm post op Foley/Lines:  No foley, KVO IVFs Impediments to Fracture Healing: Vitamin D level rechecked on 08/21/2022, remains at appropriate level 40.6.  No supplementation indicated Dispo: PT/OT evaluation today, dispo pending.  Patient does live alone so will likely need SNF at discharge.  Her preference is for [facility].  Continue to monitor CBC.  Plan to remove dressing 08/23/2022 first 08/24/2022.  Okay for discharge from ortho standpoint once cleared by medicine team and therapies  D/C recommendations: - Norco for pain control - Home dose Eliquis for DVT prophylaxis - No need for additional Vit D supplementation  Follow - up plan: 2 weeks after d/c for wound check and repeat x-rays   Contact information:  Katha Hamming MD, Rushie Nyhan PA-C. After hours and holidays please check [email or web address] for group call information for [facility]   Gwinda Passe, PA-C [phone] (office) [email or web address]

## 2022-08-22 NOTE — TOC CAGE-AID Note (Signed)
Transition of Care [facility]) - CAGE-AID Screening   Patient Details  Name: Carrie Barnes MRN: [id number] Date of Birth: [birth year]  [facility] CM/SW Contact:    Clovis Cao, RN Phone Number: 08/22/2022, 11:43 AM   Clinical Narrative: Pt is here after sustaining a distal femur fracture.  Pt denies drug use but admits to occasional alcohol use.  No resources needed.  Screening complete.   CAGE-AID Screening:    Have You Ever Felt You Ought to Cut Down on Your Drinking or Drug Use?: No Have People Annoyed You By Critizing Your Drinking Or Drug Use?: No Have You Felt Bad Or Guilty About Your Drinking Or Drug Use?: No Have You Ever Had a Drink or Used Drugs First Thing In The Morning to Steady Your Nerves or to Get Rid of a Hangover?: No CAGE-AID Score: 0  Substance Abuse Education Offered: No

## 2022-08-22 NOTE — Discharge Instructions (Signed)
Information on my medicine - ELIQUIS (apixaban)  This medication education was reviewed with me or my healthcare representative as part of my discharge preparation.   Why was Eliquis prescribed for you? Eliquis was prescribed for you to reduce the risk of a blood clot forming that can cause a stroke if you have a medical condition called atrial fibrillation (a type of irregular heartbeat).  What do You need to know about Eliquis ? Take your Eliquis TWICE DAILY - one tablet in the morning and one tablet in the evening with or without food. If you have difficulty swallowing the tablet whole please discuss with your pharmacist how to take the medication safely.  Take Eliquis exactly as prescribed by your doctor and DO NOT stop taking Eliquis without talking to the doctor who prescribed the medication.  Stopping may increase your risk of developing a stroke.  Refill your prescription before you run out.  After discharge, you should have regular check-up appointments with your healthcare provider that is prescribing your Eliquis.  In the future your dose may need to be changed if your kidney function or weight changes by a significant amount or as you get older.  What do you do if you miss a dose? If you miss a dose, take it as soon as you remember on the same day and resume taking twice daily.  Do not take more than one dose of ELIQUIS at the same time to make up a missed dose.  Important Safety Information A possible side effect of Eliquis is bleeding. You should call your healthcare provider right away if you experience any of the following: Bleeding from an injury or your nose that does not stop. Unusual colored urine (red or dark brown) or unusual colored stools (red or black). Unusual bruising for unknown reasons. A serious fall or if you hit your head (even if there is no bleeding).  Some medicines may interact with Eliquis and might increase your risk of bleeding or clotting  while on Eliquis. To help avoid this, consult your healthcare provider or pharmacist prior to using any new prescription or non-prescription medications, including herbals, vitamins, non-steroidal anti-inflammatory drugs (NSAIDs) and supplements.  This website has more information on Eliquis (apixaban): http://[email or web address].com/eliquis/home     [facility] Discharge Instructions   General Discharge Instructions  WEIGHT BEARING STATUS:Touchdown weightbearing right lower extremity  RANGE OF MOTION/ACTIVITY: ok for knee range of motion as tolerated  Wound Care: You may remove your surgical dressing. Incisions can be left open to air if there is no drainage. Once the incision is completely dry and without drainage, it may be left open to air out.  Showering may begin Monday 08/25/22. Clean incision gently with soap and water.  DVT/PE prophylaxis:  Home dose Eliquis  Diet: as you were eating previously.  Can use over the counter stool softeners and bowel preparations, such as Miralax, to help with bowel movements.  Narcotics can be constipating.  Be sure to drink plenty of fluids  PAIN MEDICATION USE AND EXPECTATIONS  You have likely been given narcotic medications to help control your pain.  After a traumatic event that results in an fracture (broken bone) with or without surgery, it is ok to use narcotic pain medications to help control one's pain.  We understand that everyone responds to pain differently and each individual patient will be evaluated on a regular basis for the continued need for narcotic medications. Ideally, narcotic medication use should last no more than 6-8  weeks (coinciding with fracture healing).   As a patient it is your responsibility as well to monitor narcotic medication use and report the amount and frequency you use these medications when you come to your office visit.   We would also advise that if you are using narcotic medications, you should take a  dose prior to therapy to maximize you participation.  IF YOU ARE ON NARCOTIC MEDICATIONS IT IS NOT PERMISSIBLE TO OPERATE A MOTOR VEHICLE (MOTORCYCLE/CAR/TRUCK/MOPED) OR HEAVY MACHINERY DO NOT MIX NARCOTICS WITH OTHER CNS (CENTRAL NERVOUS SYSTEM) DEPRESSANTS SUCH AS ALCOHOL   STOP SMOKING OR USING NICOTINE PRODUCTS!!!!  As discussed nicotine severely impairs your body's ability to heal surgical and traumatic wounds but also impairs bone healing.  Wounds and bone heal by forming microscopic blood vessels (angiogenesis) and nicotine is a vasoconstrictor (essentially, shrinks blood vessels).  Therefore, if vasoconstriction occurs to these microscopic blood vessels they essentially disappear and are unable to deliver necessary nutrients to the healing tissue.  This is one modifiable factor that you can do to dramatically increase your chances of healing your injury.    (This means no smoking, no nicotine gum, patches, etc)  DO NOT USE NONSTEROIDAL ANTI-INFLAMMATORY DRUGS (NSAID'S)  Using products such as Advil (ibuprofen), Aleve (naproxen), Motrin (ibuprofen) for additional pain control during fracture healing can delay and/or prevent the healing response.  If you would like to take over the counter (OTC) medication, Tylenol (acetaminophen) is ok.  However, some narcotic medications that are given for pain control contain acetaminophen as well. Therefore, you should not exceed more than 4000 mg of tylenol in a day if you do not have liver disease.  Also note that there are may OTC medicines, such as cold medicines and allergy medicines that my contain tylenol as well.  If you have any questions about medications and/or interactions please ask your doctor/PA or your pharmacist.      ICE AND ELEVATE INJURED/OPERATIVE EXTREMITY  Using ice and elevating the injured extremity above your heart can help with swelling and pain control.  Icing in a pulsatile fashion, such as 20 minutes on and 20 minutes off, can  be followed.    Do not place ice directly on skin. Make sure there is a barrier between to skin and the ice pack.    Using frozen items such as frozen peas works well as the conform nicely to the are that needs to be iced.  USE AN ACE WRAP OR TED HOSE FOR SWELLING CONTROL  In addition to icing and elevation, Ace wraps or TED hose are used to help limit and resolve swelling.  It is recommended to use Ace wraps or TED hose until you are informed to stop.    When using Ace Wraps start the wrapping distally (farthest away from the body) and wrap proximally (closer to the body)   Example: If you had surgery on your leg or thing and you do not have a splint on, start the ace wrap at the toes and work your way up to the thigh        If you had surgery on your upper extremity and do not have a splint on, start the ace wrap at your fingers and work your way up to the upper arm  CALL THE OFFICE WITH ANY QUESTIONS OR CONCERNS: [phone]   VISIT OUR WEBSITE FOR ADDITIONAL INFORMATION: [email or web address]     Discharge Wound Care Instructions  Do NOT apply any ointments, solutions or lotions to  pin sites or surgical wounds.  These prevent needed drainage and even though solutions like hydrogen peroxide kill bacteria, they also damage cells lining the pin sites that help fight infection.  Applying lotions or ointments can keep the wounds moist and can cause them to breakdown and open up as well. This can increase the risk for infection. When in doubt call the office.  If any drainage is noted, use one layer of adaptic or Mepitel, then gauze, Kerlix, and an ace wrap. - These dressing supplies should be available at local medical supply stores [facility], [facility], etc) as well as Insurance claims handler ([facility], [facility], [place], etc)  Once the incision is completely dry and without drainage, it may be left open to air out.  Showering may begin 36-48 hours later.  Cleaning gently with soap and  water.

## 2022-08-22 NOTE — Progress Notes (Addendum)
PROGRESS NOTE  Carrie Barnes  DOB: [birth year]  PCP: Seward Carol, MD VQX:[id number]  DOA: 08/20/2022  LOS: 2 days  Hospital Day: 3  Brief narrative: Carrie Barnes is a 87 y.o. female with PMH significant for HTN, HLD, A-fib on Eliquis, CAD/stents, ischemic cardiomyopathy, GERD, DJD who usually walks with a walker  1/10, patient had a mechanical fall onto her right knee with immediate severe pain and inability to bear weight. Imaging in the ED showed right intra-articular distal femoral fracture.  Orthopedics was consulted Admitted to [facility] 1/11, underwent ORIF of right distal femur fracture  Subjective: Patient was seen and examined this morning propped up in bed.  Not in distress. Slept well last night.  Pain controlled Taken off Cardizem drip yesterday evening. Overnight remained in normal sinus rhythm, blood pressure remained low in 90s.  Assessment and plan: Right intra-articular distal femur fracture S/p ORIF by orthopedics 1/11 Norco for pain control. On Eliquis Vitamin D level adequate at 40.  A-fib with RVR 1/11, patient was in A-fib with RVR intraoperatively requiring Cardizem drip.  Subsequently taken off Cardizem drip.  Resumed home regimen of Cardizem 120 mg daily and Toprol 50 mg at bedtime.  Remains in normal sinus rhythm.   Chronically anticoagulated with Eliquis.  Currently on hold  CAD/HLD No anginal symptoms.  Continue statin  Essential hypertension Blood pressure was low overnight. Continue IV hydration Continue to monitor blood pressure on Cardizem and metoprolol.  Hyponatremia Mild.  Continue to monitor Recent Labs  Lab 08/20/22 1602 08/21/22 0322 08/22/22 0140  NA 129* 130* 135   Mobility: PT eval pending  Goals of care   Code Status: Full Code    Scheduled Meds:  apixaban  5 mg Oral BID   atorvastatin  80 mg Oral QHS   diltiazem  120 mg Oral Daily   docusate sodium  100 mg Oral BID   latanoprost  1 drop Both Eyes QHS    metoprolol succinate  50 mg Oral QHS   mupirocin ointment  1 Application Nasal BID   pantoprazole  40 mg Oral Daily   polyvinyl alcohol  1 drop Left Eye BID   timolol  1 drop Left Eye BID    PRN meds: acetaminophen, HYDROcodone-acetaminophen, HYDROcodone-acetaminophen, lip balm, metoCLOPramide **OR** metoCLOPramide (REGLAN) injection, morphine injection, ondansetron **OR** ondansetron (ZOFRAN) IV, polyethylene glycol   Infusions:   sodium chloride 75 mL (08/21/22 1652)    Skin assessment:     Nutritional status:  Body mass index is 27.82 kg/m.          Diet:  Diet Order             Diet Heart Room service appropriate? Yes; Fluid consistency: Thin  Diet effective now                   DVT prophylaxis:  SCDs Start: 08/21/22 1655 Place TED hose Start: 08/21/22 0102 apixaban (ELIQUIS) tablet 5 mg   Antimicrobials: None Fluid: NS at 75 mill per hour Consultants: Orthopedics Family Communication: None at bedside  Status is: Inpatient  Continue in-hospital care because: POD 1, pending PT eval. Level of care: Progressive   Dispo: The patient is from: Home              Anticipated d/c is to: Pending clinical course.  Likely SNF headed              Patient currently is not medically stable to d/c.   Difficult to  place patient No    Antimicrobials: Anti-infectives (From admission, onward)    Start     Dose/Rate Route Frequency Ordered Stop   08/21/22 1900  ceFAZolin (ANCEF) IVPB 2g/100 mL premix  Status:  Discontinued        2 g 200 mL/hr over 30 Minutes Intravenous Every 8 hours 08/21/22 1654 08/21/22 1658   08/21/22 1800  ceFAZolin (ANCEF) IVPB 2g/100 mL premix        2 g 200 mL/hr over 30 Minutes Intravenous Every 8 hours 08/21/22 1658 08/22/22 0846   08/21/22 1045  vancomycin (VANCOCIN) powder  Status:  Discontinued          As needed 08/21/22 1046 08/21/22 1157   08/21/22 0915  ceFAZolin (ANCEF) IVPB 2g/100 mL premix        2 g 200 mL/hr over 30  Minutes Intravenous On call to O.R. 08/21/22 0816 08/21/22 1020   08/21/22 0812  ceFAZolin (ANCEF) 2-4 GM/100ML-% IVPB       Note to Pharmacy: Brandt Loosen, [name]: cabinet override      08/21/22 0812 08/21/22 1015       Objective: Vitals:   08/22/22 0830 08/22/22 0850  BP:    Pulse:  80  Resp: 18   Temp:    SpO2:  94%    Intake/Output Summary (Last 24 hours) at 08/22/2022 1035 Last data filed at 08/22/2022 0500 Gross per 24 hour  Intake 4486.13 ml  Output 675 ml  Net 3811.13 ml   Filed Weights   08/20/22 1530  Weight: 69 kg   Weight change:  Body mass index is 27.82 kg/m.   Physical Exam: General exam: Pleasant, elderly Caucasian female.  Not in pain Skin: No rashes, lesions or ulcers. HEENT: Atraumatic, normocephalic, no obvious bleeding Lungs: Clear to auscultation bilaterally CVS: Regular rate and rhythm, no murmur GI/Abd soft, nontender, nondistended, bowel sound present CNS: Alert, awake, oriented x 3 Psychiatry: Mood appropriate Extremities: No pedal edema, no calf tenderness  Data Review: I have personally reviewed the laboratory data and studies available.  F/u labs ordered Unresulted Labs (From admission, onward)     Start     Ordered   08/22/22 0500  CBC  Daily,   R      08/21/22 1654            Total time spent in review of labs and imaging, patient evaluation, formulation of plan, documentation and communication with family: 34 minutes  Signed, Terrilee Croak, MD [facility] 08/22/2022

## 2022-08-22 NOTE — Care Management Important Message (Signed)
Important Message  Patient Details  Name: Carrie Barnes MRN: [id number] Date of Birth: [birth year]   Medicare Important Message Given:  Yes     Shelda Altes 08/22/2022, 8:40 AM

## 2022-08-22 NOTE — Evaluation (Signed)
Physical Therapy Evaluation Patient Details Name: Carrie Barnes MRN: [id number] DOB: Mar 12, 1936 Today's Date: 08/22/2022  History of Present Illness  Pt is a 87 y/o female presenting on 1/10 after mechanical fall. Imaging with R intra-articular distal femoral fracture. S/P ORIF 1/11. PMH: CAD last cardiac stenting in 2018, GERD, HLD, HTN, ORIF R acetabular fx 05/2022, R reverse shoulder arthroplasty 05/2022.   Clinical Impression  Pt presents with condition above and deficits mentioned below, see PT Problem List. PTA, she was living alone in a 1-level apartment with a level entry and was mod I for functional mobility using her rollator. Pt reports she had recently returned home from getting rehab at a SNF after another fall in October 2023 where she had sustained a right acetabular/iliac crest fx and R humeral head and neck fx. Currently, pt is demonstrating deficits in bil lower extremity strength, R knee ROM, balance, activity tolerance, and power. She is at high risk for subsequent falls. Pt required modA for bed mobility, minA to scoot laterally along EOB, modAx2 to transfer to stand, and maxAx2 to pivot her hips to transfer bed > chair. Pt would benefit from short-term rehab at a SNF to increase her independence and safety with functional mobility. Will continue to follow acutely.       Recommendations for follow up therapy are one component of a multi-disciplinary discharge planning process, led by the attending physician.  Recommendations may be updated based on patient status, additional functional criteria and insurance authorization.  Follow Up Recommendations Skilled nursing-short term rehab (<3 hours/day) Can patient physically be transported by private vehicle: No    Assistance Recommended at Discharge Frequent or constant Supervision/Assistance  Patient can return home with the following  Two people to help with walking and/or transfers;A lot of help with  bathing/dressing/bathroom;Assistance with cooking/housework;Assist for transportation    Equipment Recommendations None recommended by PT  Recommendations for Other Services       Functional Status Assessment Patient has had a recent decline in their functional status and demonstrates the ability to make significant improvements in function in a reasonable and predictable amount of time.     Precautions / Restrictions Precautions Precautions: Fall Restrictions Weight Bearing Restrictions: Yes RLE Weight Bearing: Touchdown weight bearing      Mobility  Bed Mobility Overal bed mobility: Needs Assistance Bed Mobility: Supine to Sit     Supine to sit: Mod assist, HOB elevated     General bed mobility comments: ModA to manage R leg off EOB, trunk to ascend, and hips with scooting forward. increased time required    Transfers Overall transfer level: Needs assistance Equipment used: Rolling walker (2 wheels) Transfers: Sit to/from Stand, Bed to chair/wheelchair/BSC Sit to Stand: Mod assist, +2 physical assistance, +2 safety/equipment     Squat pivot transfers: Max assist, +2 physical assistance, +2 safety/equipment    Lateral/Scoot Transfers: Min assist General transfer comment: cueing for hand placement and safety, PT supporting R LE to ensure TDWB.  Pt able to stand on L LE but demonstrates difficulty pivoting on LE overall requiring max assist +2 to pivot into recliner. MinA to scoot to L laterally on EOB, needing cues to not scoot anteriorly when she does.    Ambulation/Gait               General Gait Details: unable  Stairs            Wheelchair Mobility    Modified Rankin (Stroke Patients Only)  Balance Overall balance assessment: Needs assistance Sitting-balance support: No upper extremity supported, Feet supported Sitting balance-Leahy Scale: Fair     Standing balance support: Bilateral upper extremity supported, During functional  activity Standing balance-Leahy Scale: Poor Standing balance comment: relies on UE and external support                             Pertinent Vitals/Pain Pain Assessment Pain Assessment: Faces Faces Pain Scale: Hurts little more Pain Location: R LE Pain Descriptors / Indicators: Discomfort, Grimacing, Guarding Pain Intervention(s): Limited activity within patient's tolerance, Monitored during session, Premedicated before session, Repositioned    Home Living Family/patient expects to be discharged to:: Private residence Living Arrangements: Alone Available Help at Discharge: Family;Available PRN/intermittently Type of Home: Apartment Home Access: Level entry       Home Layout: One level Home Equipment: Rolling Walker (2 wheels);Grab bars - tub/shower;Hand held shower head;Rollator (4 wheels);Cane - single point;BSC/3in1      Prior Function Prior Level of Function : Independent/Modified Independent             Mobility Comments: rollator for mobility ADLs Comments: independent ADLs, IADLs with family support     Hand Dominance   Dominant Hand: Right    Extremity/Trunk Assessment   Upper Extremity Assessment Upper Extremity Assessment: Defer to OT evaluation RUE Deficits / Details: hx of recent shoulder arthroplasty after fall 10/23, pt reports no further restrictions but limited AROM at shoulder to approx 80*, education on preventing shoulder elevation when working on ROM exercises RUE Coordination: decreased gross motor    Lower Extremity Assessment Lower Extremity Assessment: RLE deficits/detail;LLE deficits/detail RLE Deficits / Details: Limited knee flexion AAROM to ~55* and knee extension AROM to about -10* s/p ORIF of distal femur fx; ACE bandage from thigh to foot; gross weakness LLE Deficits / Details: gross weakness noted in L with functional mobility    Cervical / Trunk Assessment Cervical / Trunk Assessment: Normal  Communication    Communication: No difficulties  Cognition Arousal/Alertness: Awake/alert Behavior During Therapy: WFL for tasks assessed/performed Overall Cognitive Status: Within Functional Limits for tasks assessed                                          General Comments General comments (skin integrity, edema, etc.): daughter in law present and supportive. VSS on RA, BP 113/84 supine, 126/50 sitting    Exercises General Exercises - Lower Extremity Heel Slides: AAROM, Right, 5 reps, Supine   Assessment/Plan    PT Assessment Patient needs continued PT services  PT Problem List Decreased strength;Decreased range of motion;Decreased activity tolerance;Decreased balance;Decreased mobility;Pain       PT Treatment Interventions Gait training;DME instruction;Functional mobility training;Therapeutic activities;Therapeutic exercise;Balance training;Neuromuscular re-education;Patient/family education    PT Goals (Current goals can be found in the Care Plan section)  Acute Rehab PT Goals Patient Stated Goal: to be independent again PT Goal Formulation: With patient/family Time For Goal Achievement: 09/05/22 Potential to Achieve Goals: Good    Frequency Min 3X/week     Co-evaluation PT/OT/SLP Co-Evaluation/Treatment: Yes Reason for Co-Treatment: For patient/therapist safety;To address functional/ADL transfers PT goals addressed during session: Balance;Mobility/safety with mobility;Strengthening/ROM;Proper use of DME OT goals addressed during session: ADL's and self-care       AM-PAC PT "6 Clicks" Mobility  Outcome Measure Help needed turning from your back to  your side while in a flat bed without using bedrails?: A Lot Help needed moving from lying on your back to sitting on the side of a flat bed without using bedrails?: A Lot Help needed moving to and from a bed to a chair (including a wheelchair)?: Total Help needed standing up from a chair using your arms (e.g., wheelchair  or bedside chair)?: Total Help needed to walk in hospital room?: Total Help needed climbing 3-5 steps with a railing? : Total 6 Click Score: 8    End of Session Equipment Utilized During Treatment: Gait belt Activity Tolerance: Patient tolerated treatment well Patient left: in chair;with call bell/phone within reach;with chair alarm set;with family/visitor present Nurse Communication: Mobility status PT Visit Diagnosis: Unsteadiness on feet (R26.81);Other abnormalities of gait and mobility (R26.89);Muscle weakness (generalized) (M62.81);History of falling (Z91.81);Difficulty in walking, not elsewhere classified (R26.2);Pain Pain - Right/Left: Right Pain - part of body: Knee;Leg    Time: 1013-1040 PT Time Calculation (min) (ACUTE ONLY): 27 min   Charges:   PT Evaluation $PT Eval Moderate Complexity: 1 Mod          Moishe Spice, PT, DPT Acute Rehabilitation Services  Office: [phone]   Orvan Falconer 08/22/2022, 1:16 PM

## 2022-08-22 NOTE — Evaluation (Signed)
Occupational Therapy Evaluation Patient Details Name: Carrie Barnes MRN: [id number] DOB: 1935-12-10 Today's Date: 08/22/2022   History of Present Illness Pt is a 87 y/o female presenting on 1/10 after mechanical fall. Imaging with R intra-articular distal femoral fracture. S/P ORIF 1/11. PMH: CAD last cardiac stenting in 2018, GERD, HLD, HTN, ORIF R acetabular fx 05/2022, R reverse shoulder arthroplasty 05/2022.   Clinical Impression   PTA patient reports independent for ADLs, using rollator for mobility.  She has assist from family for IADLs.  Admitted for above and presents with problem list below.  Today, she completes bed mobility with mod assist, transfers with mod-max assist +2 and requires setup to total assist +2 for ADLs.  Pt with recent fall in 05/2022 with dc to SNF- she reports no further restrictions since R shoulder surgery but is still working on ROM, and has since been at home managing well.  Based on performance today, believe she will best benefit from continued OT services acutely and after dc at SNF level to optimize independence, safety and return to PLOF.      Recommendations for follow up therapy are one component of a multi-disciplinary discharge planning process, led by the attending physician.  Recommendations may be updated based on patient status, additional functional criteria and insurance authorization.   Follow Up Recommendations  Skilled nursing-short term rehab (<3 hours/day)     Assistance Recommended at Discharge Frequent or constant Supervision/Assistance  Patient can return home with the following Two people to help with walking and/or transfers;Two people to help with bathing/dressing/bathroom;Assistance with cooking/housework;Assist for transportation;Help with stairs or ramp for entrance    Functional Status Assessment  Patient has had a recent decline in their functional status and demonstrates the ability to make significant improvements in function  in a reasonable and predictable amount of time.  Equipment Recommendations  None recommended by OT    Recommendations for Other Services       Precautions / Restrictions Precautions Precautions: Fall Restrictions Weight Bearing Restrictions: Yes RLE Weight Bearing: Touchdown weight bearing      Mobility Bed Mobility Overal bed mobility: Needs Assistance Bed Mobility: Supine to Sit     Supine to sit: Mod assist     General bed mobility comments: for R LE, trunk and scooting forward. increased time required    Transfers Overall transfer level: Needs assistance Equipment used: Rolling walker (2 wheels) Transfers: Sit to/from Stand, Bed to chair/wheelchair/BSC Sit to Stand: Mod assist, +2 physical assistance, +2 safety/equipment   Squat pivot transfers: Max assist, +2 physical assistance, +2 safety/equipment       General transfer comment: cueing for hand placement and safety, PT supporting R LE to ensure TDWB.  Pt able to stand on R LE but demonstrates difficulty pivoting on LE overall requiring max assist +2 to pivot into recliner.      Balance Overall balance assessment: Needs assistance Sitting-balance support: No upper extremity supported, Feet supported Sitting balance-Leahy Scale: Fair     Standing balance support: Bilateral upper extremity supported, During functional activity Standing balance-Leahy Scale: Poor Standing balance comment: relies on UE and external support                           ADL either performed or assessed with clinical judgement   ADL Overall ADL's : Needs assistance/impaired     Grooming: Set up;Sitting           Upper Body Dressing : Set  up;Sitting   Lower Body Dressing: Total assistance;+2 for physical assistance;+2 for safety/equipment;Sit to/from stand   Toilet Transfer: Maximal assistance;+2 for physical assistance;+2 for safety/equipment;Squat-pivot Toilet Transfer Details (indicate cue type and reason):  simulated to recliner         Functional mobility during ADLs: Moderate assistance;Maximal assistance;+2 for physical assistance;+2 for safety/equipment;Cueing for sequencing;Cueing for safety;Rolling walker (2 wheels)       Vision   Vision Assessment?: No apparent visual deficits     Perception     Praxis      Pertinent Vitals/Pain Pain Assessment Pain Assessment: Faces Faces Pain Scale: Hurts little more Pain Location: R LE Pain Descriptors / Indicators: Discomfort, Grimacing, Guarding Pain Intervention(s): Limited activity within patient's tolerance, Monitored during session, Premedicated before session, Repositioned     Hand Dominance Right   Extremity/Trunk Assessment Upper Extremity Assessment Upper Extremity Assessment: Generalized weakness;RUE deficits/detail RUE Deficits / Details: hx of recent shoulder arthroplasty after fall 10/23, pt reports no further restrictions but limited AROM at shoulder to approx 80*, education on preventing shoulder elevation when working on ROM exercises RUE Coordination: decreased gross motor   Lower Extremity Assessment Lower Extremity Assessment: Defer to PT evaluation       Communication Communication Communication: No difficulties   Cognition Arousal/Alertness: Awake/alert Behavior During Therapy: WFL for tasks assessed/performed Overall Cognitive Status: Within Functional Limits for tasks assessed                                       General Comments  daughter in law present and supportive.  VSS on RA    Exercises     Shoulder Instructions      Home Living Family/patient expects to be discharged to:: Private residence Living Arrangements: Alone Available Help at Discharge: Family;Available PRN/intermittently Type of Home: Apartment Home Access: Level entry     Home Layout: One level     Bathroom Shower/Tub: Teacher, early years/pre: Standard (with BSC over toilet) Bathroom  Accessibility: No How Accessible: Accessible via walker (fits in one bathroom but not the one she uses most often) Home Equipment: Rolling Walker (2 wheels);Grab bars - tub/shower;Hand held shower head;Rollator (4 wheels);Cane - single point;BSC/3in1          Prior Functioning/Environment Prior Level of Function : Independent/Modified Independent             Mobility Comments: rollator for mobility ADLs Comments: independent ADLs, IADLs with family support        OT Problem List: Decreased strength;Decreased activity tolerance;Impaired balance (sitting and/or standing);Decreased range of motion;Decreased knowledge of use of DME or AE;Decreased knowledge of precautions;Pain      OT Treatment/Interventions: Self-care/ADL training;Therapeutic exercise;DME and/or AE instruction;Therapeutic activities;Patient/family education;Balance training    OT Goals(Current goals can be found in the care plan section) Acute Rehab OT Goals Patient Stated Goal: rehab OT Goal Formulation: With patient Time For Goal Achievement: 09/05/22 Potential to Achieve Goals: Good  OT Frequency: Min 2X/week    Co-evaluation PT/OT/SLP Co-Evaluation/Treatment: Yes Reason for Co-Treatment: For patient/therapist safety;To address functional/ADL transfers   OT goals addressed during session: ADL's and self-care      AM-PAC OT "6 Clicks" Daily Activity     Outcome Measure Help from another person eating meals?: None Help from another person taking care of personal grooming?: A Little Help from another person toileting, which includes using toliet, bedpan, or urinal?: A Lot  Help from another person bathing (including washing, rinsing, drying)?: A Lot Help from another person to put on and taking off regular upper body clothing?: A Little Help from another person to put on and taking off regular lower body clothing?: A Lot 6 Click Score: 16   End of Session Equipment Utilized During Treatment: Gait  belt;Rolling walker (2 wheels) Nurse Communication: Mobility status  Activity Tolerance: Patient tolerated treatment well Patient left: in chair;with call bell/phone within reach;with chair alarm set;with family/visitor present  OT Visit Diagnosis: Other abnormalities of gait and mobility (R26.89);Muscle weakness (generalized) (M62.81);Pain Pain - Right/Left: Right Pain - part of body: Leg                Time: 1013-1040 OT Time Calculation (min): 27 min Charges:  OT General Charges $OT Visit: 1 Visit OT Evaluation $OT Eval Moderate Complexity: 1 Mod  Barry Brunner, OT Acute Rehabilitation Services Office [phone]   Chancy Milroy 08/22/2022, 11:27 AM

## 2022-08-22 NOTE — Anesthesia Postprocedure Evaluation (Signed)
Anesthesia Post Note  Patient: Carrie Barnes  Procedure(s) Performed: RIGHT OPEN REDUCTION INTERNAL FIXATION (ORIF) DISTAL FEMUR FRACTURE (Right)     Patient location during evaluation: PACU Anesthesia Type: General Level of consciousness: awake and alert Pain management: pain level controlled Vital Signs Assessment: post-procedure vital signs reviewed and stable Respiratory status: spontaneous breathing, nonlabored ventilation and respiratory function stable Cardiovascular status: blood pressure returned to baseline and stable Postop Assessment: no apparent nausea or vomiting Anesthetic complications: no  No notable events documented.  Last Vitals:  Vitals:   08/22/22 1400 08/22/22 1515  BP: (!) 96/51 (!) 118/49  Pulse: 76 82  Resp: 17 16  Temp:  36.8 C  SpO2: 95% 95%    Last Pain:  Vitals:   08/22/22 1515  TempSrc: Oral  PainSc:                  Tereza Gilham

## 2022-08-23 DIAGNOSIS — S72421A Displaced fracture of lateral condyle of right femur, initial encounter for closed fracture: Secondary | ICD-10-CM | POA: Diagnosis not present

## 2022-08-23 DIAGNOSIS — S72431A Displaced fracture of medial condyle of right femur, initial encounter for closed fracture: Secondary | ICD-10-CM | POA: Diagnosis not present

## 2022-08-23 LAB — CBC
HCT: 24.5 % — ABNORMAL LOW (ref 36.0–46.0)
Hemoglobin: 8.3 g/dL — ABNORMAL LOW (ref 12.0–15.0)
MCH: 29.9 pg (ref 26.0–34.0)
MCHC: 33.9 g/dL (ref 30.0–36.0)
MCV: 88.1 fL (ref 80.0–100.0)
Platelets: 230 10*3/uL (ref 150–400)
RBC: 2.78 MIL/uL — ABNORMAL LOW (ref 3.87–5.11)
RDW: 14.6 % (ref 11.5–15.5)
WBC: 11.1 10*3/uL — ABNORMAL HIGH (ref 4.0–10.5)
nRBC: 0 % (ref 0.0–0.2)

## 2022-08-23 MED ORDER — BISACODYL 10 MG RE SUPP: 10.0000 mg | Freq: Every day | RECTAL | Status: DC | PRN

## 2022-08-23 MED ORDER — POLYVINYL ALCOHOL 1.4 % OP SOLN
1.0000 [drp] | Freq: Two times a day (BID) | OPHTHALMIC | Status: DC
  Administered 2022-08-23 – 2022-08-24 (×3): 1 [drp] via OPHTHALMIC

## 2022-08-23 MED ORDER — SENNOSIDES-DOCUSATE SODIUM 8.6-50 MG PO TABS
1.0000 | ORAL_TABLET | Freq: Every day | ORAL | Status: DC
  Administered 2022-08-23 – 2022-08-24 (×2): 1 via ORAL
  Filled 2022-08-23 (×2): qty 1

## 2022-08-23 NOTE — Progress Notes (Addendum)
PROGRESS NOTE  Carrie Barnes  DOB: 16-Jul-1936  PCP: Seward Carol, MD IWP:[id number]  DOA: 08/20/2022  LOS: 3 days  Hospital Day: 4  Brief narrative: Carrie Barnes is a 87 y.o. female with PMH significant for HTN, HLD, A-fib on Eliquis, CAD/stents, ischemic cardiomyopathy, GERD, DJD who usually walks with a walker  1/10, patient had a mechanical fall onto her right knee with immediate severe pain and inability to bear weight. Imaging in the ED showed right intra-articular distal femoral fracture.  Orthopedics was consulted Admitted to [facility] 1/11, underwent ORIF of right distal femur fracture  Subjective: Patient was seen and examined this morning. Propped up in bed.  Not in distress.  Family at bedside. Telemetry reviewed.  In normal sinus rhythm in last 24 hours Blood pressure improved Reports no BM in several days  Assessment and plan: Right intra-articular distal femur fracture S/p ORIF by orthopedics 1/11 Norco for pain control. On Eliquis Vitamin D level adequate at 40.  A-fib with RVR 1/11, patient was in A-fib with RVR intraoperatively requiring Cardizem drip.  Subsequently taken off Cardizem drip.  Resumed home regimen of Cardizem 120 mg daily and Toprol 50 mg at bedtime.  Telemetry reviewed remains in normal sinus rhythm.   Chronically anticoagulated with Eliquis.  Eliquis resumed  CAD/HLD No anginal symptoms.  Continue statin  Essential hypertension Blood pressure improving last 24 hours.  Stop IV fluid today.  Encourage oral hydration Continue to monitor blood pressure on Cardizem and metoprolol.  Hyponatremia Mild.  Improved Recent Labs  Lab 08/20/22 1602 08/21/22 0322 08/22/22 0140  NA 129* 130* 135   Constipation Senokot scheduled, as needed MiraLAX and Dulcolax on   Mobility: PT eval obtained.  SNF recommended  Goals of care   Code Status: Full Code    Scheduled Meds:  apixaban  5 mg Oral BID   atorvastatin  80 mg Oral QHS    diltiazem  120 mg Oral Daily   latanoprost  1 drop Both Eyes QHS   metoprolol succinate  50 mg Oral QHS   mupirocin ointment  1 Application Nasal BID   pantoprazole  40 mg Oral Daily   polyvinyl alcohol  1 drop Right Eye BID   senna-docusate  1 tablet Oral QHS   timolol  1 drop Left Eye BID    PRN meds: acetaminophen, bisacodyl, HYDROcodone-acetaminophen, HYDROcodone-acetaminophen, lip balm, metoCLOPramide **OR** metoCLOPramide (REGLAN) injection, morphine injection, ondansetron **OR** ondansetron (ZOFRAN) IV, polyethylene glycol   Infusions:     Skin assessment:     Nutritional status:  Body mass index is 27.82 kg/m.          Diet:  Diet Order             Diet Heart Room service appropriate? Yes; Fluid consistency: Thin  Diet effective now                   DVT prophylaxis:  SCDs Start: 08/21/22 1655 Place TED hose Start: 08/21/22 0102 apixaban (ELIQUIS) tablet 5 mg   Antimicrobials: None Fluid: NS at 7 mill per hour Consultants: Orthopedics Family Communication: None at bedside  Status is: Inpatient  Continue in-hospital care because: Pending SNF Level of care: Progressive   Dispo: The patient is from: Home              Anticipated d/c is to: Medically stable for SNF   Difficult to place patient No    Antimicrobials: Anti-infectives (From admission, onward)    Start  Dose/Rate Route Frequency Ordered Stop   08/21/22 1900  ceFAZolin (ANCEF) IVPB 2g/100 mL premix  Status:  Discontinued        2 g 200 mL/hr over 30 Minutes Intravenous Every 8 hours 08/21/22 1654 08/21/22 1658   08/21/22 1800  ceFAZolin (ANCEF) IVPB 2g/100 mL premix        2 g 200 mL/hr over 30 Minutes Intravenous Every 8 hours 08/21/22 1658 08/22/22 0846   08/21/22 1045  vancomycin (VANCOCIN) powder  Status:  Discontinued          As needed 08/21/22 1046 08/21/22 1157   08/21/22 0915  ceFAZolin (ANCEF) IVPB 2g/100 mL premix        2 g 200 mL/hr over 30 Minutes Intravenous  On call to O.R. 08/21/22 0816 08/21/22 1020   08/21/22 0812  ceFAZolin (ANCEF) 2-4 GM/100ML-% IVPB       Note to Pharmacy: Brandt Loosen, [name]: cabinet override      08/21/22 0812 08/21/22 1015       Objective: Vitals:   08/23/22 0800 08/23/22 1209  BP: 109/73 (!) 120/58  Pulse: 86 84  Resp: 20 15  Temp: 98.3 F (36.8 C) 98.3 F (36.8 C)  SpO2: 93% 91%    Intake/Output Summary (Last 24 hours) at 08/23/2022 1340 Last data filed at 08/23/2022 0800 Gross per 24 hour  Intake 1963.44 ml  Output 2050 ml  Net -86.56 ml   Filed Weights   08/20/22 1530  Weight: 69 kg   Weight change:  Body mass index is 27.82 kg/m.   Physical Exam: General exam: Pleasant, elderly Caucasian female.  Not in pain Skin: No rashes, lesions or ulcers. HEENT: Atraumatic, normocephalic, no obvious bleeding Lungs: Clear to auscultation bilaterally CVS: Regular rate and rhythm, no murmur GI/Abd soft, nontender, nondistended, bowel sound present CNS: Alert, awake, oriented x 3 Psychiatry: Mood appropriate Extremities: No pedal edema, no calf tenderness  Data Review: I have personally reviewed the laboratory data and studies available.  F/u labs ordered Unresulted Labs (From admission, onward)    None       Total time spent in review of labs and imaging, patient evaluation, formulation of plan, documentation and communication with family: 25 minutes  Signed, Terrilee Croak, MD [facility] 08/23/2022

## 2022-08-23 NOTE — NC FL2 (Signed)
[place] LEVEL OF CARE FORM     IDENTIFICATION  Patient Name: Carrie Barnes Birthdate: Apr 17, 1936 Sex: female Admission Date (Current Location): 08/20/2022  [facility] and [place] Number:  Herbalist and Address:  [facility] [place], [place] [address], [place], [place] [address]      Provider Number: [id number]  Attending Physician Name and Address:  Terrilee Croak, MD  Relative Name and Phone Number:  Deeann Cree [phone]    Current Level of Care: Hospital Recommended Level of Care: [place] Prior Approval Number:    Date Approved/Denied:   PASRR Number: [id number]  Discharge Plan: SNF    Current Diagnoses: Patient Active Problem List   Diagnosis Date Noted   Closed bicondylar fracture of distal femur ([place]) 08/20/2022   Hyponatremia 08/20/2022   Hypomagnesemia 05/13/2022   Pelvis fracture ([place]) 05/12/2022   Humeral head fracture, right, closed, initial encounter 05/12/2022   Elevated troponin 05/12/2022   Hematoma 05/12/2022   Hypertension 11/05/2021   Complete rotator cuff tear 09/20/2021   Closed intertrochanteric fracture of right hip ([place]) 07/11/2019   Closed right hip fracture ([place]) 07/11/2019   Chronic cholecystitis due to cholelithiasis with choledocholithiasis 11/05/2017   Cholelithiasis with chronic cholecystitis 11/01/2017   Cholelithiasis 05/18/2017   CAD in native artery 03/31/2017   Ischemic cardiomyopathy 03/31/2017   Hyperlipemia 03/31/2017   DJD (degenerative joint disease) 03/31/2017   Osteoporosis 03/31/2017    Orientation RESPIRATION BLADDER Height & Weight     Time, Situation, Self, Place  Normal External catheter Weight: 152 lb 1.9 oz (69 kg) Height:  5\' 2"  (157.5 cm)  BEHAVIORAL SYMPTOMS/MOOD NEUROLOGICAL BOWEL NUTRITION STATUS      Continent Diet (see discharge summary)  AMBULATORY STATUS COMMUNICATION OF NEEDS Skin   Limited Assist Verbally Other (Comment), Surgical wounds  (Ace Wrap RLE)                       Personal Care Assistance Level of Assistance  Dressing, Bathing Bathing Assistance: Limited assistance   Dressing Assistance: Limited assistance     Functional Limitations Info  Sight Sight Info: Impaired        SPECIAL CARE FACTORS FREQUENCY  PT (By licensed PT), OT (By licensed OT)     PT Frequency: 4-5x/wk OT Frequency: 4-5x/wk            Contractures      Additional Factors Info  Code Status Code Status Info: FULL             Current Medications (08/23/2022):  This is the current hospital active medication list Current Facility-Administered Medications  Medication Dose Route Frequency Provider Last Rate Last Admin   0.9 %  sodium chloride infusion   Intravenous Continuous Dahal, Binaya, MD 75 mL/hr at 08/23/22 0711 Infusion Verify at 08/23/22 0711   acetaminophen (TYLENOL) tablet 325-650 mg  325-650 mg Oral Q6H PRN Corinne Ports, PA-C       apixaban (ELIQUIS) tablet 5 mg  5 mg Oral BID Terrilee Croak, MD   5 mg at 08/23/22 0803   atorvastatin (LIPITOR) tablet 80 mg  80 mg Oral QHS Corinne Ports, PA-C   80 mg at 08/22/22 2155   diltiazem (CARDIZEM CD) 24 hr capsule 120 mg  120 mg Oral Daily Corinne Ports, PA-C   120 mg at 08/23/22 0093   docusate sodium (COLACE) capsule 100 mg  100 mg Oral BID Corinne Ports, PA-C  100 mg at 08/23/22 0803   HYDROcodone-acetaminophen (NORCO) 7.5-325 MG per tablet 1-2 tablet  1-2 tablet Oral Q4H PRN Corinne Ports, PA-C   1 tablet at 08/23/22 0021   HYDROcodone-acetaminophen (NORCO/VICODIN) 5-325 MG per tablet 1-2 tablet  1-2 tablet Oral Q4H PRN Corinne Ports, PA-C   2 tablet at 08/23/22 1118   latanoprost (XALATAN) 0.005 % ophthalmic solution 1 drop  1 drop Both Eyes QHS Dahal, Marlowe Aschoff, MD   1 drop at 08/22/22 2154   lip balm (CARMEX) ointment   Topical PRN Dahal, Marlowe Aschoff, MD       metoCLOPramide (REGLAN) tablet 5-10 mg  5-10 mg Oral Q8H PRN Thereasa Solo, Sarah A, PA-C       Or    metoCLOPramide (REGLAN) injection 5-10 mg  5-10 mg Intravenous Q8H PRN Corinne Ports, PA-C       metoprolol succinate (TOPROL-XL) 24 hr tablet 50 mg  50 mg Oral QHS Dahal, Marlowe Aschoff, MD   50 mg at 08/22/22 2155   morphine (PF) 2 MG/ML injection 0.5-1 mg  0.5-1 mg Intravenous Q2H PRN Corinne Ports, PA-C       mupirocin ointment (BACTROBAN) 2 % 1 Application  1 Application Nasal BID Corinne Ports, PA-C   1 Application at 44/03/47 0802   ondansetron (ZOFRAN) tablet 4 mg  4 mg Oral Q6H PRN Corinne Ports, PA-C       Or   ondansetron (ZOFRAN) injection 4 mg  4 mg Intravenous Q6H PRN McClung, Sarah A, PA-C       pantoprazole (PROTONIX) EC tablet 40 mg  40 mg Oral Daily Dahal, Binaya, MD   40 mg at 08/23/22 0803   polyethylene glycol (MIRALAX / GLYCOLAX) packet 17 g  17 g Oral Daily PRN Thereasa Solo, Sarah A, PA-C       polyvinyl alcohol (LIQUIFILM TEARS) 1.4 % ophthalmic solution 1 drop  1 drop Right Eye BID Dahal, Binaya, MD       timolol (TIMOPTIC) 0.5 % ophthalmic solution 1 drop  1 drop Left Eye BID Terrilee Croak, MD   1 drop at 08/23/22 0803   Facility-Administered Medications Ordered in Other Encounters  Medication Dose Route Frequency Provider Last Rate Last Admin   indomethacin (INDOCIN) 50 MG suppository 50 mg  50 mg Rectal Once Arta Silence, MD         Discharge Medications: Please see discharge summary for a list of discharge medications.  Relevant Imaging Results:  Relevant Lab Results:   Additional Information SSN [id number]  [place], LCSWA

## 2022-08-23 NOTE — Progress Notes (Signed)
Orthopedic Tech Progress Note Patient Details:  Carrie Barnes 08/27/1935 [id number]  Ortho Devices Type of Ortho Device: Bone foam zero knee Ortho Device/Splint Location: RLE Ortho Device/Splint Interventions: Application   Post Interventions Patient Tolerated: Well  Linus Salmons Gilad Dugger 08/23/2022, 2:52 PM

## 2022-08-23 NOTE — Progress Notes (Signed)
Orthopaedic Trauma Service Progress Note  Patient ID: Carrie Barnes MRN: [id number] DOB/AGE: Nov 25, 1935 87 y.o.  Subjective:  Doing well, no complaints Awaiting SNF Pain tolerable   ROS As above  Objective:   VITALS:   Vitals:   08/23/22 0016 08/23/22 0500 08/23/22 0800 08/23/22 1209  BP: (!) 124/56 (!) 104/50 109/73 (!) 120/58  Pulse: 100 74 86 84  Resp: 20 13 20 15   Temp: 98.2 F (36.8 C) 98 F (36.7 C) 98.3 F (36.8 C) 98.3 F (36.8 C)  TempSrc: Oral Oral Oral Oral  SpO2: 94% 96% 93% 91%  Weight:      Height:        Estimated body mass index is 27.82 kg/m as calculated from the following:   Height as of this encounter: 5\' 2"  (1.575 m).   Weight as of this encounter: 69 kg.   Intake/Output      01/12 0701 01/13 0700 01/13 0701 01/14 0700   P.O.     I.V. (mL/kg)  1963.4 (28.5)   Blood     IV Piggyback     Total Intake(mL/kg)  1963.4 (28.5)   Urine (mL/kg/hr) 2250 (1.4) 500 (1.3)   Blood     Total Output 2250 500   Net -2250 +1463.4          LABS  Results for orders placed or performed during the hospital encounter of 08/20/22 (from the past 24 hour(s))  CBC     Status: Abnormal   Collection Time: 08/23/22 12:52 AM  Result Value Ref Range   WBC 11.1 (H) 4.0 - 10.5 K/uL   RBC 2.78 (L) 3.87 - 5.11 MIL/uL   Hemoglobin 8.3 (L) 12.0 - 15.0 g/dL   HCT 24.5 (L) 36.0 - 46.0 %   MCV 88.1 80.0 - 100.0 fL   MCH 29.9 26.0 - 34.0 pg   MCHC 33.9 30.0 - 36.0 g/dL   RDW 14.6 11.5 - 15.5 %   Platelets 230 150 - 400 K/uL   nRBC 0.0 0.0 - 0.2 %     PHYSICAL EXAM:   Gen: Resting comfortably in bed, no acute distress, appears well.  Very pleasant Lungs: Unlabored Ext:       Right lower extremity  Dressings removed   Incisions are healing nicely, no signs of infection and no drainage  Swelling is stable  Appropriate degree of tenderness over her distal femur and knee  Extremity  is warm  + DP pulse  No deep calf tenderness  Good perfusion distally  DPN, SPN, TN sensory functions intact  EHL, FHL, lesser toe motor functions intact.  Ankle flexion, extension, inversion eversion intact  Patient was resting with her knee in flexion.  We reviewed that she should rest with her knee in extension to prevent contracture       Assessment/Plan: 2 Days Post-Op     Anti-infectives (From admission, onward)    Start     Dose/Rate Route Frequency Ordered Stop   08/21/22 1900  ceFAZolin (ANCEF) IVPB 2g/100 mL premix  Status:  Discontinued        2 g 200 mL/hr over 30 Minutes Intravenous Every 8 hours 08/21/22 1654 08/21/22 1658   08/21/22 1800  ceFAZolin (ANCEF) IVPB 2g/100 mL premix        2 g 200 mL/hr  over 30 Minutes Intravenous Every 8 hours 08/21/22 1658 08/22/22 0846   08/21/22 1045  vancomycin (VANCOCIN) powder  Status:  Discontinued          As needed 08/21/22 1046 08/21/22 1157   08/21/22 0915  ceFAZolin (ANCEF) IVPB 2g/100 mL premix        2 g 200 mL/hr over 30 Minutes Intravenous On call to O.R. 08/21/22 0816 08/21/22 1020   08/21/22 0812  ceFAZolin (ANCEF) 2-4 GM/100ML-% IVPB       Note to Pharmacy: Carrie Barnes, Carrie Barnes: cabinet override      08/21/22 0812 08/21/22 1015     .  POD/HD#: 2   Carrie Barnes is a 87 y.o. female, 2 Days Post-Op s/p OPEN REDUCTION INTERNAL FIXATION RIGHT DISTAL FEMUR FRACTURE   CV/Blood loss: Acute blood loss anemia, stabilize. Transfused with 1 unit PRBC yesterday    PLAN: Weightbearing: TDWB RLE ROM: Okay for unrestricted knee ROM Incisional and dressing care: Reinforce dressings as needed, okay to leave incisions open to the air.  Okay to use TED hose if swelling increases Showering: ok to shower and clean with soap and water.  Do not submerge in bathtub Orthopedic device(s): Bone foam to help maintain full knee extension at rest Pain management:  1. Tylenol 325-650 mg q 6 hours PRN 2. Norco 5-325 mg OR 7.5-325 mg (1-2  tab) q 4 hours PRN 3. Morphine 0.5-1 mg q 2 hours PRN VTE prophylaxis: eliquis , SCDs ID:  Ancef 2gm post op Foley/Lines:  No foley, KVO IVFs, NO PURE WICK Impediments to Fracture Healing: Vitamin D level rechecked on 08/21/2022, remains at appropriate level 40.6.  No supplementation indicated Dispo: PT/OT evaluation today, dispo pending.  Patient does live alone so will likely need SNF at discharge.  Her preference is for [facility].  Continue to monitor CBC.  Okay for discharge from ortho standpoint once cleared by medicine team and therapies.  Follow-up with orthopedics in 2 weeks for suture removal and x-rays   D/C recommendations: - Norco for pain control - Home dose Eliquis for DVT prophylaxis - No need for additional Vit D supplementation   Follow - up plan: 2 weeks after d/c for wound check and repeat x-rays    Carrie Pigg, PA-C [phone] (C) 08/23/2022, 12:36 PM  [facility] [place] [phone] Jenetta [phone] (F)    After 5pm and on the weekends please log on to Amion, go to orthopaedics and the look under the [facility] Call for the provider(s) on call. You can also call our office at [phone] and then follow the prompts to be connected to the call team.  Patient ID: Carrie Barnes, female   DOB: May 24, 1936, 87 y.o.   MRN: [id number]

## 2022-08-23 NOTE — TOC Initial Note (Signed)
Transition of Care [facility]) - Initial/Assessment Note    Patient Details  Name: Carrie Barnes MRN: [id number] Date of Birth: 1935-12-28  [facility]) CM/SW Contact:    Ina Homes, [place] Phone Number: 08/23/2022, 1:17 PM  Clinical Narrative:                  SW met with pt at bedside. Pt confirmed demographics. Pt reports lives alone, iADL and uses rollator. Pt also has access to r/w. SW discussed PT recs for SNF. Pt agreeable. Pt prefers [facility], was there in October 2023. SW left list of other facilities in area, in case [facility] unable to accept.   Expected Discharge Plan: Skilled Nursing Facility Barriers to Discharge: Insurance Authorization, SNF Pending bed offer   Patient Goals and CMS Choice Patient states their goals for this hospitalization and ongoing recovery are:: return home CMS Medicare.gov Compare Post Acute Care list provided to:: Patient Choice offered to / list presented to : Patient      Expected Discharge Plan and Services     Post Acute Care Choice: [place] Living arrangements for the past 2 months: Apartment                                      Prior Living Arrangements/Services Living arrangements for the past 2 months: Apartment Lives with:: Self Patient language and need for interpreter reviewed:: Yes Do you feel safe going back to the place where you live?: Yes      Need for Family Participation in Patient Care: Yes (Comment) Care giver support system in place?: Yes (comment) Current home services: DME (r/w; rollator) Criminal Activity/Legal Involvement Pertinent to Current Situation/Hospitalization: No - Comment as needed  Activities of Daily Living Home Assistive Devices/Equipment: Walker (specify type) ADL Screening (condition at time of admission) Patient's cognitive ability adequate to safely complete daily activities?: Yes Is the patient deaf or have difficulty hearing?: No Does the patient have  difficulty seeing, even when wearing glasses/contacts?: No Does the patient have difficulty concentrating, remembering, or making decisions?: No Patient able to express need for assistance with ADLs?: Yes Does the patient have difficulty dressing or bathing?: Yes Independently performs ADLs?: Yes (appropriate for developmental age) Does the patient have difficulty walking or climbing stairs?: Yes Weakness of Legs: Right Weakness of Arms/Hands: None  Permission Sought/Granted Permission sought to share information with : Facility Sport and exercise psychologist, Family Supports Permission granted to share information with : Yes, Verbal Permission Granted  Share Information with NAME: Renate  Permission granted to share info w AGENCY: SNF  Permission granted to share info w Relationship: Daughter  Permission granted to share info w Contact Information: [phone]  Emotional Assessment Appearance:: Appears stated age Attitude/Demeanor/Rapport: Engaged Affect (typically observed): Accepting Orientation: : Oriented to Self, Oriented to Place, Oriented to  Time, Oriented to Situation   Psych Involvement: No (comment)  Admission diagnosis:  Closed bicondylar fracture of distal femur ([place]) [S72.423A, S72.433A] Closed fracture of distal end of right femur, unspecified fracture morphology, initial encounter ([place]) [S72.401A] Patient Active Problem List   Diagnosis Date Noted   Closed bicondylar fracture of distal femur ([place]) 08/20/2022   Hyponatremia 08/20/2022   Hypomagnesemia 05/13/2022   Pelvis fracture ([place]) 05/12/2022   Humeral head fracture, right, closed, initial encounter 05/12/2022   Elevated troponin 05/12/2022   Hematoma 05/12/2022   Hypertension 11/05/2021   Complete rotator cuff  tear 09/20/2021   Closed intertrochanteric fracture of right hip ([place]) 07/11/2019   Closed right hip fracture ([place]) 07/11/2019   Chronic cholecystitis due to cholelithiasis with choledocholithiasis  11/05/2017   Cholelithiasis with chronic cholecystitis 11/01/2017   Cholelithiasis 05/18/2017   CAD in native artery 03/31/2017   Ischemic cardiomyopathy 03/31/2017   Hyperlipemia 03/31/2017   DJD (degenerative joint disease) 03/31/2017   Osteoporosis 03/31/2017   PCP:  Seward Carol, MD Pharmacy:   [facility], [address] AT [address] [place] [address] Phone: [phone] Fax: [phone]     Social Determinants of Health (SDOH) Social History: SDOH Screenings   Food Insecurity: No Food Insecurity (08/21/2022)  Housing: Low Risk  (08/21/2022)  Transportation Needs: No Transportation Needs (08/21/2022)  Utilities: Not At Risk (08/21/2022)  Tobacco Use: Medium Risk (08/21/2022)   SDOH Interventions:     Readmission Risk Interventions     No data to display

## 2022-08-24 DIAGNOSIS — S72431A Displaced fracture of medial condyle of right femur, initial encounter for closed fracture: Secondary | ICD-10-CM | POA: Diagnosis not present

## 2022-08-24 DIAGNOSIS — S72421A Displaced fracture of lateral condyle of right femur, initial encounter for closed fracture: Secondary | ICD-10-CM | POA: Diagnosis not present

## 2022-08-24 NOTE — Progress Notes (Signed)
PROGRESS NOTE  Carrie Barnes  DOB: February 01, 1936  PCP: Carrie Carol, MD XHB:[id number]  DOA: 08/20/2022  LOS: 4 days  Hospital Day: 5  Brief narrative: Carrie Barnes is a 87 y.o. female with PMH significant for HTN, HLD, A-fib on Eliquis, CAD/stents, ischemic cardiomyopathy, GERD, DJD who usually walks with a walker  1/10, patient had a mechanical fall onto her right knee with immediate severe pain and inability to bear weight. Imaging in the ED showed right intra-articular distal femoral fracture.  Orthopedics was consulted Admitted to [facility] 1/11, underwent ORIF of right distal femur fracture  Subjective: Patient was seen and examined this morning. Propped up in bed.  Not in distress no new symptoms.  Remains in normal sinus rhythm.  Pending placement  Assessment and plan: Right intra-articular distal femur fracture S/p ORIF by orthopedics 1/11 Norco for pain control. On Eliquis Vitamin D level adequate at 40.  A-fib with RVR 1/11, patient was in A-fib with RVR intraoperatively requiring Cardizem drip.  Subsequently taken off Cardizem drip.  Resumed home regimen of Cardizem 120 mg daily and Toprol 50 mg at bedtime.  Telemetry reviewed remains in normal sinus rhythm for the last 48 hours Chronically anticoagulated with Eliquis.  Eliquis resumed  CAD/HLD No anginal symptoms.  Continue statin  Essential hypertension Blood pressure improving last 24 hours.  Stop IV fluid today.  Encourage oral hydration Continue to monitor blood pressure on Cardizem and metoprolol.  Hyponatremia Mild.  Improved Recent Labs  Lab 08/20/22 1602 08/21/22 0322 08/22/22 0140  NA 129* 130* 135   Constipation Senokot scheduled, as needed MiraLAX and Dulcolax on.  Mobility: PT eval obtained.  SNF recommended  Goals of care   Code Status: Full Code    Scheduled Meds:  apixaban  5 mg Oral BID   atorvastatin  80 mg Oral QHS   diltiazem  120 mg Oral Daily   latanoprost  1 drop Both  Eyes QHS   metoprolol succinate  50 mg Oral QHS   mupirocin ointment  1 Application Nasal BID   pantoprazole  40 mg Oral Daily   polyvinyl alcohol  1 drop Right Eye BID   senna-docusate  1 tablet Oral QHS   timolol  1 drop Left Eye BID    PRN meds: acetaminophen, bisacodyl, HYDROcodone-acetaminophen, HYDROcodone-acetaminophen, lip balm, metoCLOPramide **OR** metoCLOPramide (REGLAN) injection, morphine injection, ondansetron **OR** ondansetron (ZOFRAN) IV, polyethylene glycol   Infusions:     Skin assessment:     Nutritional status:  Body mass index is 27.82 kg/m.          Diet:  Diet Order             Diet Heart Room service appropriate? Yes; Fluid consistency: Thin  Diet effective now                   DVT prophylaxis:  SCDs Start: 08/21/22 1655 Place TED hose Start: 08/21/22 0102 apixaban (ELIQUIS) tablet 5 mg   Antimicrobials: None Fluid: Off IV fluid Consultants: Orthopedics Family Communication: None at bedside  Status is: Inpatient  Continue in-hospital care because: Pending SNF Level of care: Progressive   Dispo: The patient is from: Home              Anticipated d/c is to: Medically stable for SNF   Difficult to place patient No    Antimicrobials: Anti-infectives (From admission, onward)    Start     Dose/Rate Route Frequency Ordered Stop   08/21/22 1900  ceFAZolin (ANCEF) IVPB 2g/100 mL premix  Status:  Discontinued        2 g 200 mL/hr over 30 Minutes Intravenous Every 8 hours 08/21/22 1654 08/21/22 1658   08/21/22 1800  ceFAZolin (ANCEF) IVPB 2g/100 mL premix        2 g 200 mL/hr over 30 Minutes Intravenous Every 8 hours 08/21/22 1658 08/22/22 0846   08/21/22 1045  vancomycin (VANCOCIN) powder  Status:  Discontinued          As needed 08/21/22 1046 08/21/22 1157   08/21/22 0915  ceFAZolin (ANCEF) IVPB 2g/100 mL premix        2 g 200 mL/hr over 30 Minutes Intravenous On call to O.R. 08/21/22 0816 08/21/22 1020   08/21/22 0812   ceFAZolin (ANCEF) 2-4 GM/100ML-% IVPB       Note to Pharmacy: Brandt Loosen, [name]: cabinet override      08/21/22 0812 08/21/22 1015       Objective: Vitals:   08/24/22 0804 08/24/22 1118  BP: (!) 114/55 (!) 110/56  Pulse: 78 82  Resp: 16 15  Temp: 97.9 F (36.6 C) 97.8 F (36.6 C)  SpO2: 100% 98%    Intake/Output Summary (Last 24 hours) at 08/24/2022 1156 Last data filed at 08/24/2022 0810 Gross per 24 hour  Intake 1057.89 ml  Output 2500 ml  Net -1442.11 ml   Filed Weights   08/20/22 1530  Weight: 69 kg   Weight change:  Body mass index is 27.82 kg/m.   Physical Exam: General exam: Pleasant, elderly Caucasian female.  Not in pain Skin: No rashes, lesions or ulcers. HEENT: Atraumatic, normocephalic, no obvious bleeding Lungs: Clear to auscultation bilaterally CVS: Regular rate and rhythm, no murmur GI/Abd soft, nontender, nondistended, bowel sound present CNS: Alert, awake, oriented x 3 Psychiatry: Mood appropriate Extremities: No pedal edema, no calf tenderness  Data Review: I have personally reviewed the laboratory data and studies available.  F/u labs ordered Unresulted Labs (From admission, onward)    None       Total time spent in review of labs and imaging, patient evaluation, formulation of plan, documentation and communication with family: 25 minutes  Signed, Terrilee Croak, MD [facility] 08/24/2022

## 2022-08-24 NOTE — TOC Progression Note (Addendum)
Transition of Care [facility]) - Progression Note    Patient Details  Name: Carrie Barnes MRN: [id number] Date of Birth: 1936/05/24  Transition of Care [facility]) CM/SW [place], [place] Phone Number: 08/24/2022, 12:27 PM  Clinical Narrative:     Update- CSW spoke with patients insurance and started insurance authorization for patient. Reference # [id number]. CSW faxed over clinicals for review.  Update- CSW provided patient with SNF bed offers. Patient chose SNF placement at [facility]. CSW will start insurance authorization for patient.   Update- [facility] called CSW back to inform her that that they can make SNF bed offer for patient. CSW will provide SNF bed offers.  CSW spoke with [facility] at [facility] who is going to look at patients referral to see if they can offer SNF bed for patient. CSW awaiting call back. CSW will continue to follow and assist with patients dc planning needs.  Expected Discharge Plan: Skilled Nursing Facility Barriers to Discharge: Ship broker, SNF Pending bed offer  Expected Discharge Plan and Services     Post Acute Care Choice: [place] arrangements for the past 2 months: Apartment                                       Social Determinants of Health (SDOH) Interventions SDOH Screenings   Food Insecurity: No Food Insecurity (08/21/2022)  Housing: Low Risk  (08/21/2022)  Transportation Needs: No Transportation Needs (08/21/2022)  Utilities: Not At Risk (08/21/2022)  Tobacco Use: Medium Risk (08/21/2022)    Readmission Risk Interventions     No data to display

## 2022-08-25 ENCOUNTER — Encounter (HOSPITAL_COMMUNITY): Payer: Self-pay | Admitting: Student

## 2022-08-25 DIAGNOSIS — I2510 Atherosclerotic heart disease of native coronary artery without angina pectoris: Secondary | ICD-10-CM | POA: Diagnosis not present

## 2022-08-25 DIAGNOSIS — R5381 Other malaise: Secondary | ICD-10-CM | POA: Diagnosis not present

## 2022-08-25 DIAGNOSIS — I5022 Chronic systolic (congestive) heart failure: Secondary | ICD-10-CM | POA: Diagnosis not present

## 2022-08-25 DIAGNOSIS — S72431A Displaced fracture of medial condyle of right femur, initial encounter for closed fracture: Secondary | ICD-10-CM | POA: Diagnosis not present

## 2022-08-25 DIAGNOSIS — Z7401 Bed confinement status: Secondary | ICD-10-CM | POA: Diagnosis not present

## 2022-08-25 DIAGNOSIS — G479 Sleep disorder, unspecified: Secondary | ICD-10-CM | POA: Diagnosis not present

## 2022-08-25 DIAGNOSIS — S72401D Unspecified fracture of lower end of right femur, subsequent encounter for closed fracture with routine healing: Secondary | ICD-10-CM | POA: Diagnosis not present

## 2022-08-25 DIAGNOSIS — Z7185 Encounter for immunization safety counseling: Secondary | ICD-10-CM | POA: Diagnosis not present

## 2022-08-25 DIAGNOSIS — T8189XA Other complications of procedures, not elsewhere classified, initial encounter: Secondary | ICD-10-CM | POA: Diagnosis not present

## 2022-08-25 DIAGNOSIS — Z189 Retained foreign body fragments, unspecified material: Secondary | ICD-10-CM | POA: Diagnosis not present

## 2022-08-25 DIAGNOSIS — S72001D Fracture of unspecified part of neck of right femur, subsequent encounter for closed fracture with routine healing: Secondary | ICD-10-CM | POA: Diagnosis not present

## 2022-08-25 DIAGNOSIS — S72401A Unspecified fracture of lower end of right femur, initial encounter for closed fracture: Secondary | ICD-10-CM | POA: Diagnosis not present

## 2022-08-25 DIAGNOSIS — I10 Essential (primary) hypertension: Secondary | ICD-10-CM | POA: Diagnosis not present

## 2022-08-25 DIAGNOSIS — R41841 Cognitive communication deficit: Secondary | ICD-10-CM | POA: Diagnosis not present

## 2022-08-25 DIAGNOSIS — R399 Unspecified symptoms and signs involving the genitourinary system: Secondary | ICD-10-CM | POA: Diagnosis not present

## 2022-08-25 DIAGNOSIS — N390 Urinary tract infection, site not specified: Secondary | ICD-10-CM | POA: Diagnosis not present

## 2022-08-25 DIAGNOSIS — R262 Difficulty in walking, not elsewhere classified: Secondary | ICD-10-CM | POA: Diagnosis not present

## 2022-08-25 DIAGNOSIS — D649 Anemia, unspecified: Secondary | ICD-10-CM | POA: Diagnosis not present

## 2022-08-25 DIAGNOSIS — R2689 Other abnormalities of gait and mobility: Secondary | ICD-10-CM | POA: Diagnosis not present

## 2022-08-25 DIAGNOSIS — R2681 Unsteadiness on feet: Secondary | ICD-10-CM | POA: Diagnosis not present

## 2022-08-25 DIAGNOSIS — E871 Hypo-osmolality and hyponatremia: Secondary | ICD-10-CM | POA: Diagnosis not present

## 2022-08-25 DIAGNOSIS — S79929A Unspecified injury of unspecified thigh, initial encounter: Secondary | ICD-10-CM | POA: Diagnosis not present

## 2022-08-25 DIAGNOSIS — H401122 Primary open-angle glaucoma, left eye, moderate stage: Secondary | ICD-10-CM | POA: Diagnosis not present

## 2022-08-25 DIAGNOSIS — M6281 Muscle weakness (generalized): Secondary | ICD-10-CM | POA: Diagnosis not present

## 2022-08-25 DIAGNOSIS — E785 Hyperlipidemia, unspecified: Secondary | ICD-10-CM | POA: Diagnosis not present

## 2022-08-25 DIAGNOSIS — S32401D Unspecified fracture of right acetabulum, subsequent encounter for fracture with routine healing: Secondary | ICD-10-CM | POA: Diagnosis not present

## 2022-08-25 DIAGNOSIS — Z9181 History of falling: Secondary | ICD-10-CM | POA: Diagnosis not present

## 2022-08-25 DIAGNOSIS — Z23 Encounter for immunization: Secondary | ICD-10-CM | POA: Diagnosis not present

## 2022-08-25 DIAGNOSIS — S72421A Displaced fracture of lateral condyle of right femur, initial encounter for closed fracture: Secondary | ICD-10-CM | POA: Diagnosis not present

## 2022-08-25 DIAGNOSIS — R339 Retention of urine, unspecified: Secondary | ICD-10-CM | POA: Diagnosis not present

## 2022-08-25 DIAGNOSIS — M1990 Unspecified osteoarthritis, unspecified site: Secondary | ICD-10-CM | POA: Diagnosis not present

## 2022-08-25 DIAGNOSIS — H04123 Dry eye syndrome of bilateral lacrimal glands: Secondary | ICD-10-CM | POA: Diagnosis not present

## 2022-08-25 DIAGNOSIS — I480 Paroxysmal atrial fibrillation: Secondary | ICD-10-CM | POA: Diagnosis not present

## 2022-08-25 MED ORDER — HYDROCODONE-ACETAMINOPHEN 7.5-325 MG PO TABS: 1.0000 | ORAL_TABLET | ORAL | 0 refills | Status: DC | PRN

## 2022-08-25 MED ORDER — SENNOSIDES-DOCUSATE SODIUM 8.6-50 MG PO TABS: 1.0000 | ORAL_TABLET | Freq: Every day | ORAL | Status: DC

## 2022-08-25 NOTE — Progress Notes (Signed)
PROGRESS NOTE  Carrie Barnes  DOB: 1936/05/20  PCP: Seward Carol, MD WUJ:[id number]  DOA: 08/20/2022  LOS: 5 days  Hospital Day: 6  Brief narrative: ALOHA BARTOK is a 87 y.o. female with PMH significant for HTN, HLD, A-fib on Eliquis, CAD/stents, ischemic cardiomyopathy, GERD, DJD who usually walks with a walker  1/10, patient had a mechanical fall onto her right knee with immediate severe pain and inability to bear weight. Imaging in the ED showed right intra-articular distal femoral fracture.  Orthopedics was consulted Admitted to [facility] 1/11, underwent ORIF of right distal femur fracture  Subjective: Patient was seen and examined this morning. Sitting up in recliner.  Not in distress.  Remains in normal sinus rhythm.  Blood pressure stable Pending placement.  Assessment and plan: Right intra-articular distal femur fracture S/p ORIF by orthopedics 1/11 Norco for pain control. On Eliquis Vitamin D level adequate at 40.  A-fib with RVR 1/11, patient was in A-fib with RVR intraoperatively requiring Cardizem drip.  Subsequently taken off Cardizem drip.  Resumed home regimen of Cardizem 120 mg daily and Toprol 50 mg at bedtime.  Telemetry reviewed remains in normal sinus rhythm for the last 48 hours Chronically anticoagulated with Eliquis.  Continue the same.  CAD/HLD No anginal symptoms.  Continue statin  Essential hypertension Blood pressure improving last 24 hours.  Not on IV hydration. Continue to monitor blood pressure on Cardizem and metoprolol.  Hyponatremia Mild.  Improved Recent Labs  Lab 08/20/22 1602 08/21/22 0322 08/22/22 0140  NA 129* 130* 135    Constipation Senokot scheduled, as needed MiraLAX and Dulcolax on.  Mobility: PT eval obtained.  SNF recommended  Goals of care   Code Status: Full Code    Scheduled Meds:  apixaban  5 mg Oral BID   atorvastatin  80 mg Oral QHS   diltiazem  120 mg Oral Daily   latanoprost  1 drop Both Eyes QHS    metoprolol succinate  50 mg Oral QHS   mupirocin ointment  1 Application Nasal BID   pantoprazole  40 mg Oral Daily   polyvinyl alcohol  1 drop Right Eye BID   senna-docusate  1 tablet Oral QHS   timolol  1 drop Left Eye BID    PRN meds: acetaminophen, bisacodyl, HYDROcodone-acetaminophen, HYDROcodone-acetaminophen, lip balm, metoCLOPramide **OR** metoCLOPramide (REGLAN) injection, morphine injection, ondansetron **OR** ondansetron (ZOFRAN) IV, polyethylene glycol   Infusions:     Skin assessment:     Nutritional status:  Body mass index is 27.82 kg/m.          Diet:  Diet Order             Diet Heart Room service appropriate? Yes; Fluid consistency: Thin  Diet effective now                   DVT prophylaxis:  SCDs Start: 08/21/22 1655 Place TED hose Start: 08/21/22 0102 apixaban (ELIQUIS) tablet 5 mg   Antimicrobials: None Fluid: Not on IV fluid Consultants: Orthopedics Family Communication: None at bedside  Status is: Inpatient  Continue in-hospital care because: Pending SNF Level of care: Progressive   Dispo: The patient is from: Home              Anticipated d/c is to: Medically stable for SNF   Difficult to place patient No    Antimicrobials: Anti-infectives (From admission, onward)    Start     Dose/Rate Route Frequency Ordered Stop   08/21/22 1900  ceFAZolin (ANCEF) IVPB 2g/100 mL premix  Status:  Discontinued        2 g 200 mL/hr over 30 Minutes Intravenous Every 8 hours 08/21/22 1654 08/21/22 1658   08/21/22 1800  ceFAZolin (ANCEF) IVPB 2g/100 mL premix        2 g 200 mL/hr over 30 Minutes Intravenous Every 8 hours 08/21/22 1658 08/22/22 0846   08/21/22 1045  vancomycin (VANCOCIN) powder  Status:  Discontinued          As needed 08/21/22 1046 08/21/22 1157   08/21/22 0915  ceFAZolin (ANCEF) IVPB 2g/100 mL premix        2 g 200 mL/hr over 30 Minutes Intravenous On call to O.R. 08/21/22 0816 08/21/22 1020   08/21/22 0812  ceFAZolin  (ANCEF) 2-4 GM/100ML-% IVPB       Note to Pharmacy: Carrie Barnes, Carrie Barnes: cabinet override      08/21/22 0812 08/21/22 1015       Objective: Vitals:   08/25/22 0300 08/25/22 0800  BP: (!) 105/55 136/62  Pulse: 80 87  Resp:  16  Temp: 98.5 F (36.9 C) 98.3 F (36.8 C)  SpO2: 98% 98%    Intake/Output Summary (Last 24 hours) at 08/25/2022 1039 Last data filed at 08/25/2022 7494 Gross per 24 hour  Intake 480 ml  Output 2800 ml  Net -2320 ml    Filed Weights   08/20/22 1530  Weight: 69 kg   Weight change:  Body mass index is 27.82 kg/m.   Physical Exam: General exam: Pleasant, elderly Caucasian female.  Not in pain Skin: No rashes, lesions or ulcers. HEENT: Atraumatic, normocephalic, no obvious bleeding Lungs: Clear to auscultation bilaterally CVS: Regular rate and rhythm, no murmur GI/Abd soft, nontender, nondistended, bowel sound present CNS: Alert, awake, oriented x 3 Psychiatry: Mood appropriate Extremities: No pedal edema, no calf tenderness  Data Review: I have personally reviewed the laboratory data and studies available.  F/u labs ordered Unresulted Labs (From admission, onward)    None       Total time spent in review of labs and imaging, patient evaluation, formulation of plan, documentation and communication with family: 25 minutes  Signed, Terrilee Croak, MD [facility] 08/25/2022

## 2022-08-25 NOTE — Progress Notes (Signed)
Orthopaedic Trauma Progress Note  SUBJECTIVE: Doing fairly well this morning, pain controlled.  No chest pain. No SOB. No nausea/vomiting. No other complaints.  Progressing well with therapies. Insurance Carrie Barnes has been started for Carrie Barnes [facility].    OBJECTIVE:  Vitals:   08/25/22 0300 08/25/22 0800  BP: (!) 105/55 136/62  Pulse: 80 87  Resp:  16  Temp: 98.5 F (36.9 C) 98.3 F (36.8 C)  SpO2: 98% 98%    General: Sitting up in bedside chair, no acute distress.  Pleasant and cooperative Respiratory: No increased work of breathing.  Right lower extremity: Incision clean, dry, intact.  No active drainage. Tender over the knee and throughout the distal thigh as expected.  Less tender throughout the lower leg.  Ankle DF/PF intact.  Endorses sensation to all aspects of the foot.  Toes warm and well-perfused. + DP pulse  IMAGING: Stable post op imaging.    LABS:  No results found for this or any previous visit (from the past 24 hour(s)).   ASSESSMENT: Carrie Barnes is a 87 y.o. female, 4 Days Post-Op s/p OPEN REDUCTION INTERNAL FIXATION RIGHT DISTAL FEMUR FRACTURE  CV/Blood loss: Acute blood loss anemia, Hgb 8.0 on 08/23/22. Hemodynamically stable  PLAN: Weightbearing: TDWB RLE ROM: Okay for unrestricted knee ROM Incisional and dressing care: Ok to leave incision open to air Showering: Ok to begin getting incisions wet Orthopedic device(s): No bracing required Pain management:  1. Tylenol 325-650 mg q 6 hours PRN 2. Norco 5-325 mg OR 7.5-325 mg (1-2 tab) q 4 hours PRN 3. Morphine 0.5-1 mg q 2 hours PRN VTE prophylaxis:  Restart Eliquis today , SCDs ID:  Ancef 2gm post op completed Foley/Lines:  No foley, KVO IVFs. NO PUREWICK Impediments to Fracture Healing: Vitamin D level rechecked on 08/21/2022, remains at appropriate level 40.6.  No supplementation indicated Dispo: PT/OT eval ongoing, currently recommending SNF. Patient has chosen Carrie Barnes, Carrie Barnes.  Okay for discharge from ortho standpoint once cleared by medicine team and therapies  D/C recommendations: - Norco for pain control --> have signed and placed this in chart - Home dose Eliquis for DVT prophylaxis - No need for additional Vit D supplementation  Follow - up plan: 2 weeks after d/c for wound check and repeat x-rays   Contact information:  Katha Hamming MD, Rushie Nyhan PA-C. After hours and holidays please check [email or web address] for group call information for [facility]   Gwinda Passe, PA-C [phone] (office) [email or web address]

## 2022-08-25 NOTE — TOC Progression Note (Signed)
Transition of Care [facility]) - Progression Note    Patient Details  Name: Carrie Barnes MRN: [id number] Date of Birth: 1936-03-23  Transition of Care [facility]) CM/SW [place], [place] Phone Number: 08/25/2022, 2:00 PM  Clinical Narrative:    Received insurance authorization for SNF- reference # [id number]  1/14- 1/17   SNF & medical team updated   Thurmond Butts, MSW, LCSW Clinical Social Worker    Expected Discharge Plan: [place] Barriers to Discharge: Insurance Authorization, SNF Pending bed offer  Expected Discharge Plan and [place] Choice: [place] arrangements for the past 2 months: Apartment                                       Social Determinants of Health (SDOH) Interventions SDOH Screenings   Food Insecurity: No Food Insecurity (08/21/2022)  Housing: Low Risk  (08/21/2022)  Transportation Needs: No Transportation Needs (08/21/2022)  Utilities: Not At Risk (08/21/2022)  Tobacco Use: Medium Risk (08/21/2022)    Readmission Risk Interventions     No data to display

## 2022-08-25 NOTE — TOC Transition Note (Addendum)
Transition of Care [facility]) - CM/SW Discharge Note   Patient Details  Name: Carrie Barnes MRN: [id number] Date of Birth: 1936-07-14  Transition of Care [facility]) CM/SW Contact:  Vinie Sill, LCSW Phone Number: 08/25/2022, 3:49 PM   Clinical Narrative:     Patient will Discharge to: [place]  Discharge Date:  08/25/22 Family Notified: daughter, Renate Transport By: Corey Harold  Per MD patient is ready for discharge. RN, patient, and facility notified of discharge. Discharge Summary sent to facility. RN given number for [phone], Room 808-P. Ambulance transport requested for patient.   Clinical Social Worker signing off.  Thurmond Butts, MSW, LCSW Clinical Social Worker     Final next level of care: Skilled Nursing Facility Barriers to Discharge: Barriers Resolved   Patient Goals and CMS Choice CMS Medicare.gov Compare Post Acute Care list provided to:: Patient Choice offered to / list presented to : Patient  Discharge Placement                Patient chooses bed at: [facility] Patient to be transferred to facility by: [place] Name of family member notified: daughter Patient and family notified of of transfer: 08/25/22  Discharge Plan and Services Additional resources added to the After Visit Summary for       Post Acute Care Choice: [place]                               Social Determinants of Health (SDOH) Interventions SDOH Screenings   Food Insecurity: No Food Insecurity (08/21/2022)  Housing: Low Risk  (08/21/2022)  Transportation Needs: No Transportation Needs (08/21/2022)  Utilities: Not At Risk (08/21/2022)  Tobacco Use: Medium Risk (08/21/2022)     Readmission Risk Interventions     No data to display

## 2022-08-25 NOTE — Discharge Summary (Signed)
Physician Discharge Summary  Carrie Barnes NLG:[id number] DOB: 06-15-1936 DOA: 08/20/2022  PCP: Carrie Dills, MD  Admit date: 08/20/2022 Discharge date: 08/25/2022  Admitted From: home Discharge disposition: rehab  Brief narrative: Carrie Barnes is a 87 y.o. female with PMH significant for HTN, HLD, A-fib on Eliquis, CAD/stents, ischemic cardiomyopathy, GERD, DJD who usually walks with a walker  1/10, patient had a mechanical fall onto her right knee with immediate severe pain and inability to bear weight. Imaging in the ED showed right intra-articular distal femoral fracture.  Orthopedics was consulted Admitted to [facility] 1/11, underwent ORIF of right distal femur fracture  Subjective: Patient was seen and examined this morning. Sitting up in recliner.  Not in distress.  Remains in normal sinus rhythm.  Blood pressure stable Pending placement.  Hospital course: Right intra-articular distal femur fracture S/p ORIF by orthopedics 1/11 Norco for pain control. On Eliquis Vitamin D level adequate at 40.  A-fib with RVR 1/11, patient was in A-fib with RVR intraoperatively requiring Cardizem drip.  Subsequently taken off Cardizem drip.  Resumed home regimen of Cardizem 120 mg daily and Toprol 50 mg at bedtime.  Telemetry reviewed remains in normal sinus rhythm for the last 48 hours Chronically anticoagulated with Eliquis.  Continue the same.  CAD/HLD No anginal symptoms.  Continue statin  Essential hypertension Blood pressure controlled on Cardizem and metoprolol.  Hyponatremia Mild.  Improved Recent Labs  Lab 08/20/22 1602 08/21/22 0322 08/22/22 0140  NA 129* 130* 135   Constipation Senokot scheduled, as needed MiraLAX and Dulcolax on.  Mobility: PT eval obtained.  SNF recommended  Goals of care   Code Status: Full Code   Wounds:  - Incision (Closed) 08/21/22 Leg Right (Active)  Date First Assessed/Time First Assessed: 08/21/22 1111   Location: Leg  Location  Orientation: Right    Assessments 08/21/2022 12:00 PM 08/25/2022  8:10 AM  Dressing Type Compression wrap --  Dressing Clean, Dry, Intact --  Site / Wound Assessment Dressing in place / Unable to assess --  Margins -- Attached edges (approximated)  Closure -- Approximated;Skin glue  Drainage Amount None None     No associated orders.    Discharge Exam:   Vitals:   08/24/22 2305 08/25/22 0300 08/25/22 0800 08/25/22 1200  BP: 129/62 (!) 105/55 136/62 97/74  Pulse: 86 80 87 82  Resp:   16 18  Temp: 97.8 F (36.6 C) 98.5 F (36.9 C) 98.3 F (36.8 C) 98.2 F (36.8 C)  TempSrc: Oral Oral Oral Oral  SpO2: 99% 98% 98% 97%  Weight:      Height:        Body mass index is 27.82 kg/m.   General exam: Pleasant, elderly Caucasian female.  Not in pain Skin: No rashes, lesions or ulcers. HEENT: Atraumatic, normocephalic, no obvious bleeding Lungs: Clear to auscultation bilaterally CVS: Regular rate and rhythm, no murmur GI/Abd soft, nontender, nondistended, bowel sound present CNS: Alert, awake, oriented x 3 Psychiatry: Mood appropriate Extremities: No pedal edema, no calf tenderness  Follow ups:    Follow-up Information     Barnes, Carrie Manners, MD. Schedule an appointment as soon as possible for a visit in 2 week(s).   Specialty: Orthopedic Surgery Why: for wound check and repeat x-rays Contact information: [address] [place] [address] [phone]         Carrie Dills, MD Follow up.   Specialty: Internal Medicine Contact information: [address] [place] [address]  [phone]                 Discharge Instructions:   Discharge Instructions     Call MD for:  difficulty breathing, headache or visual disturbances   Complete by: As directed    Call MD for:  extreme fatigue   Complete by: As directed    Call MD for:  hives   Complete by: As directed    Call MD for:  persistant dizziness or light-headedness   Complete by:  As directed    Call MD for:  persistant nausea and vomiting   Complete by: As directed    Call MD for:  severe uncontrolled pain   Complete by: As directed    Call MD for:  temperature >100.4   Complete by: As directed    Diet general   Complete by: As directed    Discharge instructions   Complete by: As directed    General discharge instructions: Follow with Primary MD Carrie Dills, MD in 7 days  Please request your PCP  to go over your hospital tests, procedures, radiology results at the follow up. Please get your medicines reviewed and adjusted.  Your PCP may decide to repeat certain labs or tests as needed. Do not drive, operate heavy machinery, perform activities at heights, swimming or participation in water activities or provide baby sitting services if your were admitted for syncope or siezures until you have seen by Primary MD or a Neurologist and advised to do so again. [place] Controlled Substance Reporting System database was reviewed. Do not drive, operate heavy machinery, perform activities at heights, swim, participate in water activities or provide baby-sitting services while on medications for pain, sleep and mood until your outpatient physician has reevaluated you and advised to do so again.  You are strongly recommended to comply with the dose, frequency and duration of prescribed medications. Activity: As tolerated with Full fall precautions use walker/cane & assistance as needed Avoid using any recreational substances like cigarette, tobacco, alcohol, or non-prescribed drug. If you experience worsening of your admission symptoms, develop shortness of breath, life threatening emergency, suicidal or homicidal thoughts you must seek medical attention immediately by calling 911 or calling your MD immediately  if symptoms less severe. You must read complete instructions/literature along with all the possible adverse reactions/side effects for all the medicines you take  and that have been prescribed to you. Take any new medicine only after you have completely understood and accepted all the possible adverse reactions/side effects.  Wear Seat belts while driving. You were cared for by a hospitalist during your hospital stay. If you have any questions about your discharge medications or the care you received while you were in the hospital after you are discharged, you can call the unit and ask to speak with the hospitalist or the covering physician. Once you are discharged, your primary care physician will handle any further medical issues. Please note that NO REFILLS for any discharge medications will be authorized once you are discharged, as it is imperative that you return to your primary care physician (or establish a relationship with a primary care physician if you do not have one).   Increase activity slowly   Complete by: As directed        Discharge Medications:   Allergies as of 08/25/2022   No Known Allergies      Medication List     STOP taking these medications    bisacodyl 10 MG suppository  Commonly known as: DULCOLAX   cetirizine 10 MG tablet Commonly known as: ZYRTEC   docusate sodium 100 MG capsule Commonly known as: Colace   feeding supplement Liqd   oxyCODONE 5 MG immediate release tablet Commonly known as: Oxy IR/ROXICODONE   senna 8.6 MG Tabs tablet Commonly known as: SENOKOT       TAKE these medications    acetaminophen 500 MG tablet Commonly known as: TYLENOL Take 1,000 mg by mouth See admin instructions. Take 1,000 mg by mouth at bedtime and an additional 1,000 mg once a day as needed for pain   apixaban 5 MG Tabs tablet Commonly known as: ELIQUIS Take 1 tablet (5 mg total) by mouth 2 (two) times daily.   ascorbic acid 500 MG tablet Commonly known as: VITAMIN C Take 500 mg by mouth daily.   aspirin EC 81 MG tablet Take 81 mg by mouth in the morning. Swallow whole.   atorvastatin 80 MG tablet Commonly  known as: LIPITOR Take 1 tablet (80 mg total) by mouth daily. What changed: when to take this   CALCIUM 600/VITAMIN D PO Take 1 tablet by mouth in the morning and at bedtime.   CRANBERRY EXTRACT PO Take 25,000 mg by mouth 2 (two) times daily.   diltiazem 120 MG 24 hr capsule Commonly known as: CARDIZEM CD Take 1 capsule (120 mg total) by mouth daily.   HYDROcodone-acetaminophen 7.5-325 MG tablet Commonly known as: NORCO Take 1-2 tablets by mouth every 4 (four) hours as needed for severe pain or moderate pain (1 tablet modeate pain, 2 tablets severe pain).   latanoprost 0.005 % ophthalmic solution Commonly known as: XALATAN Place 1 drop into both eyes at bedtime.   methenamine 1 g tablet Commonly known as: HIPREX Take 1 g by mouth 2 (two) times daily with a meal.   metoprolol succinate 50 MG 24 hr tablet Commonly known as: TOPROL-XL Take 25-50 mg by mouth See admin instructions. Take 50 mg by mouth at bedtime and an additional 25 mg once daily as needed for A-Fib. Take with or immediately following a meal. What changed: Another medication with the same name was removed. Continue taking this medication, and follow the directions you see here.   Multi-Vitamins Tabs Take 1 tablet by mouth daily with breakfast.   omeprazole 20 MG capsule Commonly known as: PRILOSEC Take 20 mg by mouth daily before breakfast.   polyethylene glycol 17 g packet Commonly known as: MIRALAX / GLYCOLAX Take 17 g by mouth daily.   protein supplement shake Liqd Commonly known as: PREMIER PROTEIN Take 11 oz by mouth daily.   senna-docusate 8.6-50 MG tablet Commonly known as: Senokot-S Take 1 tablet by mouth at bedtime.   Systane Preservative Free 0.4-0.3 % Soln Generic drug: Polyethyl Glyc-Propyl Glyc PF Place 1 drop into the left eye 2 (two) times daily.   timolol 0.5 % ophthalmic solution Commonly known as: TIMOPTIC Place 1 drop into the left eye 2 (two) times daily.   traZODone 50 MG  tablet Commonly known as: DESYREL Take 50 mg by mouth at bedtime as needed for sleep.   Trospium Chloride 60 MG Cp24 Take 60 mg by mouth in the morning.   Trunature Digestive Probiotic Caps Take 1 capsule by mouth daily.   Vitamin D3 50 MCG (2000 UT) capsule Take 1,000-2,000 Units by mouth daily.   VITAMIN E PO Take 1 capsule by mouth daily.         The results of significant diagnostics from  this hospitalization (including imaging, microbiology, ancillary and laboratory) are listed below for reference.    Procedures and Diagnostic Studies:   DG FEMUR PORT, MIN 2 VIEWS RIGHT  Result Date: 08/21/2022 CLINICAL DATA:  Postoperative right femur fracture. EXAM: RIGHT FEMUR PORTABLE 2 VIEW COMPARISON:  Right femur radiographs 08/20/2022, CT right knee 08/20/2022 FINDINGS: Interval long lateral plate and screw fixation of the previously seen comminuted distal femoral metadiaphyseal fracture. Improved alignment. No evidence of hardware failure. Note is made that is impaction extending distally into the intercondylar notch, better seen on prior CT. Redemonstration of proximal femoral right cephalomedullary nail fixation and right ilium, acetabulum, and superior pubic ramus plate and screw fixation. Postsurgical mild subcutaneous air is seen within the lateral right thigh. IMPRESSION: Interval ORIF of comminuted distal femoral metadiaphyseal fracture with improved alignment. No evidence of hardware failure. Electronically Signed   By: Neita Garnet M.D.   On: 08/21/2022 14:42   DG FEMUR, MIN 2 VIEWS RIGHT  Result Date: 08/21/2022 CLINICAL DATA:  ORIF of the distal right femur. EXAM: RIGHT FEMUR 2 VIEWS COMPARISON:  Right femur radiographs 08/20/2022 FLUOROSCOPY: Radiation Exposure Index (as provided by the fluoroscopic device): 3.78 mGy Kerma FINDINGS: 10 intraoperative spot fluoroscopic images are provided during ORIF of the previously described comminuted distal femur fracture. A lateral plate  and screws have been placed with improved, now nearly anatomic alignment. A pre-existing intramedullary nail is partially visualized in the more proximal femur. IMPRESSION: Intraoperative images during ORIF of a distal right femur fracture. Electronically Signed   By: Sebastian Ache M.D.   On: 08/21/2022 11:57   DG C-Arm 1-60 Min-No Report  Result Date: 08/21/2022 Fluoroscopy was utilized by the requesting physician.  No radiographic interpretation.   DG Femur Min 2 Views Right  Result Date: 08/20/2022 CLINICAL DATA:  Fall, distal femur fracture. EXAM: RIGHT FEMUR 2 VIEWS COMPARISON:  CT scan 08/20/2022 FINDINGS: OTA 33 C2 fracture of the distal femur noted with comminuted transverse component with apex anterior angulation between the dominant fragments, and a longitudinal component extending to the intercondylar notch and femoral trochlear groove. The dominant shaft fragment is anteriorly displaced with respect to the intermediary and distal fragments by about 2.4 cm. The patient has a proximal femoral nail system in place and acetabular mending plate and screw fixation extending to the superior pubis compatible with prior acetabular ORIF. Degenerative loss of articular space in the right hip. IMPRESSION: 1. OTA 33 C2 fracture of the distal femur with apex anterior angulation, displacement, and extension to the intercondylar notch and femoral trochlear groove. 2. Prior ORIF of the right acetabulum. Right proximal femoral nail system noted. Electronically Signed   By: Gaylyn Rong M.D.   On: 08/20/2022 18:08   CT Knee Right Wo Contrast  Result Date: 08/20/2022 CLINICAL DATA:  Knee trauma, tibial plateau fracture (Age >= 5y) R knee injury EXAM: CT OF THE RIGHT KNEE WITHOUT CONTRAST TECHNIQUE: Multidetector CT imaging of the right knee was performed according to the standard protocol. Multiplanar CT image reconstructions were also generated. RADIATION DOSE REDUCTION: This exam was performed according  to the departmental dose-optimization program which includes automated exposure control, adjustment of the mA and/or kV according to patient size and/or use of iterative reconstruction technique. COMPARISON:  None Available. FINDINGS: Bones/Joint/Cartilage There is a comminuted displaced fracture of the distal femoral metadiaphysis, with posterior angulation. There is an oblique transverse component and longitudinal split extending to the knee joint through the intercondylar notch. There is up to 2.0  cm posterior displacement of the medial distal fracture segment and 0.8 cm lateral displacement of the lateral distal fracture segment. There is a large lipohemarthrosis of the knee. There is no evidence of a proximal tibial fracture or proximal fibular fracture. There is mild tricompartment osteoarthritis of the knee. Ligaments Suboptimally assessed by CT. Muscles and Tendons No acute myotendinous abnormality by CT. Soft tissues There is a small hematoma along the posterior aspect of the fracture measuring approximately 3.2 x 2.6 x 2.9 cm (series 4, image 56, series 9, image 55). IMPRESSION: Comminuted and displaced fracture of the distal femoral metadiaphysis with posterior angulation and longitudinal split component extending to the knee joint through the intercondylar notch. Large lipohemarthrosis. Adjacent small hematoma posteriorly. Electronically Signed   By: Maurine Simmering M.D.   On: 08/20/2022 16:55     Labs:   Basic Metabolic Panel: Recent Labs  Lab 08/20/22 1602 08/21/22 0322 08/22/22 0140  NA 129* 130* 135  K 4.2 3.8 4.6  CL 92* 91* 98  CO2 25 26 27   GLUCOSE 137* 151* 134*  BUN 15 11 24*  CREATININE 0.66 0.81 0.66  CALCIUM 8.8* 8.9 8.1*   GFR Estimated Creatinine Clearance: 47.7 mL/min (by C-G formula based on SCr of 0.66 mg/dL). Liver Function Tests: Recent Labs  Lab 08/21/22 0322  AST 25  ALT 18  ALKPHOS 64  BILITOT 0.6  PROT 6.5  ALBUMIN 3.0*   No results for input(s):  "LIPASE", "AMYLASE" in the last 168 hours. No results for input(s): "AMMONIA" in the last 168 hours. Coagulation profile Recent Labs  Lab 08/20/22 1602  INR 1.6*    CBC: Recent Labs  Lab 08/20/22 1602 08/21/22 0322 08/22/22 0140 08/23/22 0052  WBC 10.9* 14.6* 7.1 11.1*  NEUTROABS 8.2*  --   --   --   HGB 9.5* 8.7* 8.0* 8.3*  HCT 28.1* 26.9* 22.0* 24.5*  MCV 87.8 90.3 96.1 88.1  PLT 284 379 137* 230   Cardiac Enzymes: No results for input(s): "CKTOTAL", "CKMB", "CKMBINDEX", "TROPONINI" in the last 168 hours. BNP: Invalid input(s): "POCBNP" CBG: No results for input(s): "GLUCAP" in the last 168 hours. D-Dimer No results for input(s): "DDIMER" in the last 72 hours. Hgb A1c No results for input(s): "HGBA1C" in the last 72 hours. Lipid Profile No results for input(s): "CHOL", "HDL", "LDLCALC", "TRIG", "CHOLHDL", "LDLDIRECT" in the last 72 hours. Thyroid function studies No results for input(s): "TSH", "T4TOTAL", "T3FREE", "THYROIDAB" in the last 72 hours.  Invalid input(s): "FREET3" Anemia work up No results for input(s): "VITAMINB12", "FOLATE", "FERRITIN", "TIBC", "IRON", "RETICCTPCT" in the last 72 hours. Microbiology Recent Results (from the past 240 hour(s))  Surgical PCR screen     Status: None   Collection Time: 08/21/22  6:24 AM   Specimen: Nasal Mucosa; Nasal Swab  Result Value Ref Range Status   MRSA, PCR NEGATIVE NEGATIVE Final   Staphylococcus aureus NEGATIVE NEGATIVE Final    Comment: (NOTE) The Xpert SA Assay (FDA approved for NASAL specimens in patients 60 years of age and older), is one component of a comprehensive surveillance program. It is not intended to diagnose infection nor to guide or monitor treatment. Performed at [facility], [place] [address], [place], [place] [address]     Time coordinating discharge: 35 minutes  Signed: Stefana Lodico  [facility] 08/25/2022, 2:37 PM

## 2022-08-25 NOTE — Progress Notes (Signed)
Physical Therapy Treatment Patient Details Name: Carrie Barnes MRN: [id number] DOB: 17-May-1936 Today's Date: 08/25/2022   History of Present Illness Pt is a 87 y/o female presenting on 1/10 after mechanical fall. Imaging with R intra-articular distal femoral fracture. S/P ORIF 1/11. PMH: CAD last cardiac stenting in 2018, GERD, HLD, HTN, ORIF R acetabular fx 05/2022, R reverse shoulder arthroplasty 05/2022.    PT Comments    Patient progressing well towards PT goals. Session focused on functional transfers, standing and therex. Pt tolerated wearing zero degree knee all night. Swelling and warmth noted today so donned ice post session. Requires Mod A of 2 to stand from EOB while maintaining TDWB precautions through RLE. Able to scoot LLE to pivot to get to chair with Mod A of 2 but fatigues quickly and placing some weight through RLE. Tolerated there ex and knee flexion stretching. Continues to be appropriate for SNF. Will follow.    Recommendations for follow up therapy are one component of a multi-disciplinary discharge planning process, led by the attending physician.  Recommendations may be updated based on patient status, additional functional criteria and insurance authorization.  Follow Up Recommendations  Skilled nursing-short term rehab (<3 hours/day) Can patient physically be transported by private vehicle: No   Assistance Recommended at Discharge Frequent or constant Supervision/Assistance  Patient can return home with the following Two people to help with walking and/or transfers;A lot of help with bathing/dressing/bathroom;Assistance with cooking/housework;Assist for transportation   Equipment Recommendations  None recommended by PT    Recommendations for Other Services       Precautions / Restrictions Precautions Precautions: Fall Restrictions Weight Bearing Restrictions: Yes RLE Weight Bearing: Touchdown weight bearing     Mobility  Bed Mobility Overal bed  mobility: Needs Assistance Bed Mobility: Supine to Sit     Supine to sit: Mod assist, HOB elevated     General bed mobility comments: Assist with RLE to get to EOB, scooting bottom and cues to reach for rail. Able to raise trunk with cues. Increased time.    Transfers Overall transfer level: Needs assistance Equipment used: Rolling walker (2 wheels) Transfers: Sit to/from Stand, Bed to chair/wheelchair/BSC Sit to Stand: Mod assist, +2 physical assistance, +2 safety/equipment Stand pivot transfers: Mod assist, +2 physical assistance, +2 safety/equipment         General transfer comment: Stood from EOB x2 with cues for hand placement/technique with therapist placing RLE under therapist foot to ensure TDWB. Able to scoot right foot with assist for balance, RW management and to maintain TDWB on RLE, fatigues. Heavy reliance on UEs. Placing some weight through RLE.    Ambulation/Gait             Pre-gait activities: ABle to take 1 scoot/hop with Mod A and use of RW. General Gait Details: unable   Stairs             Wheelchair Mobility    Modified Rankin (Stroke Patients Only)       Balance Overall balance assessment: Needs assistance Sitting-balance support: Feet supported, Single extremity supported Sitting balance-Leahy Scale: Fair Sitting balance - Comments: supervision for safety.   Standing balance support: During functional activity, Bilateral upper extremity supported, Reliant on assistive device for balance Standing balance-Leahy Scale: Poor Standing balance comment: relies on UE and external support, able to maintain TDWB RLE in standing but difficulty to maintain during transfer  Cognition Arousal/Alertness: Awake/alert Behavior During Therapy: WFL for tasks assessed/performed Overall Cognitive Status: Within Functional Limits for tasks assessed                                 General Comments:  very pleasant and motivated        Exercises General Exercises - Lower Extremity Ankle Circles/Pumps: AROM, Both, 10 reps, Supine Quad Sets: AROM, Both, 10 reps, Supine Long Arc Quad: AROM, Right, 10 reps, Seated Other Exercises Other Exercises: Knee flexion AROM with LEs off ground in elevated bed stretches x5 20 sec hold    General Comments General comments (skin integrity, edema, etc.): VSS on RA.      Pertinent Vitals/Pain Pain Assessment Pain Assessment: Faces Faces Pain Scale: Hurts even more Pain Location: R LE with movement Pain Descriptors / Indicators: Discomfort, Grimacing, Guarding Pain Intervention(s): Monitored during session, Premedicated before session, Limited activity within patient's tolerance, Repositioned, Ice applied    Home Living                          Prior Function            PT Goals (current goals can now be found in the care plan section) Progress towards PT goals: Progressing toward goals    Frequency    Min 3X/week      PT Plan Current plan remains appropriate    Co-evaluation              AM-PAC PT "6 Clicks" Mobility   Outcome Measure  Help needed turning from your back to your side while in a flat bed without using bedrails?: A Lot Help needed moving from lying on your back to sitting on the side of a flat bed without using bedrails?: A Lot Help needed moving to and from a bed to a chair (including a wheelchair)?: Total Help needed standing up from a chair using your arms (e.g., wheelchair or bedside chair)?: Total Help needed to walk in hospital room?: Total Help needed climbing 3-5 steps with a railing? : Total 6 Click Score: 8    End of Session Equipment Utilized During Treatment: Gait belt Activity Tolerance: Patient tolerated treatment well Patient left: in chair;with call bell/phone within reach;with chair alarm set Nurse Communication: Mobility status PT Visit Diagnosis: Unsteadiness on feet  (R26.81);Other abnormalities of gait and mobility (R26.89);Muscle weakness (generalized) (M62.81);History of falling (Z91.81);Difficulty in walking, not elsewhere classified (R26.2);Pain Pain - Right/Left: Right Pain - part of body: Knee;Leg     Time: 5027-7412 PT Time Calculation (min) (ACUTE ONLY): 26 min  Charges:  $Therapeutic Exercise: 8-22 mins $Therapeutic Activity: 8-22 mins                     Marisa Severin, PT, DPT Acute Rehabilitation Services Secure chat preferred Office [place] 08/25/2022, 9:05 AM

## 2022-08-26 DIAGNOSIS — I480 Paroxysmal atrial fibrillation: Secondary | ICD-10-CM | POA: Diagnosis not present

## 2022-08-26 DIAGNOSIS — S72401D Unspecified fracture of lower end of right femur, subsequent encounter for closed fracture with routine healing: Secondary | ICD-10-CM | POA: Diagnosis not present

## 2022-08-26 DIAGNOSIS — E785 Hyperlipidemia, unspecified: Secondary | ICD-10-CM | POA: Diagnosis not present

## 2022-08-26 DIAGNOSIS — I2510 Atherosclerotic heart disease of native coronary artery without angina pectoris: Secondary | ICD-10-CM | POA: Diagnosis not present

## 2022-09-03 DIAGNOSIS — Z9181 History of falling: Secondary | ICD-10-CM | POA: Diagnosis not present

## 2022-09-03 DIAGNOSIS — M1990 Unspecified osteoarthritis, unspecified site: Secondary | ICD-10-CM | POA: Diagnosis not present

## 2022-09-03 DIAGNOSIS — I10 Essential (primary) hypertension: Secondary | ICD-10-CM | POA: Diagnosis not present

## 2022-09-03 DIAGNOSIS — R5381 Other malaise: Secondary | ICD-10-CM | POA: Diagnosis not present

## 2022-09-03 DIAGNOSIS — M6281 Muscle weakness (generalized): Secondary | ICD-10-CM | POA: Diagnosis not present

## 2022-09-03 DIAGNOSIS — R2681 Unsteadiness on feet: Secondary | ICD-10-CM | POA: Diagnosis not present

## 2022-09-08 DIAGNOSIS — R399 Unspecified symptoms and signs involving the genitourinary system: Secondary | ICD-10-CM | POA: Diagnosis not present

## 2022-09-08 DIAGNOSIS — D649 Anemia, unspecified: Secondary | ICD-10-CM | POA: Diagnosis not present

## 2022-09-08 DIAGNOSIS — M6281 Muscle weakness (generalized): Secondary | ICD-10-CM | POA: Diagnosis not present

## 2022-09-08 DIAGNOSIS — R2681 Unsteadiness on feet: Secondary | ICD-10-CM | POA: Diagnosis not present

## 2022-09-08 DIAGNOSIS — R5381 Other malaise: Secondary | ICD-10-CM | POA: Diagnosis not present

## 2022-09-08 DIAGNOSIS — Z9181 History of falling: Secondary | ICD-10-CM | POA: Diagnosis not present

## 2022-09-08 DIAGNOSIS — I10 Essential (primary) hypertension: Secondary | ICD-10-CM | POA: Diagnosis not present

## 2022-09-08 DIAGNOSIS — E871 Hypo-osmolality and hyponatremia: Secondary | ICD-10-CM | POA: Diagnosis not present

## 2022-09-09 DIAGNOSIS — S72401D Unspecified fracture of lower end of right femur, subsequent encounter for closed fracture with routine healing: Secondary | ICD-10-CM | POA: Diagnosis not present

## 2022-09-10 DIAGNOSIS — Z9181 History of falling: Secondary | ICD-10-CM | POA: Diagnosis not present

## 2022-09-10 DIAGNOSIS — R2681 Unsteadiness on feet: Secondary | ICD-10-CM | POA: Diagnosis not present

## 2022-09-10 DIAGNOSIS — M6281 Muscle weakness (generalized): Secondary | ICD-10-CM | POA: Diagnosis not present

## 2022-09-10 DIAGNOSIS — R5381 Other malaise: Secondary | ICD-10-CM | POA: Diagnosis not present

## 2022-09-11 DIAGNOSIS — N390 Urinary tract infection, site not specified: Secondary | ICD-10-CM | POA: Diagnosis not present

## 2022-09-15 DIAGNOSIS — R5381 Other malaise: Secondary | ICD-10-CM | POA: Diagnosis not present

## 2022-09-15 DIAGNOSIS — Z9181 History of falling: Secondary | ICD-10-CM | POA: Diagnosis not present

## 2022-09-15 DIAGNOSIS — R2681 Unsteadiness on feet: Secondary | ICD-10-CM | POA: Diagnosis not present

## 2022-09-15 DIAGNOSIS — M6281 Muscle weakness (generalized): Secondary | ICD-10-CM | POA: Diagnosis not present

## 2022-09-17 DIAGNOSIS — S72401D Unspecified fracture of lower end of right femur, subsequent encounter for closed fracture with routine healing: Secondary | ICD-10-CM | POA: Diagnosis not present

## 2022-09-17 DIAGNOSIS — Z9181 History of falling: Secondary | ICD-10-CM | POA: Diagnosis not present

## 2022-09-17 DIAGNOSIS — R5381 Other malaise: Secondary | ICD-10-CM | POA: Diagnosis not present

## 2022-09-17 DIAGNOSIS — R2681 Unsteadiness on feet: Secondary | ICD-10-CM | POA: Diagnosis not present

## 2022-09-22 DIAGNOSIS — R2681 Unsteadiness on feet: Secondary | ICD-10-CM | POA: Diagnosis not present

## 2022-09-22 DIAGNOSIS — Z9181 History of falling: Secondary | ICD-10-CM | POA: Diagnosis not present

## 2022-09-22 DIAGNOSIS — S72401D Unspecified fracture of lower end of right femur, subsequent encounter for closed fracture with routine healing: Secondary | ICD-10-CM | POA: Diagnosis not present

## 2022-09-22 DIAGNOSIS — R5381 Other malaise: Secondary | ICD-10-CM | POA: Diagnosis not present

## 2022-09-24 DIAGNOSIS — R5381 Other malaise: Secondary | ICD-10-CM | POA: Diagnosis not present

## 2022-09-24 DIAGNOSIS — Z9181 History of falling: Secondary | ICD-10-CM | POA: Diagnosis not present

## 2022-09-24 DIAGNOSIS — R2681 Unsteadiness on feet: Secondary | ICD-10-CM | POA: Diagnosis not present

## 2022-09-24 DIAGNOSIS — S72401D Unspecified fracture of lower end of right femur, subsequent encounter for closed fracture with routine healing: Secondary | ICD-10-CM | POA: Diagnosis not present

## 2022-09-25 DIAGNOSIS — G479 Sleep disorder, unspecified: Secondary | ICD-10-CM | POA: Diagnosis not present

## 2022-09-25 DIAGNOSIS — M1990 Unspecified osteoarthritis, unspecified site: Secondary | ICD-10-CM | POA: Diagnosis not present

## 2022-09-25 DIAGNOSIS — S72001D Fracture of unspecified part of neck of right femur, subsequent encounter for closed fracture with routine healing: Secondary | ICD-10-CM | POA: Diagnosis not present

## 2022-09-25 DIAGNOSIS — Z189 Retained foreign body fragments, unspecified material: Secondary | ICD-10-CM | POA: Diagnosis not present

## 2022-09-25 DIAGNOSIS — I10 Essential (primary) hypertension: Secondary | ICD-10-CM | POA: Diagnosis not present

## 2022-09-25 DIAGNOSIS — T8189XA Other complications of procedures, not elsewhere classified, initial encounter: Secondary | ICD-10-CM | POA: Diagnosis not present

## 2022-09-29 DIAGNOSIS — S72401D Unspecified fracture of lower end of right femur, subsequent encounter for closed fracture with routine healing: Secondary | ICD-10-CM | POA: Diagnosis not present

## 2022-09-29 DIAGNOSIS — Z9181 History of falling: Secondary | ICD-10-CM | POA: Diagnosis not present

## 2022-09-29 DIAGNOSIS — R5381 Other malaise: Secondary | ICD-10-CM | POA: Diagnosis not present

## 2022-09-29 DIAGNOSIS — R2681 Unsteadiness on feet: Secondary | ICD-10-CM | POA: Diagnosis not present

## 2022-09-30 DIAGNOSIS — S72401D Unspecified fracture of lower end of right femur, subsequent encounter for closed fracture with routine healing: Secondary | ICD-10-CM | POA: Diagnosis not present

## 2022-09-30 DIAGNOSIS — S32401D Unspecified fracture of right acetabulum, subsequent encounter for fracture with routine healing: Secondary | ICD-10-CM | POA: Diagnosis not present

## 2022-10-01 DIAGNOSIS — R5381 Other malaise: Secondary | ICD-10-CM | POA: Diagnosis not present

## 2022-10-01 DIAGNOSIS — H04123 Dry eye syndrome of bilateral lacrimal glands: Secondary | ICD-10-CM | POA: Diagnosis not present

## 2022-10-01 DIAGNOSIS — H401122 Primary open-angle glaucoma, left eye, moderate stage: Secondary | ICD-10-CM | POA: Diagnosis not present

## 2022-10-01 DIAGNOSIS — Z9181 History of falling: Secondary | ICD-10-CM | POA: Diagnosis not present

## 2022-10-01 DIAGNOSIS — R2681 Unsteadiness on feet: Secondary | ICD-10-CM | POA: Diagnosis not present

## 2022-10-01 DIAGNOSIS — S72401D Unspecified fracture of lower end of right femur, subsequent encounter for closed fracture with routine healing: Secondary | ICD-10-CM | POA: Diagnosis not present

## 2022-10-03 DIAGNOSIS — R339 Retention of urine, unspecified: Secondary | ICD-10-CM | POA: Diagnosis not present

## 2022-10-03 DIAGNOSIS — R399 Unspecified symptoms and signs involving the genitourinary system: Secondary | ICD-10-CM | POA: Diagnosis not present

## 2022-10-03 DIAGNOSIS — N390 Urinary tract infection, site not specified: Secondary | ICD-10-CM | POA: Diagnosis not present

## 2022-10-06 DIAGNOSIS — R2681 Unsteadiness on feet: Secondary | ICD-10-CM | POA: Diagnosis not present

## 2022-10-06 DIAGNOSIS — Z9181 History of falling: Secondary | ICD-10-CM | POA: Diagnosis not present

## 2022-10-06 DIAGNOSIS — R5381 Other malaise: Secondary | ICD-10-CM | POA: Diagnosis not present

## 2022-10-06 DIAGNOSIS — S72401D Unspecified fracture of lower end of right femur, subsequent encounter for closed fracture with routine healing: Secondary | ICD-10-CM | POA: Diagnosis not present

## 2022-10-08 DIAGNOSIS — R5381 Other malaise: Secondary | ICD-10-CM | POA: Diagnosis not present

## 2022-10-08 DIAGNOSIS — S72401D Unspecified fracture of lower end of right femur, subsequent encounter for closed fracture with routine healing: Secondary | ICD-10-CM | POA: Diagnosis not present

## 2022-10-08 DIAGNOSIS — Z9181 History of falling: Secondary | ICD-10-CM | POA: Diagnosis not present

## 2022-10-08 DIAGNOSIS — R2681 Unsteadiness on feet: Secondary | ICD-10-CM | POA: Diagnosis not present

## 2022-10-09 DIAGNOSIS — I480 Paroxysmal atrial fibrillation: Secondary | ICD-10-CM | POA: Diagnosis not present

## 2022-10-09 DIAGNOSIS — I5022 Chronic systolic (congestive) heart failure: Secondary | ICD-10-CM | POA: Diagnosis not present

## 2022-10-09 DIAGNOSIS — R339 Retention of urine, unspecified: Secondary | ICD-10-CM | POA: Diagnosis not present

## 2022-10-09 DIAGNOSIS — S72401D Unspecified fracture of lower end of right femur, subsequent encounter for closed fracture with routine healing: Secondary | ICD-10-CM | POA: Diagnosis not present

## 2022-10-15 DIAGNOSIS — E871 Hypo-osmolality and hyponatremia: Secondary | ICD-10-CM | POA: Diagnosis not present

## 2022-10-15 DIAGNOSIS — Z7901 Long term (current) use of anticoagulants: Secondary | ICD-10-CM | POA: Diagnosis not present

## 2022-10-15 DIAGNOSIS — I110 Hypertensive heart disease with heart failure: Secondary | ICD-10-CM | POA: Diagnosis not present

## 2022-10-15 DIAGNOSIS — E785 Hyperlipidemia, unspecified: Secondary | ICD-10-CM | POA: Diagnosis not present

## 2022-10-15 DIAGNOSIS — M1990 Unspecified osteoarthritis, unspecified site: Secondary | ICD-10-CM | POA: Diagnosis not present

## 2022-10-15 DIAGNOSIS — I252 Old myocardial infarction: Secondary | ICD-10-CM | POA: Diagnosis not present

## 2022-10-15 DIAGNOSIS — Z96611 Presence of right artificial shoulder joint: Secondary | ICD-10-CM | POA: Diagnosis not present

## 2022-10-15 DIAGNOSIS — D649 Anemia, unspecified: Secondary | ICD-10-CM | POA: Diagnosis not present

## 2022-10-15 DIAGNOSIS — I5022 Chronic systolic (congestive) heart failure: Secondary | ICD-10-CM | POA: Diagnosis not present

## 2022-10-15 DIAGNOSIS — Z9181 History of falling: Secondary | ICD-10-CM | POA: Diagnosis not present

## 2022-10-15 DIAGNOSIS — M80051D Age-related osteoporosis with current pathological fracture, right femur, subsequent encounter for fracture with routine healing: Secondary | ICD-10-CM | POA: Diagnosis not present

## 2022-10-15 DIAGNOSIS — Z87440 Personal history of urinary (tract) infections: Secondary | ICD-10-CM | POA: Diagnosis not present

## 2022-10-15 DIAGNOSIS — I2510 Atherosclerotic heart disease of native coronary artery without angina pectoris: Secondary | ICD-10-CM | POA: Diagnosis not present

## 2022-10-15 DIAGNOSIS — K219 Gastro-esophageal reflux disease without esophagitis: Secondary | ICD-10-CM | POA: Diagnosis not present

## 2022-10-15 DIAGNOSIS — K5901 Slow transit constipation: Secondary | ICD-10-CM | POA: Diagnosis not present

## 2022-10-15 DIAGNOSIS — Z7982 Long term (current) use of aspirin: Secondary | ICD-10-CM | POA: Diagnosis not present

## 2022-10-15 DIAGNOSIS — I429 Cardiomyopathy, unspecified: Secondary | ICD-10-CM | POA: Diagnosis not present

## 2022-10-15 DIAGNOSIS — I480 Paroxysmal atrial fibrillation: Secondary | ICD-10-CM | POA: Diagnosis not present

## 2022-10-15 DIAGNOSIS — I255 Ischemic cardiomyopathy: Secondary | ICD-10-CM | POA: Diagnosis not present

## 2022-10-21 DIAGNOSIS — Z1331 Encounter for screening for depression: Secondary | ICD-10-CM | POA: Diagnosis not present

## 2022-10-21 DIAGNOSIS — I700 Atherosclerosis of aorta: Secondary | ICD-10-CM | POA: Diagnosis not present

## 2022-10-21 DIAGNOSIS — I10 Essential (primary) hypertension: Secondary | ICD-10-CM | POA: Diagnosis not present

## 2022-10-21 DIAGNOSIS — Z0000 Encounter for general adult medical examination without abnormal findings: Secondary | ICD-10-CM | POA: Diagnosis not present

## 2022-10-23 DIAGNOSIS — I429 Cardiomyopathy, unspecified: Secondary | ICD-10-CM | POA: Diagnosis not present

## 2022-10-23 DIAGNOSIS — Z7901 Long term (current) use of anticoagulants: Secondary | ICD-10-CM | POA: Diagnosis not present

## 2022-10-23 DIAGNOSIS — I110 Hypertensive heart disease with heart failure: Secondary | ICD-10-CM | POA: Diagnosis not present

## 2022-10-23 DIAGNOSIS — Z7982 Long term (current) use of aspirin: Secondary | ICD-10-CM | POA: Diagnosis not present

## 2022-10-23 DIAGNOSIS — M1990 Unspecified osteoarthritis, unspecified site: Secondary | ICD-10-CM | POA: Diagnosis not present

## 2022-10-23 DIAGNOSIS — I2510 Atherosclerotic heart disease of native coronary artery without angina pectoris: Secondary | ICD-10-CM | POA: Diagnosis not present

## 2022-10-23 DIAGNOSIS — I252 Old myocardial infarction: Secondary | ICD-10-CM | POA: Diagnosis not present

## 2022-10-23 DIAGNOSIS — E871 Hypo-osmolality and hyponatremia: Secondary | ICD-10-CM | POA: Diagnosis not present

## 2022-10-23 DIAGNOSIS — I255 Ischemic cardiomyopathy: Secondary | ICD-10-CM | POA: Diagnosis not present

## 2022-10-23 DIAGNOSIS — K219 Gastro-esophageal reflux disease without esophagitis: Secondary | ICD-10-CM | POA: Diagnosis not present

## 2022-10-23 DIAGNOSIS — I480 Paroxysmal atrial fibrillation: Secondary | ICD-10-CM | POA: Diagnosis not present

## 2022-10-23 DIAGNOSIS — I5022 Chronic systolic (congestive) heart failure: Secondary | ICD-10-CM | POA: Diagnosis not present

## 2022-10-23 DIAGNOSIS — D649 Anemia, unspecified: Secondary | ICD-10-CM | POA: Diagnosis not present

## 2022-10-23 DIAGNOSIS — M80051D Age-related osteoporosis with current pathological fracture, right femur, subsequent encounter for fracture with routine healing: Secondary | ICD-10-CM | POA: Diagnosis not present

## 2022-10-23 DIAGNOSIS — K5901 Slow transit constipation: Secondary | ICD-10-CM | POA: Diagnosis not present

## 2022-10-23 DIAGNOSIS — E785 Hyperlipidemia, unspecified: Secondary | ICD-10-CM | POA: Diagnosis not present

## 2022-10-24 DIAGNOSIS — I255 Ischemic cardiomyopathy: Secondary | ICD-10-CM | POA: Diagnosis not present

## 2022-10-24 DIAGNOSIS — I252 Old myocardial infarction: Secondary | ICD-10-CM | POA: Diagnosis not present

## 2022-10-24 DIAGNOSIS — I110 Hypertensive heart disease with heart failure: Secondary | ICD-10-CM | POA: Diagnosis not present

## 2022-10-24 DIAGNOSIS — I480 Paroxysmal atrial fibrillation: Secondary | ICD-10-CM | POA: Diagnosis not present

## 2022-10-24 DIAGNOSIS — I2510 Atherosclerotic heart disease of native coronary artery without angina pectoris: Secondary | ICD-10-CM | POA: Diagnosis not present

## 2022-10-24 DIAGNOSIS — K219 Gastro-esophageal reflux disease without esophagitis: Secondary | ICD-10-CM | POA: Diagnosis not present

## 2022-10-24 DIAGNOSIS — K5901 Slow transit constipation: Secondary | ICD-10-CM | POA: Diagnosis not present

## 2022-10-24 DIAGNOSIS — M1990 Unspecified osteoarthritis, unspecified site: Secondary | ICD-10-CM | POA: Diagnosis not present

## 2022-10-24 DIAGNOSIS — I429 Cardiomyopathy, unspecified: Secondary | ICD-10-CM | POA: Diagnosis not present

## 2022-10-24 DIAGNOSIS — Z7901 Long term (current) use of anticoagulants: Secondary | ICD-10-CM | POA: Diagnosis not present

## 2022-10-24 DIAGNOSIS — Z7982 Long term (current) use of aspirin: Secondary | ICD-10-CM | POA: Diagnosis not present

## 2022-10-24 DIAGNOSIS — M80051D Age-related osteoporosis with current pathological fracture, right femur, subsequent encounter for fracture with routine healing: Secondary | ICD-10-CM | POA: Diagnosis not present

## 2022-10-24 DIAGNOSIS — E785 Hyperlipidemia, unspecified: Secondary | ICD-10-CM | POA: Diagnosis not present

## 2022-10-24 DIAGNOSIS — E871 Hypo-osmolality and hyponatremia: Secondary | ICD-10-CM | POA: Diagnosis not present

## 2022-10-24 DIAGNOSIS — D649 Anemia, unspecified: Secondary | ICD-10-CM | POA: Diagnosis not present

## 2022-10-24 DIAGNOSIS — I5022 Chronic systolic (congestive) heart failure: Secondary | ICD-10-CM | POA: Diagnosis not present

## 2022-10-27 DIAGNOSIS — D649 Anemia, unspecified: Secondary | ICD-10-CM | POA: Diagnosis not present

## 2022-10-27 DIAGNOSIS — Z7982 Long term (current) use of aspirin: Secondary | ICD-10-CM | POA: Diagnosis not present

## 2022-10-27 DIAGNOSIS — E871 Hypo-osmolality and hyponatremia: Secondary | ICD-10-CM | POA: Diagnosis not present

## 2022-10-27 DIAGNOSIS — K219 Gastro-esophageal reflux disease without esophagitis: Secondary | ICD-10-CM | POA: Diagnosis not present

## 2022-10-27 DIAGNOSIS — I2510 Atherosclerotic heart disease of native coronary artery without angina pectoris: Secondary | ICD-10-CM | POA: Diagnosis not present

## 2022-10-27 DIAGNOSIS — K5901 Slow transit constipation: Secondary | ICD-10-CM | POA: Diagnosis not present

## 2022-10-27 DIAGNOSIS — I255 Ischemic cardiomyopathy: Secondary | ICD-10-CM | POA: Diagnosis not present

## 2022-10-27 DIAGNOSIS — M1990 Unspecified osteoarthritis, unspecified site: Secondary | ICD-10-CM | POA: Diagnosis not present

## 2022-10-27 DIAGNOSIS — I252 Old myocardial infarction: Secondary | ICD-10-CM | POA: Diagnosis not present

## 2022-10-27 DIAGNOSIS — S72401D Unspecified fracture of lower end of right femur, subsequent encounter for closed fracture with routine healing: Secondary | ICD-10-CM | POA: Diagnosis not present

## 2022-10-27 DIAGNOSIS — M80051D Age-related osteoporosis with current pathological fracture, right femur, subsequent encounter for fracture with routine healing: Secondary | ICD-10-CM | POA: Diagnosis not present

## 2022-10-27 DIAGNOSIS — Z7901 Long term (current) use of anticoagulants: Secondary | ICD-10-CM | POA: Diagnosis not present

## 2022-10-27 DIAGNOSIS — I429 Cardiomyopathy, unspecified: Secondary | ICD-10-CM | POA: Diagnosis not present

## 2022-10-27 DIAGNOSIS — I110 Hypertensive heart disease with heart failure: Secondary | ICD-10-CM | POA: Diagnosis not present

## 2022-10-27 DIAGNOSIS — I5022 Chronic systolic (congestive) heart failure: Secondary | ICD-10-CM | POA: Diagnosis not present

## 2022-10-27 DIAGNOSIS — E785 Hyperlipidemia, unspecified: Secondary | ICD-10-CM | POA: Diagnosis not present

## 2022-10-27 DIAGNOSIS — I480 Paroxysmal atrial fibrillation: Secondary | ICD-10-CM | POA: Diagnosis not present

## 2022-10-28 DIAGNOSIS — M810 Age-related osteoporosis without current pathological fracture: Secondary | ICD-10-CM | POA: Diagnosis not present

## 2022-10-28 DIAGNOSIS — Z8781 Personal history of (healed) traumatic fracture: Secondary | ICD-10-CM | POA: Diagnosis not present

## 2022-10-28 DIAGNOSIS — M1990 Unspecified osteoarthritis, unspecified site: Secondary | ICD-10-CM | POA: Diagnosis not present

## 2022-10-28 DIAGNOSIS — I255 Ischemic cardiomyopathy: Secondary | ICD-10-CM | POA: Diagnosis not present

## 2022-10-29 DIAGNOSIS — Z7901 Long term (current) use of anticoagulants: Secondary | ICD-10-CM | POA: Diagnosis not present

## 2022-10-29 DIAGNOSIS — I110 Hypertensive heart disease with heart failure: Secondary | ICD-10-CM | POA: Diagnosis not present

## 2022-10-29 DIAGNOSIS — M1990 Unspecified osteoarthritis, unspecified site: Secondary | ICD-10-CM | POA: Diagnosis not present

## 2022-10-29 DIAGNOSIS — I2510 Atherosclerotic heart disease of native coronary artery without angina pectoris: Secondary | ICD-10-CM | POA: Diagnosis not present

## 2022-10-29 DIAGNOSIS — I5022 Chronic systolic (congestive) heart failure: Secondary | ICD-10-CM | POA: Diagnosis not present

## 2022-10-29 DIAGNOSIS — I429 Cardiomyopathy, unspecified: Secondary | ICD-10-CM | POA: Diagnosis not present

## 2022-10-29 DIAGNOSIS — Z7982 Long term (current) use of aspirin: Secondary | ICD-10-CM | POA: Diagnosis not present

## 2022-10-29 DIAGNOSIS — I252 Old myocardial infarction: Secondary | ICD-10-CM | POA: Diagnosis not present

## 2022-10-29 DIAGNOSIS — I255 Ischemic cardiomyopathy: Secondary | ICD-10-CM | POA: Diagnosis not present

## 2022-10-29 DIAGNOSIS — I480 Paroxysmal atrial fibrillation: Secondary | ICD-10-CM | POA: Diagnosis not present

## 2022-10-29 DIAGNOSIS — K5901 Slow transit constipation: Secondary | ICD-10-CM | POA: Diagnosis not present

## 2022-10-29 DIAGNOSIS — K219 Gastro-esophageal reflux disease without esophagitis: Secondary | ICD-10-CM | POA: Diagnosis not present

## 2022-10-29 DIAGNOSIS — E871 Hypo-osmolality and hyponatremia: Secondary | ICD-10-CM | POA: Diagnosis not present

## 2022-10-29 DIAGNOSIS — M80051D Age-related osteoporosis with current pathological fracture, right femur, subsequent encounter for fracture with routine healing: Secondary | ICD-10-CM | POA: Diagnosis not present

## 2022-10-29 DIAGNOSIS — E785 Hyperlipidemia, unspecified: Secondary | ICD-10-CM | POA: Diagnosis not present

## 2022-10-29 DIAGNOSIS — D649 Anemia, unspecified: Secondary | ICD-10-CM | POA: Diagnosis not present

## 2022-11-03 DIAGNOSIS — M1990 Unspecified osteoarthritis, unspecified site: Secondary | ICD-10-CM | POA: Diagnosis not present

## 2022-11-03 DIAGNOSIS — Z7982 Long term (current) use of aspirin: Secondary | ICD-10-CM | POA: Diagnosis not present

## 2022-11-03 DIAGNOSIS — I5022 Chronic systolic (congestive) heart failure: Secondary | ICD-10-CM | POA: Diagnosis not present

## 2022-11-03 DIAGNOSIS — I480 Paroxysmal atrial fibrillation: Secondary | ICD-10-CM | POA: Diagnosis not present

## 2022-11-03 DIAGNOSIS — M80051D Age-related osteoporosis with current pathological fracture, right femur, subsequent encounter for fracture with routine healing: Secondary | ICD-10-CM | POA: Diagnosis not present

## 2022-11-03 DIAGNOSIS — K219 Gastro-esophageal reflux disease without esophagitis: Secondary | ICD-10-CM | POA: Diagnosis not present

## 2022-11-03 DIAGNOSIS — E871 Hypo-osmolality and hyponatremia: Secondary | ICD-10-CM | POA: Diagnosis not present

## 2022-11-03 DIAGNOSIS — I255 Ischemic cardiomyopathy: Secondary | ICD-10-CM | POA: Diagnosis not present

## 2022-11-03 DIAGNOSIS — I252 Old myocardial infarction: Secondary | ICD-10-CM | POA: Diagnosis not present

## 2022-11-03 DIAGNOSIS — Z7901 Long term (current) use of anticoagulants: Secondary | ICD-10-CM | POA: Diagnosis not present

## 2022-11-03 DIAGNOSIS — I2510 Atherosclerotic heart disease of native coronary artery without angina pectoris: Secondary | ICD-10-CM | POA: Diagnosis not present

## 2022-11-03 DIAGNOSIS — I429 Cardiomyopathy, unspecified: Secondary | ICD-10-CM | POA: Diagnosis not present

## 2022-11-03 DIAGNOSIS — I110 Hypertensive heart disease with heart failure: Secondary | ICD-10-CM | POA: Diagnosis not present

## 2022-11-03 DIAGNOSIS — E785 Hyperlipidemia, unspecified: Secondary | ICD-10-CM | POA: Diagnosis not present

## 2022-11-03 DIAGNOSIS — D649 Anemia, unspecified: Secondary | ICD-10-CM | POA: Diagnosis not present

## 2022-11-03 DIAGNOSIS — K5901 Slow transit constipation: Secondary | ICD-10-CM | POA: Diagnosis not present

## 2022-11-10 DIAGNOSIS — I255 Ischemic cardiomyopathy: Secondary | ICD-10-CM | POA: Diagnosis not present

## 2022-11-10 DIAGNOSIS — I5022 Chronic systolic (congestive) heart failure: Secondary | ICD-10-CM | POA: Diagnosis not present

## 2022-11-10 DIAGNOSIS — Z7901 Long term (current) use of anticoagulants: Secondary | ICD-10-CM | POA: Diagnosis not present

## 2022-11-10 DIAGNOSIS — K5901 Slow transit constipation: Secondary | ICD-10-CM | POA: Diagnosis not present

## 2022-11-10 DIAGNOSIS — K219 Gastro-esophageal reflux disease without esophagitis: Secondary | ICD-10-CM | POA: Diagnosis not present

## 2022-11-10 DIAGNOSIS — M80051D Age-related osteoporosis with current pathological fracture, right femur, subsequent encounter for fracture with routine healing: Secondary | ICD-10-CM | POA: Diagnosis not present

## 2022-11-10 DIAGNOSIS — E871 Hypo-osmolality and hyponatremia: Secondary | ICD-10-CM | POA: Diagnosis not present

## 2022-11-10 DIAGNOSIS — I429 Cardiomyopathy, unspecified: Secondary | ICD-10-CM | POA: Diagnosis not present

## 2022-11-10 DIAGNOSIS — M1990 Unspecified osteoarthritis, unspecified site: Secondary | ICD-10-CM | POA: Diagnosis not present

## 2022-11-10 DIAGNOSIS — E785 Hyperlipidemia, unspecified: Secondary | ICD-10-CM | POA: Diagnosis not present

## 2022-11-10 DIAGNOSIS — I252 Old myocardial infarction: Secondary | ICD-10-CM | POA: Diagnosis not present

## 2022-11-10 DIAGNOSIS — I110 Hypertensive heart disease with heart failure: Secondary | ICD-10-CM | POA: Diagnosis not present

## 2022-11-10 DIAGNOSIS — I480 Paroxysmal atrial fibrillation: Secondary | ICD-10-CM | POA: Diagnosis not present

## 2022-11-10 DIAGNOSIS — I2510 Atherosclerotic heart disease of native coronary artery without angina pectoris: Secondary | ICD-10-CM | POA: Diagnosis not present

## 2022-11-10 DIAGNOSIS — Z7982 Long term (current) use of aspirin: Secondary | ICD-10-CM | POA: Diagnosis not present

## 2022-11-10 DIAGNOSIS — D649 Anemia, unspecified: Secondary | ICD-10-CM | POA: Diagnosis not present

## 2022-11-12 DIAGNOSIS — E785 Hyperlipidemia, unspecified: Secondary | ICD-10-CM | POA: Diagnosis not present

## 2022-11-12 DIAGNOSIS — Z7982 Long term (current) use of aspirin: Secondary | ICD-10-CM | POA: Diagnosis not present

## 2022-11-12 DIAGNOSIS — I2510 Atherosclerotic heart disease of native coronary artery without angina pectoris: Secondary | ICD-10-CM | POA: Diagnosis not present

## 2022-11-12 DIAGNOSIS — K5901 Slow transit constipation: Secondary | ICD-10-CM | POA: Diagnosis not present

## 2022-11-12 DIAGNOSIS — Z7901 Long term (current) use of anticoagulants: Secondary | ICD-10-CM | POA: Diagnosis not present

## 2022-11-12 DIAGNOSIS — I429 Cardiomyopathy, unspecified: Secondary | ICD-10-CM | POA: Diagnosis not present

## 2022-11-12 DIAGNOSIS — M1990 Unspecified osteoarthritis, unspecified site: Secondary | ICD-10-CM | POA: Diagnosis not present

## 2022-11-12 DIAGNOSIS — I480 Paroxysmal atrial fibrillation: Secondary | ICD-10-CM | POA: Diagnosis not present

## 2022-11-12 DIAGNOSIS — D649 Anemia, unspecified: Secondary | ICD-10-CM | POA: Diagnosis not present

## 2022-11-12 DIAGNOSIS — M80051D Age-related osteoporosis with current pathological fracture, right femur, subsequent encounter for fracture with routine healing: Secondary | ICD-10-CM | POA: Diagnosis not present

## 2022-11-12 DIAGNOSIS — E871 Hypo-osmolality and hyponatremia: Secondary | ICD-10-CM | POA: Diagnosis not present

## 2022-11-12 DIAGNOSIS — K219 Gastro-esophageal reflux disease without esophagitis: Secondary | ICD-10-CM | POA: Diagnosis not present

## 2022-11-12 DIAGNOSIS — I255 Ischemic cardiomyopathy: Secondary | ICD-10-CM | POA: Diagnosis not present

## 2022-11-12 DIAGNOSIS — I110 Hypertensive heart disease with heart failure: Secondary | ICD-10-CM | POA: Diagnosis not present

## 2022-11-12 DIAGNOSIS — I5022 Chronic systolic (congestive) heart failure: Secondary | ICD-10-CM | POA: Diagnosis not present

## 2022-11-12 DIAGNOSIS — I252 Old myocardial infarction: Secondary | ICD-10-CM | POA: Diagnosis not present

## 2022-11-14 DIAGNOSIS — D649 Anemia, unspecified: Secondary | ICD-10-CM | POA: Diagnosis not present

## 2022-11-14 DIAGNOSIS — Z87440 Personal history of urinary (tract) infections: Secondary | ICD-10-CM | POA: Diagnosis not present

## 2022-11-14 DIAGNOSIS — Z9181 History of falling: Secondary | ICD-10-CM | POA: Diagnosis not present

## 2022-11-14 DIAGNOSIS — E871 Hypo-osmolality and hyponatremia: Secondary | ICD-10-CM | POA: Diagnosis not present

## 2022-11-14 DIAGNOSIS — I252 Old myocardial infarction: Secondary | ICD-10-CM | POA: Diagnosis not present

## 2022-11-14 DIAGNOSIS — I480 Paroxysmal atrial fibrillation: Secondary | ICD-10-CM | POA: Diagnosis not present

## 2022-11-14 DIAGNOSIS — Z7982 Long term (current) use of aspirin: Secondary | ICD-10-CM | POA: Diagnosis not present

## 2022-11-14 DIAGNOSIS — M80051D Age-related osteoporosis with current pathological fracture, right femur, subsequent encounter for fracture with routine healing: Secondary | ICD-10-CM | POA: Diagnosis not present

## 2022-11-14 DIAGNOSIS — K5901 Slow transit constipation: Secondary | ICD-10-CM | POA: Diagnosis not present

## 2022-11-14 DIAGNOSIS — I429 Cardiomyopathy, unspecified: Secondary | ICD-10-CM | POA: Diagnosis not present

## 2022-11-14 DIAGNOSIS — I2510 Atherosclerotic heart disease of native coronary artery without angina pectoris: Secondary | ICD-10-CM | POA: Diagnosis not present

## 2022-11-14 DIAGNOSIS — Z96611 Presence of right artificial shoulder joint: Secondary | ICD-10-CM | POA: Diagnosis not present

## 2022-11-14 DIAGNOSIS — E785 Hyperlipidemia, unspecified: Secondary | ICD-10-CM | POA: Diagnosis not present

## 2022-11-14 DIAGNOSIS — I110 Hypertensive heart disease with heart failure: Secondary | ICD-10-CM | POA: Diagnosis not present

## 2022-11-14 DIAGNOSIS — M1990 Unspecified osteoarthritis, unspecified site: Secondary | ICD-10-CM | POA: Diagnosis not present

## 2022-11-14 DIAGNOSIS — I5022 Chronic systolic (congestive) heart failure: Secondary | ICD-10-CM | POA: Diagnosis not present

## 2022-11-14 DIAGNOSIS — Z7901 Long term (current) use of anticoagulants: Secondary | ICD-10-CM | POA: Diagnosis not present

## 2022-11-14 DIAGNOSIS — I255 Ischemic cardiomyopathy: Secondary | ICD-10-CM | POA: Diagnosis not present

## 2022-11-14 DIAGNOSIS — K219 Gastro-esophageal reflux disease without esophagitis: Secondary | ICD-10-CM | POA: Diagnosis not present

## 2022-11-16 NOTE — Progress Notes (Unsigned)
No chief complaint on file.   History of Present Illness: 87 yo female with history of CAD, ischemic cardiomyopathy, HTN, HLD, PACs and spinal stenosis here today for cardiac follow up. [facility] with an MI and had a drug eluting stent placed in the LAD. LVEF at the time of her MI was 35-40% but improved to 55-60% on echo here in our office September 2018. Cardiac monitor July 2020 with sinus and PACs. Echo December 2020 with LVEF=60-65%, normal RV function. No valve disease. She has been seen in our HTN clinic. HCTZ added at ED visit on 05/16/21 for hypertensive urgency. ED visit on 09/27/21 with BP 184/79. She then had low blood pressure so she stopped the HCTZ and irbesartan. She has been taking Cardizem and Toprol. Echo October 2023 with LVEF=65-70%. No valve disease. She was seen in the ED 08/12/22 with c/o weakness. She was found to have a pneumonia and was given antibiotics. While there she had atrial fib with RVR but converted to sinus on Cardizem drip. She was started on Eliquis and po Cardizem.   She is here today for follow up. The patient denies any chest pain, dyspnea, palpitations, lower extremity edema, orthopnea, PND, dizziness, near syncope or syncope.   Primary Care Physician: Renford DillsPolite, Ronald, MD  Past Medical History:  Diagnosis Date   Cholecystitis    Coronary artery disease    a. 03/2017: 80-90% LAD stenosis (PCI/DES placement with a 2.75x16 mm Promus Premier stent). No significant stenosis along RCA or LCx.    DJD (degenerative joint disease)    GERD (gastroesophageal reflux disease)    Hyperlipidemia    Hypertension    dx s/p MI    Ischemic cardiomyopathy    Myocardial infarction (HCC) 03/17/2017   03-2017 treated at [facility]    Osteoporosis    Status post insertion of drug-eluting stent into left anterior descending (LAD) artery 03/2017    Past Surgical History:  Procedure Laterality Date    APPENDECTOMY     CESAREAN SECTION     CHOLECYSTECTOMY N/A 11/05/2017   Procedure: LAPAROSCOPIC CHOLECYSTECTOMY WITH INTRAOPERATIVE CHOLANGIOGRAM;  Surgeon: Darnell LevelGerkin, Todd, MD;  Location: [facility];  Service: General;  Laterality: N/A;   ERCP N/A 11/11/2017   Procedure: ENDOSCOPIC RETROGRADE CHOLANGIOPANCREATOGRAPHY (ERCP);  Surgeon: Willis Modenautlaw, William, MD;  [facility];  Service: Endoscopy;  Laterality: N/A;  possible   EUS N/A 11/11/2017   Procedure: UPPER ENDOSCOPIC ULTRASOUND (EUS) RADIAL;  Surgeon: Willis Modenautlaw, William, MD;  Location: [facility];  Service: Endoscopy;  Laterality: N/A;   FEMUR IM NAIL Right 07/13/2019   Procedure: INTRAMEDULLARY (IM) RETROGRADE FEMORAL NAILING;  Surgeon: Ollen GrossAluisio, Frank, MD;  Location: [facility];  Service: Orthopedics;  Laterality: Right;  60min   LUMBAR LAMINECTOMY/ DECOMPRESSION WITH MET-RX     OPEN REDUCTION INTERNAL FIXATION ACETABULAR FRACTURE STOPPA Right 05/14/2022   Procedure: OPEN REDUCTION INTERNAL FIXATION RIGHT ACETABULUM;  Surgeon: Roby LoftsHaddix, Kevin P, MD;  Location: [facility] OR;  Service: Orthopedics;  Laterality: Right;   ORIF FEMUR FRACTURE Right 08/21/2022   Procedure: RIGHT OPEN REDUCTION INTERNAL FIXATION (ORIF) DISTAL FEMUR FRACTURE;  Surgeon: Roby LoftsHaddix, Kevin P, MD;  Location: [facility] OR;  Service: Orthopedics;  Laterality: Right;   REVERSE SHOULDER ARTHROPLASTY Right 05/15/2022   Procedure: RIGHT REVERSE SHOULDER ARTHROPLASTY;  Surgeon: Yolonda Kidaogers, Jason Patrick, MD;  [facility];  Service: Orthopedics;  Laterality: Right;   SHOULDER ARTHROSCOPY WITH ROTATOR CUFF REPAIR AND SUBACROMIAL DECOMPRESSION Right 09/20/2021  Procedure: SHOULDER MINI OPEN ROTATOR CUFF REPAIR AND SUBACROMIAL DECOMPRESSION WITH POSSIBLE PATCH GRAFT;  Surgeon: Jene Every, MD;  Location: [facility];  Service: Orthopedics;  Laterality: Right;  90 MINS   stent  Left 03/2017   anterior descending ; drug eluting ; tx of MI at [facility]    TONSILLECTOMY     VAGINAL HYSTERECTOMY      vaginal sling      Current Outpatient Medications  Medication Sig Dispense Refill   acetaminophen (TYLENOL) 500 MG tablet Take 1,000 mg by mouth See admin instructions. Take 1,000 mg by mouth at bedtime and an additional 1,000 mg once a day as needed for pain     apixaban (ELIQUIS) 5 MG TABS tablet Take 1 tablet (5 mg total) by mouth 2 (two) times daily. 60 tablet 1   ascorbic acid (VITAMIN C) 500 MG tablet Take 500 mg by mouth daily.     aspirin EC 81 MG tablet Take 81 mg by mouth in the morning. Swallow whole.     atorvastatin (LIPITOR) 80 MG tablet Take 1 tablet (80 mg total) by mouth daily. (Patient taking differently: Take 80 mg by mouth at bedtime.) 90 tablet 3   Calcium Carb-Cholecalciferol (CALCIUM 600/VITAMIN D PO) Take 1 tablet by mouth in the morning and at bedtime.     Cholecalciferol (VITAMIN D3) 2000 units capsule Take 1,000-2,000 Units by mouth daily.     CRANBERRY EXTRACT PO Take 25,000 mg by mouth 2 (two) times daily.     diltiazem (CARDIZEM CD) 120 MG 24 hr capsule Take 1 capsule (120 mg total) by mouth daily. 90 capsule 3   HYDROcodone-acetaminophen (NORCO) 7.5-325 MG tablet Take 1-2 tablets by mouth every 4 (four) hours as needed for severe pain or moderate pain (1 tablet modeate pain, 2 tablets severe pain). 30 tablet 0   latanoprost (XALATAN) 0.005 % ophthalmic solution Place 1 drop into both eyes at bedtime.     methenamine (HIPREX) 1 g tablet Take 1 g by mouth 2 (two) times daily with a meal.     metoprolol succinate (TOPROL-XL) 50 MG 24 hr tablet Take 25-50 mg by mouth See admin instructions. Take 50 mg by mouth at bedtime and an additional 25 mg once daily as needed for A-Fib. Take with or immediately following a meal.     Multiple Vitamin (MULTI-VITAMINS) TABS Take 1 tablet by mouth daily with breakfast.     omeprazole (PRILOSEC) 20 MG capsule Take 20 mg by mouth daily before breakfast.     polyethylene glycol (MIRALAX / GLYCOLAX) 17 g packet Take 17 g by mouth  daily. 14 each 0   Probiotic Product (TRUNATURE DIGESTIVE PROBIOTIC) CAPS Take 1 capsule by mouth daily.     protein supplement shake (PREMIER PROTEIN) LIQD Take 11 oz by mouth daily.     senna-docusate (SENOKOT-S) 8.6-50 MG tablet Take 1 tablet by mouth at bedtime.     SYSTANE PRESERVATIVE FREE 0.4-0.3 % SOLN Place 1 drop into the left eye 2 (two) times daily.     timolol (TIMOPTIC) 0.5 % ophthalmic solution Place 1 drop into the left eye 2 (two) times daily.     traZODone (DESYREL) 50 MG tablet Take 50 mg by mouth at bedtime as needed for sleep.     Trospium Chloride 60 MG CP24 Take 60 mg by mouth in the morning.     VITAMIN E PO Take 1 capsule by mouth daily.     No current facility-administered  medications for this visit.   Facility-Administered Medications Ordered in Other Visits  Medication Dose Route Frequency Provider Last Rate Last Admin   indomethacin (INDOCIN) 50 MG suppository 50 mg  50 mg Rectal Once Willis Modena, MD        No Known Allergies  Social History   Socioeconomic History   Marital status: Widowed    Spouse name: Not on file   Number of children: 4   Years of education: Not on file   Highest education level: Not on file  Occupational History   Occupation: Homemaker   Occupation: [employer]   Tobacco Use   Smoking status: Former    Packs/day: 1.00    Years: 30.00    Additional pack years: 0.00    Total pack years: 30.00    Types: Cigarettes   Smokeless tobacco: Never   Tobacco comments:    quit 30 years   Vaping Use   Vaping Use: Never used  Substance and Sexual Activity   Alcohol use: Yes    Comment: 3 times a year   Drug use: Never   Sexual activity: Not Currently  Other Topics Concern   Not on file  Social History Narrative   Not on file   Social Determinants of Health   Financial Resource Strain: Not on file  Food Insecurity: No Food Insecurity (08/21/2022)   Hunger Vital Sign    Worried About Running Out of  Food in the Last Year: Never true    Ran Out of Food in the Last Year: Never true  Transportation Needs: No Transportation Needs (08/21/2022)   PRAPARE - Administrator, Civil Service (Medical): No    Lack of Transportation (Non-Medical): No  Physical Activity: Not on file  Stress: Not on file  Social Connections: Not on file  Intimate Partner Violence: Not At Risk (08/21/2022)   Humiliation, Afraid, Rape, and Kick questionnaire    Fear of Current or Ex-Partner: No    Emotionally Abused: No    Physically Abused: No    Sexually Abused: No    Family History  Problem Relation Age of Onset   Heart disease Father    Breast cancer Sister    Pancreatic cancer Sister     Review of Systems:  As stated in the HPI and otherwise negative.   There were no vitals taken for this visit.  Physical Examination: General: Well developed, well nourished, NAD  HEENT: OP clear, mucus membranes moist  SKIN: warm, dry. No rashes. Neuro: No focal deficits  Musculoskeletal: Muscle strength 5/5 all ext  Psychiatric: Mood and affect normal  Neck: No JVD, no carotid bruits, no thyromegaly, no lymphadenopathy.  Lungs:Clear bilaterally, no wheezes, rhonci, crackles Cardiovascular: Regular rate and rhythm. No murmurs, gallops or rubs. Abdomen:Soft. Bowel sounds present. Non-tender.  Extremities: No lower extremity edema. Pulses are 2 + in the bilateral DP/PT.  EKG:  EKG is *** ordered today. The ekg ordered today demonstrates   Echo October 2023:  1. Left ventricular ejection fraction, by estimation, is 65 to 70%. The  left ventricle has normal function. The left ventricle has no regional  wall motion abnormalities. Left ventricular diastolic parameters are  consistent with Grade I diastolic  dysfunction (impaired relaxation).   2. Right ventricular systolic function is normal. The right ventricular  size is normal.   3. The mitral valve is normal in structure. No evidence of mitral valve   regurgitation.   4. The aortic valve is  tricuspid. There is mild calcification of the  aortic valve. There is mild thickening of the aortic valve. Aortic valve  regurgitation is not visualized. Aortic valve sclerosis is present, with  no evidence of aortic valve stenosis.   Recent Labs: 05/12/2022: TSH 2.501 05/14/2022: Magnesium 1.8 08/11/2022: B Natriuretic Peptide 89.3 08/21/2022: ALT 18 08/22/2022: BUN 24; Creatinine, Ser 0.66; Potassium 4.6; Sodium 135 08/23/2022: Hemoglobin 8.3; Platelets 230   Lipid Panel    Component Value Date/Time   CHOL 120 07/09/2017 0850   TRIG 207 (H) 07/09/2017 0850   HDL 42 07/09/2017 0850   CHOLHDL 2.9 07/09/2017 0850   LDLCALC 37 07/09/2017 0850     Wt Readings from Last 3 Encounters:  08/20/22 69 kg  08/18/22 69 kg  08/11/22 67.1 kg    Assessment and Plan:   1. CAD without angina: She had a drug eluting stent placed in the LAD in August 2018. No chest pain. Continue ASA, statin and beta blocker.        2. Ischemic cardiomyopathy: Normal LV systolic function by echo October 2023.   3. Hyperlipidemia: Lipids followed in primary care. LDL ***. Continue statin.       4. HTN: BP is well controlled. No changes  5. Atrial fibrillation, paroxysmal: Sinus today. CHADS VASC score 6. Continue Cardizem and Eliquis.   Labs/ tests ordered today include:  No orders of the defined types were placed in this encounter.  Disposition:   F/U with me in 12 months  Signed, Verne Carrow, MD 11/16/2022 12:37 PM    [facility] [address] [place], [place], [place]  [address] Phone: [phone]; Fax: [phone]

## 2022-11-17 ENCOUNTER — Encounter: Payer: Self-pay | Admitting: Cardiovascular Disease

## 2022-11-17 ENCOUNTER — Ambulatory Visit: Payer: Medicare Other | Attending: Cardiovascular Disease | Admitting: Cardiovascular Disease

## 2022-11-17 VITALS — BP 122/72 | HR 81 | Ht 62.0 in | Wt 151.2 lb

## 2022-11-17 DIAGNOSIS — M80051D Age-related osteoporosis with current pathological fracture, right femur, subsequent encounter for fracture with routine healing: Secondary | ICD-10-CM | POA: Diagnosis not present

## 2022-11-17 DIAGNOSIS — M1990 Unspecified osteoarthritis, unspecified site: Secondary | ICD-10-CM | POA: Diagnosis not present

## 2022-11-17 DIAGNOSIS — I10 Essential (primary) hypertension: Secondary | ICD-10-CM | POA: Diagnosis not present

## 2022-11-17 DIAGNOSIS — I252 Old myocardial infarction: Secondary | ICD-10-CM | POA: Diagnosis not present

## 2022-11-17 DIAGNOSIS — D649 Anemia, unspecified: Secondary | ICD-10-CM | POA: Diagnosis not present

## 2022-11-17 DIAGNOSIS — K5901 Slow transit constipation: Secondary | ICD-10-CM | POA: Diagnosis not present

## 2022-11-17 DIAGNOSIS — Z7982 Long term (current) use of aspirin: Secondary | ICD-10-CM | POA: Diagnosis not present

## 2022-11-17 DIAGNOSIS — I2510 Atherosclerotic heart disease of native coronary artery without angina pectoris: Secondary | ICD-10-CM | POA: Diagnosis not present

## 2022-11-17 DIAGNOSIS — I429 Cardiomyopathy, unspecified: Secondary | ICD-10-CM | POA: Diagnosis not present

## 2022-11-17 DIAGNOSIS — I110 Hypertensive heart disease with heart failure: Secondary | ICD-10-CM | POA: Diagnosis not present

## 2022-11-17 DIAGNOSIS — E785 Hyperlipidemia, unspecified: Secondary | ICD-10-CM

## 2022-11-17 DIAGNOSIS — I480 Paroxysmal atrial fibrillation: Secondary | ICD-10-CM | POA: Diagnosis not present

## 2022-11-17 DIAGNOSIS — I255 Ischemic cardiomyopathy: Secondary | ICD-10-CM

## 2022-11-17 DIAGNOSIS — E871 Hypo-osmolality and hyponatremia: Secondary | ICD-10-CM | POA: Diagnosis not present

## 2022-11-17 DIAGNOSIS — K219 Gastro-esophageal reflux disease without esophagitis: Secondary | ICD-10-CM | POA: Diagnosis not present

## 2022-11-17 DIAGNOSIS — I5022 Chronic systolic (congestive) heart failure: Secondary | ICD-10-CM | POA: Diagnosis not present

## 2022-11-17 DIAGNOSIS — Z7901 Long term (current) use of anticoagulants: Secondary | ICD-10-CM | POA: Diagnosis not present

## 2022-11-17 NOTE — Patient Instructions (Signed)
Medication Instructions:  No changes  *If you need a refill on your cardiac medications before your next appointment, please call your pharmacy*   Lab Work: None If you have labs (blood work) drawn today and your tests are completely normal, you will receive your results only by: MyChart Message (if you have MyChart) OR A paper copy in the mail If you have any lab test that is abnormal or we need to change your treatment, we will call you to review the results.   Testing/Procedures: none   Follow-Up: At [facility], you and your health needs are our priority.  As part of our continuing mission to provide you with exceptional heart care, we have created designated Provider Care Teams.  These Care Teams include your primary Cardiologist (physician) and Advanced Practice Providers (APPs -  Physician Assistants and Nurse Practitioners) who all work together to provide you with the care you need, when you need it.     Your next appointment:   12 month(s)  Provider:   Christopher McAlhany, MD     

## 2022-11-18 DIAGNOSIS — Z96611 Presence of right artificial shoulder joint: Secondary | ICD-10-CM | POA: Diagnosis not present

## 2022-11-18 DIAGNOSIS — Z471 Aftercare following joint replacement surgery: Secondary | ICD-10-CM | POA: Diagnosis not present

## 2022-11-19 DIAGNOSIS — I5022 Chronic systolic (congestive) heart failure: Secondary | ICD-10-CM | POA: Diagnosis not present

## 2022-11-19 DIAGNOSIS — Z7982 Long term (current) use of aspirin: Secondary | ICD-10-CM | POA: Diagnosis not present

## 2022-11-19 DIAGNOSIS — I252 Old myocardial infarction: Secondary | ICD-10-CM | POA: Diagnosis not present

## 2022-11-19 DIAGNOSIS — Z7901 Long term (current) use of anticoagulants: Secondary | ICD-10-CM | POA: Diagnosis not present

## 2022-11-19 DIAGNOSIS — I255 Ischemic cardiomyopathy: Secondary | ICD-10-CM | POA: Diagnosis not present

## 2022-11-19 DIAGNOSIS — E785 Hyperlipidemia, unspecified: Secondary | ICD-10-CM | POA: Diagnosis not present

## 2022-11-19 DIAGNOSIS — D649 Anemia, unspecified: Secondary | ICD-10-CM | POA: Diagnosis not present

## 2022-11-19 DIAGNOSIS — M80051D Age-related osteoporosis with current pathological fracture, right femur, subsequent encounter for fracture with routine healing: Secondary | ICD-10-CM | POA: Diagnosis not present

## 2022-11-19 DIAGNOSIS — E871 Hypo-osmolality and hyponatremia: Secondary | ICD-10-CM | POA: Diagnosis not present

## 2022-11-19 DIAGNOSIS — I110 Hypertensive heart disease with heart failure: Secondary | ICD-10-CM | POA: Diagnosis not present

## 2022-11-19 DIAGNOSIS — K5901 Slow transit constipation: Secondary | ICD-10-CM | POA: Diagnosis not present

## 2022-11-19 DIAGNOSIS — M1990 Unspecified osteoarthritis, unspecified site: Secondary | ICD-10-CM | POA: Diagnosis not present

## 2022-11-19 DIAGNOSIS — I429 Cardiomyopathy, unspecified: Secondary | ICD-10-CM | POA: Diagnosis not present

## 2022-11-19 DIAGNOSIS — I2510 Atherosclerotic heart disease of native coronary artery without angina pectoris: Secondary | ICD-10-CM | POA: Diagnosis not present

## 2022-11-19 DIAGNOSIS — I480 Paroxysmal atrial fibrillation: Secondary | ICD-10-CM | POA: Diagnosis not present

## 2022-11-19 DIAGNOSIS — K219 Gastro-esophageal reflux disease without esophagitis: Secondary | ICD-10-CM | POA: Diagnosis not present

## 2022-11-20 DIAGNOSIS — M1990 Unspecified osteoarthritis, unspecified site: Secondary | ICD-10-CM | POA: Diagnosis not present

## 2022-11-20 DIAGNOSIS — E785 Hyperlipidemia, unspecified: Secondary | ICD-10-CM | POA: Diagnosis not present

## 2022-11-20 DIAGNOSIS — I429 Cardiomyopathy, unspecified: Secondary | ICD-10-CM | POA: Diagnosis not present

## 2022-11-20 DIAGNOSIS — E871 Hypo-osmolality and hyponatremia: Secondary | ICD-10-CM | POA: Diagnosis not present

## 2022-11-20 DIAGNOSIS — M80051D Age-related osteoporosis with current pathological fracture, right femur, subsequent encounter for fracture with routine healing: Secondary | ICD-10-CM | POA: Diagnosis not present

## 2022-11-20 DIAGNOSIS — I2510 Atherosclerotic heart disease of native coronary artery without angina pectoris: Secondary | ICD-10-CM | POA: Diagnosis not present

## 2022-11-20 DIAGNOSIS — I255 Ischemic cardiomyopathy: Secondary | ICD-10-CM | POA: Diagnosis not present

## 2022-11-20 DIAGNOSIS — Z7901 Long term (current) use of anticoagulants: Secondary | ICD-10-CM | POA: Diagnosis not present

## 2022-11-20 DIAGNOSIS — I252 Old myocardial infarction: Secondary | ICD-10-CM | POA: Diagnosis not present

## 2022-11-20 DIAGNOSIS — I480 Paroxysmal atrial fibrillation: Secondary | ICD-10-CM | POA: Diagnosis not present

## 2022-11-20 DIAGNOSIS — K5901 Slow transit constipation: Secondary | ICD-10-CM | POA: Diagnosis not present

## 2022-11-20 DIAGNOSIS — K219 Gastro-esophageal reflux disease without esophagitis: Secondary | ICD-10-CM | POA: Diagnosis not present

## 2022-11-20 DIAGNOSIS — Z7982 Long term (current) use of aspirin: Secondary | ICD-10-CM | POA: Diagnosis not present

## 2022-11-20 DIAGNOSIS — I5022 Chronic systolic (congestive) heart failure: Secondary | ICD-10-CM | POA: Diagnosis not present

## 2022-11-20 DIAGNOSIS — I110 Hypertensive heart disease with heart failure: Secondary | ICD-10-CM | POA: Diagnosis not present

## 2022-11-20 DIAGNOSIS — D649 Anemia, unspecified: Secondary | ICD-10-CM | POA: Diagnosis not present

## 2022-11-24 DIAGNOSIS — S72401D Unspecified fracture of lower end of right femur, subsequent encounter for closed fracture with routine healing: Secondary | ICD-10-CM | POA: Diagnosis not present

## 2022-11-24 DIAGNOSIS — S32401D Unspecified fracture of right acetabulum, subsequent encounter for fracture with routine healing: Secondary | ICD-10-CM | POA: Diagnosis not present

## 2022-11-25 DIAGNOSIS — I2510 Atherosclerotic heart disease of native coronary artery without angina pectoris: Secondary | ICD-10-CM | POA: Diagnosis not present

## 2022-11-25 DIAGNOSIS — M1990 Unspecified osteoarthritis, unspecified site: Secondary | ICD-10-CM | POA: Diagnosis not present

## 2022-11-25 DIAGNOSIS — I5022 Chronic systolic (congestive) heart failure: Secondary | ICD-10-CM | POA: Diagnosis not present

## 2022-11-25 DIAGNOSIS — I252 Old myocardial infarction: Secondary | ICD-10-CM | POA: Diagnosis not present

## 2022-11-25 DIAGNOSIS — M80051D Age-related osteoporosis with current pathological fracture, right femur, subsequent encounter for fracture with routine healing: Secondary | ICD-10-CM | POA: Diagnosis not present

## 2022-11-25 DIAGNOSIS — I110 Hypertensive heart disease with heart failure: Secondary | ICD-10-CM | POA: Diagnosis not present

## 2022-11-25 DIAGNOSIS — Z7901 Long term (current) use of anticoagulants: Secondary | ICD-10-CM | POA: Diagnosis not present

## 2022-11-25 DIAGNOSIS — K5901 Slow transit constipation: Secondary | ICD-10-CM | POA: Diagnosis not present

## 2022-11-25 DIAGNOSIS — I429 Cardiomyopathy, unspecified: Secondary | ICD-10-CM | POA: Diagnosis not present

## 2022-11-25 DIAGNOSIS — Z7982 Long term (current) use of aspirin: Secondary | ICD-10-CM | POA: Diagnosis not present

## 2022-11-25 DIAGNOSIS — I480 Paroxysmal atrial fibrillation: Secondary | ICD-10-CM | POA: Diagnosis not present

## 2022-11-25 DIAGNOSIS — D649 Anemia, unspecified: Secondary | ICD-10-CM | POA: Diagnosis not present

## 2022-11-25 DIAGNOSIS — I255 Ischemic cardiomyopathy: Secondary | ICD-10-CM | POA: Diagnosis not present

## 2022-11-25 DIAGNOSIS — E871 Hypo-osmolality and hyponatremia: Secondary | ICD-10-CM | POA: Diagnosis not present

## 2022-11-25 DIAGNOSIS — K219 Gastro-esophageal reflux disease without esophagitis: Secondary | ICD-10-CM | POA: Diagnosis not present

## 2022-11-25 DIAGNOSIS — E785 Hyperlipidemia, unspecified: Secondary | ICD-10-CM | POA: Diagnosis not present

## 2022-11-27 DIAGNOSIS — I252 Old myocardial infarction: Secondary | ICD-10-CM | POA: Diagnosis not present

## 2022-11-27 DIAGNOSIS — I110 Hypertensive heart disease with heart failure: Secondary | ICD-10-CM | POA: Diagnosis not present

## 2022-11-27 DIAGNOSIS — M810 Age-related osteoporosis without current pathological fracture: Secondary | ICD-10-CM | POA: Diagnosis not present

## 2022-11-27 DIAGNOSIS — K219 Gastro-esophageal reflux disease without esophagitis: Secondary | ICD-10-CM | POA: Diagnosis not present

## 2022-11-27 DIAGNOSIS — I480 Paroxysmal atrial fibrillation: Secondary | ICD-10-CM | POA: Diagnosis not present

## 2022-11-27 DIAGNOSIS — E871 Hypo-osmolality and hyponatremia: Secondary | ICD-10-CM | POA: Diagnosis not present

## 2022-11-27 DIAGNOSIS — E785 Hyperlipidemia, unspecified: Secondary | ICD-10-CM | POA: Diagnosis not present

## 2022-11-27 DIAGNOSIS — I429 Cardiomyopathy, unspecified: Secondary | ICD-10-CM | POA: Diagnosis not present

## 2022-11-27 DIAGNOSIS — Z7901 Long term (current) use of anticoagulants: Secondary | ICD-10-CM | POA: Diagnosis not present

## 2022-11-27 DIAGNOSIS — M80051D Age-related osteoporosis with current pathological fracture, right femur, subsequent encounter for fracture with routine healing: Secondary | ICD-10-CM | POA: Diagnosis not present

## 2022-11-27 DIAGNOSIS — I2510 Atherosclerotic heart disease of native coronary artery without angina pectoris: Secondary | ICD-10-CM | POA: Diagnosis not present

## 2022-11-27 DIAGNOSIS — M1990 Unspecified osteoarthritis, unspecified site: Secondary | ICD-10-CM | POA: Diagnosis not present

## 2022-11-27 DIAGNOSIS — Z7982 Long term (current) use of aspirin: Secondary | ICD-10-CM | POA: Diagnosis not present

## 2022-11-27 DIAGNOSIS — I255 Ischemic cardiomyopathy: Secondary | ICD-10-CM | POA: Diagnosis not present

## 2022-11-27 DIAGNOSIS — I5022 Chronic systolic (congestive) heart failure: Secondary | ICD-10-CM | POA: Diagnosis not present

## 2022-11-27 DIAGNOSIS — D649 Anemia, unspecified: Secondary | ICD-10-CM | POA: Diagnosis not present

## 2022-11-27 DIAGNOSIS — K5901 Slow transit constipation: Secondary | ICD-10-CM | POA: Diagnosis not present

## 2022-12-03 DIAGNOSIS — K5901 Slow transit constipation: Secondary | ICD-10-CM | POA: Diagnosis not present

## 2022-12-03 DIAGNOSIS — I480 Paroxysmal atrial fibrillation: Secondary | ICD-10-CM | POA: Diagnosis not present

## 2022-12-03 DIAGNOSIS — I2510 Atherosclerotic heart disease of native coronary artery without angina pectoris: Secondary | ICD-10-CM | POA: Diagnosis not present

## 2022-12-03 DIAGNOSIS — Z7982 Long term (current) use of aspirin: Secondary | ICD-10-CM | POA: Diagnosis not present

## 2022-12-03 DIAGNOSIS — K219 Gastro-esophageal reflux disease without esophagitis: Secondary | ICD-10-CM | POA: Diagnosis not present

## 2022-12-03 DIAGNOSIS — I255 Ischemic cardiomyopathy: Secondary | ICD-10-CM | POA: Diagnosis not present

## 2022-12-03 DIAGNOSIS — E785 Hyperlipidemia, unspecified: Secondary | ICD-10-CM | POA: Diagnosis not present

## 2022-12-03 DIAGNOSIS — I252 Old myocardial infarction: Secondary | ICD-10-CM | POA: Diagnosis not present

## 2022-12-03 DIAGNOSIS — D649 Anemia, unspecified: Secondary | ICD-10-CM | POA: Diagnosis not present

## 2022-12-03 DIAGNOSIS — I5022 Chronic systolic (congestive) heart failure: Secondary | ICD-10-CM | POA: Diagnosis not present

## 2022-12-03 DIAGNOSIS — Z7901 Long term (current) use of anticoagulants: Secondary | ICD-10-CM | POA: Diagnosis not present

## 2022-12-03 DIAGNOSIS — M80051D Age-related osteoporosis with current pathological fracture, right femur, subsequent encounter for fracture with routine healing: Secondary | ICD-10-CM | POA: Diagnosis not present

## 2022-12-03 DIAGNOSIS — E871 Hypo-osmolality and hyponatremia: Secondary | ICD-10-CM | POA: Diagnosis not present

## 2022-12-03 DIAGNOSIS — I110 Hypertensive heart disease with heart failure: Secondary | ICD-10-CM | POA: Diagnosis not present

## 2022-12-03 DIAGNOSIS — M1990 Unspecified osteoarthritis, unspecified site: Secondary | ICD-10-CM | POA: Diagnosis not present

## 2022-12-03 DIAGNOSIS — I429 Cardiomyopathy, unspecified: Secondary | ICD-10-CM | POA: Diagnosis not present

## 2022-12-15 ENCOUNTER — Ambulatory Visit: Payer: Medicare Other | Attending: Physician Assistant | Admitting: Physical Therapy

## 2022-12-15 ENCOUNTER — Other Ambulatory Visit: Payer: Self-pay

## 2022-12-15 ENCOUNTER — Encounter: Payer: Self-pay | Admitting: Physical Therapy

## 2022-12-15 DIAGNOSIS — M6281 Muscle weakness (generalized): Secondary | ICD-10-CM | POA: Diagnosis not present

## 2022-12-15 DIAGNOSIS — M25551 Pain in right hip: Secondary | ICD-10-CM

## 2022-12-15 DIAGNOSIS — M25651 Stiffness of right hip, not elsewhere classified: Secondary | ICD-10-CM

## 2022-12-15 DIAGNOSIS — R2689 Other abnormalities of gait and mobility: Secondary | ICD-10-CM | POA: Diagnosis not present

## 2022-12-15 NOTE — Therapy (Addendum)
OUTPATIENT PHYSICAL THERAPY LOWER EXTREMITY EVALUATION   Patient Name: Carrie Barnes MRN: [id number] DOB:[birth year], 87 y.o., female Today's Date: 12/15/2022  END OF SESSION:  PT End of Session - 12/15/22 1232     Visit Number 1    Number of Visits 16    Date for PT Re-Evaluation 02/09/23    Authorization Type BCBS Medicare    Progress Note Due on Visit 10    PT Start Time 1232    PT Stop Time 1315    PT Time Calculation (min) 43 min    Activity Tolerance Patient tolerated treatment well    Behavior During Therapy [facility] for tasks assessed/performed             Past Medical History:  Diagnosis Date   Cholecystitis    Coronary artery disease    a. 03/2017: 80-90% LAD stenosis (PCI/DES placement with a 2.75x16 mm Promus Premier stent). No significant stenosis along RCA or LCx.    DJD (degenerative joint disease)    GERD (gastroesophageal reflux disease)    Hyperlipidemia    Hypertension    dx s/p MI    Ischemic cardiomyopathy    Myocardial infarction (HCC) 03/17/2017   03-2017 treated at [facility]    Osteoporosis    Status post insertion of drug-eluting stent into left anterior descending (LAD) artery 03/2017   Past Surgical History:  Procedure Laterality Date   APPENDECTOMY     CESAREAN SECTION     CHOLECYSTECTOMY N/A 11/05/2017   Procedure: LAPAROSCOPIC CHOLECYSTECTOMY WITH INTRAOPERATIVE CHOLANGIOGRAM;  Surgeon: Darnell Level, MD;  Location: [facility];  Service: General;  Laterality: N/A;   ERCP N/A 11/11/2017   Procedure: ENDOSCOPIC RETROGRADE CHOLANGIOPANCREATOGRAPHY (ERCP);  Surgeon: Willis Modena, MD;  Location: [facility];  Service: Endoscopy;  Laterality: N/A;  possible   EUS N/A 11/11/2017   Procedure: UPPER ENDOSCOPIC ULTRASOUND (EUS) RADIAL;  Surgeon: Willis Modena, MD;  Location: [facility];  Service: Endoscopy;  Laterality: N/A;   FEMUR IM NAIL Right 07/13/2019   Procedure: INTRAMEDULLARY (IM) RETROGRADE FEMORAL NAILING;  Surgeon:  Ollen Gross, MD;  Location: [facility];  Service: Orthopedics;  Laterality: Right;    LUMBAR LAMINECTOMY/ DECOMPRESSION WITH MET-RX     OPEN REDUCTION INTERNAL FIXATION ACETABULAR FRACTURE STOPPA Right 05/14/2022   Procedure: OPEN REDUCTION INTERNAL FIXATION RIGHT ACETABULUM;  Surgeon: Roby Lofts, MD;  Location: [facility];  Service: Orthopedics;  Laterality: Right;   ORIF FEMUR FRACTURE Right 08/21/2022   Procedure: RIGHT OPEN REDUCTION INTERNAL FIXATION (ORIF) DISTAL FEMUR FRACTURE;  Surgeon: Roby Lofts, MD;  Location: [facility];  Service: Orthopedics;  Laterality: Right;   REVERSE SHOULDER ARTHROPLASTY Right 05/15/2022   Procedure: RIGHT REVERSE SHOULDER ARTHROPLASTY;  Surgeon: Yolonda Kida, MD;  Location: [facility] OR;  Service: Orthopedics;  Laterality: Right;   SHOULDER ARTHROSCOPY WITH ROTATOR CUFF REPAIR AND SUBACROMIAL DECOMPRESSION Right 09/20/2021   Procedure: SHOULDER MINI OPEN ROTATOR CUFF REPAIR AND SUBACROMIAL DECOMPRESSION WITH POSSIBLE PATCH GRAFT;  Surgeon: Jene Every, MD;  Location: [facility];  Service: Orthopedics;  Laterality: Right;  90 MINS   stent  Left 03/2017   anterior descending ; drug eluting ; tx of MI at [facility]    TONSILLECTOMY     VAGINAL HYSTERECTOMY     vaginal sling     Patient Active Problem List   Diagnosis Date Noted   Closed bicondylar fracture of distal femur (HCC) 08/20/2022   Hyponatremia 08/20/2022   Hypomagnesemia 05/13/2022  Pelvis fracture (HCC) 05/12/2022   Humeral head fracture, right, closed, initial encounter 05/12/2022   Elevated troponin 05/12/2022   Hematoma 05/12/2022   Hypertension 11/05/2021   Complete rotator cuff tear 09/20/2021   Closed intertrochanteric fracture of right hip (HCC) 07/11/2019   Closed right hip fracture (HCC) 07/11/2019   Chronic cholecystitis due to cholelithiasis with choledocholithiasis 11/05/2017   Cholelithiasis with chronic cholecystitis 11/01/2017   Cholelithiasis 05/18/2017    CAD in native artery 03/31/2017   Ischemic cardiomyopathy 03/31/2017   Hyperlipemia 03/31/2017   DJD (degenerative joint disease) 03/31/2017   Osteoporosis 03/31/2017    PCP: Renford Dills, MD  REFERRING PROVIDER: Dion Saucier D, PA  REFERRING DIAG: ORIF R Distal Femur FX 08/21/22 ORIF R Acetabulum FX 05/14/22  THERAPY DIAG:  Muscle weakness (generalized)  Other abnormalities of gait and mobility  Pain in right hip  Stiffness of right hip, not elsewhere classified  Rationale for Evaluation and Treatment: Rehabilitation  ONSET DATE: R hip ORIF 08/21/22  SUBJECTIVE:   SUBJECTIVE STATEMENT: Pt reports she fell in October 2023 while putting groceries up in her car in the parking lot. Pt had R rTSA and R hip surgery. Pt fell on knee in January 2024 (caught her foot on walker) and fell over resulting in most current R hip ORIF. Pt states she has broken her R hip 3x. Has been doing HHPT (recently d/c-ed). Prior to October 2023 pt did not use a/d and was independent with all community mobility and driving. Has been using rollator pretty much all the time since October and has mostly remained in the house. States back pain gets aggravated with prolonged standing (~10 min).   PERTINENT HISTORY: From H&P: 87 yo female with history of CAD, ischemic cardiomyopathy, paroxysmal atrial fibrillation, HTN, HLD, PACs and spinal stenosis 3rd R hip fx, R shoulder fx  PAIN:  Are you having pain? Yes: NPRS scale: 0 at rest, 5 or 6 when standing or walking/10 Pain location: R front thigh Pain description: dull Aggravating factors: standing/walking Relieving factors: rest  PRECAUTIONS: None  WEIGHT BEARING RESTRICTIONS: No  FALLS:  Has patient fallen in last 6 months? Yes. Number of falls 2  LIVING ENVIRONMENT: Lives with: lives alone Daughter lives close; family checks in daily Lives in: House/apartment Stairs: No Has following equipment at home: Dan Humphreys - 4 wheeled, shower chair, bed  side commode, and Ramped entry  OCCUPATION: Retired  PLOF: Independent  PATIENT GOALS: "I just want to get stronger and not be susceptible to losing balance"  NEXT MD VISIT: n/a  OBJECTIVE:   DIAGNOSTIC FINDINGS: 08/21/22 hip x-ray demos ORIF  PATIENT SURVEYS:  FOTO 40; predicted 53  COGNITION: Overall cognitive status: Within functional limits for tasks assessed     SENSATION: WFL  EDEMA:  Notes a little R posterior thigh swelling  MUSCLE LENGTH: Hamstrings: Right ~70 deg; Left ~70 deg Thomas test: Right -10 deg; Left 10 deg  POSTURE: flexed trunk   PALPATION: No overt tenderness to palpation  LOWER EXTREMITY ROM:  Active/Passive ROM Right eval Left eval  Hip flexion 80/100 125/125  Hip extension    Hip abduction    Hip adduction    Hip internal rotation    Hip external rotation    Knee flexion    Knee extension    Ankle dorsiflexion    Ankle plantarflexion    Ankle inversion    Ankle eversion     (Blank rows = not tested)  LOWER EXTREMITY MMT:  MMT Right  eval Left eval  Hip flexion 3-* 5  Hip extension 3- 3+  Hip abduction 2+ 3-  Hip adduction    Hip internal rotation    Hip external rotation    Knee flexion 4+ 5  Knee extension 3+ 5  Ankle dorsiflexion    Ankle plantarflexion    Ankle inversion    Ankle eversion     (Blank rows = not tested) * = concordant pain  LOWER EXTREMITY SPECIAL TESTS:  Did not assess  FUNCTIONAL TESTS:  5 times sit to stand: TBA Berg Balance test: TBA  GAIT: Distance walked: 100' Assistive device utilized: Environmental consultant - 4 wheeled Level of assistance: Modified independence Comments: Reciprocal gait, flexed trunk   TODAY'S TREATMENT:                                                                                                                              DATE: 12/15/22 See HEP below    PATIENT EDUCATION:  Education details: Exam findings, POC, initial HEP Person educated: Patient Education method:  Explanation, Demonstration, and Handouts Education comprehension: verbalized understanding, returned demonstration, and needs further education  HOME EXERCISE PROGRAM: Access Code: [email or web address] URL: [email or web address] Date: 12/15/2022 Prepared by: Vernon Prey April Kirstie Peri  Exercises - Prone Gluteal Sets  - 1 x daily - 7 x weekly - 2 sets - 30 sec hold - Prone Knee Flexion  - 1 x daily - 7 x weekly - 2 sets - 10 reps - Supine Bridge  - 1 x daily - 7 x weekly - 2 sets - 10 reps - Clamshell  - 1 x daily - 7 x weekly - 2 sets - 10 reps - Hooklying Single Knee to Chest Stretch  - 1 x daily - 7 x weekly - 2 sets - 30 sec hold  ASSESSMENT:  CLINICAL IMPRESSION: Patient is a 87 y.o. F who was seen today for physical therapy evaluation and treatment for R hip ORIF. Pt PMH significant for prior falls resulting in R hip fractures and surgeries (3 total) and R rTSA. Pt has been receiving HHPT and was d/ced last week. On assessment, pt demos gross R hip weakness with difficulty lifting against gravity resulting in decreased AROM in all planes of motion. Pt has R hip flexor, quad, and hamstring tightness also limiting ROM. Focused on updating pt's HEP. Will need to have balance assessed next session. Pt would benefit from PT to reduce risk of further falls and return to independent living.   OBJECTIVE IMPAIRMENTS: Abnormal gait, decreased activity tolerance, decreased balance, decreased endurance, decreased mobility, difficulty walking, decreased ROM, decreased strength, hypomobility, increased fascial restrictions, impaired flexibility, improper body mechanics, postural dysfunction, and pain.   ACTIVITY LIMITATIONS: lifting, bending, standing, squatting, stairs, transfers, bed mobility, hygiene/grooming, and locomotion level  PARTICIPATION LIMITATIONS: meal prep, cleaning, laundry, driving, shopping, and community activity  PERSONAL FACTORS: Age, Fitness, Past/current experiences, and  Time since onset of injury/illness/exacerbation are also affecting patient's  functional outcome.   REHAB POTENTIAL: Good  CLINICAL DECISION MAKING: Evolving/moderate complexity  EVALUATION COMPLEXITY: Moderate   GOALS: Goals reviewed with patient? Yes  SHORT TERM GOALS: Target date: 01/12/2023  Pt will be ind with initial HEP Baseline: Goal status: INITIAL  2.  PT will assess 5x STS and set goals accordingly Baseline:  Goal status: INITIAL  3.  PT will assess Berg Balance and set goals accordingly Baseline:  Goal status: INITIAL  4.  Pt will be able to improve R hip flexion to at least 100 deg in sitting Baseline:  Goal status: INITIAL   LONG TERM GOALS: Target date: 02/09/2023   Pt will be ind with management and progression of HEP Baseline:  Goal status: INITIAL  2.  Pt will have 5x STS of </=13 sec to demo decreased risk of falls and improved functional LE strength Baseline:  Goal status: INITIAL  3.  Pt will have improved Berg Balance Score by at least 8 points to demo MCID for decreased fall risk Baseline:  Goal status: INITIAL  4.  Pt will be able to perform household amb (at least 400') without a/d  Baseline:  Goal status: INITIAL  5.  Pt will be able to tolerate standing for at least 20 minutes to be able to cook/wash dishes Baseline:  Goal status: INITIAL  6.  Pt will have improved FOTO to >/=53 Baseline:  Goal status: INITIAL   PLAN:  PT FREQUENCY: 2x/week  PT DURATION: 8 weeks  PLANNED INTERVENTIONS: Therapeutic exercises, Therapeutic activity, Neuromuscular re-education, Balance training, Gait training, Patient/Family education, Self Care, Joint mobilization, Stair training, Aquatic Therapy, Cryotherapy, Moist heat, Taping, Ionotophoresis 4mg /ml Dexamethasone, Manual therapy, and Re-evaluation  PLAN FOR NEXT SESSION: Assess response to HEP and modify accordingly. Continue to work on hip ROM and strengthening. Assess 5xSTS and  Berg.   Shandi Godfrey April Ma L Reese Stockman, PT 12/15/2022, 1:41 PM

## 2022-12-19 ENCOUNTER — Ambulatory Visit: Payer: Medicare Other | Admitting: Physical Therapy

## 2022-12-19 DIAGNOSIS — M25551 Pain in right hip: Secondary | ICD-10-CM | POA: Diagnosis not present

## 2022-12-19 DIAGNOSIS — M25651 Stiffness of right hip, not elsewhere classified: Secondary | ICD-10-CM | POA: Diagnosis not present

## 2022-12-19 DIAGNOSIS — R2689 Other abnormalities of gait and mobility: Secondary | ICD-10-CM | POA: Diagnosis not present

## 2022-12-19 DIAGNOSIS — M6281 Muscle weakness (generalized): Secondary | ICD-10-CM

## 2022-12-19 NOTE — Therapy (Signed)
OUTPATIENT PHYSICAL THERAPY TREATMENT   Patient Name: Carrie Barnes MRN: [id number] DOB:[birth year], 87 y.o., female Today's Date: 12/19/2022  END OF SESSION:  PT End of Session - 12/19/22 0852     Visit Number 2    Number of Visits 16    Date for PT Re-Evaluation 02/09/23    Authorization Type BCBS Medicare    Progress Note Due on Visit 10    PT Start Time 0850    PT Stop Time 0930    PT Time Calculation (min) 40 min    Activity Tolerance Patient tolerated treatment well    Behavior During Therapy [facility] for tasks assessed/performed              Past Medical History:  Diagnosis Date   Cholecystitis    Coronary artery disease    a. 03/2017: 80-90% LAD stenosis (PCI/DES placement with a 2.75x16 mm Promus Premier stent). No significant stenosis along RCA or LCx.    DJD (degenerative joint disease)    GERD (gastroesophageal reflux disease)    Hyperlipidemia    Hypertension    dx s/p MI    Ischemic cardiomyopathy    Myocardial infarction (HCC) 03/17/2017   03-2017 treated at [facility]    Osteoporosis    Status post insertion of drug-eluting stent into left anterior descending (LAD) artery 03/2017   Past Surgical History:  Procedure Laterality Date   APPENDECTOMY     CESAREAN SECTION     CHOLECYSTECTOMY N/A 11/05/2017   Procedure: LAPAROSCOPIC CHOLECYSTECTOMY WITH INTRAOPERATIVE CHOLANGIOGRAM;  Surgeon: Darnell Level, MD;  Location: [facility];  Service: General;  Laterality: N/A;   ERCP N/A 11/11/2017   Procedure: ENDOSCOPIC RETROGRADE CHOLANGIOPANCREATOGRAPHY (ERCP);  Surgeon: Willis Modena, MD;  Location: [facility];  Service: Endoscopy;  Laterality: N/A;  possible   EUS N/A 11/11/2017   Procedure: UPPER ENDOSCOPIC ULTRASOUND (EUS) RADIAL;  Surgeon: Willis Modena, MD;  Location: [facility];  Service: Endoscopy;  Laterality: N/A;   FEMUR IM NAIL Right 07/13/2019   Procedure: INTRAMEDULLARY (IM) RETROGRADE FEMORAL NAILING;  Surgeon: Ollen Gross,  MD;  Location: [facility];  Service: Orthopedics;  Laterality: Right;    LUMBAR LAMINECTOMY/ DECOMPRESSION WITH MET-RX     OPEN REDUCTION INTERNAL FIXATION ACETABULAR FRACTURE STOPPA Right 05/14/2022   Procedure: OPEN REDUCTION INTERNAL FIXATION RIGHT ACETABULUM;  Surgeon: Roby Lofts, MD;  Location: [facility];  Service: Orthopedics;  Laterality: Right;   ORIF FEMUR FRACTURE Right 08/21/2022   Procedure: RIGHT OPEN REDUCTION INTERNAL FIXATION (ORIF) DISTAL FEMUR FRACTURE;  Surgeon: Roby Lofts, MD;  Location: [facility];  Service: Orthopedics;  Laterality: Right;   REVERSE SHOULDER ARTHROPLASTY Right 05/15/2022   Procedure: RIGHT REVERSE SHOULDER ARTHROPLASTY;  Surgeon: Yolonda Kida, MD;  Location[facility];  Service: Orthopedics;  Laterality: Right;   SHOULDER ARTHROSCOPY WITH ROTATOR CUFF REPAIR AND SUBACROMIAL DECOMPRESSION Right 09/20/2021   Procedure: SHOULDER MINI OPEN ROTATOR CUFF REPAIR AND SUBACROMIAL DECOMPRESSION WITH POSSIBLE PATCH GRAFT;  Surgeon: Jene Every, MD;  Location: [facility];  Service: Orthopedics;  Laterality: Right;  90 MINS   stent  Left 03/2017   anterior descending ; drug eluting ; tx of MI at [facility]    TONSILLECTOMY     VAGINAL HYSTERECTOMY     vaginal sling     Patient Active Problem List   Diagnosis Date Noted   Closed bicondylar fracture of distal femur (HCC) 08/20/2022   Hyponatremia 08/20/2022   Hypomagnesemia 05/13/2022   Pelvis  fracture (HCC) 05/12/2022   Humeral head fracture, right, closed, initial encounter 05/12/2022   Elevated troponin 05/12/2022   Hematoma 05/12/2022   Hypertension 11/05/2021   Complete rotator cuff tear 09/20/2021   Closed intertrochanteric fracture of right hip (HCC) 07/11/2019   Closed right hip fracture (HCC) 07/11/2019   Chronic cholecystitis due to cholelithiasis with choledocholithiasis 11/05/2017   Cholelithiasis with chronic cholecystitis 11/01/2017   Cholelithiasis 05/18/2017   CAD in native  artery 03/31/2017   Ischemic cardiomyopathy 03/31/2017   Hyperlipemia 03/31/2017   DJD (degenerative joint disease) 03/31/2017   Osteoporosis 03/31/2017    PCP: Renford Dills, MD  REFERRING PROVIDER: Dion Saucier D, PA  REFERRING DIAG: ORIF R Distal Femur FX 08/21/22 ORIF R Acetabulum FX 05/14/22  THERAPY DIAG:  Muscle weakness (generalized)  Other abnormalities of gait and mobility  Pain in right hip  Stiffness of right hip, not elsewhere classified  Rationale for Evaluation and Treatment: Rehabilitation  ONSET DATE: R hip ORIF 08/21/22  SUBJECTIVE:   SUBJECTIVE STATEMENT: Pt states she was very sore after initial eval. Pt reports she had to skip one day of exercise but has been able to do them. Notes some sharp pain when bringing leg up to chest.   PERTINENT HISTORY: From H&P: 87 yo female with history of CAD, ischemic cardiomyopathy, paroxysmal atrial fibrillation, HTN, HLD, PACs and spinal stenosis 3rd R hip fx, R shoulder fx  PAIN:  Are you having pain? Yes: NPRS scale: 0 at rest, 5 or 6 when standing or walking/10 Pain location: R front thigh Pain description: dull Aggravating factors: standing/walking Relieving factors: rest  PRECAUTIONS: None  WEIGHT BEARING RESTRICTIONS: No  FALLS:  Has patient fallen in last 6 months? Yes. Number of falls 2  LIVING ENVIRONMENT: Lives with: lives alone Daughter lives close; family checks in daily Lives in: House/apartment Stairs: No Has following equipment at home: Dan Humphreys - 4 wheeled, shower chair, bed side commode, and Ramped entry  OCCUPATION: Retired  PATIENT GOALS: "I just want to get stronger and not be susceptible to losing balance"  NEXT MD VISIT: n/a  OBJECTIVE: (Measures in this section from initial evaluation unless otherwise noted)  DIAGNOSTIC FINDINGS: 08/21/22 hip x-ray demos ORIF  PATIENT SURVEYS:  FOTO 40; predicted 53   SENSATION: WFL  EDEMA:  Notes a little R posterior thigh  swelling  MUSCLE LENGTH: Hamstrings: Right ~70 deg; Left ~70 deg Thomas test: Right -10 deg; Left 10 deg  POSTURE: flexed trunk   LOWER EXTREMITY ROM:  Active/Passive ROM Right eval Left eval  Hip flexion 80/100 125/125  Hip extension    Hip abduction    Hip adduction    Hip internal rotation    Hip external rotation    Knee flexion    Knee extension    Ankle dorsiflexion    Ankle plantarflexion    Ankle inversion    Ankle eversion     (Blank rows = not tested)  LOWER EXTREMITY MMT:  MMT Right eval Left eval  Hip flexion 3-* 5  Hip extension 3- 3+  Hip abduction 2+ 3-  Hip adduction    Hip internal rotation    Hip external rotation    Knee flexion 4+ 5  Knee extension 3+ 5  Ankle dorsiflexion    Ankle plantarflexion    Ankle inversion    Ankle eversion     (Blank rows = not tested) * = concordant pain  FUNCTIONAL TESTS:  5 times sit to stand: 12.95 sec Sharlene Motts  Balance test: 40/56  [facility] PT Assessment - 12/19/22 0001       Standardized Balance Assessment   Standardized Balance Assessment Berg Balance Test      Berg Balance Test   Sit to Stand Able to stand  independently using hands    Standing Unsupported Able to stand 2 minutes with supervision    Sitting with Back Unsupported but Feet Supported on Floor or Stool Able to sit safely and securely 2 minutes    Stand to Sit Controls descent by using hands    Transfers Able to transfer safely, definite need of hands    Standing Unsupported with Eyes Closed Able to stand 10 seconds safely    Standing Unsupported with Feet Together Able to place feet together independently and stand for 1 minute with supervision    From Standing, Reach Forward with Outstretched Arm Can reach forward >12 cm safely (5")    From Standing Position, Pick up Object from Floor Able to pick up shoe, needs supervision    From Standing Position, Turn to Look Behind Over each Shoulder Looks behind one side only/other side shows less  weight shift    Turn 360 Degrees Able to turn 360 degrees safely but slowly    Standing Unsupported, Alternately Place Feet on Step/Stool Able to stand independently and complete 8 steps >20 seconds    Standing Unsupported, One Foot in Front Able to plae foot ahead of the other independently and hold 30 seconds    Standing on One Leg Unable to try or needs assist to prevent fall   Limited by pain on R LE   Total Score 40              GAIT: Distance walked: 100' Assistive device utilized: Environmental consultant - 4 wheeled Level of assistance: Modified independence Comments: Reciprocal gait, flexed trunk   TODAY'S TREATMENT:      [facility] Adult PT Treatment:                                                DATE: 12/19/22 Therapeutic Exercise: Nustep L5 x5 min, UEs/LEs Supine hip flexion eccentrics with belt support 2x10 Supine bridge 2x10 Supine SAQ 2x10 S/L clamshell 2x10 Prone quad stretch x30 sec Prone glute set with towel under R knee for hip flexor stretch 2x10 Prone hip ext with knee flexed x10 Manual Therapy: STM & TPR R quad Neuromuscular re-ed: See balance assessments above                                                                                                                            DATE: 12/15/22 See HEP below    PATIENT EDUCATION:  Education details: Exam findings, POC, initial HEP Person educated: Patient Education method: Explanation, Demonstration, and Handouts Education comprehension: verbalized understanding, returned demonstration, and needs further  education  HOME EXERCISE PROGRAM: Access Code: [email or web address] URL: [email or web address] Date: 12/15/2022 Prepared by: Vernon Prey April Kirstie Peri  Exercises - Prone Gluteal Sets  - 1 x daily - 7 x weekly - 2 sets - 30 sec hold - Prone Knee Flexion  - 1 x daily - 7 x weekly - 2 sets - 10 reps - Supine Bridge  - 1 x daily - 7 x weekly - 2 sets - 10 reps - Clamshell  - 1 x daily - 7 x weekly - 2 sets - 10  reps - Hooklying Single Knee to Chest Stretch  - 1 x daily - 7 x weekly - 2 sets - 30 sec hold  ASSESSMENT:  CLINICAL IMPRESSION: Focused on reviewing HEP and modifying to be less painful for pt. Continued gross hip strengthening with good pt tolerance. Checked pt's balance to set goals accordingly. Pt currently demonstrates high fall risk based on her Berg Balance Score.   OBJECTIVE IMPAIRMENTS: Abnormal gait, decreased activity tolerance, decreased balance, decreased endurance, decreased mobility, difficulty walking, decreased ROM, decreased strength, hypomobility, increased fascial restrictions, impaired flexibility, improper body mechanics, postural dysfunction, and pain.   GOALS: Goals reviewed with patient? Yes  SHORT TERM GOALS: Target date: 01/12/2023  Pt will be ind with initial HEP Baseline: Goal status: INITIAL  2.  PT will assess 5x STS and set goals accordingly Baseline:  Goal status: INITIAL  3.  PT will assess Berg Balance and set goals accordingly Baseline:  Goal status: INITIAL  4.  Pt will be able to improve R hip flexion to at least 100 deg in sitting Baseline:  Goal status: INITIAL   LONG TERM GOALS: Target date: 02/09/2023   Pt will be ind with management and progression of HEP Baseline:  Goal status: INITIAL  2.  Pt will have 5x STS of </=10 sec to demo decreased risk of falls and improved functional LE strength Baseline: 12.96 sec Goal status: INITIAL  3.  Pt will have improved Berg Balance Score by at least 8 points to demo MCID for decreased fall risk Baseline: 40/56 Goal status: INITIAL  4.  Pt will be able to perform household amb (at least 400') without a/d  Baseline:  Goal status: INITIAL  5.  Pt will be able to tolerate standing for at least 20 minutes to be able to cook/wash dishes Baseline:  Goal status: INITIAL  6.  Pt will have improved FOTO to >/=53 Baseline:  Goal status: INITIAL   PLAN:  PT FREQUENCY: 2x/week  PT  DURATION: 8 weeks  PLANNED INTERVENTIONS: Therapeutic exercises, Therapeutic activity, Neuromuscular re-education, Balance training, Gait training, Patient/Family education, Self Care, Joint mobilization, Stair training, Aquatic Therapy, Cryotherapy, Moist heat, Taping, Ionotophoresis 4mg /ml Dexamethasone, Manual therapy, and Re-evaluation  PLAN FOR NEXT SESSION: Assess response to HEP and modify accordingly. Continue to work on hip ROM and strengthening.    Francys Bolin April Ma L Nahia Nissan, PT 12/19/2022, 8:53 AM

## 2022-12-26 ENCOUNTER — Ambulatory Visit: Payer: Medicare Other | Admitting: Physical Therapy

## 2022-12-26 DIAGNOSIS — M6281 Muscle weakness (generalized): Secondary | ICD-10-CM | POA: Diagnosis not present

## 2022-12-26 DIAGNOSIS — M25651 Stiffness of right hip, not elsewhere classified: Secondary | ICD-10-CM

## 2022-12-26 DIAGNOSIS — R2689 Other abnormalities of gait and mobility: Secondary | ICD-10-CM

## 2022-12-26 DIAGNOSIS — M25551 Pain in right hip: Secondary | ICD-10-CM | POA: Diagnosis not present

## 2022-12-26 NOTE — Therapy (Signed)
OUTPATIENT PHYSICAL THERAPY TREATMENT   Patient Name: Carrie Barnes MRN: [id number] DOB:[birth year], 87 y.o., female Today's Date: 12/26/2022  END OF SESSION:  PT End of Session - 12/26/22 0850     Visit Number 3    Number of Visits 16    Date for PT Re-Evaluation 02/09/23    Authorization Type BCBS Medicare    Progress Note Due on Visit 10    PT Start Time [phone]    PT Stop Time 0930    PT Time Calculation (min) 43 min    Activity Tolerance Patient tolerated treatment well    Behavior During Therapy [facility] for tasks assessed/performed               Past Medical History:  Diagnosis Date   Cholecystitis    Coronary artery disease    a. 03/2017: 80-90% LAD stenosis (PCI/DES placement with a 2.75x16 mm Promus Premier stent). No significant stenosis along RCA or LCx.    DJD (degenerative joint disease)    GERD (gastroesophageal reflux disease)    Hyperlipidemia    Hypertension    dx s/p MI    Ischemic cardiomyopathy    Myocardial infarction (HCC) 03/17/2017   03-2017 treated at [facility]    Osteoporosis    Status post insertion of drug-eluting stent into left anterior descending (LAD) artery 03/2017   Past Surgical History:  Procedure Laterality Date   APPENDECTOMY     CESAREAN SECTION     CHOLECYSTECTOMY N/A 11/05/2017   Procedure: LAPAROSCOPIC CHOLECYSTECTOMY WITH INTRAOPERATIVE CHOLANGIOGRAM;  Surgeon: Darnell Level, MD;  Location: [facility];  Service: General;  Laterality: N/A;   ERCP N/A 11/11/2017   Procedure: ENDOSCOPIC RETROGRADE CHOLANGIOPANCREATOGRAPHY (ERCP);  Surgeon: Willis Modena, MD;  Location: [facility];  Service: Endoscopy;  Laterality: N/A;  possible   EUS N/A 11/11/2017   Procedure: UPPER ENDOSCOPIC ULTRASOUND (EUS) RADIAL;  Surgeon: Willis Modena, MD;  Location: [facility];  Service: Endoscopy;  Laterality: N/A;   FEMUR IM NAIL Right 07/13/2019   Procedure: INTRAMEDULLARY (IM) RETROGRADE FEMORAL NAILING;  Surgeon: Ollen Gross, MD;  Location: [facility];  Service: Orthopedics;  Laterality: Right;    LUMBAR LAMINECTOMY/ DECOMPRESSION WITH MET-RX     OPEN REDUCTION INTERNAL FIXATION ACETABULAR FRACTURE STOPPA Right 05/14/2022   Procedure: OPEN REDUCTION INTERNAL FIXATION RIGHT ACETABULUM;  Surgeon: Roby Lofts, MD;  Location: [facility] OR;  Service: Orthopedics;  Laterality: Right;   ORIF FEMUR FRACTURE Right 08/21/2022   Procedure: RIGHT OPEN REDUCTION INTERNAL FIXATION (ORIF) DISTAL FEMUR FRACTURE;  Surgeon: Roby Lofts, MD;  Location: [facility] OR;  Service: Orthopedics;  Laterality: Right;   REVERSE SHOULDER ARTHROPLASTY Right 05/15/2022   Procedure: RIGHT REVERSE SHOULDER ARTHROPLASTY;  Surgeon: Yolonda Kida, MD;  Location: [facility] OR;  Service: Orthopedics;  Laterality: Right;   SHOULDER ARTHROSCOPY WITH ROTATOR CUFF REPAIR AND SUBACROMIAL DECOMPRESSION Right 09/20/2021   Procedure: SHOULDER MINI OPEN ROTATOR CUFF REPAIR AND SUBACROMIAL DECOMPRESSION WITH POSSIBLE PATCH GRAFT;  Surgeon: Jene Every, MD;  Location: [facility];  Service: Orthopedics;  Laterality: Right;  90 MINS   stent  Left 03/2017   anterior descending ; drug eluting ; tx of MI at [facility]    TONSILLECTOMY     VAGINAL HYSTERECTOMY     vaginal sling     Patient Active Problem List   Diagnosis Date Noted   Closed bicondylar fracture of distal femur (HCC) 08/20/2022   Hyponatremia 08/20/2022   Hypomagnesemia 05/13/2022  Pelvis fracture (HCC) 05/12/2022   Humeral head fracture, right, closed, initial encounter 05/12/2022   Elevated troponin 05/12/2022   Hematoma 05/12/2022   Hypertension 11/05/2021   Complete rotator cuff tear 09/20/2021   Closed intertrochanteric fracture of right hip (HCC) 07/11/2019   Closed right hip fracture (HCC) 07/11/2019   Chronic cholecystitis due to cholelithiasis with choledocholithiasis 11/05/2017   Cholelithiasis with chronic cholecystitis 11/01/2017   Cholelithiasis 05/18/2017   CAD in  native artery 03/31/2017   Ischemic cardiomyopathy 03/31/2017   Hyperlipemia 03/31/2017   DJD (degenerative joint disease) 03/31/2017   Osteoporosis 03/31/2017    PCP: Renford Dills, MD  REFERRING PROVIDER: Dion Saucier D, PA  REFERRING DIAG: ORIF R Distal Femur FX 08/21/22 ORIF R Acetabulum FX 05/14/22  THERAPY DIAG:  Muscle weakness (generalized)  Other abnormalities of gait and mobility  Pain in right hip  Stiffness of right hip, not elsewhere classified  Rationale for Evaluation and Treatment: Rehabilitation  ONSET DATE: R hip ORIF 08/21/22  SUBJECTIVE:   SUBJECTIVE STATEMENT: Exercises have continued to go well. A little soreness.   PERTINENT HISTORY: From H&P: 87 yo female with history of CAD, ischemic cardiomyopathy, paroxysmal atrial fibrillation, HTN, HLD, PACs and spinal stenosis 3rd R hip fx, R shoulder fx  PAIN:  Are you having pain? Yes: NPRS scale: 2 currently/10 Pain location: R front thigh Pain description: dull Aggravating factors: standing/walking Relieving factors: rest  PRECAUTIONS: None  WEIGHT BEARING RESTRICTIONS: No  FALLS:  Has patient fallen in last 6 months? Yes. Number of falls 2  LIVING ENVIRONMENT: Lives with: lives alone Daughter lives close; family checks in daily Lives in: House/apartment Stairs: No Has following equipment at home: Dan Humphreys - 4 wheeled, shower chair, bed side commode, and Ramped entry  OCCUPATION: Retired  PATIENT GOALS: "I just want to get stronger and not be susceptible to losing balance"  NEXT MD VISIT: n/a  OBJECTIVE: (Measures in this section from initial evaluation unless otherwise noted)  DIAGNOSTIC FINDINGS: 08/21/22 hip x-ray demos ORIF  PATIENT SURVEYS:  FOTO 40; predicted 53   SENSATION: WFL  EDEMA:  Notes a little R posterior thigh swelling  MUSCLE LENGTH: Hamstrings: Right ~70 deg; Left ~70 deg Thomas test: Right -10 deg; Left 10 deg  POSTURE: flexed trunk   LOWER EXTREMITY  ROM:  Active/Passive ROM Right eval Left eval  Hip flexion 80/100 125/125  Hip extension    Hip abduction    Hip adduction    Hip internal rotation    Hip external rotation    Knee flexion    Knee extension    Ankle dorsiflexion    Ankle plantarflexion    Ankle inversion    Ankle eversion     (Blank rows = not tested)  LOWER EXTREMITY MMT:  MMT Right eval Left eval  Hip flexion 3-* 5  Hip extension 3- 3+  Hip abduction 2+ 3-  Hip adduction    Hip internal rotation    Hip external rotation    Knee flexion 4+ 5  Knee extension 3+ 5  Ankle dorsiflexion    Ankle plantarflexion    Ankle inversion    Ankle eversion     (Blank rows = not tested) * = concordant pain  FUNCTIONAL TESTS:  5 times sit to stand: 12.95 sec Berg Balance test: 40/56   GAIT: Distance walked: 100' Assistive device utilized: Environmental consultant - 4 wheeled Level of assistance: Modified independence Comments: Reciprocal gait, flexed trunk   TODAY'S TREATMENT:  [facility] Adult PT Treatment:                                                DATE: 12/26/22 Therapeutic Exercise: Nustep L5 x5 min, UEs/LEs Supine piriformis stretch x30 sec Supine small SLR 2x10 Supine marching 2x10 Supine hip flex + abd x10 Supine bridge x10, SL bridge x10 S/L clamshell yellow TB 2x10 Prone quad stretch x1 min Prone glute set 2x10 Prone hip ext with knee flexed x10 Standing staggered stance glute set + quad set 10x5 sec Manual Therapy: Grade II Hip mob for hip ext on R  Gait: X2 laps around ortho gym working on hip/trunk extension and quad activation during stance phase. 1 sitting break due to increasing pain/fatigue   [facility] Adult PT Treatment:                                                DATE: 12/19/22 Therapeutic Exercise: Nustep L5 x5 min, UEs/LEs Supine hip flexion eccentrics with belt support 2x10 Supine bridge 2x10 Supine SAQ 2x10 S/L clamshell 2x10 Prone quad stretch x30 sec Prone glute set with towel under  R knee for hip flexor stretch 2x10 Prone hip ext with knee flexed x10 Manual Therapy: STM & TPR R quad Neuromuscular re-ed: See balance assessments above                                                                                                                            DATE: 12/15/22 See HEP below    PATIENT EDUCATION:  Education details: Exam findings, POC, initial HEP Person educated: Patient Education method: Explanation, Demonstration, and Handouts Education comprehension: verbalized understanding, returned demonstration, and needs further education  HOME EXERCISE PROGRAM: Access Code: [email or web address] URL: [email or web address] Date: 12/15/2022 Prepared by: Vernon Prey April Kirstie Peri  Exercises - Prone Gluteal Sets  - 1 x daily - 7 x weekly - 2 sets - 30 sec hold - Prone Knee Flexion  - 1 x daily - 7 x weekly - 2 sets - 10 reps - Supine Bridge  - 1 x daily - 7 x weekly - 2 sets - 10 reps - Clamshell  - 1 x daily - 7 x weekly - 2 sets - 10 reps - Hooklying Single Knee to Chest Stretch  - 1 x daily - 7 x weekly - 2 sets - 30 sec hold  ASSESSMENT:  CLINICAL IMPRESSION: Treatment focused on progressing strengthening. Glutes are progressing well. Quads and hip flexors remain most limited. Improving hip extension ROM. Focused on improving hip/trunk extension in standing and gait.   OBJECTIVE IMPAIRMENTS: Abnormal gait, decreased activity tolerance, decreased balance, decreased endurance, decreased mobility, difficulty walking, decreased ROM, decreased strength, hypomobility,  increased fascial restrictions, impaired flexibility, improper body mechanics, postural dysfunction, and pain.   GOALS: Goals reviewed with patient? Yes  SHORT TERM GOALS: Target date: 01/12/2023  Pt will be ind with initial HEP Baseline: Goal status: INITIAL  2.  PT will assess 5x STS and set goals accordingly Baseline:  Goal status: INITIAL  3.  PT will assess Berg Balance and set  goals accordingly Baseline:  Goal status: INITIAL  4.  Pt will be able to improve R hip flexion to at least 100 deg in sitting Baseline:  Goal status: INITIAL   LONG TERM GOALS: Target date: 02/09/2023   Pt will be ind with management and progression of HEP Baseline:  Goal status: INITIAL  2.  Pt will have 5x STS of </=10 sec to demo decreased risk of falls and improved functional LE strength Baseline: 12.96 sec Goal status: INITIAL  3.  Pt will have improved Berg Balance Score by at least 8 points to demo MCID for decreased fall risk Baseline: 40/56 Goal status: INITIAL  4.  Pt will be able to perform household amb (at least 400') without a/d  Baseline:  Goal status: INITIAL  5.  Pt will be able to tolerate standing for at least 20 minutes to be able to cook/wash dishes Baseline:  Goal status: INITIAL  6.  Pt will have improved FOTO to >/=53 Baseline:  Goal status: INITIAL   PLAN:  PT FREQUENCY: 2x/week  PT DURATION: 8 weeks  PLANNED INTERVENTIONS: Therapeutic exercises, Therapeutic activity, Neuromuscular re-education, Balance training, Gait training, Patient/Family education, Self Care, Joint mobilization, Stair training, Aquatic Therapy, Cryotherapy, Moist heat, Taping, Ionotophoresis 4mg /ml Dexamethasone, Manual therapy, and Re-evaluation  PLAN FOR NEXT SESSION: Assess response to HEP and modify accordingly. Continue to work on hip ROM and strengthening.    Arnoldo Hildreth April Ma L Lumina Gitto, PT 12/26/2022, 9:19 AM

## 2022-12-29 ENCOUNTER — Ambulatory Visit: Payer: Medicare Other | Admitting: Physical Therapy

## 2022-12-29 DIAGNOSIS — M6281 Muscle weakness (generalized): Secondary | ICD-10-CM

## 2022-12-29 DIAGNOSIS — R2689 Other abnormalities of gait and mobility: Secondary | ICD-10-CM

## 2022-12-29 DIAGNOSIS — M25551 Pain in right hip: Secondary | ICD-10-CM | POA: Diagnosis not present

## 2022-12-29 DIAGNOSIS — M25651 Stiffness of right hip, not elsewhere classified: Secondary | ICD-10-CM

## 2022-12-29 NOTE — Therapy (Signed)
OUTPATIENT PHYSICAL THERAPY TREATMENT   Patient Name: Carrie Barnes MRN: [id number] DOB:01-09-1936, 87 y.o., female Today's Date: 12/29/2022  END OF SESSION:  PT End of Session - 12/29/22 1534     Visit Number 4    Number of Visits 16    Date for PT Re-Evaluation 02/09/23    Authorization Type BCBS Medicare    Progress Note Due on Visit 10    PT Start Time 1532    PT Stop Time 1615    PT Time Calculation (min) 43 min    Activity Tolerance Patient tolerated treatment well    Behavior During Therapy [facility] for tasks assessed/performed             Past Medical History:  Diagnosis Date   Cholecystitis    Coronary artery disease    a. 03/2017: 80-90% LAD stenosis (PCI/DES placement with a 2.75x16 mm Promus Premier stent). No significant stenosis along RCA or LCx.    DJD (degenerative joint disease)    GERD (gastroesophageal reflux disease)    Hyperlipidemia    Hypertension    dx s/p MI    Ischemic cardiomyopathy    Myocardial infarction (HCC) 03/17/2017   03-2017 treated at [facility]    Osteoporosis    Status post insertion of drug-eluting stent into left anterior descending (LAD) artery 03/2017   Past Surgical History:  Procedure Laterality Date   APPENDECTOMY     CESAREAN SECTION     CHOLECYSTECTOMY N/A 11/05/2017   Procedure: LAPAROSCOPIC CHOLECYSTECTOMY WITH INTRAOPERATIVE CHOLANGIOGRAM;  Surgeon: Darnell Level, MD;  Location: [facility];  Service: General;  Laterality: N/A;   ERCP N/A 11/11/2017   Procedure: ENDOSCOPIC RETROGRADE CHOLANGIOPANCREATOGRAPHY (ERCP);  Surgeon: Willis Modena, MD;  Location: [facility];  Service: Endoscopy;  Laterality: N/A;  possible   EUS N/A 11/11/2017   Procedure: UPPER ENDOSCOPIC ULTRASOUND (EUS) RADIAL;  Surgeon: Willis Modena, MD;  Location: [facility];  Service: Endoscopy;  Laterality: N/A;   FEMUR IM NAIL Right 07/13/2019   Procedure: INTRAMEDULLARY (IM) RETROGRADE FEMORAL NAILING;  Surgeon: Ollen Gross,  MD;  Location: [facility];  Service: Orthopedics;  Laterality: Right;    LUMBAR LAMINECTOMY/ DECOMPRESSION WITH MET-RX     OPEN REDUCTION INTERNAL FIXATION ACETABULAR FRACTURE STOPPA Right 05/14/2022   Procedure: OPEN REDUCTION INTERNAL FIXATION RIGHT ACETABULUM;  Surgeon: Roby Lofts, MD;  Location: [facility] OR;  Service: Orthopedics;  Laterality: Right;   ORIF FEMUR FRACTURE Right 08/21/2022   Procedure: RIGHT OPEN REDUCTION INTERNAL FIXATION (ORIF) DISTAL FEMUR FRACTURE;  Surgeon: Roby Lofts, MD;  Location: [facility] OR;  Service: Orthopedics;  Laterality: Right;   REVERSE SHOULDER ARTHROPLASTY Right 05/15/2022   Procedure: RIGHT REVERSE SHOULDER ARTHROPLASTY;  Surgeon: Yolonda Kida, MD;  Location: [facility] OR;  Service: Orthopedics;  Laterality: Right;   SHOULDER ARTHROSCOPY WITH ROTATOR CUFF REPAIR AND SUBACROMIAL DECOMPRESSION Right 09/20/2021   Procedure: SHOULDER MINI OPEN ROTATOR CUFF REPAIR AND SUBACROMIAL DECOMPRESSION WITH POSSIBLE PATCH GRAFT;  Surgeon: Jene Every, MD;  Location: [facility];  Service: Orthopedics;  Laterality: Right;  90 MINS   stent  Left 03/2017   anterior descending ; drug eluting ; tx of MI at [facility]    TONSILLECTOMY     VAGINAL HYSTERECTOMY     vaginal sling     Patient Active Problem List   Diagnosis Date Noted   Closed bicondylar fracture of distal femur (HCC) 08/20/2022   Hyponatremia 08/20/2022   Hypomagnesemia 05/13/2022   Pelvis fracture (  HCC) 05/12/2022   Humeral head fracture, right, closed, initial encounter 05/12/2022   Elevated troponin 05/12/2022   Hematoma 05/12/2022   Hypertension 11/05/2021   Complete rotator cuff tear 09/20/2021   Closed intertrochanteric fracture of right hip (HCC) 07/11/2019   Closed right hip fracture (HCC) 07/11/2019   Chronic cholecystitis due to cholelithiasis with choledocholithiasis 11/05/2017   Cholelithiasis with chronic cholecystitis 11/01/2017   Cholelithiasis 05/18/2017   CAD in native  artery 03/31/2017   Ischemic cardiomyopathy 03/31/2017   Hyperlipemia 03/31/2017   DJD (degenerative joint disease) 03/31/2017   Osteoporosis 03/31/2017    PCP: Renford Dills, MD  REFERRING PROVIDER: Dion Saucier D, PA  REFERRING DIAG: ORIF R Distal Femur FX 08/21/22 ORIF R Acetabulum FX 05/14/22  THERAPY DIAG:  Muscle weakness (generalized)  Other abnormalities of gait and mobility  Pain in right hip  Stiffness of right hip, not elsewhere classified  Rationale for Evaluation and Treatment: Rehabilitation  ONSET DATE: R hip ORIF 08/21/22  SUBJECTIVE:   SUBJECTIVE STATEMENT: Pt reports she was very sore after last session -- especially in her front thigh. Was not able to do exercises on Saturday due to soreness but was able to do them on Sunday and felt better.   PERTINENT HISTORY: From H&P: 87 yo female with history of CAD, ischemic cardiomyopathy, paroxysmal atrial fibrillation, HTN, HLD, PACs and spinal stenosis 3rd R hip fx, R shoulder fx  PAIN:  Are you having pain? Yes: NPRS scale: 2 currently/10 Pain location: R front thigh Pain description: dull Aggravating factors: standing/walking Relieving factors: rest  PRECAUTIONS: None  WEIGHT BEARING RESTRICTIONS: No  FALLS:  Has patient fallen in last 6 months? Yes. Number of falls 2  LIVING ENVIRONMENT: Lives with: lives alone Daughter lives close; family checks in daily Lives in: House/apartment Stairs: No Has following equipment at home: Dan Humphreys - 4 wheeled, shower chair, bed side commode, and Ramped entry  OCCUPATION: Retired  PATIENT GOALS: "I just want to get stronger and not be susceptible to losing balance"  NEXT MD VISIT: n/a  OBJECTIVE: (Measures in this section from initial evaluation unless otherwise noted)  DIAGNOSTIC FINDINGS: 08/21/22 hip x-ray demos ORIF  PATIENT SURVEYS:  FOTO 40; predicted 53   SENSATION: WFL  EDEMA:  Notes a little R posterior thigh swelling  MUSCLE  LENGTH: Hamstrings: Right ~70 deg; Left ~70 deg Thomas test: Right -10 deg; Left 10 deg  POSTURE: flexed trunk   LOWER EXTREMITY ROM:  Active/Passive ROM Right eval Left eval  Hip flexion 80/100 125/125  Hip extension    Hip abduction    Hip adduction    Hip internal rotation    Hip external rotation    Knee flexion    Knee extension    Ankle dorsiflexion    Ankle plantarflexion    Ankle inversion    Ankle eversion     (Blank rows = not tested)  LOWER EXTREMITY MMT:  MMT Right eval Left eval  Hip flexion 3-* 5  Hip extension 3- 3+  Hip abduction 2+ 3-  Hip adduction    Hip internal rotation    Hip external rotation    Knee flexion 4+ 5  Knee extension 3+ 5  Ankle dorsiflexion    Ankle plantarflexion    Ankle inversion    Ankle eversion     (Blank rows = not tested) * = concordant pain  FUNCTIONAL TESTS:  5 times sit to stand: 12.95 sec Berg Balance test: 40/56   GAIT: Distance walked:  100' Assistive device utilized: Environmental consultant - 4 wheeled Level of assistance: Modified independence Comments: Reciprocal gait, flexed trunk   TODAY'S TREATMENT:    [facility] Adult PT Treatment:                                                DATE: 12/29/22 Therapeutic Exercise: Nustep L4 x 5 min UEs/LEs Supine quad set x10 Supine small SLR x10 Supine marching 2x10 S/L clamshell yellow TB 2x10 Prone hip ext 2x8 on R, 2x10 on L Prone knee flexion 2x10 Standing staggered stance glute set + quad set 2x10x5 sec Heel/toe raise 2x10 Seated marching + abd 2x10 Manual Therapy: Grade II Hip mob for hip ext on R  R hip PROM flexion/ext Gait: X2 laps around ortho gym working on hip/trunk extension and quad activation during stance phase    [facility] Adult PT Treatment:                                                DATE: 12/26/22 Therapeutic Exercise: Nustep L5 x5 min, UEs/LEs Supine piriformis stretch x30 sec Supine small SLR 2x10 Supine marching 2x10 Supine hip flex + abd x10 Supine  bridge x10, SL bridge x10 S/L clamshell yellow TB 2x10 Prone quad stretch x1 min Prone glute set 2x10 Prone hip ext with knee flexed x10 Standing staggered stance glute set + quad set 10x5 sec Manual Therapy: Grade II Hip mob for hip ext on R  Gait: X2 laps around ortho gym working on hip/trunk extension and quad activation during stance phase. 1 sitting break due to increasing pain/fatigue   [facility] Adult PT Treatment:                                                DATE: 12/19/22 Therapeutic Exercise: Nustep L5 x5 min, UEs/LEs Supine hip flexion eccentrics with belt support 2x10 Supine bridge 2x10 Supine SAQ 2x10 S/L clamshell 2x10 Prone quad stretch x30 sec Prone glute set with towel under R knee for hip flexor stretch 2x10 Prone hip ext with knee flexed x10 Manual Therapy: STM & TPR R quad Neuromuscular re-ed: See balance assessments above                                                                                                                            DATE: 12/15/22 See HEP below    PATIENT EDUCATION:  Education details: Exam findings, POC, initial HEP Person educated: Patient Education method: Explanation, Demonstration, and Handouts Education comprehension: verbalized understanding, returned demonstration, and needs further education  HOME EXERCISE PROGRAM:  Access Code: [email or web address] URL: [email or web address] Date: 12/15/2022 Prepared by: Vernon Prey April Kirstie Peri  Exercises - Prone Gluteal Sets  - 1 x daily - 7 x weekly - 2 sets - 30 sec hold - Prone Knee Flexion  - 1 x daily - 7 x weekly - 2 sets - 10 reps - Supine Bridge  - 1 x daily - 7 x weekly - 2 sets - 10 reps - Clamshell  - 1 x daily - 7 x weekly - 2 sets - 10 reps - Hooklying Single Knee to Chest Stretch  - 1 x daily - 7 x weekly - 2 sets - 30 sec hold  ASSESSMENT:  CLINICAL IMPRESSION: Pt with increasing hip extension ROM. Gets very sore in quads with her exercises. Did not further  progress exercises due to pt reporting a lot of soreness after last session.   OBJECTIVE IMPAIRMENTS: Abnormal gait, decreased activity tolerance, decreased balance, decreased endurance, decreased mobility, difficulty walking, decreased ROM, decreased strength, hypomobility, increased fascial restrictions, impaired flexibility, improper body mechanics, postural dysfunction, and pain.   GOALS: Goals reviewed with patient? Yes  SHORT TERM GOALS: Target date: 01/12/2023  Pt will be ind with initial HEP Baseline: Goal status: INITIAL  2.  PT will assess 5x STS and set goals accordingly Baseline:  Goal status: INITIAL  3.  PT will assess Berg Balance and set goals accordingly Baseline:  Goal status: INITIAL  4.  Pt will be able to improve R hip flexion to at least 100 deg in sitting Baseline:  Goal status: INITIAL   LONG TERM GOALS: Target date: 02/09/2023   Pt will be ind with management and progression of HEP Baseline:  Goal status: INITIAL  2.  Pt will have 5x STS of </=10 sec to demo decreased risk of falls and improved functional LE strength Baseline: 12.96 sec Goal status: INITIAL  3.  Pt will have improved Berg Balance Score by at least 8 points to demo MCID for decreased fall risk Baseline: 40/56 Goal status: INITIAL  4.  Pt will be able to perform household amb (at least 400') without a/d  Baseline:  Goal status: INITIAL  5.  Pt will be able to tolerate standing for at least 20 minutes to be able to cook/wash dishes Baseline:  Goal status: INITIAL  6.  Pt will have improved FOTO to >/=53 Baseline:  Goal status: INITIAL   PLAN:  PT FREQUENCY: 2x/week  PT DURATION: 8 weeks  PLANNED INTERVENTIONS: Therapeutic exercises, Therapeutic activity, Neuromuscular re-education, Balance training, Gait training, Patient/Family education, Self Care, Joint mobilization, Stair training, Aquatic Therapy, Cryotherapy, Moist heat, Taping, Ionotophoresis 4mg /ml Dexamethasone,  Manual therapy, and Re-evaluation  PLAN FOR NEXT SESSION: Assess response to HEP and modify accordingly. Continue to work on hip ROM and strengthening.    Blake Goya April Ma L Lequita [place]owcroft, PT 12/29/2022, 3:35 PM

## 2023-01-01 ENCOUNTER — Other Ambulatory Visit: Payer: Self-pay

## 2023-01-01 MED ORDER — ATORVASTATIN CALCIUM 80 MG PO TABS: 80.0000 mg | ORAL_TABLET | Freq: Every day | ORAL | 3 refills | Status: DC

## 2023-01-02 ENCOUNTER — Ambulatory Visit: Payer: Medicare Other | Admitting: Physical Therapy

## 2023-01-02 DIAGNOSIS — M25651 Stiffness of right hip, not elsewhere classified: Secondary | ICD-10-CM | POA: Diagnosis not present

## 2023-01-02 DIAGNOSIS — M6281 Muscle weakness (generalized): Secondary | ICD-10-CM | POA: Diagnosis not present

## 2023-01-02 DIAGNOSIS — R2689 Other abnormalities of gait and mobility: Secondary | ICD-10-CM

## 2023-01-02 DIAGNOSIS — M25551 Pain in right hip: Secondary | ICD-10-CM | POA: Diagnosis not present

## 2023-01-02 NOTE — Therapy (Signed)
OUTPATIENT PHYSICAL THERAPY TREATMENT   Patient Name: Carrie Barnes MRN: [id number] DOB:Jun 01, 1936, 87 y.o., female Today's Date: 01/02/2023  END OF SESSION:  PT End of Session - 01/02/23 0846     Visit Number 5    Number of Visits 16    Date for PT Re-Evaluation 02/09/23    Authorization Type BCBS Medicare    Progress Note Due on Visit 10    PT Start Time [phone]    PT Stop Time 0930    PT Time Calculation (min) 44 min    Activity Tolerance Patient tolerated treatment well    Behavior During Therapy [facility] for tasks assessed/performed             Past Medical History:  Diagnosis Date   Cholecystitis    Coronary artery disease    a. 03/2017: 80-90% LAD stenosis (PCI/DES placement with a 2.75x16 mm Promus Premier stent). No significant stenosis along RCA or LCx.    DJD (degenerative joint disease)    GERD (gastroesophageal reflux disease)    Hyperlipidemia    Hypertension    dx s/p MI    Ischemic cardiomyopathy    Myocardial infarction (HCC) 03/17/2017   03-2017 treated at [facility]    Osteoporosis    Status post insertion of drug-eluting stent into left anterior descending (LAD) artery 03/2017   Past Surgical History:  Procedure Laterality Date   APPENDECTOMY     CESAREAN SECTION     CHOLECYSTECTOMY N/A 11/05/2017   Procedure: LAPAROSCOPIC CHOLECYSTECTOMY WITH INTRAOPERATIVE CHOLANGIOGRAM;  Surgeon: Darnell Level, MD;  Location: [facility];  Service: General;  Laterality: N/A;   ERCP N/A 11/11/2017   Procedure: ENDOSCOPIC RETROGRADE CHOLANGIOPANCREATOGRAPHY (ERCP);  Surgeon: Willis Modena, MD;  Location: [facility];  Service: Endoscopy;  Laterality: N/A;  possible   EUS N/A 11/11/2017   Procedure: UPPER ENDOSCOPIC ULTRASOUND (EUS) RADIAL;  Surgeon: Willis Modena, MD;  Location: [facility];  Service: Endoscopy;  Laterality: N/A;   FEMUR IM NAIL Right 07/13/2019   Procedure: INTRAMEDULLARY (IM) RETROGRADE FEMORAL NAILING;  Surgeon: Ollen Gross,  MD;  Location: [facility];  Service: Orthopedics;  Laterality: Right;    LUMBAR LAMINECTOMY/ DECOMPRESSION WITH MET-RX     OPEN REDUCTION INTERNAL FIXATION ACETABULAR FRACTURE STOPPA Right 05/14/2022   Procedure: OPEN REDUCTION INTERNAL FIXATION RIGHT ACETABULUM;  Surgeon: Roby Lofts, MD;  Location: [facility] OR;  Service: Orthopedics;  Laterality: Right;   ORIF FEMUR FRACTURE Right 08/21/2022   Procedure: RIGHT OPEN REDUCTION INTERNAL FIXATION (ORIF) DISTAL FEMUR FRACTURE;  Surgeon: Roby Lofts, MD;  Location: [facility] OR;  Service: Orthopedics;  Laterality: Right;   REVERSE SHOULDER ARTHROPLASTY Right 05/15/2022   Procedure: RIGHT REVERSE SHOULDER ARTHROPLASTY;  Surgeon: Yolonda Kida, MD;  Location: [facility] OR;  Service: Orthopedics;  Laterality: Right;   SHOULDER ARTHROSCOPY WITH ROTATOR CUFF REPAIR AND SUBACROMIAL DECOMPRESSION Right 09/20/2021   Procedure: SHOULDER MINI OPEN ROTATOR CUFF REPAIR AND SUBACROMIAL DECOMPRESSION WITH POSSIBLE PATCH GRAFT;  Surgeon: Jene Every, MD;  Location: [facility];  Service: Orthopedics;  Laterality: Right;  90 MINS   stent  Left 03/2017   anterior descending ; drug eluting ; tx of MI at [facility]    TONSILLECTOMY     VAGINAL HYSTERECTOMY     vaginal sling     Patient Active Problem List   Diagnosis Date Noted   Closed bicondylar fracture of distal femur (HCC) 08/20/2022   Hyponatremia 08/20/2022   Hypomagnesemia 05/13/2022   Pelvis fracture (  HCC) 05/12/2022   Humeral head fracture, right, closed, initial encounter 05/12/2022   Elevated troponin 05/12/2022   Hematoma 05/12/2022   Hypertension 11/05/2021   Complete rotator cuff tear 09/20/2021   Closed intertrochanteric fracture of right hip (HCC) 07/11/2019   Closed right hip fracture (HCC) 07/11/2019   Chronic cholecystitis due to cholelithiasis with choledocholithiasis 11/05/2017   Cholelithiasis with chronic cholecystitis 11/01/2017   Cholelithiasis 05/18/2017   CAD in native  artery 03/31/2017   Ischemic cardiomyopathy 03/31/2017   Hyperlipemia 03/31/2017   DJD (degenerative joint disease) 03/31/2017   Osteoporosis 03/31/2017    PCP: Renford Dills, MD  REFERRING PROVIDER: Dion Saucier D, PA  REFERRING DIAG: ORIF R Distal Femur FX 08/21/22 ORIF R Acetabulum FX 05/14/22  THERAPY DIAG:  Muscle weakness (generalized)  Other abnormalities of gait and mobility  Pain in right hip  Stiffness of right hip, not elsewhere classified  Rationale for Evaluation and Treatment: Rehabilitation  ONSET DATE: R hip ORIF 08/21/22  SUBJECTIVE:   SUBJECTIVE STATEMENT: Pt states lifting the leg is not working as well today. Pt reports she is doing better with lifting. Notes sharp pain when trying SLS at home.   PERTINENT HISTORY: From H&P: 87 yo female with history of CAD, ischemic cardiomyopathy, paroxysmal atrial fibrillation, HTN, HLD, PACs and spinal stenosis 3rd R hip fx, R shoulder fx  PAIN:  Are you having pain? Yes: NPRS scale: 2 currently/10 Pain location: R front thigh Pain description: dull Aggravating factors: standing/walking Relieving factors: rest  PRECAUTIONS: None  WEIGHT BEARING RESTRICTIONS: No  FALLS:  Has patient fallen in last 6 months? Yes. Number of falls 2  LIVING ENVIRONMENT: Lives with: lives alone Daughter lives close; family checks in daily Lives in: House/apartment Stairs: No Has following equipment at home: Dan Humphreys - 4 wheeled, shower chair, bed side commode, and Ramped entry  OCCUPATION: Retired  PATIENT GOALS: "I just want to get stronger and not be susceptible to losing balance"  NEXT MD VISIT: n/a  OBJECTIVE: (Measures in this section from initial evaluation unless otherwise noted)  DIAGNOSTIC FINDINGS: 08/21/22 hip x-ray demos ORIF  PATIENT SURVEYS:  FOTO 40; predicted 53   SENSATION: WFL  EDEMA:  Notes a little R posterior thigh swelling  MUSCLE LENGTH: Hamstrings: Right ~70 deg; Left ~70 deg Thomas  test: Right -10 deg; Left 10 deg  POSTURE: flexed trunk   LOWER EXTREMITY ROM:  Active/Passive ROM Right eval Left eval  Hip flexion 80/100 125/125  Hip extension    Hip abduction    Hip adduction    Hip internal rotation    Hip external rotation    Knee flexion    Knee extension    Ankle dorsiflexion    Ankle plantarflexion    Ankle inversion    Ankle eversion     (Blank rows = not tested)  LOWER EXTREMITY MMT:  MMT Right eval Left eval  Hip flexion 3-* 5  Hip extension 3- 3+  Hip abduction 2+ 3-  Hip adduction    Hip internal rotation    Hip external rotation    Knee flexion 4+ 5  Knee extension 3+ 5  Ankle dorsiflexion    Ankle plantarflexion    Ankle inversion    Ankle eversion     (Blank rows = not tested) * = concordant pain  FUNCTIONAL TESTS:  5 times sit to stand: 12.95 sec Berg Balance test: 40/56   GAIT: Distance walked: 100' Assistive device utilized: Environmental consultant - 4 wheeled Level of  assistance: Modified independence Comments: Reciprocal gait, flexed trunk   TODAY'S TREATMENT:    [facility] Adult PT Treatment:                                                DATE: 01/02/23 Therapeutic Exercise: Nustep L4 x 5 min UEs/LEs Supine SAQ 2# 2x10 S/L hip flexion x10 S/L clamshell 2# around knee 2x10 Prone hip flexor/quad stretch 2x30 sec Prone hip ext x10 R &L Standing tandem stance 2x30 sec Standing heel/toe raise x20 Standing side stepping at counter x2 laps with no resistance, x 2 laps with yellow TB around ankles Standing backwards walking at counter x4 laps Manual Therapy: Hip flexion joint mobilization grade II to III Hip PROM into flexion  [facility] Adult PT Treatment:                                                DATE: 12/29/22 Therapeutic Exercise: Nustep L4 x 5 min UEs/LEs Supine quad set x10 Supine small SLR x10 Supine marching 2x10 S/L clamshell yellow TB 2x10 Prone hip ext 2x8 on R, 2x10 on L Prone knee flexion 2x10 Standing staggered  stance glute set + quad set 2x10x5 sec Heel/toe raise 2x10 Seated marching + abd 2x10 Manual Therapy: Grade II Hip mob for hip ext on R  R hip PROM flexion/ext Gait: X2 laps around ortho gym working on hip/trunk extension and quad activation during stance phase    [facility] Adult PT Treatment:                                                DATE: 12/26/22 Therapeutic Exercise: Nustep L5 x5 min, UEs/LEs Supine piriformis stretch x30 sec Supine small SLR 2x10 Supine marching 2x10 Supine hip flex + abd x10 Supine bridge x10, SL bridge x10 S/L clamshell yellow TB 2x10 Prone quad stretch x1 min Prone glute set 2x10 Prone hip ext with knee flexed x10 Standing staggered stance glute set + quad set 10x5 sec Manual Therapy: Grade II Hip mob for hip ext on R  Gait: X2 laps around ortho gym working on hip/trunk extension and quad activation during stance phase. 1 sitting break due to increasing pain/fatigue     PATIENT EDUCATION:  Education details: Exam findings, POC, initial HEP Person educated: Patient Education method: Explanation, Demonstration, and Handouts Education comprehension: verbalized understanding, returned demonstration, and needs further education  HOME EXERCISE PROGRAM: Access Code: [email or web address] URL: [email or web address] Date: 12/15/2022 Prepared by: Vernon Prey April Kirstie Peri  Exercises - Prone Gluteal Sets  - 1 x daily - 7 x weekly - 2 sets - 30 sec hold - Prone Knee Flexion  - 1 x daily - 7 x weekly - 2 sets - 10 reps - Supine Bridge  - 1 x daily - 7 x weekly - 2 sets - 10 reps - Clamshell  - 1 x daily - 7 x weekly - 2 sets - 10 reps - Hooklying Single Knee to Chest Stretch  - 1 x daily - 7 x weekly - 2 sets - 30 sec hold  ASSESSMENT:  CLINICAL IMPRESSION: Progressing  pt to more standing exercises as tolerated. Still gets sharp pain when placing full weight on R LE -- tolerated tandem stance though. Working on improving hip joint mobility with flexion.    OBJECTIVE IMPAIRMENTS: Abnormal gait, decreased activity tolerance, decreased balance, decreased endurance, decreased mobility, difficulty walking, decreased ROM, decreased strength, hypomobility, increased fascial restrictions, impaired flexibility, improper body mechanics, postural dysfunction, and pain.   GOALS: Goals reviewed with patient? Yes  SHORT TERM GOALS: Target date: 01/12/2023  Pt will be ind with initial HEP Baseline: Goal status: INITIAL  2.  PT will assess 5x STS and set goals accordingly Baseline:  Goal status: INITIAL  3.  PT will assess Berg Balance and set goals accordingly Baseline:  Goal status: INITIAL  4.  Pt will be able to improve R hip flexion to at least 100 deg in sitting Baseline:  Goal status: INITIAL   LONG TERM GOALS: Target date: 02/09/2023   Pt will be ind with management and progression of HEP Baseline:  Goal status: INITIAL  2.  Pt will have 5x STS of </=10 sec to demo decreased risk of falls and improved functional LE strength Baseline: 12.96 sec Goal status: INITIAL  3.  Pt will have improved Berg Balance Score by at least 8 points to demo MCID for decreased fall risk Baseline: 40/56 Goal status: INITIAL  4.  Pt will be able to perform household amb (at least 400') without a/d  Baseline:  Goal status: INITIAL  5.  Pt will be able to tolerate standing for at least 20 minutes to be able to cook/wash dishes Baseline:  Goal status: INITIAL  6.  Pt will have improved FOTO to >/=53 Baseline:  Goal status: INITIAL   PLAN:  PT FREQUENCY: 2x/week  PT DURATION: 8 weeks  PLANNED INTERVENTIONS: Therapeutic exercises, Therapeutic activity, Neuromuscular re-education, Balance training, Gait training, Patient/Family education, Self Care, Joint mobilization, Stair training, Aquatic Therapy, Cryotherapy, Moist heat, Taping, Ionotophoresis 4mg /ml Dexamethasone, Manual therapy, and Re-evaluation  PLAN FOR NEXT SESSION: Assess response to  HEP and modify accordingly. Continue to work on hip ROM and strengthening.    Raynesha Tiedt April Ma L Abed Schar, PT 01/02/2023, 8:46 AM

## 2023-01-09 ENCOUNTER — Ambulatory Visit: Payer: Medicare Other | Admitting: Physical Therapy

## 2023-01-09 DIAGNOSIS — R2689 Other abnormalities of gait and mobility: Secondary | ICD-10-CM | POA: Diagnosis not present

## 2023-01-09 DIAGNOSIS — M25651 Stiffness of right hip, not elsewhere classified: Secondary | ICD-10-CM | POA: Diagnosis not present

## 2023-01-09 DIAGNOSIS — M6281 Muscle weakness (generalized): Secondary | ICD-10-CM | POA: Diagnosis not present

## 2023-01-09 DIAGNOSIS — M25551 Pain in right hip: Secondary | ICD-10-CM

## 2023-01-09 NOTE — Therapy (Signed)
OUTPATIENT PHYSICAL THERAPY TREATMENT   Patient Name: Carrie Barnes MRN: [id number] DOB:05/25/1936, 87 y.o., female Today's Date: 01/09/2023  END OF SESSION:  PT End of Session - 01/09/23 0849     Visit Number 6    Number of Visits 16    Date for PT Re-Evaluation 02/09/23    Authorization Type BCBS Medicare    Progress Note Due on Visit 10    PT Start Time 0850    PT Stop Time 0930    PT Time Calculation (min) 40 min    Activity Tolerance Patient tolerated treatment well    Behavior During Therapy [facility] for tasks assessed/performed             Past Medical History:  Diagnosis Date   Cholecystitis    Coronary artery disease    a. 03/2017: 80-90% LAD stenosis (PCI/DES placement with a 2.75x16 mm Promus Premier stent). No significant stenosis along RCA or LCx.    DJD (degenerative joint disease)    GERD (gastroesophageal reflux disease)    Hyperlipidemia    Hypertension    dx s/p MI    Ischemic cardiomyopathy    Myocardial infarction (HCC) 03/17/2017   03-2017 treated at [facility]    Osteoporosis    Status post insertion of drug-eluting stent into left anterior descending (LAD) artery 03/2017   Past Surgical History:  Procedure Laterality Date   APPENDECTOMY     CESAREAN SECTION     CHOLECYSTECTOMY N/A 11/05/2017   Procedure: LAPAROSCOPIC CHOLECYSTECTOMY WITH INTRAOPERATIVE CHOLANGIOGRAM;  Surgeon: Darnell Level, MD;  Location: [facility];  Service: General;  Laterality: N/A;   ERCP N/A 11/11/2017   Procedure: ENDOSCOPIC RETROGRADE CHOLANGIOPANCREATOGRAPHY (ERCP);  Surgeon: Willis Modena, MD;  Location: [facility];  Service: Endoscopy;  Laterality: N/A;  possible   EUS N/A 11/11/2017   Procedure: UPPER ENDOSCOPIC ULTRASOUND (EUS) RADIAL;  Surgeon: Willis Modena, MD;  Location: [facility];  Service: Endoscopy;  Laterality: N/A;   FEMUR IM NAIL Right 07/13/2019   Procedure: INTRAMEDULLARY (IM) RETROGRADE FEMORAL NAILING;  Surgeon: Ollen Gross,  MD;  Location: [facility];  Service: Orthopedics;  Laterality: Right;    LUMBAR LAMINECTOMY/ DECOMPRESSION WITH MET-RX     OPEN REDUCTION INTERNAL FIXATION ACETABULAR FRACTURE STOPPA Right 05/14/2022   Procedure: OPEN REDUCTION INTERNAL FIXATION RIGHT ACETABULUM;  Surgeon: Roby Lofts, MD;  Location: [facility] OR;  Service: Orthopedics;  Laterality: Right;   ORIF FEMUR FRACTURE Right 08/21/2022   Procedure: RIGHT OPEN REDUCTION INTERNAL FIXATION (ORIF) DISTAL FEMUR FRACTURE;  Surgeon: Roby Lofts, MD;  Location: [facility] OR;  Service: Orthopedics;  Laterality: Right;   REVERSE SHOULDER ARTHROPLASTY Right 05/15/2022   Procedure: RIGHT REVERSE SHOULDER ARTHROPLASTY;  Surgeon: Yolonda Kida, MD;  Location: [facility] OR;  Service: Orthopedics;  Laterality: Right;   SHOULDER ARTHROSCOPY WITH ROTATOR CUFF REPAIR AND SUBACROMIAL DECOMPRESSION Right 09/20/2021   Procedure: SHOULDER MINI OPEN ROTATOR CUFF REPAIR AND SUBACROMIAL DECOMPRESSION WITH POSSIBLE PATCH GRAFT;  Surgeon: Jene Every, MD;  Location: [facility];  Service: Orthopedics;  Laterality: Right;  90 MINS   stent  Left 03/2017   anterior descending ; drug eluting ; tx of MI at [facility]    TONSILLECTOMY     VAGINAL HYSTERECTOMY     vaginal sling     Patient Active Problem List   Diagnosis Date Noted   Closed bicondylar fracture of distal femur (HCC) 08/20/2022   Hyponatremia 08/20/2022   Hypomagnesemia 05/13/2022   Pelvis fracture (  HCC) 05/12/2022   Humeral head fracture, right, closed, initial encounter 05/12/2022   Elevated troponin 05/12/2022   Hematoma 05/12/2022   Hypertension 11/05/2021   Complete rotator cuff tear 09/20/2021   Closed intertrochanteric fracture of right hip (HCC) 07/11/2019   Closed right hip fracture (HCC) 07/11/2019   Chronic cholecystitis due to cholelithiasis with choledocholithiasis 11/05/2017   Cholelithiasis with chronic cholecystitis 11/01/2017   Cholelithiasis 05/18/2017   CAD in native  artery 03/31/2017   Ischemic cardiomyopathy 03/31/2017   Hyperlipemia 03/31/2017   DJD (degenerative joint disease) 03/31/2017   Osteoporosis 03/31/2017    PCP: Renford Dills, MD  REFERRING PROVIDER: Dion Saucier D, PA  REFERRING DIAG: ORIF R Distal Femur FX 08/21/22 ORIF R Acetabulum FX 05/14/22  THERAPY DIAG:  Muscle weakness (generalized)  Other abnormalities of gait and mobility  Pain in right hip  Stiffness of right hip, not elsewhere classified  Rationale for Evaluation and Treatment: Rehabilitation  ONSET DATE: R hip ORIF 08/21/22  SUBJECTIVE:   SUBJECTIVE STATEMENT: "I'm going to the surgeon on Monday and then have PT afterwards."  PERTINENT HISTORY: From H&P: 87 yo female with history of CAD, ischemic cardiomyopathy, paroxysmal atrial fibrillation, HTN, HLD, PACs and spinal stenosis 3rd R hip fx, R shoulder fx  PAIN:  Are you having pain? Yes: NPRS scale: 2 currently/10 Pain location: R front thigh Pain description: dull Aggravating factors: standing/walking Relieving factors: rest  PRECAUTIONS: None  WEIGHT BEARING RESTRICTIONS: No  FALLS:  Has patient fallen in last 6 months? Yes. Number of falls 2  LIVING ENVIRONMENT: Lives with: lives alone Daughter lives close; family checks in daily Lives in: House/apartment Stairs: No Has following equipment at home: Dan Humphreys - 4 wheeled, shower chair, bed side commode, and Ramped entry  OCCUPATION: Retired  PATIENT GOALS: "I just want to get stronger and not be susceptible to losing balance"  NEXT MD VISIT: n/a  OBJECTIVE: (Measures in this section from initial evaluation unless otherwise noted)  DIAGNOSTIC FINDINGS: 08/21/22 hip x-ray demos ORIF  PATIENT SURVEYS:  FOTO 40; predicted 53   SENSATION: WFL  EDEMA:  Notes a little R posterior thigh swelling  MUSCLE LENGTH: Hamstrings: Right ~70 deg; Left ~70 deg Thomas test: Right -10 deg; Left 10 deg  POSTURE: flexed trunk   LOWER EXTREMITY  ROM:  Active/Passive ROM Right eval Left eval Right 01/09/23  Hip flexion 80/100 125/125 100/125  Hip extension     Hip abduction     Hip adduction     Hip internal rotation     Hip external rotation     Knee flexion     Knee extension     Ankle dorsiflexion     Ankle plantarflexion     Ankle inversion     Ankle eversion      (Blank rows = not tested)  LOWER EXTREMITY MMT:  MMT Right eval Left eval  Hip flexion 3-* 5  Hip extension 3- 3+  Hip abduction 2+ 3-  Hip adduction    Hip internal rotation    Hip external rotation    Knee flexion 4+ 5  Knee extension 3+ 5  Ankle dorsiflexion    Ankle plantarflexion    Ankle inversion    Ankle eversion     (Blank rows = not tested) * = concordant pain  FUNCTIONAL TESTS:  5 times sit to stand: 12.95 sec Berg Balance test: 40/56   GAIT: Distance walked: 100' Assistive device utilized: Environmental consultant - 4 wheeled Level of assistance: Modified  independence Comments: Reciprocal gait, flexed trunk   TODAY'S TREATMENT:    [facility] Adult PT Treatment:                                                DATE: 01/09/23 Therapeutic Exercise: Nustep L5 x 5 min UEs/LEs S/L hip flexion 2x10 S/L hip flexion + abd x5 (trial -- limited due to decreased ROM) Supine butterfly stretch 2x30 sec Supine figure 4 stretch 2x30 sec Supine figure 4 bridge 2x10x3 sec Supine SLR 3x5 Seated figure 4 table stretch x 30 sec Standing tandem stance 2x30 sec Side stepping yellow TB x2 laps at counter Standing hip ext yellow TB 2x10 Standing hamstring curl yellow TB 2x10 Gait: Pre-gait: glute sets 2x10 Hip hinge into glute squeeze 2x10 Amb x 2 laps with SPC around ortho gym, cues to maintain trunk/hip extension   [facility] Adult PT Treatment:                                                DATE: 01/02/23 Therapeutic Exercise: Nustep L4 x 5 min UEs/LEs Supine SAQ 2# 2x10 S/L hip flexion x10 S/L clamshell 2# around knee 2x10 Prone hip flexor/quad stretch 2x30  sec Prone hip ext x10 R &L Standing tandem stance 2x30 sec Standing heel/toe raise x20 Standing side stepping at counter x2 laps with no resistance, x 2 laps with yellow TB around ankles Standing backwards walking at counter x4 laps Manual Therapy: Hip flexion joint mobilization grade II to III Hip PROM into flexion   PATIENT EDUCATION:  Education details: Exam findings, POC, initial HEP Person educated: Patient Education method: Explanation, Demonstration, and Handouts Education comprehension: verbalized understanding, returned demonstration, and needs further education  HOME EXERCISE PROGRAM: Access Code: [email or web address] URL: [email or web address] Date: 12/15/2022 Prepared by: Vernon Prey April Kirstie Peri  Exercises - Prone Gluteal Sets  - 1 x daily - 7 x weekly - 2 sets - 30 sec hold - Prone Knee Flexion  - 1 x daily - 7 x weekly - 2 sets - 10 reps - Supine Bridge  - 1 x daily - 7 x weekly - 2 sets - 10 reps - Clamshell  - 1 x daily - 7 x weekly - 2 sets - 10 reps - Hooklying Single Knee to Chest Stretch  - 1 x daily - 7 x weekly - 2 sets - 30 sec hold  ASSESSMENT:  CLINICAL IMPRESSION: Pt is demonstrating improving hip ROM in flexion and extension. Remains limited with ER/abd -- provided new stretch to address this deficit. Continued to progress strengthening and amb. Pt able to demonstrate good tolerance to ambulating with [facility] today but requires cueing to maintain trunk/hip extension.   OBJECTIVE IMPAIRMENTS: Abnormal gait, decreased activity tolerance, decreased balance, decreased endurance, decreased mobility, difficulty walking, decreased ROM, decreased strength, hypomobility, increased fascial restrictions, impaired flexibility, improper body mechanics, postural dysfunction, and pain.   GOALS: Goals reviewed with patient? Yes  SHORT TERM GOALS: Target date: 01/12/2023  Pt will be ind with initial HEP Baseline: Goal status: MET  2.  PT will assess 5x STS and  set goals accordingly Baseline:  Goal status: MET  3.  PT will assess Berg Balance and set goals accordingly Baseline:  Goal status:  MET  4.  Pt will be able to improve R hip flexion to at least 100 deg in sitting Baseline:  Goal status: IN PROGRESS   LONG TERM GOALS: Target date: 02/09/2023   Pt will be ind with management and progression of HEP Baseline:  Goal status: INITIAL  2.  Pt will have 5x STS of </=10 sec to demo decreased risk of falls and improved functional LE strength Baseline: 12.96 sec Goal status: INITIAL  3.  Pt will have improved Berg Balance Score by at least 8 points to demo MCID for decreased fall risk Baseline: 40/56 Goal status: INITIAL  4.  Pt will be able to perform household amb (at least 400') without a/d  Baseline:  Goal status: INITIAL  5.  Pt will be able to tolerate standing for at least 20 minutes to be able to cook/wash dishes Baseline:  Goal status: INITIAL  6.  Pt will have improved FOTO to >/=53 Baseline:  Goal status: INITIAL   PLAN:  PT FREQUENCY: 2x/week  PT DURATION: 8 weeks  PLANNED INTERVENTIONS: Therapeutic exercises, Therapeutic activity, Neuromuscular re-education, Balance training, Gait training, Patient/Family education, Self Care, Joint mobilization, Stair training, Aquatic Therapy, Cryotherapy, Moist heat, Taping, Ionotophoresis 4mg /ml Dexamethasone, Manual therapy, and Re-evaluation  PLAN FOR NEXT SESSION: Assess response to HEP and modify accordingly. Continue to work on hip ROM and strengthening.    Mihaela Fajardo April Ma L Turhan Chill, PT 01/09/2023, 8:50 AM

## 2023-01-12 ENCOUNTER — Ambulatory Visit: Payer: Medicare Other | Attending: Physician Assistant | Admitting: Physical Therapy

## 2023-01-12 DIAGNOSIS — M6281 Muscle weakness (generalized): Secondary | ICD-10-CM | POA: Insufficient documentation

## 2023-01-12 DIAGNOSIS — S32401D Unspecified fracture of right acetabulum, subsequent encounter for fracture with routine healing: Secondary | ICD-10-CM | POA: Diagnosis not present

## 2023-01-12 DIAGNOSIS — R252 Cramp and spasm: Secondary | ICD-10-CM | POA: Insufficient documentation

## 2023-01-12 DIAGNOSIS — M25651 Stiffness of right hip, not elsewhere classified: Secondary | ICD-10-CM | POA: Diagnosis not present

## 2023-01-12 DIAGNOSIS — R262 Difficulty in walking, not elsewhere classified: Secondary | ICD-10-CM | POA: Insufficient documentation

## 2023-01-12 DIAGNOSIS — M25551 Pain in right hip: Secondary | ICD-10-CM | POA: Diagnosis not present

## 2023-01-12 DIAGNOSIS — R2689 Other abnormalities of gait and mobility: Secondary | ICD-10-CM | POA: Insufficient documentation

## 2023-01-12 NOTE — Therapy (Signed)
OUTPATIENT PHYSICAL THERAPY TREATMENT   Patient Name: Carrie Barnes MRN: [id number] DOB:09/28/1935, 87 y.o., female Today's Date: 01/12/2023  END OF SESSION:  PT End of Session - 01/12/23 1533     Visit Number 7    Number of Visits 16    Date for PT Re-Evaluation 02/09/23    Authorization Type BCBS Medicare    Progress Note Due on Visit 10    PT Start Time 1533    PT Stop Time 1615    PT Time Calculation (min) 42 min    Activity Tolerance Patient tolerated treatment well    Behavior During Therapy [facility] for tasks assessed/performed              Past Medical History:  Diagnosis Date   Cholecystitis    Coronary artery disease    a. 03/2017: 80-90% LAD stenosis (PCI/DES placement with a 2.75x16 mm Promus Premier stent). No significant stenosis along RCA or LCx.    DJD (degenerative joint disease)    GERD (gastroesophageal reflux disease)    Hyperlipidemia    Hypertension    dx s/p MI    Ischemic cardiomyopathy    Myocardial infarction (HCC) 03/17/2017   03-2017 treated at [facility]    Osteoporosis    Status post insertion of drug-eluting stent into left anterior descending (LAD) artery 03/2017   Past Surgical History:  Procedure Laterality Date   APPENDECTOMY     CESAREAN SECTION     CHOLECYSTECTOMY N/A 11/05/2017   Procedure: LAPAROSCOPIC CHOLECYSTECTOMY WITH INTRAOPERATIVE CHOLANGIOGRAM;  Surgeon: Darnell Level, MD;  Location: [facility];  Service: General;  Laterality: N/A;   ERCP N/A 11/11/2017   Procedure: ENDOSCOPIC RETROGRADE CHOLANGIOPANCREATOGRAPHY (ERCP);  Surgeon: Willis Modena, MD;  Location: [facility];  Service: Endoscopy;  Laterality: N/A;  possible   EUS N/A 11/11/2017   Procedure: UPPER ENDOSCOPIC ULTRASOUND (EUS) RADIAL;  Surgeon: Willis Modena, MD;  Location: [facility];  Service: Endoscopy;  Laterality: N/A;   FEMUR IM NAIL Right 07/13/2019   Procedure: INTRAMEDULLARY (IM) RETROGRADE FEMORAL NAILING;  Surgeon: Ollen Gross,  MD;  Location: [facility];  Service: Orthopedics;  Laterality: Right;    LUMBAR LAMINECTOMY/ DECOMPRESSION WITH MET-RX     OPEN REDUCTION INTERNAL FIXATION ACETABULAR FRACTURE STOPPA Right 05/14/2022   Procedure: OPEN REDUCTION INTERNAL FIXATION RIGHT ACETABULUM;  Surgeon: Roby Lofts, MD;  Location: [facility] OR;  Service: Orthopedics;  Laterality: Right;   ORIF FEMUR FRACTURE Right 08/21/2022   Procedure: RIGHT OPEN REDUCTION INTERNAL FIXATION (ORIF) DISTAL FEMUR FRACTURE;  Surgeon: Roby Lofts, MD;  Location: [facility] OR;  Service: Orthopedics;  Laterality: Right;   REVERSE SHOULDER ARTHROPLASTY Right 05/15/2022   Procedure: RIGHT REVERSE SHOULDER ARTHROPLASTY;  Surgeon: Yolonda Kida, MD;  Location: [facility] OR;  Service: Orthopedics;  Laterality: Right;   SHOULDER ARTHROSCOPY WITH ROTATOR CUFF REPAIR AND SUBACROMIAL DECOMPRESSION Right 09/20/2021   Procedure: SHOULDER MINI OPEN ROTATOR CUFF REPAIR AND SUBACROMIAL DECOMPRESSION WITH POSSIBLE PATCH GRAFT;  Surgeon: Jene Every, MD;  Location: [facility];  Service: Orthopedics;  Laterality: Right;  90 MINS   stent  Left 03/2017   anterior descending ; drug eluting ; tx of MI at [facility]    TONSILLECTOMY     VAGINAL HYSTERECTOMY     vaginal sling     Patient Active Problem List   Diagnosis Date Noted   Closed bicondylar fracture of distal femur (HCC) 08/20/2022   Hyponatremia 08/20/2022   Hypomagnesemia 05/13/2022   Pelvis  fracture (HCC) 05/12/2022   Humeral head fracture, right, closed, initial encounter 05/12/2022   Elevated troponin 05/12/2022   Hematoma 05/12/2022   Hypertension 11/05/2021   Complete rotator cuff tear 09/20/2021   Closed intertrochanteric fracture of right hip (HCC) 07/11/2019   Closed right hip fracture (HCC) 07/11/2019   Chronic cholecystitis due to cholelithiasis with choledocholithiasis 11/05/2017   Cholelithiasis with chronic cholecystitis 11/01/2017   Cholelithiasis 05/18/2017   CAD in native  artery 03/31/2017   Ischemic cardiomyopathy 03/31/2017   Hyperlipemia 03/31/2017   DJD (degenerative joint disease) 03/31/2017   Osteoporosis 03/31/2017    PCP: Renford Dills, MD  REFERRING PROVIDER: Dion Saucier D, PA  REFERRING DIAG: ORIF R Distal Femur FX 08/21/22 ORIF R Acetabulum FX 05/14/22  THERAPY DIAG:  Muscle weakness (generalized)  Other abnormalities of gait and mobility  Pain in right hip  Stiffness of right hip, not elsewhere classified  Rationale for Evaluation and Treatment: Rehabilitation  ONSET DATE: R hip ORIF 08/21/22  SUBJECTIVE:   SUBJECTIVE STATEMENT: Pt reports she saw ortho who said that she's healing well. Ortho told her that the pain she's feeling is due to the plate in her leg.   PERTINENT HISTORY: From H&P: 87 yo female with history of CAD, ischemic cardiomyopathy, paroxysmal atrial fibrillation, HTN, HLD, PACs and spinal stenosis 3rd R hip fx, R shoulder fx  PAIN:  Are you having pain? Yes: NPRS scale: 2 currently/10 Pain location: R front thigh Pain description: dull Aggravating factors: standing/walking Relieving factors: rest  PRECAUTIONS: None  WEIGHT BEARING RESTRICTIONS: No  FALLS:  Has patient fallen in last 6 months? Yes. Number of falls 2  LIVING ENVIRONMENT: Lives with: lives alone Daughter lives close; family checks in daily Lives in: House/apartment Stairs: No Has following equipment at home: Dan Humphreys - 4 wheeled, shower chair, bed side commode, and Ramped entry  OCCUPATION: Retired  PATIENT GOALS: "I just want to get stronger and not be susceptible to losing balance"  NEXT MD VISIT: n/a  OBJECTIVE: (Measures in this section from initial evaluation unless otherwise noted)  DIAGNOSTIC FINDINGS: 08/21/22 hip x-ray demos ORIF  PATIENT SURVEYS:  FOTO 40; predicted 53  EDEMA:  Notes a little R posterior thigh swelling  MUSCLE LENGTH: Hamstrings: Right ~70 deg; Left ~70 deg Thomas test: Right -10 deg; Left 10  deg  POSTURE: flexed trunk   LOWER EXTREMITY ROM:  Active/Passive ROM Right eval Left eval Right 01/09/23  Hip flexion 80/100 125/125 100/125  Hip extension     Hip abduction     Hip adduction     Hip internal rotation     Hip external rotation     Knee flexion     Knee extension     Ankle dorsiflexion     Ankle plantarflexion     Ankle inversion     Ankle eversion      (Blank rows = not tested)  LOWER EXTREMITY MMT:  MMT Right eval Left eval  Hip flexion 3-* 5  Hip extension 3- 3+  Hip abduction 2+ 3-  Hip adduction    Hip internal rotation    Hip external rotation    Knee flexion 4+ 5  Knee extension 3+ 5  Ankle dorsiflexion    Ankle plantarflexion    Ankle inversion    Ankle eversion     (Blank rows = not tested) * = concordant pain  FUNCTIONAL TESTS:  5 times sit to stand: 12.95 sec Berg Balance test: 40/56   GAIT: Distance  walked: 100' Assistive device utilized: Environmental consultant - 4 wheeled Level of assistance: Modified independence Comments: Reciprocal gait, flexed trunk   TODAY'S TREATMENT:    [facility] Adult PT Treatment:                                                DATE: 01/12/23 Therapeutic Exercise: Nustep L5 x 5 min UEs/LEs Supine SLR short range 2x10 Sidelying short arm hip abd 2x10 Sidelying hip flexion 2x10 Prone press up on forearm x30 sec Prone hamstring curl red TB 2x10 Prone hip ext 2x10 Standing hip hinge into glute set 2x10 Manual Therapy: Supine hip flexion PROM Neuromuscular re-ed: Standing tandem stance 2x30 sec Standing tap forward and across 2x10 Standing tap 4" step 2x10   [facility] Adult PT Treatment:                                                DATE: 01/09/23 Therapeutic Exercise: Nustep L5 x 5 min UEs/LEs S/L hip flexion 2x10 S/L hip flexion + abd x5 (trial -- limited due to decreased ROM) Supine butterfly stretch 2x30 sec Supine figure 4 stretch 2x30 sec Supine figure 4 bridge 2x10x3 sec Supine SLR 3x5 Seated figure 4  table stretch x 30 sec Standing tandem stance 2x30 sec Side stepping yellow TB x2 laps at counter Standing hip ext yellow TB 2x10 Standing hamstring curl yellow TB 2x10 Gait: Pre-gait: glute sets 2x10 Hip hinge into glute squeeze 2x10 Amb x 2 laps with SPC around ortho gym, cues to maintain trunk/hip extension   [facility] Adult PT Treatment:                                                DATE: 01/02/23 Therapeutic Exercise: Nustep L4 x 5 min UEs/LEs Supine SAQ 2# 2x10 S/L hip flexion x10 S/L clamshell 2# around knee 2x10 Prone hip flexor/quad stretch 2x30 sec Prone hip ext x10 R &L Standing tandem stance 2x30 sec Standing heel/toe raise x20 Standing side stepping at counter x2 laps with no resistance, x 2 laps with yellow TB around ankles Standing backwards walking at counter x4 laps Manual Therapy: Hip flexion joint mobilization grade II to III Hip PROM into flexion   PATIENT EDUCATION:  Education details: Exam findings, POC, initial HEP Person educated: Patient Education method: Explanation, Demonstration, and Handouts Education comprehension: verbalized understanding, returned demonstration, and needs further education  HOME EXERCISE PROGRAM: Access Code: [email or web address] URL: [email or web address] Date: 12/15/2022 Prepared by: Vernon Prey April Kirstie Peri  Exercises - Prone Gluteal Sets  - 1 x daily - 7 x weekly - 2 sets - 30 sec hold - Prone Knee Flexion  - 1 x daily - 7 x weekly - 2 sets - 10 reps - Supine Bridge  - 1 x daily - 7 x weekly - 2 sets - 10 reps - Clamshell  - 1 x daily - 7 x weekly - 2 sets - 10 reps - Hooklying Single Knee to Chest Stretch  - 1 x daily - 7 x weekly - 2 sets - 30 sec hold  ASSESSMENT:  CLINICAL IMPRESSION: Continued to  work on hip strengthening and ROM as tolerated. Pt remains sore from previous session. Saw ortho who states she is healing well. Focused on more balance and stabilization exercises.   OBJECTIVE IMPAIRMENTS: Abnormal  gait, decreased activity tolerance, decreased balance, decreased endurance, decreased mobility, difficulty walking, decreased ROM, decreased strength, hypomobility, increased fascial restrictions, impaired flexibility, improper body mechanics, postural dysfunction, and pain.   GOALS: Goals reviewed with patient? Yes  SHORT TERM GOALS: Target date: 01/12/2023  Pt will be ind with initial HEP Baseline: Goal status: MET  2.  PT will assess 5x STS and set goals accordingly Baseline:  Goal status: MET  3.  PT will assess Berg Balance and set goals accordingly Baseline:  Goal status: MET  4.  Pt will be able to improve R hip flexion to at least 100 deg in sitting Baseline:  Goal status: IN PROGRESS   LONG TERM GOALS: Target date: 02/09/2023   Pt will be ind with management and progression of HEP Baseline:  Goal status: INITIAL  2.  Pt will have 5x STS of </=10 sec to demo decreased risk of falls and improved functional LE strength Baseline: 12.96 sec Goal status: INITIAL  3.  Pt will have improved Berg Balance Score by at least 8 points to demo MCID for decreased fall risk Baseline: 40/56 Goal status: INITIAL  4.  Pt will be able to perform household amb (at least 400') without a/d  Baseline:  Goal status: INITIAL  5.  Pt will be able to tolerate standing for at least 20 minutes to be able to cook/wash dishes Baseline:  Goal status: INITIAL  6.  Pt will have improved FOTO to >/=53 Baseline:  Goal status: INITIAL   PLAN:  PT FREQUENCY: 2x/week  PT DURATION: 8 weeks  PLANNED INTERVENTIONS: Therapeutic exercises, Therapeutic activity, Neuromuscular re-education, Balance training, Gait training, Patient/Family education, Self Care, Joint mobilization, Stair training, Aquatic Therapy, Cryotherapy, Moist heat, Taping, Ionotophoresis 4mg /ml Dexamethasone, Manual therapy, and Re-evaluation  PLAN FOR NEXT SESSION: Assess response to HEP and modify accordingly. Continue to  work on hip ROM and strengthening.    Ky Rumple April Ma L Jomarie Gellis, PT 01/12/2023, 4:16 PM

## 2023-01-16 ENCOUNTER — Ambulatory Visit: Payer: Medicare Other | Admitting: Physical Therapy

## 2023-01-16 DIAGNOSIS — R252 Cramp and spasm: Secondary | ICD-10-CM | POA: Diagnosis not present

## 2023-01-16 DIAGNOSIS — R2689 Other abnormalities of gait and mobility: Secondary | ICD-10-CM | POA: Diagnosis not present

## 2023-01-16 DIAGNOSIS — R262 Difficulty in walking, not elsewhere classified: Secondary | ICD-10-CM | POA: Diagnosis not present

## 2023-01-16 DIAGNOSIS — M25651 Stiffness of right hip, not elsewhere classified: Secondary | ICD-10-CM

## 2023-01-16 DIAGNOSIS — M6281 Muscle weakness (generalized): Secondary | ICD-10-CM

## 2023-01-16 DIAGNOSIS — M25551 Pain in right hip: Secondary | ICD-10-CM | POA: Diagnosis not present

## 2023-01-16 NOTE — Therapy (Signed)
OUTPATIENT PHYSICAL THERAPY TREATMENT   Patient Name: Carrie Barnes MRN: [id number] DOB:Dec 03, 1935, 87 y.o., female Today's Date: 01/16/2023  END OF SESSION:  PT End of Session - 01/16/23 1020     Visit Number 8    Number of Visits 16    Date for PT Re-Evaluation 02/09/23    Authorization Type BCBS Medicare    Progress Note Due on Visit 10    PT Start Time 1015    PT Stop Time 1100    PT Time Calculation (min) 45 min    Activity Tolerance Patient tolerated treatment well    Behavior During Therapy [facility] for tasks assessed/performed              Past Medical History:  Diagnosis Date   Cholecystitis    Coronary artery disease    a. 03/2017: 80-90% LAD stenosis (PCI/DES placement with a 2.75x16 mm Promus Premier stent). No significant stenosis along RCA or LCx.    DJD (degenerative joint disease)    GERD (gastroesophageal reflux disease)    Hyperlipidemia    Hypertension    dx s/p MI    Ischemic cardiomyopathy    Myocardial infarction (HCC) 03/17/2017   03-2017 treated at [facility]    Osteoporosis    Status post insertion of drug-eluting stent into left anterior descending (LAD) artery 03/2017   Past Surgical History:  Procedure Laterality Date   APPENDECTOMY     CESAREAN SECTION     CHOLECYSTECTOMY N/A 11/05/2017   Procedure: LAPAROSCOPIC CHOLECYSTECTOMY WITH INTRAOPERATIVE CHOLANGIOGRAM;  Surgeon: Darnell Level, MD;  Location: [facility];  Service: General;  Laterality: N/A;   ERCP N/A 11/11/2017   Procedure: ENDOSCOPIC RETROGRADE CHOLANGIOPANCREATOGRAPHY (ERCP);  Surgeon: Willis Modena, MD;  Location: [facility];  Service: Endoscopy;  Laterality: N/A;  possible   EUS N/A 11/11/2017   Procedure: UPPER ENDOSCOPIC ULTRASOUND (EUS) RADIAL;  Surgeon: Willis Modena, MD;  Location: [facility];  Service: Endoscopy;  Laterality: N/A;   FEMUR IM NAIL Right 07/13/2019   Procedure: INTRAMEDULLARY (IM) RETROGRADE FEMORAL NAILING;  Surgeon: Ollen Gross,  MD;  Location: [facility];  Service: Orthopedics;  Laterality: Right;    LUMBAR LAMINECTOMY/ DECOMPRESSION WITH MET-RX     OPEN REDUCTION INTERNAL FIXATION ACETABULAR FRACTURE STOPPA Right 05/14/2022   Procedure: OPEN REDUCTION INTERNAL FIXATION RIGHT ACETABULUM;  Surgeon: Roby Lofts, MD;  Location: [facility] OR;  Service: Orthopedics;  Laterality: Right;   ORIF FEMUR FRACTURE Right 08/21/2022   Procedure: RIGHT OPEN REDUCTION INTERNAL FIXATION (ORIF) DISTAL FEMUR FRACTURE;  Surgeon: Roby Lofts, MD;  Location: [facility] OR;  Service: Orthopedics;  Laterality: Right;   REVERSE SHOULDER ARTHROPLASTY Right 05/15/2022   Procedure: RIGHT REVERSE SHOULDER ARTHROPLASTY;  Surgeon: Yolonda Kida, MD;  Location: [facility] OR;  Service: Orthopedics;  Laterality: Right;   SHOULDER ARTHROSCOPY WITH ROTATOR CUFF REPAIR AND SUBACROMIAL DECOMPRESSION Right 09/20/2021   Procedure: SHOULDER MINI OPEN ROTATOR CUFF REPAIR AND SUBACROMIAL DECOMPRESSION WITH POSSIBLE PATCH GRAFT;  Surgeon: Jene Every, MD;  Location: [facility];  Service: Orthopedics;  Laterality: Right;  90 MINS   stent  Left 03/2017   anterior descending ; drug eluting ; tx of MI at [facility]    TONSILLECTOMY     VAGINAL HYSTERECTOMY     vaginal sling     Patient Active Problem List   Diagnosis Date Noted   Closed bicondylar fracture of distal femur (HCC) 08/20/2022   Hyponatremia 08/20/2022   Hypomagnesemia 05/13/2022   Pelvis  fracture (HCC) 05/12/2022   Humeral head fracture, right, closed, initial encounter 05/12/2022   Elevated troponin 05/12/2022   Hematoma 05/12/2022   Hypertension 11/05/2021   Complete rotator cuff tear 09/20/2021   Closed intertrochanteric fracture of right hip (HCC) 07/11/2019   Closed right hip fracture (HCC) 07/11/2019   Chronic cholecystitis due to cholelithiasis with choledocholithiasis 11/05/2017   Cholelithiasis with chronic cholecystitis 11/01/2017   Cholelithiasis 05/18/2017   CAD in native  artery 03/31/2017   Ischemic cardiomyopathy 03/31/2017   Hyperlipemia 03/31/2017   DJD (degenerative joint disease) 03/31/2017   Osteoporosis 03/31/2017    PCP: Renford Dills, MD  REFERRING PROVIDER: Dion Saucier D, PA  REFERRING DIAG: ORIF R Distal Femur FX 08/21/22 ORIF R Acetabulum FX 05/14/22  THERAPY DIAG:  Muscle weakness (generalized)  Other abnormalities of gait and mobility  Pain in right hip  Stiffness of right hip, not elsewhere classified  Rationale for Evaluation and Treatment: Rehabilitation  ONSET DATE: R hip ORIF 08/21/22  SUBJECTIVE:   SUBJECTIVE STATEMENT: Pt reports she saw ortho who said that she's healing well. Ortho told her that the pain she's feeling is due to the plate in her leg.   PERTINENT HISTORY: From H&P: 87 yo female with history of CAD, ischemic cardiomyopathy, paroxysmal atrial fibrillation, HTN, HLD, PACs and spinal stenosis 3rd R hip fx, R shoulder fx  PAIN:  Are you having pain? Yes: NPRS scale: 2 currently/10 Pain location: R front thigh Pain description: dull Aggravating factors: standing/walking Relieving factors: rest  PRECAUTIONS: None  WEIGHT BEARING RESTRICTIONS: No  FALLS:  Has patient fallen in last 6 months? Yes. Number of falls 2  LIVING ENVIRONMENT: Lives with: lives alone Daughter lives close; family checks in daily Lives in: House/apartment Stairs: No Has following equipment at home: Dan Humphreys - 4 wheeled, shower chair, bed side commode, and Ramped entry  OCCUPATION: Retired  PATIENT GOALS: "I just want to get stronger and not be susceptible to losing balance"  NEXT MD VISIT: n/a  OBJECTIVE: (Measures in this section from initial evaluation unless otherwise noted)  DIAGNOSTIC FINDINGS: 08/21/22 hip x-ray demos ORIF  PATIENT SURVEYS:  FOTO 40; predicted 53  EDEMA:  Notes a little R posterior thigh swelling  MUSCLE LENGTH: Hamstrings: Right ~70 deg; Left ~70 deg Thomas test: Right -10 deg; Left 10  deg  POSTURE: flexed trunk   LOWER EXTREMITY ROM:  Active/Passive ROM Right eval Left eval Right 01/09/23  Hip flexion 80/100 125/125 100/125  Hip extension     Hip abduction     Hip adduction     Hip internal rotation     Hip external rotation     Knee flexion     Knee extension     Ankle dorsiflexion     Ankle plantarflexion     Ankle inversion     Ankle eversion      (Blank rows = not tested)  LOWER EXTREMITY MMT:  MMT Right eval Left eval  Hip flexion 3-* 5  Hip extension 3- 3+  Hip abduction 2+ 3-  Hip adduction    Hip internal rotation    Hip external rotation    Knee flexion 4+ 5  Knee extension 3+ 5  Ankle dorsiflexion    Ankle plantarflexion    Ankle inversion    Ankle eversion     (Blank rows = not tested) * = concordant pain  FUNCTIONAL TESTS:  5 times sit to stand: 12.95 sec Berg Balance test: 40/56   GAIT: Distance  walked: 100' Assistive device utilized: Environmental consultant - 4 wheeled Level of assistance: Modified independence Comments: Reciprocal gait, flexed trunk   TODAY'S TREATMENT:    [facility] Adult PT Treatment:                                                DATE: 01/16/23 Therapeutic Exercise: Nustep L5 x 5 min LEs only Sitting pball flexion with lateral flexion x30 sec Sitting piriformis stretch 2 x30 sec Sitting figure 4 stretch 2 x30 sec Sitting hip IR/ER 2x10 Sitting step over small weight 2x10 "L" stretch x30 sec with lateral flexion x30 sec Standing hip abd yellow TB 2x10 Standing hip ext yellow TB 2x10 Gait: X2 laps around gym working on posture and trunk ext Manual Therapy: STM & TPR R QL and lumbar paraspinal   [facility] Adult PT Treatment:                                                DATE: 01/12/23 Therapeutic Exercise: Nustep L5 x 5 min UEs/LEs Supine SLR short range 2x10 Sidelying short arm hip abd 2x10 Sidelying hip flexion 2x10 Prone press up on forearm x30 sec Prone hamstring curl red TB 2x10 Prone hip ext 2x10 Standing hip  hinge into glute set 2x10 Manual Therapy: Supine hip flexion PROM Neuromuscular re-ed: Standing tandem stance 2x30 sec Standing tap forward and across 2x10 Standing tap 4" step 2x10   PATIENT EDUCATION:  Education details: Exam findings, POC, initial HEP Person educated: Patient Education method: Explanation, Demonstration, and Handouts Education comprehension: verbalized understanding, returned demonstration, and needs further education  HOME EXERCISE PROGRAM: Access Code: [email or web address] URL: [email or web address] Date: 12/15/2022 Prepared by: Vernon Prey April Kirstie Peri  Exercises - Prone Gluteal Sets  - 1 x daily - 7 x weekly - 2 sets - 30 sec hold - Prone Knee Flexion  - 1 x daily - 7 x weekly - 2 sets - 10 reps - Supine Bridge  - 1 x daily - 7 x weekly - 2 sets - 10 reps - Clamshell  - 1 x daily - 7 x weekly - 2 sets - 10 reps - Hooklying Single Knee to Chest Stretch  - 1 x daily - 7 x weekly - 2 sets - 30 sec hold  ASSESSMENT:  CLINICAL IMPRESSION: Pt with increased R QL tightness today -- possibly from compensating with side stepping using yellow TB. Reviewed body mechanics with exercises to reduce lumbar strain. Worked on walking/postural endurance this session. Demonstrated hip stretches and hip strengthening in seated this session.   OBJECTIVE IMPAIRMENTS: Abnormal gait, decreased activity tolerance, decreased balance, decreased endurance, decreased mobility, difficulty walking, decreased ROM, decreased strength, hypomobility, increased fascial restrictions, impaired flexibility, improper body mechanics, postural dysfunction, and pain.   GOALS: Goals reviewed with patient? Yes  SHORT TERM GOALS: Target date: 01/12/2023  Pt will be ind with initial HEP Baseline: Goal status: MET  2.  PT will assess 5x STS and set goals accordingly Baseline:  Goal status: MET  3.  PT will assess Berg Balance and set goals accordingly Baseline:  Goal status: MET  4.  Pt  will be able to improve R hip flexion to at least 100 deg in sitting Baseline:  Goal  status: IN PROGRESS   LONG TERM GOALS: Target date: 02/09/2023   Pt will be ind with management and progression of HEP Baseline:  Goal status: INITIAL  2.  Pt will have 5x STS of </=10 sec to demo decreased risk of falls and improved functional LE strength Baseline: 12.96 sec Goal status: INITIAL  3.  Pt will have improved Berg Balance Score by at least 8 points to demo MCID for decreased fall risk Baseline: 40/56 Goal status: INITIAL  4.  Pt will be able to perform household amb (at least 400') without a/d  Baseline:  Goal status: INITIAL  5.  Pt will be able to tolerate standing for at least 20 minutes to be able to cook/wash dishes Baseline:  Goal status: INITIAL  6.  Pt will have improved FOTO to >/=53 Baseline:  Goal status: INITIAL   PLAN:  PT FREQUENCY: 2x/week  PT DURATION: 8 weeks  PLANNED INTERVENTIONS: Therapeutic exercises, Therapeutic activity, Neuromuscular re-education, Balance training, Gait training, Patient/Family education, Self Care, Joint mobilization, Stair training, Aquatic Therapy, Cryotherapy, Moist heat, Taping, Ionotophoresis 4mg /ml Dexamethasone, Manual therapy, and Re-evaluation  PLAN FOR NEXT SESSION: Assess response to HEP and modify accordingly. Continue to work on hip ROM and strengthening.    Jaquelyn Sakamoto April Ma L Lyle Niblett, PT 01/16/2023, 10:21 AM

## 2023-01-20 ENCOUNTER — Ambulatory Visit: Payer: Medicare Other

## 2023-01-20 DIAGNOSIS — R262 Difficulty in walking, not elsewhere classified: Secondary | ICD-10-CM | POA: Diagnosis not present

## 2023-01-20 DIAGNOSIS — M6281 Muscle weakness (generalized): Secondary | ICD-10-CM

## 2023-01-20 DIAGNOSIS — R2689 Other abnormalities of gait and mobility: Secondary | ICD-10-CM | POA: Diagnosis not present

## 2023-01-20 DIAGNOSIS — R252 Cramp and spasm: Secondary | ICD-10-CM

## 2023-01-20 DIAGNOSIS — M25551 Pain in right hip: Secondary | ICD-10-CM

## 2023-01-20 DIAGNOSIS — M25651 Stiffness of right hip, not elsewhere classified: Secondary | ICD-10-CM

## 2023-01-20 NOTE — Therapy (Signed)
OUTPATIENT PHYSICAL THERAPY TREATMENT   Patient Name: Carrie Barnes MRN: [id number] DOB:December 07, 1935, 87 y.o., female Today's Date: 01/20/2023  END OF SESSION:  PT End of Session - 01/20/23 1614     Visit Number 9    Number of Visits 16    Date for PT Re-Evaluation 02/09/23    Authorization Type BCBS Medicare    Progress Note Due on Visit 10    PT Start Time 1615    PT Stop Time 1700    PT Time Calculation (min) 45 min    Activity Tolerance Patient tolerated treatment well    Behavior During Therapy [facility] for tasks assessed/performed              Past Medical History:  Diagnosis Date   Cholecystitis    Coronary artery disease    a. 03/2017: 80-90% LAD stenosis (PCI/DES placement with a 2.75x16 mm Promus Premier stent). No significant stenosis along RCA or LCx.    DJD (degenerative joint disease)    GERD (gastroesophageal reflux disease)    Hyperlipidemia    Hypertension    dx s/p MI    Ischemic cardiomyopathy    Myocardial infarction (HCC) 03/17/2017   03-2017 treated at [facility]    Osteoporosis    Status post insertion of drug-eluting stent into left anterior descending (LAD) artery 03/2017   Past Surgical History:  Procedure Laterality Date   APPENDECTOMY     CESAREAN SECTION     CHOLECYSTECTOMY N/A 11/05/2017   Procedure: LAPAROSCOPIC CHOLECYSTECTOMY WITH INTRAOPERATIVE CHOLANGIOGRAM;  Surgeon: Darnell Level, MD;  Location: [facility];  Service: General;  Laterality: N/A;   ERCP N/A 11/11/2017   Procedure: ENDOSCOPIC RETROGRADE CHOLANGIOPANCREATOGRAPHY (ERCP);  Surgeon: Willis Modena, MD;  Location: [facility];  Service: Endoscopy;  Laterality: N/A;  possible   EUS N/A 11/11/2017   Procedure: UPPER ENDOSCOPIC ULTRASOUND (EUS) RADIAL;  Surgeon: Willis Modena, MD;  Location: [facility];  Service: Endoscopy;  Laterality: N/A;   FEMUR IM NAIL Right 07/13/2019   Procedure: INTRAMEDULLARY (IM) RETROGRADE FEMORAL NAILING;  Surgeon: Ollen Gross,  MD;  Location: [facility];  Service: Orthopedics;  Laterality: Right;    LUMBAR LAMINECTOMY/ DECOMPRESSION WITH MET-RX     OPEN REDUCTION INTERNAL FIXATION ACETABULAR FRACTURE STOPPA Right 05/14/2022   Procedure: OPEN REDUCTION INTERNAL FIXATION RIGHT ACETABULUM;  Surgeon: Roby Lofts, MD;  Location: [facility] OR;  Service: Orthopedics;  Laterality: Right;   ORIF FEMUR FRACTURE Right 08/21/2022   Procedure: RIGHT OPEN REDUCTION INTERNAL FIXATION (ORIF) DISTAL FEMUR FRACTURE;  Surgeon: Roby Lofts, MD;  Location: [facility] OR;  Service: Orthopedics;  Laterality: Right;   REVERSE SHOULDER ARTHROPLASTY Right 05/15/2022   Procedure: RIGHT REVERSE SHOULDER ARTHROPLASTY;  Surgeon: Yolonda Kida, MD;  Location: [facility];  Service: Orthopedics;  Laterality: Right;   SHOULDER ARTHROSCOPY WITH ROTATOR CUFF REPAIR AND SUBACROMIAL DECOMPRESSION Right 09/20/2021   Procedure: SHOULDER MINI OPEN ROTATOR CUFF REPAIR AND SUBACROMIAL DECOMPRESSION WITH POSSIBLE PATCH GRAFT;  Surgeon: Jene Every, MD;  Location: [facility];  Service: Orthopedics;  Laterality: Right;  90 MINS   stent  Left 03/2017   anterior descending ; drug eluting ; tx of MI at [facility]    TONSILLECTOMY     VAGINAL HYSTERECTOMY     vaginal sling     Patient Active Problem List   Diagnosis Date Noted   Closed bicondylar fracture of distal femur (HCC) 08/20/2022   Hyponatremia 08/20/2022   Hypomagnesemia 05/13/2022   Pelvis  fracture (HCC) 05/12/2022   Humeral head fracture, right, closed, initial encounter 05/12/2022   Elevated troponin 05/12/2022   Hematoma 05/12/2022   Hypertension 11/05/2021   Complete rotator cuff tear 09/20/2021   Closed intertrochanteric fracture of right hip (HCC) 07/11/2019   Closed right hip fracture (HCC) 07/11/2019   Chronic cholecystitis due to cholelithiasis with choledocholithiasis 11/05/2017   Cholelithiasis with chronic cholecystitis 11/01/2017   Cholelithiasis 05/18/2017   CAD in native  artery 03/31/2017   Ischemic cardiomyopathy 03/31/2017   Hyperlipemia 03/31/2017   DJD (degenerative joint disease) 03/31/2017   Osteoporosis 03/31/2017    PCP: Renford Dills, MD  REFERRING PROVIDER: Dion Saucier D, PA  REFERRING DIAG: ORIF R Distal Femur FX 08/21/22 ORIF R Acetabulum FX 05/14/22  THERAPY DIAG:  Muscle weakness (generalized)  Pain in right hip  Stiffness of right hip, not elsewhere classified  Difficulty in walking, not elsewhere classified  Cramp and spasm  Rationale for Evaluation and Treatment: Rehabilitation  ONSET DATE: R hip ORIF 08/21/22  SUBJECTIVE:   SUBJECTIVE STATEMENT: Pt reports she is still struggling to get around good but is overall improving.  "It's my right knee that hurts the worst right now"   PERTINENT HISTORY: From H&P: 87 yo female with history of CAD, ischemic cardiomyopathy, paroxysmal atrial fibrillation, HTN, HLD, PACs and spinal stenosis 3rd R hip fx, R shoulder fx  PAIN:  Are you having pain? Yes: NPRS scale: 3-4/10 Pain location: R front thigh and knee Pain description: dull Aggravating factors: standing/walking Relieving factors: rest  PRECAUTIONS: None  WEIGHT BEARING RESTRICTIONS: No  FALLS:  Has patient fallen in last 6 months? Yes. Number of falls 2  LIVING ENVIRONMENT: Lives with: lives alone Daughter lives close; family checks in daily Lives in: House/apartment Stairs: No Has following equipment at home: Dan Humphreys - 4 wheeled, shower chair, bed side commode, and Ramped entry  OCCUPATION: Retired  PATIENT GOALS: "I just want to get stronger and not be susceptible to losing balance"  NEXT MD VISIT: n/a  OBJECTIVE: (Measures in this section from initial evaluation unless otherwise noted)  DIAGNOSTIC FINDINGS: 08/21/22 hip x-ray demos ORIF  PATIENT SURVEYS:  FOTO 40; predicted 53  EDEMA:  Notes a little R posterior thigh swelling  MUSCLE LENGTH: Hamstrings: Right ~70 deg; Left ~70 deg Thomas  test: Right -10 deg; Left 10 deg  POSTURE: flexed trunk   LOWER EXTREMITY ROM:  Active/Passive ROM Right eval Left eval Right 01/09/23  Hip flexion 80/100 125/125 100/125  Hip extension     Hip abduction     Hip adduction     Hip internal rotation     Hip external rotation     Knee flexion     Knee extension     Ankle dorsiflexion     Ankle plantarflexion     Ankle inversion     Ankle eversion      (Blank rows = not tested)  LOWER EXTREMITY MMT:  MMT Right eval Left eval  Hip flexion 3-* 5  Hip extension 3- 3+  Hip abduction 2+ 3-  Hip adduction    Hip internal rotation    Hip external rotation    Knee flexion 4+ 5  Knee extension 3+ 5  Ankle dorsiflexion    Ankle plantarflexion    Ankle inversion    Ankle eversion     (Blank rows = not tested) * = concordant pain  FUNCTIONAL TESTS:  5 times sit to stand: 12.95 sec Berg Balance test: 40/56  GAIT: Distance walked: 100' Assistive device utilized: Environmental consultant - 4 wheeled Level of assistance: Modified independence Comments: Reciprocal gait, flexed trunk   TODAY'S TREATMENT:    [facility] Adult PT Treatment:                                                DATE: 01/20/23 Therapeutic Exercise: Nustep L5 x 5 min LEs only Standing hamstring stretch at steps 3 x 30 sec both Standing quad/hip flexor stretch 3 x 30 sec both Seated self rolling to quad and IT band x 2 min (suggested patient do this with rolling pin at home) right thigh and IT band Sitting piriformis stretch 2 x30 sec with strap both Supine IT band stretch with strap 3 x 20 sec with strap both  [facility] Adult PT Treatment:                                                DATE: 01/16/23 Therapeutic Exercise: Nustep L5 x 5 min LEs only Sitting pball flexion with lateral flexion x30 sec Sitting piriformis stretch 2 x30 sec Sitting figure 4 stretch 2 x30 sec Sitting hip IR/ER 2x10 Sitting step over small weight 2x10 "L" stretch x30 sec with lateral flexion x30  sec Standing hip abd yellow TB 2x10 Standing hip ext yellow TB 2x10 Gait: X2 laps around gym working on posture and trunk ext Manual Therapy: STM & TPR R QL and lumbar paraspinal   [facility] Adult PT Treatment:                                                DATE: 01/12/23 Therapeutic Exercise: Nustep L5 x 5 min UEs/LEs Supine SLR short range 2x10 Sidelying short arm hip abd 2x10 Sidelying hip flexion 2x10 Prone press up on forearm x30 sec Prone hamstring curl red TB 2x10 Prone hip ext 2x10 Standing hip hinge into glute set 2x10 Manual Therapy: Supine hip flexion PROM Neuromuscular re-ed: Standing tandem stance 2x30 sec Standing tap forward and across 2x10 Standing tap 4" step 2x10   PATIENT EDUCATION:  Education details: Exam findings, POC, initial HEP Person educated: Patient Education method: Explanation, Demonstration, and Handouts Education comprehension: verbalized understanding, returned demonstration, and needs further education  HOME EXERCISE PROGRAM: Access Code: [email or web address] URL: [email or web address] Date: 12/15/2022 Prepared by: Vernon Prey April Kirstie Peri  Exercises - Prone Gluteal Sets  - 1 x daily - 7 x weekly - 2 sets - 30 sec hold - Prone Knee Flexion  - 1 x daily - 7 x weekly - 2 sets - 10 reps - Supine Bridge  - 1 x daily - 7 x weekly - 2 sets - 10 reps - Clamshell  - 1 x daily - 7 x weekly - 2 sets - 10 reps - Hooklying Single Knee to Chest Stretch  - 1 x daily - 7 x weekly - 2 sets - 30 sec hold  ASSESSMENT:  CLINICAL IMPRESSION: Chianne is making slow but steady progress.  She appears to be having more IT band issues resulting in knee pain since her last visit.  We worked diligently  on stretching today.  We added in standing quad/hip flexor stretches as she tends to get a fair amount of thigh pain.  She was able to tolerate all activities today with minimal pain.  We will assess her response to adding these next visit and progress as indicated.   She would benefit from continuing skilled PT for LE flexibility and hip stability training.    OBJECTIVE IMPAIRMENTS: Abnormal gait, decreased activity tolerance, decreased balance, decreased endurance, decreased mobility, difficulty walking, decreased ROM, decreased strength, hypomobility, increased fascial restrictions, impaired flexibility, improper body mechanics, postural dysfunction, and pain.   GOALS: Goals reviewed with patient? Yes  SHORT TERM GOALS: Target date: 01/12/2023  Pt will be ind with initial HEP Baseline: Goal status: MET  2.  PT will assess 5x STS and set goals accordingly Baseline:  Goal status: MET  3.  PT will assess Berg Balance and set goals accordingly Baseline:  Goal status: MET  4.  Pt will be able to improve R hip flexion to at least 100 deg in sitting Baseline:  Goal status: IN PROGRESS   LONG TERM GOALS: Target date: 02/09/2023   Pt will be ind with management and progression of HEP Baseline:  Goal status: INITIAL  2.  Pt will have 5x STS of </=10 sec to demo decreased risk of falls and improved functional LE strength Baseline: 12.96 sec Goal status: INITIAL  3.  Pt will have improved Berg Balance Score by at least 8 points to demo MCID for decreased fall risk Baseline: 40/56 Goal status: INITIAL  4.  Pt will be able to perform household amb (at least 400') without a/d  Baseline:  Goal status: INITIAL  5.  Pt will be able to tolerate standing for at least 20 minutes to be able to cook/wash dishes Baseline:  Goal status: INITIAL  6.  Pt will have improved FOTO to >/=53 Baseline:  Goal status: INITIAL   PLAN:  PT FREQUENCY: 2x/week  PT DURATION: 8 weeks  PLANNED INTERVENTIONS: Therapeutic exercises, Therapeutic activity, Neuromuscular re-education, Balance training, Gait training, Patient/Family education, Self Care, Joint mobilization, Stair training, Aquatic Therapy, Cryotherapy, Moist heat, Taping, Ionotophoresis 4mg /ml  Dexamethasone, Manual therapy, and Re-evaluation  PLAN FOR NEXT SESSION:  Continue to work on hip ROM and strengthening.  Emphasize flexibility.     Victorino Dike B. Anysha Frappier, PT 01/20/23 5:08 PM [facility] [address] [place], [place] [address] Phone # [phone] Fax [phone]

## 2023-01-21 ENCOUNTER — Telehealth: Payer: Self-pay | Admitting: Cardiovascular Disease

## 2023-01-21 MED ORDER — APIXABAN 5 MG PO TABS: 5.0000 mg | ORAL_TABLET | Freq: Two times a day (BID) | ORAL | 1 refills | Status: DC

## 2023-01-21 NOTE — Telephone Encounter (Signed)
Prescription refill request for Eliquis received. Indication: AF Last office visit: 11/17/22  Alyson Ingles MD Scr: 0.66 on 10/28/22  Epic Age: 87 Weight: 68.6kg  Based on above findings Eliquis 5mg  twice daily is the appropriate dose.  Refill approved.

## 2023-01-21 NOTE — Telephone Encounter (Signed)
*  STAT* If patient is at the pharmacy, call can be transferred to refill team.   1. Which medications need to be refilled? (please list name of each medication and dose if known)   apixaban (ELIQUIS) 5 MG TABS tablet    2. Which pharmacy/location (including street and city if local pharmacy) is medication to be sent to? [id number] - [place], [place] - [address]   3. Do they need a 30 day or 90 day supply? 90  

## 2023-01-23 ENCOUNTER — Ambulatory Visit: Payer: Medicare Other | Admitting: Physical Therapy

## 2023-01-23 DIAGNOSIS — R262 Difficulty in walking, not elsewhere classified: Secondary | ICD-10-CM

## 2023-01-23 DIAGNOSIS — R2689 Other abnormalities of gait and mobility: Secondary | ICD-10-CM

## 2023-01-23 DIAGNOSIS — M25551 Pain in right hip: Secondary | ICD-10-CM

## 2023-01-23 DIAGNOSIS — R252 Cramp and spasm: Secondary | ICD-10-CM

## 2023-01-23 DIAGNOSIS — M25651 Stiffness of right hip, not elsewhere classified: Secondary | ICD-10-CM | POA: Diagnosis not present

## 2023-01-23 DIAGNOSIS — M6281 Muscle weakness (generalized): Secondary | ICD-10-CM | POA: Diagnosis not present

## 2023-01-23 NOTE — Therapy (Signed)
OUTPATIENT PHYSICAL THERAPY TREATMENT   Patient Name: Carrie Barnes MRN: [id number] DOB:May 21, 1936, 87 y.o., female Today's Date: 01/23/2023  END OF SESSION:  PT End of Session - 01/23/23 1112     Visit Number 10    Number of Visits 16    Date for PT Re-Evaluation 02/09/23    Authorization Type BCBS Medicare    Authorization - Visit Number 10    Progress Note Due on Visit 10    PT Start Time 1105    PT Stop Time 1145    PT Time Calculation (min) 40 min    Activity Tolerance Patient tolerated treatment well    Behavior During Therapy [facility] for tasks assessed/performed              Past Medical History:  Diagnosis Date   Cholecystitis    Coronary artery disease    a. 03/2017: 80-90% LAD stenosis (PCI/DES placement with a 2.75x16 mm Promus Premier stent). No significant stenosis along RCA or LCx.    DJD (degenerative joint disease)    GERD (gastroesophageal reflux disease)    Hyperlipidemia    Hypertension    dx s/p MI    Ischemic cardiomyopathy    Myocardial infarction (HCC) 03/17/2017   03-2017 treated at [facility]    Osteoporosis    Status post insertion of drug-eluting stent into left anterior descending (LAD) artery 03/2017   Past Surgical History:  Procedure Laterality Date   APPENDECTOMY     CESAREAN SECTION     CHOLECYSTECTOMY N/A 11/05/2017   Procedure: LAPAROSCOPIC CHOLECYSTECTOMY WITH INTRAOPERATIVE CHOLANGIOGRAM;  Surgeon: Darnell Level, MD;  Location: [facility];  Service: General;  Laterality: N/A;   ERCP N/A 11/11/2017   Procedure: ENDOSCOPIC RETROGRADE CHOLANGIOPANCREATOGRAPHY (ERCP);  Surgeon: Willis Modena, MD;  Location: [facility];  Service: Endoscopy;  Laterality: N/A;  possible   EUS N/A 11/11/2017   Procedure: UPPER ENDOSCOPIC ULTRASOUND (EUS) RADIAL;  Surgeon: Willis Modena, MD;  Location: [facility];  Service: Endoscopy;  Laterality: N/A;   FEMUR IM NAIL Right 07/13/2019   Procedure: INTRAMEDULLARY (IM) RETROGRADE  FEMORAL NAILING;  Surgeon: Ollen Gross, MD;  Location: [facility];  Service: Orthopedics;  Laterality: Right;    LUMBAR LAMINECTOMY/ DECOMPRESSION WITH MET-RX     OPEN REDUCTION INTERNAL FIXATION ACETABULAR FRACTURE STOPPA Right 05/14/2022   Procedure: OPEN REDUCTION INTERNAL FIXATION RIGHT ACETABULUM;  Surgeon: Roby Lofts, MD;  Location: [facility];  Service: Orthopedics;  Laterality: Right;   ORIF FEMUR FRACTURE Right 08/21/2022   Procedure: RIGHT OPEN REDUCTION INTERNAL FIXATION (ORIF) DISTAL FEMUR FRACTURE;  Surgeon: Roby Lofts, MD;  Location: [facility];  Service: Orthopedics;  Laterality: Right;   REVERSE SHOULDER ARTHROPLASTY Right 05/15/2022   Procedure: RIGHT REVERSE SHOULDER ARTHROPLASTY;  Surgeon: Yolonda Kida, MD;  [facility];  Service: Orthopedics;  Laterality: Right;   SHOULDER ARTHROSCOPY WITH ROTATOR CUFF REPAIR AND SUBACROMIAL DECOMPRESSION Right 09/20/2021   Procedure: SHOULDER MINI OPEN ROTATOR CUFF REPAIR AND SUBACROMIAL DECOMPRESSION WITH POSSIBLE PATCH GRAFT;  Surgeon: Jene Every, MD;  Location: [facility];  Service: Orthopedics;  Laterality: Right;  90 MINS   stent  Left 03/2017   anterior descending ; drug eluting ; tx of MI at [facility]    TONSILLECTOMY     VAGINAL HYSTERECTOMY     vaginal sling     Patient Active Problem List   Diagnosis Date Noted   Closed bicondylar fracture of distal femur (HCC) 08/20/2022   Hyponatremia  08/20/2022   Hypomagnesemia 05/13/2022   Pelvis fracture (HCC) 05/12/2022   Humeral head fracture, right, closed, initial encounter 05/12/2022   Elevated troponin 05/12/2022   Hematoma 05/12/2022   Hypertension 11/05/2021   Complete rotator cuff tear 09/20/2021   Closed intertrochanteric fracture of right hip (HCC) 07/11/2019   Closed right hip fracture (HCC) 07/11/2019   Chronic cholecystitis due to cholelithiasis with choledocholithiasis 11/05/2017   Cholelithiasis with chronic cholecystitis 11/01/2017    Cholelithiasis 05/18/2017   CAD in native artery 03/31/2017   Ischemic cardiomyopathy 03/31/2017   Hyperlipemia 03/31/2017   DJD (degenerative joint disease) 03/31/2017   Osteoporosis 03/31/2017    PCP: Renford Dills, MD  REFERRING PROVIDER: Dion Saucier D, PA  REFERRING DIAG: ORIF R Distal Femur FX 08/21/22 ORIF R Acetabulum FX 05/14/22  THERAPY DIAG:  Muscle weakness (generalized)  Pain in right hip  Stiffness of right hip, not elsewhere classified  Difficulty in walking, not elsewhere classified  Cramp and spasm  Other abnormalities of gait and mobility  Rationale for Evaluation and Treatment: Rehabilitation  ONSET DATE: R hip ORIF 08/21/22  SUBJECTIVE:   SUBJECTIVE STATEMENT: Pt reports nothing new or different. Notes some increased shoulder pain when trying to label envelopes. Reports less pain in her back and lateral leg today.  PERTINENT HISTORY: From H&P: 87 yo female with history of CAD, ischemic cardiomyopathy, paroxysmal atrial fibrillation, HTN, HLD, PACs and spinal stenosis 3rd R hip fx, R shoulder fx  PAIN:  Are you having pain? Yes: NPRS scale: 3-4/10 Pain location: R front thigh and knee Pain description: dull Aggravating factors: standing/walking Relieving factors: rest  PRECAUTIONS: None  WEIGHT BEARING RESTRICTIONS: No  FALLS:  Has patient fallen in last 6 months? Yes. Number of falls 2  LIVING ENVIRONMENT: Lives with: lives alone Daughter lives close; family checks in daily Lives in: House/apartment Stairs: No Has following equipment at home: Dan Humphreys - 4 wheeled, shower chair, bed side commode, and Ramped entry  OCCUPATION: Retired  PATIENT GOALS: "I just want to get stronger and not be susceptible to losing balance"  NEXT MD VISIT: n/a  OBJECTIVE: (Measures in this section from initial evaluation unless otherwise noted)  DIAGNOSTIC FINDINGS: 08/21/22 hip x-ray demos ORIF  PATIENT SURVEYS:  FOTO 40; predicted 53  EDEMA:   Notes a little R posterior thigh swelling  MUSCLE LENGTH: Hamstrings: Right ~70 deg; Left ~70 deg Thomas test: Right -10 deg; Left 10 deg  POSTURE: flexed trunk   LOWER EXTREMITY ROM:  Active/Passive ROM Right eval Left eval Right 01/09/23  Hip flexion 80/100 125/125 100/125  Hip extension     Hip abduction     Hip adduction     Hip internal rotation     Hip external rotation     Knee flexion     Knee extension     Ankle dorsiflexion     Ankle plantarflexion     Ankle inversion     Ankle eversion      (Blank rows = not tested)  LOWER EXTREMITY MMT:  MMT Right eval Left eval  Hip flexion 3-* 5  Hip extension 3- 3+  Hip abduction 2+ 3-  Hip adduction    Hip internal rotation    Hip external rotation    Knee flexion 4+ 5  Knee extension 3+ 5  Ankle dorsiflexion    Ankle plantarflexion    Ankle inversion    Ankle eversion     (Blank rows = not tested) * = concordant pain  FUNCTIONAL TESTS:  5 times sit to stand: 12.95 sec Berg Balance test: 40/56  01/23/23 5 times sit<>stand: 10 sec Berg Balance test: 47/56  GAIT: Distance walked: 100' Assistive device utilized: Environmental consultant - 4 wheeled Level of assistance: Modified independence Comments: Reciprocal gait, flexed trunk   TODAY'S TREATMENT:    [facility] Adult PT Treatment:                                                DATE: 01/23/23 Therapeutic Exercise: L foot on 4" step working on weight shift/weightbearing R LE 3x10 sec Forward step on 4" step, diagonals with 2# weighted ball 2x10 Side step on 4" step 2# weighted ball pushed fwd/bwd 2x10 Seated hip flexion 2x10 Manual Therapy: R hip mobilization for flexion in seated 2x10 Therapeutic Activity: Rechecking goals   [facility] Adult PT Treatment:                                                DATE: 01/20/23 Therapeutic Exercise: Nustep L5 x 5 min LEs only Standing hamstring stretch at steps 3 x 30 sec both Standing quad/hip flexor stretch 3 x 30 sec both Seated  self rolling to quad and IT band x 2 min (suggested patient do this with rolling pin at home) right thigh and IT band Sitting piriformis stretch 2 x30 sec with strap both Supine IT band stretch with strap 3 x 20 sec with strap both  [facility] Adult PT Treatment:                                                DATE: 01/16/23 Therapeutic Exercise: Nustep L5 x 5 min LEs only Sitting pball flexion with lateral flexion x30 sec Sitting piriformis stretch 2 x30 sec Sitting figure 4 stretch 2 x30 sec Sitting hip IR/ER 2x10 Sitting step over small weight 2x10 "L" stretch x30 sec with lateral flexion x30 sec Standing hip abd yellow TB 2x10 Standing hip ext yellow TB 2x10 Gait: X2 laps around gym working on posture and trunk ext Manual Therapy: STM & TPR R QL and lumbar paraspinal   [facility] Adult PT Treatment:                                                DATE: 01/12/23 Therapeutic Exercise: Nustep L5 x 5 min UEs/LEs Supine SLR short range 2x10 Sidelying short arm hip abd 2x10 Sidelying hip flexion 2x10 Prone press up on forearm x30 sec Prone hamstring curl red TB 2x10 Prone hip ext 2x10 Standing hip hinge into glute set 2x10 Manual Therapy: Supine hip flexion PROM Neuromuscular re-ed: Standing tandem stance 2x30 sec Standing tap forward and across 2x10 Standing tap 4" step 2x10   PATIENT EDUCATION:  Education details: Exam findings, POC, initial HEP Person educated: Patient Education method: Explanation, Demonstration, and Handouts Education comprehension: verbalized understanding, returned demonstration, and needs further education  HOME EXERCISE PROGRAM: Access Code: [email or web address] URL: [email or web address] Date: 12/15/2022 Prepared by: Vernon Prey  April Marie Verbena Boeding  Exercises - Prone Gluteal Sets  - 1 x daily - 7 x weekly - 2 sets - 30 sec hold - Prone Knee Flexion  - 1 x daily - 7 x weekly - 2 sets - 10 reps - Supine Bridge  - 1 x daily - 7 x weekly - 2 sets - 10 reps -  Clamshell  - 1 x daily - 7 x weekly - 2 sets - 10 reps - Hooklying Single Knee to Chest Stretch  - 1 x daily - 7 x weekly - 2 sets - 30 sec hold  ASSESSMENT:  CLINICAL IMPRESSION: Less ITB pain today. Checked pt's progress towards her goals -- she has met her 5x STS goal and is on her way to meeting her Sharlene Motts Balance goal. Strength and balance continue to be progressed. Full weight on R LE remains painful but pt is now tolerating around 50-75% of her body weight.   OBJECTIVE IMPAIRMENTS: Abnormal gait, decreased activity tolerance, decreased balance, decreased endurance, decreased mobility, difficulty walking, decreased ROM, decreased strength, hypomobility, increased fascial restrictions, impaired flexibility, improper body mechanics, postural dysfunction, and pain.   GOALS: Goals reviewed with patient? Yes  SHORT TERM GOALS: Target date: 01/12/2023  Pt will be ind with initial HEP Baseline: Goal status: MET  2.  PT will assess 5x STS and set goals accordingly Baseline:  Goal status: MET  3.  PT will assess Berg Balance and set goals accordingly Baseline:  Goal status: MET  4.  Pt will be able to improve R hip flexion to at least 100 deg in sitting Baseline:  01/23/23: 100 deg AROM after stretching Goal status: MET   LONG TERM GOALS: Target date: 02/09/2023   Pt will be ind with management and progression of HEP Baseline:  Goal status: IN PROGRESS  2.  Pt will have 5x STS of </=10 sec to demo decreased risk of falls and improved functional LE strength Baseline: 12.96 sec 01/23/23: 10 sec Goal status: MET  3.  Pt will have improved Berg Balance Score by at least 8 points to demo MCID for decreased fall risk Baseline: 40/56 01/23/23: 47/56 Goal status: IN PROGRESS  4.  Pt will be able to perform household amb (at least 400') without a/d  Baseline:  Goal status: INITIAL  5.  Pt will be able to tolerate standing for at least 20 minutes to be able to cook/wash  dishes Baseline:  Goal status: INITIAL  6.  Pt will have improved FOTO to >/=53 Baseline:  Goal status: INITIAL   PLAN:  PT FREQUENCY: 2x/week  PT DURATION: 8 weeks  PLANNED INTERVENTIONS: Therapeutic exercises, Therapeutic activity, Neuromuscular re-education, Balance training, Gait training, Patient/Family education, Self Care, Joint mobilization, Stair training, Aquatic Therapy, Cryotherapy, Moist heat, Taping, Ionotophoresis 4mg /ml Dexamethasone, Manual therapy, and Re-evaluation  PLAN FOR NEXT SESSION:  Continue to work on hip ROM and strengthening.  Emphasize flexibility.  Work on balance and weight shifting.    [facility] [address] [place], PT 01/23/23 11:13 AM [facility] [address] [place], [place] [address] Phone # [phone] Fax [phone]

## 2023-01-26 ENCOUNTER — Encounter: Payer: Medicare Other | Admitting: Physical Therapy

## 2023-01-30 ENCOUNTER — Ambulatory Visit: Payer: Medicare Other | Admitting: Physical Therapy

## 2023-02-09 ENCOUNTER — Ambulatory Visit: Payer: Medicare Other | Attending: Physician Assistant | Admitting: Physical Therapy

## 2023-02-09 DIAGNOSIS — R2689 Other abnormalities of gait and mobility: Secondary | ICD-10-CM | POA: Diagnosis not present

## 2023-02-09 DIAGNOSIS — R252 Cramp and spasm: Secondary | ICD-10-CM | POA: Insufficient documentation

## 2023-02-09 DIAGNOSIS — M25551 Pain in right hip: Secondary | ICD-10-CM | POA: Diagnosis not present

## 2023-02-09 DIAGNOSIS — M25651 Stiffness of right hip, not elsewhere classified: Secondary | ICD-10-CM | POA: Diagnosis not present

## 2023-02-09 DIAGNOSIS — M6281 Muscle weakness (generalized): Secondary | ICD-10-CM | POA: Diagnosis not present

## 2023-02-09 DIAGNOSIS — R262 Difficulty in walking, not elsewhere classified: Secondary | ICD-10-CM | POA: Insufficient documentation

## 2023-02-09 NOTE — Therapy (Signed)
OUTPATIENT PHYSICAL THERAPY TREATMENT   Patient Name: Carrie Barnes MRN: [id number] DOB:04/02/1936, 87 y.o., female Today's Date: 02/09/2023  END OF SESSION:  PT End of Session - 02/09/23 1102     Visit Number 11    Number of Visits 16    Date for PT Re-Evaluation 02/09/23    Authorization Type BCBS Medicare    Authorization - Visit Number 11    Progress Note Due on Visit 20    PT Start Time 1105    PT Stop Time 1145    PT Time Calculation (min) 40 min    Activity Tolerance Patient tolerated treatment well    Behavior During Therapy [facility] for tasks assessed/performed              Past Medical History:  Diagnosis Date   Cholecystitis    Coronary artery disease    a. 03/2017: 80-90% LAD stenosis (PCI/DES placement with a 2.75x16 mm Promus Premier stent). No significant stenosis along RCA or LCx.    DJD (degenerative joint disease)    GERD (gastroesophageal reflux disease)    Hyperlipidemia    Hypertension    dx s/p MI    Ischemic cardiomyopathy    Myocardial infarction (HCC) 03/17/2017   03-2017 treated at [facility]    Osteoporosis    Status post insertion of drug-eluting stent into left anterior descending (LAD) artery 03/2017   Past Surgical History:  Procedure Laterality Date   APPENDECTOMY     CESAREAN SECTION     CHOLECYSTECTOMY N/A 11/05/2017   Procedure: LAPAROSCOPIC CHOLECYSTECTOMY WITH INTRAOPERATIVE CHOLANGIOGRAM;  Surgeon: Darnell Level, MD;  Location: [facility];  Service: General;  Laterality: N/A;   ERCP N/A 11/11/2017   Procedure: ENDOSCOPIC RETROGRADE CHOLANGIOPANCREATOGRAPHY (ERCP);  Surgeon: Willis Modena, MD;  Location: [facility];  Service: Endoscopy;  Laterality: N/A;  possible   EUS N/A 11/11/2017   Procedure: UPPER ENDOSCOPIC ULTRASOUND (EUS) RADIAL;  Surgeon: Willis Modena, MD;  Location: [facility];  Service: Endoscopy;  Laterality: N/A;   FEMUR IM NAIL Right 07/13/2019   Procedure: INTRAMEDULLARY (IM) RETROGRADE FEMORAL  NAILING;  Surgeon: Ollen Gross, MD;  Location: [facility];  Service: Orthopedics;  Laterality: Right;    LUMBAR LAMINECTOMY/ DECOMPRESSION WITH MET-RX     OPEN REDUCTION INTERNAL FIXATION ACETABULAR FRACTURE STOPPA Right 05/14/2022   Procedure: OPEN REDUCTION INTERNAL FIXATION RIGHT ACETABULUM;  Surgeon: Roby Lofts, MD;  Location: [facility] OR;  Service: Orthopedics;  Laterality: Right;   ORIF FEMUR FRACTURE Right 08/21/2022   Procedure: RIGHT OPEN REDUCTION INTERNAL FIXATION (ORIF) DISTAL FEMUR FRACTURE;  Surgeon: Roby Lofts, MD;  Location: [facility] OR;  Service: Orthopedics;  Laterality: Right;   REVERSE SHOULDER ARTHROPLASTY Right 05/15/2022   Procedure: RIGHT REVERSE SHOULDER ARTHROPLASTY;  Surgeon: Yolonda Kida, MD;  Location: [facility] OR;  Service: Orthopedics;  Laterality: Right;   SHOULDER ARTHROSCOPY WITH ROTATOR CUFF REPAIR AND SUBACROMIAL DECOMPRESSION Right 09/20/2021   Procedure: SHOULDER MINI OPEN ROTATOR CUFF REPAIR AND SUBACROMIAL DECOMPRESSION WITH POSSIBLE PATCH GRAFT;  Surgeon: Jene Every, MD;  Location: [facility];  Service: Orthopedics;  Laterality: Right;  90 MINS   stent  Left 03/2017   anterior descending ; drug eluting ; tx of MI at [facility]    TONSILLECTOMY     VAGINAL HYSTERECTOMY     vaginal sling     Patient Active Problem List   Diagnosis Date Noted   Closed bicondylar fracture of distal femur (HCC) 08/20/2022   Hyponatremia  08/20/2022   Hypomagnesemia 05/13/2022   Pelvis fracture (HCC) 05/12/2022   Humeral head fracture, right, closed, initial encounter 05/12/2022   Elevated troponin 05/12/2022   Hematoma 05/12/2022   Hypertension 11/05/2021   Complete rotator cuff tear 09/20/2021   Closed intertrochanteric fracture of right hip (HCC) 07/11/2019   Closed right hip fracture (HCC) 07/11/2019   Chronic cholecystitis due to cholelithiasis with choledocholithiasis 11/05/2017   Cholelithiasis with chronic cholecystitis 11/01/2017    Cholelithiasis 05/18/2017   CAD in native artery 03/31/2017   Ischemic cardiomyopathy 03/31/2017   Hyperlipemia 03/31/2017   DJD (degenerative joint disease) 03/31/2017   Osteoporosis 03/31/2017    PCP: Renford Dills, MD  REFERRING PROVIDER: Dion Saucier D, PA  REFERRING DIAG: ORIF R Distal Femur FX 08/21/22 ORIF R Acetabulum FX 05/14/22  THERAPY DIAG:  Muscle weakness (generalized)  Pain in right hip  Stiffness of right hip, not elsewhere classified  Difficulty in walking, not elsewhere classified  Cramp and spasm  Other abnormalities of gait and mobility  Rationale for Evaluation and Treatment: Rehabilitation  ONSET DATE: R hip ORIF 08/21/22  SUBJECTIVE:   SUBJECTIVE STATEMENT: Pt states vacation went well. Reports R hip remains tight. Pt states she was able to keep up with her stretches but not as much with her resistance exercises. Reports she has had difficulty doing the band exercises. Has been walking 10 min twice day.   PERTINENT HISTORY: From H&P: 88 yo female with history of CAD, ischemic cardiomyopathy, paroxysmal atrial fibrillation, HTN, HLD, PACs and spinal stenosis 3rd R hip fx, R shoulder fx  PAIN:  Are you having pain? Yes: NPRS scale: 3-4/10 Pain location: R front thigh and knee Pain description: dull Aggravating factors: standing/walking Relieving factors: rest  PRECAUTIONS: None  WEIGHT BEARING RESTRICTIONS: No  FALLS:  Has patient fallen in last 6 months? Yes. Number of falls 2  LIVING ENVIRONMENT: Lives with: lives alone Daughter lives close; family checks in daily Lives in: House/apartment Stairs: No Has following equipment at home: Dan Humphreys - 4 wheeled, shower chair, bed side commode, and Ramped entry  OCCUPATION: Retired  PATIENT GOALS: "I just want to get stronger and not be susceptible to losing balance"  NEXT MD VISIT: n/a  OBJECTIVE: (Measures in this section from initial evaluation unless otherwise noted)  DIAGNOSTIC  FINDINGS: 08/21/22 hip x-ray demos ORIF  PATIENT SURVEYS:  FOTO 40; predicted 53  EDEMA:  Notes a little R posterior thigh swelling  MUSCLE LENGTH: Hamstrings: Right ~70 deg; Left ~70 deg Thomas test: Right -10 deg; Left 10 deg  POSTURE: flexed trunk   LOWER EXTREMITY ROM:  Active/Passive ROM Right eval Left eval Right 01/09/23  Hip flexion 80/100 125/125 100/125  Hip extension     Hip abduction     Hip adduction     Hip internal rotation     Hip external rotation     Knee flexion     Knee extension     Ankle dorsiflexion     Ankle plantarflexion     Ankle inversion     Ankle eversion      (Blank rows = not tested)  LOWER EXTREMITY MMT:  MMT Right eval Left eval Right 02/09/23  Hip flexion 3-* 5 3+ within available range   Hip extension 3- 3+ 3  Hip abduction 2+ 3- 3  Hip adduction     Hip internal rotation     Hip external rotation     Knee flexion 4+ 5 5  Knee extension 3+  5 4  Ankle dorsiflexion     Ankle plantarflexion     Ankle inversion     Ankle eversion      (Blank rows = not tested) * = concordant pain  FUNCTIONAL TESTS:  5 times sit to stand: 12.95 sec Berg Balance test: 40/56  01/23/23 5 times sit<>stand: 10 sec Berg Balance test: 47/56  GAIT: Distance walked: 100' Assistive device utilized: Environmental consultant - 4 wheeled Level of assistance: Modified independence Comments: Reciprocal gait, flexed trunk   TODAY'S TREATMENT:    [facility] Adult PT Treatment:                                                DATE: 02/09/23 Therapeutic Exercise: Prone hip ext 2x10 Prone hip abd 2x10 R&L Sidelying hip abd 2x10 Sidelying hip flexion 2x10 Supine hip flexion eccentrics 10x3 sec Butterfly stretch x 30 sec Rechecked goals Manual Therapy: Grade II to III hip mobilization for hip flexion Hip PROM Gait: X2 laps around ortho gym with SPC, SBA   [facility] Adult PT Treatment:                                                DATE: 01/23/23 Therapeutic Exercise: L foot  on 4" step working on weight shift/weightbearing R LE 3x10 sec Forward step on 4" step, diagonals with 2# weighted ball 2x10 Side step on 4" step 2# weighted ball pushed fwd/bwd 2x10 Seated hip flexion 2x10 Manual Therapy: R hip mobilization for flexion in seated 2x10 Therapeutic Activity: Rechecking goals   [facility] Adult PT Treatment:                                                DATE: 01/20/23 Therapeutic Exercise: Nustep L5 x 5 min LEs only Standing hamstring stretch at steps 3 x 30 sec both Standing quad/hip flexor stretch 3 x 30 sec both Seated self rolling to quad and IT band x 2 min (suggested patient do this with rolling pin at home) right thigh and IT band Sitting piriformis stretch 2 x30 sec with strap both Supine IT band stretch with strap 3 x 20 sec with strap both  [facility] Adult PT Treatment:                                                DATE: 01/16/23 Therapeutic Exercise: Nustep L5 x 5 min LEs only Sitting pball flexion with lateral flexion x30 sec Sitting piriformis stretch 2 x30 sec Sitting figure 4 stretch 2 x30 sec Sitting hip IR/ER 2x10 Sitting step over small weight 2x10 "L" stretch x30 sec with lateral flexion x30 sec Standing hip abd yellow TB 2x10 Standing hip ext yellow TB 2x10 Gait: X2 laps around gym working on posture and trunk ext Manual Therapy: STM & TPR R QL and lumbar paraspinal    PATIENT EDUCATION:  Education details: Exam findings, POC, initial HEP Person educated: Patient Education method: Explanation, Demonstration, and Handouts Education comprehension:  verbalized understanding, returned demonstration, and needs further education  HOME EXERCISE PROGRAM: Access Code: [email or web address] URL: [email or web address] Date: 12/15/2022 Prepared by: Vernon Prey April Kirstie Peri  Exercises - Prone Gluteal Sets  - 1 x daily - 7 x weekly - 2 sets - 30 sec hold - Prone Knee Flexion  - 1 x daily - 7 x weekly - 2 sets - 10 reps - Supine Bridge  - 1  x daily - 7 x weekly - 2 sets - 10 reps - Clamshell  - 1 x daily - 7 x weekly - 2 sets - 10 reps - Hooklying Single Knee to Chest Stretch  - 1 x daily - 7 x weekly - 2 sets - 30 sec hold  ASSESSMENT:  CLINICAL IMPRESSION: Rechecked pt's LTGs. Pt is slowly making progress to meet them and will benefit from continued PT to work on hip ROM and strength. She is demonstrating improving balance and standing tolerance; however, has still not been able to wean completely off a/d and has continued R thigh pain with increased pressure. Some hip joint hypomobility since last seeing PT requiring some joint mobilization to return to prior PROM. Modified strengthening exercises to laying down as she feels unsafe performing the standing ones with the resistance band. Pt will benefit from continued PT to fully meet her goals.   OBJECTIVE IMPAIRMENTS: Abnormal gait, decreased activity tolerance, decreased balance, decreased endurance, decreased mobility, difficulty walking, decreased ROM, decreased strength, hypomobility, increased fascial restrictions, impaired flexibility, improper body mechanics, postural dysfunction, and pain.   GOALS: Goals reviewed with patient? Yes  SHORT TERM GOALS: Target date: 01/12/2023  Pt will be ind with initial HEP Baseline: Goal status: MET  2.  PT will assess 5x STS and set goals accordingly Baseline:  Goal status: MET  3.  PT will assess Berg Balance and set goals accordingly Baseline:  Goal status: MET  4.  Pt will be able to improve R hip flexion to at least 100 deg in sitting Baseline:  01/23/23: 100 deg AROM after stretching Goal status: MET   LONG TERM GOALS: Target date: 04/06/2023    Pt will be ind with management and progression of HEP Baseline:  Goal status: IN PROGRESS  2.  Pt will have 5x STS of </=10 sec to demo decreased risk of falls and improved functional LE strength Baseline: 12.96 sec 01/23/23: 10 sec Goal status: MET  3.  Pt will have  improved Berg Balance Score by at least 8 points to demo MCID for decreased fall risk Baseline: 40/56 01/23/23: 47/56 Goal status: IN PROGRESS  4.  Pt will be able to perform household amb (at least 400') without a/d  Baseline:  02/09/23: Walking with cane only when someone is at home for supervision; otherwise walking with rollator x10 min twice a day Goal status: IN PROGRESS  5.  Pt will be able to tolerate standing for at least 20 minutes to be able to cook/wash dishes Baseline:  Goal status: MET 02/09/23  6.  Pt will have improved FOTO to >/=53 Baseline: 02/09/23: 46 Goal status: IN PROGRESS   PLAN:  PT FREQUENCY: 2x/week  PT DURATION: 8 weeks  PLANNED INTERVENTIONS: Therapeutic exercises, Therapeutic activity, Neuromuscular re-education, Balance training, Gait training, Patient/Family education, Self Care, Joint mobilization, Stair training, Aquatic Therapy, Cryotherapy, Moist heat, Taping, Ionotophoresis 4mg /ml Dexamethasone, Manual therapy, and Re-evaluation  PLAN FOR NEXT SESSION:  Continue to work on hip ROM and strengthening.  Emphasize flexibility.  Work on balance and weight shifting.  [facility] April Dell Ponto, PT 02/09/23 11:03 AM [facility] [address] [place], [place] [address] Phone # [phone] Fax [phone]

## 2023-02-11 ENCOUNTER — Encounter: Payer: Self-pay | Admitting: Rehabilitative and Restorative Service Providers"

## 2023-02-11 ENCOUNTER — Ambulatory Visit: Payer: Medicare Other | Admitting: Rehabilitative and Restorative Service Providers"

## 2023-02-11 DIAGNOSIS — R2689 Other abnormalities of gait and mobility: Secondary | ICD-10-CM | POA: Diagnosis not present

## 2023-02-11 DIAGNOSIS — M25551 Pain in right hip: Secondary | ICD-10-CM

## 2023-02-11 DIAGNOSIS — M6281 Muscle weakness (generalized): Secondary | ICD-10-CM | POA: Diagnosis not present

## 2023-02-11 DIAGNOSIS — R252 Cramp and spasm: Secondary | ICD-10-CM | POA: Diagnosis not present

## 2023-02-11 DIAGNOSIS — M25651 Stiffness of right hip, not elsewhere classified: Secondary | ICD-10-CM

## 2023-02-11 DIAGNOSIS — R262 Difficulty in walking, not elsewhere classified: Secondary | ICD-10-CM

## 2023-02-11 NOTE — Therapy (Signed)
OUTPATIENT PHYSICAL THERAPY TREATMENT AND REASSESSMENT NOTE   Patient Name: Carrie Barnes MRN: [id number] DOB:08/13/1935, 87 y.o., female Today's Date: 02/11/2023  END OF SESSION:  PT End of Session - 02/11/23 1404     Visit Number 12    Date for PT Re-Evaluation 04/03/23    Authorization Type BCBS Medicare    Progress Note Due on Visit 20    PT Start Time 1400    PT Stop Time 1440    PT Time Calculation (min) 40 min    Activity Tolerance Patient tolerated treatment well    Behavior During Therapy [facility] for tasks assessed/performed              Past Medical History:  Diagnosis Date   Cholecystitis    Coronary artery disease    a. 03/2017: 80-90% LAD stenosis (PCI/DES placement with a 2.75x16 mm Promus Premier stent). No significant stenosis along RCA or LCx.    DJD (degenerative joint disease)    GERD (gastroesophageal reflux disease)    Hyperlipidemia    Hypertension    dx s/p MI    Ischemic cardiomyopathy    Myocardial infarction (HCC) 03/17/2017   03-2017 treated at [facility]    Osteoporosis    Status post insertion of drug-eluting stent into left anterior descending (LAD) artery 03/2017   Past Surgical History:  Procedure Laterality Date   APPENDECTOMY     CESAREAN SECTION     CHOLECYSTECTOMY N/A 11/05/2017   Procedure: LAPAROSCOPIC CHOLECYSTECTOMY WITH INTRAOPERATIVE CHOLANGIOGRAM;  Surgeon: Darnell Level, MD;  Location: [facility];  Service: General;  Laterality: N/A;   ERCP N/A 11/11/2017   Procedure: ENDOSCOPIC RETROGRADE CHOLANGIOPANCREATOGRAPHY (ERCP);  Surgeon: Willis Modena, MD;  Location: [facility];  Service: Endoscopy;  Laterality: N/A;  possible   EUS N/A 11/11/2017   Procedure: UPPER ENDOSCOPIC ULTRASOUND (EUS) RADIAL;  Surgeon: Willis Modena, MD;  Location: [facility];  Service: Endoscopy;  Laterality: N/A;   FEMUR IM NAIL Right 07/13/2019   Procedure: INTRAMEDULLARY (IM) RETROGRADE FEMORAL NAILING;  Surgeon: Ollen Gross,  MD;  Location: [facility];  Service: Orthopedics;  Laterality: Right;    LUMBAR LAMINECTOMY/ DECOMPRESSION WITH MET-RX     OPEN REDUCTION INTERNAL FIXATION ACETABULAR FRACTURE STOPPA Right 05/14/2022   Procedure: OPEN REDUCTION INTERNAL FIXATION RIGHT ACETABULUM;  Surgeon: Roby Lofts, MD;  Location: [facility] OR;  Service: Orthopedics;  Laterality: Right;   ORIF FEMUR FRACTURE Right 08/21/2022   Procedure: RIGHT OPEN REDUCTION INTERNAL FIXATION (ORIF) DISTAL FEMUR FRACTURE;  Surgeon: Roby Lofts, MD;  Location: [facility] OR;  Service: Orthopedics;  Laterality: Right;   REVERSE SHOULDER ARTHROPLASTY Right 05/15/2022   Procedure: RIGHT REVERSE SHOULDER ARTHROPLASTY;  Surgeon: Yolonda Kida, MD;  Location: [facility] OR;  Service: Orthopedics;  Laterality: Right;   SHOULDER ARTHROSCOPY WITH ROTATOR CUFF REPAIR AND SUBACROMIAL DECOMPRESSION Right 09/20/2021   Procedure: SHOULDER MINI OPEN ROTATOR CUFF REPAIR AND SUBACROMIAL DECOMPRESSION WITH POSSIBLE PATCH GRAFT;  Surgeon: Jene Every, MD;  Location: [facility];  Service: Orthopedics;  Laterality: Right;  90 MINS   stent  Left 03/2017   anterior descending ; drug eluting ; tx of MI at [facility]    TONSILLECTOMY     VAGINAL HYSTERECTOMY     vaginal sling     Patient Active Problem List   Diagnosis Date Noted   Closed bicondylar fracture of distal femur (HCC) 08/20/2022   Hyponatremia 08/20/2022   Hypomagnesemia 05/13/2022   Pelvis fracture (HCC) 05/12/2022  Humeral head fracture, right, closed, initial encounter 05/12/2022   Elevated troponin 05/12/2022   Hematoma 05/12/2022   Hypertension 11/05/2021   Complete rotator cuff tear 09/20/2021   Closed intertrochanteric fracture of right hip (HCC) 07/11/2019   Closed right hip fracture (HCC) 07/11/2019   Chronic cholecystitis due to cholelithiasis with choledocholithiasis 11/05/2017   Cholelithiasis with chronic cholecystitis 11/01/2017   Cholelithiasis 05/18/2017   CAD in native  artery 03/31/2017   Ischemic cardiomyopathy 03/31/2017   Hyperlipemia 03/31/2017   DJD (degenerative joint disease) 03/31/2017   Osteoporosis 03/31/2017    PCP: Renford Dills, MD  REFERRING PROVIDER: Dion Saucier D, PA  REFERRING DIAG: ORIF R Distal Femur FX 08/21/22 ORIF R Acetabulum FX 05/14/22  THERAPY DIAG:  Muscle weakness (generalized)  Pain in right hip  Stiffness of right hip, not elsewhere classified  Difficulty in walking, not elsewhere classified  Rationale for Evaluation and Treatment: Rehabilitation  ONSET DATE: R hip ORIF 08/21/22  SUBJECTIVE:   SUBJECTIVE STATEMENT: Pt reports that she is having increased right leg pain today.  She is not sure why, it just started hurting more today.  PERTINENT HISTORY: From H&P: 87 yo female with history of CAD, ischemic cardiomyopathy, paroxysmal atrial fibrillation, HTN, HLD, PACs and spinal stenosis 3rd R hip fx, R shoulder fx  PAIN:  Are you having pain? Yes: NPRS scale: 6/10 Pain location: R front thigh and knee Pain description: dull Aggravating factors: standing/walking Relieving factors: rest  PRECAUTIONS: None  WEIGHT BEARING RESTRICTIONS: No  FALLS:  Has patient fallen in last 6 months? Yes. Number of falls 2  LIVING ENVIRONMENT: Lives with: lives alone Daughter lives close; family checks in daily Lives in: House/apartment Stairs: No Has following equipment at home: Dan Humphreys - 4 wheeled, shower chair, bed side commode, and Ramped entry  OCCUPATION: Retired  PATIENT GOALS: "I just want to get stronger and not be susceptible to losing balance"  NEXT MD VISIT: n/a  OBJECTIVE: (Measures in this section from initial evaluation unless otherwise noted)  DIAGNOSTIC FINDINGS: 08/21/22 hip x-ray demos ORIF  PATIENT SURVEYS:  Eval:  FOTO 40; predicted 53 02/11/2023:  FOTO 59 (goal met)  EDEMA:  Notes a little R posterior thigh swelling  MUSCLE LENGTH: Hamstrings: Right ~70 deg; Left ~70 deg Thomas  test: Right -10 deg; Left 10 deg  POSTURE: flexed trunk   LOWER EXTREMITY ROM:  Active/Passive ROM Right eval Left eval Right 01/09/23  Hip flexion 80/100 125/125 100/125    LOWER EXTREMITY MMT:  MMT Right eval Left eval Right 02/09/23  Hip flexion 3-* 5 3+ within available range   Hip extension 3- 3+ 3  Hip abduction 2+ 3- 3  Hip adduction     Hip internal rotation     Hip external rotation     Knee flexion 4+ 5 5  Knee extension 3+ 5 4  Ankle dorsiflexion     Ankle plantarflexion     Ankle inversion     Ankle eversion      (Blank rows = not tested) * = concordant pain  FUNCTIONAL TESTS:  Eval: 5 times sit to stand: 12.95 sec Berg Balance test: 40/56  01/23/23 5 times sit<>stand: 10 sec Berg Balance test: 47/56  02/11/2023: 5 times sit to stand: 10.94 sec without UE Timed Up and Go (TUG):  12.24 sec with 4WRW 3 minute walk test:  481 ft with RPE of 8/10  GAIT: Distance walked: 100' Assistive device utilized: Environmental consultant - 4 wheeled Level of assistance: Modified  independence Comments: Reciprocal gait, flexed trunk   TODAY'S TREATMENT:     DATE: 02/11/2023 Nustep level 3 x6 min with PT present to discuss status Sit to/from stand x5, TUG FOTO 3 min ambulation around PT gyms for 481 ft with RPE of 8/10 reported Seated hamstring stretch 2x20 sec bilat Seated piriformis stretch 2x20 sec bilat Seated with 2# on ankles:  hip flexion up and over a low floor disc 2x10 bilat Seated with 2# on ankles:  LAQ and hip ER.  2x10 each bilat   DATE: 02/09/23 Therapeutic Exercise: Prone hip ext 2x10 Prone hip abd 2x10 R&L Sidelying hip abd 2x10 Sidelying hip flexion 2x10 Supine hip flexion eccentrics 10x3 sec Butterfly stretch x 30 sec Rechecked goals Manual Therapy: Grade II to III hip mobilization for hip flexion Hip PROM Gait: X2 laps around ortho gym with SPC, SBA   DATE: 01/23/23 Therapeutic Exercise: L foot on 4" step working on weight shift/weightbearing R LE  3x10 sec Forward step on 4" step, diagonals with 2# weighted ball 2x10 Side step on 4" step 2# weighted ball pushed fwd/bwd 2x10 Seated hip flexion 2x10 Manual Therapy: R hip mobilization for flexion in seated 2x10 Therapeutic Activity: Rechecking goals      PATIENT EDUCATION:  Education details: Exam findings, POC, initial HEP Person educated: Patient Education method: Explanation, Demonstration, and Handouts Education comprehension: verbalized understanding, returned demonstration, and needs further education  HOME EXERCISE PROGRAM: Access Code: [email or web address] URL: [email or web address] Date: 02/11/2023 Prepared by: Reather Laurence  Exercises - Figure 4 Bridge (Mirrored)  - 1 x daily - 7 x weekly - 2 sets - 10 reps - Standing Hamstring Curl with Resistance  - 1 x daily - 7 x weekly - 2 sets - 10 reps - Sidelying Bent Knee Hip Flexion  - 1 x daily - 7 x weekly - 2 sets - 10 reps - Standing 'L' Stretch at Counter  - 1 x daily - 7 x weekly - 2 sets - 30 sec hold - Standing Lumbar Extension with Counter  - 1 x daily - 7 x weekly - 1 sets - 5 reps - 5 sec hold - Seated Piriformis Stretch  - 1 x daily - 7 x weekly - 2 sets - 30 sec hold - Seated Piriformis Stretch  - 1 x daily - 7 x weekly - 2 sets - 30 sec hold - Sidelying Hip Abduction  - 1 x daily - 7 x weekly - 2 sets - 10 reps - Prone Hip Extension - Two Pillows  - 1 x daily - 7 x weekly - 2 sets - 10 reps - Seated Straight Leg Raise with Support  - 1 x daily - 7 x weekly - 2 sets - 10 reps  ASSESSMENT:  CLINICAL IMPRESSION: Ms Domico presents to skilled PT reporting increased pain today compared to last visit.  Patient with improved FOTO score noted today and able to mark that goal as achieved.  Patient with slight increased time noted on 5 times sit to stand, but could be secondary to increased right leg pain.  Patient unable to achieve seated hip flexion over small hurdle secondary to hip weakness, so changed to  small disc and patient had more success.  Patient was able to progress with strengthening with 2 pound ankle weights during session and was able to ambulate 481 ft in 3 minute walk test with the 4WRW.  Patient has not yet met all goals and continues to require skilled PT to progress  towards goals.  Recommend 2x/week for 8 additional weeks.  OBJECTIVE IMPAIRMENTS: Abnormal gait, decreased activity tolerance, decreased balance, decreased endurance, decreased mobility, difficulty walking, decreased ROM, decreased strength, hypomobility, increased fascial restrictions, impaired flexibility, improper body mechanics, postural dysfunction, and pain.   GOALS: Goals reviewed with patient? Yes  SHORT TERM GOALS: Target date: 01/12/2023  Pt will be ind with initial HEP Baseline: Goal status: MET  2.  PT will assess 5x STS and set goals accordingly Baseline:  Goal status: MET  3.  PT will assess Berg Balance and set goals accordingly Baseline:  Goal status: MET  4.  Pt will be able to improve R hip flexion to at least 100 deg in sitting Baseline:  01/23/23: 100 deg AROM after stretching Goal status: MET   LONG TERM GOALS: Target date: 04/03/2023    Pt will be ind with management and progression of HEP Baseline:  Goal status: IN PROGRESS  2.  Pt will have 5x STS of </=10 sec to demo decreased risk of falls and improved functional LE strength Baseline: 12.96 sec 01/23/23: 10 sec Goal status: IN PROGRESS  3.  Pt will have improved Berg Balance Score by at least 8 points to demo MCID for decreased fall risk Baseline: 40/56 01/23/23: 47/56 Goal status: IN PROGRESS  4.  Pt will be able to perform household amb (at least 400') without a/d  Baseline:  02/09/23: Walking with cane only when someone is at home for supervision; otherwise walking with rollator x10 min twice a day Goal status: IN PROGRESS  5.  Pt will be able to tolerate standing for at least 20 minutes to be able to cook/wash  dishes Baseline:  Goal status: MET 02/09/23  6.  Pt will have improved FOTO to >/=53 Baseline: 02/09/23: 46 Goal status: MET on 02/11/2023   PLAN:  PT FREQUENCY: 2x/week  PT DURATION: 8 weeks  PLANNED INTERVENTIONS: Therapeutic exercises, Therapeutic activity, Neuromuscular re-education, Balance training, Gait training, Patient/Family education, Self Care, Joint mobilization, Stair training, Aquatic Therapy, Cryotherapy, Moist heat, Taping, Ionotophoresis 4mg /ml Dexamethasone, Manual therapy, and Re-evaluation  PLAN FOR NEXT SESSION:  Continue to work on hip ROM and strengthening.  Emphasize flexibility.  Work on balance and weight shifting.    Reather Laurence, PT 02/11/23 2:06 PM  [facility] [address] [place], [place] [address] Phone # [phone] Fax [phone]

## 2023-02-16 NOTE — Addendum Note (Signed)
Addended by: Jules Husbands MARIE L on: 02/16/2023 05:05 PM   Modules accepted: Orders

## 2023-02-17 DIAGNOSIS — H26492 Other secondary cataract, left eye: Secondary | ICD-10-CM | POA: Diagnosis not present

## 2023-02-17 DIAGNOSIS — H04123 Dry eye syndrome of bilateral lacrimal glands: Secondary | ICD-10-CM | POA: Diagnosis not present

## 2023-02-17 DIAGNOSIS — H401122 Primary open-angle glaucoma, left eye, moderate stage: Secondary | ICD-10-CM | POA: Diagnosis not present

## 2023-02-17 DIAGNOSIS — H43813 Vitreous degeneration, bilateral: Secondary | ICD-10-CM | POA: Diagnosis not present

## 2023-02-19 ENCOUNTER — Ambulatory Visit: Payer: Medicare Other

## 2023-02-19 DIAGNOSIS — M25651 Stiffness of right hip, not elsewhere classified: Secondary | ICD-10-CM | POA: Diagnosis not present

## 2023-02-19 DIAGNOSIS — R252 Cramp and spasm: Secondary | ICD-10-CM

## 2023-02-19 DIAGNOSIS — M25551 Pain in right hip: Secondary | ICD-10-CM

## 2023-02-19 DIAGNOSIS — R2689 Other abnormalities of gait and mobility: Secondary | ICD-10-CM | POA: Diagnosis not present

## 2023-02-19 DIAGNOSIS — R262 Difficulty in walking, not elsewhere classified: Secondary | ICD-10-CM

## 2023-02-19 DIAGNOSIS — M6281 Muscle weakness (generalized): Secondary | ICD-10-CM | POA: Diagnosis not present

## 2023-02-19 NOTE — Therapy (Signed)
OUTPATIENT PHYSICAL THERAPY TREATMENT AND REASSESSMENT NOTE   Patient Name: Carrie Barnes MRN: [id number] DOB:07/03/1936, 87 y.o., female Today's Date: 02/19/2023  END OF SESSION:  PT End of Session - 02/19/23 0910     Visit Number 13    Number of Visits 16    Date for PT Re-Evaluation 04/03/23    Authorization Type BCBS Medicare    Progress Note Due on Visit 20    PT Start Time 0850    PT Stop Time 0930    PT Time Calculation (min) 40 min    Activity Tolerance Patient tolerated treatment well    Behavior During Therapy [facility] for tasks assessed/performed              Past Medical History:  Diagnosis Date   Cholecystitis    Coronary artery disease    a. 03/2017: 80-90% LAD stenosis (PCI/DES placement with a 2.75x16 mm Promus Premier stent). No significant stenosis along RCA or LCx.    DJD (degenerative joint disease)    GERD (gastroesophageal reflux disease)    Hyperlipidemia    Hypertension    dx s/p MI    Ischemic cardiomyopathy    Myocardial infarction (HCC) 03/17/2017   03-2017 treated at [facility]    Osteoporosis    Status post insertion of drug-eluting stent into left anterior descending (LAD) artery 03/2017   Past Surgical History:  Procedure Laterality Date   APPENDECTOMY     CESAREAN SECTION     CHOLECYSTECTOMY N/A 11/05/2017   Procedure: LAPAROSCOPIC CHOLECYSTECTOMY WITH INTRAOPERATIVE CHOLANGIOGRAM;  Surgeon: Darnell Level, MD;  Location: [facility];  Service: General;  Laterality: N/A;   ERCP N/A 11/11/2017   Procedure: ENDOSCOPIC RETROGRADE CHOLANGIOPANCREATOGRAPHY (ERCP);  Surgeon: Willis Modena, MD;  Location: [facility];  Service: Endoscopy;  Laterality: N/A;  possible   EUS N/A 11/11/2017   Procedure: UPPER ENDOSCOPIC ULTRASOUND (EUS) RADIAL;  Surgeon: Willis Modena, MD;  Location: [facility];  Service: Endoscopy;  Laterality: N/A;   FEMUR IM NAIL Right 07/13/2019   Procedure: INTRAMEDULLARY (IM) RETROGRADE FEMORAL NAILING;   Surgeon: Ollen Gross, MD;  Location: [facility];  Service: Orthopedics;  Laterality: Right;    LUMBAR LAMINECTOMY/ DECOMPRESSION WITH MET-RX     OPEN REDUCTION INTERNAL FIXATION ACETABULAR FRACTURE STOPPA Right 05/14/2022   Procedure: OPEN REDUCTION INTERNAL FIXATION RIGHT ACETABULUM;  Surgeon: Roby Lofts, MD;  Location: [facility] OR;  Service: Orthopedics;  Laterality: Right;   ORIF FEMUR FRACTURE Right 08/21/2022   Procedure: RIGHT OPEN REDUCTION INTERNAL FIXATION (ORIF) DISTAL FEMUR FRACTURE;  Surgeon: Roby Lofts, MD;  Location: [facility] OR;  Service: Orthopedics;  Laterality: Right;   REVERSE SHOULDER ARTHROPLASTY Right 05/15/2022   Procedure: RIGHT REVERSE SHOULDER ARTHROPLASTY;  Surgeon: Yolonda Kida, MD;  Location: [facility] OR;  Service: Orthopedics;  Laterality: Right;   SHOULDER ARTHROSCOPY WITH ROTATOR CUFF REPAIR AND SUBACROMIAL DECOMPRESSION Right 09/20/2021   Procedure: SHOULDER MINI OPEN ROTATOR CUFF REPAIR AND SUBACROMIAL DECOMPRESSION WITH POSSIBLE PATCH GRAFT;  Surgeon: Jene Every, MD;  Location: [facility];  Service: Orthopedics;  Laterality: Right;  90 MINS   stent  Left 03/2017   anterior descending ; drug eluting ; tx of MI at [facility]    TONSILLECTOMY     VAGINAL HYSTERECTOMY     vaginal sling     Patient Active Problem List   Diagnosis Date Noted   Closed bicondylar fracture of distal femur (HCC) 08/20/2022   Hyponatremia 08/20/2022   Hypomagnesemia 05/13/2022  Pelvis fracture (HCC) 05/12/2022   Humeral head fracture, right, closed, initial encounter 05/12/2022   Elevated troponin 05/12/2022   Hematoma 05/12/2022   Hypertension 11/05/2021   Complete rotator cuff tear 09/20/2021   Closed intertrochanteric fracture of right hip (HCC) 07/11/2019   Closed right hip fracture (HCC) 07/11/2019   Chronic cholecystitis due to cholelithiasis with choledocholithiasis 11/05/2017   Cholelithiasis with chronic cholecystitis 11/01/2017   Cholelithiasis  05/18/2017   CAD in native artery 03/31/2017   Ischemic cardiomyopathy 03/31/2017   Hyperlipemia 03/31/2017   DJD (degenerative joint disease) 03/31/2017   Osteoporosis 03/31/2017    PCP: Renford Dills, MD  REFERRING PROVIDER: Dion Saucier D, PA  REFERRING DIAG: ORIF R Distal Femur FX 08/21/22 ORIF R Acetabulum FX 05/14/22  THERAPY DIAG:  Muscle weakness (generalized)  Pain in right hip  Stiffness of right hip, not elsewhere classified  Difficulty in walking, not elsewhere classified  Cramp and spasm  Rationale for Evaluation and Treatment: Rehabilitation  ONSET DATE: R hip ORIF 08/21/22  SUBJECTIVE:   SUBJECTIVE STATEMENT: Patient reports   PERTINENT HISTORY: From H&P: 87 yo female with history of CAD, ischemic cardiomyopathy, paroxysmal atrial fibrillation, HTN, HLD, PACs and spinal stenosis 3rd R hip fx, R shoulder fx  PAIN:  Are you having pain? Yes: NPRS scale: 6/10 Pain location: R front thigh and knee Pain description: dull Aggravating factors: standing/walking Relieving factors: rest  PRECAUTIONS: None  WEIGHT BEARING RESTRICTIONS: No  FALLS:  Has patient fallen in last 6 months? Yes. Number of falls 2  LIVING ENVIRONMENT: Lives with: lives alone Daughter lives close; family checks in daily Lives in: House/apartment Stairs: No Has following equipment at home: Dan Humphreys - 4 wheeled, shower chair, bed side commode, and Ramped entry  OCCUPATION: Retired  PATIENT GOALS: "I just want to get stronger and not be susceptible to losing balance"  NEXT MD VISIT: n/a  OBJECTIVE: (Measures in this section from initial evaluation unless otherwise noted)  DIAGNOSTIC FINDINGS: 08/21/22 hip x-ray demos ORIF  PATIENT SURVEYS:  Eval:  FOTO 40; predicted 53 02/11/2023:  FOTO 59 (goal met)  EDEMA:  Notes a little R posterior thigh swelling  MUSCLE LENGTH: Hamstrings: Right ~70 deg; Left ~70 deg Thomas test: Right -10 deg; Left 10 deg  POSTURE: flexed trunk    LOWER EXTREMITY ROM:  Active/Passive ROM Right eval Left eval Right 01/09/23  Hip flexion 80/100 125/125 100/125    LOWER EXTREMITY MMT:  MMT Right eval Left eval Right 02/09/23  Hip flexion 3-* 5 3+ within available range   Hip extension 3- 3+ 3  Hip abduction 2+ 3- 3  Hip adduction     Hip internal rotation     Hip external rotation     Knee flexion 4+ 5 5  Knee extension 3+ 5 4  Ankle dorsiflexion     Ankle plantarflexion     Ankle inversion     Ankle eversion      (Blank rows = not tested) * = concordant pain  FUNCTIONAL TESTS:  Eval: 5 times sit to stand: 12.95 sec Berg Balance test: 40/56  01/23/23 5 times sit<>stand: 10 sec Berg Balance test: 47/56  02/11/2023: 5 times sit to stand: 10.94 sec without UE Timed Up and Go (TUG):  12.24 sec with 4WRW 3 minute walk test:  481 ft with RPE of 8/10  GAIT: Distance walked: 100' Assistive device utilized: Environmental consultant - 4 wheeled Level of assistance: Modified independence Comments: Reciprocal gait, flexed trunk   TODAY'S TREATMENT:  DATE: 02/19/2023 Nustep level 3 x6 min with PT present to discuss status Supine quad sets x 20 (right) Supine hip abduction 2 x 10 right Supine SLR (right) Supine heel slides 2 x 10 right Hook lying SAQ 2 x 10 0# right Trigger Point Dry-Needling  Treatment instructions: Expect mild to moderate muscle soreness. S/S of pneumothorax if dry needled over a lung field, and to seek immediate medical attention should they occur. Patient verbalized understanding of these instructions and education. Patient Consent Given: Yes Education handout provided: Yes Muscles treated: right lateral quad  Electrical stimulation performed: No Parameters: N/A Treatment response/outcome: Skilled palpation used to identify taut bands and trigger points.  Once identified, dry needling techniques used to treat these areas.  Twitch response ellicited along with palpable elongation of muscle.  Following  treatment, patient reported significant relief of pain and tension in the muscle.    DATE: 02/11/2023 Nustep level 3 x6 min with PT present to discuss status Sit to/from stand x5, TUG FOTO 3 min ambulation around PT gyms for 481 ft with RPE of 8/10 reported Seated hamstring stretch 2x20 sec bilat Seated piriformis stretch 2x20 sec bilat Seated with 2# on ankles:  hip flexion up and over a low floor disc 2x10 bilat Seated with 2# on ankles:  LAQ and hip ER.  2x10 each bilat   DATE: 02/09/23 Therapeutic Exercise: Prone hip ext 2x10 Prone hip abd 2x10 R&L Sidelying hip abd 2x10 Sidelying hip flexion 2x10 Supine hip flexion eccentrics 10x3 sec Butterfly stretch x 30 sec Rechecked goals Manual Therapy: Grade II to III hip mobilization for hip flexion Hip PROM Gait: X2 laps around ortho gym with SPC, SBA   DATE: 01/23/23 Therapeutic Exercise: L foot on 4" step working on weight shift/weightbearing R LE 3x10 sec Forward step on 4" step, diagonals with 2# weighted ball 2x10 Side step on 4" step 2# weighted ball pushed fwd/bwd 2x10 Seated hip flexion 2x10 Manual Therapy: R hip mobilization for flexion in seated 2x10 Therapeutic Activity: Rechecking goals      PATIENT EDUCATION:  Education details: Exam findings, POC, initial HEP Person educated: Patient Education method: Explanation, Demonstration, and Handouts Education comprehension: verbalized understanding, returned demonstration, and needs further education  HOME EXERCISE PROGRAM: Access Code: [email or web address] URL: [email or web address] Date: 02/19/2023 Prepared by: Mikey Kirschner  Exercises - Figure 4 Bridge (Mirrored)  - 1 x daily - 7 x weekly - 2 sets - 10 reps - Standing Hamstring Curl with Resistance  - 1 x daily - 7 x weekly - 2 sets - 10 reps - Sidelying Bent Knee Hip Flexion  - 1 x daily - 7 x weekly - 2 sets - 10 reps - Standing 'L' Stretch at Counter  - 1 x daily - 7 x weekly - 2 sets - 30 sec  hold - Standing Lumbar Extension with Counter  - 1 x daily - 7 x weekly - 1 sets - 5 reps - 5 sec hold - Seated Piriformis Stretch  - 1 x daily - 7 x weekly - 2 sets - 30 sec hold - Seated Piriformis Stretch  - 1 x daily - 7 x weekly - 2 sets - 30 sec hold - Sidelying Hip Abduction  - 1 x daily - 7 x weekly - 2 sets - 10 reps - Prone Hip Extension - Two Pillows  - 1 x daily - 7 x weekly - 2 sets - 10 reps - Seated Straight Leg Raise with Support  - 1 x  daily - 7 x weekly - 2 sets - 10 reps - Theatre manager with Chair and Counter Support  - 1 x daily - 7 x weekly - 1 sets - 3 reps - 30 sec hold  ASSESSMENT:  CLINICAL IMPRESSION: Ms Taves responded very well to DN today.  She came in with 5/10 pain and reported 1/10 after DN.  Her gait improved to nearly normal as well.  She completed all exercises with increased ease.  We also added standing quad stretch with foot in low position.  She completed 3 reps of 30 seconds.  She reported overall significant improvement upon completion of session.  She would benefit from continued skilled PT for right hip stretching and strengthening along with repeating DN as indicated.    OBJECTIVE IMPAIRMENTS: Abnormal gait, decreased activity tolerance, decreased balance, decreased endurance, decreased mobility, difficulty walking, decreased ROM, decreased strength, hypomobility, increased fascial restrictions, impaired flexibility, improper body mechanics, postural dysfunction, and pain.   GOALS: Goals reviewed with patient? Yes  SHORT TERM GOALS: Target date: 01/12/2023  Pt will be ind with initial HEP Baseline: Goal status: MET  2.  PT will assess 5x STS and set goals accordingly Baseline:  Goal status: MET  3.  PT will assess Berg Balance and set goals accordingly Baseline:  Goal status: MET  4.  Pt will be able to improve R hip flexion to at least 100 deg in sitting Baseline:  01/23/23: 100 deg AROM after stretching Goal status: MET   LONG  TERM GOALS: Target date: 04/03/2023    Pt will be ind with management and progression of HEP Baseline:  Goal status: IN PROGRESS  2.  Pt will have 5x STS of </=10 sec to demo decreased risk of falls and improved functional LE strength Baseline: 12.96 sec 01/23/23: 10 sec Goal status: IN PROGRESS  3.  Pt will have improved Berg Balance Score by at least 8 points to demo MCID for decreased fall risk Baseline: 40/56 01/23/23: 47/56 Goal status: IN PROGRESS  4.  Pt will be able to perform household amb (at least 400') without a/d  Baseline:  02/09/23: Walking with cane only when someone is at home for supervision; otherwise walking with rollator x10 min twice a day Goal status: IN PROGRESS  5.  Pt will be able to tolerate standing for at least 20 minutes to be able to cook/wash dishes Baseline:  Goal status: MET 02/09/23  6.  Pt will have improved FOTO to >/=53 Baseline: 02/09/23: 46 Goal status: MET on 02/11/2023   PLAN:  PT FREQUENCY: 2x/week  PT DURATION: 8 weeks  PLANNED INTERVENTIONS: Therapeutic exercises, Therapeutic activity, Neuromuscular re-education, Balance training, Gait training, Patient/Family education, Self Care, Joint mobilization, Stair training, Aquatic Therapy, Cryotherapy, Moist heat, Taping, Ionotophoresis 4mg /ml Dexamethasone, Manual therapy, and Re-evaluation  PLAN FOR NEXT SESSION:  Continue to work on hip ROM and strengthening.  Emphasize flexibility.  Work on balance and weight shifting. DN as indicated for right quad and abductors.     Victorino Dike B. Afsana Liera, PT 02/19/23 10:13 AM [facility] [address], [address] [place], [place] [address] Phone # [phone] Fax [phone]

## 2023-02-25 ENCOUNTER — Ambulatory Visit: Payer: Medicare Other

## 2023-02-25 DIAGNOSIS — R262 Difficulty in walking, not elsewhere classified: Secondary | ICD-10-CM | POA: Diagnosis not present

## 2023-02-25 DIAGNOSIS — M25551 Pain in right hip: Secondary | ICD-10-CM | POA: Diagnosis not present

## 2023-02-25 DIAGNOSIS — R252 Cramp and spasm: Secondary | ICD-10-CM

## 2023-02-25 DIAGNOSIS — M6281 Muscle weakness (generalized): Secondary | ICD-10-CM

## 2023-02-25 DIAGNOSIS — R2689 Other abnormalities of gait and mobility: Secondary | ICD-10-CM | POA: Diagnosis not present

## 2023-02-25 DIAGNOSIS — M25651 Stiffness of right hip, not elsewhere classified: Secondary | ICD-10-CM

## 2023-02-25 NOTE — Therapy (Signed)
OUTPATIENT PHYSICAL THERAPY TREATMENT AND REASSESSMENT NOTE   Patient Name: Carrie Barnes MRN: [id number] DOB:1936/06/11, 87 y.o., female Today's Date: 02/25/2023  END OF SESSION:  PT End of Session - 02/25/23 1058     Visit Number 14    Date for PT Re-Evaluation 04/03/23    Authorization Type BCBS Medicare    PT Start Time 1100    PT Stop Time 1144    PT Time Calculation (min) 44 min    Activity Tolerance Patient tolerated treatment well    Behavior During Therapy [facility] for tasks assessed/performed              Past Medical History:  Diagnosis Date   Cholecystitis    Coronary artery disease    a. 03/2017: 80-90% LAD stenosis (PCI/DES placement with a 2.75x16 mm Promus Premier stent). No significant stenosis along RCA or LCx.    DJD (degenerative joint disease)    GERD (gastroesophageal reflux disease)    Hyperlipidemia    Hypertension    dx s/p MI    Ischemic cardiomyopathy    Myocardial infarction (HCC) 03/17/2017   03-2017 treated at [facility]    Osteoporosis    Status post insertion of drug-eluting stent into left anterior descending (LAD) artery 03/2017   Past Surgical History:  Procedure Laterality Date   APPENDECTOMY     CESAREAN SECTION     CHOLECYSTECTOMY N/A 11/05/2017   Procedure: LAPAROSCOPIC CHOLECYSTECTOMY WITH INTRAOPERATIVE CHOLANGIOGRAM;  Surgeon: Darnell Level, MD;  Location: [facility];  Service: General;  Laterality: N/A;   ERCP N/A 11/11/2017   Procedure: ENDOSCOPIC RETROGRADE CHOLANGIOPANCREATOGRAPHY (ERCP);  Surgeon: Willis Modena, MD;  Location: [facility];  Service: Endoscopy;  Laterality: N/A;  possible   EUS N/A 11/11/2017   Procedure: UPPER ENDOSCOPIC ULTRASOUND (EUS) RADIAL;  Surgeon: Willis Modena, MD;  Location: [facility];  Service: Endoscopy;  Laterality: N/A;   FEMUR IM NAIL Right 07/13/2019   Procedure: INTRAMEDULLARY (IM) RETROGRADE FEMORAL NAILING;  Surgeon: Ollen Gross, MD;  Location: [facility];  Service:  Orthopedics;  Laterality: Right;    LUMBAR LAMINECTOMY/ DECOMPRESSION WITH MET-RX     OPEN REDUCTION INTERNAL FIXATION ACETABULAR FRACTURE STOPPA Right 05/14/2022   Procedure: OPEN REDUCTION INTERNAL FIXATION RIGHT ACETABULUM;  Surgeon: Roby Lofts, MD;  Location: [facility] OR;  Service: Orthopedics;  Laterality: Right;   ORIF FEMUR FRACTURE Right 08/21/2022   Procedure: RIGHT OPEN REDUCTION INTERNAL FIXATION (ORIF) DISTAL FEMUR FRACTURE;  Surgeon: Roby Lofts, MD;  Location: [facility] OR;  Service: Orthopedics;  Laterality: Right;   REVERSE SHOULDER ARTHROPLASTY Right 05/15/2022   Procedure: RIGHT REVERSE SHOULDER ARTHROPLASTY;  Surgeon: Yolonda Kida, MD;  Location: [facility] OR;  Service: Orthopedics;  Laterality: Right;   SHOULDER ARTHROSCOPY WITH ROTATOR CUFF REPAIR AND SUBACROMIAL DECOMPRESSION Right 09/20/2021   Procedure: SHOULDER MINI OPEN ROTATOR CUFF REPAIR AND SUBACROMIAL DECOMPRESSION WITH POSSIBLE PATCH GRAFT;  Surgeon: Jene Every, MD;  Location: [facility];  Service: Orthopedics;  Laterality: Right;  90 MINS   stent  Left 03/2017   anterior descending ; drug eluting ; tx of MI at [facility]    TONSILLECTOMY     VAGINAL HYSTERECTOMY     vaginal sling     Patient Active Problem List   Diagnosis Date Noted   Closed bicondylar fracture of distal femur (HCC) 08/20/2022   Hyponatremia 08/20/2022   Hypomagnesemia 05/13/2022   Pelvis fracture (HCC) 05/12/2022   Humeral head fracture, right, closed, initial encounter 05/12/2022  Elevated troponin 05/12/2022   Hematoma 05/12/2022   Hypertension 11/05/2021   Complete rotator cuff tear 09/20/2021   Closed intertrochanteric fracture of right hip (HCC) 07/11/2019   Closed right hip fracture (HCC) 07/11/2019   Chronic cholecystitis due to cholelithiasis with choledocholithiasis 11/05/2017   Cholelithiasis with chronic cholecystitis 11/01/2017   Cholelithiasis 05/18/2017   CAD in native artery 03/31/2017   Ischemic  cardiomyopathy 03/31/2017   Hyperlipemia 03/31/2017   DJD (degenerative joint disease) 03/31/2017   Osteoporosis 03/31/2017    PCP: Renford Dills, MD  REFERRING PROVIDER: Dion Saucier D, PA  REFERRING DIAG: ORIF R Distal Femur FX 08/21/22 ORIF R Acetabulum FX 05/14/22  THERAPY DIAG:  Muscle weakness (generalized)  Pain in right hip  Stiffness of right hip, not elsewhere classified  Difficulty in walking, not elsewhere classified  Cramp and spasm  Rationale for Evaluation and Treatment: Rehabilitation  ONSET DATE: R hip ORIF 08/21/22  SUBJECTIVE:   SUBJECTIVE STATEMENT: Patient reports she had about 3-4 days of relief then "it started tightening up again".  She admits she wasn't feeling well at this week and she has been sitting more.    PERTINENT HISTORY: From H&P: 87 yo female with history of CAD, ischemic cardiomyopathy, paroxysmal atrial fibrillation, HTN, HLD, PACs and spinal stenosis 3rd R hip fx, R shoulder fx  PAIN:  Are you having pain? Yes: NPRS scale: 6/10 Pain location: R front thigh and knee Pain description: dull Aggravating factors: standing/walking Relieving factors: rest  PRECAUTIONS: None  WEIGHT BEARING RESTRICTIONS: No  FALLS:  Has patient fallen in last 6 months? Yes. Number of falls 2  LIVING ENVIRONMENT: Lives with: lives alone Daughter lives close; family checks in daily Lives in: House/apartment Stairs: No Has following equipment at home: Dan Humphreys - 4 wheeled, shower chair, bed side commode, and Ramped entry  OCCUPATION: Retired  PATIENT GOALS: "I just want to get stronger and not be susceptible to losing balance"  NEXT MD VISIT: n/a  OBJECTIVE: (Measures in this section from initial evaluation unless otherwise noted)  DIAGNOSTIC FINDINGS: 08/21/22 hip x-ray demos ORIF  PATIENT SURVEYS:  Eval:  FOTO 40; predicted 53 02/11/2023:  FOTO 59 (goal met)  EDEMA:  Notes a little R posterior thigh swelling  MUSCLE  LENGTH: Hamstrings: Right ~70 deg; Left ~70 deg Thomas test: Right -10 deg; Left 10 deg  POSTURE: flexed trunk   LOWER EXTREMITY ROM:  Active/Passive ROM Right eval Left eval Right 01/09/23  Hip flexion 80/100 125/125 100/125    LOWER EXTREMITY MMT:  MMT Right eval Left eval Right 02/09/23  Hip flexion 3-* 5 3+ within available range   Hip extension 3- 3+ 3  Hip abduction 2+ 3- 3  Hip adduction     Hip internal rotation     Hip external rotation     Knee flexion 4+ 5 5  Knee extension 3+ 5 4  Ankle dorsiflexion     Ankle plantarflexion     Ankle inversion     Ankle eversion      (Blank rows = not tested) * = concordant pain  FUNCTIONAL TESTS:  Eval: 5 times sit to stand: 12.95 sec Berg Balance test: 40/56  01/23/23 5 times sit<>stand: 10 sec Berg Balance test: 47/56  02/11/2023: 5 times sit to stand: 10.94 sec without UE Timed Up and Go (TUG):  12.24 sec with 4WRW 3 minute walk test:  481 ft with RPE of 8/10  GAIT: Distance walked: 100' Assistive device utilized: Environmental consultant - 4 wheeled  Level of assistance: Modified independence Comments: Reciprocal gait, flexed trunk   TODAY'S TREATMENT:    DATE: 02/25/2023 Nustep level 3 x 5 min with PT present to discuss status Supine quad sets x 20 (right) Supine hip abduction 2 x 10 right  Supine heel slides 2 x 10 right Supine SLR (right) vc's to "lock and lift" encouraging terminal knee extension to avoid heel lag Hook lying SAQ 2 x 10 with  2 1/2# right Left side lying single leg clam 2 x 10, then hip abduction 2 x 10 Trigger Point Dry-Needling  Treatment instructions: Expect mild to moderate muscle soreness. S/S of pneumothorax if dry needled over a lung field, and to seek immediate medical attention should they occur. Patient verbalized understanding of these instructions and education. Patient Consent Given: Yes Education handout provided: Yes Muscles treated: right medial and  lateral quad  Electrical stimulation  performed: No Parameters: N/A Treatment response/outcome: Skilled palpation used to identify taut bands and trigger points.  Once identified, dry needling techniques used to treat these areas.  Twitch response ellicited along with palpable elongation of muscle.  Following treatment, patient reported significant relief of pain and tension in the muscle and demonstrated less antalgic gait.   DATE: 02/19/2023 Nustep level 3 x6 min with PT present to discuss status Supine quad sets x 20 (right) Supine hip abduction 2 x 10 right  Supine SLR (right) Supine heel slides 2 x 10 right Hook lying SAQ 2 x 10 0# right Trigger Point Dry-Needling  Treatment instructions: Expect mild to moderate muscle soreness. S/S of pneumothorax if dry needled over a lung field, and to seek immediate medical attention should they occur. Patient verbalized understanding of these instructions and education. Patient Consent Given: Yes Education handout provided: Yes Muscles treated: right lateral quad  Electrical stimulation performed: No Parameters: N/A Treatment response/outcome: Skilled palpation used to identify taut bands and trigger points.  Once identified, dry needling techniques used to treat these areas.  Twitch response ellicited along with palpable elongation of muscle.  Following treatment, patient reported significant relief of pain and tension in the muscle.    DATE: 02/11/2023 Nustep level 3 x6 min with PT present to discuss status Sit to/from stand x5, TUG FOTO 3 min ambulation around PT gyms for 481 ft with RPE of 8/10 reported Seated hamstring stretch 2x20 sec bilat Seated piriformis stretch 2x20 sec bilat Seated with 2# on ankles:  hip flexion up and over a low floor disc 2x10 bilat Seated with 2# on ankles:  LAQ and hip ER.  2x10 each bilat   PATIENT EDUCATION:  Education details: Exam findings, POC, initial HEP Person educated: Patient Education method: Explanation, Demonstration, and  Handouts Education comprehension: verbalized understanding, returned demonstration, and needs further education  HOME EXERCISE PROGRAM: Access Code: [email or web address] URL: [email or web address] Date: 02/19/2023 Prepared by: Mikey Kirschner  Exercises - Figure 4 Bridge (Mirrored)  - 1 x daily - 7 x weekly - 2 sets - 10 reps - Standing Hamstring Curl with Resistance  - 1 x daily - 7 x weekly - 2 sets - 10 reps - Sidelying Bent Knee Hip Flexion  - 1 x daily - 7 x weekly - 2 sets - 10 reps - Standing 'L' Stretch at Counter  - 1 x daily - 7 x weekly - 2 sets - 30 sec hold - Standing Lumbar Extension with Counter  - 1 x daily - 7 x weekly - 1 sets - 5 reps - 5 sec hold -  Seated Piriformis Stretch  - 1 x daily - 7 x weekly - 2 sets - 30 sec hold - Seated Piriformis Stretch  - 1 x daily - 7 x weekly - 2 sets - 30 sec hold - Sidelying Hip Abduction  - 1 x daily - 7 x weekly - 2 sets - 10 reps - Prone Hip Extension - Two Pillows  - 1 x daily - 7 x weekly - 2 sets - 10 reps - Seated Straight Leg Raise with Support  - 1 x daily - 7 x weekly - 2 sets - 10 reps - Quadricep Stretch with Chair and Counter Support  - 1 x daily - 7 x weekly - 1 sets - 3 reps - 30 sec hold  ASSESSMENT:  CLINICAL IMPRESSION: Ms Lacko is progressing appropriately.  She responded well to DN again today reporting significant reduction in pain.  She is extremely tight and weak with hip ER and hip abduction.  We will need to continue focusing on this.   She would benefit from continued skilled PT for right hip stretching and strengthening along with repeating DN as indicated.    OBJECTIVE IMPAIRMENTS: Abnormal gait, decreased activity tolerance, decreased balance, decreased endurance, decreased mobility, difficulty walking, decreased ROM, decreased strength, hypomobility, increased fascial restrictions, impaired flexibility, improper body mechanics, postural dysfunction, and pain.   GOALS: Goals reviewed with patient?  Yes  SHORT TERM GOALS: Target date: 01/12/2023  Pt will be ind with initial HEP Baseline: Goal status: MET  2.  PT will assess 5x STS and set goals accordingly Baseline:  Goal status: MET  3.  PT will assess Berg Balance and set goals accordingly Baseline:  Goal status: MET  4.  Pt will be able to improve R hip flexion to at least 100 deg in sitting Baseline:  01/23/23: 100 deg AROM after stretching Goal status: MET   LONG TERM GOALS: Target date: 04/03/2023    Pt will be ind with management and progression of HEP Baseline:  Goal status: IN PROGRESS  2.  Pt will have 5x STS of </=10 sec to demo decreased risk of falls and improved functional LE strength Baseline: 12.96 sec 01/23/23: 10 sec Goal status: IN PROGRESS  3.  Pt will have improved Berg Balance Score by at least 8 points to demo MCID for decreased fall risk Baseline: 40/56 01/23/23: 47/56 Goal status: IN PROGRESS  4.  Pt will be able to perform household amb (at least 400') without a/d  Baseline:  02/09/23: Walking with cane only when someone is at home for supervision; otherwise walking with rollator x10 min twice a day Goal status: IN PROGRESS  5.  Pt will be able to tolerate standing for at least 20 minutes to be able to cook/wash dishes Baseline:  Goal status: MET 02/09/23  6.  Pt will have improved FOTO to >/=53 Baseline: 02/09/23: 46 Goal status: MET on 02/11/2023   PLAN:  PT FREQUENCY: 2x/week  PT DURATION: 8 weeks  PLANNED INTERVENTIONS: Therapeutic exercises, Therapeutic activity, Neuromuscular re-education, Balance training, Gait training, Patient/Family education, Self Care, Joint mobilization, Stair training, Aquatic Therapy, Cryotherapy, Moist heat, Taping, Ionotophoresis 4mg /ml Dexamethasone, Manual therapy, and Re-evaluation  PLAN FOR NEXT SESSION:  Continue to work on hip ROM and strengthening especially with hip ER and hip abduction.  Emphasize flexibility.  Work on balance and weight  shifting. DN as indicated for right quad and abductors.     Victorino Dike B. Mithcell Schumpert, PT 02/25/23 11:44 AM  [facility] [address] [place], [place] [address] Phone # [phone] Fax [phone]

## 2023-02-27 ENCOUNTER — Ambulatory Visit: Payer: Medicare Other | Admitting: Physical Therapy

## 2023-02-27 DIAGNOSIS — M25551 Pain in right hip: Secondary | ICD-10-CM

## 2023-02-27 DIAGNOSIS — M6281 Muscle weakness (generalized): Secondary | ICD-10-CM | POA: Diagnosis not present

## 2023-02-27 DIAGNOSIS — M25651 Stiffness of right hip, not elsewhere classified: Secondary | ICD-10-CM

## 2023-02-27 DIAGNOSIS — R2689 Other abnormalities of gait and mobility: Secondary | ICD-10-CM

## 2023-02-27 DIAGNOSIS — R252 Cramp and spasm: Secondary | ICD-10-CM | POA: Diagnosis not present

## 2023-02-27 DIAGNOSIS — R262 Difficulty in walking, not elsewhere classified: Secondary | ICD-10-CM

## 2023-02-27 NOTE — Therapy (Signed)
OUTPATIENT PHYSICAL THERAPY TREATMENT AND REASSESSMENT NOTE   Patient Name: Carrie Barnes MRN: [id number] DOB:April 18, 1936, 87 y.o., female Today's Date: 02/27/2023  END OF SESSION:  PT End of Session - 02/27/23 0939     Visit Number 15    Date for PT Re-Evaluation 04/03/23    Authorization Type BCBS Medicare    Progress Note Due on Visit 20    PT Start Time 0932    PT Stop Time 1015    PT Time Calculation (min) 43 min    Activity Tolerance Patient tolerated treatment well    Behavior During Therapy [facility] for tasks assessed/performed              Past Medical History:  Diagnosis Date   Cholecystitis    Coronary artery disease    a. 03/2017: 80-90% LAD stenosis (PCI/DES placement with a 2.75x16 mm Promus Premier stent). No significant stenosis along RCA or LCx.    DJD (degenerative joint disease)    GERD (gastroesophageal reflux disease)    Hyperlipidemia    Hypertension    dx s/p MI    Ischemic cardiomyopathy    Myocardial infarction (HCC) 03/17/2017   03-2017 treated at [facility]    Osteoporosis    Status post insertion of drug-eluting stent into left anterior descending (LAD) artery 03/2017   Past Surgical History:  Procedure Laterality Date   APPENDECTOMY     CESAREAN SECTION     CHOLECYSTECTOMY N/A 11/05/2017   Procedure: LAPAROSCOPIC CHOLECYSTECTOMY WITH INTRAOPERATIVE CHOLANGIOGRAM;  Surgeon: Darnell Level, MD;  Location: [facility];  Service: General;  Laterality: N/A;   ERCP N/A 11/11/2017   Procedure: ENDOSCOPIC RETROGRADE CHOLANGIOPANCREATOGRAPHY (ERCP);  Surgeon: Willis Modena, MD;  Location: [facility];  Service: Endoscopy;  Laterality: N/A;  possible   EUS N/A 11/11/2017   Procedure: UPPER ENDOSCOPIC ULTRASOUND (EUS) RADIAL;  Surgeon: Willis Modena, MD;  Location: [facility];  Service: Endoscopy;  Laterality: N/A;   FEMUR IM NAIL Right 07/13/2019   Procedure: INTRAMEDULLARY (IM) RETROGRADE FEMORAL NAILING;  Surgeon: Ollen Gross,  MD;  Location: [facility];  Service: Orthopedics;  Laterality: Right;    LUMBAR LAMINECTOMY/ DECOMPRESSION WITH MET-RX     OPEN REDUCTION INTERNAL FIXATION ACETABULAR FRACTURE STOPPA Right 05/14/2022   Procedure: OPEN REDUCTION INTERNAL FIXATION RIGHT ACETABULUM;  Surgeon: Roby Lofts, MD;  Location: [facility] OR;  Service: Orthopedics;  Laterality: Right;   ORIF FEMUR FRACTURE Right 08/21/2022   Procedure: RIGHT OPEN REDUCTION INTERNAL FIXATION (ORIF) DISTAL FEMUR FRACTURE;  Surgeon: Roby Lofts, MD;  Location: [facility] OR;  Service: Orthopedics;  Laterality: Right;   REVERSE SHOULDER ARTHROPLASTY Right 05/15/2022   Procedure: RIGHT REVERSE SHOULDER ARTHROPLASTY;  Surgeon: Yolonda Kida, MD;  Location: [facility] OR;  Service: Orthopedics;  Laterality: Right;   SHOULDER ARTHROSCOPY WITH ROTATOR CUFF REPAIR AND SUBACROMIAL DECOMPRESSION Right 09/20/2021   Procedure: SHOULDER MINI OPEN ROTATOR CUFF REPAIR AND SUBACROMIAL DECOMPRESSION WITH POSSIBLE PATCH GRAFT;  Surgeon: Jene Every, MD;  Location: [facility];  Service: Orthopedics;  Laterality: Right;  90 MINS   stent  Left 03/2017   anterior descending ; drug eluting ; tx of MI at [facility]    TONSILLECTOMY     VAGINAL HYSTERECTOMY     vaginal sling     Patient Active Problem List   Diagnosis Date Noted   Closed bicondylar fracture of distal femur (HCC) 08/20/2022   Hyponatremia 08/20/2022   Hypomagnesemia 05/13/2022   Pelvis fracture (HCC) 05/12/2022  Humeral head fracture, right, closed, initial encounter 05/12/2022   Elevated troponin 05/12/2022   Hematoma 05/12/2022   Hypertension 11/05/2021   Complete rotator cuff tear 09/20/2021   Closed intertrochanteric fracture of right hip (HCC) 07/11/2019   Closed right hip fracture (HCC) 07/11/2019   Chronic cholecystitis due to cholelithiasis with choledocholithiasis 11/05/2017   Cholelithiasis with chronic cholecystitis 11/01/2017   Cholelithiasis 05/18/2017   CAD in native  artery 03/31/2017   Ischemic cardiomyopathy 03/31/2017   Hyperlipemia 03/31/2017   DJD (degenerative joint disease) 03/31/2017   Osteoporosis 03/31/2017    PCP: Renford Dills, MD  REFERRING PROVIDER: Dion Saucier D, PA  REFERRING DIAG: ORIF R Distal Femur FX 08/21/22 ORIF R Acetabulum FX 05/14/22  THERAPY DIAG:  Muscle weakness (generalized)  Pain in right hip  Stiffness of right hip, not elsewhere classified  Difficulty in walking, not elsewhere classified  Cramp and spasm  Other abnormalities of gait and mobility  Rationale for Evaluation and Treatment: Rehabilitation  ONSET DATE: R hip ORIF 08/21/22  SUBJECTIVE:   SUBJECTIVE STATEMENT: Patient reports she had about 3-4 days of relief then "it started tightening up again".  She admits she wasn't feeling well at this week and she has been sitting more.    PERTINENT HISTORY: From H&P: 87 yo female with history of CAD, ischemic cardiomyopathy, paroxysmal atrial fibrillation, HTN, HLD, PACs and spinal stenosis 3rd R hip fx, R shoulder fx  PAIN:  Are you having pain? Yes: NPRS scale: 6/10 Pain location: R front thigh and knee Pain description: dull Aggravating factors: standing/walking Relieving factors: rest  PRECAUTIONS: None  WEIGHT BEARING RESTRICTIONS: No  FALLS:  Has patient fallen in last 6 months? Yes. Number of falls 2  LIVING ENVIRONMENT: Lives with: lives alone Daughter lives close; family checks in daily Lives in: House/apartment Stairs: No Has following equipment at home: Dan Humphreys - 4 wheeled, shower chair, bed side commode, and Ramped entry  OCCUPATION: Retired  PATIENT GOALS: "I just want to get stronger and not be susceptible to losing balance"  NEXT MD VISIT: n/a  OBJECTIVE: (Measures in this section from initial evaluation unless otherwise noted)  DIAGNOSTIC FINDINGS: 08/21/22 hip x-ray demos ORIF  PATIENT SURVEYS:  Eval:  FOTO 40; predicted 53 02/11/2023:  FOTO 59 (goal met)  EDEMA:   Notes a little R posterior thigh swelling  MUSCLE LENGTH: Hamstrings: Right ~70 deg; Left ~70 deg Thomas test: Right -10 deg; Left 10 deg  POSTURE: flexed trunk   LOWER EXTREMITY ROM:  Active/Passive ROM Right eval Left eval Right 01/09/23  Hip flexion 80/100 125/125 100/125    LOWER EXTREMITY MMT:  MMT Right eval Left eval Right 02/09/23  Hip flexion 3-* 5 3+ within available range   Hip extension 3- 3+ 3  Hip abduction 2+ 3- 3  Hip adduction     Hip internal rotation     Hip external rotation     Knee flexion 4+ 5 5  Knee extension 3+ 5 4  Ankle dorsiflexion     Ankle plantarflexion     Ankle inversion     Ankle eversion      (Blank rows = not tested) * = concordant pain  FUNCTIONAL TESTS:  Eval: 5 times sit to stand: 12.95 sec Berg Balance test: 40/56  01/23/23 5 times sit<>stand: 10 sec Berg Balance test: 47/56  02/11/2023: 5 times sit to stand: 10.94 sec without UE Timed Up and Go (TUG):  12.24 sec with 4WRW 3 minute walk test:  481  ft with RPE of 8/10  GAIT: Distance walked: 100' Assistive device utilized: Environmental consultant - 4 wheeled Level of assistance: Modified independence Comments: Reciprocal gait, flexed trunk   TODAY'S TREATMENT:    DATE: 02/27/2023 Nustep L5 x 5 min Prone quad stretch with strap 2x1 min Prone hip ext 3x10 Prone hamstring curl 2# 3x10 Prone hip ER 2# 2x10 Prone hip abd 2# 3x10 Sit<>stand 5# KB 2x10 Sit<>stand staggered stance with R LE back x5 Staggered stance rock forward/backward 2x10 Standing with equal weight shift + diagonal chops 2# med ball 2x10 Alternating step tap 4" step 2x10  DATE: 02/25/2023 Nustep level 3 x 5 min with PT present to discuss status Supine quad sets x 20 (right) Supine hip abduction 2 x 10 right  Supine heel slides 2 x 10 right Supine SLR (right) vc's to "lock and lift" encouraging terminal knee extension to avoid heel lag Hook lying SAQ 2 x 10 with  2 1/2# right Left side lying single leg clam 2  x 10, then hip abduction 2 x 10 Trigger Point Dry-Needling  Treatment instructions: Expect mild to moderate muscle soreness. S/S of pneumothorax if dry needled over a lung field, and to seek immediate medical attention should they occur. Patient verbalized understanding of these instructions and education. Patient Consent Given: Yes Education handout provided: Yes Muscles treated: right medial and  lateral quad  Electrical stimulation performed: No Parameters: N/A Treatment response/outcome: Skilled palpation used to identify taut bands and trigger points.  Once identified, dry needling techniques used to treat these areas.  Twitch response ellicited along with palpable elongation of muscle.  Following treatment, patient reported significant relief of pain and tension in the muscle and demonstrated less antalgic gait.   DATE: 02/19/2023 Nustep level 3 x6 min with PT present to discuss status Supine quad sets x 20 (right) Supine hip abduction 2 x 10 right  Supine SLR (right) Supine heel slides 2 x 10 right Hook lying SAQ 2 x 10 0# right Trigger Point Dry-Needling  Treatment instructions: Expect mild to moderate muscle soreness. S/S of pneumothorax if dry needled over a lung field, and to seek immediate medical attention should they occur. Patient verbalized understanding of these instructions and education. Patient Consent Given: Yes Education handout provided: Yes Muscles treated: right lateral quad  Electrical stimulation performed: No Parameters: N/A Treatment response/outcome: Skilled palpation used to identify taut bands and trigger points.  Once identified, dry needling techniques used to treat these areas.  Twitch response ellicited along with palpable elongation of muscle.  Following treatment, patient reported significant relief of pain and tension in the muscle.    DATE: 02/11/2023 Nustep level 3 x6 min with PT present to discuss status Sit to/from stand x5, TUG FOTO 3 min  ambulation around PT gyms for 481 ft with RPE of 8/10 reported Seated hamstring stretch 2x20 sec bilat Seated piriformis stretch 2x20 sec bilat Seated with 2# on ankles:  hip flexion up and over a low floor disc 2x10 bilat Seated with 2# on ankles:  LAQ and hip ER.  2x10 each bilat   PATIENT EDUCATION:  Education details: Exam findings, POC, initial HEP Person educated: Patient Education method: Explanation, Demonstration, and Handouts Education comprehension: verbalized understanding, returned demonstration, and needs further education  HOME EXERCISE PROGRAM: Access Code: [email or web address] URL: [email or web address] Date: 02/19/2023 Prepared by: Mikey Kirschner  Exercises - Figure 4 Bridge (Mirrored)  - 1 x daily - 7 x weekly - 2 sets - 10 reps - Standing Hamstring Curl  with Resistance  - 1 x daily - 7 x weekly - 2 sets - 10 reps - Sidelying Bent Knee Hip Flexion  - 1 x daily - 7 x weekly - 2 sets - 10 reps - Standing 'L' Stretch at Counter  - 1 x daily - 7 x weekly - 2 sets - 30 sec hold - Standing Lumbar Extension with Counter  - 1 x daily - 7 x weekly - 1 sets - 5 reps - 5 sec hold - Seated Piriformis Stretch  - 1 x daily - 7 x weekly - 2 sets - 30 sec hold - Seated Piriformis Stretch  - 1 x daily - 7 x weekly - 2 sets - 30 sec hold - Sidelying Hip Abduction  - 1 x daily - 7 x weekly - 2 sets - 10 reps - Prone Hip Extension - Two Pillows  - 1 x daily - 7 x weekly - 2 sets - 10 reps - Seated Straight Leg Raise with Support  - 1 x daily - 7 x weekly - 2 sets - 10 reps - Quadricep Stretch with Chair and Counter Support  - 1 x daily - 7 x weekly - 1 sets - 3 reps - 30 sec hold  ASSESSMENT:  CLINICAL IMPRESSION: Treatment focused on improving R LE weight shift and decreasing R quad stiffness. Pt notes good improvement with quad stiffness with just stretching today. Continued hip strengthening as pt tolerated. Pt still tends to shift more weight to the left with standing.    OBJECTIVE IMPAIRMENTS: Abnormal gait, decreased activity tolerance, decreased balance, decreased endurance, decreased mobility, difficulty walking, decreased ROM, decreased strength, hypomobility, increased fascial restrictions, impaired flexibility, improper body mechanics, postural dysfunction, and pain.   GOALS: Goals reviewed with patient? Yes  SHORT TERM GOALS: Target date: 01/12/2023  Pt will be ind with initial HEP Baseline: Goal status: MET  2.  PT will assess 5x STS and set goals accordingly Baseline:  Goal status: MET  3.  PT will assess Berg Balance and set goals accordingly Baseline:  Goal status: MET  4.  Pt will be able to improve R hip flexion to at least 100 deg in sitting Baseline:  01/23/23: 100 deg AROM after stretching Goal status: MET   LONG TERM GOALS: Target date: 04/03/2023    Pt will be ind with management and progression of HEP Baseline:  Goal status: IN PROGRESS  2.  Pt will have 5x STS of </=10 sec to demo decreased risk of falls and improved functional LE strength Baseline: 12.96 sec 01/23/23: 10 sec Goal status: IN PROGRESS  3.  Pt will have improved Berg Balance Score by at least 8 points to demo MCID for decreased fall risk Baseline: 40/56 01/23/23: 47/56 Goal status: IN PROGRESS  4.  Pt will be able to perform household amb (at least 400') without a/d  Baseline:  02/09/23: Walking with cane only when someone is at home for supervision; otherwise walking with rollator x10 min twice a day Goal status: IN PROGRESS  5.  Pt will be able to tolerate standing for at least 20 minutes to be able to cook/wash dishes Baseline:  Goal status: MET 02/09/23  6.  Pt will have improved FOTO to >/=53 Baseline: 02/09/23: 46 Goal status: MET on 02/11/2023   PLAN:  PT FREQUENCY: 2x/week  PT DURATION: 8 weeks  PLANNED INTERVENTIONS: Therapeutic exercises, Therapeutic activity, Neuromuscular re-education, Balance training, Gait training, Patient/Family  education, Self Care, Joint mobilization, Stair training,  Aquatic Therapy, Cryotherapy, Moist heat, Taping, Ionotophoresis 4mg /ml Dexamethasone, Manual therapy, and Re-evaluation  PLAN FOR NEXT SESSION:  Continue to work on hip ROM and strengthening especially with hip ER and hip abduction.  Emphasize flexibility.  Work on balance and weight shifting. DN as indicated for right quad and abductors.     [facility] April Randalyn Rhea [place], [place] 02/27/23 9:39 AM  [facility] [address] [place], [place] [address] Phone # [phone] Fax [phone]

## 2023-03-03 ENCOUNTER — Ambulatory Visit: Payer: Medicare Other

## 2023-03-03 DIAGNOSIS — M6281 Muscle weakness (generalized): Secondary | ICD-10-CM

## 2023-03-03 DIAGNOSIS — M25551 Pain in right hip: Secondary | ICD-10-CM | POA: Diagnosis not present

## 2023-03-03 DIAGNOSIS — R262 Difficulty in walking, not elsewhere classified: Secondary | ICD-10-CM | POA: Diagnosis not present

## 2023-03-03 DIAGNOSIS — M25651 Stiffness of right hip, not elsewhere classified: Secondary | ICD-10-CM | POA: Diagnosis not present

## 2023-03-03 DIAGNOSIS — R2689 Other abnormalities of gait and mobility: Secondary | ICD-10-CM | POA: Diagnosis not present

## 2023-03-03 DIAGNOSIS — R252 Cramp and spasm: Secondary | ICD-10-CM | POA: Diagnosis not present

## 2023-03-03 NOTE — Therapy (Signed)
OUTPATIENT PHYSICAL THERAPY TREATMENT AND REASSESSMENT NOTE   Patient Name: Carrie Barnes MRN: [id number] DOB:1935-10-27, 87 y.o., female Today's Date: 03/03/2023  END OF SESSION:  PT End of Session - 03/03/23 1616     Visit Number 16    Date for PT Re-Evaluation 04/03/23    Authorization Type BCBS Medicare    Progress Note Due on Visit 20    PT Start Time 1616    PT Stop Time 1700    PT Time Calculation (min) 44 min    Activity Tolerance Patient tolerated treatment well    Behavior During Therapy [facility] for tasks assessed/performed              Past Medical History:  Diagnosis Date   Cholecystitis    Coronary artery disease    a. 03/2017: 80-90% LAD stenosis (PCI/DES placement with a 2.75x16 mm Promus Premier stent). No significant stenosis along RCA or LCx.    DJD (degenerative joint disease)    GERD (gastroesophageal reflux disease)    Hyperlipidemia    Hypertension    dx s/p MI    Ischemic cardiomyopathy    Myocardial infarction (HCC) 03/17/2017   03-2017 treated at [facility]    Osteoporosis    Status post insertion of drug-eluting stent into left anterior descending (LAD) artery 03/2017   Past Surgical History:  Procedure Laterality Date   APPENDECTOMY     CESAREAN SECTION     CHOLECYSTECTOMY N/A 11/05/2017   Procedure: LAPAROSCOPIC CHOLECYSTECTOMY WITH INTRAOPERATIVE CHOLANGIOGRAM;  Surgeon: Darnell Level, MD;  Location: [facility];  Service: General;  Laterality: N/A;   ERCP N/A 11/11/2017   Procedure: ENDOSCOPIC RETROGRADE CHOLANGIOPANCREATOGRAPHY (ERCP);  Surgeon: Willis Modena, MD;  Location: [facility];  Service: Endoscopy;  Laterality: N/A;  possible   EUS N/A 11/11/2017   Procedure: UPPER ENDOSCOPIC ULTRASOUND (EUS) RADIAL;  Surgeon: Willis Modena, MD;  Location: [facility];  Service: Endoscopy;  Laterality: N/A;   FEMUR IM NAIL Right 07/13/2019   Procedure: INTRAMEDULLARY (IM) RETROGRADE FEMORAL NAILING;  Surgeon: Ollen Gross,  MD;  Location: [facility];  Service: Orthopedics;  Laterality: Right;    LUMBAR LAMINECTOMY/ DECOMPRESSION WITH MET-RX     OPEN REDUCTION INTERNAL FIXATION ACETABULAR FRACTURE STOPPA Right 05/14/2022   Procedure: OPEN REDUCTION INTERNAL FIXATION RIGHT ACETABULUM;  Surgeon: Roby Lofts, MD;  Location: [facility];  Service: Orthopedics;  Laterality: Right;   ORIF FEMUR FRACTURE Right 08/21/2022   Procedure: RIGHT OPEN REDUCTION INTERNAL FIXATION (ORIF) DISTAL FEMUR FRACTURE;  Surgeon: Roby Lofts, MD;  Location: [facility];  Service: Orthopedics;  Laterality: Right;   REVERSE SHOULDER ARTHROPLASTY Right 05/15/2022   Procedure: RIGHT REVERSE SHOULDER ARTHROPLASTY;  Surgeon: Yolonda Kida, MD;  Location: [facility] OR;  Service: Orthopedics;  Laterality: Right;   SHOULDER ARTHROSCOPY WITH ROTATOR CUFF REPAIR AND SUBACROMIAL DECOMPRESSION Right 09/20/2021   Procedure: SHOULDER MINI OPEN ROTATOR CUFF REPAIR AND SUBACROMIAL DECOMPRESSION WITH POSSIBLE PATCH GRAFT;  Surgeon: Jene Every, MD;  Location: [facility];  Service: Orthopedics;  Laterality: Right;  90 MINS   stent  Left 03/2017   anterior descending ; drug eluting ; tx of MI at [facility]    TONSILLECTOMY     VAGINAL HYSTERECTOMY     vaginal sling     Patient Active Problem List   Diagnosis Date Noted   Closed bicondylar fracture of distal femur (HCC) 08/20/2022   Hyponatremia 08/20/2022   Hypomagnesemia 05/13/2022   Pelvis fracture (HCC) 05/12/2022  Humeral head fracture, right, closed, initial encounter 05/12/2022   Elevated troponin 05/12/2022   Hematoma 05/12/2022   Hypertension 11/05/2021   Complete rotator cuff tear 09/20/2021   Closed intertrochanteric fracture of right hip (HCC) 07/11/2019   Closed right hip fracture (HCC) 07/11/2019   Chronic cholecystitis due to cholelithiasis with choledocholithiasis 11/05/2017   Cholelithiasis with chronic cholecystitis 11/01/2017   Cholelithiasis 05/18/2017   CAD in native  artery 03/31/2017   Ischemic cardiomyopathy 03/31/2017   Hyperlipemia 03/31/2017   DJD (degenerative joint disease) 03/31/2017   Osteoporosis 03/31/2017    PCP: Renford Dills, MD  REFERRING PROVIDER: Dion Saucier D, PA  REFERRING DIAG: ORIF R Distal Femur FX 08/21/22 ORIF R Acetabulum FX 05/14/22  THERAPY DIAG:  Muscle weakness (generalized)  Pain in right hip  Stiffness of right hip, not elsewhere classified  Difficulty in walking, not elsewhere classified  Cramp and spasm  Rationale for Evaluation and Treatment: Rehabilitation  ONSET DATE: R hip ORIF 08/21/22  SUBJECTIVE:   SUBJECTIVE STATEMENT: Patient reports she had good response from the DN but she feels like she had even better response to lying on her stomach and stretching the front of her leg.  I do pretty well after my exercises but the right thigh seems to tighten back up after a while.  Overall, its a lot better.    PERTINENT HISTORY: From H&P: 87 yo female with history of CAD, ischemic cardiomyopathy, paroxysmal atrial fibrillation, HTN, HLD, PACs and spinal stenosis 3rd R hip fx, R shoulder fx  PAIN:  Are you having pain? Yes: NPRS scale: 6/10 Pain location: R front thigh and knee Pain description: dull Aggravating factors: standing/walking Relieving factors: rest  PRECAUTIONS: None  WEIGHT BEARING RESTRICTIONS: No  FALLS:  Has patient fallen in last 6 months? Yes. Number of falls 2  LIVING ENVIRONMENT: Lives with: lives alone Daughter lives close; family checks in daily Lives in: House/apartment Stairs: No Has following equipment at home: Dan Humphreys - 4 wheeled, shower chair, bed side commode, and Ramped entry  OCCUPATION: Retired  PATIENT GOALS: "I just want to get stronger and not be susceptible to losing balance"  NEXT MD VISIT: n/a  OBJECTIVE: (Measures in this section from initial evaluation unless otherwise noted)  DIAGNOSTIC FINDINGS: 08/21/22 hip x-ray demos ORIF  PATIENT SURVEYS:   Eval:  FOTO 40; predicted 53 02/11/2023:  FOTO 59 (goal met)  EDEMA:  Notes a little R posterior thigh swelling  MUSCLE LENGTH: Hamstrings: Right ~70 deg; Left ~70 deg Thomas test: Right -10 deg; Left 10 deg  POSTURE: flexed trunk   LOWER EXTREMITY ROM:  Active/Passive ROM Right eval Left eval Right 01/09/23  Hip flexion 80/100 125/125 100/125    LOWER EXTREMITY MMT:  MMT Right eval Left eval Right 02/09/23  Hip flexion 3-* 5 3+ within available range   Hip extension 3- 3+ 3  Hip abduction 2+ 3- 3  Hip adduction     Hip internal rotation     Hip external rotation     Knee flexion 4+ 5 5  Knee extension 3+ 5 4  Ankle dorsiflexion     Ankle plantarflexion     Ankle inversion     Ankle eversion      (Blank rows = not tested) * = concordant pain  FUNCTIONAL TESTS:  Eval: 5 times sit to stand: 12.95 sec Berg Balance test: 40/56  01/23/23 5 times sit<>stand: 10 sec Berg Balance test: 47/56  02/11/2023: 5 times sit to stand: 10.94 sec  without UE Timed Up and Go (TUG):  12.24 sec with 4WRW 3 minute walk test:  481 ft with RPE of 8/10  GAIT: Distance walked: 100' Assistive device utilized: Environmental consultant - 4 wheeled Level of assistance: Modified independence Comments: Reciprocal gait, flexed trunk   TODAY'S TREATMENT:   DATE: 03/03/2023 Nustep L5 x 5 min Prone quad stretch with strap 3x1 min (right) Prone hip ext 3x10 Prone hamstring curl 2# 3x10 Prone hip ER 2# 2x10 Prone hip abd 2# 3x10 Sit<>stand 5# KB 2x10 Sit<>stand staggered stance with R LE back x5 Left foot tap up onto balance pad x 10 fwd and then x 10 lateral with cga Alternating cone taps on floor without UE support with min assist (obvious left glut medius weakness on left during this exercise) Lateral band walks (length of low mat table for safety) green loop x 3 laps Side lying clam 2 x 10 with green loop  DATE: 02/27/2023 Nustep L5 x 5 min Prone quad stretch with strap 2x1 min Prone hip ext  3x10 Prone hamstring curl 2# 3x10 Prone hip ER 2# 2x10 Prone hip abd 2# 3x10 Seated LAQ 2 x 10 with 2# Sit<>stand 5# KB 2x10 Sit<>stand staggered stance with R LE back x5 Staggered stance rock forward/backward 2x10 Standing with equal weight shift + diagonal chops 2# med ball 2x10 Alternating step tap 4" step 2x10  DATE: 02/25/2023 Nustep level 3 x 5 min with PT present to discuss status Supine quad sets x 20 (right) Supine hip abduction 2 x 10 right  Supine heel slides 2 x 10 right Supine SLR (right) vc's to "lock and lift" encouraging terminal knee extension to avoid heel lag Hook lying SAQ 2 x 10 with  2 1/2# right Left side lying single leg clam 2 x 10, then hip abduction 2 x 10 Trigger Point Dry-Needling  Treatment instructions: Expect mild to moderate muscle soreness. S/S of pneumothorax if dry needled over a lung field, and to seek immediate medical attention should they occur. Patient verbalized understanding of these instructions and education. Patient Consent Given: Yes Education handout provided: Yes Muscles treated: right medial and  lateral quad  Electrical stimulation performed: No Parameters: N/A Treatment response/outcome: Skilled palpation used to identify taut bands and trigger points.  Once identified, dry needling techniques used to treat these areas.  Twitch response ellicited along with palpable elongation of muscle.  Following treatment, patient reported significant relief of pain and tension in the muscle and demonstrated less antalgic gait.   DATE: 02/19/2023 Nustep level 3 x6 min with PT present to discuss status Supine quad sets x 20 (right) Supine hip abduction 2 x 10 right  Supine SLR (right) Supine heel slides 2 x 10 right Hook lying SAQ 2 x 10 0# right Trigger Point Dry-Needling  Treatment instructions: Expect mild to moderate muscle soreness. S/S of pneumothorax if dry needled over a lung field, and to seek immediate medical attention should they  occur. Patient verbalized understanding of these instructions and education. Patient Consent Given: Yes Education handout provided: Yes Muscles treated: right lateral quad  Electrical stimulation performed: No Parameters: N/A Treatment response/outcome: Skilled palpation used to identify taut bands and trigger points.  Once identified, dry needling techniques used to treat these areas.  Twitch response ellicited along with palpable elongation of muscle.  Following treatment, patient reported significant relief of pain and tension in the muscle.    DATE: 02/11/2023 Nustep level 3 x6 min with PT present to discuss status Sit to/from  stand x5, TUG FOTO 3 min ambulation around PT gyms for 481 ft with RPE of 8/10 reported Seated hamstring stretch 2x20 sec bilat Seated piriformis stretch 2x20 sec bilat Seated with 2# on ankles:  hip flexion up and over a low floor disc 2x10 bilat Seated with 2# on ankles:  LAQ and hip ER.  2x10 each bilat   PATIENT EDUCATION:  Education details: Exam findings, POC, initial HEP Person educated: Patient Education method: Explanation, Demonstration, and Handouts Education comprehension: verbalized understanding, returned demonstration, and needs further education  HOME EXERCISE PROGRAM: Access Code: [email or web address] URL: [email or web address] Date: 02/19/2023 Prepared by: Mikey Kirschner  Exercises - Figure 4 Bridge (Mirrored)  - 1 x daily - 7 x weekly - 2 sets - 10 reps - Standing Hamstring Curl with Resistance  - 1 x daily - 7 x weekly - 2 sets - 10 reps - Sidelying Bent Knee Hip Flexion  - 1 x daily - 7 x weekly - 2 sets - 10 reps - Standing 'L' Stretch at Counter  - 1 x daily - 7 x weekly - 2 sets - 30 sec hold - Standing Lumbar Extension with Counter  - 1 x daily - 7 x weekly - 1 sets - 5 reps - 5 sec hold - Seated Piriformis Stretch  - 1 x daily - 7 x weekly - 2 sets - 30 sec hold - Seated Piriformis Stretch  - 1 x daily - 7 x weekly - 2 sets -  30 sec hold - Sidelying Hip Abduction  - 1 x daily - 7 x weekly - 2 sets - 10 reps - Prone Hip Extension - Two Pillows  - 1 x daily - 7 x weekly - 2 sets - 10 reps - Seated Straight Leg Raise with Support  - 1 x daily - 7 x weekly - 2 sets - 10 reps - Quadricep Stretch with Chair and Counter Support  - 1 x daily - 7 x weekly - 1 sets - 3 reps - 30 sec hold  ASSESSMENT:  CLINICAL IMPRESSION: Nolah is beginning to gain some length in the right hip flexor and quad.  She was able to tolerate prone position for all exercises in this position without rest.  She demonstrates some weakness in the left glut med when trying to stand on right LE.  We will begin to work on this more in the next few visits.  Overall, she is making good progress.  She would benefit from continuing skilled PT to focus on bilateral hip strength, and flexibility along with gait training to restore functional safe gait.    OBJECTIVE IMPAIRMENTS: Abnormal gait, decreased activity tolerance, decreased balance, decreased endurance, decreased mobility, difficulty walking, decreased ROM, decreased strength, hypomobility, increased fascial restrictions, impaired flexibility, improper body mechanics, postural dysfunction, and pain.   GOALS: Goals reviewed with patient? Yes  SHORT TERM GOALS: Target date: 01/12/2023  Pt will be ind with initial HEP Baseline: Goal status: MET  2.  PT will assess 5x STS and set goals accordingly Baseline:  Goal status: MET  3.  PT will assess Berg Balance and set goals accordingly Baseline:  Goal status: MET  4.  Pt will be able to improve R hip flexion to at least 100 deg in sitting Baseline:  01/23/23: 100 deg AROM after stretching Goal status: MET   LONG TERM GOALS: Target date: 04/03/2023    Pt will be ind with management and progression of HEP Baseline:  Goal status: IN PROGRESS  2.  Pt will have 5x STS of </=10 sec to demo decreased risk of falls and improved functional LE  strength Baseline: 12.96 sec 01/23/23: 10 sec Goal status: IN PROGRESS  3.  Pt will have improved Berg Balance Score by at least 8 points to demo MCID for decreased fall risk Baseline: 40/56 01/23/23: 47/56 Goal status: IN PROGRESS  4.  Pt will be able to perform household amb (at least 400') without a/d  Baseline:  02/09/23: Walking with cane only when someone is at home for supervision; otherwise walking with rollator x10 min twice a day Goal status: IN PROGRESS  5.  Pt will be able to tolerate standing for at least 20 minutes to be able to cook/wash dishes Baseline:  Goal status: MET 02/09/23  6.  Pt will have improved FOTO to >/=53 Baseline: 02/09/23: 46 Goal status: MET on 02/11/2023   PLAN:  PT FREQUENCY: 2x/week  PT DURATION: 8 weeks  PLANNED INTERVENTIONS: Therapeutic exercises, Therapeutic activity, Neuromuscular re-education, Balance training, Gait training, Patient/Family education, Self Care, Joint mobilization, Stair training, Aquatic Therapy, Cryotherapy, Moist heat, Taping, Ionotophoresis 4mg /ml Dexamethasone, Manual therapy, and Re-evaluation  PLAN FOR NEXT SESSION:  Add in left glut med strengthening, Continue to work on hip ROM and strengthening especially with hip ER and hip abduction.  Emphasize flexibility.  Work on balance and weight shifting. DN as indicated for right quad and abductors.     Victorino Dike B. Allister Lessley, PT 03/03/23 5:15 PM   [facility] [address] [place], [place] [address] Phone # [phone] Fax [phone]

## 2023-03-06 ENCOUNTER — Ambulatory Visit: Payer: Medicare Other | Admitting: Physical Therapy

## 2023-03-06 DIAGNOSIS — M25651 Stiffness of right hip, not elsewhere classified: Secondary | ICD-10-CM

## 2023-03-06 DIAGNOSIS — R2689 Other abnormalities of gait and mobility: Secondary | ICD-10-CM | POA: Diagnosis not present

## 2023-03-06 DIAGNOSIS — M6281 Muscle weakness (generalized): Secondary | ICD-10-CM

## 2023-03-06 DIAGNOSIS — R252 Cramp and spasm: Secondary | ICD-10-CM

## 2023-03-06 DIAGNOSIS — R262 Difficulty in walking, not elsewhere classified: Secondary | ICD-10-CM | POA: Diagnosis not present

## 2023-03-06 DIAGNOSIS — M25551 Pain in right hip: Secondary | ICD-10-CM

## 2023-03-06 NOTE — Therapy (Signed)
OUTPATIENT PHYSICAL THERAPY TREATMENT   Patient Name: Carrie Barnes MRN: [id number] DOB:Jun 29, 1936, 87 y.o., female Today's Date: 03/06/2023  END OF SESSION:  PT End of Session - 03/06/23 1016     Visit Number 17    Date for PT Re-Evaluation 04/03/23    Authorization Type BCBS Medicare    Progress Note Due on Visit 20    PT Start Time 1016    PT Stop Time 1100    PT Time Calculation (min) 44 min    Activity Tolerance Patient tolerated treatment well    Behavior During Therapy [facility] for tasks assessed/performed               Past Medical History:  Diagnosis Date   Cholecystitis    Coronary artery disease    a. 03/2017: 80-90% LAD stenosis (PCI/DES placement with a 2.75x16 mm Promus Premier stent). No significant stenosis along RCA or LCx.    DJD (degenerative joint disease)    GERD (gastroesophageal reflux disease)    Hyperlipidemia    Hypertension    dx s/p MI    Ischemic cardiomyopathy    Myocardial infarction (HCC) 03/17/2017   03-2017 treated at [facility]    Osteoporosis    Status post insertion of drug-eluting stent into left anterior descending (LAD) artery 03/2017   Past Surgical History:  Procedure Laterality Date   APPENDECTOMY     CESAREAN SECTION     CHOLECYSTECTOMY N/A 11/05/2017   Procedure: LAPAROSCOPIC CHOLECYSTECTOMY WITH INTRAOPERATIVE CHOLANGIOGRAM;  Surgeon: Darnell Level, MD;  Location: [facility] ORS;  Service: General;  Laterality: N/A;   ERCP N/A 11/11/2017   Procedure: ENDOSCOPIC RETROGRADE CHOLANGIOPANCREATOGRAPHY (ERCP);  Surgeon: Willis Modena, MD;  Location: [facility];  Service: Endoscopy;  Laterality: N/A;  possible   EUS N/A 11/11/2017   Procedure: UPPER ENDOSCOPIC ULTRASOUND (EUS) RADIAL;  Surgeon: Willis Modena, MD;  Location: [facility] END[facility];  Service: Endoscopy;  Laterality: N/A;   FEMUR IM NAIL Right 07/13/2019   Procedure: INTRAMEDULLARY (IM) RETROGRADE FEMORAL NAILING;  Surgeon: Ollen Gross, MD;  Location: [facility]  ORS;  Service: Orthopedics;  Laterality: Right;    LUMBAR LAMINECTOMY/ DECOMPRESSION WITH MET-RX     OPEN REDUCTION INTERNAL FIXATION ACETABULAR FRACTURE STOPPA Right 05/14/2022   Procedure: OPEN REDUCTION INTERNAL FIXATION RIGHT ACETABULUM;  Surgeon: Roby Lofts, MD;  Location: [facility] OR;  Service: Orthopedics;  Laterality: Right;   ORIF FEMUR FRACTURE Right 08/21/2022   Procedure: RIGHT OPEN REDUCTION INTERNAL FIXATION (ORIF) DISTAL FEMUR FRACTURE;  Surgeon: Roby Lofts, MD;  Location: [facility] OR;  Service: Orthopedics;  Laterality: Right;   REVERSE SHOULDER ARTHROPLASTY Right 05/15/2022   Procedure: RIGHT REVERSE SHOULDER ARTHROPLASTY;  Surgeon: Yolonda Kida, MD;  Location: [facility] OR;  Service: Orthopedics;  Laterality: Right;   SHOULDER ARTHROSCOPY WITH ROTATOR CUFF REPAIR AND SUBACROMIAL DECOMPRESSION Right 09/20/2021   Procedure: SHOULDER MINI OPEN ROTATOR CUFF REPAIR AND SUBACROMIAL DECOMPRESSION WITH POSSIBLE PATCH GRAFT;  Surgeon: Jene Every, MD;  Location: [facility] OR[facility];  Service: Orthopedics;  Laterality: Right;  90 MINS   stent  Left 03/2017   anterior descending ; drug eluting ; tx of MI at [facility]    TONSILLECTOMY     VAGINAL HYSTERECTOMY     vaginal sling     Patient Active Problem List   Diagnosis Date Noted   Closed bicondylar fracture of distal femur (HCC) 08/20/2022   Hyponatremia 08/20/2022   Hypomagnesemia 05/13/2022   Pelvis fracture (HCC) 05/12/2022   Humeral  head fracture, right, closed, initial encounter 05/12/2022   Elevated troponin 05/12/2022   Hematoma 05/12/2022   Hypertension 11/05/2021   Complete rotator cuff tear 09/20/2021   Closed intertrochanteric fracture of right hip (HCC) 07/11/2019   Closed right hip fracture (HCC) 07/11/2019   Chronic cholecystitis due to cholelithiasis with choledocholithiasis 11/05/2017   Cholelithiasis with chronic cholecystitis 11/01/2017   Cholelithiasis 05/18/2017   CAD in native artery 03/31/2017    Ischemic cardiomyopathy 03/31/2017   Hyperlipemia 03/31/2017   DJD (degenerative joint disease) 03/31/2017   Osteoporosis 03/31/2017    PCP: Renford Dills, MD  REFERRING PROVIDER: Dion Saucier D, PA  REFERRING DIAG: ORIF R Distal Femur FX 08/21/22 ORIF R Acetabulum FX 05/14/22  THERAPY DIAG:  Muscle weakness (generalized)  Pain in right hip  Stiffness of right hip, not elsewhere classified  Difficulty in walking, not elsewhere classified  Cramp and spasm  Other abnormalities of gait and mobility  Rationale for Evaluation and Treatment: Rehabilitation  ONSET DATE: R hip ORIF 08/21/22  SUBJECTIVE:   SUBJECTIVE STATEMENT: Pt reports a little soreness today. States her R thigh tightened up again. Pt states she has tried the stretches at home but does not have a strap  PERTINENT HISTORY: From H&P: 87 yo female with history of CAD, ischemic cardiomyopathy, paroxysmal atrial fibrillation, HTN, HLD, PACs and spinal stenosis 3rd R hip fx, R shoulder fx  PAIN:  Are you having pain? Yes: NPRS scale: 6/10 Pain location: R front thigh and knee Pain description: dull Aggravating factors: standing/walking Relieving factors: rest  PRECAUTIONS: None  WEIGHT BEARING RESTRICTIONS: No  FALLS:  Has patient fallen in last 6 months? Yes. Number of falls 2  LIVING ENVIRONMENT: Lives with: lives alone Daughter lives close; family checks in daily Lives in: House/apartment Stairs: No Has following equipment at home: Dan Humphreys - 4 wheeled, shower chair, bed side commode, and Ramped entry  OCCUPATION: Retired  PATIENT GOALS: "I just want to get stronger and not be susceptible to losing balance"  NEXT MD VISIT: n/a  OBJECTIVE: (Measures in this section from initial evaluation unless otherwise noted)  DIAGNOSTIC FINDINGS: 08/21/22 hip x-ray [facility] ORIF  PATIENT SURVEYS:  Eval:  FOTO 40; predicted 53 02/11/2023:  FOTO 59 (goal met)  EDEMA:  Notes a little R posterior thigh  swelling  MUSCLE LENGTH: Hamstrings: Right ~70 deg; Left ~70 deg Thomas test: Right -10 deg; Left 10 deg  POSTURE: flexed trunk   LOWER EXTREMITY ROM:  Active/Passive ROM Right eval Left eval Right 01/09/23  Hip flexion 80/100 125/125 100/125    LOWER EXTREMITY MMT:  MMT Right eval Left eval Right 02/09/23  Hip flexion 3-* 5 3+ within available range   Hip extension 3- 3+ 3  Hip abduction 2+ 3- 3  Hip adduction     Hip internal rotation     Hip external rotation     Knee flexion 4+ 5 5  Knee extension 3+ 5 4  Ankle dorsiflexion     Ankle plantarflexion     Ankle inversion     Ankle eversion      (Blank rows = not tested) * = concordant pain  FUNCTIONAL TESTS:  Eval: 5 times sit to stand: 12.95 sec Berg Balance test: 40/56  01/23/23 5 times sit<>stand: 10 sec Berg Balance test: 47/56  02/11/2023: 5 times sit to stand: 10.94 sec without UE Timed Up and Go (TUG):  12.24 sec with 4WRW 3 minute walk test:  481 ft with RPE of 8/10  GAIT: Distance walked: 100' Assistive device utilized: Environmental consultant - 4 wheeled Level of assistance: Modified independence Comments: Reciprocal gait, flexed trunk   TODAY'S TREATMENT:   DATE: 03/06/2023 Nustep L5 x 5 min Supine hamstring stretch with strap 2x30 sec IASTM with massage roller on R quad Hip ext PROM and hip flexor stretch Sidelying hip abd 2x10 R&L Prone quad stretch with strap 2x 1 min R Prone hip ext 3x10 Prone hip abd 2.5# 3x10 Seated eccentric hip flexion x10 on R, marching with 2.5# on L x10 Sit<>stand 5# KB 2x10 Modified deadlift 5# KB 2x10 Staggered stance rock forward/backward 2x10  DATE: 03/03/2023 Nustep L5 x 5 min Prone quad stretch with strap 3x1 min (right) Prone hip ext 3x10 Prone hamstring curl 2# 3x10 Prone hip ER 2# 2x10 Prone hip abd 2# 3x10 Sit<>stand 5# KB 2x10 Sit<>stand staggered stance with R LE back x5 Left foot tap up onto balance pad x 10 fwd and then x 10 lateral with cga Alternating  cone taps on floor without UE support with min assist (obvious left glut medius weakness on left during this exercise) Lateral band walks (length of low mat table for safety) green loop x 3 laps Side lying clam 2 x 10 with green loop  DATE: 02/27/2023 Nustep L5 x 5 min Prone quad stretch with strap 2x1 min Prone hip ext 3x10 Prone hamstring curl 2# 3x10 Prone hip ER 2# 2x10 Prone hip abd 2# 3x10 Seated LAQ 2 x 10 with 2# Sit<>stand 5# KB 2x10 Sit<>stand staggered stance with R LE back x5 Staggered stance rock forward/backward 2x10 Standing with equal weight shift + diagonal chops 2# med ball 2x10 Alternating step tap 4" step 2x10  DATE: 02/25/2023 Nustep level 3 x 5 min with PT present to discuss status Supine quad sets x 20 (right) Supine hip abduction 2 x 10 right  Supine heel slides 2 x 10 right Supine SLR (right) vc's to "lock and lift" encouraging terminal knee extension to avoid heel lag Hook lying SAQ 2 x 10 with  2 1/2# right Left side lying single leg clam 2 x 10, then hip abduction 2 x 10 Trigger Point Dry-Needling  Treatment instructions: Expect mild to moderate muscle soreness. S/S of pneumothorax if dry needled over a lung field, and to seek immediate medical attention should they occur. Patient verbalized understanding of these instructions and education. Patient Consent Given: Yes Education handout provided: Yes Muscles treated: right medial and  lateral quad  Electrical stimulation performed: No Parameters: N/A Treatment response/outcome: Skilled palpation used to identify taut bands and trigger points.  Once identified, dry needling techniques used to treat these areas.  Twitch response ellicited along with palpable elongation of muscle.  Following treatment, patient reported significant relief of pain and tension in the muscle and demonstrated less antalgic gait.    PATIENT EDUCATION:  Education details: Exam findings, POC, initial HEP Person educated:  Patient Education method: Explanation, Demonstration, and Handouts Education comprehension: verbalized understanding, returned demonstration, and needs further education  HOME EXERCISE PROGRAM: Access Code: [email or web address] URL: [email or web address] Date: 02/19/2023 Prepared by: Mikey Kirschner  Exercises - Figure 4 Bridge (Mirrored)  - 1 x daily - 7 x weekly - 2 sets - 10 reps - Standing Hamstring Curl with Resistance  - 1 x daily - 7 x weekly - 2 sets - 10 reps - Sidelying Bent Knee Hip Flexion  - 1 x daily - 7 x weekly - 2 sets - 10 reps - Standing 'L' Stretch at  Counter  - 1 x daily - 7 x weekly - 2 sets - 30 sec hold - Standing Lumbar Extension with Counter  - 1 x daily - 7 x weekly - 1 sets - 5 reps - 5 sec hold - Seated Piriformis Stretch  - 1 x daily - 7 x weekly - 2 sets - 30 sec hold - Seated Piriformis Stretch  - 1 x daily - 7 x weekly - 2 sets - 30 sec hold - Sidelying Hip Abduction  - 1 x daily - 7 x weekly - 2 sets - 10 reps - Prone Hip Extension - Two Pillows  - 1 x daily - 7 x weekly - 2 sets - 10 reps - Seated Straight Leg Raise with Support  - 1 x daily - 7 x weekly - 2 sets - 10 reps - Quadricep Stretch with Chair and Counter Support  - 1 x daily - 7 x weekly - 1 sets - 3 reps - 30 sec hold  ASSESSMENT:  CLINICAL IMPRESSION: Ms. Bunny continues to make good progressions with her hip ROM (especially flexion). Pt tends to utilize quads/hip flexor compensation when performing hip abduction in sidelying. Continued to work on progressive hip strengthening. Pt notes good gains with her quad/hip flexor stretching -- continued these today.   OBJECTIVE IMPAIRMENTS: Abnormal gait, decreased activity tolerance, decreased balance, decreased endurance, decreased mobility, difficulty walking, decreased ROM, decreased strength, hypomobility, increased fascial restrictions, impaired flexibility, improper body mechanics, postural dysfunction, and pain.   GOALS: Goals  reviewed with patient? Yes  SHORT TERM GOALS: Target date: 01/12/2023  Pt will be ind with initial HEP Baseline: Goal status: MET  2.  PT will assess 5x STS and set goals accordingly Baseline:  Goal status: MET  3.  PT will assess Berg Balance and set goals accordingly Baseline:  Goal status: MET  4.  Pt will be able to improve R hip flexion to at least 100 deg in sitting Baseline:  01/23/23: 100 deg AROM after stretching Goal status: MET   LONG TERM GOALS: Target date: 04/03/2023    Pt will be ind with management and progression of HEP Baseline:  Goal status: IN PROGRESS  2.  Pt will have 5x STS of </=10 sec to demo decreased risk of falls and improved functional LE strength Baseline: 12.96 sec 01/23/23: 10 sec Goal status: IN PROGRESS  3.  Pt will have improved Berg Balance Score by at least 8 points to demo MCID for decreased fall risk Baseline: 40/56 01/23/23: 47/56 Goal status: IN PROGRESS  4.  Pt will be able to perform household amb (at least 400') without a/d  Baseline:  02/09/23: Walking with cane only when someone is at home for supervision; otherwise walking with rollator x10 min twice a day Goal status: IN PROGRESS  5.  Pt will be able to tolerate standing for at least 20 minutes to be able to cook/wash dishes Baseline:  Goal status: MET 02/09/23  6.  Pt will have improved FOTO to >/=53 Baseline: 02/09/23: 46 Goal status: MET on 02/11/2023   PLAN:  PT FREQUENCY: 2x/week  PT DURATION: 8 weeks  PLANNED INTERVENTIONS: Therapeutic exercises, Therapeutic activity, Neuromuscular re-education, Balance training, Gait training, Patient/Family education, Self Care, Joint mobilization, Stair training, Aquatic Therapy, Cryotherapy, Moist heat, Taping, Ionotophoresis 4mg /ml Dexamethasone, Manual therapy, and Re-evaluation  PLAN FOR NEXT SESSION:  Add in left glut med strengthening, Continue to work on hip ROM and strengthening especially with hip ER and hip abduction.  Emphasize flexibility.  Work on balance and weight shifting. DN as indicated for right quad and abductors.     [facility] [address] Newton Hamilton, PT 03/06/23 10:16 AM   [facility] [address] [place], [place] [address] Phone # [phone] Fax [phone]

## 2023-03-09 ENCOUNTER — Ambulatory Visit: Payer: Medicare Other | Admitting: Physical Therapy

## 2023-03-09 DIAGNOSIS — R252 Cramp and spasm: Secondary | ICD-10-CM | POA: Diagnosis not present

## 2023-03-09 DIAGNOSIS — R2689 Other abnormalities of gait and mobility: Secondary | ICD-10-CM

## 2023-03-09 DIAGNOSIS — M25551 Pain in right hip: Secondary | ICD-10-CM | POA: Diagnosis not present

## 2023-03-09 DIAGNOSIS — R262 Difficulty in walking, not elsewhere classified: Secondary | ICD-10-CM | POA: Diagnosis not present

## 2023-03-09 DIAGNOSIS — M25651 Stiffness of right hip, not elsewhere classified: Secondary | ICD-10-CM

## 2023-03-09 DIAGNOSIS — M6281 Muscle weakness (generalized): Secondary | ICD-10-CM

## 2023-03-09 NOTE — Therapy (Signed)
OUTPATIENT PHYSICAL THERAPY TREATMENT   Patient Name: Carrie Barnes MRN: [id number] DOB:[birth year], 87 y.o., female Today's Date: 03/09/2023  END OF SESSION:  PT End of Session - 03/09/23 0934     Visit Number 18    Date for PT Re-Evaluation 04/03/23    Authorization Type BCBS Medicare    Progress Note Due on Visit 20    PT Start Time 0934    PT Stop Time 1015    PT Time Calculation (min) 41 min    Activity Tolerance Patient tolerated treatment well    Behavior During Therapy [facility] for tasks assessed/performed                Past Medical History:  Diagnosis Date   Cholecystitis    Coronary artery disease    a. 03/2017: 80-90% LAD stenosis (PCI/DES placement with a 2.75x16 mm Promus Premier stent). No significant stenosis along RCA or LCx.    DJD (degenerative joint disease)    GERD (gastroesophageal reflux disease)    Hyperlipidemia    Hypertension    dx s/p MI    Ischemic cardiomyopathy    Myocardial infarction (HCC) 03/17/2017   03-2017 treated at [facility]    Osteoporosis    Status post insertion of drug-eluting stent into left anterior descending (LAD) artery 03/2017   Past Surgical History:  Procedure Laterality Date   APPENDECTOMY     CESAREAN SECTION     CHOLECYSTECTOMY N/A 11/05/2017   Procedure: LAPAROSCOPIC CHOLECYSTECTOMY WITH INTRAOPERATIVE CHOLANGIOGRAM;  Surgeon: Darnell Level, MD;  Location: [facility] ORS;  Service: General;  Laterality: N/A;   ERCP N/A 11/11/2017   Procedure: ENDOSCOPIC RETROGRADE CHOLANGIOPANCREATOGRAPHY (ERCP);  Surgeon: Willis Modena, MD;  Location: [facility];  Service: Endoscopy;  Laterality: N/A;  possible   EUS N/A 11/11/2017   Procedure: UPPER ENDOSCOPIC ULTRASOUND (EUS) RADIAL;  Surgeon: Willis Modena, MD;  Location: [facility];  Service: Endoscopy;  Laterality: N/A;   FEMUR IM NAIL Right 07/13/2019   Procedure: INTRAMEDULLARY (IM) RETROGRADE FEMORAL NAILING;  Surgeon: Ollen Gross, MD;  Location: [facility];  Service: Orthopedics;  Laterality: Right;    LUMBAR LAMINECTOMY/ DECOMPRESSION WITH MET-RX     OPEN REDUCTION INTERNAL FIXATION ACETABULAR FRACTURE STOPPA Right 05/14/2022   Procedure: OPEN REDUCTION INTERNAL FIXATION RIGHT ACETABULUM;  Surgeon: Roby Lofts, MD;  Location: [facility] OR;  Service: Orthopedics;  Laterality: Right;   ORIF FEMUR FRACTURE Right 08/21/2022   Procedure: RIGHT OPEN REDUCTION INTERNAL FIXATION (ORIF) DISTAL FEMUR FRACTURE;  Surgeon: Roby Lofts, MD;  Location: [facility] OR;  Service: Orthopedics;  Laterality: Right;   REVERSE SHOULDER ARTHROPLASTY Right 05/15/2022   Procedure: RIGHT REVERSE SHOULDER ARTHROPLASTY;  Surgeon: Yolonda Kida, MD;  Location: [facility];  Service: Orthopedics;  Laterality: Right;   SHOULDER ARTHROSCOPY WITH ROTATOR CUFF REPAIR AND SUBACROMIAL DECOMPRESSION Right 09/20/2021   Procedure: SHOULDER MINI OPEN ROTATOR CUFF REPAIR AND SUBACROMIAL DECOMPRESSION WITH POSSIBLE PATCH GRAFT;  Surgeon: Jene Every, MD;  Location: [facility];  Service: Orthopedics;  Laterality: Right;  90 MINS   stent  Left 03/2017   anterior descending ; drug eluting ; tx of MI at [facility]    TONSILLECTOMY     VAGINAL HYSTERECTOMY     vaginal sling     Patient Active Problem List   Diagnosis Date Noted   Closed bicondylar fracture of distal femur (HCC) 08/20/2022   Hyponatremia 08/20/2022   Hypomagnesemia 05/13/2022   Pelvis fracture (HCC) 05/12/2022  Humeral head fracture, right, closed, initial encounter 05/12/2022   Elevated troponin 05/12/2022   Hematoma 05/12/2022   Hypertension 11/05/2021   Complete rotator cuff tear 09/20/2021   Closed intertrochanteric fracture of right hip (HCC) 07/11/2019   Closed right hip fracture (HCC) 07/11/2019   Chronic cholecystitis due to cholelithiasis with choledocholithiasis 11/05/2017   Cholelithiasis with chronic cholecystitis 11/01/2017   Cholelithiasis 05/18/2017   CAD in native artery 03/31/2017    Ischemic cardiomyopathy 03/31/2017   Hyperlipemia 03/31/2017   DJD (degenerative joint disease) 03/31/2017   Osteoporosis 03/31/2017    PCP: Renford Dills, MD  REFERRING PROVIDER: Dion Saucier D, PA  REFERRING DIAG: ORIF R Distal Femur FX 08/21/22 ORIF R Acetabulum FX 05/14/22  THERAPY DIAG:  Muscle weakness (generalized)  Pain in right hip  Stiffness of right hip, not elsewhere classified  Difficulty in walking, not elsewhere classified  Cramp and spasm  Other abnormalities of gait and mobility  Rationale for Evaluation and Treatment: Rehabilitation  ONSET DATE: R hip ORIF 08/21/22  SUBJECTIVE:   SUBJECTIVE STATEMENT: Pt states her R leg is hurting today.   PERTINENT HISTORY: From H&P: 87 yo female with history of CAD, ischemic cardiomyopathy, paroxysmal atrial fibrillation, HTN, HLD, PACs and spinal stenosis 3rd R hip fx, R shoulder fx  PAIN:  Are you having pain? Yes: NPRS scale: 6/10 Pain location: R front thigh and knee Pain description: dull Aggravating factors: standing/walking Relieving factors: rest  PRECAUTIONS: None  WEIGHT BEARING RESTRICTIONS: No  FALLS:  Has patient fallen in last 6 months? Yes. Number of falls 2  LIVING ENVIRONMENT: Lives with: lives alone Daughter lives close; family checks in daily Lives in: House/apartment Stairs: No Has following equipment at home: Dan Humphreys - 4 wheeled, shower chair, bed side commode, and Ramped entry  OCCUPATION: Retired  PATIENT GOALS: "I just want to get stronger and not be susceptible to losing balance"  NEXT MD VISIT: n/a  OBJECTIVE: (Measures in this section from initial evaluation unless otherwise noted)  DIAGNOSTIC FINDINGS: 08/21/22 hip x-ray demos ORIF  PATIENT SURVEYS:  Eval:  FOTO 40; predicted 53 02/11/2023:  FOTO 59 (goal met)  EDEMA:  Notes a little R posterior thigh swelling  MUSCLE LENGTH: Hamstrings: Right ~70 deg; Left ~70 deg Thomas test: Right -10 deg; Left 10  deg  POSTURE: flexed trunk   LOWER EXTREMITY ROM:  Active/Passive ROM Right eval Left eval Right 01/09/23  Hip flexion 80/100 125/125 100/125    LOWER EXTREMITY MMT:  MMT Right eval Left eval Right 02/09/23  Hip flexion 3-* 5 3+ within available range   Hip extension 3- 3+ 3  Hip abduction 2+ 3- 3  Hip adduction     Hip internal rotation     Hip external rotation     Knee flexion 4+ 5 5  Knee extension 3+ 5 4  Ankle dorsiflexion     Ankle plantarflexion     Ankle inversion     Ankle eversion      (Blank rows = not tested) * = concordant pain  FUNCTIONAL TESTS:  Eval: 5 times sit to stand: 12.95 sec Berg Balance test: 40/56  01/23/23 5 times sit<>stand: 10 sec Berg Balance test: 47/56  02/11/2023: 5 times sit to stand: 10.94 sec without UE Timed Up and Go (TUG):  12.24 sec with 4WRW 3 minute walk test:  481 ft with RPE of 8/10  GAIT: Distance walked: 100' Assistive device utilized: Environmental consultant - 4 wheeled Level of assistance: Modified independence Comments: Reciprocal  gait, flexed trunk   TODAY'S TREATMENT:   DATE: 03/09/2023 Nustep L5 x 5 min IASTM with massage roller R quad, TPR & STM adductor, quad Supine hamstring stretch with strap x30 sec R Supine adductor stretch with strap x30 sec R Supine ITB stretch with strap x30 sec R Prone quad stretch with strap 2x 1 min R Prone hip ext 3x10 R&L Prone hamstring curl 3# 3x10 R Prone hip abd 3# 3x10 R&L Staggered stance rock forward/backward 2x10 6 min ambulation around gym with rollator for endurance  DATE: 03/06/2023 Nustep L5 x 5 min Supine hamstring stretch with strap 2x30 sec IASTM with massage roller on R quad Hip ext PROM and hip flexor stretch Sidelying hip abd 2x10 R&L Prone quad stretch with strap 2x 1 min R Prone hip ext 3x10 Prone hip abd 2.5# 3x10 Seated eccentric hip flexion x10 on R, marching with 2.5# on L x10 Sit<>stand 5# KB 2x10 Modified deadlift 5# KB 2x10 Staggered stance rock  forward/backward 2x10  DATE: 03/03/2023 Nustep L5 x 5 min Prone quad stretch with strap 3x1 min (right) Prone hip ext 3x10 Prone hamstring curl 2# 3x10 Prone hip ER 2# 2x10 Prone hip abd 2# 3x10 Sit<>stand 5# KB 2x10 Sit<>stand staggered stance with R LE back x5 Left foot tap up onto balance pad x 10 fwd and then x 10 lateral with cga Alternating cone taps on floor without UE support with min assist (obvious left glut medius weakness on left during this exercise) Lateral band walks (length of low mat table for safety) green loop x 3 laps Side lying clam 2 x 10 with green loop   PATIENT EDUCATION:  Education details: Exam findings, POC, initial HEP Person educated: Patient Education method: Explanation, Demonstration, and Handouts Education comprehension: verbalized understanding, returned demonstration, and needs further education  HOME EXERCISE PROGRAM: Access Code: [email or web address] URL: [email or web address] Date: 02/19/2023 Prepared by: Mikey Kirschner  Exercises - Figure 4 Bridge (Mirrored)  - 1 x daily - 7 x weekly - 2 sets - 10 reps - Standing Hamstring Curl with Resistance  - 1 x daily - 7 x weekly - 2 sets - 10 reps - Sidelying Bent Knee Hip Flexion  - 1 x daily - 7 x weekly - 2 sets - 10 reps - Standing 'L' Stretch at Counter  - 1 x daily - 7 x weekly - 2 sets - 30 sec hold - Standing Lumbar Extension with Counter  - 1 x daily - 7 x weekly - 1 sets - 5 reps - 5 sec hold - Seated Piriformis Stretch  - 1 x daily - 7 x weekly - 2 sets - 30 sec hold - Seated Piriformis Stretch  - 1 x daily - 7 x weekly - 2 sets - 30 sec hold - Sidelying Hip Abduction  - 1 x daily - 7 x weekly - 2 sets - 10 reps - Prone Hip Extension - Two Pillows  - 1 x daily - 7 x weekly - 2 sets - 10 reps - Seated Straight Leg Raise with Support  - 1 x daily - 7 x weekly - 2 sets - 10 reps - Quadricep Stretch with Chair and Counter Support  - 1 x daily - 7 x weekly - 1 sets - 3 reps - 30 sec  hold  ASSESSMENT:  CLINICAL IMPRESSION: Continued progressive hip strengthening. Has difficulty performing hip abduction without low back compensation -- improved with cues for core activation. Worked on walking endurance with good pt  tolerance - able to better maintain full upright trunk extension throughout 6 minutes.   OBJECTIVE IMPAIRMENTS: Abnormal gait, decreased activity tolerance, decreased balance, decreased endurance, decreased mobility, difficulty walking, decreased ROM, decreased strength, hypomobility, increased fascial restrictions, impaired flexibility, improper body mechanics, postural dysfunction, and pain.   GOALS: Goals reviewed with patient? Yes  SHORT TERM GOALS: Target date: 01/12/2023  Pt will be ind with initial HEP Baseline: Goal status: MET  2.  PT will assess 5x STS and set goals accordingly Baseline:  Goal status: MET  3.  PT will assess Berg Balance and set goals accordingly Baseline:  Goal status: MET  4.  Pt will be able to improve R hip flexion to at least 100 deg in sitting Baseline:  01/23/23: 100 deg AROM after stretching Goal status: MET   LONG TERM GOALS: Target date: 04/03/2023    Pt will be ind with management and progression of HEP Baseline:  Goal status: IN PROGRESS  2.  Pt will have 5x STS of </=10 sec to demo decreased risk of falls and improved functional LE strength Baseline: 12.96 sec 01/23/23: 10 sec Goal status: IN PROGRESS  3.  Pt will have improved Berg Balance Score by at least 8 points to demo MCID for decreased fall risk Baseline: 40/56 01/23/23: 47/56 Goal status: IN PROGRESS  4.  Pt will be able to perform household amb (at least 400') without a/d  Baseline:  02/09/23: Walking with cane only when someone is at home for supervision; otherwise walking with rollator x10 min twice a day Goal status: IN PROGRESS  5.  Pt will be able to tolerate standing for at least 20 minutes to be able to cook/wash dishes Baseline:   Goal status: MET 02/09/23  6.  Pt will have improved FOTO to >/=53 Baseline: 02/09/23: 46 Goal status: MET on 02/11/2023   PLAN:  PT FREQUENCY: 2x/week  PT DURATION: 8 weeks  PLANNED INTERVENTIONS: Therapeutic exercises, Therapeutic activity, Neuromuscular re-education, Balance training, Gait training, Patient/Family education, Self Care, Joint mobilization, Stair training, Aquatic Therapy, Cryotherapy, Moist heat, Taping, Ionotophoresis 4mg /ml Dexamethasone, Manual therapy, and Re-evaluation  PLAN FOR NEXT SESSION:  Continue glut med strengthening, Continue to work on hip ROM and strengthening especially with hip ER and hip abduction.  Emphasize flexibility.  Work on balance and weight shifting. DN as indicated for right quad and abductors.     [facility] April Randalyn Rhea [place], [place] 03/09/23 9:34 AM   [facility] [address] [place], [place] [address] Phone # [phone] Fax [phone]

## 2023-03-13 ENCOUNTER — Ambulatory Visit: Payer: Medicare Other | Attending: Physician Assistant | Admitting: Physical Therapy

## 2023-03-13 DIAGNOSIS — R252 Cramp and spasm: Secondary | ICD-10-CM | POA: Insufficient documentation

## 2023-03-13 DIAGNOSIS — R2689 Other abnormalities of gait and mobility: Secondary | ICD-10-CM | POA: Diagnosis not present

## 2023-03-13 DIAGNOSIS — M25651 Stiffness of right hip, not elsewhere classified: Secondary | ICD-10-CM | POA: Diagnosis not present

## 2023-03-13 DIAGNOSIS — M25551 Pain in right hip: Secondary | ICD-10-CM | POA: Diagnosis not present

## 2023-03-13 DIAGNOSIS — M6281 Muscle weakness (generalized): Secondary | ICD-10-CM | POA: Diagnosis not present

## 2023-03-13 DIAGNOSIS — R262 Difficulty in walking, not elsewhere classified: Secondary | ICD-10-CM | POA: Insufficient documentation

## 2023-03-13 NOTE — Therapy (Signed)
OUTPATIENT PHYSICAL THERAPY TREATMENT   Patient Name: Carrie Barnes MRN: [id number] DOB:1936-04-15, 87 y.o., female Today's Date: 03/13/2023  END OF SESSION:  PT End of Session - 03/13/23 1101     Visit Number 19    Date for PT Re-Evaluation 04/03/23    Authorization Type BCBS Medicare    Progress Note Due on Visit 20    PT Start Time 1101    PT Stop Time 1140    PT Time Calculation (min) 39 min    Activity Tolerance Patient tolerated treatment well    Behavior During Therapy [facility] for tasks assessed/performed                Past Medical History:  Diagnosis Date   Cholecystitis    Coronary artery disease    a. 03/2017: 80-90% LAD stenosis (PCI/DES placement with a 2.75x16 mm Promus Premier stent). No significant stenosis along RCA or LCx.    DJD (degenerative joint disease)    GERD (gastroesophageal reflux disease)    Hyperlipidemia    Hypertension    dx s/p MI    Ischemic cardiomyopathy    Myocardial infarction (HCC) 03/17/2017   03-2017 treated at [facility]    Osteoporosis    Status post insertion of drug-eluting stent into left anterior descending (LAD) artery 03/2017   Past Surgical History:  Procedure Laterality Date   APPENDECTOMY     CESAREAN SECTION     CHOLECYSTECTOMY N/A 11/05/2017   Procedure: LAPAROSCOPIC CHOLECYSTECTOMY WITH INTRAOPERATIVE CHOLANGIOGRAM;  Surgeon: Darnell Level, MD;  Location: [facility];  Service: General;  Laterality: N/A;   ERCP N/A 11/11/2017   Procedure: ENDOSCOPIC RETROGRADE CHOLANGIOPANCREATOGRAPHY (ERCP);  Surgeon: Willis Modena, MD;  Location: [facility];  Service: Endoscopy;  Laterality: N/A;  possible   EUS N/A 11/11/2017   Procedure: UPPER ENDOSCOPIC ULTRASOUND (EUS) RADIAL;  Surgeon: Willis Modena, MD;  Location: [facility];  Service: Endoscopy;  Laterality: N/A;   FEMUR IM NAIL Right 07/13/2019   Procedure: INTRAMEDULLARY (IM) RETROGRADE FEMORAL NAILING;  Surgeon: Ollen Gross, MD;  Location: [facility];  Service: Orthopedics;  Laterality: Right;    LUMBAR LAMINECTOMY/ DECOMPRESSION WITH MET-RX     OPEN REDUCTION INTERNAL FIXATION ACETABULAR FRACTURE STOPPA Right 05/14/2022   Procedure: OPEN REDUCTION INTERNAL FIXATION RIGHT ACETABULUM;  Surgeon: Roby Lofts, MD;  Location: [facility];  Service: Orthopedics;  Laterality: Right;   ORIF FEMUR FRACTURE Right 08/21/2022   Procedure: RIGHT OPEN REDUCTION INTERNAL FIXATION (ORIF) DISTAL FEMUR FRACTURE;  Surgeon: Roby Lofts, MD;  Location: [facility] OR;  Service: Orthopedics;  Laterality: Right;   REVERSE SHOULDER ARTHROPLASTY Right 05/15/2022   Procedure: RIGHT REVERSE SHOULDER ARTHROPLASTY;  Surgeon: Yolonda Kida, MD;  Location: [facility];  Service: Orthopedics;  Laterality: Right;   SHOULDER ARTHROSCOPY WITH ROTATOR CUFF REPAIR AND SUBACROMIAL DECOMPRESSION Right 09/20/2021   Procedure: SHOULDER MINI OPEN ROTATOR CUFF REPAIR AND SUBACROMIAL DECOMPRESSION WITH POSSIBLE PATCH GRAFT;  Surgeon: Jene Every, MD;  Location: [facility];  Service: Orthopedics;  Laterality: Right;  90 MINS   stent  Left 03/2017   anterior descending ; drug eluting ; tx of MI at [facility]    TONSILLECTOMY     VAGINAL HYSTERECTOMY     vaginal sling     Patient Active Problem List   Diagnosis Date Noted   Closed bicondylar fracture of distal femur (HCC) 08/20/2022   Hyponatremia 08/20/2022   Hypomagnesemia 05/13/2022   Pelvis fracture (HCC) 05/12/2022  Humeral head fracture, right, closed, initial encounter 05/12/2022   Elevated troponin 05/12/2022   Hematoma 05/12/2022   Hypertension 11/05/2021   Complete rotator cuff tear 09/20/2021   Closed intertrochanteric fracture of right hip (HCC) 07/11/2019   Closed right hip fracture (HCC) 07/11/2019   Chronic cholecystitis due to cholelithiasis with choledocholithiasis 11/05/2017   Cholelithiasis with chronic cholecystitis 11/01/2017   Cholelithiasis 05/18/2017   CAD in native artery 03/31/2017    Ischemic cardiomyopathy 03/31/2017   Hyperlipemia 03/31/2017   DJD (degenerative joint disease) 03/31/2017   Osteoporosis 03/31/2017    PCP: Renford Dills, MD  REFERRING PROVIDER: Dion Saucier D, PA  REFERRING DIAG: ORIF R Distal Femur FX 08/21/22 ORIF R Acetabulum FX 05/14/22  THERAPY DIAG:  Muscle weakness (generalized)  Pain in right hip  Stiffness of right hip, not elsewhere classified  Difficulty in walking, not elsewhere classified  Cramp and spasm  Other abnormalities of gait and mobility  Rationale for Evaluation and Treatment: Rehabilitation  ONSET DATE: R hip ORIF 08/21/22  SUBJECTIVE:   SUBJECTIVE STATEMENT: Pt states it's been a good week. Feels looser today. Pt reports her R leg feels the worst if she sits for too long (~15 min).   PERTINENT HISTORY: From H&P: 87 yo female with history of CAD, ischemic cardiomyopathy, paroxysmal atrial fibrillation, HTN, HLD, PACs and spinal stenosis 3rd R hip fx, R shoulder fx  PAIN:  Are you having pain? Yes: NPRS scale: 6/10 Pain location: R front thigh and knee Pain description: dull Aggravating factors: standing/walking Relieving factors: rest  PRECAUTIONS: None  WEIGHT BEARING RESTRICTIONS: No  FALLS:  Has patient fallen in last 6 months? Yes. Number of falls 2  LIVING ENVIRONMENT: Lives with: lives alone Daughter lives close; family checks in daily Lives in: House/apartment Stairs: No Has following equipment at home: Dan Humphreys - 4 wheeled, shower chair, bed side commode, and Ramped entry  OCCUPATION: Retired  PATIENT GOALS: "I just want to get stronger and not be susceptible to losing balance"  NEXT MD VISIT: n/a  OBJECTIVE: (Measures in this section from initial evaluation unless otherwise noted)  DIAGNOSTIC FINDINGS: 08/21/22 hip x-ray demos ORIF  PATIENT SURVEYS:  Eval:  FOTO 40; predicted 53 02/11/2023:  FOTO 59 (goal met)  EDEMA:  Notes a little R posterior thigh swelling  MUSCLE  LENGTH: Hamstrings: Right ~70 deg; Left ~70 deg Thomas test: Right -10 deg; Left 10 deg  POSTURE: flexed trunk   LOWER EXTREMITY ROM:  Active/Passive ROM Right eval Left eval Right 01/09/23  Hip flexion 80/100 125/125 100/125    LOWER EXTREMITY MMT:  MMT Right eval Left eval Right 02/09/23  Hip flexion 3-* 5 3+ within available range   Hip extension 3- 3+ 3  Hip abduction 2+ 3- 3  Hip adduction     Hip internal rotation     Hip external rotation     Knee flexion 4+ 5 5  Knee extension 3+ 5 4  Ankle dorsiflexion     Ankle plantarflexion     Ankle inversion     Ankle eversion      (Blank rows = not tested) * = concordant pain  FUNCTIONAL TESTS:  Eval: 5 times sit to stand: 12.95 sec Berg Balance test: 40/56  01/23/23 5 times sit<>stand: 10 sec Berg Balance test: 47/56  02/11/2023: 5 times sit to stand: 10.94 sec without UE Timed Up and Go (TUG):  12.24 sec with 4WRW 3 minute walk test:  481 ft with RPE of 8/10  GAIT: Distance walked: 100' Assistive device utilized: Environmental consultant - 4 wheeled Level of assistance: Modified independence Comments: Reciprocal gait, flexed trunk   TODAY'S TREATMENT:   DATE: 03/13/23 Nustep L5 x 6 min Sitting piriformis stretch x 30 sec Sitting figure 4 stretch x 30 sec Sitting hamstring stretch 2 x 30 sec Sitting adductor stretch x 30 sec Prone quad stretch with strap 2x 1 min R Prone hip ext 3x10 R&L Prone hip abd 2x10 R&L Sit<>stand 5# 2x10 Deadlift modified 5# 2x10 Staggered sit<>stand 5# x10 One foot on 6" step glute set iso 2x10 sec for increased weightbearing Double leg, leg press 50# x10 Single leg leg press 60# 2x8 on R, 2x10 on L  DATE: 03/09/2023 Nustep L5 x 5 min IASTM with massage roller R quad, TPR & STM adductor, quad Supine hamstring stretch with strap x30 sec R Supine adductor stretch with strap x30 sec R Supine ITB stretch with strap x30 sec R Prone quad stretch with strap 2x 1 min R Prone hip ext 3x10  R&L Prone hamstring curl 3# 3x10 R Prone hip abd 3# 3x10 R&L Staggered stance rock forward/backward 2x10 6 min ambulation around gym with rollator for endurance  DATE: 03/06/2023 Nustep L5 x 5 min Supine hamstring stretch with strap 2x30 sec IASTM with massage roller on R quad Hip ext PROM and hip flexor stretch Sidelying hip abd 2x10 R&L Prone quad stretch with strap 2x 1 min R Prone hip ext 3x10 Prone hip abd 2.5# 3x10 Seated eccentric hip flexion x10 on R, marching with 2.5# on L x10 Sit<>stand 5# KB 2x10 Modified deadlift 5# KB 2x10 Staggered stance rock forward/backward 2x10  DATE: 03/03/2023 Nustep L5 x 5 min Prone quad stretch with strap 3x1 min (right) Prone hip ext 3x10 Prone hamstring curl 2# 3x10 Prone hip ER 2# 2x10 Prone hip abd 2# 3x10 Sit<>stand 5# KB 2x10 Sit<>stand staggered stance with R LE back x5 Left foot tap up onto balance pad x 10 fwd and then x 10 lateral with cga Alternating cone taps on floor without UE support with min assist (obvious left glut medius weakness on left during this exercise) Lateral band walks (length of low mat table for safety) green loop x 3 laps Side lying clam 2 x 10 with green loop   PATIENT EDUCATION:  Education details: Exam findings, POC, initial HEP Person educated: Patient Education method: Explanation, Demonstration, and Handouts Education comprehension: verbalized understanding, returned demonstration, and needs further education  HOME EXERCISE PROGRAM: Access Code: [email or web address] URL: [email or web address] Date: 02/19/2023 Prepared by: Mikey Kirschner  Exercises - Figure 4 Bridge (Mirrored)  - 1 x daily - 7 x weekly - 2 sets - 10 reps - Standing Hamstring Curl with Resistance  - 1 x daily - 7 x weekly - 2 sets - 10 reps - Sidelying Bent Knee Hip Flexion  - 1 x daily - 7 x weekly - 2 sets - 10 reps - Standing 'L' Stretch at Counter  - 1 x daily - 7 x weekly - 2 sets - 30 sec hold - Standing Lumbar  Extension with Counter  - 1 x daily - 7 x weekly - 1 sets - 5 reps - 5 sec hold - Seated Piriformis Stretch  - 1 x daily - 7 x weekly - 2 sets - 30 sec hold - Seated Piriformis Stretch  - 1 x daily - 7 x weekly - 2 sets - 30 sec hold - Sidelying Hip Abduction  - 1 x daily -  7 x weekly - 2 sets - 10 reps - Prone Hip Extension - Two Pillows  - 1 x daily - 7 x weekly - 2 sets - 10 reps - Seated Straight Leg Raise with Support  - 1 x daily - 7 x weekly - 2 sets - 10 reps - Quadricep Stretch with Chair and Counter Support  - 1 x daily - 7 x weekly - 1 sets - 3 reps - 30 sec hold  ASSESSMENT:  CLINICAL IMPRESSION: Improving hip abduction with less low back compensation. Anterior hip remains tight. Continued to work on [employer] and functional lifting. Attempted alternating foot taps but pt still gets too much pain with full weight on R LE -- will continue to try and progress with leg press.   OBJECTIVE IMPAIRMENTS: Abnormal gait, decreased activity tolerance, decreased balance, decreased endurance, decreased mobility, difficulty walking, decreased ROM, decreased strength, hypomobility, increased fascial restrictions, impaired flexibility, improper body mechanics, postural dysfunction, and pain.   GOALS: Goals reviewed with patient? Yes  SHORT TERM GOALS: Target date: 01/12/2023  Pt will be ind with initial HEP Baseline: Goal status: MET  2.  PT will assess 5x STS and set goals accordingly Baseline:  Goal status: MET  3.  PT will assess Berg Balance and set goals accordingly Baseline:  Goal status: MET  4.  Pt will be able to improve R hip flexion to at least 100 deg in sitting Baseline:  01/23/23: 100 deg AROM after stretching Goal status: MET   LONG TERM GOALS: Target date: 04/03/2023    Pt will be ind with management and progression of HEP Baseline:  Goal status: IN PROGRESS  2.  Pt will have 5x STS of </=10 sec to demo decreased risk of falls and improved functional  LE strength Baseline: 12.96 sec 01/23/23: 10 sec Goal status: IN PROGRESS  3.  Pt will have improved Berg Balance Score by at least 8 points to demo MCID for decreased fall risk Baseline: 40/56 01/23/23: 47/56 Goal status: IN PROGRESS  4.  Pt will be able to perform household amb (at least 400') without a/d  Baseline:  02/09/23: Walking with cane only when someone is at home for supervision; otherwise walking with rollator x10 min twice a day Goal status: IN PROGRESS  5.  Pt will be able to tolerate standing for at least 20 minutes to be able to cook/wash dishes Baseline:  Goal status: MET 02/09/23  6.  Pt will have improved FOTO to >/=53 Baseline: 02/09/23: 46 Goal status: MET on 02/11/2023   PLAN:  PT FREQUENCY: 2x/week  PT DURATION: 8 weeks  PLANNED INTERVENTIONS: Therapeutic exercises, Therapeutic activity, Neuromuscular re-education, Balance training, Gait training, Patient/Family education, Self Care, Joint mobilization, Stair training, Aquatic Therapy, Cryotherapy, Moist heat, Taping, Ionotophoresis 4mg /ml Dexamethasone, Manual therapy, and Re-evaluation  PLAN FOR NEXT SESSION:  20th visit progress note. Continue glut med strengthening, Continue to work on hip ROM and strengthening especially with hip ER and hip abduction.  Emphasize flexibility.  Work on balance and weight shifting. DN as indicated for right quad and abductors.     [facility] April Randalyn Rhea Pekin, PT 03/13/23 11:50 AM   [facility] [address] [place], [place] [address] Phone # [phone] Fax [phone]

## 2023-03-17 ENCOUNTER — Ambulatory Visit: Payer: Medicare Other

## 2023-03-17 DIAGNOSIS — M25651 Stiffness of right hip, not elsewhere classified: Secondary | ICD-10-CM

## 2023-03-17 DIAGNOSIS — M6281 Muscle weakness (generalized): Secondary | ICD-10-CM

## 2023-03-17 DIAGNOSIS — R252 Cramp and spasm: Secondary | ICD-10-CM | POA: Diagnosis not present

## 2023-03-17 DIAGNOSIS — M25551 Pain in right hip: Secondary | ICD-10-CM

## 2023-03-17 DIAGNOSIS — R262 Difficulty in walking, not elsewhere classified: Secondary | ICD-10-CM

## 2023-03-17 DIAGNOSIS — R2689 Other abnormalities of gait and mobility: Secondary | ICD-10-CM | POA: Diagnosis not present

## 2023-03-17 NOTE — Therapy (Signed)
OUTPATIENT PHYSICAL THERAPY TREATMENT Progress Note Reporting Period 02/09/23 to 03/17/23  See note below for Objective Data and Assessment of Progress/Goals.       Patient Name: Carrie Barnes MRN: [id number] DOB:06-29-1936, 87 y.o., female Today's Date: 03/17/2023  END OF SESSION:  PT End of Session - 03/17/23 1145     Visit Number 20    Date for PT Re-Evaluation 04/03/23    Authorization Type BCBS Medicare    Progress Note Due on Visit 30    PT Start Time 1145    PT Stop Time 1228    PT Time Calculation (min) 43 min    Activity Tolerance Patient tolerated treatment well    Behavior During Therapy [facility] for tasks assessed/performed                Past Medical History:  Diagnosis Date   Cholecystitis    Coronary artery disease    a. 03/2017: 80-90% LAD stenosis (PCI/DES placement with a 2.75x16 mm Promus Premier stent). No significant stenosis along RCA or LCx.    DJD (degenerative joint disease)    GERD (gastroesophageal reflux disease)    Hyperlipidemia    Hypertension    dx s/p MI    Ischemic cardiomyopathy    Myocardial infarction (HCC) 03/17/2017   03-2017 treated at [facility]    Osteoporosis    Status post insertion of drug-eluting stent into left anterior descending (LAD) artery 03/2017   Past Surgical History:  Procedure Laterality Date   APPENDECTOMY     CESAREAN SECTION     CHOLECYSTECTOMY N/A 11/05/2017   Procedure: LAPAROSCOPIC CHOLECYSTECTOMY WITH INTRAOPERATIVE CHOLANGIOGRAM;  Surgeon: Darnell Level, MD;  Location: [facility];  Service: General;  Laterality: N/A;   ERCP N/A 11/11/2017   Procedure: ENDOSCOPIC RETROGRADE CHOLANGIOPANCREATOGRAPHY (ERCP);  Surgeon: Willis Modena, MD;  Location: [facility];  Service: Endoscopy;  Laterality: N/A;  possible   EUS N/A 11/11/2017   Procedure: UPPER ENDOSCOPIC ULTRASOUND (EUS) RADIAL;  Surgeon: Willis Modena, MD;  Location: [facility];  Service: Endoscopy;  Laterality: N/A;   FEMUR IM  NAIL Right 07/13/2019   Procedure: INTRAMEDULLARY (IM) RETROGRADE FEMORAL NAILING;  Surgeon: Ollen Gross, MD;  Location: [facility];  Service: Orthopedics;  Laterality: Right;    LUMBAR LAMINECTOMY/ DECOMPRESSION WITH MET-RX     OPEN REDUCTION INTERNAL FIXATION ACETABULAR FRACTURE STOPPA Right 05/14/2022   Procedure: OPEN REDUCTION INTERNAL FIXATION RIGHT ACETABULUM;  Surgeon: Roby Lofts, MD;  Location: [facility] OR;  Service: Orthopedics;  Laterality: Right;   ORIF FEMUR FRACTURE Right 08/21/2022   Procedure: RIGHT OPEN REDUCTION INTERNAL FIXATION (ORIF) DISTAL FEMUR FRACTURE;  Surgeon: Roby Lofts, MD;  Location: [facility] OR;  Service: Orthopedics;  Laterality: Right;   REVERSE SHOULDER ARTHROPLASTY Right 05/15/2022   Procedure: RIGHT REVERSE SHOULDER ARTHROPLASTY;  Surgeon: Yolonda Kida, MD;  Location: [facility] OR;  Service: Orthopedics;  Laterality: Right;   SHOULDER ARTHROSCOPY WITH ROTATOR CUFF REPAIR AND SUBACROMIAL DECOMPRESSION Right 09/20/2021   Procedure: SHOULDER MINI OPEN ROTATOR CUFF REPAIR AND SUBACROMIAL DECOMPRESSION WITH POSSIBLE PATCH GRAFT;  Surgeon: Jene Every, MD;  Location: [facility];  Service: Orthopedics;  Laterality: Right;  90 MINS   stent  Left 03/2017   anterior descending ; drug eluting ; tx of MI at [facility]    TONSILLECTOMY     VAGINAL HYSTERECTOMY     vaginal sling     Patient Active Problem List   Diagnosis Date Noted   Closed bicondylar  fracture of distal femur (HCC) 08/20/2022   Hyponatremia 08/20/2022   Hypomagnesemia 05/13/2022   Pelvis fracture (HCC) 05/12/2022   Humeral head fracture, right, closed, initial encounter 05/12/2022   Elevated troponin 05/12/2022   Hematoma 05/12/2022   Hypertension 11/05/2021   Complete rotator cuff tear 09/20/2021   Closed intertrochanteric fracture of right hip (HCC) 07/11/2019   Closed right hip fracture (HCC) 07/11/2019   Chronic cholecystitis due to cholelithiasis with choledocholithiasis  11/05/2017   Cholelithiasis with chronic cholecystitis 11/01/2017   Cholelithiasis 05/18/2017   CAD in native artery 03/31/2017   Ischemic cardiomyopathy 03/31/2017   Hyperlipemia 03/31/2017   DJD (degenerative joint disease) 03/31/2017   Osteoporosis 03/31/2017    PCP: Renford Dills, MD  REFERRING PROVIDER: Dion Saucier D, PA  REFERRING DIAG: ORIF R Distal Femur FX 08/21/22 ORIF R Acetabulum FX 05/14/22  THERAPY DIAG:  Muscle weakness (generalized)  Pain in right hip  Stiffness of right hip, not elsewhere classified  Difficulty in walking, not elsewhere classified  Cramp and spasm  Rationale for Evaluation and Treatment: Rehabilitation  ONSET DATE: R hip ORIF 08/21/22  SUBJECTIVE:   SUBJECTIVE STATEMENT: Pt states she hasn't been walking as much.  I do the stretch laying on my stomach 5 times per week.  I think I do better when I walk more.  Reports she is still having pain but sees some improvement with the stretching.   PERTINENT HISTORY: From H&P: 87 yo female with history of CAD, ischemic cardiomyopathy, paroxysmal atrial fibrillation, HTN, HLD, PACs and spinal stenosis 3rd R hip fx, R shoulder fx  PAIN:  Are you having pain? Yes: NPRS scale: 5/10 Pain location: R front thigh and knee Pain description: dull Aggravating factors: standing/walking Relieving factors: rest  PRECAUTIONS: None  WEIGHT BEARING RESTRICTIONS: No  FALLS:  Has patient fallen in last 6 months? Yes. Number of falls 2  LIVING ENVIRONMENT: Lives with: lives alone Daughter lives close; family checks in daily Lives in: House/apartment Stairs: No Has following equipment at home: Dan Humphreys - 4 wheeled, shower chair, bed side commode, and Ramped entry  OCCUPATION: Retired  PATIENT GOALS: "I just want to get stronger and not be susceptible to losing balance"  NEXT MD VISIT: n/a  OBJECTIVE: (Measures in this section from initial evaluation unless otherwise noted)  DIAGNOSTIC FINDINGS:  08/21/22 hip x-ray demos ORIF  PATIENT SURVEYS:  Eval:  FOTO 40; predicted 53 02/11/2023:  FOTO 59 (goal met)  EDEMA:  Notes a little R posterior thigh swelling  MUSCLE LENGTH: Hamstrings: Right ~70 deg; Left ~70 deg Thomas test: Right -10 deg; Left 10 deg  POSTURE: flexed trunk   LOWER EXTREMITY ROM:  Active/Passive ROM Right eval Left eval Right 01/09/23 Right 03/17/23  Hip flexion 80/100 125/125 100/125 110/128    LOWER EXTREMITY MMT:  MMT Right eval Left eval Right 02/09/23 Right 03/17/23  Hip flexion 3-* 5 3+ within available range  3+  Hip extension 3- 3+ 3 3+  Hip abduction 2+ 3- 3 3+  Hip adduction      Hip internal rotation      Hip external rotation      Knee flexion 4+ 5 5 5   Knee extension 3+ 5 4 4   Ankle dorsiflexion      Ankle plantarflexion      Ankle inversion      Ankle eversion       (Blank rows = not tested) * = concordant pain  FUNCTIONAL TESTS:  Eval: 5 times sit to  stand: 12.95 sec Berg Balance test: 40/56  01/23/23 5 times sit<>stand: 10 sec Berg Balance test: 47/56  02/11/2023: 5 times sit to stand: 10.94 sec without UE Timed Up and Go (TUG):  12.24 sec with 4WRW 3 minute walk test:  481 ft with RPE of 8/10  03/17/2023: 5 times sit to stand: 10.85 sec without UE Timed Up and Go (TUG):  10.53 sec with 4WRW 3 minute walk test:  507.2 ft with RPE of 4/10  GAIT: Distance walked: 100' Assistive device utilized: Environmental consultant - 4 wheeled Level of assistance: Modified independence Comments: Reciprocal gait, flexed trunk   TODAY'S TREATMENT:   DATE: 03/17/23 20th visit progress note completed Seated piriformis stretch x 5, hold 20 sec each using strap Supine hamstring stretch x 5 hold 20 sec each using strap Supine IT band stretch x 5 hold 20 sec each using strap Manual rolling to right IT band x 3 min Manual STM to right IT band x 2 min Manual IASTM to right IT band x 5 min  DATE: 03/13/23 Nustep L5 x 6 min Sitting piriformis stretch x 30  sec Sitting figure 4 stretch x 30 sec Sitting hamstring stretch 2 x 30 sec Sitting adductor stretch x 30 sec Prone quad stretch with strap 2x 1 min R Prone hip ext 3x10 R&L Prone hip abd 2x10 R&L Sit<>stand 5# 2x10 Deadlift modified 5# 2x10 Staggered sit<>stand 5# x10 One foot on 6" step glute set iso 2x10 sec for increased weightbearing Double leg, leg press 50# x10 Single leg leg press 60# 2x8 on R, 2x10 on L  DATE: 03/09/2023 Nustep L5 x 5 min IASTM with massage roller R quad, TPR & STM adductor, quad Supine hamstring stretch with strap x30 sec R Supine adductor stretch with strap x30 sec R Supine ITB stretch with strap x30 sec R Prone quad stretch with strap 2x 1 min R Prone hip ext 3x10 R&L Prone hamstring curl 3# 3x10 R Prone hip abd 3# 3x10 R&L Staggered stance rock forward/backward 2x10 6 min ambulation around gym with rollator for endurance  PATIENT EDUCATION:  Education details: Exam findings, POC, initial HEP Person educated: Patient Education method: Explanation, Demonstration, and Handouts Education comprehension: verbalized understanding, returned demonstration, and needs further education  HOME EXERCISE PROGRAM: Access Code: [email or web address] URL: [email or web address] Date: 02/19/2023 Prepared by: Mikey Kirschner  Exercises - Figure 4 Bridge (Mirrored)  - 1 x daily - 7 x weekly - 2 sets - 10 reps - Standing Hamstring Curl with Resistance  - 1 x daily - 7 x weekly - 2 sets - 10 reps - Sidelying Bent Knee Hip Flexion  - 1 x daily - 7 x weekly - 2 sets - 10 reps - Standing 'L' Stretch at Counter  - 1 x daily - 7 x weekly - 2 sets - 30 sec hold - Standing Lumbar Extension with Counter  - 1 x daily - 7 x weekly - 1 sets - 5 reps - 5 sec hold - Seated Piriformis Stretch  - 1 x daily - 7 x weekly - 2 sets - 30 sec hold - Seated Piriformis Stretch  - 1 x daily - 7 x weekly - 2 sets - 30 sec hold - Sidelying Hip Abduction  - 1 x daily - 7 x weekly - 2 sets -  10 reps - Prone Hip Extension - Two Pillows  - 1 x daily - 7 x weekly - 2 sets - 10 reps - Seated Straight Leg Raise with  Support  - 1 x daily - 7 x weekly - 2 sets - 10 reps - Theatre manager with Chair and Counter Support  - 1 x daily - 7 x weekly - 1 sets - 3 reps - 30 sec hold  ASSESSMENT:  CLINICAL IMPRESSION: All objective measurements improved on re-assessment.  She is still having pain but this is improving.  She understands the need to avoid prolonged sitting. IT band continues to be quite restricted.  She responds well to manual techniques and reported no pain on weight bearing after session today.  She would benefit from continued skilled PT for flexibility and soft tissue mobility to right IT band.   OBJECTIVE IMPAIRMENTS: Abnormal gait, decreased activity tolerance, decreased balance, decreased endurance, decreased mobility, difficulty walking, decreased ROM, decreased strength, hypomobility, increased fascial restrictions, impaired flexibility, improper body mechanics, postural dysfunction, and pain.   GOALS: Goals reviewed with patient? Yes  SHORT TERM GOALS: Target date: 01/12/2023  Pt will be ind with initial HEP Baseline: Goal status: MET  2.  PT will assess 5x STS and set goals accordingly Baseline:  Goal status: MET  3.  PT will assess Berg Balance and set goals accordingly Baseline:  Goal status: MET  4.  Pt will be able to improve R hip flexion to at least 100 deg in sitting Baseline:  01/23/23: 100 deg AROM after stretching Goal status: MET   LONG TERM GOALS: Target date: 04/03/2023    Pt will be ind with management and progression of HEP Baseline:  Goal status: IN PROGRESS  2.  Pt will have 5x STS of </=10 sec to demo decreased risk of falls and improved functional LE strength Baseline: 12.96 sec 01/23/23: 10 sec Goal status: IN PROGRESS  3.  Pt will have improved Berg Balance Score by at least 8 points to demo MCID for decreased fall  risk Baseline: 40/56 01/23/23: 47/56 Goal status: IN PROGRESS  4.  Pt will be able to perform household amb (at least 400') without a/d  Baseline:  02/09/23: Walking with cane only when someone is at home for supervision; otherwise walking with rollator x10 min twice a day Goal status: IN PROGRESS  5.  Pt will be able to tolerate standing for at least 20 minutes to be able to cook/wash dishes Baseline:  Goal status: MET 02/09/23  6.  Pt will have improved FOTO to >/=53 Baseline: 02/09/23: 46 Goal status: MET on 02/11/2023   PLAN:  PT FREQUENCY: 2x/week  PT DURATION: 8 weeks  PLANNED INTERVENTIONS: Therapeutic exercises, Therapeutic activity, Neuromuscular re-education, Balance training, Gait training, Patient/Family education, Self Care, Joint mobilization, Stair training, Aquatic Therapy, Cryotherapy, Moist heat, Taping, Ionotophoresis 4mg /ml Dexamethasone, Manual therapy, and Re-evaluation  PLAN FOR NEXT SESSION:  Continue glut med strengthening, Continue to work on hip ROM and strengthening especially with hip ER and hip abduction.  Emphasize flexibility.  Work on balance and weight shifting. DN as indicated for right quad and abductors.     Victorino Dike B. , PT 03/17/23 5:57 PM   [facility] [address] [place], [place] [address] Phone # [phone] Fax [phone]

## 2023-03-18 DIAGNOSIS — Z471 Aftercare following joint replacement surgery: Secondary | ICD-10-CM | POA: Diagnosis not present

## 2023-03-18 DIAGNOSIS — Z96611 Presence of right artificial shoulder joint: Secondary | ICD-10-CM | POA: Diagnosis not present

## 2023-03-20 ENCOUNTER — Ambulatory Visit: Payer: Medicare Other | Admitting: Physical Therapy

## 2023-03-20 DIAGNOSIS — R2689 Other abnormalities of gait and mobility: Secondary | ICD-10-CM

## 2023-03-20 DIAGNOSIS — M6281 Muscle weakness (generalized): Secondary | ICD-10-CM

## 2023-03-20 DIAGNOSIS — R252 Cramp and spasm: Secondary | ICD-10-CM

## 2023-03-20 DIAGNOSIS — R262 Difficulty in walking, not elsewhere classified: Secondary | ICD-10-CM | POA: Diagnosis not present

## 2023-03-20 DIAGNOSIS — M25651 Stiffness of right hip, not elsewhere classified: Secondary | ICD-10-CM

## 2023-03-20 DIAGNOSIS — M25551 Pain in right hip: Secondary | ICD-10-CM | POA: Diagnosis not present

## 2023-03-20 NOTE — Therapy (Signed)
OUTPATIENT PHYSICAL THERAPY TREATMENT   Patient Name: Carrie Barnes MRN: [id number] DOB:09-08-1935, 87 y.o., female Today's Date: 03/20/2023  END OF SESSION:  PT End of Session - 03/20/23 1105     Visit Number 21    Date for PT Re-Evaluation 04/03/23    Authorization Type BCBS Medicare    Progress Note Due on Visit 30    PT Start Time 1105    PT Stop Time 1145    PT Time Calculation (min) 40 min    Activity Tolerance Patient tolerated treatment well    Behavior During [facility] for tasks assessed/performed                 Past Medical History:  Diagnosis Date   Cholecystitis    Coronary artery disease    a. 03/2017: 80-90% LAD stenosis (PCI/DES placement with a 2.75x16 mm Promus Premier stent). No significant stenosis along RCA or LCx.    DJD (degenerative joint disease)    GERD (gastroesophageal reflux disease)    Hyperlipidemia    Hypertension    dx s/p MI    Ischemic cardiomyopathy    Myocardial infarction (HCC) 03/17/2017   03-2017 treated at [facility]    Osteoporosis    Status post insertion of drug-eluting stent into left anterior descending (LAD) artery 03/2017   Past Surgical History:  Procedure Laterality Date   APPENDECTOMY     CESAREAN SECTION     CHOLECYSTECTOMY N/A 11/05/2017   Procedure: LAPAROSCOPIC CHOLECYSTECTOMY WITH INTRAOPERATIVE CHOLANGIOGRAM;  Surgeon: Darnell Level, MD;  Location: [facility];  Service: General;  Laterality: N/A;   ERCP N/A 11/11/2017   Procedure: ENDOSCOPIC RETROGRADE CHOLANGIOPANCREATOGRAPHY (ERCP);  Surgeon: Willis Modena, MD;  Location: [facility];  Service: Endoscopy;  Laterality: N/A;  possible   EUS N/A 11/11/2017   Procedure: UPPER ENDOSCOPIC ULTRASOUND (EUS) RADIAL;  Surgeon: Willis Modena, MD;  Location: [facility];  Service: Endoscopy;  Laterality: N/A;   FEMUR IM NAIL Right 07/13/2019   Procedure: INTRAMEDULLARY (IM) RETROGRADE FEMORAL NAILING;  Surgeon: Ollen Gross, MD;  Location: [facility];  Service: Orthopedics;  Laterality: Right;    LUMBAR LAMINECTOMY/ DECOMPRESSION WITH MET-RX     OPEN REDUCTION INTERNAL FIXATION ACETABULAR FRACTURE STOPPA Right 05/14/2022   Procedure: OPEN REDUCTION INTERNAL FIXATION RIGHT ACETABULUM;  Surgeon: Roby Lofts, MD;  Location: [facility] OR;  Service: Orthopedics;  Laterality: Right;   ORIF FEMUR FRACTURE Right 08/21/2022   Procedure: RIGHT OPEN REDUCTION INTERNAL FIXATION (ORIF) DISTAL FEMUR FRACTURE;  Surgeon: Roby Lofts, MD;  Location: [facility] OR;  Service: Orthopedics;  Laterality: Right;   REVERSE SHOULDER ARTHROPLASTY Right 05/15/2022   Procedure: RIGHT REVERSE SHOULDER ARTHROPLASTY;  Surgeon: Yolonda Kida, MD;  Location: [facility] OR;  Service: Orthopedics;  Laterality: Right;   SHOULDER ARTHROSCOPY WITH ROTATOR CUFF REPAIR AND SUBACROMIAL DECOMPRESSION Right 09/20/2021   Procedure: SHOULDER MINI OPEN ROTATOR CUFF REPAIR AND SUBACROMIAL DECOMPRESSION WITH POSSIBLE PATCH GRAFT;  Surgeon: Jene Every, MD;  Location: [facility];  Service: Orthopedics;  Laterality: Right;  90 MINS   stent  Left 03/2017   anterior descending ; drug eluting ; tx of MI at [facility]    TONSILLECTOMY     VAGINAL HYSTERECTOMY     vaginal sling     Patient Active Problem List   Diagnosis Date Noted   Closed bicondylar fracture of distal femur (HCC) 08/20/2022   Hyponatremia 08/20/2022   Hypomagnesemia 05/13/2022   Pelvis fracture (HCC) 05/12/2022  Humeral head fracture, right, closed, initial encounter 05/12/2022   Elevated troponin 05/12/2022   Hematoma 05/12/2022   Hypertension 11/05/2021   Complete rotator cuff tear 09/20/2021   Closed intertrochanteric fracture of right hip (HCC) 07/11/2019   Closed right hip fracture (HCC) 07/11/2019   Chronic cholecystitis due to cholelithiasis with choledocholithiasis 11/05/2017   Cholelithiasis with chronic cholecystitis 11/01/2017   Cholelithiasis 05/18/2017   CAD in native artery 03/31/2017    Ischemic cardiomyopathy 03/31/2017   Hyperlipemia 03/31/2017   DJD (degenerative joint disease) 03/31/2017   Osteoporosis 03/31/2017    PCP: Renford Dills, MD  REFERRING PROVIDER: Dion Saucier D, PA  REFERRING DIAG: ORIF R Distal Femur FX 08/21/22 ORIF R Acetabulum FX 05/14/22  THERAPY DIAG:  Muscle weakness (generalized)  Pain in right hip  Stiffness of right hip, not elsewhere classified  Difficulty in walking, not elsewhere classified  Cramp and spasm  Other abnormalities of gait and mobility  Rationale for Evaluation and Treatment: Rehabilitation  ONSET DATE: R hip ORIF 08/21/22  SUBJECTIVE:   SUBJECTIVE STATEMENT: Pt reports she saw doctor for her shoulder. Did not recommend more therapy or surgery. Discussed with her stretching the right arm. Has stretched her leg this morning.   PERTINENT HISTORY: From H&P: 87 yo female with history of CAD, ischemic cardiomyopathy, paroxysmal atrial fibrillation, HTN, HLD, PACs and spinal stenosis 3rd R hip fx, R shoulder fx  PAIN:  Are you having pain? Yes: NPRS scale: 5/10 Pain location: R front thigh and knee Pain description: dull Aggravating factors: standing/walking Relieving factors: rest  PRECAUTIONS: None  WEIGHT BEARING RESTRICTIONS: No  FALLS:  Has patient fallen in last 6 months? Yes. Number of falls 2  LIVING ENVIRONMENT: Lives with: lives alone Daughter lives close; family checks in daily Lives in: House/apartment Stairs: No Has following equipment at home: Dan Humphreys - 4 wheeled, shower chair, bed side commode, and Ramped entry  OCCUPATION: Retired  PATIENT GOALS: "I just want to get stronger and not be susceptible to losing balance"  NEXT MD VISIT: n/a  OBJECTIVE: (Measures in this section from initial evaluation unless otherwise noted)  DIAGNOSTIC FINDINGS: 08/21/22 hip x-ray [facility] ORIF  PATIENT SURVEYS:  Eval:  FOTO 40; predicted 53 02/11/2023:  FOTO 59 (goal met)  EDEMA:  Notes a little R  posterior thigh swelling  MUSCLE LENGTH: Hamstrings: Right ~70 deg; Left ~70 deg Thomas test: Right -10 deg; Left 10 deg  POSTURE: flexed trunk   LOWER EXTREMITY ROM:  Active/Passive ROM Right eval Left eval Right 01/09/23 Right 03/17/23  Hip flexion 80/100 125/125 100/125 110/128    LOWER EXTREMITY MMT:  MMT Right eval Left eval Right 02/09/23 Right 03/17/23  Hip flexion 3-* 5 3+ within available range  3+  Hip extension 3- 3+ 3 3+  Hip abduction 2+ 3- 3 3+  Hip adduction      Hip internal rotation      Hip external rotation      Knee flexion 4+ 5 5 5   Knee extension 3+ 5 4 4   Ankle dorsiflexion      Ankle plantarflexion      Ankle inversion      Ankle eversion       (Blank rows = not tested) * = concordant pain  FUNCTIONAL TESTS:  Eval: 5 times sit to stand: 12.95 sec Berg Balance test: 40/56  01/23/23 5 times sit<>stand: 10 sec Berg Balance test: 47/56  02/11/2023: 5 times sit to stand: 10.94 sec without UE Timed Up and  Go (TUG):  12.24 sec with 4WRW 3 minute walk test:  481 ft with RPE of 8/10  03/17/2023: 5 times sit to stand: 10.85 sec without UE Timed Up and Go (TUG):  10.53 sec with 4WRW 3 minute walk test:  507.2 ft with RPE of 4/10  GAIT: Distance walked: 100' Assistive device utilized: Environmental consultant - 4 wheeled Level of assistance: Modified independence Comments: Reciprocal gait, flexed trunk   TODAY'S TREATMENT:   DATE: 03/20/23 Recumbent bike L1 x 5 min Seated hip flexor stretch 2x 30 sec Seated hip extension with leg off EOB 2x10 Hip flexor/ITB stretch with contract/relax Manual IASTM to right IT band x 5 min Standing staggered stance weight shift forward/backward 2x10 Standing heel tap forward 2x10 Amb x 6 min with SPC  Double leg, leg press 70# 2x10 Single leg leg press eccentrics 60# 2x8 on R  DATE: 03/17/23 20th visit progress note completed Seated piriformis stretch x 5, hold 20 sec each using strap Supine hamstring stretch x 5 hold 20 sec  each using strap Supine IT band stretch x 5 hold 20 sec each using strap Manual rolling to right IT band x 3 min Manual STM to right IT band x 2 min Manual IASTM to right IT band x 5 min  DATE: 03/13/23 Nustep L5 x 6 min Sitting piriformis stretch x 30 sec Sitting figure 4 stretch x 30 sec Sitting hamstring stretch 2 x 30 sec Sitting adductor stretch x 30 sec Prone quad stretch with strap 2x 1 min R Prone hip ext 3x10 R&L Prone hip abd 2x10 R&L Sit<>stand 5# 2x10 Deadlift modified 5# 2x10 Staggered sit<>stand 5# x10 One foot on 6" step glute set iso 2x10 sec for increased weightbearing Double leg, leg press 50# x10 Single leg leg press 60# 2x8 on R, 2x10 on L  DATE: 03/09/2023 Nustep L5 x 5 min IASTM with massage roller R quad, TPR & STM adductor, quad Supine hamstring stretch with strap x30 sec R Supine adductor stretch with strap x30 sec R Supine ITB stretch with strap x30 sec R Prone quad stretch with strap 2x 1 min R Prone hip ext 3x10 R&L Prone hamstring curl 3# 3x10 R Prone hip abd 3# 3x10 R&L Staggered stance rock forward/backward 2x10 6 min ambulation around gym with rollator for endurance  PATIENT EDUCATION:  Education details: Exam findings, POC, initial HEP Person educated: Patient Education method: Explanation, Demonstration, and Handouts Education comprehension: verbalized understanding, returned demonstration, and needs further education  HOME EXERCISE PROGRAM: Access Code: [email or web address] URL: [email or web address] Date: 02/19/2023 Prepared by: Mikey Kirschner  Exercises - Figure 4 Bridge (Mirrored)  - 1 x daily - 7 x weekly - 2 sets - 10 reps - Standing Hamstring Curl with Resistance  - 1 x daily - 7 x weekly - 2 sets - 10 reps - Sidelying Bent Knee Hip Flexion  - 1 x daily - 7 x weekly - 2 sets - 10 reps - Standing 'L' Stretch at Counter  - 1 x daily - 7 x weekly - 2 sets - 30 sec hold - Standing Lumbar Extension with Counter  - 1 x daily - 7 x  weekly - 1 sets - 5 reps - 5 sec hold - Seated Piriformis Stretch  - 1 x daily - 7 x weekly - 2 sets - 30 sec hold - Seated Piriformis Stretch  - 1 x daily - 7 x weekly - 2 sets - 30 sec hold - Sidelying Hip Abduction  - 1  x daily - 7 x weekly - 2 sets - 10 reps - Prone Hip Extension - Two Pillows  - 1 x daily - 7 x weekly - 2 sets - 10 reps - Seated Straight Leg Raise with Support  - 1 x daily - 7 x weekly - 2 sets - 10 reps - Quadricep Stretch with Chair and Counter Support  - 1 x daily - 7 x weekly - 1 sets - 3 reps - 30 sec hold  ASSESSMENT:  CLINICAL IMPRESSION: Continued to work on hip strengthening and mobility. Pt able to tolerate using the bike this session. Worked on ambulating with cane. Discussed with pt slowly using the cane more at home as her strength continues to increase.   OBJECTIVE IMPAIRMENTS: Abnormal gait, decreased activity tolerance, decreased balance, decreased endurance, decreased mobility, difficulty walking, decreased ROM, decreased strength, hypomobility, increased fascial restrictions, impaired flexibility, improper body mechanics, postural dysfunction, and pain.   GOALS: Goals reviewed with patient? Yes  SHORT TERM GOALS: Target date: 01/12/2023  Pt will be ind with initial HEP Baseline: Goal status: MET  2.  PT will assess 5x STS and set goals accordingly Baseline:  Goal status: MET  3.  PT will assess Berg Balance and set goals accordingly Baseline:  Goal status: MET  4.  Pt will be able to improve R hip flexion to at least 100 deg in sitting Baseline:  01/23/23: 100 deg AROM after stretching Goal status: MET   LONG TERM GOALS: Target date: 04/03/2023   Pt will be ind with management and progression of HEP Baseline:  Goal status: IN PROGRESS  2.  Pt will have 5x STS of </=10 sec to demo decreased risk of falls and improved functional LE strength Baseline: 12.96 sec 01/23/23: 10 sec Goal status: IN PROGRESS  3.  Pt will have improved Berg  Balance Score by at least 8 points to demo MCID for decreased fall risk Baseline: 40/56 01/23/23: 47/56 Goal status: IN PROGRESS  4.  Pt will be able to perform household amb (at least 400') without a/d  Baseline:  02/09/23: Walking with cane only when someone is at home for supervision; otherwise walking with rollator x10 min twice a day Goal status: IN PROGRESS  5.  Pt will be able to tolerate standing for at least 20 minutes to be able to cook/wash dishes Baseline:  Goal status: MET 02/09/23  6.  Pt will have improved FOTO to >/=53 Baseline: 02/09/23: 46 Goal status: MET on 02/11/2023   PLAN:  PT FREQUENCY: 2x/week  PT DURATION: 8 weeks  PLANNED INTERVENTIONS: Therapeutic exercises, Therapeutic activity, Neuromuscular re-education, Balance training, Gait training, Patient/Family education, Self Care, Joint mobilization, Stair training, Aquatic Therapy, Cryotherapy, Moist heat, Taping, Ionotophoresis 4mg /ml Dexamethasone, Manual therapy, and Re-evaluation  PLAN FOR NEXT SESSION:  Continue glut med strengthening, Continue to work on hip ROM and strengthening especially with hip ER and hip abduction.  Emphasize flexibility.  Work on balance and weight shifting. DN as indicated for right quad and abductors.     [facility] April Dell Ponto, PT 03/20/23 11:05 AM   [facility] [address] [place], [place] [address] Phone # [phone] Fax [phone]

## 2023-03-24 ENCOUNTER — Ambulatory Visit: Payer: Medicare Other

## 2023-03-24 DIAGNOSIS — R262 Difficulty in walking, not elsewhere classified: Secondary | ICD-10-CM | POA: Diagnosis not present

## 2023-03-24 DIAGNOSIS — M25651 Stiffness of right hip, not elsewhere classified: Secondary | ICD-10-CM

## 2023-03-24 DIAGNOSIS — M6281 Muscle weakness (generalized): Secondary | ICD-10-CM | POA: Diagnosis not present

## 2023-03-24 DIAGNOSIS — M25551 Pain in right hip: Secondary | ICD-10-CM

## 2023-03-24 DIAGNOSIS — R252 Cramp and spasm: Secondary | ICD-10-CM

## 2023-03-24 DIAGNOSIS — R2689 Other abnormalities of gait and mobility: Secondary | ICD-10-CM | POA: Diagnosis not present

## 2023-03-24 NOTE — Therapy (Signed)
OUTPATIENT PHYSICAL THERAPY TREATMENT   Patient Name: Carrie Barnes MRN: [id number] DOB:1935-11-09, 87 y.o., female Today's Date: 03/24/2023  END OF SESSION:  PT End of Session - 03/24/23 1107     Visit Number 22    Date for PT Re-Evaluation 04/03/23    Authorization Type BCBS Medicare    Progress Note Due on Visit 30    PT Start Time 1100    PT Stop Time 1145    PT Time Calculation (min) 45 min    Activity Tolerance Patient tolerated treatment well    Behavior During Therapy [facility] for tasks assessed/performed                 Past Medical History:  Diagnosis Date   Cholecystitis    Coronary artery disease    a. 03/2017: 80-90% LAD stenosis (PCI/DES placement with a 2.75x16 mm Promus Premier stent). No significant stenosis along RCA or LCx.    DJD (degenerative joint disease)    GERD (gastroesophageal reflux disease)    Hyperlipidemia    Hypertension    dx s/p MI    Ischemic cardiomyopathy    Myocardial infarction (HCC) 03/17/2017   03-2017 treated at [facility]    Osteoporosis    Status post insertion of drug-eluting stent into left anterior descending (LAD) artery 03/2017   Past Surgical History:  Procedure Laterality Date   APPENDECTOMY     CESAREAN SECTION     CHOLECYSTECTOMY N/A 11/05/2017   Procedure: LAPAROSCOPIC CHOLECYSTECTOMY WITH INTRAOPERATIVE CHOLANGIOGRAM;  Surgeon: Darnell Level, MD;  Location: [facility] ORS;  Service: General;  Laterality: N/A;   ERCP N/A 11/11/2017   Procedure: ENDOSCOPIC RETROGRADE CHOLANGIOPANCREATOGRAPHY (ERCP);  Surgeon: Willis Modena, MD;  Location: [facility];  Service: Endoscopy;  Laterality: N/A;  possible   EUS N/A 11/11/2017   Procedure: UPPER ENDOSCOPIC ULTRASOUND (EUS) RADIAL;  Surgeon: Willis Modena, MD;  Location: [facility];  Service: Endoscopy;  Laterality: N/A;   FEMUR IM NAIL Right 07/13/2019   Procedure: INTRAMEDULLARY (IM) RETROGRADE FEMORAL NAILING;  Surgeon: Ollen Gross, MD;  Location: [facility]  ORS;  Service: Orthopedics;  Laterality: Right;    LUMBAR LAMINECTOMY/ DECOMPRESSION WITH MET-RX     OPEN REDUCTION INTERNAL FIXATION ACETABULAR FRACTURE STOPPA Right 05/14/2022   Procedure: OPEN REDUCTION INTERNAL FIXATION RIGHT ACETABULUM;  Surgeon: Roby Lofts, MD;  Location: [facility] OR;  Service: Orthopedics;  Laterality: Right;   ORIF FEMUR FRACTURE Right 08/21/2022   Procedure: RIGHT OPEN REDUCTION INTERNAL FIXATION (ORIF) DISTAL FEMUR FRACTURE;  Surgeon: Roby Lofts, MD;  Location: [facility] OR;  Service: Orthopedics;  Laterality: Right;   REVERSE SHOULDER ARTHROPLASTY Right 05/15/2022   Procedure: RIGHT REVERSE SHOULDER ARTHROPLASTY;  Surgeon: Yolonda Kida, MD;  Location: [facility] OR;  Service: Orthopedics;  Laterality: Right;   SHOULDER ARTHROSCOPY WITH ROTATOR CUFF REPAIR AND SUBACROMIAL DECOMPRESSION Right 09/20/2021   Procedure: SHOULDER MINI OPEN ROTATOR CUFF REPAIR AND SUBACROMIAL DECOMPRESSION WITH POSSIBLE PATCH GRAFT;  Surgeon: Jene Every, MD;  Location: [facility];  Service: Orthopedics;  Laterality: Right;  90 MINS   stent  Left 03/2017   anterior descending ; drug eluting ; tx of MI at [facility]    TONSILLECTOMY     VAGINAL HYSTERECTOMY     vaginal sling     Patient Active Problem List   Diagnosis Date Noted   Closed bicondylar fracture of distal femur (HCC) 08/20/2022   Hyponatremia 08/20/2022   Hypomagnesemia 05/13/2022   Pelvis fracture (HCC) 05/12/2022  Humeral head fracture, right, closed, initial encounter 05/12/2022   Elevated troponin 05/12/2022   Hematoma 05/12/2022   Hypertension 11/05/2021   Complete rotator cuff tear 09/20/2021   Closed intertrochanteric fracture of right hip (HCC) 07/11/2019   Closed right hip fracture (HCC) 07/11/2019   Chronic cholecystitis due to cholelithiasis with choledocholithiasis 11/05/2017   Cholelithiasis with chronic cholecystitis 11/01/2017   Cholelithiasis 05/18/2017   CAD in native artery 03/31/2017    Ischemic cardiomyopathy 03/31/2017   Hyperlipemia 03/31/2017   DJD (degenerative joint disease) 03/31/2017   Osteoporosis 03/31/2017    PCP: Renford Dills, MD  REFERRING PROVIDER: Dion Saucier D, PA  REFERRING DIAG: ORIF R Distal Femur FX 08/21/22 ORIF R Acetabulum FX 05/14/22  THERAPY DIAG:  Muscle weakness (generalized)  Pain in right hip  Stiffness of right hip, not elsewhere classified  Difficulty in walking, not elsewhere classified  Cramp and spasm  Rationale for Evaluation and Treatment: Rehabilitation  ONSET DATE: R hip ORIF 08/21/22  SUBJECTIVE:   SUBJECTIVE STATEMENT: Pt reports she still has some tightness and discomfort but feels like she is getting better the more she does her therapy and comes for therapy sessions.    PERTINENT HISTORY: From H&P: 87 yo female with history of CAD, ischemic cardiomyopathy, paroxysmal atrial fibrillation, HTN, HLD, PACs and spinal stenosis 3rd R hip fx, R shoulder fx  PAIN:  Are you having pain? Yes: NPRS scale: 5/10 Pain location: R front thigh and knee Pain description: dull Aggravating factors: standing/walking Relieving factors: rest  PRECAUTIONS: None  WEIGHT BEARING RESTRICTIONS: No  FALLS:  Has patient fallen in last 6 months? Yes. Number of falls 2  LIVING ENVIRONMENT: Lives with: lives alone Daughter lives close; family checks in daily Lives in: House/apartment Stairs: No Has following equipment at home: Dan Humphreys - 4 wheeled, shower chair, bed side commode, and Ramped entry  OCCUPATION: Retired  PATIENT GOALS: "I just want to get stronger and not be susceptible to losing balance"  NEXT MD VISIT: n/a  OBJECTIVE: (Measures in this section from initial evaluation unless otherwise noted)  DIAGNOSTIC FINDINGS: 08/21/22 hip x-ray demos ORIF  PATIENT SURVEYS:  Eval:  FOTO 40; predicted 53 02/11/2023:  FOTO 59 (goal met)  EDEMA:  Notes a little R posterior thigh swelling  MUSCLE LENGTH: Hamstrings:  Right ~70 deg; Left ~70 deg Thomas test: Right -10 deg; Left 10 deg  POSTURE: flexed trunk   LOWER EXTREMITY ROM:  Active/Passive ROM Right eval Left eval Right 01/09/23 Right 03/17/23  Hip flexion 80/100 125/125 100/125 110/128    LOWER EXTREMITY MMT:  MMT Right eval Left eval Right 02/09/23 Right 03/17/23  Hip flexion 3-* 5 3+ within available range  3+  Hip extension 3- 3+ 3 3+  Hip abduction 2+ 3- 3 3+  Hip adduction      Hip internal rotation      Hip external rotation      Knee flexion 4+ 5 5 5   Knee extension 3+ 5 4 4   Ankle dorsiflexion      Ankle plantarflexion      Ankle inversion      Ankle eversion       (Blank rows = not tested) * = concordant pain  FUNCTIONAL TESTS:  Eval: 5 times sit to stand: 12.95 sec Berg Balance test: 40/56  01/23/23 5 times sit<>stand: 10 sec Berg Balance test: 47/56  02/11/2023: 5 times sit to stand: 10.94 sec without UE Timed Up and Go (TUG):  12.24 sec with 1OXW  3 minute walk test:  481 ft with RPE of 8/10  03/17/2023: 5 times sit to stand: 10.85 sec without UE Timed Up and Go (TUG):  10.53 sec with 4WRW 3 minute walk test:  507.2 ft with RPE of 4/10  GAIT: Distance walked: 100' Assistive device utilized: Environmental consultant - 4 wheeled Level of assistance: Modified independence Comments: Reciprocal gait, flexed trunk   TODAY'S TREATMENT:   DATE: 03/24/23 Recumbent bike L1 x 5 min Seated figure 4 stretch with strap x 5 holding 10 sec (right) Supine IT band stretch with strap x 5 holding 10 sec (right) Prone quad/hip flexor stretch x 10 holding 10 sec each (right) Manual IASTM to right IT band x 6 min Manual STM to right IT band x 8 min Trigger Point Dry-Needling  Treatment instructions: Expect mild to moderate muscle soreness. S/S of pneumothorax if dry needled over a lung field, and to seek immediate medical attention should they occur. Patient verbalized understanding of these instructions and education. Patient Consent Given:  Yes Education handout provided: Yes Muscles treated: right lateral quad Electrical stimulation performed: No Parameters: N/A Treatment response/outcome: Skilled palpation used to identify taut bands and trigger points.  Once identified, dry needling techniques used to treat these areas.  Twitch response ellicited along with palpable elongation of muscle.  Following treatment, patient was able to stand and bear weight without pain.  Gait was improved to heel to toe progression.    DATE: 03/20/23 Recumbent bike L1 x 5 min Seated hip flexor stretch 2x 30 sec Seated hip extension with leg off EOB 2x10 Hip flexor/ITB stretch with contract/relax Manual IASTM to right IT band x 5 min Standing staggered stance weight shift forward/backward 2x10 Standing heel tap forward 2x10 Amb x 6 min with SPC  Double leg, leg press 70# 2x10 Single leg leg press eccentrics 60# 2x8 on R  DATE: 03/17/23 20th visit progress note completed Seated piriformis stretch x 5, hold 20 sec each using strap Supine hamstring stretch x 5 hold 20 sec each using strap Supine IT band stretch x 5 hold 20 sec each using strap Manual rolling to right IT band x 3 min Manual STM to right IT band x 2 min Manual IASTM to right IT band x 5 min  ance  PATIENT EDUCATION:  Education details: Exam findings, POC, initial HEP Person educated: Patient Education method: Explanation, Demonstration, and Handouts Education comprehension: verbalized understanding, returned demonstration, and needs further education  HOME EXERCISE PROGRAM: Access Code: [email or web address] URL: [email or web address] Date: 02/19/2023 Prepared by: Mikey Kirschner  Exercises - Figure 4 Bridge (Mirrored)  - 1 x daily - 7 x weekly - 2 sets - 10 reps - Standing Hamstring Curl with Resistance  - 1 x daily - 7 x weekly - 2 sets - 10 reps - Sidelying Bent Knee Hip Flexion  - 1 x daily - 7 x weekly - 2 sets - 10 reps - Standing 'L' Stretch at Counter  - 1 x  daily - 7 x weekly - 2 sets - 30 sec hold - Standing Lumbar Extension with Counter  - 1 x daily - 7 x weekly - 1 sets - 5 reps - 5 sec hold - Seated Piriformis Stretch  - 1 x daily - 7 x weekly - 2 sets - 30 sec hold - Seated Piriformis Stretch  - 1 x daily - 7 x weekly - 2 sets - 30 sec hold - Sidelying Hip Abduction  - 1 x daily - 7 x  weekly - 2 sets - 10 reps - Prone Hip Extension - Two Pillows  - 1 x daily - 7 x weekly - 2 sets - 10 reps - Seated Straight Leg Raise with Support  - 1 x daily - 7 x weekly - 2 sets - 10 reps - Quadricep Stretch with Chair and Counter Support  - 1 x daily - 7 x weekly - 1 sets - 3 reps - 30 sec hold  ASSESSMENT:  CLINICAL IMPRESSION: Skylen is beginning to make progress with IT band restriction.  She continues to display antalgic gait with flexed knee and hip posture on start up but is able to ease into heel to toe progression within the first few steps.  She is compliant and well motivated with her HEP and should continue to improve.  She would benefit from continued skilled PT to address the restriction in the IT band and flexibility issues.     OBJECTIVE IMPAIRMENTS: Abnormal gait, decreased activity tolerance, decreased balance, decreased endurance, decreased mobility, difficulty walking, decreased ROM, decreased strength, hypomobility, increased fascial restrictions, impaired flexibility, improper body mechanics, postural dysfunction, and pain.   GOALS: Goals reviewed with patient? Yes  SHORT TERM GOALS: Target date: 01/12/2023  Pt will be ind with initial HEP Baseline: Goal status: MET  2.  PT will assess 5x STS and set goals accordingly Baseline:  Goal status: MET  3.  PT will assess Berg Balance and set goals accordingly Baseline:  Goal status: MET  4.  Pt will be able to improve R hip flexion to at least 100 deg in sitting Baseline:  01/23/23: 100 deg AROM after stretching Goal status: MET   LONG TERM GOALS: Target date:  04/03/2023   Pt will be ind with management and progression of HEP Baseline:  Goal status: IN PROGRESS  2.  Pt will have 5x STS of </=10 sec to demo decreased risk of falls and improved functional LE strength Baseline: 12.96 sec 01/23/23: 10 sec Goal status: IN PROGRESS  3.  Pt will have improved Berg Balance Score by at least 8 points to demo MCID for decreased fall risk Baseline: 40/56 01/23/23: 47/56 Goal status: IN PROGRESS  4.  Pt will be able to perform household amb (at least 400') without a/d  Baseline:  02/09/23: Walking with cane only when someone is at home for supervision; otherwise walking with rollator x10 min twice a day Goal status: IN PROGRESS  5.  Pt will be able to tolerate standing for at least 20 minutes to be able to cook/wash dishes Baseline:  Goal status: MET 02/09/23  6.  Pt will have improved FOTO to >/=53 Baseline: 02/09/23: 46 Goal status: MET on 02/11/2023   PLAN:  PT FREQUENCY: 2x/week  PT DURATION: 8 weeks  PLANNED INTERVENTIONS: Therapeutic exercises, Therapeutic activity, Neuromuscular re-education, Balance training, Gait training, Patient/Family education, Self Care, Joint mobilization, Stair training, Aquatic Therapy, Cryotherapy, Moist heat, Taping, Ionotophoresis 4mg /ml Dexamethasone, Manual therapy, and Re-evaluation  PLAN FOR NEXT SESSION:  Continue glut med strengthening, Continue to work on hip ROM and strengthening especially with hip ER and hip abduction.  Emphasize flexibility.  Work on balance and weight shifting. DN as indicated for right quad and abductors.     Victorino Dike B. , PT 03/24/23 1:14 PM  [facility] [address] [place], [place] [address] Phone # [phone] Fax [phone]

## 2023-03-27 ENCOUNTER — Ambulatory Visit: Payer: Medicare Other | Admitting: Physical Therapy

## 2023-03-27 DIAGNOSIS — M25551 Pain in right hip: Secondary | ICD-10-CM

## 2023-03-27 DIAGNOSIS — R262 Difficulty in walking, not elsewhere classified: Secondary | ICD-10-CM

## 2023-03-27 DIAGNOSIS — R2689 Other abnormalities of gait and mobility: Secondary | ICD-10-CM | POA: Diagnosis not present

## 2023-03-27 DIAGNOSIS — M25651 Stiffness of right hip, not elsewhere classified: Secondary | ICD-10-CM

## 2023-03-27 DIAGNOSIS — M6281 Muscle weakness (generalized): Secondary | ICD-10-CM

## 2023-03-27 DIAGNOSIS — R252 Cramp and spasm: Secondary | ICD-10-CM

## 2023-03-27 NOTE — Therapy (Signed)
OUTPATIENT PHYSICAL THERAPY TREATMENT   Patient Name: Carrie Barnes MRN: [id number] DOB:[birth year], 87 y.o., female Today's Date: 03/27/2023  END OF SESSION:  PT End of Session - 03/27/23 1056     Visit Number 23    Date for PT Re-Evaluation 04/03/23    Authorization Type BCBS Medicare    Progress Note Due on Visit 30    PT Start Time 1100    PT Stop Time 1145    PT Time Calculation (min) 45 min    Activity Tolerance Patient tolerated treatment well    Behavior During Therapy [facility] for tasks assessed/performed                 Past Medical History:  Diagnosis Date   Cholecystitis    Coronary artery disease    a. 03/2017: 80-90% LAD stenosis (PCI/DES placement with a 2.75x16 mm Promus Premier stent). No significant stenosis along RCA or LCx.    DJD (degenerative joint disease)    GERD (gastroesophageal reflux disease)    Hyperlipidemia    Hypertension    dx s/p MI    Ischemic cardiomyopathy    Myocardial infarction (HCC) 03/17/2017   03-2017 treated at [facility]    Osteoporosis    Status post insertion of drug-eluting stent into left anterior descending (LAD) artery 03/2017   Past Surgical History:  Procedure Laterality Date   APPENDECTOMY     CESAREAN SECTION     CHOLECYSTECTOMY N/A 11/05/2017   Procedure: LAPAROSCOPIC CHOLECYSTECTOMY WITH INTRAOPERATIVE CHOLANGIOGRAM;  Surgeon: Darnell Level, MD;  Location: [facility];  Service: General;  Laterality: N/A;   ERCP N/A 11/11/2017   Procedure: ENDOSCOPIC RETROGRADE CHOLANGIOPANCREATOGRAPHY (ERCP);  Surgeon: Willis Modena, MD;  Location: [facility];  Service: Endoscopy;  Laterality: N/A;  possible   EUS N/A 11/11/2017   Procedure: UPPER ENDOSCOPIC ULTRASOUND (EUS) RADIAL;  Surgeon: Willis Modena, MD;  Location: [facility];  Service: Endoscopy;  Laterality: N/A;   FEMUR IM NAIL Right 07/13/2019   Procedure: INTRAMEDULLARY (IM) RETROGRADE FEMORAL NAILING;  Surgeon: Ollen Gross, MD;  Location: [facility];  Service: Orthopedics;  Laterality: Right;    LUMBAR LAMINECTOMY/ DECOMPRESSION WITH MET-RX     OPEN REDUCTION INTERNAL FIXATION ACETABULAR FRACTURE STOPPA Right 05/14/2022   Procedure: OPEN REDUCTION INTERNAL FIXATION RIGHT ACETABULUM;  Surgeon: Roby Lofts, MD;  Location: [facility] OR;  Service: Orthopedics;  Laterality: Right;   ORIF FEMUR FRACTURE Right 08/21/2022   Procedure: RIGHT OPEN REDUCTION INTERNAL FIXATION (ORIF) DISTAL FEMUR FRACTURE;  Surgeon: Roby Lofts, MD;  Location: [facility] OR;  Service: Orthopedics;  Laterality: Right;   REVERSE SHOULDER ARTHROPLASTY Right 05/15/2022   Procedure: RIGHT REVERSE SHOULDER ARTHROPLASTY;  Surgeon: Yolonda Kida, MD;  Location: [facility] OR;  Service: Orthopedics;  Laterality: Right;   SHOULDER ARTHROSCOPY WITH ROTATOR CUFF REPAIR AND SUBACROMIAL DECOMPRESSION Right 09/20/2021   Procedure: SHOULDER MINI OPEN ROTATOR CUFF REPAIR AND SUBACROMIAL DECOMPRESSION WITH POSSIBLE PATCH GRAFT;  Surgeon: Jene Every, MD;  Location: [facility];  Service: Orthopedics;  Laterality: Right;  90 MINS   stent  Left 03/2017   anterior descending ; drug eluting ; tx of MI at [facility]    TONSILLECTOMY     VAGINAL HYSTERECTOMY     vaginal sling     Patient Active Problem List   Diagnosis Date Noted   Closed bicondylar fracture of distal femur (HCC) 08/20/2022   Hyponatremia 08/20/2022   Hypomagnesemia 05/13/2022   Pelvis fracture (HCC) 05/12/2022  Humeral head fracture, right, closed, initial encounter 05/12/2022   Elevated troponin 05/12/2022   Hematoma 05/12/2022   Hypertension 11/05/2021   Complete rotator cuff tear 09/20/2021   Closed intertrochanteric fracture of right hip (HCC) 07/11/2019   Closed right hip fracture (HCC) 07/11/2019   Chronic cholecystitis due to cholelithiasis with choledocholithiasis 11/05/2017   Cholelithiasis with chronic cholecystitis 11/01/2017   Cholelithiasis 05/18/2017   CAD in native artery 03/31/2017    Ischemic cardiomyopathy 03/31/2017   Hyperlipemia 03/31/2017   DJD (degenerative joint disease) 03/31/2017   Osteoporosis 03/31/2017    PCP: Renford Dills, MD  REFERRING PROVIDER: Dion Saucier D, PA  REFERRING DIAG: ORIF R Distal Femur FX 08/21/22 ORIF R Acetabulum FX 05/14/22  THERAPY DIAG:  Muscle weakness (generalized)  Pain in right hip  Stiffness of right hip, not elsewhere classified  Difficulty in walking, not elsewhere classified  Cramp and spasm  Other abnormalities of gait and mobility  Rationale for Evaluation and Treatment: Rehabilitation  ONSET DATE: R hip ORIF 08/21/22  SUBJECTIVE:   SUBJECTIVE STATEMENT: Pt states TPDN lasted longer this time. Nothing else new or different.   PERTINENT HISTORY: From H&P: 87 yo female with history of CAD, ischemic cardiomyopathy, paroxysmal atrial fibrillation, HTN, HLD, PACs and spinal stenosis 3rd R hip fx, R shoulder fx  PAIN:  Are you having pain? Yes: NPRS scale: 5/10 Pain location: R front thigh and knee Pain description: dull Aggravating factors: standing/walking Relieving factors: rest  PRECAUTIONS: None  WEIGHT BEARING RESTRICTIONS: No  FALLS:  Has patient fallen in last 6 months? Yes. Number of falls 2  LIVING ENVIRONMENT: Lives with: lives alone Daughter lives close; family checks in daily Lives in: House/apartment Stairs: No Has following equipment at home: Dan Humphreys - 4 wheeled, shower chair, bed side commode, and Ramped entry  OCCUPATION: Retired  PATIENT GOALS: "I just want to get stronger and not be susceptible to losing balance"  NEXT MD VISIT: n/a  OBJECTIVE: (Measures in this section from initial evaluation unless otherwise noted)  DIAGNOSTIC FINDINGS: 08/21/22 hip x-ray demos ORIF  PATIENT SURVEYS:  Eval:  FOTO 40; predicted 53 02/11/2023:  FOTO 59 (goal met)  EDEMA:  Notes a little R posterior thigh swelling  MUSCLE LENGTH: Hamstrings: Right ~70 deg; Left ~70 deg Thomas test:  Right -10 deg; Left 10 deg  POSTURE: flexed trunk   LOWER EXTREMITY ROM:  Active/Passive ROM Right eval Left eval Right 01/09/23 Right 03/17/23  Hip flexion 80/100 125/125 100/125 110/128    LOWER EXTREMITY MMT:  MMT Right eval Left eval Right 02/09/23 Right 03/17/23  Hip flexion 3-* 5 3+ within available range  3+  Hip extension 3- 3+ 3 3+  Hip abduction 2+ 3- 3 3+  Hip adduction      Hip internal rotation      Hip external rotation      Knee flexion 4+ 5 5 5   Knee extension 3+ 5 4 4   Ankle dorsiflexion      Ankle plantarflexion      Ankle inversion      Ankle eversion       (Blank rows = not tested) * = concordant pain  FUNCTIONAL TESTS:  Eval: 5 times sit to stand: 12.95 sec Berg Balance test: 40/56  01/23/23 5 times sit<>stand: 10 sec Berg Balance test: 47/56  02/11/2023: 5 times sit to stand: 10.94 sec without UE Timed Up and Go (TUG):  12.24 sec with 4WRW 3 minute walk test:  481 ft with RPE  of 8/10  03/17/2023: 5 times sit to stand: 10.85 sec without UE Timed Up and Go (TUG):  10.53 sec with 4WRW 3 minute walk test:  507.2 ft with RPE of 4/10  GAIT: Distance walked: 100' Assistive device utilized: Environmental consultant - 4 wheeled Level of assistance: Modified independence Comments: Reciprocal gait, flexed trunk   TODAY'S TREATMENT:   DATE: 03/27/23 Recumbent bike L1 x 5 min Seated hip flexor stretch 2x 30 sec Seated hip extension with leg off EOB 2x10 Hip flexor/ITB stretch with contract/relax Manual IASTM to right IT band Leg press double legs 85# 2x10 Leg press single leg 60# x10, 65# x10 (R) Amb around ortho and cancer gym with [facility] and reciprocal  working on hip extension and heel/toe Backwards walking with SPC 2x25' Side stepping with SPC 2x25' R&L  DATE: 03/24/23 Recumbent bike L1 x 5 min Seated figure 4 stretch with strap x 5 holding 10 sec (right) Supine IT band stretch with strap x 5 holding 10 sec (right) Prone quad/hip flexor stretch x 10 holding 10  sec each (right) Manual IASTM to right IT band x 6 min Manual STM to right IT band x 8 min Trigger Point Dry-Needling  Treatment instructions: Expect mild to moderate muscle soreness. S/S of pneumothorax if dry needled over a lung field, and to seek immediate medical attention should they occur. Patient verbalized understanding of these instructions and education. Patient Consent Given: Yes Education handout provided: Yes Muscles treated: right lateral quad Electrical stimulation performed: No Parameters: N/A Treatment response/outcome: Skilled palpation used to identify taut bands and trigger points.  Once identified, dry needling techniques used to treat these areas.  Twitch response ellicited along with palpable elongation of muscle.  Following treatment, patient was able to stand and bear weight without pain.  Gait was improved to heel to toe progression.    DATE: 03/20/23 Recumbent bike L1 x 5 min. Seated hip flexor stretch 2x 30 sec Seated hip extension with leg off EOB 2x10 Hip flexor/ITB stretch with contract/relax Manual IASTM to right IT band x 5 min Standing staggered stance weight shift forward/backward 2x10 Standing heel tap forward 2x10 Amb x 6 min with SPC  Double leg, leg press 70# 2x10 Single leg leg press eccentrics 60# 2x8 on R  DATE: 03/17/23 20th visit progress note completed Seated piriformis stretch x 5, hold 20 sec each using strap Supine hamstring stretch x 5 hold 20 sec each using strap Supine IT band stretch x 5 hold 20 sec each using strap Manual rolling to right IT band x 3 min Manual STM to right IT band x 2 min Manual IASTM to right IT band x 5 min   PATIENT EDUCATION:  Education details: Exam findings, POC, initial HEP Person educated: Patient Education method: Explanation, Demonstration, and Handouts Education comprehension: verbalized understanding, returned demonstration, and needs further education  HOME EXERCISE PROGRAM: Access Code:  [email or web address] URL: [email or web address] Date: 02/19/2023 Prepared by: Mikey Kirschner  Exercises - Figure 4 Bridge (Mirrored)  - 1 x daily - 7 x weekly - 2 sets - 10 reps - Standing Hamstring Curl with Resistance  - 1 x daily - 7 x weekly - 2 sets - 10 reps - Sidelying Bent Knee Hip Flexion  - 1 x daily - 7 x weekly - 2 sets - 10 reps - Standing 'L' Stretch at Counter  - 1 x daily - 7 x weekly - 2 sets - 30 sec hold - Standing Lumbar Extension with Counter  - 1  x daily - 7 x weekly - 1 sets - 5 reps - 5 sec hold - Seated Piriformis Stretch  - 1 x daily - 7 x weekly - 2 sets - 30 sec hold - Seated Piriformis Stretch  - 1 x daily - 7 x weekly - 2 sets - 30 sec hold - Sidelying Hip Abduction  - 1 x daily - 7 x weekly - 2 sets - 10 reps - Prone Hip Extension - Two Pillows  - 1 x daily - 7 x weekly - 2 sets - 10 reps - Seated Straight Leg Raise with Support  - 1 x daily - 7 x weekly - 2 sets - 10 reps - Quadricep Stretch with Chair and Counter Support  - 1 x daily - 7 x weekly - 1 sets - 3 reps - 30 sec hold  ASSESSMENT:  CLINICAL IMPRESSION: Session focused on on improving gait mechanics, walking in different directions with SPC and dynamic gait/stability. Continued stretching/manual to address hip mobility and ITB tightness.   OBJECTIVE IMPAIRMENTS: Abnormal gait, decreased activity tolerance, decreased balance, decreased endurance, decreased mobility, difficulty walking, decreased ROM, decreased strength, hypomobility, increased fascial restrictions, impaired flexibility, improper body mechanics, postural dysfunction, and pain.   GOALS: Goals reviewed with patient? Yes  SHORT TERM GOALS: Target date: 01/12/2023  Pt will be ind with initial HEP Baseline: Goal status: MET  2.  PT will assess 5x STS and set goals accordingly Baseline:  Goal status: MET  3.  PT will assess Berg Balance and set goals accordingly Baseline:  Goal status: MET  4.  Pt will be able to improve R  hip flexion to at least 100 deg in sitting Baseline:  01/23/23: 100 deg AROM after stretching Goal status: MET   LONG TERM GOALS: Target date: 04/03/2023   Pt will be ind with management and progression of HEP Baseline:  Goal status: IN PROGRESS  2.  Pt will have 5x STS of </=10 sec to demo decreased risk of falls and improved functional LE strength Baseline: 12.96 sec 01/23/23: 10 sec Goal status: IN PROGRESS  3.  Pt will have improved Berg Balance Score by at least 8 points to demo MCID for decreased fall risk Baseline: 40/56 01/23/23: 47/56 Goal status: IN PROGRESS  4.  Pt will be able to perform household amb (at least 400') without a/d  Baseline:  02/09/23: Walking with cane only when someone is at home for supervision; otherwise walking with rollator x10 min twice a day Goal status: IN PROGRESS  5.  Pt will be able to tolerate standing for at least 20 minutes to be able to cook/wash dishes Baseline:  Goal status: MET 02/09/23  6.  Pt will have improved FOTO to >/=53 Baseline: 02/09/23: 46 Goal status: MET on 02/11/2023   PLAN:  PT FREQUENCY: 2x/week  PT DURATION: 8 weeks  PLANNED INTERVENTIONS: Therapeutic exercises, Therapeutic activity, Neuromuscular re-education, Balance training, Gait training, Patient/Family education, Self Care, Joint mobilization, Stair training, Aquatic Therapy, Cryotherapy, Moist heat, Taping, Ionotophoresis 4mg /ml Dexamethasone, Manual therapy, and Re-evaluation  PLAN FOR NEXT SESSION:  Continue glut med strengthening, Continue to work on hip ROM and strengthening especially with hip ER and hip abduction.  Emphasize flexibility.  Work on balance and weight shifting. DN as indicated for right quad and abductors.     [facility] April Randalyn Rhea Randall, [place] 03/27/23 10:57 AM  [facility] [address] [place], [place] [address] Phone # [phone] Fax [phone]

## 2023-03-31 ENCOUNTER — Ambulatory Visit: Payer: Medicare Other

## 2023-03-31 DIAGNOSIS — M25651 Stiffness of right hip, not elsewhere classified: Secondary | ICD-10-CM | POA: Diagnosis not present

## 2023-03-31 DIAGNOSIS — R262 Difficulty in walking, not elsewhere classified: Secondary | ICD-10-CM | POA: Diagnosis not present

## 2023-03-31 DIAGNOSIS — M6281 Muscle weakness (generalized): Secondary | ICD-10-CM | POA: Diagnosis not present

## 2023-03-31 DIAGNOSIS — R2689 Other abnormalities of gait and mobility: Secondary | ICD-10-CM | POA: Diagnosis not present

## 2023-03-31 DIAGNOSIS — M25551 Pain in right hip: Secondary | ICD-10-CM

## 2023-03-31 DIAGNOSIS — R252 Cramp and spasm: Secondary | ICD-10-CM | POA: Diagnosis not present

## 2023-03-31 NOTE — Therapy (Signed)
OUTPATIENT PHYSICAL THERAPY TREATMENT   Patient Name: Carrie Barnes MRN: [id number] DOB:1935-11-04, 87 y.o., female Today's Date: 03/31/2023  END OF SESSION:  PT End of Session - 03/31/23 1105     Visit Number 24    Date for PT Re-Evaluation 04/03/23    Authorization Type BCBS Medicare    Progress Note Due on Visit 30    PT Start Time 1100    PT Stop Time 1145    PT Time Calculation (min) 45 min    Activity Tolerance Patient tolerated treatment well    Behavior During Therapy [facility] for tasks assessed/performed                 Past Medical History:  Diagnosis Date   Cholecystitis    Coronary artery disease    a. 03/2017: 80-90% LAD stenosis (PCI/DES placement with a 2.75x16 mm Promus Premier stent). No significant stenosis along RCA or LCx.    DJD (degenerative joint disease)    GERD (gastroesophageal reflux disease)    Hyperlipidemia    Hypertension    dx s/p MI    Ischemic cardiomyopathy    Myocardial infarction (HCC) 03/17/2017   03-2017 treated at [facility]    Osteoporosis    Status post insertion of drug-eluting stent into left anterior descending (LAD) artery 03/2017   Past Surgical History:  Procedure Laterality Date   APPENDECTOMY     CESAREAN SECTION     CHOLECYSTECTOMY N/A 11/05/2017   Procedure: LAPAROSCOPIC CHOLECYSTECTOMY WITH INTRAOPERATIVE CHOLANGIOGRAM;  Surgeon: Darnell Level, MD;  Location: [facility] ORS;  Service: General;  Laterality: N/A;   ERCP N/A 11/11/2017   Procedure: ENDOSCOPIC RETROGRADE CHOLANGIOPANCREATOGRAPHY (ERCP);  Surgeon: Willis Modena, MD;  Location: [facility];  Service: Endoscopy;  Laterality: N/A;  possible   EUS N/A 11/11/2017   Procedure: UPPER ENDOSCOPIC ULTRASOUND (EUS) RADIAL;  Surgeon: Willis Modena, MD;  Location: [facility] ENDOS[facility];  Service: Endoscopy;  Laterality: N/A;   FEMUR IM NAIL Right 07/13/2019   Procedure: INTRAMEDULLARY (IM) RETROGRADE FEMORAL NAILING;  Surgeon: Ollen Gross, MD;  Location: [facility]  ORS;  Service: Orthopedics;  Laterality: Right;    LUMBAR LAMINECTOMY/ DECOMPRESSION WITH MET-RX     OPEN REDUCTION INTERNAL FIXATION ACETABULAR FRACTURE STOPPA Right 05/14/2022   Procedure: OPEN REDUCTION INTERNAL FIXATION RIGHT ACETABULUM;  Surgeon: Roby Lofts, MD;  Location: [facility] OR;  Service: Orthopedics;  Laterality: Right;   ORIF FEMUR FRACTURE Right 08/21/2022   Procedure: RIGHT OPEN REDUCTION INTERNAL FIXATION (ORIF) DISTAL FEMUR FRACTURE;  Surgeon: Roby Lofts, MD;  Location: [facility] OR;  Service: Orthopedics;  Laterality: Right;   REVERSE SHOULDER ARTHROPLASTY Right 05/15/2022   Procedure: RIGHT REVERSE SHOULDER ARTHROPLASTY;  Surgeon: Yolonda Kida, MD;  Location: [facility] OR;  Service: Orthopedics;  Laterality: Right;   SHOULDER ARTHROSCOPY WITH ROTATOR CUFF REPAIR AND SUBACROMIAL DECOMPRESSION Right 09/20/2021   Procedure: SHOULDER MINI OPEN ROTATOR CUFF REPAIR AND SUBACROMIAL DECOMPRESSION WITH POSSIBLE PATCH GRAFT;  Surgeon: Jene Every, MD;  Location: [facility];  Service: Orthopedics;  Laterality: Right;  90 MINS   stent  Left 03/2017   anterior descending ; drug eluting ; tx of MI at [facility]    TONSILLECTOMY     VAGINAL HYSTERECTOMY     vaginal sling     Patient Active Problem List   Diagnosis Date Noted   Closed bicondylar fracture of distal femur (HCC) 08/20/2022   Hyponatremia 08/20/2022   Hypomagnesemia 05/13/2022   Pelvis fracture (HCC) 05/12/2022  Humeral head fracture, right, closed, initial encounter 05/12/2022   Elevated troponin 05/12/2022   Hematoma 05/12/2022   Hypertension 11/05/2021   Complete rotator cuff tear 09/20/2021   Closed intertrochanteric fracture of right hip (HCC) 07/11/2019   Closed right hip fracture (HCC) 07/11/2019   Chronic cholecystitis due to cholelithiasis with choledocholithiasis 11/05/2017   Cholelithiasis with chronic cholecystitis 11/01/2017   Cholelithiasis 05/18/2017   CAD in native artery 03/31/2017    Ischemic cardiomyopathy 03/31/2017   Hyperlipemia 03/31/2017   DJD (degenerative joint disease) 03/31/2017   Osteoporosis 03/31/2017    PCP: Renford Dills, MD  REFERRING PROVIDER: Dion Saucier D, PA  REFERRING DIAG: ORIF R Distal Femur FX 08/21/22 ORIF R Acetabulum FX 05/14/22  THERAPY DIAG:  Muscle weakness (generalized)  Pain in right hip  Stiffness of right hip, not elsewhere classified  Difficulty in walking, not elsewhere classified  Cramp and spasm  Rationale for Evaluation and Treatment: Rehabilitation  ONSET DATE: R hip ORIF 08/21/22  SUBJECTIVE:   SUBJECTIVE STATEMENT: Patient states she is doing better each day.  States she feels she is finally making progress.  "I am using the cane at home like April suggested but I still tend to go back to the walker because I don't feel safe yet"  "It feels like my right leg will give way"  PERTINENT HISTORY: From H&P: 87 yo female with history of CAD, ischemic cardiomyopathy, paroxysmal atrial fibrillation, HTN, HLD, PACs and spinal stenosis 3rd R hip fx, R shoulder fx  PAIN:  03/31/23 Are you having pain? Yes: NPRS scale: 4/10 Pain location: R front thigh and knee Pain description: dull Aggravating factors: standing/walking Relieving factors: rest  PRECAUTIONS: None  WEIGHT BEARING RESTRICTIONS: No  FALLS:  Has patient fallen in last 6 months? Yes. Number of falls 2  LIVING ENVIRONMENT: Lives with: lives alone Daughter lives close; family checks in daily Lives in: House/apartment Stairs: No Has following equipment at home: Dan Humphreys - 4 wheeled, shower chair, bed side commode, and Ramped entry  OCCUPATION: Retired  PATIENT GOALS: "I just want to get stronger and not be susceptible to losing balance"  NEXT MD VISIT: n/a  OBJECTIVE: (Measures in this section from initial evaluation unless otherwise noted)  DIAGNOSTIC FINDINGS: 08/21/22 hip x-ray demos ORIF  PATIENT SURVEYS:  Eval:  FOTO 40; predicted  53 02/11/2023:  FOTO 59 (goal met)  EDEMA:  Notes a little R posterior thigh swelling  MUSCLE LENGTH: Hamstrings: Right ~70 deg; Left ~70 deg Thomas test: Right -10 deg; Left 10 deg  POSTURE: flexed trunk   LOWER EXTREMITY ROM:  Active/Passive ROM Right eval Left eval Right 01/09/23 Right 03/17/23  Hip flexion 80/100 125/125 100/125 110/128    LOWER EXTREMITY MMT:  MMT Right eval Left eval Right 02/09/23 Right 03/17/23  Hip flexion 3-* 5 3+ within available range  3+  Hip extension 3- 3+ 3 3+  Hip abduction 2+ 3- 3 3+  Hip adduction      Hip internal rotation      Hip external rotation      Knee flexion 4+ 5 5 5   Knee extension 3+ 5 4 4   Ankle dorsiflexion      Ankle plantarflexion      Ankle inversion      Ankle eversion       (Blank rows = not tested) * = concordant pain  FUNCTIONAL TESTS:  Eval: 5 times sit to stand: 12.95 sec Berg Balance test: 40/56  01/23/23 5 times sit<>stand: 10  sec Berg Balance test: 47/56  02/11/2023: 5 times sit to stand: 10.94 sec without UE Timed Up and Go (TUG):  12.24 sec with 4WRW 3 minute walk test:  481 ft with RPE of 8/10  03/17/2023: 5 times sit to stand: 10.85 sec without UE Timed Up and Go (TUG):  10.53 sec with 4WRW 3 minute walk test:  507.2 ft with RPE of 4/10  GAIT: Distance walked: 100' Assistive device utilized: Environmental consultant - 4 wheeled Level of assistance: Modified independence Comments: Reciprocal gait, flexed trunk   TODAY'S TREATMENT:   DATE: 03/31/23 Nustep L1 x 5 min (legs only) At barre:  standing heel raises with weight shift to right At barre: SLS with bilateral UE support x 10 holding 10 sec each Seated LAQ 0# x 20 Seated march x 20 0# Alternating LE cone taps x 20 (initially with UE support, then had patient move away from barre and offered +1 min assist) x 20 total Lateral band walks with green loop around ankles x 5 laps Seated clam with green loop x 20 Standing hip ER with green loop x 20 Sit to  stand with green loop 2 x 5 with emphasis on hip abduction (verbal cues to keep space between her thighs) Leg press double legs 50# 2x10 (patient could not do 85# and really struggled with 50#) Leg press single leg Left 50# 2 x 10, Right 50# 2 x10  Prone manual quad, hip flexor stretch 3 x 30 sec right  DATE: 03/27/23 Recumbent bike L1 x 5 min Seated hip flexor stretch 2x 30 sec Seated hip extension with leg off EOB 2x10 Hip flexor/ITB stretch with contract/relax Manual IASTM to right IT band Leg press double legs 85# 2x10 Leg press single leg 60# x10, 65# x10 (R) Amb around ortho and cancer gym with [facility] and reciprocal  working on hip extension and heel/toe Backwards walking with SPC 2x25' Side stepping with SPC 2x25' R&L  DATE: 03/24/23 Recumbent bike L1 x 5 min Seated figure 4 stretch with strap x 5 holding 10 sec (right) Supine IT band stretch with strap x 5 holding 10 sec (right) Prone quad/hip flexor stretch x 10 holding 10 sec each (right) Manual IASTM to right IT band x 6 min Manual STM to right IT band x 8 min Trigger Point Dry-Needling  Treatment instructions: Expect mild to moderate muscle soreness. S/S of pneumothorax if dry needled over a lung field, and to seek immediate medical attention should they occur. Patient verbalized understanding of these instructions and education. Patient Consent Given: Yes Education handout provided: Yes Muscles treated: right lateral quad Electrical stimulation performed: No Parameters: N/A Treatment response/outcome: Skilled palpation used to identify taut bands and trigger points.  Once identified, dry needling techniques used to treat these areas.  Twitch response ellicited along with palpable elongation of muscle.  Following treatment, patient was able to stand and bear weight without pain.  Gait was improved to heel to toe progression.     PATIENT EDUCATION:  Education details: Exam findings, POC, initial HEP Person educated:  Patient Education method: Explanation, Demonstration, and Handouts Education comprehension: verbalized understanding, returned demonstration, and needs further education  HOME EXERCISE PROGRAM: Access Code: [email or web address] URL: [email or web address] Date: 02/19/2023 Prepared by: Mikey Kirschner  Exercises - Figure 4 Bridge (Mirrored)  - 1 x daily - 7 x weekly - 2 sets - 10 reps - Standing Hamstring Curl with Resistance  - 1 x daily - 7 x weekly - 2 sets - 10 reps -  Sidelying Bent Knee Hip Flexion  - 1 x daily - 7 x weekly - 2 sets - 10 reps - Standing 'L' Stretch at Counter  - 1 x daily - 7 x weekly - 2 sets - 30 sec hold - Standing Lumbar Extension with Counter  - 1 x daily - 7 x weekly - 1 sets - 5 reps - 5 sec hold - Seated Piriformis Stretch  - 1 x daily - 7 x weekly - 2 sets - 30 sec hold - Seated Piriformis Stretch  - 1 x daily - 7 x weekly - 2 sets - 30 sec hold - Sidelying Hip Abduction  - 1 x daily - 7 x weekly - 2 sets - 10 reps - Prone Hip Extension - Two Pillows  - 1 x daily - 7 x weekly - 2 sets - 10 reps - Seated Straight Leg Raise with Support  - 1 x daily - 7 x weekly - 2 sets - 10 reps - Quadricep Stretch with Chair and Counter Support  - 1 x daily - 7 x weekly - 1 sets - 3 reps - 30 sec hold  ASSESSMENT:  CLINICAL IMPRESSION: Sharnice is beginning to make progress but seems to have continued fear of falling and avoids proper weight shift onto right LE.  We focused a lot on weight bearing and weight shifting activities today.  She is able to bear weight when distracted but becomes fearful if overly focused on weight shifting tasks.  She was encouraged to use her cane more at home to begin weaning from walker.  She was also encouraged to practice her weight shifting activities from today at her kitchen sink.  She would benefit from continued skilled PT for right hip strengthening and flexibility along with weight shifting.    OBJECTIVE IMPAIRMENTS: Abnormal gait,  decreased activity tolerance, decreased balance, decreased endurance, decreased mobility, difficulty walking, decreased ROM, decreased strength, hypomobility, increased fascial restrictions, impaired flexibility, improper body mechanics, postural dysfunction, and pain.   GOALS: Goals reviewed with patient? Yes  SHORT TERM GOALS: Target date: 01/12/2023  Pt will be ind with initial HEP Baseline: Goal status: MET  2.  PT will assess 5x STS and set goals accordingly Baseline:  Goal status: MET  3.  PT will assess Berg Balance and set goals accordingly Baseline:  Goal status: MET  4.  Pt will be able to improve R hip flexion to at least 100 deg in sitting Baseline:  01/23/23: 100 deg AROM after stretching Goal status: MET   LONG TERM GOALS: Target date: 04/03/2023   Pt will be ind with management and progression of HEP Baseline:  Goal status: IN PROGRESS  2.  Pt will have 5x STS of </=10 sec to demo decreased risk of falls and improved functional LE strength Baseline: 12.96 sec 01/23/23: 10 sec Goal status: IN PROGRESS  3.  Pt will have improved Berg Balance Score by at least 8 points to demo MCID for decreased fall risk Baseline: 40/56 01/23/23: 47/56 Goal status: IN PROGRESS  4.  Pt will be able to perform household amb (at least 400') without a/d  Baseline:  02/09/23: Walking with cane only when someone is at home for supervision; otherwise walking with rollator x10 min twice a day Goal status: IN PROGRESS  5.  Pt will be able to tolerate standing for at least 20 minutes to be able to cook/wash dishes Baseline:  Goal status: MET 02/09/23  6.  Pt will have improved  FOTO to >/=53 Baseline: 02/09/23: 46 Goal status: MET on 02/11/2023   PLAN:  PT FREQUENCY: 2x/week  PT DURATION: 8 weeks  PLANNED INTERVENTIONS: Therapeutic exercises, Therapeutic activity, Neuromuscular re-education, Balance training, Gait training, Patient/Family education, Self Care, Joint mobilization,  Stair training, Aquatic Therapy, Cryotherapy, Moist heat, Taping, Ionotophoresis 4mg /ml Dexamethasone, Manual therapy, and Re-evaluation  PLAN FOR NEXT SESSION:  Continue glut med strengthening, Continue to work on hip ROM and strengthening especially with hip ER and hip abduction.  Emphasize flexibility.  Work on balance and weight shifting. DN as indicated for right quad and abductors.     Victorino Dike B. Jamaurion Slemmer, PT 03/31/23 7:57 PM [facility] [address] [place], [place] [address] Phone # [phone] Fax [phone]

## 2023-04-02 ENCOUNTER — Ambulatory Visit: Payer: Medicare Other

## 2023-04-02 DIAGNOSIS — M6281 Muscle weakness (generalized): Secondary | ICD-10-CM | POA: Diagnosis not present

## 2023-04-02 DIAGNOSIS — M25651 Stiffness of right hip, not elsewhere classified: Secondary | ICD-10-CM

## 2023-04-02 DIAGNOSIS — M25551 Pain in right hip: Secondary | ICD-10-CM | POA: Diagnosis not present

## 2023-04-02 DIAGNOSIS — R262 Difficulty in walking, not elsewhere classified: Secondary | ICD-10-CM | POA: Diagnosis not present

## 2023-04-02 DIAGNOSIS — R252 Cramp and spasm: Secondary | ICD-10-CM | POA: Diagnosis not present

## 2023-04-02 DIAGNOSIS — R2689 Other abnormalities of gait and mobility: Secondary | ICD-10-CM | POA: Diagnosis not present

## 2023-04-02 NOTE — Therapy (Signed)
OUTPATIENT PHYSICAL THERAPY TREATMENT   Patient Name: Carrie Barnes MRN: [id number] DOB:February 04, 1936, 87 y.o., female Today's Date: 04/02/2023  END OF SESSION:  PT End of Session - 04/02/23 1217     Visit Number 25    Date for PT Re-Evaluation 05/28/23    Authorization Type BCBS Medicare    Progress Note Due on Visit 30    PT Start Time 1105    PT Stop Time 1146    PT Time Calculation (min) 41 min    Activity Tolerance Patient tolerated treatment well    Behavior During Therapy [facility] for tasks assessed/performed                  Past Medical History:  Diagnosis Date   Cholecystitis    Coronary artery disease    a. 03/2017: 80-90% LAD stenosis (PCI/DES placement with a 2.75x16 mm Promus Premier stent). No significant stenosis along RCA or LCx.    DJD (degenerative joint disease)    GERD (gastroesophageal reflux disease)    Hyperlipidemia    Hypertension    dx s/p MI    Ischemic cardiomyopathy    Myocardial infarction (HCC) 03/17/2017   03-2017 treated at [facility]    Osteoporosis    Status post insertion of drug-eluting stent into left anterior descending (LAD) artery 03/2017   Past Surgical History:  Procedure Laterality Date   APPENDECTOMY     CESAREAN SECTION     CHOLECYSTECTOMY N/A 11/05/2017   Procedure: LAPAROSCOPIC CHOLECYSTECTOMY WITH INTRAOPERATIVE CHOLANGIOGRAM;  Surgeon: Darnell Level, MD;  Location: [facility];  Service: General;  Laterality: N/A;   ERCP N/A 11/11/2017   Procedure: ENDOSCOPIC RETROGRADE CHOLANGIOPANCREATOGRAPHY (ERCP);  Surgeon: Willis Modena, MD;  Location: [facility];  Service: Endoscopy;  Laterality: N/A;  possible   EUS N/A 11/11/2017   Procedure: UPPER ENDOSCOPIC ULTRASOUND (EUS) RADIAL;  Surgeon: Willis Modena, MD;  Location: [facility];  Service: Endoscopy;  Laterality: N/A;   FEMUR IM NAIL Right 07/13/2019   Procedure: INTRAMEDULLARY (IM) RETROGRADE FEMORAL NAILING;  Surgeon: Ollen Gross, MD;  Location:  [facility];  Service: Orthopedics;  Laterality: Right;    LUMBAR LAMINECTOMY/ DECOMPRESSION WITH MET-RX     OPEN REDUCTION INTERNAL FIXATION ACETABULAR FRACTURE STOPPA Right 05/14/2022   Procedure: OPEN REDUCTION INTERNAL FIXATION RIGHT ACETABULUM;  Surgeon: Roby Lofts, MD;  Location: [facility] OR;  Service: Orthopedics;  Laterality: Right;   ORIF FEMUR FRACTURE Right 08/21/2022   Procedure: RIGHT OPEN REDUCTION INTERNAL FIXATION (ORIF) DISTAL FEMUR FRACTURE;  Surgeon: Roby Lofts, MD;  Location: [facility] OR;  Service: Orthopedics;  Laterality: Right;   REVERSE SHOULDER ARTHROPLASTY Right 05/15/2022   Procedure: RIGHT REVERSE SHOULDER ARTHROPLASTY;  Surgeon: Yolonda Kida, MD;  [facility];  Service: Orthopedics;  Laterality: Right;   SHOULDER ARTHROSCOPY WITH ROTATOR CUFF REPAIR AND SUBACROMIAL DECOMPRESSION Right 09/20/2021   Procedure: SHOULDER MINI OPEN ROTATOR CUFF REPAIR AND SUBACROMIAL DECOMPRESSION WITH POSSIBLE PATCH GRAFT;  Surgeon: Jene Every, MD;  Location: [facility];  Service: Orthopedics;  Laterality: Right;  90 MINS   stent  Left 03/2017   anterior descending ; drug eluting ; tx of MI at [facility]    TONSILLECTOMY     VAGINAL HYSTERECTOMY     vaginal sling     Patient Active Problem List   Diagnosis Date Noted   Closed bicondylar fracture of distal femur (HCC) 08/20/2022   Hyponatremia 08/20/2022   Hypomagnesemia 05/13/2022   Pelvis fracture (HCC) 05/12/2022  Humeral head fracture, right, closed, initial encounter 05/12/2022   Elevated troponin 05/12/2022   Hematoma 05/12/2022   Hypertension 11/05/2021   Complete rotator cuff tear 09/20/2021   Closed intertrochanteric fracture of right hip (HCC) 07/11/2019   Closed right hip fracture (HCC) 07/11/2019   Chronic cholecystitis due to cholelithiasis with choledocholithiasis 11/05/2017   Cholelithiasis with chronic cholecystitis 11/01/2017   Cholelithiasis 05/18/2017   CAD in native artery 03/31/2017    Ischemic cardiomyopathy 03/31/2017   Hyperlipemia 03/31/2017   DJD (degenerative joint disease) 03/31/2017   Osteoporosis 03/31/2017    PCP: Renford Dills, MD  REFERRING PROVIDER: Dion Saucier D, PA  REFERRING DIAG: ORIF R Distal Femur FX 08/21/22 ORIF R Acetabulum FX 05/14/22  THERAPY DIAG:  Muscle weakness (generalized)  Pain in right hip  Stiffness of right hip, not elsewhere classified  Difficulty in walking, not elsewhere classified  Cramp and spasm  Rationale for Evaluation and Treatment: Rehabilitation  ONSET DATE: R hip ORIF 08/21/22  SUBJECTIVE:   SUBJECTIVE STATEMENT: Patient states she was sore after last visit but it resolved day after at about mid day.  She states she is trying to use her cane at home more.  I do tend to walk without any assistive device more if I am doing something in the kitchen.   PERTINENT HISTORY: From H&P: 87 yo female with history of CAD, ischemic cardiomyopathy, paroxysmal atrial fibrillation, HTN, HLD, PACs and spinal stenosis 3rd R hip fx, R shoulder fx  PAIN:  04/02/23 Are you having pain? Yes: NPRS scale: 4/10 Pain location: R front thigh and knee Pain description: dull Aggravating factors: standing/walking Relieving factors: rest  PRECAUTIONS: None  WEIGHT BEARING RESTRICTIONS: No  FALLS:  Has patient fallen in last 6 months? Yes. Number of falls 2  LIVING ENVIRONMENT: Lives with: lives alone Daughter lives close; family checks in daily Lives in: House/apartment Stairs: No Has following equipment at home: Dan Humphreys - 4 wheeled, shower chair, bed side commode, and Ramped entry  OCCUPATION: Retired  PATIENT GOALS: "I just want to get stronger and not be susceptible to losing balance"  NEXT MD VISIT: n/a  OBJECTIVE: (Measures in this section from initial evaluation unless otherwise noted)  DIAGNOSTIC FINDINGS: 08/21/22 hip x-ray demos ORIF  PATIENT SURVEYS:  Eval:  FOTO 40; predicted 53 02/11/2023:  FOTO 59  (goal met)  EDEMA:  Notes a little R posterior thigh swelling  MUSCLE LENGTH: Hamstrings: Right ~70 deg; Left ~70 deg Thomas test: Right -10 deg; Left 10 deg  POSTURE: flexed trunk   LOWER EXTREMITY ROM:  Active/Passive ROM Right eval Left eval Right 01/09/23 Right 03/17/23  Hip flexion 80/100 125/125 100/125 110/128    LOWER EXTREMITY MMT:  MMT Right eval Left eval Right 02/09/23 Right 03/17/23 Right 04/02/23  Hip flexion 3-* 5 3+ within available range  3+ 4-  Hip extension 3- 3+ 3 3+ 4-  Hip abduction 2+ 3- 3 3+ 4-  Hip adduction       Hip internal rotation       Hip external rotation       Knee flexion 4+ 5 5 5 5   Knee extension 3+ 5 4 4 4   Ankle dorsiflexion       Ankle plantarflexion       Ankle inversion       Ankle eversion        (Blank rows = not tested) * = concordant pain  FUNCTIONAL TESTS:  Eval: 5 times sit to stand: 12.95 sec  Berg Balance test: 40/56  01/23/23 5 times sit<>stand: 10 sec Berg Balance test: 47/56  02/11/2023: 5 times sit to stand: 10.94 sec without UE Timed Up and Go (TUG):  12.24 sec with 4WRW 3 minute walk test:  481 ft with RPE of 8/10  03/17/2023: 5 times sit to stand: 10.85 sec without UE Timed Up and Go (TUG):  10.53 sec with 4WRW 3 minute walk test:  507.2 ft with RPE of 4/10   04/02/2023: 5 times sit to stand: 10.79 sec without UE Timed Up and Go (TUG):  10.72 sec with 4WRW 3 minute walk test:  338.2 ft with RPE of 4/10 with Cane! GAIT: Distance walked: 100' Assistive device utilized: Environmental consultant - 4 wheeled Level of assistance: Modified independence Comments: Reciprocal gait, flexed trunk   TODAY'S TREATMENT:   DATE: 03/31/23 Nustep L1 x 5 min (legs only) Recert assessment completed Hip matrix with 30 lbs 2 x 10 each direction hip extension and abduction both LE's Review of walking schedule and use of walker vs cane  DATE: 03/31/23 Nustep L1 x 5 min (legs only) At barre:  standing heel raises with weight shift to  right At barre: SLS with bilateral UE support x 10 holding 10 sec each Seated LAQ 0# x 20 Seated march x 20 0# Alternating LE cone taps x 20 (initially with UE support, then had patient move away from barre and offered +1 min assist) x 20 total Lateral band walks with green loop around ankles x 5 laps Seated clam with green loop x 20 Standing hip ER with green loop x 20 Sit to stand with green loop 2 x 5 with emphasis on hip abduction (verbal cues to keep space between her thighs) Leg press double legs 50# 2x10 (patient could not do 85# and really struggled with 50#) Leg press single leg Left 50# 2 x 10, Right 50# 2 x10  Prone manual quad, hip flexor stretch 3 x 30 sec right  DATE: 03/27/23 Recumbent bike L1 x 5 min Seated hip flexor stretch 2x 30 sec Seated hip extension with leg off EOB 2x10 Hip flexor/ITB stretch with contract/relax Manual IASTM to right IT band Leg press double legs 85# 2x10 Leg press single leg 60# x10, 65# x10 (R) Amb around ortho and cancer gym with [facility] and reciprocal  working on hip extension and heel/toe Backwards walking with SPC 2x25' Side stepping with SPC 2x25' R&L  DATE: 03/24/23 Recumbent bike L1 x 5 min Seated figure 4 stretch with strap x 5 holding 10 sec (right) Supine IT band stretch with strap x 5 holding 10 sec (right) Prone quad/hip flexor stretch x 10 holding 10 sec each (right) Manual IASTM to right IT band x 6 min Manual STM to right IT band x 8 min Trigger Point Dry-Needling  Treatment instructions: Expect mild to moderate muscle soreness. S/S of pneumothorax if dry needled over a lung field, and to seek immediate medical attention should they occur. Patient verbalized understanding of these instructions and education. Patient Consent Given: Yes Education handout provided: Yes Muscles treated: right lateral quad Electrical stimulation performed: No Parameters: N/A Treatment response/outcome: Skilled palpation used to identify taut bands  and trigger points.  Once identified, dry needling techniques used to treat these areas.  Twitch response ellicited along with palpable elongation of muscle.  Following treatment, patient was able to stand and bear weight without pain.  Gait was improved to heel to toe progression.     PATIENT EDUCATION:  Education  details: Exam findings, POC, initial HEP Person educated: Patient Education method: Explanation, Demonstration, and Handouts Education comprehension: verbalized understanding, returned demonstration, and needs further education  HOME EXERCISE PROGRAM: Access Code: [email or web address] URL: [email or web address] Date: 02/19/2023 Prepared by: Mikey Kirschner  Exercises - Figure 4 Bridge (Mirrored)  - 1 x daily - 7 x weekly - 2 sets - 10 reps - Standing Hamstring Curl with Resistance  - 1 x daily - 7 x weekly - 2 sets - 10 reps - Sidelying Bent Knee Hip Flexion  - 1 x daily - 7 x weekly - 2 sets - 10 reps - Standing 'L' Stretch at Counter  - 1 x daily - 7 x weekly - 2 sets - 30 sec hold - Standing Lumbar Extension with Counter  - 1 x daily - 7 x weekly - 1 sets - 5 reps - 5 sec hold - Seated Piriformis Stretch  - 1 x daily - 7 x weekly - 2 sets - 30 sec hold - Seated Piriformis Stretch  - 1 x daily - 7 x weekly - 2 sets - 30 sec hold - Sidelying Hip Abduction  - 1 x daily - 7 x weekly - 2 sets - 10 reps - Prone Hip Extension - Two Pillows  - 1 x daily - 7 x weekly - 2 sets - 10 reps - Seated Straight Leg Raise with Support  - 1 x daily - 7 x weekly - 2 sets - 10 reps - Quadricep Stretch with Chair and Counter Support  - 1 x daily - 7 x weekly - 1 sets - 3 reps - 30 sec hold  ASSESSMENT:  CLINICAL IMPRESSION: Carrie Barnes had some soreness after the increased weight shifting activities last visit.  We did her reassessment today and some gait training while doing walk test with SPC.  She ambulates with excellent heel to toe progression at a good pace with the cane.  Therefore, we  discussed her hesitation to transition from the walker.  She mentions that when she was at an MD appt after being put on Prolia, (daughter was with her), MD mentioned she should likely use the walker for the rest of her life.  We discussed the pros and cons of this.  Suggested she use the cane to work out her gait abnormalities at home and anytime someone is with her.  If she is going to be out for a long period of time, the walker would be appropriate.  Explained that her and her family's concerns are warranted based on her bone density issues.  Ultimately, it is her choice to use walker vs cane.  She agrees and states she will likely use cane most of the time but walker if she is going out for long periods of time due to the pain she is still having.   She would benefit from continued skilled PT for right hip strengthening and flexibility along with weight shifting.    OBJECTIVE IMPAIRMENTS: Abnormal gait, decreased activity tolerance, decreased balance, decreased endurance, decreased mobility, difficulty walking, decreased ROM, decreased strength, hypomobility, increased fascial restrictions, impaired flexibility, improper body mechanics, postural dysfunction, and pain.   GOALS: Goals reviewed with patient? Yes  SHORT TERM GOALS: Target date: 01/12/2023  Pt will be ind with initial HEP Baseline: Goal status: MET  2.  PT will assess 5x STS and set goals accordingly Baseline:  Goal status: MET  3.  PT will assess Berg Balance and set goals accordingly Baseline:  Goal status: MET  4.  Pt will be able to improve R hip flexion to at least 100 deg in sitting Baseline:  01/23/23: 100 deg AROM after stretching Goal status: MET   LONG TERM GOALS: Target date: 04/03/2023   Pt will be ind with management and progression of HEP Baseline:  Goal status: IN PROGRESS  2.  Pt will have 5x STS of </=10 sec to demo decreased risk of falls and improved functional LE strength Baseline: 12.96  sec 01/23/23: 10 sec Goal status: IN PROGRESS  3.  Pt will have improved Berg Balance Score by at least 8 points to demo MCID for decreased fall risk Baseline: 40/56 01/23/23: 47/56 Goal status: IN PROGRESS  4.  Pt will be able to perform household amb (at least 400') without a/d  Baseline:  02/09/23: Walking with cane only when someone is at home for supervision; otherwise walking with rollator x10 min twice a day Goal status: IN PROGRESS  5.  Pt will be able to tolerate standing for at least 20 minutes to be able to cook/wash dishes Baseline:  Goal status: MET 02/09/23  6.  Pt will have improved FOTO to >/=53 Baseline: 02/09/23: 46 Goal status: MET on 02/11/2023   PLAN:  PT FREQUENCY: 1-2x/week  PT DURATION: 8 weeks  PLANNED INTERVENTIONS: Therapeutic exercises, Therapeutic activity, Neuromuscular re-education, Balance training, Gait training, Patient/Family education, Self Care, Joint mobilization, Stair training, Aquatic Therapy, Cryotherapy, Moist heat, Taping, Ionotophoresis 4mg /ml Dexamethasone, Manual therapy, and Re-evaluation  PLAN FOR NEXT SESSION: Recertifying for 8 more weeks due to continued wb restriction and thigh pain.  Continue glut med strengthening, Continue to work on hip ROM and strengthening especially with hip ER and hip abduction.  Emphasize flexibility.  Work on balance and weight shifting. DN as indicated for right quad and abductors.  We will start with 2 times per week but likely wean down to 1 time per week.     Victorino Dike B. Deloise Marchant, PT 04/02/23 4:12 PM [facility] [address] [place], [place] [address] Phone # [phone] Fax [phone]

## 2023-04-09 ENCOUNTER — Ambulatory Visit: Payer: Medicare Other

## 2023-04-09 DIAGNOSIS — M25551 Pain in right hip: Secondary | ICD-10-CM

## 2023-04-09 DIAGNOSIS — M6281 Muscle weakness (generalized): Secondary | ICD-10-CM

## 2023-04-09 DIAGNOSIS — R252 Cramp and spasm: Secondary | ICD-10-CM | POA: Diagnosis not present

## 2023-04-09 DIAGNOSIS — R300 Dysuria: Secondary | ICD-10-CM | POA: Diagnosis not present

## 2023-04-09 DIAGNOSIS — R2689 Other abnormalities of gait and mobility: Secondary | ICD-10-CM

## 2023-04-09 DIAGNOSIS — R262 Difficulty in walking, not elsewhere classified: Secondary | ICD-10-CM

## 2023-04-09 DIAGNOSIS — M25651 Stiffness of right hip, not elsewhere classified: Secondary | ICD-10-CM

## 2023-04-09 NOTE — Therapy (Signed)
OUTPATIENT PHYSICAL THERAPY TREATMENT   Patient Name: Carrie Barnes MRN: [id number] DOB:16-May-1936, 87 y.o., female Today's Date: 04/09/2023  END OF SESSION:  PT End of Session - 04/09/23 1104     Visit Number 26    Date for PT Re-Evaluation 05/28/23    Authorization Type BCBS Medicare    PT Start Time 1103    PT Stop Time 1145    PT Time Calculation (min) 42 min    Activity Tolerance Patient tolerated treatment well    Behavior During Therapy [facility] for tasks assessed/performed                  Past Medical History:  Diagnosis Date   Cholecystitis    Coronary artery disease    a. 03/2017: 80-90% LAD stenosis (PCI/DES placement with a 2.75x16 mm Promus Premier stent). No significant stenosis along RCA or LCx.    DJD (degenerative joint disease)    GERD (gastroesophageal reflux disease)    Hyperlipidemia    Hypertension    dx s/p MI    Ischemic cardiomyopathy    Myocardial infarction (HCC) 03/17/2017   03-2017 treated at [facility]    Osteoporosis    Status post insertion of drug-eluting stent into left anterior descending (LAD) artery 03/2017   Past Surgical History:  Procedure Laterality Date   APPENDECTOMY     CESAREAN SECTION     CHOLECYSTECTOMY N/A 11/05/2017   Procedure: LAPAROSCOPIC CHOLECYSTECTOMY WITH INTRAOPERATIVE CHOLANGIOGRAM;  Surgeon: Darnell Level, MD;  Location: [facility];  Service: General;  Laterality: N/A;   ERCP N/A 11/11/2017   Procedure: ENDOSCOPIC RETROGRADE CHOLANGIOPANCREATOGRAPHY (ERCP);  Surgeon: Willis Modena, MD;  Location: [facility];  Service: Endoscopy;  Laterality: N/A;  possible   EUS N/A 11/11/2017   Procedure: UPPER ENDOSCOPIC ULTRASOUND (EUS) RADIAL;  Surgeon: Willis Modena, MD;  Location: [facility];  Service: Endoscopy;  Laterality: N/A;   FEMUR IM NAIL Right 07/13/2019   Procedure: INTRAMEDULLARY (IM) RETROGRADE FEMORAL NAILING;  Surgeon: Ollen Gross, MD;  Location: [facility];  Service: Orthopedics;   Laterality: Right;    LUMBAR LAMINECTOMY/ DECOMPRESSION WITH MET-RX     OPEN REDUCTION INTERNAL FIXATION ACETABULAR FRACTURE STOPPA Right 05/14/2022   Procedure: OPEN REDUCTION INTERNAL FIXATION RIGHT ACETABULUM;  Surgeon: Roby Lofts, MD;  Location: [facility] OR;  Service: Orthopedics;  Laterality: Right;   ORIF FEMUR FRACTURE Right 08/21/2022   Procedure: RIGHT OPEN REDUCTION INTERNAL FIXATION (ORIF) DISTAL FEMUR FRACTURE;  Surgeon: Roby Lofts, MD;  Location: [facility] OR;  Service: Orthopedics;  Laterality: Right;   REVERSE SHOULDER ARTHROPLASTY Right 05/15/2022   Procedure: RIGHT REVERSE SHOULDER ARTHROPLASTY;  Surgeon: Yolonda Kida, MD;  Location: [facility] OR;  Service: Orthopedics;  Laterality: Right;   SHOULDER ARTHROSCOPY WITH ROTATOR CUFF REPAIR AND SUBACROMIAL DECOMPRESSION Right 09/20/2021   Procedure: SHOULDER MINI OPEN ROTATOR CUFF REPAIR AND SUBACROMIAL DECOMPRESSION WITH POSSIBLE PATCH GRAFT;  Surgeon: Jene Every, MD;  Location: [facility];  Service: Orthopedics;  Laterality: Right;  90 MINS   stent  Left 03/2017   anterior descending ; drug eluting ; tx of MI at [facility]    TONSILLECTOMY     VAGINAL HYSTERECTOMY     vaginal sling     Patient Active Problem List   Diagnosis Date Noted   Closed bicondylar fracture of distal femur (HCC) 08/20/2022   Hyponatremia 08/20/2022   Hypomagnesemia 05/13/2022   Pelvis fracture (HCC) 05/12/2022   Humeral head fracture, right, closed, initial encounter  05/12/2022   Elevated troponin 05/12/2022   Hematoma 05/12/2022   Hypertension 11/05/2021   Complete rotator cuff tear 09/20/2021   Closed intertrochanteric fracture of right hip (HCC) 07/11/2019   Closed right hip fracture (HCC) 07/11/2019   Chronic cholecystitis due to cholelithiasis with choledocholithiasis 11/05/2017   Cholelithiasis with chronic cholecystitis 11/01/2017   Cholelithiasis 05/18/2017   CAD in native artery 03/31/2017   Ischemic cardiomyopathy  03/31/2017   Hyperlipemia 03/31/2017   DJD (degenerative joint disease) 03/31/2017   Osteoporosis 03/31/2017    PCP: Renford Dills, MD  REFERRING PROVIDER: Dion Saucier D, PA  REFERRING DIAG: ORIF R Distal Femur FX 08/21/22 ORIF R Acetabulum FX 05/14/22  THERAPY DIAG:  Muscle weakness (generalized)  Pain in right hip  Stiffness of right hip, not elsewhere classified  Difficulty in walking, not elsewhere classified  Cramp and spasm  Other abnormalities of gait and mobility  Rationale for Evaluation and Treatment: Rehabilitation  ONSET DATE: R hip ORIF 08/21/22  SUBJECTIVE:   SUBJECTIVE STATEMENT: Patient states she is doing ok, I'm just tired from going through my things and down sizing for her move to friends homes.    PERTINENT HISTORY: From H&P: 87 yo female with history of CAD, ischemic cardiomyopathy, paroxysmal atrial fibrillation, HTN, HLD, PACs and spinal stenosis 3rd R hip fx, R shoulder fx  PAIN:  04/02/23 Are you having pain? Yes: NPRS scale: 4/10 Pain location: R front thigh and knee Pain description: dull Aggravating factors: standing/walking Relieving factors: rest  PRECAUTIONS: None  WEIGHT BEARING RESTRICTIONS: No  FALLS:  Has patient fallen in last 6 months? Yes. Number of falls 2  LIVING ENVIRONMENT: Lives with: lives alone Daughter lives close; family checks in daily Lives in: House/apartment Stairs: No Has following equipment at home: Dan Humphreys - 4 wheeled, shower chair, bed side commode, and Ramped entry  OCCUPATION: Retired  PATIENT GOALS: "I just want to get stronger and not be susceptible to losing balance"  NEXT MD VISIT: n/a  OBJECTIVE: (Measures in this section from initial evaluation unless otherwise noted)  DIAGNOSTIC FINDINGS: 08/21/22 hip x-ray demos ORIF  PATIENT SURVEYS:  Eval:  FOTO 40; predicted 53 02/11/2023:  FOTO 59 (goal met)  EDEMA:  Notes a little R posterior thigh swelling  MUSCLE LENGTH: Hamstrings: Right  ~70 deg; Left ~70 deg Thomas test: Right -10 deg; Left 10 deg  POSTURE: flexed trunk   LOWER EXTREMITY ROM:  Active/Passive ROM Right eval Left eval Right 01/09/23 Right 03/17/23  Hip flexion 80/100 125/125 100/125 110/128    LOWER EXTREMITY MMT:  MMT Right eval Left eval Right 02/09/23 Right 03/17/23 Right 04/02/23  Hip flexion 3-* 5 3+ within available range  3+ 4-  Hip extension 3- 3+ 3 3+ 4-  Hip abduction 2+ 3- 3 3+ 4-  Hip adduction       Hip internal rotation       Hip external rotation       Knee flexion 4+ 5 5 5 5   Knee extension 3+ 5 4 4 4   Ankle dorsiflexion       Ankle plantarflexion       Ankle inversion       Ankle eversion        (Blank rows = not tested) * = concordant pain  FUNCTIONAL TESTS:  Eval: 5 times sit to stand: 12.95 sec Berg Balance test: 40/56  01/23/23 5 times sit<>stand: 10 sec Berg Balance test: 47/56  02/11/2023: 5 times sit to stand: 10.94 sec without  UE Timed Up and Go (TUG):  12.24 sec with 4WRW 3 minute walk test:  481 ft with RPE of 8/10  03/17/2023: 5 times sit to stand: 10.85 sec without UE Timed Up and Go (TUG):  10.53 sec with 4WRW 3 minute walk test:  507.2 ft with RPE of 4/10   04/02/2023: 5 times sit to stand: 10.79 sec without UE Timed Up and Go (TUG):  10.72 sec with 4WRW 3 minute walk test:  338.2 ft with RPE of 4/10 with Cane! GAIT: Distance walked: 100' Assistive device utilized: Environmental consultant - 4 wheeled Level of assistance: Modified independence Comments: Reciprocal gait, flexed trunk   TODAY'S TREATMENT:   DATE: 04/09/23 Nustep x 5 min level 3 (PT present to discuss status) Prone right hip flexor and quad stretch, tried new technique off edge of bed with left foot on floor, using strap 3 x 30 sec Alternating cone taps x 20 without UE support with cga from PT Alternating tap ups onto 2" step x 20 without UE support and without PT assist Up and over 2" step side stepping x 20 Seated LAQ 2 x 10 each LE Cone touches  with cone on counter in room.  Left foot in back on 2 inch step, right foot fwd 3 x 10 Prone over edge of table hip extension 2 x 10 Side lying hip abduction with PT providing manual blocking to hip flexion and trunk rolling to isolate the right hip abductor 2 x 10  Trigger Point Dry-Needling  Treatment instructions: Expect mild to moderate muscle soreness. S/S of pneumothorax if dry needled over a lung field, and to seek immediate medical attention should they occur. Patient verbalized understanding of these instructions and education. Patient Consent Given: Yes Education handout provided: Yes Muscles treated: right lateral quad Electrical stimulation performed: No Parameters: N/A Treatment response/outcome: Skilled palpation used to identify taut bands and trigger points.  Once identified, dry needling techniques used to treat these areas.  Twitch response ellicited along with palpable elongation of muscle.  Following treatment, patient demonstrates non antalgic gait and reports minimal pain.      DATE: 03/31/23 Nustep L1 x 5 min (legs only) Recert assessment completed Hip matrix with 30 lbs 2 x 10 each direction hip extension and abduction both LE's Review of walking schedule and use of walker vs cane  DATE: 03/31/23 Nustep L1 x 5 min (legs only) At barre:  standing heel raises with weight shift to right At barre: SLS with bilateral UE support x 10 holding 10 sec each Seated LAQ 0# x 20 Seated march x 20 0# Alternating LE cone taps x 20 (initially with UE support, then had patient move away from barre and offered +1 min assist) x 20 total Lateral band walks with green loop around ankles x 5 laps Seated clam with green loop x 20 Standing hip ER with green loop x 20 Sit to stand with green loop 2 x 5 with emphasis on hip abduction (verbal cues to keep space between her thighs) Leg press double legs 50# 2x10 (patient could not do 85# and really struggled with 50#) Leg press single leg  Left 50# 2 x 10, Right 50# 2 x10  Prone manual quad, hip flexor stretch 3 x 30 sec right  DATE: 03/27/23 Recumbent bike L1 x 5 min Seated hip flexor stretch 2x 30 sec Seated hip extension with leg off EOB 2x10 Hip flexor/ITB stretch with contract/relax Manual IASTM to right IT band Leg press double legs  85# 2x10 Leg press single leg 60# x10, 65# x10 (R) Amb around ortho and cancer gym with [facility] and reciprocal  working on hip extension and heel/toe Backwards walking with SPC 2x25' Side stepping with SPC 2x25' R&L  DATE: 03/24/23 Recumbent bike L1 x 5 min Seated figure 4 stretch with strap x 5 holding 10 sec (right) Supine IT band stretch with strap x 5 holding 10 sec (right) Prone quad/hip flexor stretch x 10 holding 10 sec each (right) Manual IASTM to right IT band x 6 min Manual STM to right IT band x 8 min Trigger Point Dry-Needling  Treatment instructions: Expect mild to moderate muscle soreness. S/S of pneumothorax if dry needled over a lung field, and to seek immediate medical attention should they occur. Patient verbalized understanding of these instructions and education. Patient Consent Given: Yes Education handout provided: Yes Muscles treated: right lateral quad Electrical stimulation performed: No Parameters: N/A Treatment response/outcome: Skilled palpation used to identify taut bands and trigger points.  Once identified, dry needling techniques used to treat these areas.  Twitch response ellicited along with palpable elongation of muscle.  Following treatment, patient was able to stand and bear weight without pain.  Gait was improved to heel to toe progression.     PATIENT EDUCATION:  Education details: Exam findings, POC, initial HEP Person educated: Patient Education method: Explanation, Demonstration, and Handouts Education comprehension: verbalized understanding, returned demonstration, and needs further education  HOME EXERCISE PROGRAM: Access Code:  [email or web address] URL: [email or web address] Date: 02/19/2023 Prepared by: Mikey Kirschner  Exercises - Figure 4 Bridge (Mirrored)  - 1 x daily - 7 x weekly - 2 sets - 10 reps - Standing Hamstring Curl with Resistance  - 1 x daily - 7 x weekly - 2 sets - 10 reps - Sidelying Bent Knee Hip Flexion  - 1 x daily - 7 x weekly - 2 sets - 10 reps - Standing 'L' Stretch at Counter  - 1 x daily - 7 x weekly - 2 sets - 30 sec hold - Standing Lumbar Extension with Counter  - 1 x daily - 7 x weekly - 1 sets - 5 reps - 5 sec hold - Seated Piriformis Stretch  - 1 x daily - 7 x weekly - 2 sets - 30 sec hold - Seated Piriformis Stretch  - 1 x daily - 7 x weekly - 2 sets - 30 sec hold - Sidelying Hip Abduction  - 1 x daily - 7 x weekly - 2 sets - 10 reps - Prone Hip Extension - Two Pillows  - 1 x daily - 7 x weekly - 2 sets - 10 reps - Seated Straight Leg Raise with Support  - 1 x daily - 7 x weekly - 2 sets - 10 reps - Quadricep Stretch with Chair and Counter Support  - 1 x daily - 7 x weekly - 1 sets - 3 reps - 30 sec hold  ASSESSMENT:  CLINICAL IMPRESSION: Bobette had some soreness after the increased weight shifting activities last visit.  We did her reassessment today and some gait training while doing walk test with SPC.  She ambulates with excellent heel to toe progression at a good pace with the cane.  Therefore, we discussed her hesitation to transition from the walker.  She mentions that when she was at an MD appt after being put on Prolia, (daughter was with her), MD mentioned she should likely use the walker for the rest of her life.  We  discussed the pros and cons of this.  Suggested she use the cane to work out her gait abnormalities at home and anytime someone is with her.  If she is going to be out for a long period of time, the walker would be appropriate.  Explained that her and her family's concerns are warranted based on her bone density issues.  Ultimately, it is her choice to use  walker vs cane.  She agrees and states she will likely use cane most of the time but walker if she is going out for long periods of time due to the pain she is still having.   She would benefit from continued skilled PT for right hip strengthening and flexibility along with weight shifting.    OBJECTIVE IMPAIRMENTS: Abnormal gait, decreased activity tolerance, decreased balance, decreased endurance, decreased mobility, difficulty walking, decreased ROM, decreased strength, hypomobility, increased fascial restrictions, impaired flexibility, improper body mechanics, postural dysfunction, and pain.   GOALS: Goals reviewed with patient? Yes  SHORT TERM GOALS: Target date: 01/12/2023  Pt will be ind with initial HEP Baseline: Goal status: MET  2.  PT will assess 5x STS and set goals accordingly Baseline:  Goal status: MET  3.  PT will assess Berg Balance and set goals accordingly Baseline:  Goal status: MET  4.  Pt will be able to improve R hip flexion to at least 100 deg in sitting Baseline:  01/23/23: 100 deg AROM after stretching Goal status: MET   LONG TERM GOALS: Target date: 04/03/2023   Pt will be ind with management and progression of HEP Baseline:  Goal status: IN PROGRESS  2.  Pt will have 5x STS of </=10 sec to demo decreased risk of falls and improved functional LE strength Baseline: 12.96 sec 01/23/23: 10 sec Goal status: IN PROGRESS  3.  Pt will have improved Berg Balance Score by at least 8 points to demo MCID for decreased fall risk Baseline: 40/56 01/23/23: 47/56 Goal status: IN PROGRESS  4.  Pt will be able to perform household amb (at least 400') without a/d  Baseline:  02/09/23: Walking with cane only when someone is at home for supervision; otherwise walking with rollator x10 min twice a day Goal status: IN PROGRESS  5.  Pt will be able to tolerate standing for at least 20 minutes to be able to cook/wash dishes Baseline:  Goal status: MET 02/09/23  6.  Pt  will have improved FOTO to >/=53 Baseline: 02/09/23: 46 Goal status: MET on 02/11/2023   PLAN:  PT FREQUENCY: 1-2x/week  PT DURATION: 8 weeks  PLANNED INTERVENTIONS: Therapeutic exercises, Therapeutic activity, Neuromuscular re-education, Balance training, Gait training, Patient/Family education, Self Care, Joint mobilization, Stair training, Aquatic Therapy, Cryotherapy, Moist heat, Taping, Ionotophoresis 4mg /ml Dexamethasone, Manual therapy, and Re-evaluation  PLAN FOR NEXT SESSION: Recertifying for 8 more weeks due to continued wb restriction and thigh pain.  Continue glut med strengthening, Continue to work on hip ROM and strengthening especially with hip ER and hip abduction.  Emphasize flexibility.  Work on balance and weight shifting. DN as indicated for right quad and abductors.  We will start with 2 times per week but likely wean down to 1 time per week.     Victorino Dike B. Lunetta Marina, PT 04/09/23 3:20 PM [facility] [address] [place], [place] [address] Phone # [phone] Fax [phone]

## 2023-04-14 DIAGNOSIS — S72401D Unspecified fracture of lower end of right femur, subsequent encounter for closed fracture with routine healing: Secondary | ICD-10-CM | POA: Diagnosis not present

## 2023-04-14 DIAGNOSIS — S32401D Unspecified fracture of right acetabulum, subsequent encounter for fracture with routine healing: Secondary | ICD-10-CM | POA: Diagnosis not present

## 2023-04-16 DIAGNOSIS — J309 Allergic rhinitis, unspecified: Secondary | ICD-10-CM | POA: Diagnosis not present

## 2023-04-16 DIAGNOSIS — D6869 Other thrombophilia: Secondary | ICD-10-CM | POA: Diagnosis not present

## 2023-04-17 ENCOUNTER — Telehealth: Payer: Self-pay | Admitting: Cardiovascular Disease

## 2023-04-17 NOTE — Telephone Encounter (Signed)
**Note De-Identified Naomia Lenderman Obfuscation** The pt and I discussed her switching to Dabigatran (generic for Pradaxa-but still name brand price) and Warfarin (generic for Coumadin) and she is not interested in switching to either of them.  She states that her ins company gave her several phone numbers to contact for Eliquis cost assistance but that [employer] was not one of them.  I provided her with [employer] phone number and recommended that she reach out to them to ask questions about their Eliquis assistance program and her eligibility to be approved for the program.  She is aware that if she decides to apply through [employer] to either request that they mail her an application to her home address or that we can provide her with one and that she will need to complete the pt part of the application, obtain required documents per [employer] and to bring the application to Dr Gibson Ramp office to drop off and that we will take care of the providers page and we will fax all to [employer].  She stated that she plans to call all the phone numbers she has been provided with by her ins company and [employer] and that if she needs assistance from [place] she will contact us. She thanked me for calling her back to discuss her options.

## 2023-04-17 NOTE — Telephone Encounter (Signed)
Pt c/o medication issue:  1. Name of Medication:   apixaban (ELIQUIS) 5 MG TABS tablet   2. How are you currently taking this medication (dosage and times per day)?   As prescribed  3. Are you having a reaction (difficulty breathing--STAT)?  No  4. What is your medication issue?   Patient stated she is in the donut hole with this medication and wants to get alternate medication or next steps.

## 2023-04-22 ENCOUNTER — Ambulatory Visit: Payer: Medicare Other

## 2023-04-28 ENCOUNTER — Ambulatory Visit: Payer: Medicare Other | Attending: Physician Assistant

## 2023-04-28 DIAGNOSIS — R252 Cramp and spasm: Secondary | ICD-10-CM | POA: Diagnosis not present

## 2023-04-28 DIAGNOSIS — M25651 Stiffness of right hip, not elsewhere classified: Secondary | ICD-10-CM

## 2023-04-28 DIAGNOSIS — M25551 Pain in right hip: Secondary | ICD-10-CM | POA: Diagnosis not present

## 2023-04-28 DIAGNOSIS — R262 Difficulty in walking, not elsewhere classified: Secondary | ICD-10-CM

## 2023-04-28 DIAGNOSIS — M6281 Muscle weakness (generalized): Secondary | ICD-10-CM | POA: Diagnosis not present

## 2023-04-28 DIAGNOSIS — R2689 Other abnormalities of gait and mobility: Secondary | ICD-10-CM

## 2023-04-28 DIAGNOSIS — R293 Abnormal posture: Secondary | ICD-10-CM | POA: Diagnosis not present

## 2023-04-28 NOTE — Therapy (Signed)
OUTPATIENT PHYSICAL THERAPY TREATMENT   Patient Name: Carrie Barnes MRN: [id number] DOB:[birth year], 87 y.o., female Today's Date: 04/28/2023  END OF SESSION:  PT End of Session - 04/28/23 1015     Visit Number 27    Date for PT Re-Evaluation 05/28/23    Authorization Type BCBS Medicare    PT Start Time 1014    PT Stop Time 1100    PT Time Calculation (min) 46 min    Activity Tolerance Patient tolerated treatment well    Behavior During Therapy [facility] for tasks assessed/performed                  Past Medical History:  Diagnosis Date   Cholecystitis    Coronary artery disease    a. 03/2017: 80-90% LAD stenosis (PCI/DES placement with a 2.75x16 mm Promus Premier stent). No significant stenosis along RCA or LCx.    DJD (degenerative joint disease)    GERD (gastroesophageal reflux disease)    Hyperlipidemia    Hypertension    dx s/p MI    Ischemic cardiomyopathy    Myocardial infarction (HCC) 03/17/2017   03-2017 treated at [facility]    Osteoporosis    Status post insertion of drug-eluting stent into left anterior descending (LAD) artery 03/2017   Past Surgical History:  Procedure Laterality Date   APPENDECTOMY     CESAREAN SECTION     CHOLECYSTECTOMY N/A 11/05/2017   Procedure: LAPAROSCOPIC CHOLECYSTECTOMY WITH INTRAOPERATIVE CHOLANGIOGRAM;  Surgeon: Darnell Level, MD;  Location: [facility];  Service: General;  Laterality: N/A;   ERCP N/A 11/11/2017   Procedure: ENDOSCOPIC RETROGRADE CHOLANGIOPANCREATOGRAPHY (ERCP);  Surgeon: Willis Modena, MD;  Location: [facility];  Service: Endoscopy;  Laterality: N/A;  possible   EUS N/A 11/11/2017   Procedure: UPPER ENDOSCOPIC ULTRASOUND (EUS) RADIAL;  Surgeon: Willis Modena, MD;  Location: [facility];  Service: Endoscopy;  Laterality: N/A;   FEMUR IM NAIL Right 07/13/2019   Procedure: INTRAMEDULLARY (IM) RETROGRADE FEMORAL NAILING;  Surgeon: Ollen Gross, MD;  Location: [facility];  Service: Orthopedics;   Laterality: Right;    LUMBAR LAMINECTOMY/ DECOMPRESSION WITH MET-RX     OPEN REDUCTION INTERNAL FIXATION ACETABULAR FRACTURE STOPPA Right 05/14/2022   Procedure: OPEN REDUCTION INTERNAL FIXATION RIGHT ACETABULUM;  Surgeon: Roby Lofts, MD;  Location: [facility] OR;  Service: Orthopedics;  Laterality: Right;   ORIF FEMUR FRACTURE Right 08/21/2022   Procedure: RIGHT OPEN REDUCTION INTERNAL FIXATION (ORIF) DISTAL FEMUR FRACTURE;  Surgeon: Roby Lofts, MD;  Location: [facility] OR;  Service: Orthopedics;  Laterality: Right;   REVERSE SHOULDER ARTHROPLASTY Right 05/15/2022   Procedure: RIGHT REVERSE SHOULDER ARTHROPLASTY;  Surgeon: Yolonda Kida, MD;  Location: [facility] OR;  Service: Orthopedics;  Laterality: Right;   SHOULDER ARTHROSCOPY WITH ROTATOR CUFF REPAIR AND SUBACROMIAL DECOMPRESSION Right 09/20/2021   Procedure: SHOULDER MINI OPEN ROTATOR CUFF REPAIR AND SUBACROMIAL DECOMPRESSION WITH POSSIBLE PATCH GRAFT;  Surgeon: Jene Every, MD;  Location: [facility];  Service: Orthopedics;  Laterality: Right;  90 MINS   stent  Left 03/2017   anterior descending ; drug eluting ; tx of MI at [facility]    TONSILLECTOMY     VAGINAL HYSTERECTOMY     vaginal sling     Patient Active Problem List   Diagnosis Date Noted   Closed bicondylar fracture of distal femur (HCC) 08/20/2022   Hyponatremia 08/20/2022   Hypomagnesemia 05/13/2022   Pelvis fracture (HCC) 05/12/2022   Humeral head fracture, right, closed, initial encounter  05/12/2022   Elevated troponin 05/12/2022   Hematoma 05/12/2022   Hypertension 11/05/2021   Complete rotator cuff tear 09/20/2021   Closed intertrochanteric fracture of right hip (HCC) 07/11/2019   Closed right hip fracture (HCC) 07/11/2019   Chronic cholecystitis due to cholelithiasis with choledocholithiasis 11/05/2017   Cholelithiasis with chronic cholecystitis 11/01/2017   Cholelithiasis 05/18/2017   CAD in native artery 03/31/2017   Ischemic cardiomyopathy  03/31/2017   Hyperlipemia 03/31/2017   DJD (degenerative joint disease) 03/31/2017   Osteoporosis 03/31/2017    PCP: Renford Dills, MD  REFERRING PROVIDER: Dion Saucier D, PA  REFERRING DIAG: ORIF R Distal Femur FX 08/21/22 ORIF R Acetabulum FX 05/14/22  THERAPY DIAG:  Muscle weakness (generalized)  Pain in right hip  Stiffness of right hip, not elsewhere classified  Difficulty in walking, not elsewhere classified  Cramp and spasm  Other abnormalities of gait and mobility  Rationale for Evaluation and Treatment: Rehabilitation  ONSET DATE: R hip ORIF 08/21/22  SUBJECTIVE:   SUBJECTIVE STATEMENT: Patient states she wasn't able to schedule for a week or so due to moving into [facility].  However, I am walking a lot more and I think this is really helping with the pain.  Patient reports pain at 3/10 upon arrival.  I am walking without any a.d. in my new apartment at [facility].  Still using walker when going to meals.  I am able to reach down and pick things up if I am sitting.      PERTINENT HISTORY: From H&P: 87 yo female with history of CAD, ischemic cardiomyopathy, paroxysmal atrial fibrillation, HTN, HLD, PACs and spinal stenosis 3rd R hip fx, R shoulder fx  PAIN:  04/02/23 Are you having pain? Yes: NPRS scale: 3/10 Pain location: R front thigh and knee Pain description: dull Aggravating factors: standing/walking Relieving factors: rest  PRECAUTIONS: None  WEIGHT BEARING RESTRICTIONS: No  FALLS:  Has patient fallen in last 6 months? Yes. Number of falls 2  LIVING ENVIRONMENT: Lives with: lives alone Daughter lives close; family checks in daily Lives in: House/apartment Stairs: No Has following equipment at home: Dan Humphreys - 4 wheeled, shower chair, bed side commode, and Ramped entry  OCCUPATION: Retired  PATIENT GOALS: "I just want to get stronger and not be susceptible to losing balance"  NEXT MD VISIT: n/a  OBJECTIVE: (Measures in this section  from initial evaluation unless otherwise noted)  DIAGNOSTIC FINDINGS: 08/21/22 hip x-ray demos ORIF  PATIENT SURVEYS:  Eval:  FOTO 40; predicted 53 02/11/2023:  FOTO 59 (goal met)  EDEMA:  Notes a little R posterior thigh swelling  MUSCLE LENGTH: Hamstrings: Right ~70 deg; Left ~70 deg Thomas test: Right -10 deg; Left 10 deg  POSTURE: flexed trunk   LOWER EXTREMITY ROM:  Active/Passive ROM Right eval Left eval Right 01/09/23 Right 03/17/23  Hip flexion 80/100 125/125 100/125 110/128    LOWER EXTREMITY MMT:  MMT Right eval Left eval Right 02/09/23 Right 03/17/23 Right 04/02/23  Hip flexion 3-* 5 3+ within available range  3+ 4-  Hip extension 3- 3+ 3 3+ 4-  Hip abduction 2+ 3- 3 3+ 4-  Hip adduction       Hip internal rotation       Hip external rotation       Knee flexion 4+ 5 5 5 5   Knee extension 3+ 5 4 4 4   Ankle dorsiflexion       Ankle plantarflexion       Ankle inversion  Ankle eversion        (Blank rows = not tested) * = concordant pain  FUNCTIONAL TESTS:  Eval: 5 times sit to stand: 12.95 sec Berg Balance test: 40/56  01/23/23 5 times sit<>stand: 10 sec Berg Balance test: 47/56  02/11/2023: 5 times sit to stand: 10.94 sec without UE Timed Up and Go (TUG):  12.24 sec with 4WRW 3 minute walk test:  481 ft with RPE of 8/10  03/17/2023: 5 times sit to stand: 10.85 sec without UE Timed Up and Go (TUG):  10.53 sec with 4WRW 3 minute walk test:  507.2 ft with RPE of 4/10   04/02/2023: 5 times sit to stand: 10.79 sec without UE Timed Up and Go (TUG):  10.72 sec with 4WRW 3 minute walk test:  338.2 ft with RPE of 4/10 with Cane! GAIT: Distance walked: 100' Assistive device utilized: Environmental consultant - 4 wheeled Level of assistance: Modified independence Comments: Reciprocal gait, flexed trunk   TODAY'S TREATMENT:   DATE: 04/28/23 Nustep x 5 min level 3 (PT present to discuss status) Gait training with SPC Sit to stand with feet side by side Sit to stand  with right foot in back Stepping over hurdle and back  each LE 2 x 10 (1 set with UE support and 1 set with cga) Step ups onto balance pad x 10 each LE with min assist Standing TKE with green band held by PT 2 x 10  Reviewed hamstring and quad/hip flexor stretches 3 x 30 sec and a method for doing these effectively at home.     DATE: 04/09/23 Nustep x 5 min level 3 (PT present to discuss status) Prone right hip flexor and quad stretch, tried new technique off edge of bed with left foot on floor, using strap 3 x 30 sec Alternating cone taps x 20 without UE support with cga from PT Alternating tap ups onto 2" step x 20 without UE support and without PT assist Up and over 2" step side stepping x 20 Seated LAQ 2 x 10 each LE Cone touches with cone on counter in room.  Left foot in back on 2 inch step, right foot fwd 3 x 10 Prone over edge of table hip extension 2 x 10 Side lying hip abduction with PT providing manual blocking to hip flexion and trunk rolling to isolate the right hip abductor 2 x 10  Trigger Point Dry-Needling  Treatment instructions: Expect mild to moderate muscle soreness. S/S of pneumothorax if dry needled over a lung field, and to seek immediate medical attention should they occur. Patient verbalized understanding of these instructions and education. Patient Consent Given: Yes Education handout provided: Yes Muscles treated: right lateral quad Electrical stimulation performed: No Parameters: N/A Treatment response/outcome: Skilled palpation used to identify taut bands and trigger points.  Once identified, dry needling techniques used to treat these areas.  Twitch response ellicited along with palpable elongation of muscle.  Following treatment, patient demonstrates non antalgic gait and reports minimal pain.      DATE: 04/02/23 Nustep L1 x 5 min (legs only) Recert assessment completed Hip matrix with 30 lbs 2 x 10 each direction hip extension and abduction both  LE's Review of walking schedule and use of walker vs cane   PATIENT EDUCATION:  Education details: Exam findings, POC, initial HEP Person educated: Patient Education method: Explanation, Demonstration, and Handouts Education comprehension: verbalized understanding, returned demonstration, and needs further education  HOME EXERCISE PROGRAM: Access Code: [email or web address] URL: [email or web address] Date:  02/19/2023 Prepared by: Mikey Kirschner  Exercises - Figure 4 Bridge (Mirrored)  - 1 x daily - 7 x weekly - 2 sets - 10 reps - Standing Hamstring Curl with Resistance  - 1 x daily - 7 x weekly - 2 sets - 10 reps - Sidelying Bent Knee Hip Flexion  - 1 x daily - 7 x weekly - 2 sets - 10 reps - Standing 'L' Stretch at Counter  - 1 x daily - 7 x weekly - 2 sets - 30 sec hold - Standing Lumbar Extension with Counter  - 1 x daily - 7 x weekly - 1 sets - 5 reps - 5 sec hold - Seated Piriformis Stretch  - 1 x daily - 7 x weekly - 2 sets - 30 sec hold - Seated Piriformis Stretch  - 1 x daily - 7 x weekly - 2 sets - 30 sec hold - Sidelying Hip Abduction  - 1 x daily - 7 x weekly - 2 sets - 10 reps - Prone Hip Extension - Two Pillows  - 1 x daily - 7 x weekly - 2 sets - 10 reps - Seated Straight Leg Raise with Support  - 1 x daily - 7 x weekly - 2 sets - 10 reps - Quadricep Stretch with Chair and Counter Support  - 1 x daily - 7 x weekly - 1 sets - 3 reps - 30 sec hold  ASSESSMENT:  CLINICAL IMPRESSION: Kailyn has completed her move to ALF and is doing really well.  She feels she is walking more due to having to go to meals.  She feels like this is really helping.  She demonstrated much improved tolerance to weight shift today and was able to complete all reps when weight was on the right LE which is something she has really struggled with due to pain.  Her pain seems much more controlled.  She continues to lack full knee extension during stance phase on the right.  We worked on this today  and she will focus more on this on her own.  She saw MD for f/u and he has released her from his care.  We encouraged her to continue here to see how she progresses and we will plan on her continuing with some of the exercise classes on land and in pool at her new home.    She would benefit from continued skilled PT for right hip strengthening and flexibility along with weight shifting.    OBJECTIVE IMPAIRMENTS: Abnormal gait, decreased activity tolerance, decreased balance, decreased endurance, decreased mobility, difficulty walking, decreased ROM, decreased strength, hypomobility, increased fascial restrictions, impaired flexibility, improper body mechanics, postural dysfunction, and pain.   GOALS: Goals reviewed with patient? Yes  SHORT TERM GOALS: Target date: 01/12/2023  Pt will be ind with initial HEP Baseline: Goal status: MET  2.  PT will assess 5x STS and set goals accordingly Baseline:  Goal status: MET  3.  PT will assess Berg Balance and set goals accordingly Baseline:  Goal status: MET  4.  Pt will be able to improve R hip flexion to at least 100 deg in sitting Baseline:  01/23/23: 100 deg AROM after stretching Goal status: MET   LONG TERM GOALS: Target date: 04/03/2023   Pt will be ind with management and progression of HEP Baseline:  Goal status: IN PROGRESS  2.  Pt will have 5x STS of </=10 sec to demo decreased risk of falls and improved functional LE strength  Baseline: 12.96 sec 01/23/23: 10 sec Goal status: IN PROGRESS  3.  Pt will have improved Berg Balance Score by at least 8 points to demo MCID for decreased fall risk Baseline: 40/56 01/23/23: 47/56 Goal status: IN PROGRESS  4.  Pt will be able to perform household amb (at least 400') without a/d  Baseline:  02/09/23: Walking with cane only when someone is at home for supervision; otherwise walking with rollator x10 min twice a day Goal status: IN PROGRESS  5.  Pt will be able to tolerate standing for at  least 20 minutes to be able to cook/wash dishes Baseline:  Goal status: MET 02/09/23  6.  Pt will have improved FOTO to >/=53 Baseline: 02/09/23: 46 Goal status: MET on 02/11/2023   PLAN:  PT FREQUENCY: 1-2x/week  PT DURATION: 8 weeks  PLANNED INTERVENTIONS: Therapeutic exercises, Therapeutic activity, Neuromuscular re-education, Balance training, Gait training, Patient/Family education, Self Care, Joint mobilization, Stair training, Aquatic Therapy, Cryotherapy, Moist heat, Taping, Ionotophoresis 4mg /ml Dexamethasone, Manual therapy, and Re-evaluation  PLAN FOR NEXT SESSION: continue working on TKE and end range quad strength on right as well as hip stability and strength, gait training without a.d. and with cane.    Victorino Dike B. Kortnee Bas, PT 04/28/23 11:06 AM [facility] [address] [place], [place] [address] Phone # [phone] Fax [phone]

## 2023-04-30 ENCOUNTER — Ambulatory Visit: Payer: Medicare Other

## 2023-04-30 DIAGNOSIS — R2689 Other abnormalities of gait and mobility: Secondary | ICD-10-CM | POA: Diagnosis not present

## 2023-04-30 DIAGNOSIS — M25651 Stiffness of right hip, not elsewhere classified: Secondary | ICD-10-CM

## 2023-04-30 DIAGNOSIS — R252 Cramp and spasm: Secondary | ICD-10-CM | POA: Diagnosis not present

## 2023-04-30 DIAGNOSIS — M6281 Muscle weakness (generalized): Secondary | ICD-10-CM

## 2023-04-30 DIAGNOSIS — R262 Difficulty in walking, not elsewhere classified: Secondary | ICD-10-CM | POA: Diagnosis not present

## 2023-04-30 DIAGNOSIS — M25551 Pain in right hip: Secondary | ICD-10-CM | POA: Diagnosis not present

## 2023-04-30 DIAGNOSIS — R293 Abnormal posture: Secondary | ICD-10-CM | POA: Diagnosis not present

## 2023-04-30 NOTE — Therapy (Signed)
OUTPATIENT PHYSICAL THERAPY TREATMENT   Patient Name: Carrie Barnes MRN: [id number] DOB:[birth year], 87 y.o., female Today's Date: 04/30/2023  END OF SESSION:  PT End of Session - 04/30/23 1159     Visit Number 28    Number of Visits 16    Date for PT Re-Evaluation 05/28/23    Authorization Type BCBS Medicare    Progress Note Due on Visit 30    PT Start Time 1143    PT Stop Time 1228    PT Time Calculation (min) 45 min    Activity Tolerance Patient tolerated treatment well    Behavior During Therapy [facility] for tasks assessed/performed                  Past Medical History:  Diagnosis Date   Cholecystitis    Coronary artery disease    a. 03/2017: 80-90% LAD stenosis (PCI/DES placement with a 2.75x16 mm Promus Premier stent). No significant stenosis along RCA or LCx.    DJD (degenerative joint disease)    GERD (gastroesophageal reflux disease)    Hyperlipidemia    Hypertension    dx s/p MI    Ischemic cardiomyopathy    Myocardial infarction (HCC) 03/17/2017   03-2017 treated at [facility]    Osteoporosis    Status post insertion of drug-eluting stent into left anterior descending (LAD) artery 03/2017   Past Surgical History:  Procedure Laterality Date   APPENDECTOMY     CESAREAN SECTION     CHOLECYSTECTOMY N/A 11/05/2017   Procedure: LAPAROSCOPIC CHOLECYSTECTOMY WITH INTRAOPERATIVE CHOLANGIOGRAM;  Surgeon: Darnell Level, MD;  Location: [facility];  Service: General;  Laterality: N/A;   ERCP N/A 11/11/2017   Procedure: ENDOSCOPIC RETROGRADE CHOLANGIOPANCREATOGRAPHY (ERCP);  Surgeon: Willis Modena, MD;  Location: [facility];  Service: Endoscopy;  Laterality: N/A;  possible   EUS N/A 11/11/2017   Procedure: UPPER ENDOSCOPIC ULTRASOUND (EUS) RADIAL;  Surgeon: Willis Modena, MD;  Location: [facility];  Service: Endoscopy;  Laterality: N/A;   FEMUR IM NAIL Right 07/13/2019   Procedure: INTRAMEDULLARY (IM) RETROGRADE FEMORAL NAILING;  Surgeon:  Ollen Gross, MD;  Location: [facility];  Service: Orthopedics;  Laterality: Right;    LUMBAR LAMINECTOMY/ DECOMPRESSION WITH MET-RX     OPEN REDUCTION INTERNAL FIXATION ACETABULAR FRACTURE STOPPA Right 05/14/2022   Procedure: OPEN REDUCTION INTERNAL FIXATION RIGHT ACETABULUM;  Surgeon: Roby Lofts, MD;  Location: [facility] OR;  Service: Orthopedics;  Laterality: Right;   ORIF FEMUR FRACTURE Right 08/21/2022   Procedure: RIGHT OPEN REDUCTION INTERNAL FIXATION (ORIF) DISTAL FEMUR FRACTURE;  Surgeon: Roby Lofts, MD;  Location: [facility] OR;  Service: Orthopedics;  Laterality: Right;   REVERSE SHOULDER ARTHROPLASTY Right 05/15/2022   Procedure: RIGHT REVERSE SHOULDER ARTHROPLASTY;  Surgeon: Yolonda Kida, MD;  Location: [facility] OR;  Service: Orthopedics;  Laterality: Right;   SHOULDER ARTHROSCOPY WITH ROTATOR CUFF REPAIR AND SUBACROMIAL DECOMPRESSION Right 09/20/2021   Procedure: SHOULDER MINI OPEN ROTATOR CUFF REPAIR AND SUBACROMIAL DECOMPRESSION WITH POSSIBLE PATCH GRAFT;  Surgeon: Jene Every, MD;  Location: [facility];  Service: Orthopedics;  Laterality: Right;  90 MINS   stent  Left 03/2017   anterior descending ; drug eluting ; tx of MI at [facility]    TONSILLECTOMY     VAGINAL HYSTERECTOMY     vaginal sling     Patient Active Problem List   Diagnosis Date Noted   Closed bicondylar fracture of distal femur (HCC) 08/20/2022   Hyponatremia 08/20/2022   Hypomagnesemia  05/13/2022   Pelvis fracture (HCC) 05/12/2022   Humeral head fracture, right, closed, initial encounter 05/12/2022   Elevated troponin 05/12/2022   Hematoma 05/12/2022   Hypertension 11/05/2021   Complete rotator cuff tear 09/20/2021   Closed intertrochanteric fracture of right hip (HCC) 07/11/2019   Closed right hip fracture (HCC) 07/11/2019   Chronic cholecystitis due to cholelithiasis with choledocholithiasis 11/05/2017   Cholelithiasis with chronic cholecystitis 11/01/2017   Cholelithiasis 05/18/2017    CAD in native artery 03/31/2017   Ischemic cardiomyopathy 03/31/2017   Hyperlipemia 03/31/2017   DJD (degenerative joint disease) 03/31/2017   Osteoporosis 03/31/2017    PCP: Renford Dills, MD  REFERRING PROVIDER: Dion Saucier D, PA  REFERRING DIAG: ORIF R Distal Femur FX 08/21/22 ORIF R Acetabulum FX 05/14/22  THERAPY DIAG:  Muscle weakness (generalized)  Pain in right hip  Stiffness of right hip, not elsewhere classified  Difficulty in walking, not elsewhere classified  Cramp and spasm  Rationale for Evaluation and Treatment: Rehabilitation  ONSET DATE: R hip ORIF 08/21/22  SUBJECTIVE:   SUBJECTIVE STATEMENT: Patient states she is continuing to increase her walking distance with her cane but admits, she still feels more stable with the walker.  "I was a little sore after last visit".     PERTINENT HISTORY: From H&P: 87 yo female with history of CAD, ischemic cardiomyopathy, paroxysmal atrial fibrillation, HTN, HLD, PACs and spinal stenosis 3rd R hip fx, R shoulder fx  PAIN:  04/02/23 Are you having pain? Yes: NPRS scale: 3/10 Pain location: R front thigh and knee Pain description: dull Aggravating factors: standing/walking Relieving factors: rest  PRECAUTIONS: None  WEIGHT BEARING RESTRICTIONS: No  FALLS:  Has patient fallen in last 6 months? Yes. Number of falls 2  LIVING ENVIRONMENT: Lives with: lives alone Daughter lives close; family checks in daily Lives in: House/apartment Stairs: No Has following equipment at home: Dan Humphreys - 4 wheeled, shower chair, bed side commode, and Ramped entry  OCCUPATION: Retired  PATIENT GOALS: "I just want to get stronger and not be susceptible to losing balance"  NEXT MD VISIT: n/a  OBJECTIVE: (Measures in this section from initial evaluation unless otherwise noted)  DIAGNOSTIC FINDINGS: 08/21/22 hip x-ray demos ORIF  PATIENT SURVEYS:  Eval:  FOTO 40; predicted 53 02/11/2023:  FOTO 59 (goal met)  EDEMA:  Notes a  little R posterior thigh swelling  MUSCLE LENGTH: Hamstrings: Right ~70 deg; Left ~70 deg Thomas test: Right -10 deg; Left 10 deg  POSTURE: flexed trunk   LOWER EXTREMITY ROM:  Active/Passive ROM Right eval Left eval Right 01/09/23 Right 03/17/23  Hip flexion 80/100 125/125 100/125 110/128    LOWER EXTREMITY MMT:  MMT Right eval Left eval Right 02/09/23 Right 03/17/23 Right 04/02/23  Hip flexion 3-* 5 3+ within available range  3+ 4-  Hip extension 3- 3+ 3 3+ 4-  Hip abduction 2+ 3- 3 3+ 4-  Hip adduction       Hip internal rotation       Hip external rotation       Knee flexion 4+ 5 5 5 5   Knee extension 3+ 5 4 4 4   Ankle dorsiflexion       Ankle plantarflexion       Ankle inversion       Ankle eversion        (Blank rows = not tested) * = concordant pain  FUNCTIONAL TESTS:  Eval: 5 times sit to stand: 12.95 sec Berg Balance test: 40/56  01/23/23  5 times sit<>stand: 10 sec Berg Balance test: 47/56  02/11/2023: 5 times sit to stand: 10.94 sec without UE Timed Up and Go (TUG):  12.24 sec with 4WRW 3 minute walk test:  481 ft with RPE of 8/10  03/17/2023: 5 times sit to stand: 10.85 sec without UE Timed Up and Go (TUG):  10.53 sec with 4WRW 3 minute walk test:  507.2 ft with RPE of 4/10   04/02/2023: 5 times sit to stand: 10.79 sec without UE Timed Up and Go (TUG):  10.72 sec with 4WRW 3 minute walk test:  338.2 ft with RPE of 4/10 with Cane! GAIT: Distance walked: 100' Assistive device utilized: Environmental consultant - 4 wheeled Level of assistance: Modified independence Comments: Reciprocal gait, flexed trunk   TODAY'S TREATMENT:   DATE: 04/30/23 Nustep x 5 min level 3 (PT present to discuss status) Treadmill gait training: working on heel to toe progression (with increasing speed, both hands, then single hand, right then left) total time approx 10 min.  Gait training with SPC x 150 feet  Standing hip abduction and extension 2 x 10 in // bars Lateral band walks with red  band in // bars 4 laps Squats in // bars x 10 Step up and hold fwd then lateral x 10 each onto balance pad in // bars (single UE support)   DATE: 04/28/23 Nustep x 5 min level 3 (PT present to discuss status) Gait training with SPC Sit to stand with feet side by side Sit to stand with right foot in back Stepping over hurdle and back  each LE 2 x 10 (1 set with UE support and 1 set with cga) Step ups onto balance pad x 10 each LE with min assist Standing TKE with green band held by PT 2 x 10  Reviewed hamstring and quad/hip flexor stretches 3 x 30 sec and a method for doing these effectively at home.     DATE: 04/09/23 Nustep x 5 min level 3 (PT present to discuss status) Prone right hip flexor and quad stretch, tried new technique off edge of bed with left foot on floor, using strap 3 x 30 sec Alternating cone taps x 20 without UE support with cga from PT Alternating tap ups onto 2" step x 20 without UE support and without PT assist Up and over 2" step side stepping x 20 Seated LAQ 2 x 10 each LE Cone touches with cone on counter in room.  Left foot in back on 2 inch step, right foot fwd 3 x 10 Prone over edge of table hip extension 2 x 10 Side lying hip abduction with PT providing manual blocking to hip flexion and trunk rolling to isolate the right hip abductor 2 x 10  Trigger Point Dry-Needling  Treatment instructions: Expect mild to moderate muscle soreness. S/S of pneumothorax if dry needled over a lung field, and to seek immediate medical attention should they occur. Patient verbalized understanding of these instructions and education. Patient Consent Given: Yes Education handout provided: Yes Muscles treated: right lateral quad Electrical stimulation performed: No Parameters: N/A Treatment response/outcome: Skilled palpation used to identify taut bands and trigger points.  Once identified, dry needling techniques used to treat these areas.  Twitch response ellicited along with  palpable elongation of muscle.  Following treatment, patient demonstrates non antalgic gait and reports minimal pain.       PATIENT EDUCATION:  Education details: Exam findings, POC, initial HEP Person educated: Patient Education method: Explanation, Demonstration, and  Handouts Education comprehension: verbalized understanding, returned demonstration, and needs further education  HOME EXERCISE PROGRAM: Access Code: [email or web address] URL: [email or web address] Date: 02/19/2023 Prepared by: Mikey Kirschner  Exercises - Figure 4 Bridge (Mirrored)  - 1 x daily - 7 x weekly - 2 sets - 10 reps - Standing Hamstring Curl with Resistance  - 1 x daily - 7 x weekly - 2 sets - 10 reps - Sidelying Bent Knee Hip Flexion  - 1 x daily - 7 x weekly - 2 sets - 10 reps - Standing 'L' Stretch at Counter  - 1 x daily - 7 x weekly - 2 sets - 30 sec hold - Standing Lumbar Extension with Counter  - 1 x daily - 7 x weekly - 1 sets - 5 reps - 5 sec hold - Seated Piriformis Stretch  - 1 x daily - 7 x weekly - 2 sets - 30 sec hold - Seated Piriformis Stretch  - 1 x daily - 7 x weekly - 2 sets - 30 sec hold - Sidelying Hip Abduction  - 1 x daily - 7 x weekly - 2 sets - 10 reps - Prone Hip Extension - Two Pillows  - 1 x daily - 7 x weekly - 2 sets - 10 reps - Seated Straight Leg Raise with Support  - 1 x daily - 7 x weekly - 2 sets - 10 reps - Quadricep Stretch with Chair and Counter Support  - 1 x daily - 7 x weekly - 1 sets - 3 reps - 30 sec hold  ASSESSMENT:  CLINICAL IMPRESSION: Shanlee is making continued progress since moving to ALF.  She is working on walking with her cane in the hallway outside of her room.  She is increasing this distance with each attempt.  She was able to ambulate 150 feet with SPC today without rest break.  She requires mod verbal cues for upright posture and knee extension on heel strike. Glut medius weakness still apparent with fatigue in that her trendelenburg gait becomes more  apparent. She was able to demonstrate improved step length on treadmill work today with verbal cues. She denied any significant increase in pain.   She would benefit from continued skilled PT for right hip strengthening and flexibility along with weight shifting.    OBJECTIVE IMPAIRMENTS: Abnormal gait, decreased activity tolerance, decreased balance, decreased endurance, decreased mobility, difficulty walking, decreased ROM, decreased strength, hypomobility, increased fascial restrictions, impaired flexibility, improper body mechanics, postural dysfunction, and pain.   GOALS: Goals reviewed with patient? Yes  SHORT TERM GOALS: Target date: 01/12/2023  Pt will be ind with initial HEP Baseline: Goal status: MET  2.  PT will assess 5x STS and set goals accordingly Baseline:  Goal status: MET  3.  PT will assess Berg Balance and set goals accordingly Baseline:  Goal status: MET  4.  Pt will be able to improve R hip flexion to at least 100 deg in sitting Baseline:  01/23/23: 100 deg AROM after stretching Goal status: MET   LONG TERM GOALS: Target date: 04/03/2023   Pt will be ind with management and progression of HEP Baseline:  Goal status: IN PROGRESS  2.  Pt will have 5x STS of </=10 sec to demo decreased risk of falls and improved functional LE strength Baseline: 12.96 sec 01/23/23: 10 sec Goal status: IN PROGRESS  3.  Pt will have improved Berg Balance Score by at least 8 points to demo MCID for decreased fall risk Baseline: 40/56  01/23/23: 47/56 Goal status: IN PROGRESS  4.  Pt will be able to perform household amb (at least 400') without a/d  Baseline:  02/09/23: Walking with cane only when someone is at home for supervision; otherwise walking with rollator x10 min twice a day Goal status: IN PROGRESS  5.  Pt will be able to tolerate standing for at least 20 minutes to be able to cook/wash dishes Baseline:  Goal status: MET 02/09/23  6.  Pt will have improved FOTO to  >/=53 Baseline: 02/09/23: 46 Goal status: MET on 02/11/2023   PLAN:  PT FREQUENCY: 1-2x/week  PT DURATION: 8 weeks  PLANNED INTERVENTIONS: Therapeutic exercises, Therapeutic activity, Neuromuscular re-education, Balance training, Gait training, Patient/Family education, Self Care, Joint mobilization, Stair training, Aquatic Therapy, Cryotherapy, Moist heat, Taping, Ionotophoresis 4mg /ml Dexamethasone, Manual therapy, and Re-evaluation  PLAN FOR NEXT SESSION: continue working on TKE and end range quad strength on right as well as hip stability and strength, gait training without a.d. and with cane.    Victorino Dike B. Toniyah Dilmore, PT 04/30/23 12:38 PM  [facility] [address] [place], [place] [address] Phone # [phone] Fax [phone]

## 2023-05-05 ENCOUNTER — Ambulatory Visit: Payer: Medicare Other

## 2023-05-05 DIAGNOSIS — R252 Cramp and spasm: Secondary | ICD-10-CM | POA: Diagnosis not present

## 2023-05-05 DIAGNOSIS — M25651 Stiffness of right hip, not elsewhere classified: Secondary | ICD-10-CM | POA: Diagnosis not present

## 2023-05-05 DIAGNOSIS — R262 Difficulty in walking, not elsewhere classified: Secondary | ICD-10-CM | POA: Diagnosis not present

## 2023-05-05 DIAGNOSIS — R293 Abnormal posture: Secondary | ICD-10-CM | POA: Diagnosis not present

## 2023-05-05 DIAGNOSIS — R2689 Other abnormalities of gait and mobility: Secondary | ICD-10-CM | POA: Diagnosis not present

## 2023-05-05 DIAGNOSIS — M6281 Muscle weakness (generalized): Secondary | ICD-10-CM | POA: Diagnosis not present

## 2023-05-05 DIAGNOSIS — M25551 Pain in right hip: Secondary | ICD-10-CM | POA: Diagnosis not present

## 2023-05-05 NOTE — Therapy (Signed)
OUTPATIENT PHYSICAL THERAPY TREATMENT   Patient Name: Carrie Barnes MRN: [id number] DOB:Apr 22, 1936, 87 y.o., female Today's Date: 05/05/2023  END OF SESSION:  PT End of Session - 05/05/23 1106     Visit Number 29    Date for PT Re-Evaluation 05/28/23    Authorization Type BCBS Medicare    Progress Note Due on Visit 30    PT Start Time 1102    PT Stop Time 1144    PT Time Calculation (min) 42 min    Activity Tolerance Patient tolerated treatment well    Behavior During Therapy [facility] for tasks assessed/performed                  Past Medical History:  Diagnosis Date   Cholecystitis    Coronary artery disease    a. 03/2017: 80-90% LAD stenosis (PCI/DES placement with a 2.75x16 mm Promus Premier stent). No significant stenosis along RCA or LCx.    DJD (degenerative joint disease)    GERD (gastroesophageal reflux disease)    Hyperlipidemia    Hypertension    dx s/p MI    Ischemic cardiomyopathy    Myocardial infarction (HCC) 03/17/2017   03-2017 treated at [facility]    Osteoporosis    Status post insertion of drug-eluting stent into left anterior descending (LAD) artery 03/2017   Past Surgical History:  Procedure Laterality Date   APPENDECTOMY     CESAREAN SECTION     CHOLECYSTECTOMY N/A 11/05/2017   Procedure: LAPAROSCOPIC CHOLECYSTECTOMY WITH INTRAOPERATIVE CHOLANGIOGRAM;  Surgeon: Darnell Level, MD;  Location: [facility];  Service: General;  Laterality: N/A;   ERCP N/A 11/11/2017   Procedure: ENDOSCOPIC RETROGRADE CHOLANGIOPANCREATOGRAPHY (ERCP);  Surgeon: Willis Modena, MD;  Location: [facility];  Service: Endoscopy;  Laterality: N/A;  possible   EUS N/A 11/11/2017   Procedure: UPPER ENDOSCOPIC ULTRASOUND (EUS) RADIAL;  Surgeon: Willis Modena, MD;  Location: [facility];  Service: Endoscopy;  Laterality: N/A;   FEMUR IM NAIL Right 07/13/2019   Procedure: INTRAMEDULLARY (IM) RETROGRADE FEMORAL NAILING;  Surgeon: Ollen Gross, MD;  Location:  [facility];  Service: Orthopedics;  Laterality: Right;    LUMBAR LAMINECTOMY/ DECOMPRESSION WITH MET-RX     OPEN REDUCTION INTERNAL FIXATION ACETABULAR FRACTURE STOPPA Right 05/14/2022   Procedure: OPEN REDUCTION INTERNAL FIXATION RIGHT ACETABULUM;  Surgeon: Roby Lofts, MD;  Location: [facility] OR;  Service: Orthopedics;  Laterality: Right;   ORIF FEMUR FRACTURE Right 08/21/2022   Procedure: RIGHT OPEN REDUCTION INTERNAL FIXATION (ORIF) DISTAL FEMUR FRACTURE;  Surgeon: Roby Lofts, MD;  Location: [facility] OR;  Service: Orthopedics;  Laterality: Right;   REVERSE SHOULDER ARTHROPLASTY Right 05/15/2022   Procedure: RIGHT REVERSE SHOULDER ARTHROPLASTY;  Surgeon: Yolonda Kida, MD;  Location: [facility];  Service: Orthopedics;  Laterality: Right;   SHOULDER ARTHROSCOPY WITH ROTATOR CUFF REPAIR AND SUBACROMIAL DECOMPRESSION Right 09/20/2021   Procedure: SHOULDER MINI OPEN ROTATOR CUFF REPAIR AND SUBACROMIAL DECOMPRESSION WITH POSSIBLE PATCH GRAFT;  Surgeon: Jene Every, MD;  Location: [facility];  Service: Orthopedics;  Laterality: Right;  90 MINS   stent  Left 03/2017   anterior descending ; drug eluting ; tx of MI at [facility]    TONSILLECTOMY     VAGINAL HYSTERECTOMY     vaginal sling     Patient Active Problem List   Diagnosis Date Noted   Closed bicondylar fracture of distal femur (HCC) 08/20/2022   Hyponatremia 08/20/2022   Hypomagnesemia 05/13/2022   Pelvis fracture (HCC) 05/12/2022  Humeral head fracture, right, closed, initial encounter 05/12/2022   Elevated troponin 05/12/2022   Hematoma 05/12/2022   Hypertension 11/05/2021   Complete rotator cuff tear 09/20/2021   Closed intertrochanteric fracture of right hip (HCC) 07/11/2019   Closed right hip fracture (HCC) 07/11/2019   Chronic cholecystitis due to cholelithiasis with choledocholithiasis 11/05/2017   Cholelithiasis with chronic cholecystitis 11/01/2017   Cholelithiasis 05/18/2017   CAD in native artery 03/31/2017    Ischemic cardiomyopathy 03/31/2017   Hyperlipemia 03/31/2017   DJD (degenerative joint disease) 03/31/2017   Osteoporosis 03/31/2017    PCP: Renford Dills, MD  REFERRING PROVIDER: Dion Saucier D, PA  REFERRING DIAG: ORIF R Distal Femur FX 08/21/22 ORIF R Acetabulum FX 05/14/22  THERAPY DIAG:  Muscle weakness (generalized)  Pain in right hip  Stiffness of right hip, not elsewhere classified  Difficulty in walking, not elsewhere classified  Cramp and spasm  Rationale for Evaluation and Treatment: Rehabilitation  ONSET DATE: R hip ORIF 08/21/22  SUBJECTIVE:   SUBJECTIVE STATEMENT: Patient states her pain is 4/10 today.  "Some days I don't feel it at all and some days it's more bothersome"  Patient states she is walking to dining room using cane now.  "I kind of put my hand along the handrail as I walk for security".    PERTINENT HISTORY: From H&P: 87 yo female with history of CAD, ischemic cardiomyopathy, paroxysmal atrial fibrillation, HTN, HLD, PACs and spinal stenosis 3rd R hip fx, R shoulder fx  PAIN:  04/02/23 Are you having pain? Yes: NPRS scale: 3/10 Pain location: R front thigh and knee Pain description: dull Aggravating factors: standing/walking Relieving factors: rest  PRECAUTIONS: None  WEIGHT BEARING RESTRICTIONS: No  FALLS:  Has patient fallen in last 6 months? Yes. Number of falls 2  LIVING ENVIRONMENT: Lives with: lives alone Daughter lives close; family checks in daily Lives in: House/apartment Stairs: No Has following equipment at home: Dan Humphreys - 4 wheeled, shower chair, bed side commode, and Ramped entry  OCCUPATION: Retired  PATIENT GOALS: "I just want to get stronger and not be susceptible to losing balance"  NEXT MD VISIT: n/a  OBJECTIVE: (Measures in this section from initial evaluation unless otherwise noted)  DIAGNOSTIC FINDINGS: 08/21/22 hip x-ray [facility]os ORIF  PATIENT SURVEYS:  Eval:  FOTO 40; predicted 53 02/11/2023:  FOTO 59  (goal met)  EDEMA:  Notes a little R posterior thigh swelling  MUSCLE LENGTH: Hamstrings: Right ~70 deg; Left ~70 deg Thomas test: Right -10 deg; Left 10 deg  POSTURE: flexed trunk   LOWER EXTREMITY ROM:  Active/Passive ROM Right eval Left eval Right 01/09/23 Right 03/17/23  Hip flexion 80/100 125/125 100/125 110/128    LOWER EXTREMITY MMT:  MMT Right eval Left eval Right 02/09/23 Right 03/17/23 Right 04/02/23  Hip flexion 3-* 5 3+ within available range  3+ 4-  Hip extension 3- 3+ 3 3+ 4-  Hip abduction 2+ 3- 3 3+ 4-  Hip adduction       Hip internal rotation       Hip external rotation       Knee flexion 4+ 5 5 5 5   Knee extension 3+ 5 4 4 4   Ankle dorsiflexion       Ankle plantarflexion       Ankle inversion       Ankle eversion        (Blank rows = not tested) * = concordant pain  FUNCTIONAL TESTS:  Eval: 5 times sit to stand: 12.95  sec Berg Balance test: 40/56  01/23/23 5 times sit<>stand: 10 sec Berg Balance test: 47/56  02/11/2023: 5 times sit to stand: 10.94 sec without UE Timed Up and Go (TUG):  12.24 sec with 4WRW 3 minute walk test:  481 ft with RPE of 8/10  03/17/2023: 5 times sit to stand: 10.85 sec without UE Timed Up and Go (TUG):  10.53 sec with 4WRW 3 minute walk test:  507.2 ft with RPE of 4/10   04/02/2023: 5 times sit to stand: 10.79 sec without UE Timed Up and Go (TUG):  10.72 sec with 4WRW 3 minute walk test:  338.2 ft with RPE of 4/10 with Cane! GAIT: Distance walked: 100' Assistive device utilized: Environmental consultant - 4 wheeled Level of assistance: Modified independence Comments: Reciprocal gait, flexed trunk   TODAY'S TREATMENT:   DATE: 05/05/23 Nustep x 5 min level 3 (PT present to discuss status) Side lying hip abduction x 20 each side with therapist holding hips fwd Hooklying clam with yellow loop x 20 Side lying clam x 20 each side with yellow loop Hook lying bridge with clam with yellow loop 2 x 10 Prone hip extension 2 x  10 Standing quad/hip flexor stretch at steps with chair behind 3 x 30 Lateral band walks at barre x 4 laps with emphasis on keeping feet wide for consistent concentric/eccentric work (blue band) Sit to stand 2 x 5 from chair with emphasis on hips back to reduce shearing force at PF joint.   DATE: 04/30/23 Nustep x 5 min level 3 (PT present to discuss status) Treadmill gait training: working on heel to toe progression (with increasing speed, both hands, then single hand, right then left) total time approx 10 min.  Gait training with SPC x 150 feet  Standing hip abduction and extension 2 x 10 in // bars Lateral band walks with red band in // bars 4 laps Squats in // bars x 10 Step up and hold fwd then lateral x 10 each onto balance pad in // bars (single UE support)   DATE: 04/28/23 Nustep x 5 min level 3 (PT present to discuss status) Gait training with SPC Sit to stand with feet side by side Sit to stand with right foot in back Stepping over hurdle and back  each LE 2 x 10 (1 set with UE support and 1 set with cga) Step ups onto balance pad x 10 each LE with min assist Standing TKE with green band held by PT 2 x 10  Reviewed hamstring and quad/hip flexor stretches 3 x 30 sec and a method for doing these effectively at home.     PATIENT EDUCATION:  Education details: Exam findings, POC, initial HEP Person educated: Patient Education method: Explanation, Demonstration, and Handouts Education comprehension: verbalized understanding, returned demonstration, and needs further education  HOME EXERCISE PROGRAM: Access Code: [email or web address] URL: [email or web address] Date: 02/19/2023 Prepared by: Mikey Kirschner  Exercises - Figure 4 Bridge (Mirrored)  - 1 x daily - 7 x weekly - 2 sets - 10 reps - Standing Hamstring Curl with Resistance  - 1 x daily - 7 x weekly - 2 sets - 10 reps - Sidelying Bent Knee Hip Flexion  - 1 x daily - 7 x weekly - 2 sets - 10 reps - Standing 'L'  Stretch at Counter  - 1 x daily - 7 x weekly - 2 sets - 30 sec hold - Standing Lumbar Extension with Counter  - 1 x daily - 7 x weekly -  1 sets - 5 reps - 5 sec hold - Seated Piriformis Stretch  - 1 x daily - 7 x weekly - 2 sets - 30 sec hold - Seated Piriformis Stretch  - 1 x daily - 7 x weekly - 2 sets - 30 sec hold - Sidelying Hip Abduction  - 1 x daily - 7 x weekly - 2 sets - 10 reps - Prone Hip Extension - Two Pillows  - 1 x daily - 7 x weekly - 2 sets - 10 reps - Seated Straight Leg Raise with Support  - 1 x daily - 7 x weekly - 2 sets - 10 reps - Quadricep Stretch with Chair and Counter Support  - 1 x daily - 7 x weekly - 1 sets - 3 reps - 30 sec hold  ASSESSMENT:  CLINICAL IMPRESSION: Akeera demonstrated several improvements today.  She was able to ambulate without an a.d. with no trendelenburg drop.  She was also able to keep  She still takes smaller step length without walker but is also using her cane more at the ALF.  She is now walking to/from meals with cane vs walker.  She had some right knee pain with sit to stand but completed 2 sets of 5.   She would benefit from continued skilled PT for right hip strengthening and flexibility along with weight shifting.    OBJECTIVE IMPAIRMENTS: Abnormal gait, decreased activity tolerance, decreased balance, decreased endurance, decreased mobility, difficulty walking, decreased ROM, decreased strength, hypomobility, increased fascial restrictions, impaired flexibility, improper body mechanics, postural dysfunction, and pain.   GOALS: Goals reviewed with patient? Yes  SHORT TERM GOALS: Target date: 01/12/2023  Pt will be ind with initial HEP Baseline: Goal status: MET  2.  PT will assess 5x STS and set goals accordingly Baseline:  Goal status: MET  3.  PT will assess Berg Balance and set goals accordingly Baseline:  Goal status: MET  4.  Pt will be able to improve R hip flexion to at least 100 deg in sitting Baseline:  01/23/23: 100  deg AROM after stretching Goal status: MET   LONG TERM GOALS: Target date: 04/03/2023   Pt will be ind with management and progression of HEP Baseline:  Goal status: IN PROGRESS  2.  Pt will have 5x STS of </=10 sec to demo decreased risk of falls and improved functional LE strength Baseline: 12.96 sec 01/23/23: 10 sec Goal status: IN PROGRESS  3.  Pt will have improved Berg Balance Score by at least 8 points to demo MCID for decreased fall risk Baseline: 40/56 01/23/23: 47/56 Goal status: IN PROGRESS  4.  Pt will be able to perform household amb (at least 400') without a/d  Baseline:  02/09/23: Walking with cane only when someone is at home for supervision; otherwise walking with rollator x10 min twice a day Goal status: IN PROGRESS  5.  Pt will be able to tolerate standing for at least 20 minutes to be able to cook/wash dishes Baseline:  Goal status: MET 02/09/23  6.  Pt will have improved FOTO to >/=53 Baseline: 02/09/23: 46 Goal status: MET on 02/11/2023   PLAN:  PT FREQUENCY: 1-2x/week  PT DURATION: 8 weeks  PLANNED INTERVENTIONS: Therapeutic exercises, Therapeutic activity, Neuromuscular re-education, Balance training, Gait training, Patient/Family education, Self Care, Joint mobilization, Stair training, Aquatic Therapy, Cryotherapy, Moist heat, Taping, Ionotophoresis 4mg /ml Dexamethasone, Manual therapy, and Re-evaluation  PLAN FOR NEXT SESSION: continue working on hip strength, TKE and end range quad  strength on right as well as hip stability, gait training without a.d. and with cane.    Victorino Dike B. Susy Placzek, PT 05/05/23 11:44 AM [facility] [address] [place], [place] [address] Phone # [phone] Fax [phone]

## 2023-05-07 ENCOUNTER — Ambulatory Visit: Payer: Medicare Other

## 2023-05-07 DIAGNOSIS — R262 Difficulty in walking, not elsewhere classified: Secondary | ICD-10-CM | POA: Diagnosis not present

## 2023-05-07 DIAGNOSIS — R293 Abnormal posture: Secondary | ICD-10-CM | POA: Diagnosis not present

## 2023-05-07 DIAGNOSIS — M25651 Stiffness of right hip, not elsewhere classified: Secondary | ICD-10-CM | POA: Diagnosis not present

## 2023-05-07 DIAGNOSIS — R2689 Other abnormalities of gait and mobility: Secondary | ICD-10-CM | POA: Diagnosis not present

## 2023-05-07 DIAGNOSIS — M25551 Pain in right hip: Secondary | ICD-10-CM

## 2023-05-07 DIAGNOSIS — R252 Cramp and spasm: Secondary | ICD-10-CM

## 2023-05-07 DIAGNOSIS — M6281 Muscle weakness (generalized): Secondary | ICD-10-CM

## 2023-05-07 NOTE — Therapy (Signed)
OUTPATIENT PHYSICAL THERAPY TREATMENT   Patient Name: Carrie Barnes MRN: [id number] DOB:[birth year], 87 y.o., female Today's Date: 05/08/2023  END OF SESSION:  PT End of Session - 05/07/23 1414     Visit Number 30    Date for PT Re-Evaluation 05/28/23    Authorization Type BCBS Medicare    Progress Note Due on Visit 40    PT Start Time 1411    PT Stop Time 1449    PT Time Calculation (min) 38 min    Activity Tolerance Patient tolerated treatment well    Behavior During Therapy [facility] for tasks assessed/performed                  Past Medical History:  Diagnosis Date   Cholecystitis    Coronary artery disease    a. 03/2017: 80-90% LAD stenosis (PCI/DES placement with a 2.75x16 mm Promus Premier stent). No significant stenosis along RCA or LCx.    DJD (degenerative joint disease)    GERD (gastroesophageal reflux disease)    Hyperlipidemia    Hypertension    dx s/p MI    Ischemic cardiomyopathy    Myocardial infarction (HCC) 03/17/2017   03-2017 treated at [facility]    Osteoporosis    Status post insertion of drug-eluting stent into left anterior descending (LAD) artery 03/2017   Past Surgical History:  Procedure Laterality Date   APPENDECTOMY     CESAREAN SECTION     CHOLECYSTECTOMY N/A 11/05/2017   Procedure: LAPAROSCOPIC CHOLECYSTECTOMY WITH INTRAOPERATIVE CHOLANGIOGRAM;  Surgeon: Darnell Level, MD;  Location: [facility];  Service: General;  Laterality: N/A;   ERCP N/A 11/11/2017   Procedure: ENDOSCOPIC RETROGRADE CHOLANGIOPANCREATOGRAPHY (ERCP);  Surgeon: Willis Modena, MD;  Location: [facility];  Service: Endoscopy;  Laterality: N/A;  possible   EUS N/A 11/11/2017   Procedure: UPPER ENDOSCOPIC ULTRASOUND (EUS) RADIAL;  Surgeon: Willis Modena, MD;  Location: [facility];  Service: Endoscopy;  Laterality: N/A;   FEMUR IM NAIL Right 07/13/2019   Procedure: INTRAMEDULLARY (IM) RETROGRADE FEMORAL NAILING;  Surgeon: Ollen Gross, MD;  Location:  [facility];  Service: Orthopedics;  Laterality: Right;    LUMBAR LAMINECTOMY/ DECOMPRESSION WITH MET-RX     OPEN REDUCTION INTERNAL FIXATION ACETABULAR FRACTURE STOPPA Right 05/14/2022   Procedure: OPEN REDUCTION INTERNAL FIXATION RIGHT ACETABULUM;  Surgeon: Roby Lofts, MD;  Location: [facility] OR;  Service: Orthopedics;  Laterality: Right;   ORIF FEMUR FRACTURE Right 08/21/2022   Procedure: RIGHT OPEN REDUCTION INTERNAL FIXATION (ORIF) DISTAL FEMUR FRACTURE;  Surgeon: Roby Lofts, MD;  Location: [facility] OR;  Service: Orthopedics;  Laterality: Right;   REVERSE SHOULDER ARTHROPLASTY Right 05/15/2022   Procedure: RIGHT REVERSE SHOULDER ARTHROPLASTY;  Surgeon: Yolonda Kida, MD;  Location: [facility] OR;  Service: Orthopedics;  Laterality: Right;   SHOULDER ARTHROSCOPY WITH ROTATOR CUFF REPAIR AND SUBACROMIAL DECOMPRESSION Right 09/20/2021   Procedure: SHOULDER MINI OPEN ROTATOR CUFF REPAIR AND SUBACROMIAL DECOMPRESSION WITH POSSIBLE PATCH GRAFT;  Surgeon: Jene Every, MD;  Location: [facility];  Service: Orthopedics;  Laterality: Right;  90 MINS   stent  Left 03/2017   anterior descending ; drug eluting ; tx of MI at [facility]    TONSILLECTOMY     VAGINAL HYSTERECTOMY     vaginal sling     Patient Active Problem List   Diagnosis Date Noted   Closed bicondylar fracture of distal femur (HCC) 08/20/2022   Hyponatremia 08/20/2022   Hypomagnesemia 05/13/2022   Pelvis fracture (HCC) 05/12/2022  Humeral head fracture, right, closed, initial encounter 05/12/2022   Elevated troponin 05/12/2022   Hematoma 05/12/2022   Hypertension 11/05/2021   Complete rotator cuff tear 09/20/2021   Closed intertrochanteric fracture of right hip (HCC) 07/11/2019   Closed right hip fracture (HCC) 07/11/2019   Chronic cholecystitis due to cholelithiasis with choledocholithiasis 11/05/2017   Cholelithiasis with chronic cholecystitis 11/01/2017   Cholelithiasis 05/18/2017   CAD in native artery 03/31/2017    Ischemic cardiomyopathy 03/31/2017   Hyperlipemia 03/31/2017   DJD (degenerative joint disease) 03/31/2017   Osteoporosis 03/31/2017    PCP: Renford Dills, MD  REFERRING PROVIDER: Dion Saucier D, PA  REFERRING DIAG: ORIF R Distal Femur FX 08/21/22 ORIF R Acetabulum FX 05/14/22  THERAPY DIAG:  Muscle weakness (generalized)  Pain in right hip  Stiffness of right hip, not elsewhere classified  Difficulty in walking, not elsewhere classified  Cramp and spasm  Abnormal posture  Rationale for Evaluation and Treatment: Rehabilitation  ONSET DATE: R hip ORIF 08/21/22  SUBJECTIVE:   SUBJECTIVE STATEMENT: Patient states she is doing fine.  "My knee has been a little bothersome"  PERTINENT HISTORY: From H&P: 87 yo female with history of CAD, ischemic cardiomyopathy, paroxysmal atrial fibrillation, HTN, HLD, PACs and spinal stenosis 3rd R hip fx, R shoulder fx  PAIN:  04/02/23 Are you having pain? Yes: NPRS scale: 3/10 Pain location: R front thigh and knee Pain description: dull Aggravating factors: standing/walking Relieving factors: rest  PRECAUTIONS: None  WEIGHT BEARING RESTRICTIONS: No  FALLS:  Has patient fallen in last 6 months? Yes. Number of falls 2  LIVING ENVIRONMENT: Lives with: lives alone Daughter lives close; family checks in daily Lives in: House/apartment Stairs: No Has following equipment at home: Dan Humphreys - 4 wheeled, shower chair, bed side commode, and Ramped entry  OCCUPATION: Retired  PATIENT GOALS: "I just want to get stronger and not be susceptible to losing balance"  NEXT MD VISIT: n/a  OBJECTIVE: (Measures in this section from initial evaluation unless otherwise noted)  DIAGNOSTIC FINDINGS: 08/21/22 hip x-ray demos ORIF  PATIENT SURVEYS:  Eval:  FOTO 40; predicted 53 02/11/2023:  FOTO 59 (goal met)  EDEMA:  Notes a little R posterior thigh swelling  MUSCLE LENGTH: Hamstrings: Right ~70 deg; Left ~70 deg Thomas test: Right -10  deg; Left 10 deg  POSTURE: flexed trunk   LOWER EXTREMITY ROM:  Active/Passive ROM Right eval Left eval Right 01/09/23 Right 03/17/23  Hip flexion 80/100 125/125 100/125 110/128    LOWER EXTREMITY MMT:  MMT Right eval Left eval Right 02/09/23 Right 03/17/23 Right 04/02/23  Hip flexion 3-* 5 3+ within available range  3+ 4-  Hip extension 3- 3+ 3 3+ 4-  Hip abduction 2+ 3- 3 3+ 4-  Hip adduction       Hip internal rotation       Hip external rotation       Knee flexion 4+ 5 5 5 5   Knee extension 3+ 5 4 4 4   Ankle dorsiflexion       Ankle plantarflexion       Ankle inversion       Ankle eversion        (Blank rows = not tested) * = concordant pain  FUNCTIONAL TESTS:  Eval: 5 times sit to stand: 12.95 sec Berg Balance test: 40/56  01/23/23 5 times sit<>stand: 10 sec Berg Balance test: 47/56  02/11/2023: 5 times sit to stand: 10.94 sec without UE Timed Up and Go (TUG):  12.24  sec with 4WRW 3 minute walk test:  481 ft with RPE of 8/10  03/17/2023: 5 times sit to stand: 10.85 sec without UE Timed Up and Go (TUG):  10.53 sec with 4WRW 3 minute walk test:  507.2 ft with RPE of 4/10   04/02/2023: 5 times sit to stand: 10.79 sec without UE Timed Up and Go (TUG):  10.72 sec with 4WRW 3 minute walk test:  338.2 ft with RPE of 4/10 with Cane!  05/07/2023: 5 times sit to stand: 9.80 sec without UE Timed Up and Go (TUG):  12.73 sec with SPC!  3 minute walk test:  408.11 ft with RPE of 4/10 with Cane!    GAIT: Distance walked: 100' Assistive device utilized: Environmental consultant - 4 wheeled Level of assistance: Modified independence Comments: Reciprocal gait, flexed trunk   TODAY'S TREATMENT:   DATE: 05/07/23 Nustep x 5 min level 3 (PT present to discuss status) Medicare re-assessment completed Reviewed HEP Sit to stand with min vc's for correct technique x 10 Gait training during walk test.  Min vc's needed for heel strike, step length and upright posture Side lying hip abduction  x 20 each side with therapist holding hips fwd Hooklying clam with yellow loop x 20 Side lying clam x 20 each side with yellow loop Hook lying bridge with clam with yellow loop 2 x 10  DATE: 05/05/23 Nustep x 5 min level 3 (PT present to discuss status) Side lying hip abduction x 20 each side with therapist holding hips fwd Hooklying clam with yellow loop x 20 Side lying clam x 20 each side with yellow loop Hook lying bridge with clam with yellow loop 2 x 10 Prone hip extension 2 x 10 Standing quad/hip flexor stretch at steps with chair behind 3 x 30 Lateral band walks at barre x 4 laps with emphasis on keeping feet wide for consistent concentric/eccentric work (blue band) Sit to stand 2 x 5 from chair with emphasis on hips back to reduce shearing force at PF joint.   DATE: 04/30/23 Nustep x 5 min level 3 (PT present to discuss status) Treadmill gait training: working on heel to toe progression (with increasing speed, both hands, then single hand, right then left) total time approx 10 min.  Gait training with SPC x 150 feet  Standing hip abduction and extension 2 x 10 in // bars Lateral band walks with red band in // bars 4 laps Squats in // bars x 10 Step up and hold fwd then lateral x 10 each onto balance pad in // bars (single UE support)   PATIENT EDUCATION:  Education details: Exam findings, POC, initial HEP Person educated: Patient Education method: Explanation, Demonstration, and Handouts Education comprehension: verbalized understanding, returned demonstration, and needs further education  HOME EXERCISE PROGRAM: Access Code: [email or web address] URL: [email or web address] Date: 02/19/2023 Prepared by: Mikey Kirschner  Exercises - Figure 4 Bridge (Mirrored)  - 1 x daily - 7 x weekly - 2 sets - 10 reps - Standing Hamstring Curl with Resistance  - 1 x daily - 7 x weekly - 2 sets - 10 reps - Sidelying Bent Knee Hip Flexion  - 1 x daily - 7 x weekly - 2 sets - 10 reps -  Standing 'L' Stretch at Counter  - 1 x daily - 7 x weekly - 2 sets - 30 sec hold - Standing Lumbar Extension with Counter  - 1 x daily - 7 x weekly - 1 sets - 5 reps - 5 sec  hold - Seated Piriformis Stretch  - 1 x daily - 7 x weekly - 2 sets - 30 sec hold - Seated Piriformis Stretch  - 1 x daily - 7 x weekly - 2 sets - 30 sec hold - Sidelying Hip Abduction  - 1 x daily - 7 x weekly - 2 sets - 10 reps - Prone Hip Extension - Two Pillows  - 1 x daily - 7 x weekly - 2 sets - 10 reps - Seated Straight Leg Raise with Support  - 1 x daily - 7 x weekly - 2 sets - 10 reps - Quadricep Stretch with Chair and Counter Support  - 1 x daily - 7 x weekly - 1 sets - 3 reps - 30 sec hold  ASSESSMENT:  CLINICAL IMPRESSION: Vivyana demonstrated several improvements today.  She was able to ambulate without an a.d. with no trendelenburg drop.  She was also able to complete increased distance using SPC with excellent gait speed, step length, heel strike and upright posture.  She is progressing toward final goals.   She would benefit from continued skilled PT for right hip strengthening and flexibility along with weight shifting.    OBJECTIVE IMPAIRMENTS: Abnormal gait, decreased activity tolerance, decreased balance, decreased endurance, decreased mobility, difficulty walking, decreased ROM, decreased strength, hypomobility, increased fascial restrictions, impaired flexibility, improper body mechanics, postural dysfunction, and pain.   GOALS: Goals reviewed with patient? Yes  SHORT TERM GOALS: Target date: 01/12/2023  Pt will be ind with initial HEP Baseline: Goal status: MET  2.  PT will assess 5x STS and set goals accordingly Baseline:  Goal status: MET  3.  PT will assess Berg Balance and set goals accordingly Baseline:  Goal status: MET  4.  Pt will be able to improve R hip flexion to at least 100 deg in sitting Baseline:  01/23/23: 100 deg AROM after stretching Goal status: MET   LONG TERM GOALS:  Target date: 04/03/2023   Pt will be ind with management and progression of HEP Baseline:  Goal status: IN PROGRESS  2.  Pt will have 5x STS of </=10 sec to demo decreased risk of falls and improved functional LE strength Baseline: 12.96 sec 01/23/23: 10 sec Goal status: IN PROGRESS  3.  Pt will have improved Berg Balance Score by at least 8 points to demo MCID for decreased fall risk Baseline: 40/56 01/23/23: 47/56 Goal status: IN PROGRESS  4.  Pt will be able to perform household amb (at least 400') without a/d  Baseline:  02/09/23: Walking with cane only when someone is at home for supervision; otherwise walking with rollator x10 min twice a day Goal status: IN PROGRESS  5.  Pt will be able to tolerate standing for at least 20 minutes to be able to cook/wash dishes Baseline:  Goal status: MET 02/09/23  6.  Pt will have improved FOTO to >/=53 Baseline: 02/09/23: 46 Goal status: MET on 02/11/2023   PLAN:  PT FREQUENCY: 1-2x/week  PT DURATION: 8 weeks  PLANNED INTERVENTIONS: Therapeutic exercises, Therapeutic activity, Neuromuscular re-education, Balance training, Gait training, Patient/Family education, Self Care, Joint mobilization, Stair training, Aquatic Therapy, Cryotherapy, Moist heat, Taping, Ionotophoresis 4mg /ml Dexamethasone, Manual therapy, and Re-evaluation  PLAN FOR NEXT SESSION: continue working on hip strength, TKE and end range quad strength on right as well as hip stability, gait training without a.d. and with cane.    Victorino Dike B. Tahsin Benyo, PT 05/08/23 3:39 PM [facility] [address] [place], [place] [address] Phone # [phone] Fax [phone]

## 2023-05-12 ENCOUNTER — Ambulatory Visit: Payer: Medicare Other | Attending: Physician Assistant

## 2023-05-12 DIAGNOSIS — R293 Abnormal posture: Secondary | ICD-10-CM | POA: Diagnosis not present

## 2023-05-12 DIAGNOSIS — R252 Cramp and spasm: Secondary | ICD-10-CM | POA: Diagnosis not present

## 2023-05-12 DIAGNOSIS — M6281 Muscle weakness (generalized): Secondary | ICD-10-CM | POA: Insufficient documentation

## 2023-05-12 DIAGNOSIS — R262 Difficulty in walking, not elsewhere classified: Secondary | ICD-10-CM | POA: Diagnosis not present

## 2023-05-12 DIAGNOSIS — M25651 Stiffness of right hip, not elsewhere classified: Secondary | ICD-10-CM | POA: Insufficient documentation

## 2023-05-12 DIAGNOSIS — M25551 Pain in right hip: Secondary | ICD-10-CM | POA: Insufficient documentation

## 2023-05-12 NOTE — Therapy (Signed)
OUTPATIENT PHYSICAL THERAPY TREATMENT   Patient Name: Carrie Barnes MRN: [id number] DOB:29-Dec-1935, 87 y.o., female Today's Date: 05/12/2023  END OF SESSION:  PT End of Session - 05/12/23 1154     Visit Number 31    Date for PT Re-Evaluation 05/28/23    Authorization Type BCBS Medicare    Progress Note Due on Visit 40    PT Start Time 1150    PT Stop Time 1230    PT Time Calculation (min) 40 min    Activity Tolerance Patient tolerated treatment well    Behavior During Therapy [facility] for tasks assessed/performed                  Past Medical History:  Diagnosis Date   Cholecystitis    Coronary artery disease    a. 03/2017: 80-90% LAD stenosis (PCI/DES placement with a 2.75x16 mm Promus Premier stent). No significant stenosis along RCA or LCx.    DJD (degenerative joint disease)    GERD (gastroesophageal reflux disease)    Hyperlipidemia    Hypertension    dx s/p MI    Ischemic cardiomyopathy    Myocardial infarction (HCC) 03/17/2017   03-2017 treated at [facility]    Osteoporosis    Status post insertion of drug-eluting stent into left anterior descending (LAD) artery 03/2017   Past Surgical History:  Procedure Laterality Date   APPENDECTOMY     CESAREAN SECTION     CHOLECYSTECTOMY N/A 11/05/2017   Procedure: LAPAROSCOPIC CHOLECYSTECTOMY WITH INTRAOPERATIVE CHOLANGIOGRAM;  Surgeon: Darnell Level, MD;  Location: [facility];  Service: General;  Laterality: N/A;   ERCP N/A 11/11/2017   Procedure: ENDOSCOPIC RETROGRADE CHOLANGIOPANCREATOGRAPHY (ERCP);  Surgeon: Willis Modena, MD;  Location: [facility];  Service: Endoscopy;  Laterality: N/A;  possible   EUS N/A 11/11/2017   Procedure: UPPER ENDOSCOPIC ULTRASOUND (EUS) RADIAL;  Surgeon: Willis Modena, MD;  Location: [facility];  Service: Endoscopy;  Laterality: N/A;   FEMUR IM NAIL Right 07/13/2019   Procedure: INTRAMEDULLARY (IM) RETROGRADE FEMORAL NAILING;  Surgeon: Ollen Gross, MD;  Location:  [facility];  Service: Orthopedics;  Laterality: Right;    LUMBAR LAMINECTOMY/ DECOMPRESSION WITH MET-RX     OPEN REDUCTION INTERNAL FIXATION ACETABULAR FRACTURE STOPPA Right 05/14/2022   Procedure: OPEN REDUCTION INTERNAL FIXATION RIGHT ACETABULUM;  Surgeon: Roby Lofts, MD;  Location: [facility];  Service: Orthopedics;  Laterality: Right;   ORIF FEMUR FRACTURE Right 08/21/2022   Procedure: RIGHT OPEN REDUCTION INTERNAL FIXATION (ORIF) DISTAL FEMUR FRACTURE;  Surgeon: Roby Lofts, MD;  Location: [facility];  Service: Orthopedics;  Laterality: Right;   REVERSE SHOULDER ARTHROPLASTY Right 05/15/2022   Procedure: RIGHT REVERSE SHOULDER ARTHROPLASTY;  Surgeon: Yolonda Kida, MD;  Location[facility];  Service: Orthopedics;  Laterality: Right;   SHOULDER ARTHROSCOPY WITH ROTATOR CUFF REPAIR AND SUBACROMIAL DECOMPRESSION Right 09/20/2021   Procedure: SHOULDER MINI OPEN ROTATOR CUFF REPAIR AND SUBACROMIAL DECOMPRESSION WITH POSSIBLE PATCH GRAFT;  Surgeon: Jene Every, MD;  Location: [facility];  Service: Orthopedics;  Laterality: Right;  90 MINS   stent  Left 03/2017   anterior descending ; drug eluting ; tx of MI at [facility]    TONSILLECTOMY     VAGINAL HYSTERECTOMY     vaginal sling     Patient Active Problem List   Diagnosis Date Noted   Closed bicondylar fracture of distal femur (HCC) 08/20/2022   Hyponatremia 08/20/2022   Hypomagnesemia 05/13/2022   Pelvis fracture (HCC) 05/12/2022  Humeral head fracture, right, closed, initial encounter 05/12/2022   Elevated troponin 05/12/2022   Hematoma 05/12/2022   Hypertension 11/05/2021   Complete rotator cuff tear 09/20/2021   Closed intertrochanteric fracture of right hip (HCC) 07/11/2019   Closed right hip fracture (HCC) 07/11/2019   Chronic cholecystitis due to cholelithiasis with choledocholithiasis 11/05/2017   Cholelithiasis with chronic cholecystitis 11/01/2017   Cholelithiasis 05/18/2017   CAD in native artery 03/31/2017    Ischemic cardiomyopathy 03/31/2017   Hyperlipemia 03/31/2017   DJD (degenerative joint disease) 03/31/2017   Osteoporosis 03/31/2017    PCP: Renford Dills, MD  REFERRING PROVIDER: Dion Saucier D, PA  REFERRING DIAG: ORIF R Distal Femur FX 08/21/22 ORIF R Acetabulum FX 05/14/22  THERAPY DIAG:  Muscle weakness (generalized)  Pain in right hip  Stiffness of right hip, not elsewhere classified  Difficulty in walking, not elsewhere classified  Cramp and spasm  Rationale for Evaluation and Treatment: Rehabilitation  ONSET DATE: R hip ORIF 08/21/22  SUBJECTIVE:   SUBJECTIVE STATEMENT: Patient states she continues to feel more comfortable with walking to/from dining room.  "I still use my walker occasionally"  PERTINENT HISTORY: From H&P: 87 yo female with history of CAD, ischemic cardiomyopathy, paroxysmal atrial fibrillation, HTN, HLD, PACs and spinal stenosis 3rd R hip fx, R shoulder fx  PAIN:  05/12/23 Are you having pain? Yes: NPRS scale: 2-3/10 Pain location: R front thigh and knee Pain description: dull Aggravating factors: standing/walking Relieving factors: rest  PRECAUTIONS: None  WEIGHT BEARING RESTRICTIONS: No  FALLS:  Has patient fallen in last 6 months? Yes. Number of falls 2  LIVING ENVIRONMENT: Lives with: lives alone Daughter lives close; family checks in daily Lives in: House/apartment Stairs: No Has following equipment at home: Dan Humphreys - 4 wheeled, shower chair, bed side commode, and Ramped entry  OCCUPATION: Retired  PATIENT GOALS: "I just want to get stronger and not be susceptible to losing balance"  NEXT MD VISIT: n/a  OBJECTIVE: (Measures in this section from initial evaluation unless otherwise noted)  DIAGNOSTIC FINDINGS: 08/21/22 hip x-ray demos ORIF  PATIENT SURVEYS:  Eval:  FOTO 40; predicted 53 02/11/2023:  FOTO 59 (goal met)  EDEMA:  Notes a little R posterior thigh swelling  MUSCLE LENGTH: Hamstrings: Right ~70 deg; Left  ~70 deg Thomas test: Right -10 deg; Left 10 deg  POSTURE: flexed trunk   LOWER EXTREMITY ROM:  Active/Passive ROM Right eval Left eval Right 01/09/23 Right 03/17/23  Hip flexion 80/100 125/125 100/125 110/128    LOWER EXTREMITY MMT:  MMT Right eval Left eval Right 02/09/23 Right 03/17/23 Right 04/02/23  Hip flexion 3-* 5 3+ within available range  3+ 4-  Hip extension 3- 3+ 3 3+ 4-  Hip abduction 2+ 3- 3 3+ 4-  Hip adduction       Hip internal rotation       Hip external rotation       Knee flexion 4+ 5 5 5 5   Knee extension 3+ 5 4 4 4   Ankle dorsiflexion       Ankle plantarflexion       Ankle inversion       Ankle eversion        (Blank rows = not tested) * = concordant pain  FUNCTIONAL TESTS:  Eval: 5 times sit to stand: 12.95 sec Berg Balance test: 40/56  01/23/23 5 times sit<>stand: 10 sec Berg Balance test: 47/56  02/11/2023: 5 times sit to stand: 10.94 sec without UE Timed Up and Go (  TUG):  12.24 sec with 4WRW 3 minute walk test:  481 ft with RPE of 8/10  03/17/2023: 5 times sit to stand: 10.85 sec without UE Timed Up and Go (TUG):  10.53 sec with 4WRW 3 minute walk test:  507.2 ft with RPE of 4/10   04/02/2023: 5 times sit to stand: 10.79 sec without UE Timed Up and Go (TUG):  10.72 sec with 4WRW 3 minute walk test:  338.2 ft with RPE of 4/10 with Cane!  05/07/2023: 5 times sit to stand: 9.80 sec without UE Timed Up and Go (TUG):  12.73 sec with SPC!  3 minute walk test:  408.11 ft with RPE of 4/10 with Cane!    GAIT: Distance walked: 100' Assistive device utilized: Environmental consultant - 4 wheeled Level of assistance: Modified independence Comments: Reciprocal gait, flexed trunk   TODAY'S TREATMENT:   DATE: 05/12/23 Nustep x 5 min level 3 (PT present to discuss status) Sit to stand with min vc's for correct technique x 10 feet side by side , then in tandem with right LE in back Lateral band walks with yellow loop x 3 laps  Monster walks with yellow loop 2  laps Hooklying clam with yellow loop x 20 Side lying clam x 20 each side with yellow loop Side lying hip abduction x 20 each side  Hook lying bridge 2 x 10 Supine quad set x 20 each LE Supine TKE with 5 lb ankle weights 2 x 10 each Standing TKE with red band x 20 each  DATE: 05/07/23 Nustep x 5 min level 3 (PT present to discuss status) Medicare re-assessment completed Reviewed HEP Sit to stand with min vc's for correct technique x 10 Gait training during walk test.  Min vc's needed for heel strike, step length and upright posture Side lying hip abduction x 20 each side with therapist holding hips fwd Hooklying clam with yellow loop x 20 Side lying clam x 20 each side with yellow loop Hook lying bridge with clam with yellow loop 2 x 10  DATE: 05/05/23 Nustep x 5 min level 3 (PT present to discuss status) Side lying hip abduction x 20 each side with therapist holding hips fwd Hooklying clam with yellow loop x 20 Side lying clam x 20 each side with yellow loop Hook lying bridge with clam with yellow loop 2 x 10 Prone hip extension 2 x 10 Standing quad/hip flexor stretch at steps with chair behind 3 x 30 Lateral band walks at barre x 4 laps with emphasis on keeping feet wide for consistent concentric/eccentric work (blue band) Sit to stand 2 x 5 from chair with emphasis on hips back to reduce shearing force at PF joint.    PATIENT EDUCATION:  Education details: Exam findings, POC, initial HEP Person educated: Patient Education method: Explanation, Demonstration, and Handouts Education comprehension: verbalized understanding, returned demonstration, and needs further education  HOME EXERCISE PROGRAM: Access Code: [email or web address] URL: [email or web address] Date: 02/19/2023 Prepared by: Mikey Kirschner  Exercises - Figure 4 Bridge (Mirrored)  - 1 x daily - 7 x weekly - 2 sets - 10 reps - Standing Hamstring Curl with Resistance  - 1 x daily - 7 x weekly - 2 sets - 10  reps - Sidelying Bent Knee Hip Flexion  - 1 x daily - 7 x weekly - 2 sets - 10 reps - Standing 'L' Stretch at Counter  - 1 x daily - 7 x weekly - 2 sets - 30 sec hold - Standing Lumbar  Extension with Counter  - 1 x daily - 7 x weekly - 1 sets - 5 reps - 5 sec hold - Seated Piriformis Stretch  - 1 x daily - 7 x weekly - 2 sets - 30 sec hold - Seated Piriformis Stretch  - 1 x daily - 7 x weekly - 2 sets - 30 sec hold - Sidelying Hip Abduction  - 1 x daily - 7 x weekly - 2 sets - 10 reps - Prone Hip Extension - Two Pillows  - 1 x daily - 7 x weekly - 2 sets - 10 reps - Seated Straight Leg Raise with Support  - 1 x daily - 7 x weekly - 2 sets - 10 reps - Quadricep Stretch with Chair and Counter Support  - 1 x daily - 7 x weekly - 1 sets - 3 reps - 30 sec hold  ASSESSMENT:  CLINICAL IMPRESSION: Tajuanna continues to increase her walking distance and is using walker less.  She does come into therapy with it each visit but she states that this is primarily due to navigating the doors and walkways.  She was able to complete all tasks today with ease.  She does continue to have difficulty bearing weight on right LE after a few reps of anything requiring increased weight shift to that side. She is nearing her final goals.  She would benefit from continued skilled PT for right hip strengthening and flexibility along with weight shifting.    OBJECTIVE IMPAIRMENTS: Abnormal gait, decreased activity tolerance, decreased balance, decreased endurance, decreased mobility, difficulty walking, decreased ROM, decreased strength, hypomobility, increased fascial restrictions, impaired flexibility, improper body mechanics, postural dysfunction, and pain.   GOALS: Goals reviewed with patient? Yes  SHORT TERM GOALS: Target date: 01/12/2023  Pt will be ind with initial HEP Baseline: Goal status: MET  2.  PT will assess 5x STS and set goals accordingly Baseline:  Goal status: MET  3.  PT will assess Berg Balance  and set goals accordingly Baseline:  Goal status: MET  4.  Pt will be able to improve R hip flexion to at least 100 deg in sitting Baseline:  01/23/23: 100 deg AROM after stretching Goal status: MET   LONG TERM GOALS: Target date: 04/03/2023   Pt will be ind with management and progression of HEP Baseline:  Goal status: MET 05/12/23  2.  Pt will have 5x STS of </=10 sec to demo decreased risk of falls and improved functional LE strength Baseline: 12.96 sec 01/23/23: 10 sec Goal status: IN PROGRESS  3.  Pt will have improved Berg Balance Score by at least 8 points to demo MCID for decreased fall risk Baseline: 40/56 01/23/23: 47/56 Goal status: IN PROGRESS  4.  Pt will be able to perform household amb (at least 400') without a/d  Baseline:  02/09/23: Walking with cane only when someone is at home for supervision; otherwise walking with rollator x10 min twice a day Goal status: IN PROGRESS  5.  Pt will be able to tolerate standing for at least 20 minutes to be able to cook/wash dishes Baseline:  Goal status: MET 02/09/23  6.  Pt will have improved FOTO to >/=53 Baseline: 02/09/23: 46 Goal status: MET on 02/11/2023   PLAN:  PT FREQUENCY: 1-2x/week  PT DURATION: 8 weeks  PLANNED INTERVENTIONS: Therapeutic exercises, Therapeutic activity, Neuromuscular re-education, Balance training, Gait training, Patient/Family education, Self Care, Joint mobilization, Stair training, Aquatic Therapy, Cryotherapy, Moist heat, Taping, Ionotophoresis 4mg /ml Dexamethasone, Manual  therapy, and Re-evaluation  PLAN FOR NEXT SESSION: continue working on hip strength, shift some focus to hip extension, TKE and end range quad strength on right as well as hip stability, gait training without a.d. and with cane.    Victorino Dike B. Mari Battaglia, PT 05/12/23 3:16 PM [facility] [address] [place], [place] [address] Phone # [phone] Fax [phone]

## 2023-05-14 ENCOUNTER — Ambulatory Visit: Payer: Medicare Other

## 2023-05-14 DIAGNOSIS — R293 Abnormal posture: Secondary | ICD-10-CM | POA: Diagnosis not present

## 2023-05-14 DIAGNOSIS — R252 Cramp and spasm: Secondary | ICD-10-CM

## 2023-05-14 DIAGNOSIS — M6281 Muscle weakness (generalized): Secondary | ICD-10-CM | POA: Diagnosis not present

## 2023-05-14 DIAGNOSIS — R262 Difficulty in walking, not elsewhere classified: Secondary | ICD-10-CM

## 2023-05-14 DIAGNOSIS — M25551 Pain in right hip: Secondary | ICD-10-CM | POA: Diagnosis not present

## 2023-05-14 DIAGNOSIS — M25651 Stiffness of right hip, not elsewhere classified: Secondary | ICD-10-CM | POA: Diagnosis not present

## 2023-05-14 NOTE — Therapy (Signed)
OUTPATIENT PHYSICAL THERAPY TREATMENT PHYSICAL THERAPY DISCHARGE SUMMARY  Visits from Start of Care: 32  Current functional level related to goals / functional outcomes: See below   Remaining deficits: See below   Education / Equipment: See below   Patient agrees to discharge. Patient goals were met. Patient is being discharged due to meeting the stated rehab goals.    Patient Name: Carrie Barnes MRN: [id number] DOB:1935-09-05, 87 y.o., female Today's Date: 05/14/2023  END OF SESSION:  PT End of Session - 05/14/23 1103     Visit Number 32    Date for PT Re-Evaluation 05/28/23    Authorization Type BCBS Medicare    Progress Note Due on Visit 40    PT Start Time 1100    PT Stop Time 1143    PT Time Calculation (min) 43 min    Activity Tolerance Patient tolerated treatment well    Behavior During Therapy [facility] for tasks assessed/performed                  Past Medical History:  Diagnosis Date   Cholecystitis    Coronary artery disease    a. 03/2017: 80-90% LAD stenosis (PCI/DES placement with a 2.75x16 mm Promus Premier stent). No significant stenosis along RCA or LCx.    DJD (degenerative joint disease)    GERD (gastroesophageal reflux disease)    Hyperlipidemia    Hypertension    dx s/p MI    Ischemic cardiomyopathy    Myocardial infarction (HCC) 03/17/2017   03-2017 treated at [facility]    Osteoporosis    Status post insertion of drug-eluting stent into left anterior descending (LAD) artery 03/2017   Past Surgical History:  Procedure Laterality Date   APPENDECTOMY     CESAREAN SECTION     CHOLECYSTECTOMY N/A 11/05/2017   Procedure: LAPAROSCOPIC CHOLECYSTECTOMY WITH INTRAOPERATIVE CHOLANGIOGRAM;  Surgeon: Darnell Level, MD;  Location: [facility];  Service: General;  Laterality: N/A;   ERCP N/A 11/11/2017   Procedure: ENDOSCOPIC RETROGRADE CHOLANGIOPANCREATOGRAPHY (ERCP);  Surgeon: Willis Modena, MD;  Location: [facility];  Service:  Endoscopy;  Laterality: N/A;  possible   EUS N/A 11/11/2017   Procedure: UPPER ENDOSCOPIC ULTRASOUND (EUS) RADIAL;  Surgeon: Willis Modena, MD;  Location: [facility];  Service: Endoscopy;  Laterality: N/A;   FEMUR IM NAIL Right 07/13/2019   Procedure: INTRAMEDULLARY (IM) RETROGRADE FEMORAL NAILING;  Surgeon: Ollen Gross, MD;  Location: [facility];  Service: Orthopedics;  Laterality: Right;    LUMBAR LAMINECTOMY/ DECOMPRESSION WITH MET-RX     OPEN REDUCTION INTERNAL FIXATION ACETABULAR FRACTURE STOPPA Right 05/14/2022   Procedure: OPEN REDUCTION INTERNAL FIXATION RIGHT ACETABULUM;  Surgeon: Roby Lofts, MD;  Location: [facility];  Service: Orthopedics;  Laterality: Right;   ORIF FEMUR FRACTURE Right 08/21/2022   Procedure: RIGHT OPEN REDUCTION INTERNAL FIXATION (ORIF) DISTAL FEMUR FRACTURE;  Surgeon: Roby Lofts, MD;  Location: [facility];  Service: Orthopedics;  Laterality: Right;   REVERSE SHOULDER ARTHROPLASTY Right 05/15/2022   Procedure: RIGHT REVERSE SHOULDER ARTHROPLASTY;  Surgeon: Yolonda Kida, MD;  Location: [facility] OR;  Service: Orthopedics;  Laterality: Right;   SHOULDER ARTHROSCOPY WITH ROTATOR CUFF REPAIR AND SUBACROMIAL DECOMPRESSION Right 09/20/2021   Procedure: SHOULDER MINI OPEN ROTATOR CUFF REPAIR AND SUBACROMIAL DECOMPRESSION WITH POSSIBLE PATCH GRAFT;  Surgeon: Jene Every, MD;  Location: [facility];  Service: Orthopedics;  Laterality: Right;  90 MINS   stent  Left 03/2017   anterior descending ; drug eluting ; tx of MI  at [facility]    TONSILLECTOMY     VAGINAL HYSTERECTOMY     vaginal sling     Patient Active Problem List   Diagnosis Date Noted   Closed bicondylar fracture of distal femur (HCC) 08/20/2022   Hyponatremia 08/20/2022   Hypomagnesemia 05/13/2022   Pelvis fracture (HCC) 05/12/2022   Humeral head fracture, right, closed, initial encounter 05/12/2022   Elevated troponin 05/12/2022   Hematoma 05/12/2022   Hypertension 11/05/2021    Complete rotator cuff tear 09/20/2021   Closed intertrochanteric fracture of right hip (HCC) 07/11/2019   Closed right hip fracture (HCC) 07/11/2019   Chronic cholecystitis due to cholelithiasis with choledocholithiasis 11/05/2017   Cholelithiasis with chronic cholecystitis 11/01/2017   Cholelithiasis 05/18/2017   CAD in native artery 03/31/2017   Ischemic cardiomyopathy 03/31/2017   Hyperlipemia 03/31/2017   DJD (degenerative joint disease) 03/31/2017   Osteoporosis 03/31/2017    PCP: Renford Dills, MD  REFERRING PROVIDER: Dion Saucier D, PA  REFERRING DIAG: ORIF R Distal Femur FX 08/21/22 ORIF R Acetabulum FX 05/14/22  THERAPY DIAG:  Muscle weakness (generalized)  Pain in right hip  Stiffness of right hip, not elsewhere classified  Difficulty in walking, not elsewhere classified  Cramp and spasm  Abnormal posture  Rationale for Evaluation and Treatment: Rehabilitation  ONSET DATE: R hip ORIF 08/21/22  SUBJECTIVE:   SUBJECTIVE STATEMENT: Patient states she is doing good.  "I really enjoyed that last session.  I felt challenged but it was refreshing that I could do those things".  Patient rates pain at 4/10 today and locates this pain at right shoulder and right hip.    PERTINENT HISTORY: From H&P: 87 yo female with history of CAD, ischemic cardiomyopathy, paroxysmal atrial fibrillation, HTN, HLD, PACs and spinal stenosis 3rd R hip fx, R shoulder fx  PAIN:  05/12/23 Are you having pain? Yes: NPRS scale: 2-3/10 Pain location: R front thigh and knee Pain description: dull Aggravating factors: standing/walking Relieving factors: rest  PRECAUTIONS: None  WEIGHT BEARING RESTRICTIONS: No  FALLS:  Has patient fallen in last 6 months? Yes. Number of falls 2  LIVING ENVIRONMENT: Lives with: lives alone Daughter lives close; family checks in daily Lives in: House/apartment Stairs: No Has following equipment at home: Dan Humphreys - 4 wheeled, shower chair, bed side  commode, and Ramped entry  OCCUPATION: Retired  PATIENT GOALS: "I just want to get stronger and not be susceptible to losing balance"  NEXT MD VISIT: n/a  OBJECTIVE: (Measures in this section from initial evaluation unless otherwise noted)  DIAGNOSTIC FINDINGS: 08/21/22 hip x-ray demos ORIF  PATIENT SURVEYS:  Eval:  FOTO 40; predicted 53 02/11/2023:  FOTO 59 (goal met)  EDEMA:  Notes a little R posterior thigh swelling  MUSCLE LENGTH: Hamstrings: Right ~70 deg; Left ~70 deg Thomas test: Right -10 deg; Left 10 deg  POSTURE: flexed trunk   LOWER EXTREMITY ROM:  Active/Passive ROM Right eval Left eval Right 01/09/23 Right 03/17/23  Hip flexion 80/100 125/125 100/125 110/128    LOWER EXTREMITY MMT:  MMT Right eval Left eval Right 02/09/23 Right 03/17/23 Right 04/02/23 Right  05/14/23  Hip flexion 3-* 5 3+ within available range  3+ 4- 4  Hip extension 3- 3+ 3 3+ 4- 4  Hip abduction 2+ 3- 3 3+ 4- 4+  Hip adduction        Hip internal rotation        Hip external rotation        Knee flexion  4+ 5 5 5 5 5   Knee extension 3+ 5 4 4 4 5   Ankle dorsiflexion        Ankle plantarflexion        Ankle inversion        Ankle eversion         (Blank rows = not tested) * = concordant pain  FUNCTIONAL TESTS:  Eval: 5 times sit to stand: 12.95 sec Berg Balance test: 40/56  01/23/23 5 times sit<>stand: 10 sec Berg Balance test: 47/56  02/11/2023: 5 times sit to stand: 10.94 sec without UE Timed Up and Go (TUG):  12.24 sec with 4WRW 3 minute walk test:  481 ft with RPE of 8/10  03/17/2023: 5 times sit to stand: 10.85 sec without UE Timed Up and Go (TUG):  10.53 sec with 4WRW 3 minute walk test:  507.2 ft with RPE of 4/10   04/02/2023: 5 times sit to stand: 10.79 sec without UE Timed Up and Go (TUG):  10.72 sec with 4WRW 3 minute walk test:  338.2 ft with RPE of 4/10 with Cane!  05/07/2023: 5 times sit to stand: 9.80 sec without UE Timed Up and Go (TUG):  12.73 sec with  SPC!  3 minute walk test:  408.11 ft with RPE of 4/10 with Cane!    GAIT: Distance walked: 100' Assistive device utilized: Environmental consultant - 4 wheeled Level of assistance: Modified independence Comments: Reciprocal gait, flexed trunk   TODAY'S TREATMENT:   DATE: 05/14/23 Nustep x 5 min level 3 (PT present to discuss status) Sit to stand with min vc's for correct technique x 10 feet side by side , then in tandem with right LE in back Lateral band walks with yellow loop x 3 laps at [employer] walks with yellow loop 3 laps at barre Hooklying clam with yellow loop x 20 Side lying clam x 20 each side with yellow loop Hook lying bridge 2 x 10 Side lying hip abduction x 20 each side Supine quad set x 20 each LE Supine TKE with 5 lb ankle weights 2 x 10 each over larger green noodle Supine SAQ with 5 lbs ankle weights 2 x 10 each LE over larger blue bolster Standing TKE with red band x 20 each DC plan reviewed: Patient will be going to her wellness center at [facility] and will continue her HEP  DATE: 05/12/23 Nustep x 5 min level 3 (PT present to discuss status) Sit to stand with min vc's for correct technique x 10 feet side by side , then in tandem with right LE in back Lateral band walks with yellow loop x 3 laps  Monster walks with yellow loop 2 laps Hooklying clam with yellow loop x 20 Side lying clam x 20 each side with yellow loop Side lying hip abduction x 20 each side  Hook lying bridge 2 x 10 Supine quad set x 20 each LE Supine TKE with 5 lb ankle weights 2 x 10 each Standing TKE with red band x 20 each  DATE: 05/07/23 Nustep x 5 min level 3 (PT present to discuss status) Medicare re-assessment completed Reviewed HEP Sit to stand with min vc's for correct technique x 10 Gait training during walk test.  Min vc's needed for heel strike, step length and upright posture Side lying hip abduction x 20 each side with therapist holding hips fwd Hooklying clam with yellow loop x  20 Side lying clam x 20 each side with yellow loop Hook lying  bridge with clam with yellow loop 2 x 10  DATE: 05/05/23 Nustep x 5 min level 3 (PT present to discuss status) Side lying hip abduction x 20 each side with therapist holding hips fwd Hooklying clam with yellow loop x 20 Side lying clam x 20 each side with yellow loop Hook lying bridge with clam with yellow loop 2 x 10 Prone hip extension 2 x 10 Standing quad/hip flexor stretch at steps with chair behind 3 x 30 Lateral band walks at barre x 4 laps with emphasis on keeping feet wide for consistent concentric/eccentric work (blue band) Sit to stand 2 x 5 from chair with emphasis on hips back to reduce shearing force at PF joint.    PATIENT EDUCATION:  Education details: Exam findings, POC, initial HEP Person educated: Patient Education method: Explanation, Demonstration, and Handouts Education comprehension: verbalized understanding, returned demonstration, and needs further education  HOME EXERCISE PROGRAM: Access Code: [email or web address] URL: [email or web address] Date: 02/19/2023 Prepared by: Mikey Kirschner  Exercises - Figure 4 Bridge (Mirrored)  - 1 x daily - 7 x weekly - 2 sets - 10 reps - Standing Hamstring Curl with Resistance  - 1 x daily - 7 x weekly - 2 sets - 10 reps - Sidelying Bent Knee Hip Flexion  - 1 x daily - 7 x weekly - 2 sets - 10 reps - Standing 'L' Stretch at Counter  - 1 x daily - 7 x weekly - 2 sets - 30 sec hold - Standing Lumbar Extension with Counter  - 1 x daily - 7 x weekly - 1 sets - 5 reps - 5 sec hold - Seated Piriformis Stretch  - 1 x daily - 7 x weekly - 2 sets - 30 sec hold - Seated Piriformis Stretch  - 1 x daily - 7 x weekly - 2 sets - 30 sec hold - Sidelying Hip Abduction  - 1 x daily - 7 x weekly - 2 sets - 10 reps - Prone Hip Extension - Two Pillows  - 1 x daily - 7 x weekly - 2 sets - 10 reps - Seated Straight Leg Raise with Support  - 1 x daily - 7 x weekly - 2 sets - 10  reps - Quadricep Stretch with Chair and Counter Support  - 1 x daily - 7 x weekly - 1 sets - 3 reps - 30 sec hold  ASSESSMENT:  CLINICAL IMPRESSION: Carrie Barnes has met all goals.  She is having less pain and walking with cane or even no a.d. most of the time.  She only takes her walker when she is leaving her community.  She is well motivated and compliant.  She should continue to do well.  We will DC at this time.    OBJECTIVE IMPAIRMENTS: Abnormal gait, decreased activity tolerance, decreased balance, decreased endurance, decreased mobility, difficulty walking, decreased ROM, decreased strength, hypomobility, increased fascial restrictions, impaired flexibility, improper body mechanics, postural dysfunction, and pain.   GOALS: Goals reviewed with patient? Yes  SHORT TERM GOALS: Target date: 01/12/2023  Pt will be ind with initial HEP Baseline: Goal status: MET  2.  PT will assess 5x STS and set goals accordingly Baseline:  Goal status: MET  3.  PT will assess Berg Balance and set goals accordingly Baseline:  Goal status: MET  4.  Pt will be able to improve R hip flexion to at least 100 deg in sitting Baseline:  01/23/23: 100 deg AROM after stretching Goal status: MET  LONG TERM GOALS: Target date: 04/03/2023   Pt will be ind with management and progression of HEP Baseline:  Goal status: MET 05/12/23  2.  Pt will have 5x STS of </=10 sec to demo decreased risk of falls and improved functional LE strength Baseline: 12.96 sec 01/23/23: 10 sec  05/07/23: 9.80 sec Goal status: MET 05/14/23  3.  Pt will have improved Berg Balance Score by at least 8 points to demo MCID for decreased fall risk Baseline: 40/56 01/23/23: 47/56 Goal status: REVISED did not do this test on DC assessment due to multiple other tests  4.  Pt will be able to perform household amb (at least 400') without a/d  Baseline:  02/09/23: Walking with cane only when someone is at home for supervision; otherwise  walking with rollator x10 min twice a day Goal status: MET 05/14/23  5.  Pt will be able to tolerate standing for at least 20 minutes to be able to cook/wash dishes Baseline:  Goal status: MET 02/09/23  6.  Pt will have improved FOTO to >/=53 Baseline: 02/09/23: 46 Goal status: MET on 02/11/2023   PLAN:  PT FREQUENCY: 1-2x/week  PT DURATION: 8 weeks  PLANNED INTERVENTIONS: Therapeutic exercises, Therapeutic activity, Neuromuscular re-education, Balance training, Gait training, Patient/Family education, Self Care, Joint mobilization, Stair training, Aquatic Therapy, Cryotherapy, Moist heat, Taping, Ionotophoresis 4mg /ml Dexamethasone, Manual therapy, and Re-evaluation  PLAN FOR NEXT SESSION: Patient will be discharged at this time.    Victorino Dike B. Lysbeth Dicola, PT 05/14/23 12:00 PM  [facility] [address], [address] [place], [place] [address] Phone # [phone] Fax [phone]

## 2023-05-19 DIAGNOSIS — R300 Dysuria: Secondary | ICD-10-CM | POA: Diagnosis not present

## 2023-05-27 DIAGNOSIS — N390 Urinary tract infection, site not specified: Secondary | ICD-10-CM | POA: Diagnosis not present

## 2023-05-28 ENCOUNTER — Ambulatory Visit: Payer: Medicare Other

## 2023-06-02 DIAGNOSIS — M810 Age-related osteoporosis without current pathological fracture: Secondary | ICD-10-CM | POA: Diagnosis not present

## 2023-06-02 DIAGNOSIS — Z5181 Encounter for therapeutic drug level monitoring: Secondary | ICD-10-CM | POA: Diagnosis not present

## 2023-06-16 DIAGNOSIS — Z471 Aftercare following joint replacement surgery: Secondary | ICD-10-CM | POA: Diagnosis not present

## 2023-06-16 DIAGNOSIS — Z96611 Presence of right artificial shoulder joint: Secondary | ICD-10-CM | POA: Diagnosis not present

## 2023-06-18 DIAGNOSIS — N3281 Overactive bladder: Secondary | ICD-10-CM | POA: Diagnosis not present

## 2023-06-18 DIAGNOSIS — N952 Postmenopausal atrophic vaginitis: Secondary | ICD-10-CM | POA: Diagnosis not present

## 2023-06-22 ENCOUNTER — Other Ambulatory Visit: Payer: Self-pay | Admitting: Internal Medicine

## 2023-06-22 DIAGNOSIS — Z96611 Presence of right artificial shoulder joint: Secondary | ICD-10-CM | POA: Diagnosis not present

## 2023-06-22 DIAGNOSIS — E7800 Pure hypercholesterolemia, unspecified: Secondary | ICD-10-CM | POA: Diagnosis not present

## 2023-06-22 DIAGNOSIS — E041 Nontoxic single thyroid nodule: Secondary | ICD-10-CM

## 2023-06-22 DIAGNOSIS — Z23 Encounter for immunization: Secondary | ICD-10-CM | POA: Diagnosis not present

## 2023-06-22 DIAGNOSIS — I480 Paroxysmal atrial fibrillation: Secondary | ICD-10-CM | POA: Diagnosis not present

## 2023-06-22 DIAGNOSIS — I10 Essential (primary) hypertension: Secondary | ICD-10-CM | POA: Diagnosis not present

## 2023-06-22 DIAGNOSIS — I700 Atherosclerosis of aorta: Secondary | ICD-10-CM | POA: Diagnosis not present

## 2023-06-22 DIAGNOSIS — I2510 Atherosclerotic heart disease of native coronary artery without angina pectoris: Secondary | ICD-10-CM | POA: Diagnosis not present

## 2023-06-22 DIAGNOSIS — D6869 Other thrombophilia: Secondary | ICD-10-CM | POA: Diagnosis not present

## 2023-06-23 ENCOUNTER — Other Ambulatory Visit: Payer: Self-pay | Admitting: *Deleted

## 2023-06-23 DIAGNOSIS — I480 Paroxysmal atrial fibrillation: Secondary | ICD-10-CM

## 2023-06-23 MED ORDER — APIXABAN 5 MG PO TABS: 5.0000 mg | ORAL_TABLET | Freq: Two times a day (BID) | ORAL | 1 refills | Status: DC

## 2023-06-23 NOTE — Telephone Encounter (Signed)
Eliquis 5mg  refill request received. Patient is 87 years old, weight-68.6kg, Crea-0.66 on 08/22/22, Diagnosis-Afib, and last seen by Dr. Clifton James on 11/17/22. Dose is appropriate based on dosing criteria. Will send in refill to requested pharmacy.

## 2023-06-24 ENCOUNTER — Ambulatory Visit
Admission: RE | Admit: 2023-06-24 | Discharge: 2023-06-24 | Disposition: A | Discharge status: Home / Self Care | Payer: Medicare Other | Source: Ambulatory Visit | Attending: Internal Medicine | Admitting: Internal Medicine

## 2023-06-24 DIAGNOSIS — E041 Nontoxic single thyroid nodule: Secondary | ICD-10-CM

## 2023-06-30 ENCOUNTER — Encounter: Payer: Self-pay | Admitting: Cardiovascular Disease

## 2023-07-01 DIAGNOSIS — R300 Dysuria: Secondary | ICD-10-CM | POA: Diagnosis not present

## 2023-07-02 ENCOUNTER — Other Ambulatory Visit: Payer: Self-pay | Admitting: Cardiovascular Disease

## 2023-07-02 DIAGNOSIS — I480 Paroxysmal atrial fibrillation: Secondary | ICD-10-CM

## 2023-07-02 NOTE — Telephone Encounter (Addendum)
Eliquis 5mg  refill request received. Patient is 87 years old, weight-68.6kg, Crea-0.66 on 08/22/22, Diagnosis-Afib, and last seen by Dr. Clifton James on 11/17/22. Dose is appropriate based on dosing criteria.   Refill was sent on 06/23/23 to [facility] and this one is to [facility]PT DOES NOT USE. Spoke with pt she states [facility] delivers her meds via [employer]  Spoke with [place] and she states from her end it looks as if the eliquis refill is on the way but Herbert Seta who takes care of this is not there at the moment so she will have her follow up with [place] if needed.   Will not send this to the [facility] since her meds are not sent there. Also, pt states she does have a 2 week supply of eliquis left at this time and has reached out to our team regarding paperwork for assistance.

## 2023-07-02 NOTE — Telephone Encounter (Signed)
refills *STAT* If patient is at the pharmacy, call can be transferred to refill team.   1. Which medications need to be refilled? (please list name of each medication and dose if known) apixaban (ELIQUIS) 5 MG TABS tablet [[id number]]    2. Would you like to learn more about the convenience, safety, & potential cost savings by using the [facility]? na     3. Are you open to using the [facility] (Type [facility]. na.   4. Which pharmacy/location (including street and city if local pharmacy) is medication to be sent to[facility]    5. Do they need a 30 day or 90 day supply? 90

## 2023-07-02 NOTE — Telephone Encounter (Signed)
RX routed to [facility]

## 2023-07-07 ENCOUNTER — Other Ambulatory Visit: Payer: Self-pay

## 2023-07-07 DIAGNOSIS — I480 Paroxysmal atrial fibrillation: Secondary | ICD-10-CM

## 2023-07-07 MED ORDER — APIXABAN 5 MG PO TABS: 5.0000 mg | ORAL_TABLET | Freq: Two times a day (BID) | ORAL | 1 refills | Status: DC

## 2023-07-07 NOTE — Telephone Encounter (Signed)
Prescription refill request for Eliquis received. Indication: Afib  Last office visit: 11/17/22 Clifton James)  Scr: 0.81 (06/02/23)  Age: 87 Weight: 68.6kg  Appropriate dose. Refill sent.

## 2023-07-20 DIAGNOSIS — R300 Dysuria: Secondary | ICD-10-CM | POA: Diagnosis not present

## 2023-08-13 ENCOUNTER — Other Ambulatory Visit: Payer: Self-pay | Admitting: Cardiovascular Disease

## 2023-08-14 MED ORDER — DILTIAZEM HCL ER COATED BEADS 120 MG PO CP24: 120.0000 mg | ORAL_CAPSULE | Freq: Every day | ORAL | 0 refills | Status: DC

## 2023-08-21 DIAGNOSIS — H401122 Primary open-angle glaucoma, left eye, moderate stage: Secondary | ICD-10-CM | POA: Diagnosis not present

## 2023-08-21 DIAGNOSIS — H04123 Dry eye syndrome of bilateral lacrimal glands: Secondary | ICD-10-CM | POA: Diagnosis not present

## 2023-08-25 DIAGNOSIS — H401122 Primary open-angle glaucoma, left eye, moderate stage: Secondary | ICD-10-CM | POA: Diagnosis not present

## 2023-08-26 DIAGNOSIS — M47896 Other spondylosis, lumbar region: Secondary | ICD-10-CM | POA: Diagnosis not present

## 2023-09-18 DIAGNOSIS — N390 Urinary tract infection, site not specified: Secondary | ICD-10-CM | POA: Diagnosis not present

## 2023-09-18 DIAGNOSIS — N3281 Overactive bladder: Secondary | ICD-10-CM | POA: Diagnosis not present

## 2023-10-27 DIAGNOSIS — H401122 Primary open-angle glaucoma, left eye, moderate stage: Secondary | ICD-10-CM | POA: Diagnosis not present

## 2023-11-03 ENCOUNTER — Other Ambulatory Visit: Payer: Self-pay | Admitting: Internal Medicine

## 2023-11-03 DIAGNOSIS — M8580 Other specified disorders of bone density and structure, unspecified site: Secondary | ICD-10-CM

## 2023-11-03 DIAGNOSIS — I700 Atherosclerosis of aorta: Secondary | ICD-10-CM | POA: Diagnosis not present

## 2023-11-03 DIAGNOSIS — I2510 Atherosclerotic heart disease of native coronary artery without angina pectoris: Secondary | ICD-10-CM | POA: Diagnosis not present

## 2023-11-03 DIAGNOSIS — D6869 Other thrombophilia: Secondary | ICD-10-CM | POA: Diagnosis not present

## 2023-11-03 DIAGNOSIS — Z1231 Encounter for screening mammogram for malignant neoplasm of breast: Secondary | ICD-10-CM

## 2023-11-03 DIAGNOSIS — E7800 Pure hypercholesterolemia, unspecified: Secondary | ICD-10-CM | POA: Diagnosis not present

## 2023-11-03 DIAGNOSIS — E041 Nontoxic single thyroid nodule: Secondary | ICD-10-CM | POA: Diagnosis not present

## 2023-11-03 DIAGNOSIS — Z1331 Encounter for screening for depression: Secondary | ICD-10-CM | POA: Diagnosis not present

## 2023-11-03 DIAGNOSIS — Z0000 Encounter for general adult medical examination without abnormal findings: Secondary | ICD-10-CM | POA: Diagnosis not present

## 2023-11-03 DIAGNOSIS — I10 Essential (primary) hypertension: Secondary | ICD-10-CM | POA: Diagnosis not present

## 2023-11-12 IMAGING — MG MM DIGITAL SCREENING BILAT W/ TOMO AND CAD
6 of 10 series · 6 of 30 positions shown · non-contrast
Comparison: Previous exam(s).

CLINICAL DATA: Screening.

EXAM:
DIGITAL SCREENING BILATERAL MAMMOGRAM WITH TOMOSYNTHESIS AND CAD
TECHNIQUE: Bilateral screening digital craniocaudal and mediolateral oblique
mammograms were obtained. Bilateral screening digital breast
tomosynthesis was performed. The images were evaluated with
computer-aided detection.

[R MLO synth-2D (1 of 2)]
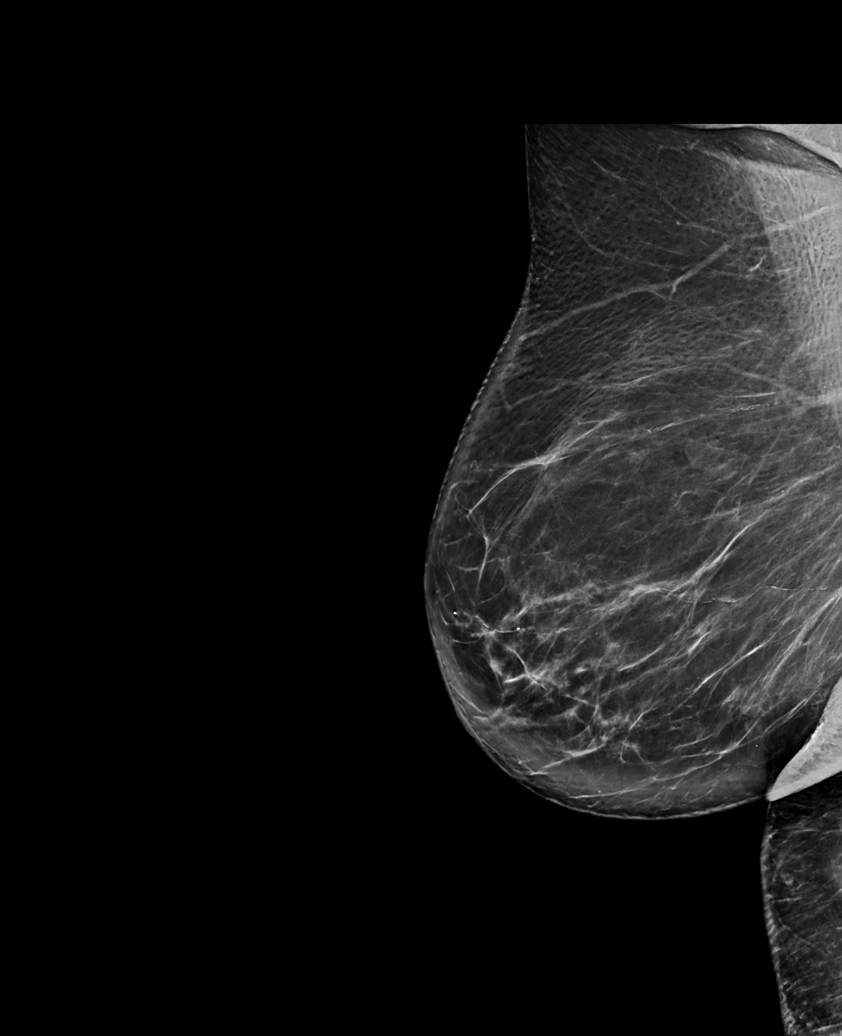

[R MLO synth-2D (2 of 2)]
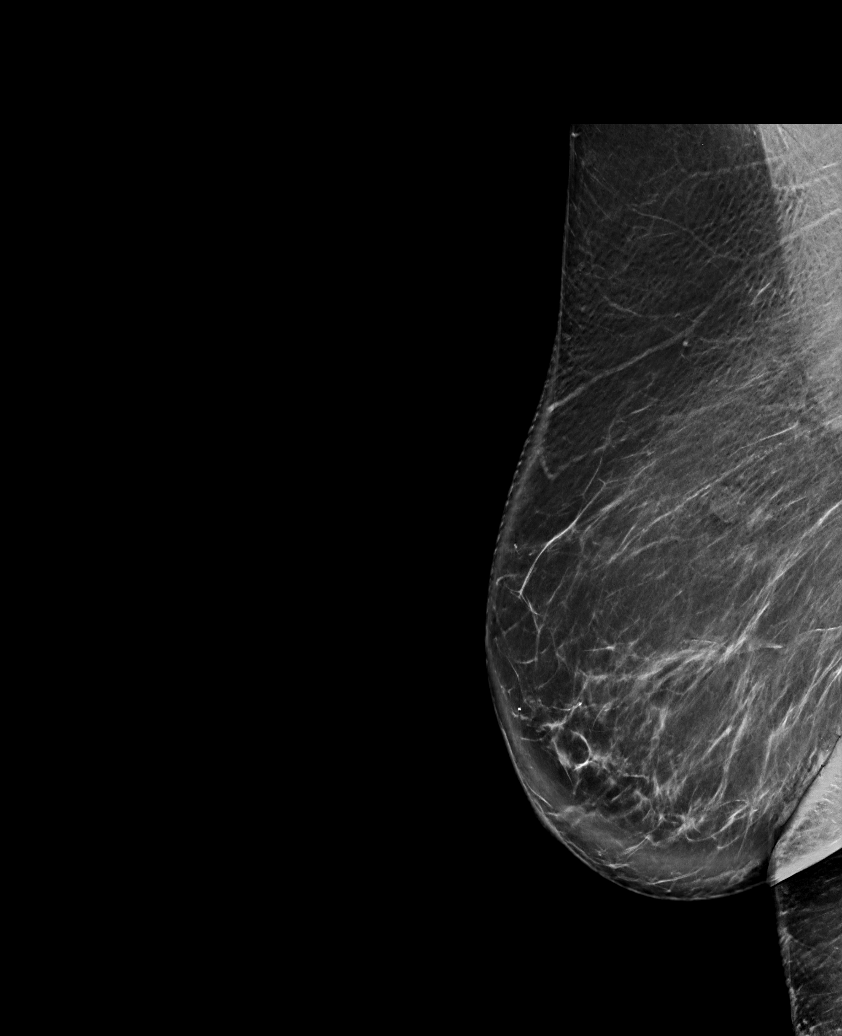

[L MLO synth-2D]
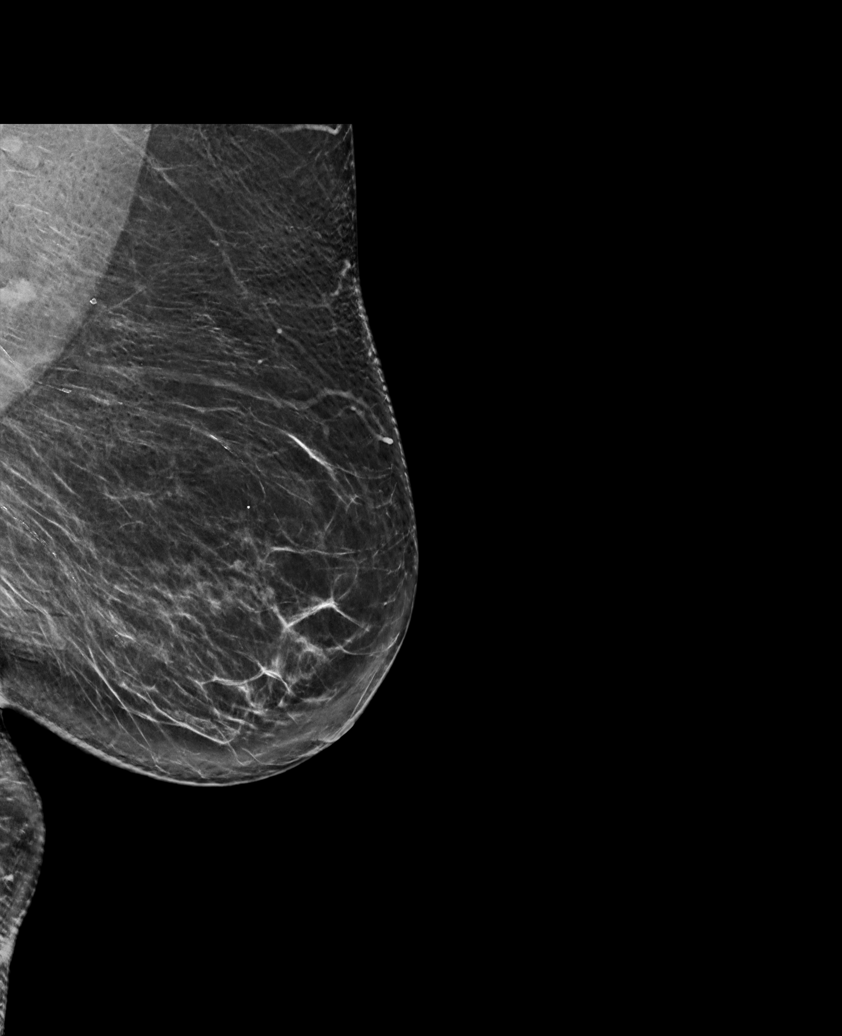

[L CC synth-2D]
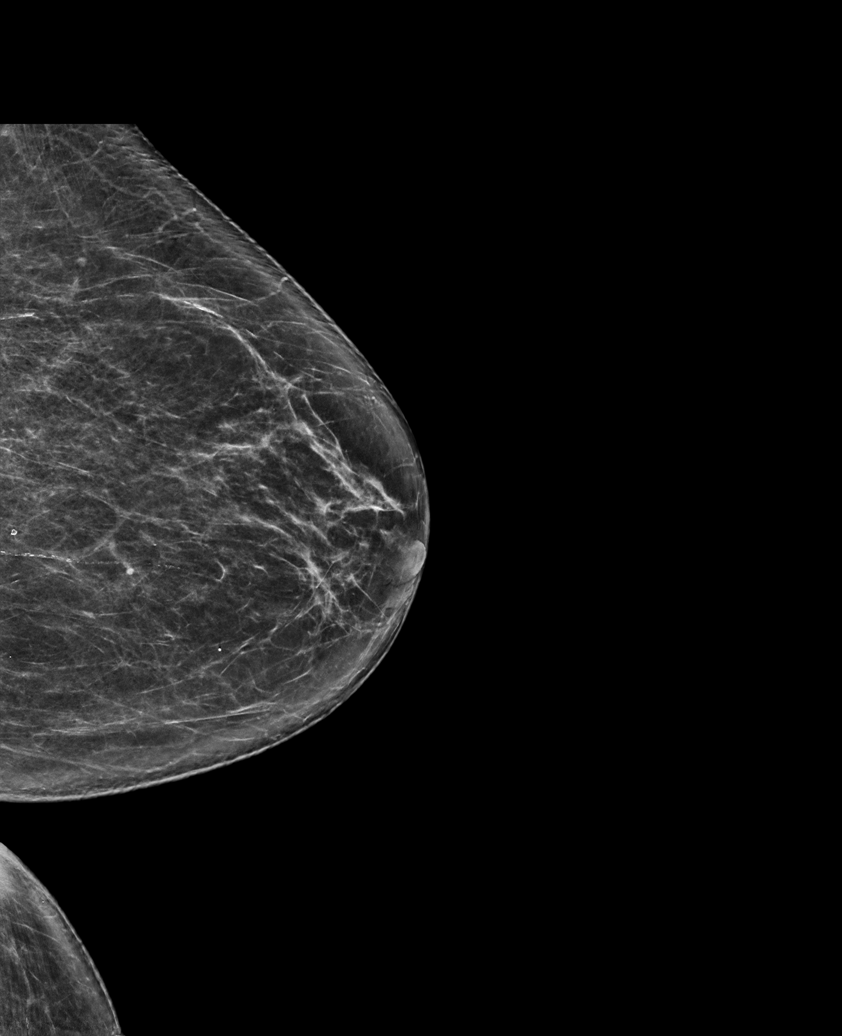

[R CC synth-2D]
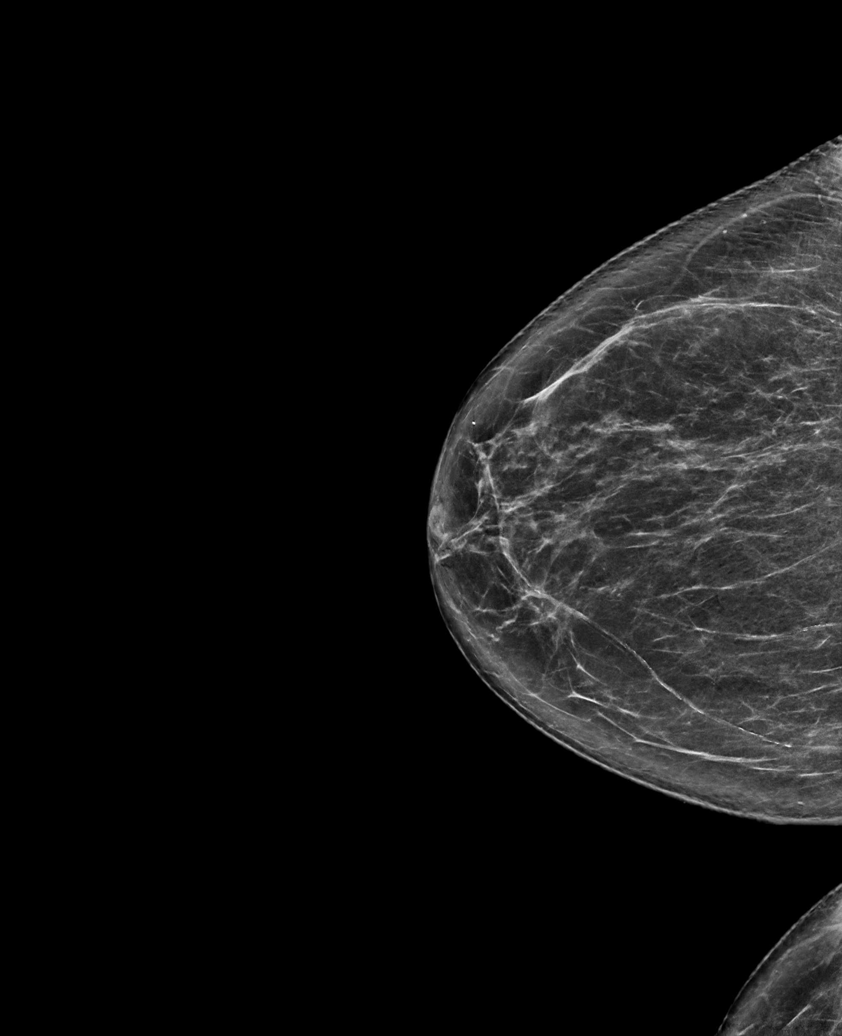

[L MLO tomo · tomo slice 41/81.0]
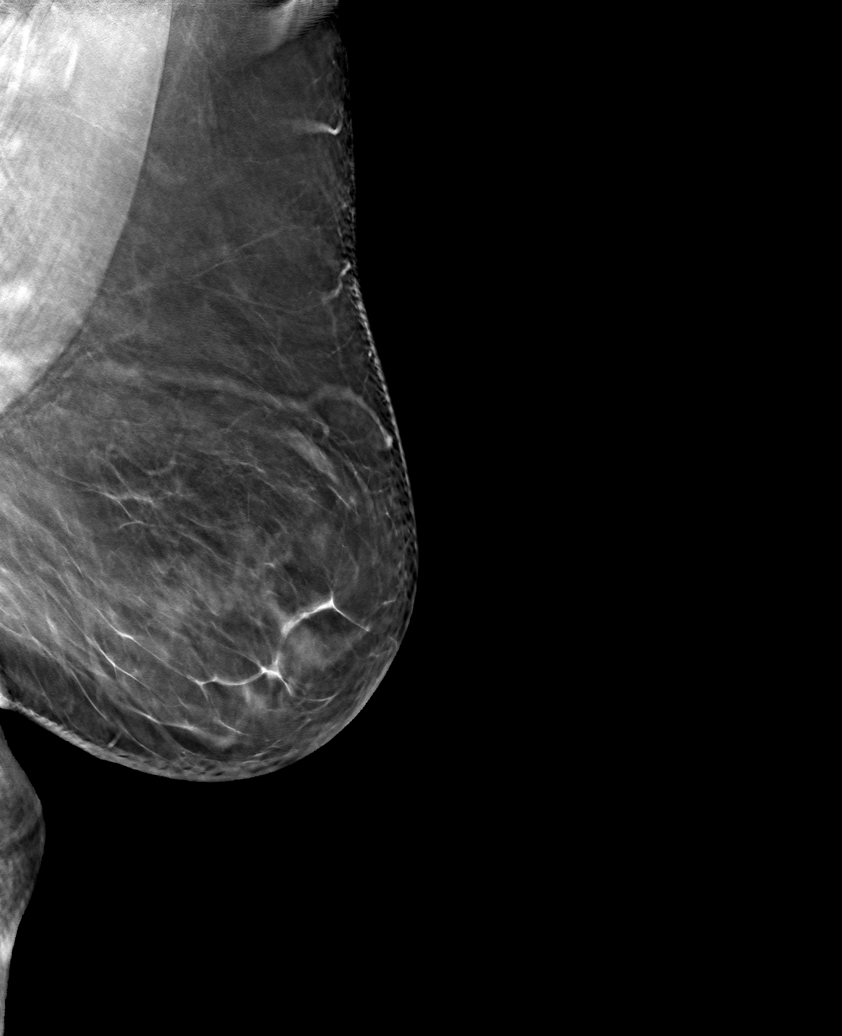

[6 of 30 positions shown; findings below may reference images not displayed]

ACR Breast Density Category b: There are scattered areas of
fibroglandular density.
FINDINGS: There are no findings suspicious for malignancy.
IMPRESSION: No mammographic evidence of malignancy. A result letter of this
screening mammogram will be mailed directly to the patient.

RECOMMENDATION:
Screening mammogram in one year. (Code:51-O-LD2)

BI-RADS CATEGORY  1: Negative.

## 2023-11-18 ENCOUNTER — Encounter: Payer: Self-pay | Admitting: Cardiovascular Disease

## 2023-11-18 ENCOUNTER — Ambulatory Visit: Payer: Medicare Other | Attending: Cardiovascular Disease | Admitting: Cardiovascular Disease

## 2023-11-18 VITALS — BP 152/78 | HR 66 | Ht 62.0 in | Wt 160.2 lb

## 2023-11-18 DIAGNOSIS — E785 Hyperlipidemia, unspecified: Secondary | ICD-10-CM | POA: Diagnosis not present

## 2023-11-18 DIAGNOSIS — I2510 Atherosclerotic heart disease of native coronary artery without angina pectoris: Secondary | ICD-10-CM | POA: Diagnosis not present

## 2023-11-18 DIAGNOSIS — I10 Essential (primary) hypertension: Secondary | ICD-10-CM | POA: Diagnosis not present

## 2023-11-18 DIAGNOSIS — I480 Paroxysmal atrial fibrillation: Secondary | ICD-10-CM

## 2023-11-18 DIAGNOSIS — I255 Ischemic cardiomyopathy: Secondary | ICD-10-CM | POA: Diagnosis not present

## 2023-11-18 MED ORDER — NITROGLYCERIN 0.4 MG SL SUBL: 0.4000 mg | SUBLINGUAL_TABLET | SUBLINGUAL | 3 refills | Status: AC | PRN

## 2023-11-18 NOTE — Progress Notes (Signed)
 Chief Complaint  Patient presents with   Follow-up    CAD    History of Present Illness: 88 yo female with history of CAD, ischemic cardiomyopathy, paroxysmal atrial fibrillation, HTN, HLD, PACs and spinal stenosis here today for cardiac follow up. She was admitted to [facility] in August 2018 with an MI and had a drug eluting stent placed in the LAD. LVEF at the time of her MI was 35-40% but improved to 55-60% on echo here in our office September 2018. Cardiac monitor July 2020 with sinus and PACs. Echo December 2020 with LVEF=60-65%, normal RV function. No valve disease. HCTZ added at ED visit on 05/16/21 for hypertensive urgency. ED visit on 09/27/21 with BP 184/79. She then had low blood pressure so she stopped the HCTZ and irbesartan. She has been taking Cardizem and Toprol since then and doing well. Echo October 2023 with LVEF=65-70%. No valve disease. She was seen in the ED 08/12/22 with c/o weakness. She was found to have a pneumonia and was given antibiotics. While there she had atrial fib with RVR but converted to sinus on Cardizem drip. She was started on Eliquis.   She is here today for follow up. The patient denies any chest pain, dyspnea, palpitations, lower extremity edema, orthopnea, PND, dizziness, near syncope or syncope.   Primary Care Physician: Renford Dills, MD  Past Medical History:  Diagnosis Date   Cholecystitis    Coronary artery disease    a. 03/2017: 80-90% LAD stenosis (PCI/DES placement with a 2.75x16 mm Promus Premier stent). No significant stenosis along RCA or LCx.    DJD (degenerative joint disease)    GERD (gastroesophageal reflux disease)    Hyperlipidemia    Hypertension    dx s/p MI    Ischemic cardiomyopathy    Myocardial infarction (HCC) 03/17/2017   03-2017 treated at [facility]    Osteoporosis    Status post insertion of drug-eluting stent into left anterior descending (LAD) artery 03/2017    Past Surgical History:   Procedure Laterality Date   APPENDECTOMY     CESAREAN SECTION     CHOLECYSTECTOMY N/A 11/05/2017   Procedure: LAPAROSCOPIC CHOLECYSTECTOMY WITH INTRAOPERATIVE CHOLANGIOGRAM;  Surgeon: Darnell Level, MD;  Location: [facility];  Service: General;  Laterality: N/A;   ERCP N/A 11/11/2017   Procedure: ENDOSCOPIC RETROGRADE CHOLANGIOPANCREATOGRAPHY (ERCP);  Surgeon: Willis Modena, MD;  Location: [facility];  Service: Endoscopy;  Laterality: N/A;  possible   EUS N/A 11/11/2017   Procedure: UPPER ENDOSCOPIC ULTRASOUND (EUS) RADIAL;  Surgeon: Willis Modena, MD;  Location: [facility];  Service: Endoscopy;  Laterality: N/A;   FEMUR IM NAIL Right 07/13/2019   Procedure: INTRAMEDULLARY (IM) RETROGRADE FEMORAL NAILING;  Surgeon: Ollen Gross, MD;  Location: [facility];  Service: Orthopedics;  Laterality: Right;    LUMBAR LAMINECTOMY/ DECOMPRESSION WITH MET-RX     OPEN REDUCTION INTERNAL FIXATION ACETABULAR FRACTURE STOPPA Right 05/14/2022   Procedure: OPEN REDUCTION INTERNAL FIXATION RIGHT ACETABULUM;  Surgeon: Roby Lofts, MD;  Location: [facility] OR;  Service: Orthopedics;  Laterality: Right;   ORIF FEMUR FRACTURE Right 08/21/2022   Procedure: RIGHT OPEN REDUCTION INTERNAL FIXATION (ORIF) DISTAL FEMUR FRACTURE;  Surgeon: Roby Lofts, MD;  Location: [facility] OR;  Service: Orthopedics;  Laterality: Right;   REVERSE SHOULDER ARTHROPLASTY Right 05/15/2022   Procedure: RIGHT REVERSE SHOULDER ARTHROPLASTY;  Surgeon: Yolonda Kida, MD;  Location: [facility] OR;  Service: Orthopedics;  Laterality: Right;   SHOULDER ARTHROSCOPY WITH ROTATOR CUFF  REPAIR AND SUBACROMIAL DECOMPRESSION Right 09/20/2021   Procedure: SHOULDER MINI OPEN ROTATOR CUFF REPAIR AND SUBACROMIAL DECOMPRESSION WITH POSSIBLE PATCH GRAFT;  Surgeon: Jene Every, MD;  Location: [facility];  Service: Orthopedics;  Laterality: Right;  90 MINS   stent  Left 03/2017   anterior descending ; drug eluting ; tx of MI at [facility]     TONSILLECTOMY     VAGINAL HYSTERECTOMY     vaginal sling      Current Outpatient Medications  Medication Sig Dispense Refill   acetaminophen (TYLENOL) 500 MG tablet Take 1,000 mg by mouth See admin instructions. Take 1,000 mg by mouth at bedtime and an additional 1,000 mg once a day as needed for pain     apixaban (ELIQUIS) 5 MG TABS tablet Take 1 tablet (5 mg total) by mouth 2 (two) times daily. 180 tablet 1   ascorbic acid (VITAMIN C) 500 MG tablet Take 500 mg by mouth daily.     aspirin EC 81 MG tablet Take 81 mg by mouth in the morning. Swallow whole.     atorvastatin (LIPITOR) 80 MG tablet Take 1 tablet (80 mg total) by mouth daily. 90 tablet 3   Calcium Carb-Cholecalciferol (CALCIUM 600/VITAMIN D PO) Take 1 tablet by mouth in the morning and at bedtime.     cetirizine (ZYRTEC) 10 MG tablet Take 10 mg by mouth daily.     Cholecalciferol (VITAMIN D3) 2000 units capsule Take 1,000-2,000 Units by mouth daily.     CRANBERRY EXTRACT PO Take 25,000 mg by mouth 2 (two) times daily.     denosumab (PROLIA) 60 MG/ML SOSY injection Inject 60 mg into the skin every 6 (six) months.     diltiazem (CARDIZEM CD) 120 MG 24 hr capsule Take 1 capsule (120 mg total) by mouth daily. 90 capsule 0   dorzolamide-timolol (COSOPT) 2-0.5 % ophthalmic solution Place 1 drop into the left eye 2 (two) times daily.     estradiol (ESTRACE) 0.1 MG/GM vaginal cream Place 1 Applicatorful vaginally once a week.     latanoprost (XALATAN) 0.005 % ophthalmic solution Place 1 drop into both eyes at bedtime.     metoprolol succinate (TOPROL-XL) 50 MG 24 hr tablet Take 25-50 mg by mouth See admin instructions. Take 50 mg by mouth at bedtime and an additional 25 mg once daily as needed for A-Fib. Take with or immediately following a meal.     mirabegron ER (MYRBETRIQ) 50 MG TB24 tablet Take 50 mg by mouth daily.     Multiple Vitamin (MULTI-VITAMINS) TABS Take 1 tablet by mouth daily with breakfast.     nitrofurantoin (MACRODANTIN)  50 MG capsule Take 50 mg by mouth daily.     omeprazole (PRILOSEC) 20 MG capsule Take 20 mg by mouth daily before breakfast.     Probiotic Product (TRUNATURE DIGESTIVE PROBIOTIC) CAPS Take 1 capsule by mouth daily.     Propylene Glycol, PF, (SYSTANE COMPLETE PF) 0.6 % SOLN 1 drop into affected eye up to every 1 hour as needed Ophthalmic Once a day     protein supplement shake (PREMIER PROTEIN) LIQD Take 11 oz by mouth daily.     SYSTANE PRESERVATIVE FREE 0.4-0.3 % SOLN Place 1 drop into the left eye 2 (two) times daily.     traZODone (DESYREL) 50 MG tablet Take 50 mg by mouth at bedtime as needed for sleep.     VITAMIN E PO Take 1 capsule by mouth daily.  methenamine (HIPREX) 1 g tablet Take 1 g by mouth 2 (two) times daily with a meal.     nitroGLYCERIN (NITROSTAT) 0.4 MG SL tablet Place 1 tablet (0.4 mg total) under the tongue every 5 (five) minutes as needed for chest pain. 25 tablet 3   polyethylene glycol (MIRALAX / GLYCOLAX) 17 g packet Take 17 g by mouth daily. 14 each 0   senna-docusate (SENOKOT-S) 8.6-50 MG tablet Take 1 tablet by mouth at bedtime.     timolol (TIMOPTIC) 0.5 % ophthalmic solution Place 1 drop into the left eye 2 (two) times daily.     Trospium Chloride 60 MG CP24 Take 60 mg by mouth in the morning.     No current facility-administered medications for this visit.   Facility-Administered Medications Ordered in Other Visits  Medication Dose Route Frequency Provider Last Rate Last Admin   indomethacin (INDOCIN) 50 MG suppository 50 mg  50 mg Rectal Once Willis Modena, MD        No Known Allergies  Social History   Socioeconomic History   Marital status: Widowed    Spouse name: Not on file   Number of children: 4   Years of education: Not on file   Highest education level: Not on file  Occupational History   Occupation: Homemaker   Occupation: [employer]   Tobacco Use   Smoking status: Former    Current packs/day: 1.00    Average  packs/day: 1 pack/day for 30.0 years (30.0 ttl pk-yrs)    Types: Cigarettes   Smokeless tobacco: Never   Tobacco comments:    quit 30 years   Vaping Use   Vaping status: Never Used  Substance and Sexual Activity   Alcohol use: Yes    Comment: 3 times a year   Drug use: Never   Sexual activity: Not Currently  Other Topics Concern   Not on file  Social History Narrative   Not on file   Social Drivers of Health   Financial Resource Strain: Low Risk  (09/15/2023)   Received [facility]   Overall Financial Resource Strain (CARDIA)    Difficulty of Paying Living Expenses: Not very hard  Food Insecurity: No Food Insecurity (09/15/2023)   Received from [facility]   Hunger Vital Sign    Worried About Running Out of Food in the Last Year: Never true    Ran Out of Food in the Last Year: Never true  Transportation Needs: No Transportation Needs (09/15/2023)   Received from [facility] - Transportation    Lack of Transportation (Medical): No    Lack of Transportation (Non-Medical): No  Physical Activity: Sufficiently Active (09/15/2023)   Received from [facility]   Exercise Vital Sign    Days of Exercise per Week: 6 days    Minutes of Exercise per Session: 30 min  Stress: No Stress Concern Present (09/15/2023)   Received from [facility] - Occupational Stress Questionnaire    Feeling of Stress : Not at all  Social Connections: Socially Integrated (09/15/2023)   Received from [facility]   Social Network    How would you rate your social network (family, work, friends)?: Good participation with social networks  Intimate Partner Violence: Patient Declined (09/15/2023)   Received from [facility]   HITS    Over the last 12 months how often did your partner physically hurt you?: Patient declined    Over the last  12 months how often did your partner insult you or talk down to you?: Patient declined    Over the last 12 months  how often did your partner threaten you with physical harm?: Patient declined    Over the last 12 months how often did your partner scream or curse at you?: Patient declined    Family History  Problem Relation Age of Onset   Heart disease Father    Breast cancer Sister    Pancreatic cancer Sister     Review of Systems:  As stated in the HPI and otherwise negative.   BP (!) 152/78   Pulse 66   Ht 5\' 2"  (1.575 m)   Wt 72.7 kg   SpO2 95%   BMI 29.30 kg/m   Physical Examination: General: Well developed, well nourished, NAD  HEENT: OP clear, mucus membranes moist  SKIN: warm, dry. No rashes. Neuro: No focal deficits  Musculoskeletal: Muscle strength 5/5 all ext  Psychiatric: Mood and affect normal  Neck: No JVD, no carotid bruits, no thyromegaly, no lymphadenopathy.  Lungs:Clear bilaterally, no wheezes, rhonci, crackles Cardiovascular: Regular rate and rhythm. No murmurs, gallops or rubs. Abdomen:Soft. Bowel sounds present. Non-tender.  Extremities: No lower extremity edema. Pulses are 2 + in the bilateral DP/PT.  EKG:  EKG is ordered today. The ekg ordered today demonstrates  EKG Interpretation Date/Time:  Wednesday November 18 2023 10:24:31 EDT Ventricular Rate:  66 PR Interval:  148 QRS Duration:  90 QT Interval:  406 QTC Calculation: 425 R Axis:   -14  Text Interpretation: Normal sinus rhythm Normal ECG Confirmed by Verne Carrow [phone]) on 11/18/2023 10:28:18 AM    Echo October 2023:  1. Left ventricular ejection fraction, by estimation, is 65 to 70%. The  left ventricle has normal function. The left ventricle has no regional  wall motion abnormalities. Left ventricular diastolic parameters are  consistent with Grade I diastolic  dysfunction (impaired relaxation).   2. Right ventricular systolic function is normal. The right ventricular  size is normal.   3. The mitral valve is normal in structure. No evidence of mitral valve  regurgitation.   4. The  aortic valve is tricuspid. There is mild calcification of the  aortic valve. There is mild thickening of the aortic valve. Aortic valve  regurgitation is not visualized. Aortic valve sclerosis is present, with  no evidence of aortic valve stenosis.   Recent Labs: No results found for requested labs within last 365 days.   Lipid Panel    Component Value Date/Time   CHOL 120 07/09/2017 0850   TRIG 207 (H) 07/09/2017 0850   HDL 42 07/09/2017 0850   CHOLHDL 2.9 07/09/2017 0850   LDLCALC 37 07/09/2017 0850     Wt Readings from Last 3 Encounters:  11/18/23 72.7 kg  11/17/22 68.6 kg  08/20/22 69 kg    Assessment and Plan:   1. CAD without angina: No chest pain. Continue ASA, beta blocker and statin.         2. Ischemic cardiomyopathy: Normal LV systolic function by echo October 2023. Continue beta blocker.   3. Hyperlipidemia: Lipids followed in primary care. LDL 54 in March 2025. Continue statin.        4. HTN: BP is controlled at home. Continue current therapy  5. Atrial fibrillation, paroxysmal: Sinus today. CHADS VASC score 6. Will continue Cardizem, Toprol and Eliquis.    Labs/ tests ordered today include:   Orders Placed This Encounter  Procedures   EKG  12-Lead   Disposition:   F/U with me in 12 months  Signed, Verne Carrow, MD 11/18/2023 12:16 PM    [facility] [address] [place], [place], [place]  [address] Phone: [phone]; Fax: [phone]

## 2023-11-18 NOTE — Patient Instructions (Signed)
 Medication Instructions:  No changes *If you need a refill on your cardiac medications before your next appointment, please call your pharmacy*  Lab Work: none  Testing/Procedures: none  Follow-Up: At [facility], you and your health needs are our priority.  As part of our continuing mission to provide you with exceptional heart care, our providers are all part of one team.  This team includes your primary Cardiologist (physician) and Advanced Practice Providers or APPs (Physician Assistants and Nurse Practitioners) who all work together to provide you with the care you need, when you need it.  Your next appointment:   12 month(s)  Provider:   Verne Carrow, MD           1st Floor: - Lobby - Registration  - Pharmacy  - Lab - Cafe  2nd Floor: - PV Lab - Diagnostic Testing (echo, CT, nuclear med)  3rd Floor: - Vacant  4th Floor: - TCTS (cardiothoracic surgery) - AFib Clinic - Structural Heart Clinic - Vascular Surgery  - Vascular Ultrasound  5th Floor: - HeartCare Cardiology (general and EP) - Clinical Pharmacy for coumadin, hypertension, lipid, weight-loss medications, and med management appointments    Valet parking services will be available as well.

## 2023-11-19 ENCOUNTER — Ambulatory Visit

## 2023-11-30 DIAGNOSIS — Z780 Asymptomatic menopausal state: Secondary | ICD-10-CM | POA: Diagnosis not present

## 2023-11-30 DIAGNOSIS — M810 Age-related osteoporosis without current pathological fracture: Secondary | ICD-10-CM | POA: Diagnosis not present

## 2023-12-15 DIAGNOSIS — Z9181 History of falling: Secondary | ICD-10-CM | POA: Diagnosis not present

## 2023-12-15 DIAGNOSIS — Z8781 Personal history of (healed) traumatic fracture: Secondary | ICD-10-CM | POA: Diagnosis not present

## 2023-12-15 DIAGNOSIS — Z5181 Encounter for therapeutic drug level monitoring: Secondary | ICD-10-CM | POA: Diagnosis not present

## 2023-12-15 DIAGNOSIS — I255 Ischemic cardiomyopathy: Secondary | ICD-10-CM | POA: Diagnosis not present

## 2023-12-15 DIAGNOSIS — M810 Age-related osteoporosis without current pathological fracture: Secondary | ICD-10-CM | POA: Diagnosis not present

## 2023-12-29 DIAGNOSIS — S72401D Unspecified fracture of lower end of right femur, subsequent encounter for closed fracture with routine healing: Secondary | ICD-10-CM | POA: Diagnosis not present

## 2023-12-29 DIAGNOSIS — S32401D Unspecified fracture of right acetabulum, subsequent encounter for fracture with routine healing: Secondary | ICD-10-CM | POA: Diagnosis not present

## 2024-01-13 ENCOUNTER — Other Ambulatory Visit: Payer: Self-pay

## 2024-01-13 ENCOUNTER — Other Ambulatory Visit: Payer: Self-pay | Admitting: Cardiovascular Disease

## 2024-01-13 DIAGNOSIS — I480 Paroxysmal atrial fibrillation: Secondary | ICD-10-CM

## 2024-01-13 MED ORDER — APIXABAN 5 MG PO TABS
5.0000 mg | ORAL_TABLET | Freq: Two times a day (BID) | ORAL | 3 refills | Status: AC
Start: 2024-01-13 — End: ?

## 2024-01-13 NOTE — Telephone Encounter (Signed)
 Prescription refill request for Eliquis  received. Indication:afib Last office visit:4/25 Scr:0.87  5/25 Age: 88 Weight:72.7  kg  Prescription refilled

## 2024-01-25 DIAGNOSIS — I255 Ischemic cardiomyopathy: Secondary | ICD-10-CM | POA: Diagnosis not present

## 2024-01-25 DIAGNOSIS — I10 Essential (primary) hypertension: Secondary | ICD-10-CM | POA: Diagnosis not present

## 2024-01-25 DIAGNOSIS — I480 Paroxysmal atrial fibrillation: Secondary | ICD-10-CM | POA: Diagnosis not present

## 2024-01-25 DIAGNOSIS — I2510 Atherosclerotic heart disease of native coronary artery without angina pectoris: Secondary | ICD-10-CM | POA: Diagnosis not present

## 2024-01-28 DIAGNOSIS — H43813 Vitreous degeneration, bilateral: Secondary | ICD-10-CM | POA: Diagnosis not present

## 2024-01-28 DIAGNOSIS — H04123 Dry eye syndrome of bilateral lacrimal glands: Secondary | ICD-10-CM | POA: Diagnosis not present

## 2024-01-28 DIAGNOSIS — H26492 Other secondary cataract, left eye: Secondary | ICD-10-CM | POA: Diagnosis not present

## 2024-01-28 DIAGNOSIS — H401122 Primary open-angle glaucoma, left eye, moderate stage: Secondary | ICD-10-CM | POA: Diagnosis not present

## 2024-02-01 DIAGNOSIS — D6869 Other thrombophilia: Secondary | ICD-10-CM | POA: Diagnosis not present

## 2024-02-01 DIAGNOSIS — J309 Allergic rhinitis, unspecified: Secondary | ICD-10-CM | POA: Diagnosis not present

## 2024-02-08 DIAGNOSIS — I255 Ischemic cardiomyopathy: Secondary | ICD-10-CM | POA: Diagnosis not present

## 2024-02-08 DIAGNOSIS — I10 Essential (primary) hypertension: Secondary | ICD-10-CM | POA: Diagnosis not present

## 2024-02-08 DIAGNOSIS — I480 Paroxysmal atrial fibrillation: Secondary | ICD-10-CM | POA: Diagnosis not present

## 2024-02-08 DIAGNOSIS — I2510 Atherosclerotic heart disease of native coronary artery without angina pectoris: Secondary | ICD-10-CM | POA: Diagnosis not present

## 2024-02-22 DIAGNOSIS — M545 Low back pain, unspecified: Secondary | ICD-10-CM | POA: Diagnosis not present

## 2024-02-23 DIAGNOSIS — I2510 Atherosclerotic heart disease of native coronary artery without angina pectoris: Secondary | ICD-10-CM | POA: Diagnosis not present

## 2024-02-23 DIAGNOSIS — I10 Essential (primary) hypertension: Secondary | ICD-10-CM | POA: Diagnosis not present

## 2024-02-23 DIAGNOSIS — I480 Paroxysmal atrial fibrillation: Secondary | ICD-10-CM | POA: Diagnosis not present

## 2024-02-23 DIAGNOSIS — I255 Ischemic cardiomyopathy: Secondary | ICD-10-CM | POA: Diagnosis not present

## 2024-03-10 DIAGNOSIS — I480 Paroxysmal atrial fibrillation: Secondary | ICD-10-CM | POA: Diagnosis not present

## 2024-03-10 DIAGNOSIS — H401122 Primary open-angle glaucoma, left eye, moderate stage: Secondary | ICD-10-CM | POA: Diagnosis not present

## 2024-03-10 DIAGNOSIS — I10 Essential (primary) hypertension: Secondary | ICD-10-CM | POA: Diagnosis not present

## 2024-03-10 DIAGNOSIS — I255 Ischemic cardiomyopathy: Secondary | ICD-10-CM | POA: Diagnosis not present

## 2024-03-10 DIAGNOSIS — I2510 Atherosclerotic heart disease of native coronary artery without angina pectoris: Secondary | ICD-10-CM | POA: Diagnosis not present

## 2024-03-23 ENCOUNTER — Ambulatory Visit: Admitting: Obstetrics

## 2024-03-24 DIAGNOSIS — I255 Ischemic cardiomyopathy: Secondary | ICD-10-CM | POA: Diagnosis not present

## 2024-03-24 DIAGNOSIS — I10 Essential (primary) hypertension: Secondary | ICD-10-CM | POA: Diagnosis not present

## 2024-03-24 DIAGNOSIS — I2510 Atherosclerotic heart disease of native coronary artery without angina pectoris: Secondary | ICD-10-CM | POA: Diagnosis not present

## 2024-03-24 DIAGNOSIS — I480 Paroxysmal atrial fibrillation: Secondary | ICD-10-CM | POA: Diagnosis not present

## 2024-04-06 ENCOUNTER — Encounter: Payer: Self-pay | Admitting: Obstetrics

## 2024-04-06 ENCOUNTER — Other Ambulatory Visit (HOSPITAL_COMMUNITY)
Admission: RE | Admit: 2024-04-06 | Discharge: 2024-04-06 | Disposition: A | Discharge status: Home / Self Care | Source: Other Acute Inpatient Hospital | Attending: Obstetrics | Admitting: Obstetrics

## 2024-04-06 ENCOUNTER — Ambulatory Visit (INDEPENDENT_AMBULATORY_CARE_PROVIDER_SITE_OTHER): Admitting: Obstetrics

## 2024-04-06 VITALS — BP 154/74 | HR 58 | Ht 61.0 in | Wt 157.0 lb

## 2024-04-06 DIAGNOSIS — N390 Urinary tract infection, site not specified: Secondary | ICD-10-CM | POA: Insufficient documentation

## 2024-04-06 DIAGNOSIS — K5900 Constipation, unspecified: Secondary | ICD-10-CM | POA: Diagnosis not present

## 2024-04-06 DIAGNOSIS — N952 Postmenopausal atrophic vaginitis: Secondary | ICD-10-CM | POA: Insufficient documentation

## 2024-04-06 DIAGNOSIS — N3941 Urge incontinence: Secondary | ICD-10-CM | POA: Insufficient documentation

## 2024-04-06 DIAGNOSIS — Z9889 Other specified postprocedural states: Secondary | ICD-10-CM | POA: Insufficient documentation

## 2024-04-06 DIAGNOSIS — R82998 Other abnormal findings in urine: Secondary | ICD-10-CM | POA: Diagnosis not present

## 2024-04-06 LAB — POCT URINALYSIS DIP (CLINITEK)
Bilirubin, UA: NEGATIVE
Bilirubin, UA: NEGATIVE
Blood, UA: NEGATIVE
Glucose, UA: NEGATIVE mg/dL
Glucose, UA: NEGATIVE mg/dL
Ketones, POC UA: NEGATIVE mg/dL
Ketones, POC UA: NEGATIVE mg/dL
Nitrite, UA: NEGATIVE
Nitrite, UA: NEGATIVE
POC PROTEIN,UA: NEGATIVE
Spec Grav, UA: 1.015 (ref 1.010–1.025)
Spec Grav, UA: 1.015 (ref 1.010–1.025)
Urobilinogen, UA: 0.2 U/dL
Urobilinogen, UA: 0.2 U/dL
pH, UA: 6 (ref 5.0–8.0)
pH, UA: 7 (ref 5.0–8.0)

## 2024-04-06 MED ORDER — GEMTESA 75 MG PO TABS
75.0000 mg | ORAL_TABLET | Freq: Every day | ORAL | Status: DC
Start: 2024-04-06 — End: 2024-05-25

## 2024-04-06 MED ORDER — GEMTESA 75 MG PO TABS: 75.0000 mg | ORAL_TABLET | Freq: Every day | ORAL | 2 refills | Status: DC

## 2024-04-06 MED ORDER — ESTRADIOL 0.1 MG/GM VA CREA: TOPICAL_CREAM | VAGINAL | 3 refills | Status: AC

## 2024-04-06 MED ORDER — PHENAZOPYRIDINE HCL 200 MG PO TABS: 200.0000 mg | ORAL_TABLET | Freq: Three times a day (TID) | ORAL | 0 refills | Status: AC | PRN

## 2024-04-06 NOTE — Progress Notes (Signed)
 New Patient Evaluation and Consultation  Referring Provider: Darol Norris, MD PCP: Rexanne Ingle, MD Date of Service: 04/06/2024  SUBJECTIVE Chief Complaint: New Patient (Initial Visit) Carrie Barnes is a 88 y.o. female here today for urinary incontinence and frequent UTIs.)  History of Present Illness: Carrie Barnes is a 88 y.o. White or Caucasian female seen in consultation at the request of Dr Darol for evaluation of urinary incontinence and recurrent UTIs.    S/p midurethral sling around 1996 by Dr. Janit at [facility] urology for stress urinary leakage, reports urinary leakage symptoms returned within 6 months.  Recurrent UTIs for the past 2 years  History of OAB, recurrent UTI, fecal incontinence, asymptomtiac stage II rectocele and vaginal atrophy managed by Dr. Darol Denies cystoscopy or urodynamic testing Reports bowel movement every 2-3 days since childhood, intermittent straining 1x/week. Currently on stool softener and uses miralax  PRN for bowel movements. Reports Bristol IV stool with type II around 2x/week. Reports diarrhea every other week. Tried prune juice, miralax  daily with diarrhea after 2 weeks Completed Macrodantoin 50mg  daily for UTI suppression 09/16/23 for around 6 months with 1 UTI, previously on hipprex daily with UTIs UTI symptoms: bladder spasms described as contractions, associated with diarrhea Denies hematuria, dysuria Denies hospitalization for kidney infection or stones Using probiotics  Pelvic floor exercises, OAB meds on mirabegron , vaginal estrogen 1g 2x/week, fiber supplementation, and surgery.  Previously on Trospium 60mg  daily due to cost, switched to mirabegron  50mg  due to clinical improvement. Patient denies difference in clinical symptoms between   Review of records significant for: Open angle glaucoma, CAD with ischemic cardiomyopathy on eliquis   Urinary Symptoms: Leaks urine with going from sitting to standing, with a full  bladder, with movement to the bathroom, and with urgency Leaks 5-8 time(s) per days with postural changes with larger volume leakage Leaks 2-3x/week with urgency Pad use: 6-8 pads per day.   Patient is bothered by UI symptoms.  Day time voids 6-8.  Nocturia: 1-2 times per night to void. Sleeps around 9:30pm, drinks 1 cup of water due to medications at bedtime Reports snoring, denies sleep apnea Reports ankle swelling managed by elevation prior to bed Voiding dysfunction:  empties bladder well.  Patient does not use a catheter to empty bladder.  When urinating, patient feels a weak stream and dribbling after finishing Drinks: 64oz water per day, 8-16oz coffee, 1 soda/tea  UTIs: 3-4 UTI's in the last year.   Denies history of blood in urine, kidney or bladder stones, pyelonephritis, bladder cancer, and kidney cancer No results found for the last 90 days.   Pelvic Organ Prolapse Symptoms:                  Patient Denies a feeling of a bulge the vaginal area.   Bowel Symptom: Bowel movements: 3-7 time(s) per week Stool consistency: soft  or loose Straining: no.  Splinting: no.  Incomplete evacuation: no.  Patient Reports accidental bowel leakage / fecal incontinence when she has diarrhea 3x/year Bowel regimen: diet and stool softener, miralax  Last colonoscopy: Date 7 yrs ago per pt, Results not available for review. Denies need for repeat due to age [facility] Colonoscopy   This patient has no relevant Health Maintenance data.     Sexual Function Sexually active: no.  Sexual orientation: Straight Pain with sex: No  Pelvic Pain Denies pelvic pain  Past Medical History:  Past Medical History:  Diagnosis Date   Cholecystitis    Coronary artery disease  a. 03/2017: 80-90% LAD stenosis (PCI/DES placement with a 2.75x16 mm Promus Premier stent). No significant stenosis along RCA or LCx.    DJD (degenerative joint disease)    GERD (gastroesophageal reflux disease)     Hyperlipidemia    Hypertension    dx s/p MI    Ischemic cardiomyopathy    Myocardial infarction (HCC) 03/17/2017   03-2017 treated at [facility]    Osteoporosis    Status post insertion of drug-eluting stent into left anterior descending (LAD) artery 03/2017     Past Surgical History:   Past Surgical History:  Procedure Laterality Date   APPENDECTOMY     CESAREAN SECTION     CHOLECYSTECTOMY N/A 11/05/2017   Procedure: LAPAROSCOPIC CHOLECYSTECTOMY WITH INTRAOPERATIVE CHOLANGIOGRAM;  Surgeon: Eletha Boas, MD;  Location: [facility];  Service: General;  Laterality: N/A;   ERCP N/A 11/11/2017   Procedure: ENDOSCOPIC RETROGRADE CHOLANGIOPANCREATOGRAPHY (ERCP);  Surgeon: Burnette Fallow, MD;  Location: [facility];  Service: Endoscopy;  Laterality: N/A;  possible   EUS N/A 11/11/2017   Procedure: UPPER ENDOSCOPIC ULTRASOUND (EUS) RADIAL;  Surgeon: Burnette Fallow, MD;  Location: [facility];  Service: Endoscopy;  Laterality: N/A;   FEMUR IM NAIL Right 07/13/2019   Procedure: INTRAMEDULLARY (IM) RETROGRADE FEMORAL NAILING;  Surgeon: Melodi Lerner, MD;  Location: [facility];  Service: Orthopedics;  Laterality: Right;    LUMBAR LAMINECTOMY/ DECOMPRESSION WITH MET-RX     OPEN REDUCTION INTERNAL FIXATION ACETABULAR FRACTURE STOPPA Right 05/14/2022   Procedure: OPEN REDUCTION INTERNAL FIXATION RIGHT ACETABULUM;  Surgeon: Kendal Franky SQUIBB, MD;  Location: [facility];  Service: Orthopedics;  Laterality: Right;   ORIF FEMUR FRACTURE Right 08/21/2022   Procedure: RIGHT OPEN REDUCTION INTERNAL FIXATION (ORIF) DISTAL FEMUR FRACTURE;  Surgeon: Kendal Franky SQUIBB, MD;  Location: [facility];  Service: Orthopedics;  Laterality: Right;   REVERSE SHOULDER ARTHROPLASTY Right 05/15/2022   Procedure: RIGHT REVERSE SHOULDER ARTHROPLASTY;  Surgeon: Sharl Selinda Dover, MD;  Location: [facility];  Service: Orthopedics;  Laterality: Right;   SHOULDER ARTHROSCOPY WITH ROTATOR CUFF REPAIR AND SUBACROMIAL DECOMPRESSION  Right 09/20/2021   Procedure: SHOULDER MINI OPEN ROTATOR CUFF REPAIR AND SUBACROMIAL DECOMPRESSION WITH POSSIBLE PATCH GRAFT;  Surgeon: Duwayne Purchase, MD;  Location: [facility];  Service: Orthopedics;  Laterality: Right;  90 MINS   stent  Left 03/2017   anterior descending ; drug eluting ; tx of MI at [facility]    TONSILLECTOMY     VAGINAL HYSTERECTOMY     vaginal sling       Past OB/GYN History: OB History  Gravida Para Term Preterm AB Living  5 4 4  1 4   SAB IAB Ectopic Multiple Live Births  1    4    # Outcome Date GA Lbr Len/2nd Weight Sex Type Anes PTL Lv  5 Term     M Vag-Spont   LIV  4 Term     F CS-LTranv   LIV  3 Term     M Vag-Spont   LIV  2 Term     F Vag-Spont   LIV  1 SAB             Vaginal deliveries: largest infant 8lb4oz,  Forceps/ Vacuum deliveries: 0, Cesarean section: 1 Menopausal: Yes, at age 18, Denies vaginal bleeding since menopause Contraception: s/p menopause and hysterectomy for AUB. Last pap smear was 25yrs ago per pt.  Any history of abnormal pap smears: no. No results found for: DIAGPAP, HPVHIGH, ADEQPAP  Medications: Patient has a current medication list which includes the following prescription(s): acetaminophen , apixaban , ascorbic acid , aspirin  ec, atorvastatin , calcium  carb-cholecalciferol , cetirizine, vitamin d3, prolia , diltiazem , dorzolamide-timolol , latanoprost , metoprolol  succinate, multi-vitamins, nitroglycerin , omeprazole, phenazopyridine , polyethylene glycol, trunature digestive probiotic, systane complete pf, protein supplement shake, systane preservative free, timolol , trazodone , gemtesa , gemtesa , vitamin e, estradiol , and trospium chloride, and the following Facility-Administered Medications: indomethacin .   Allergies: Patient has no known allergies.   Social History:  Social History   Tobacco Use   Smoking status: Former    Current packs/day: 1.00    Average packs/day: 1 pack/day for 30.0 years (30.0 ttl pk-yrs)     Types: Cigarettes   Smokeless tobacco: Never   Tobacco comments:    quit 30 years   Vaping Use   Vaping status: Never Used  Substance Use Topics   Alcohol  use: Yes    Comment: 3 times a year   Drug use: Never    Relationship status: widowed Patient lives with herself.   Patient is not employed. Regular exercise: Yes: online exercises and walking History of abuse: No  Family History:   Family History  Problem Relation Age of Onset   Heart disease Father    Breast cancer Sister    Pancreatic cancer Sister    Bladder Cancer Neg Hx    Renal cancer Neg Hx    Uterine cancer Neg Hx      Review of Systems: Review of Systems  Constitutional:  Positive for malaise/fatigue. Negative for fever and weight loss.  Respiratory:  Negative for cough, shortness of breath and wheezing.   Cardiovascular:  Negative for chest pain, palpitations and leg swelling.  Gastrointestinal:  Negative for abdominal pain and blood in stool.  Genitourinary:  Negative for hematuria.  Skin:  Negative for rash.  Neurological:  Negative for dizziness, weakness and headaches.  Endo/Heme/Allergies:  Bruises/bleeds easily.  Psychiatric/Behavioral:  Negative for depression. The patient is not nervous/anxious.      OBJECTIVE Physical Exam: Vitals:   04/06/24 0836 04/06/24 0943  BP: (!) 158/83 (!) 154/74  Pulse: 60 (!) 58  Weight: 157 lb (71.2 kg)   Height: 5' 1 (1.549 m)     Physical Exam Constitutional:      General: She is not in acute distress.    Appearance: Normal appearance.  Genitourinary:     Bladder and urethral meatus normal.     No lesions in the vagina.     Right Labia: No rash, tenderness, lesions, skin changes or Bartholin's cyst.    Left Labia: No tenderness, lesions, skin changes, Bartholin's cyst or rash.    No vaginal discharge, erythema, tenderness, bleeding, ulceration or granulation tissue.     No vaginal prolapse present.    Moderate vaginal atrophy present.     Right  Adnexa: not tender, not full and no mass present.    Left Adnexa: not tender, not full and no mass present.    Cervix is absent.     Uterus is absent.     Urethral meatus caruncle not present.    No urethral prolapse, tenderness, mass, hypermobility, discharge or stress urinary incontinence with cough stress test present.     Bladder is not tender, urgency on palpation not present and masses not present.      Pelvic Floor: Levator muscle strength is 3/5.    Levator ani not tender, obturator internus not tender, no asymmetrical contractions present and no pelvic spasms present.    Symmetrical pelvic sensation, anal wink present  and BC reflex present. Cardiovascular:     Rate and Rhythm: Normal rate.  Pulmonary:     Effort: Pulmonary effort is normal. No respiratory distress.  Abdominal:     General: There is no distension.     Palpations: Abdomen is soft. There is no mass.     Tenderness: There is no abdominal tenderness.     Hernia: No hernia is present.   Neurological:     Mental Status: She is alert.  Vitals reviewed. Exam conducted with a chaperone present.     POP-Q:   POP-Q  -2                                            Aa   -2                                           Ba  -8                                              C   1                                            Gh  4                                            Pb  9                                            tvl   -3                                            Ap  -3                                            Bp                                                 D     Post-Void Residual (PVR) by Bladder Scan: In order to evaluate bladder emptying, we discussed obtaining a postvoid residual and patient agreed to this procedure.  Procedure: The ultrasound unit was placed on the patient's abdomen in the suprapubic region after the patient had voided.    Post Void Residual - 04/06/24 0842       Post  Void Residual   Post Void Residual 46 mL  Straight Catheterization Procedure for PVR: After verbal consent was obtained from the patient for catheterization to assess bladder emptying and residual volume the urethra and surrounding tissues were prepped with betadine  and an in and out catheterization was performed.  PVR was 65mL.  Urine appeared cloudy yellow. The patient tolerated the procedure well.   Laboratory Results: Lab Results  Component Value Date   COLORU yellow 04/06/2024   CLARITYU cloudy (A) 04/06/2024   GLUCOSEUR negative 04/06/2024   BILIRUBINUR negative 04/06/2024   SPECGRAV 1.015 04/06/2024   RBCUR negative 04/06/2024   PHUR 7.0 04/06/2024   PROTEINUR NEGATIVE 05/13/2022   UROBILINOGEN 0.2 04/06/2024   LEUKOCYTESUR Small (1+) (A) 04/06/2024    Lab Results  Component Value Date   CREATININE 0.66 08/22/2022   CREATININE 0.81 08/21/2022   CREATININE 0.66 08/20/2022    No results found for: HGBA1C  Lab Results  Component Value Date   HGB 8.3 (L) 08/23/2022     ASSESSMENT AND PLAN Ms. Auzenne is a 88 y.o. with:  1. History of pelvic surgery   2. Frequent UTI   3. Constipation, unspecified constipation type   4. Vaginal atrophy   5. Urge urinary incontinence     History of pelvic surgery Assessment & Plan: - midurethral sling around 1996 by Dr. Janit at [facility] urology for stress urinary leakage - consider ROI for op report - discussed need for urodynamics prior to surgical intervention - denies history of cystoscopy or UDS   Frequent UTI Assessment & Plan: - denies UTI symptoms today - POCT UA + leuk, pending catheterized urine testing - For treatment of recurrent urinary tract infections, we discussed management of recurrent UTIs including prophylaxis with a daily low dose antibiotic, transvaginal estrogen therapy, D-mannose, and cranberry supplements.  We discussed the role of diagnostic testing such as cystoscopy and upper tract  imaging.   - continue low dose vaginal estrogen, encouraged to place in vagina 1g 2-3x/week - prior methanemine use with refractory UTI, completed 6 months of macrodantin - denies hospitalizations for pyelonephritis or kidney stones - discussed cystoscopy due to history of midurethral sling by Dr. Janit in 1996, consider ROI for op report - PVR 65mL without signs and symptoms of urinary retention - encouraged Ibuprofen use and Rx pyridium  as needed for UTI symptoms. Consider resumption of antibiotic UTI suppression if UTI recurs.  Orders: -     POCT URINALYSIS DIP (CLINITEK) -     POCT URINALYSIS DIP (CLINITEK) -     Estradiol ; Insert 1g into vagina 2-3 times a week  Dispense: 42.5 g; Refill: 3 -     Phenazopyridine  HCl; Take 1 tablet (200 mg total) by mouth 3 (three) times daily as needed for pain.  Dispense: 10 tablet; Refill: 0 -     Urine Culture; Future  Constipation, unspecified constipation type Assessment & Plan: - chronic history of constipation since childhood - BM every 2-3 days with intermittent straining and intermittent leakage with diarrhea - tried prune juice and miralax  with diarrhea - For constipation, we reviewed the importance of a better bowel regimen.  We also discussed the importance of avoiding chronic straining, as it can exacerbate her pelvic floor symptoms; we discussed treating constipation and straining prior to surgery, as postoperative straining can lead to damage to the repair and recurrence of symptoms. We discussed initiating therapy with increasing fluid intake, fiber supplementation, stool softeners, and laxatives such as miralax .  - encouraged to resume fiber supplementation for stool consistency and start squatting  position for defecation - accidental bowel leakage treatment options include anti-diarrhea medication (loperamide / Imodium  OTC or prescription lomotil), fiber supplements, physical therapy, and possible sacral neuromodulation or surgery.   -  discussed possible contamination of urine sample with diarrhea and fecal incontinence due to UTI symptoms associated with diarrhea   Vaginal atrophy Assessment & Plan: - For symptomatic vaginal atrophy options include lubrication with a water-based lubricant, personal hygiene measures and barrier protection against wetness, and estrogen replacement in the form of vaginal cream, vaginal tablets, or a time-released vaginal ring.   - continue low dose vaginal estrogen, encouraged to place into vagina in addition to urethral placement  Orders: -     Estradiol ; Insert 1g into vagina 2-3 times a week  Dispense: 42.5 g; Refill: 3  Urge urinary incontinence Assessment & Plan: - s/p midurethral sling, reports urgency and leakage started 6 months postop - history of pelvic fracture with open reduction internal fixation of right distal femur fracture in 08/21/2022 by Dr. Kendal - We discussed the symptoms of overactive bladder (OAB), which include urinary urgency, urinary frequency, nocturia, with or without urge incontinence.  While we do not know the exact etiology of OAB, several treatment options exist. We discussed management including behavioral therapy (decreasing bladder irritants, urge suppression strategies, timed voids, bladder retraining), physical therapy, medication; for refractory cases posterior tibial nerve stimulation, sacral neuromodulation, and intravesical botulinum toxin injection.  For anticholinergic medications, we discussed the potential side effects of anticholinergics including dry eyes, dry mouth, constipation, cognitive impairment and urinary retention. For Beta-3 agonist medication, we discussed the potential side effect of elevated blood pressure which is more likely to occur in individuals with uncontrolled hypertension. - discontinued mirabegron  due to BP, pt and daughter reports missing morning dose of BP medications due to appt  - Samples and Rx provided for Gemtesa  to  assess symptoms - If Gemtesa  is not covered, Rx to resume Trospium due to cost and BP, prior use to similar clinical improvement.  - encouraged fluid management and caffeine reduction - reviewed instructions for Kegel exercises  Orders: -     POCT URINALYSIS DIP (CLINITEK) -     Gemtesa ; Take 1 tablet (75 mg total) by mouth daily. LOT: [id number] EXP: 08/2027  Dispense: 14 tablet -     Gemtesa ; Take 1 tablet (75 mg total) by mouth daily.  Dispense: 30 tablet; Refill: 2 -     Urine Culture; Future  Time spent: I spent 71 minutes dedicated to the care of this patient on the date of this encounter to include pre-visit review of records, face-to-face time with the patient discussing recurrent UTI, history of midurethral sling, urgency urinary incontinence, constipation with fecal incontinence, vaginal atrophy, and post visit documentation and ordering medication/ testing.   Lianne ONEIDA Gillis, MD

## 2024-04-06 NOTE — Assessment & Plan Note (Signed)
-   midurethral sling around 1996 by Dr. Janit at [facility] urology for stress urinary leakage - consider ROI for op report - discussed need for urodynamics prior to surgical intervention - denies history of cystoscopy or UDS

## 2024-04-06 NOTE — Assessment & Plan Note (Signed)
-   For symptomatic vaginal atrophy options include lubrication with a water-based lubricant, personal hygiene measures and barrier protection against wetness, and estrogen replacement in the form of vaginal cream, vaginal tablets, or a time-released vaginal ring.   - continue low dose vaginal estrogen, encouraged to place into vagina in addition to urethral placement

## 2024-04-06 NOTE — Patient Instructions (Addendum)
 For treatment of recurrent urinary tract infections, we discussed management of recurrent UTIs including prophylaxis with a daily low dose antibiotic, transvaginal estrogen therapy, D-mannose, and cranberry supplements.  We discussed the role of diagnostic testing such as cystoscopy and upper tract imaging.     Continue vaginal atrophy 1g twice a week.   You can use Ibuprofen up to 600mg  every 6 hours and pydirium up to 3 times a day when you experience symptoms of UTI. Return to the office for bladder testing  Constipation: Our goal is to achieve formed bowel movements daily or every-other-day.  You may need to try different combinations of the following options to find what works best for you - everybody's body works differently so feel free to adjust the dosages as needed.  Some options to help maintain bowel health include:  Dietary changes (more leafy greens, vegetables and fruits; less processed foods) Fiber supplementation (Benefiber, FiberCon, Metamucil or Psyllium). Start slow and increase gradually to full dose. Over-the-counter agents such as: stool softeners (Docusate or Colace) and/or laxatives (Miralax , milk of magnesia)  Power Pudding is a natural mixture that may help your constipation.  To make blend 1 cup applesauce, 1 cup wheat bran, and 3/4 cup prune juice, refrigerate and then take 1 tablespoon daily with a large glass of water as needed.   Women should try to eat at least 21 to 25 grams of fiber a day, while men should aim for 30 to 38 grams a day. You can add fiber to your diet with food or a fiber supplement such as psyllium (metamucil), benefiber, or fibercon.   Here's a look at how much dietary fiber is found in some common foods. When buying packaged foods, check the Nutrition Facts label for fiber content. It can vary among brands.  Fruits Serving size Total fiber (grams)*  Raspberries 1 cup 8.0  Pear 1 medium 5.5  Apple, with skin 1 medium 4.5  Banana 1 medium 3.0   Orange 1 medium 3.0  Strawberries 1 cup 3.0   Vegetables Serving size Total fiber (grams)*  Green peas, boiled 1 cup 9.0  Broccoli, boiled 1 cup chopped 5.0  Turnip greens, boiled 1 cup 5.0  Brussels sprouts, boiled 1 cup 4.0  Potato, with skin, baked 1 medium 4.0  Sweet corn, boiled 1 cup 3.5  Cauliflower, raw 1 cup chopped 2.0  Carrot, raw 1 medium 1.5   Grains Serving size Total fiber (grams)*  Spaghetti, whole-wheat, cooked 1 cup 6.0  Barley, pearled, cooked 1 cup 6.0  Bran flakes 3/4 cup 5.5  Quinoa, cooked 1 cup 5.0  Oat bran muffin 1 medium 5.0  Oatmeal, instant, cooked 1 cup 5.0  Popcorn, air-popped 3 cups 3.5  Brown rice, cooked 1 cup 3.5  Bread, whole-wheat 1 slice 2.0  Bread, rye 1 slice 2.0   Legumes, nuts and seeds Serving size Total fiber (grams)*  Split peas, boiled 1 cup 16.0  Lentils, boiled 1 cup 15.5  Black beans, boiled 1 cup 15.0  Baked beans, canned 1 cup 10.0  Chia seeds 1 ounce 10.0  Almonds 1 ounce (23 nuts) 3.5  Pistachios 1 ounce (49 nuts) 3.0  Sunflower kernels 1 ounce 3.0  *Rounded to nearest 0.5 gram. Source: [employer] for [employer]	[place]    We discussed the symptoms of overactive bladder (OAB), which include urinary urgency, urinary frequency, night-time urination, with or without urge incontinence.  We discussed management including behavioral therapy (decreasing bladder irritants by  following a bladder diet, urge suppression strategies, timed voids, bladder retraining), physical therapy, medication; and for refractory cases posterior tibial nerve stimulation, sacral neuromodulation, and intravesical botulinum toxin injection.   For Beta-3 agonist medication, we discussed the potential side effect of elevated blood pressure which is more likely to occur in individuals with uncontrolled hypertension. You have been provided samples of Gemtesa  to try for your overactive bladder symptoms. Continue with your  prescription at the pharmacy if you prefer over Trospium.   If Gemtesa  is cost prohibitive, resume Trospium.   Please return for your cystoscopy.

## 2024-04-06 NOTE — Assessment & Plan Note (Signed)
-   chronic history of constipation since childhood - BM every 2-3 days with intermittent straining and intermittent leakage with diarrhea - tried prune juice and miralax  with diarrhea - For constipation, we reviewed the importance of a better bowel regimen.  We also discussed the importance of avoiding chronic straining, as it can exacerbate her pelvic floor symptoms; we discussed treating constipation and straining prior to surgery, as postoperative straining can lead to damage to the repair and recurrence of symptoms. We discussed initiating therapy with increasing fluid intake, fiber supplementation, stool softeners, and laxatives such as miralax .  - encouraged to resume fiber supplementation for stool consistency and start squatting position for defecation - accidental bowel leakage treatment options include anti-diarrhea medication (loperamide / Imodium  OTC or prescription lomotil), fiber supplements, physical therapy, and possible sacral neuromodulation or surgery.   - discussed possible contamination of urine sample with diarrhea and fecal incontinence due to UTI symptoms associated with diarrhea

## 2024-04-06 NOTE — Assessment & Plan Note (Signed)
-   s/p midurethral sling, reports urgency and leakage started 6 months postop - history of pelvic fracture with open reduction internal fixation of right distal femur fracture in 08/21/2022 by Dr. Kendal - We discussed the symptoms of overactive bladder (OAB), which include urinary urgency, urinary frequency, nocturia, with or without urge incontinence.  While we do not know the exact etiology of OAB, several treatment options exist. We discussed management including behavioral therapy (decreasing bladder irritants, urge suppression strategies, timed voids, bladder retraining), physical therapy, medication; for refractory cases posterior tibial nerve stimulation, sacral neuromodulation, and intravesical botulinum toxin injection.  For anticholinergic medications, we discussed the potential side effects of anticholinergics including dry eyes, dry mouth, constipation, cognitive impairment and urinary retention. For Beta-3 agonist medication, we discussed the potential side effect of elevated blood pressure which is more likely to occur in individuals with uncontrolled hypertension. - discontinued mirabegron  due to BP, pt and daughter reports missing morning dose of BP medications due to appt  - Samples and Rx provided for Gemtesa  to assess symptoms - If Gemtesa  is not covered, Rx to resume Trospium due to cost and BP, prior use to similar clinical improvement.  - encouraged fluid management and caffeine reduction - reviewed instructions for Kegel exercises

## 2024-04-06 NOTE — Assessment & Plan Note (Addendum)
-   denies UTI symptoms today - POCT UA + leuk, pending catheterized urine testing - For treatment of recurrent urinary tract infections, we discussed management of recurrent UTIs including prophylaxis with a daily low dose antibiotic, transvaginal estrogen therapy, D-mannose, and cranberry supplements.  We discussed the role of diagnostic testing such as cystoscopy and upper tract imaging.   - continue low dose vaginal estrogen, encouraged to place in vagina 1g 2-3x/week - prior methanemine use with refractory UTI, completed 6 months of macrodantin - denies hospitalizations for pyelonephritis or kidney stones - discussed cystoscopy due to history of midurethral sling by Dr. Janit in 1996, consider ROI for op report. Office to start PA and schedule cystoscopy - PVR 65mL without signs and symptoms of urinary retention - encouraged Ibuprofen use and Rx pyridium  as needed for UTI symptoms. Consider resumption of antibiotic UTI suppression if UTI recurs.

## 2024-04-07 ENCOUNTER — Ambulatory Visit: Payer: Self-pay | Admitting: Obstetrics

## 2024-04-08 LAB — URINE CULTURE: Culture: >=100000 — AB

## 2024-04-10 DIAGNOSIS — I10 Essential (primary) hypertension: Secondary | ICD-10-CM | POA: Diagnosis not present

## 2024-04-10 DIAGNOSIS — I2510 Atherosclerotic heart disease of native coronary artery without angina pectoris: Secondary | ICD-10-CM | POA: Diagnosis not present

## 2024-04-10 DIAGNOSIS — I255 Ischemic cardiomyopathy: Secondary | ICD-10-CM | POA: Diagnosis not present

## 2024-04-10 DIAGNOSIS — I480 Paroxysmal atrial fibrillation: Secondary | ICD-10-CM | POA: Diagnosis not present

## 2024-04-23 DIAGNOSIS — I10 Essential (primary) hypertension: Secondary | ICD-10-CM | POA: Diagnosis not present

## 2024-04-23 DIAGNOSIS — I255 Ischemic cardiomyopathy: Secondary | ICD-10-CM | POA: Diagnosis not present

## 2024-04-23 DIAGNOSIS — I2510 Atherosclerotic heart disease of native coronary artery without angina pectoris: Secondary | ICD-10-CM | POA: Diagnosis not present

## 2024-04-23 DIAGNOSIS — I480 Paroxysmal atrial fibrillation: Secondary | ICD-10-CM | POA: Diagnosis not present

## 2024-05-04 DIAGNOSIS — D485 Neoplasm of uncertain behavior of skin: Secondary | ICD-10-CM | POA: Diagnosis not present

## 2024-05-04 DIAGNOSIS — L814 Other melanin hyperpigmentation: Secondary | ICD-10-CM | POA: Diagnosis not present

## 2024-05-04 DIAGNOSIS — L821 Other seborrheic keratosis: Secondary | ICD-10-CM | POA: Diagnosis not present

## 2024-05-04 DIAGNOSIS — D229 Melanocytic nevi, unspecified: Secondary | ICD-10-CM | POA: Diagnosis not present

## 2024-05-04 DIAGNOSIS — L578 Other skin changes due to chronic exposure to nonionizing radiation: Secondary | ICD-10-CM | POA: Diagnosis not present

## 2024-05-04 DIAGNOSIS — D225 Melanocytic nevi of trunk: Secondary | ICD-10-CM | POA: Diagnosis not present

## 2024-05-05 DIAGNOSIS — Z23 Encounter for immunization: Secondary | ICD-10-CM | POA: Diagnosis not present

## 2024-05-05 DIAGNOSIS — I10 Essential (primary) hypertension: Secondary | ICD-10-CM | POA: Diagnosis not present

## 2024-05-05 DIAGNOSIS — E041 Nontoxic single thyroid nodule: Secondary | ICD-10-CM | POA: Diagnosis not present

## 2024-05-05 DIAGNOSIS — E7800 Pure hypercholesterolemia, unspecified: Secondary | ICD-10-CM | POA: Diagnosis not present

## 2024-05-05 DIAGNOSIS — I2510 Atherosclerotic heart disease of native coronary artery without angina pectoris: Secondary | ICD-10-CM | POA: Diagnosis not present

## 2024-05-05 DIAGNOSIS — I700 Atherosclerosis of aorta: Secondary | ICD-10-CM | POA: Diagnosis not present

## 2024-05-05 DIAGNOSIS — D6869 Other thrombophilia: Secondary | ICD-10-CM | POA: Diagnosis not present

## 2024-05-05 DIAGNOSIS — R739 Hyperglycemia, unspecified: Secondary | ICD-10-CM | POA: Diagnosis not present

## 2024-05-09 DIAGNOSIS — D235 Other benign neoplasm of skin of trunk: Secondary | ICD-10-CM | POA: Diagnosis not present

## 2024-05-09 DIAGNOSIS — D1801 Hemangioma of skin and subcutaneous tissue: Secondary | ICD-10-CM | POA: Diagnosis not present

## 2024-05-09 DIAGNOSIS — D239 Other benign neoplasm of skin, unspecified: Secondary | ICD-10-CM | POA: Diagnosis not present

## 2024-05-10 DIAGNOSIS — I10 Essential (primary) hypertension: Secondary | ICD-10-CM | POA: Diagnosis not present

## 2024-05-10 DIAGNOSIS — I2510 Atherosclerotic heart disease of native coronary artery without angina pectoris: Secondary | ICD-10-CM | POA: Diagnosis not present

## 2024-05-10 DIAGNOSIS — I480 Paroxysmal atrial fibrillation: Secondary | ICD-10-CM | POA: Diagnosis not present

## 2024-05-10 DIAGNOSIS — I255 Ischemic cardiomyopathy: Secondary | ICD-10-CM | POA: Diagnosis not present

## 2024-05-23 DIAGNOSIS — I480 Paroxysmal atrial fibrillation: Secondary | ICD-10-CM | POA: Diagnosis not present

## 2024-05-23 DIAGNOSIS — I255 Ischemic cardiomyopathy: Secondary | ICD-10-CM | POA: Diagnosis not present

## 2024-05-23 DIAGNOSIS — I2510 Atherosclerotic heart disease of native coronary artery without angina pectoris: Secondary | ICD-10-CM | POA: Diagnosis not present

## 2024-05-23 DIAGNOSIS — I10 Essential (primary) hypertension: Secondary | ICD-10-CM | POA: Diagnosis not present

## 2024-05-25 ENCOUNTER — Other Ambulatory Visit: Payer: Self-pay

## 2024-05-25 DIAGNOSIS — N3941 Urge incontinence: Secondary | ICD-10-CM

## 2024-05-25 MED ORDER — GEMTESA 75 MG PO TABS: 75.0000 mg | ORAL_TABLET | Freq: Every day | ORAL | 1 refills | Status: DC

## 2024-06-10 DIAGNOSIS — I10 Essential (primary) hypertension: Secondary | ICD-10-CM | POA: Diagnosis not present

## 2024-06-10 DIAGNOSIS — I480 Paroxysmal atrial fibrillation: Secondary | ICD-10-CM | POA: Diagnosis not present

## 2024-06-10 DIAGNOSIS — I255 Ischemic cardiomyopathy: Secondary | ICD-10-CM | POA: Diagnosis not present

## 2024-06-10 DIAGNOSIS — I2510 Atherosclerotic heart disease of native coronary artery without angina pectoris: Secondary | ICD-10-CM | POA: Diagnosis not present

## 2024-06-16 DIAGNOSIS — Z5181 Encounter for therapeutic drug level monitoring: Secondary | ICD-10-CM | POA: Diagnosis not present

## 2024-06-16 DIAGNOSIS — M810 Age-related osteoporosis without current pathological fracture: Secondary | ICD-10-CM | POA: Diagnosis not present

## 2024-06-22 DIAGNOSIS — I2510 Atherosclerotic heart disease of native coronary artery without angina pectoris: Secondary | ICD-10-CM | POA: Diagnosis not present

## 2024-06-22 DIAGNOSIS — I10 Essential (primary) hypertension: Secondary | ICD-10-CM | POA: Diagnosis not present

## 2024-06-22 DIAGNOSIS — I255 Ischemic cardiomyopathy: Secondary | ICD-10-CM | POA: Diagnosis not present

## 2024-06-22 DIAGNOSIS — I480 Paroxysmal atrial fibrillation: Secondary | ICD-10-CM | POA: Diagnosis not present

## 2024-06-29 ENCOUNTER — Telehealth: Payer: Self-pay

## 2024-06-29 NOTE — Telephone Encounter (Signed)
 Carrie Barnes is called to provide pre-procedural instructions for her [] Cystoscopy, [] Bladder Botox or [] Urethral Bulking. She is scheduled on 06/30/2024.  The patient is not having worsening urinary symptoms to her urinary patterns. The patient is not experiencing a UTI. [] The patient is scheduled to come in 2-3 days prior for a non-provider visit to run a POC Urinalysis.  [] The patient cannot come in for a pre-procedural Urinalysis therefore discussed with provider to determine treatment prior to procedure.   [x] The patient has not been prescribed the pre-procedural antibiotics.  [] The patient has not previously been prescribed the pre-procedural anitbiotics, review with patient her medication allergies to determine the antibiotic prescription needed.  [] Since no allergies, a prescription for Bactrim DS 800mg  #2, take 1 the morning of the procedure and 1 the morning after the procedure and one the morning after the procedure.  [] Since the patient is allergic to Sulfa, the patient will be prescribed Macrobid 100mg  #2, take 1 the morning of the procedure and 1 the morning after the procedure. The patient is reminded to arrive 5 minutes prior to the scheduled appointment time and that a urine sample will need to be collected the upon arrival for the procedure.

## 2024-06-30 ENCOUNTER — Ambulatory Visit: Payer: Self-pay | Admitting: Obstetrics

## 2024-06-30 ENCOUNTER — Other Ambulatory Visit (HOSPITAL_COMMUNITY)
Admission: RE | Admit: 2024-06-30 | Discharge: 2024-06-30 | Disposition: A | Discharge status: Home / Self Care | Attending: Obstetrics | Admitting: Obstetrics

## 2024-06-30 ENCOUNTER — Ambulatory Visit: Admitting: Obstetrics

## 2024-06-30 ENCOUNTER — Encounter: Payer: Self-pay | Admitting: Obstetrics

## 2024-06-30 VITALS — BP 144/69 | HR 58

## 2024-06-30 DIAGNOSIS — Z9889 Other specified postprocedural states: Secondary | ICD-10-CM | POA: Diagnosis not present

## 2024-06-30 DIAGNOSIS — Z87440 Personal history of urinary (tract) infections: Secondary | ICD-10-CM | POA: Diagnosis not present

## 2024-06-30 DIAGNOSIS — N390 Urinary tract infection, site not specified: Secondary | ICD-10-CM

## 2024-06-30 LAB — POCT URINALYSIS DIP (CLINITEK)
Bilirubin, UA: NEGATIVE
Glucose, UA: NEGATIVE mg/dL
Ketones, POC UA: NEGATIVE mg/dL
Nitrite, UA: NEGATIVE
POC PROTEIN,UA: NEGATIVE
Spec Grav, UA: 1.01 (ref 1.010–1.025)
Urobilinogen, UA: 0.2 U/dL
pH, UA: 6 (ref 5.0–8.0)

## 2024-06-30 LAB — URINALYSIS, COMPLETE (UACMP) WITH MICROSCOPIC
Bilirubin Urine: NEGATIVE
Glucose, UA: NEGATIVE mg/dL
Hgb urine dipstick: NEGATIVE
Ketones, ur: NEGATIVE mg/dL
Nitrite: NEGATIVE
Protein, ur: NEGATIVE mg/dL
Specific Gravity, Urine: 1.009 (ref 1.005–1.030)
pH: 6 (ref 5.0–8.0)

## 2024-06-30 NOTE — Patient Instructions (Addendum)
 Taking Care of Yourself after Urodynamics, Cystoscopy, Bulkamid Injection, or Botox Injection   Drink plenty of water for a day or two following your procedure. Try to have about 8 ounces (one cup) at a time, and do this 6 times or more per day unless you have fluid restrictitons AVOID irritative beverages such as coffee, tea, soda, alcoholic or citrus drinks for a day or two, as this may cause burning with urination.  For the first 1-2 days after the procedure, your urine may be pink or red in color. You may have some blood in your urine as a normal side effect of the procedure. Large amounts of bleeding or difficulty urinating are NOT normal. Call the nurse line if this happens or go to the nearest Emergency Room if the bleeding is heavy or you cannot urinate at all and it is after hours. If you had a Bulkamid injection in the urethra and need to be catheterized, ask for a pediatric catheter to be used (size 10 or 12-French) so the material is not pushed out of place.   You may experience some discomfort or a burning sensation with urination after having this procedure. You can use over the counter Azo or pyridium  to help with burning and follow the instructions on the packaging. If it does not improve within 1-2 days, or other symptoms appear (fever, chills, or difficulty urinating) call the office to speak to a nurse.  You may return to normal daily activities such as work, school, driving, exercising and housework on the day of the procedure. If your doctor gave you a prescription, take it as ordered.     Continue vaginal estrogen 1g 2-3 times a week.   Continue Gemtesa  1 tab daily.

## 2024-06-30 NOTE — Progress Notes (Signed)
 CYSTOSCOPY  CC:  This is a 88 y.o. with recurrent UTI and history of midurethral sling who presents today for cystoscopy.  Continue vaginal estrogen Trial of Gemtesa  with reduction of UUI to 3 pads/day, reports 85% improvement of urinary symptoms. Denies UTI symptoms today.  Lab Results  Component Value Date   COLORU yellow 06/30/2024   CLARITYU clear 06/30/2024   GLUCOSEUR negative 06/30/2024   BILIRUBINUR negative 06/30/2024   SPECGRAV 1.010 06/30/2024   RBCUR trace-intact (A) 06/30/2024   PHUR 6.0 06/30/2024   PROTEINUR NEGATIVE 05/13/2022   UROBILINOGEN 0.2 06/30/2024   LEUKOCYTESUR Small (1+) (A) 06/30/2024   BP (!) 144/69   Pulse (!) 58   CYSTOSCOPY: A time out was performed.  The periurethral area was prepped and draped in a sterile manner.  2% lidocaine  jetpack was inserted at the urethral meatus. The urethra and bladder were visualized with flexible cystoscope.  She had normal urethral coaptation and normal urethral mucosa.  She had normal bladder mucosa. She had bilateral clear efflux from both ureteral orifices.  She had no squamous metaplasia at the trigone, no trabeculations, cellules or diverticuli.     ASSESSMENT:  88 y.o. with recurrent UTI and history of midurethral sling. Cystoscopy today is normal.  PLAN:  Follow-up to discuss findings and treatment options.  All questions answered and post-procedures instructions were given - continue vaginal estrogen 1g 2-3x/week for rUTI - return if clinical change - continue Gemtesa  daily due to relief, declines additional treatments at this time - pending UA micro and culture.  Lianne ONEIDA Gillis, MD

## 2024-07-01 ENCOUNTER — Other Ambulatory Visit: Payer: Self-pay | Admitting: Obstetrics

## 2024-07-01 DIAGNOSIS — N3941 Urge incontinence: Secondary | ICD-10-CM

## 2024-07-01 MED ORDER — GEMTESA 75 MG PO TABS: 75.0000 mg | ORAL_TABLET | Freq: Every day | ORAL | 11 refills | Status: DC

## 2024-07-02 LAB — URINE CULTURE: Culture: >=100000 — AB

## 2024-07-04 ENCOUNTER — Ambulatory Visit: Admitting: Obstetrics

## 2024-07-10 DIAGNOSIS — I10 Essential (primary) hypertension: Secondary | ICD-10-CM | POA: Diagnosis not present

## 2024-07-10 DIAGNOSIS — I255 Ischemic cardiomyopathy: Secondary | ICD-10-CM | POA: Diagnosis not present

## 2024-07-10 DIAGNOSIS — I480 Paroxysmal atrial fibrillation: Secondary | ICD-10-CM | POA: Diagnosis not present

## 2024-07-10 DIAGNOSIS — I2510 Atherosclerotic heart disease of native coronary artery without angina pectoris: Secondary | ICD-10-CM | POA: Diagnosis not present

## 2024-07-13 DIAGNOSIS — H401122 Primary open-angle glaucoma, left eye, moderate stage: Secondary | ICD-10-CM | POA: Diagnosis not present

## 2024-07-13 DIAGNOSIS — H34812 Central retinal vein occlusion, left eye, with macular edema: Secondary | ICD-10-CM | POA: Diagnosis not present

## 2024-07-13 DIAGNOSIS — H2000 Unspecified acute and subacute iridocyclitis: Secondary | ICD-10-CM | POA: Diagnosis not present

## 2024-07-25 DIAGNOSIS — H40052 Ocular hypertension, left eye: Secondary | ICD-10-CM | POA: Diagnosis not present

## 2024-07-25 DIAGNOSIS — H34812 Central retinal vein occlusion, left eye, with macular edema: Secondary | ICD-10-CM | POA: Diagnosis not present

## 2024-07-28 DIAGNOSIS — H401122 Primary open-angle glaucoma, left eye, moderate stage: Secondary | ICD-10-CM | POA: Diagnosis not present

## 2024-07-28 DIAGNOSIS — H2000 Unspecified acute and subacute iridocyclitis: Secondary | ICD-10-CM | POA: Diagnosis not present

## 2024-07-29 DIAGNOSIS — H401123 Primary open-angle glaucoma, left eye, severe stage: Secondary | ICD-10-CM | POA: Diagnosis not present

## 2024-08-22 ENCOUNTER — Encounter: Payer: Self-pay | Admitting: *Deleted

## 2024-09-02 ENCOUNTER — Telehealth: Payer: Self-pay

## 2024-09-02 NOTE — Telephone Encounter (Signed)
 Patient contacted our office to inform us  her medication is now $753.00 and she can no longer afford it. Sh is willing to start Myrbetriq  if it cheaper. She rather get help lower the cost of Gemtesa  since stating it works very well for her. Please advise.

## 2024-09-06 ENCOUNTER — Other Ambulatory Visit: Payer: Self-pay | Admitting: Obstetrics

## 2024-09-06 DIAGNOSIS — N3941 Urge incontinence: Secondary | ICD-10-CM

## 2024-09-06 MED ORDER — GEMTESA 75 MG PO TABS: 75.0000 mg | ORAL_TABLET | Freq: Every day | ORAL | 2 refills | Status: AC

## 2024-09-06 NOTE — Progress Notes (Signed)
 Rx gemtesa  sent to [facility] for cost.  If not covered, please schedule follow-up appt to review options

## 2024-09-14 NOTE — Telephone Encounter (Signed)
Left message to contact

## 2024-09-15 NOTE — Telephone Encounter (Signed)
 Pt called back and stated has heard from [facility] about prescription.  She just wanted to let us  know. Sotero CMA

## 2024-11-24 ENCOUNTER — Ambulatory Visit: Admitting: Cardiovascular Disease
